# Patient Record
Sex: Female | Born: 1940
Health system: Southern US, Community
[De-identification: ages and names within clinical notes are randomized; demographics above are authoritative.]

## PROBLEM LIST (undated history)

## (undated) DIAGNOSIS — M7122 Synovial cyst of popliteal space [Baker], left knee: Secondary | ICD-10-CM

## (undated) DIAGNOSIS — I739 Peripheral vascular disease, unspecified: Secondary | ICD-10-CM

## (undated) DIAGNOSIS — Z872 Personal history of diseases of the skin and subcutaneous tissue: Secondary | ICD-10-CM

## (undated) DIAGNOSIS — I4891 Unspecified atrial fibrillation: Secondary | ICD-10-CM

## (undated) DIAGNOSIS — K219 Gastro-esophageal reflux disease without esophagitis: Secondary | ICD-10-CM

## (undated) DIAGNOSIS — R112 Nausea with vomiting, unspecified: Secondary | ICD-10-CM

## (undated) DIAGNOSIS — N189 Chronic kidney disease, unspecified: Secondary | ICD-10-CM

## (undated) DIAGNOSIS — Z87898 Personal history of other specified conditions: Secondary | ICD-10-CM

## (undated) DIAGNOSIS — K5900 Constipation, unspecified: Secondary | ICD-10-CM

## (undated) DIAGNOSIS — Z9889 Other specified postprocedural states: Secondary | ICD-10-CM

## (undated) DIAGNOSIS — M1990 Unspecified osteoarthritis, unspecified site: Secondary | ICD-10-CM

## (undated) DIAGNOSIS — I10 Essential (primary) hypertension: Secondary | ICD-10-CM

## (undated) DIAGNOSIS — F419 Anxiety disorder, unspecified: Secondary | ICD-10-CM

## (undated) DIAGNOSIS — R011 Cardiac murmur, unspecified: Secondary | ICD-10-CM

## (undated) DIAGNOSIS — Z8719 Personal history of other diseases of the digestive system: Secondary | ICD-10-CM

## (undated) DIAGNOSIS — K922 Gastrointestinal hemorrhage, unspecified: Secondary | ICD-10-CM

## (undated) DIAGNOSIS — D509 Iron deficiency anemia, unspecified: Secondary | ICD-10-CM

## (undated) DIAGNOSIS — I499 Cardiac arrhythmia, unspecified: Secondary | ICD-10-CM

## (undated) DIAGNOSIS — R911 Solitary pulmonary nodule: Secondary | ICD-10-CM

## (undated) HISTORY — PX: JOINT REPLACEMENT: SHX530

## (undated) HISTORY — PX: KNEE ARTHROSCOPY: SUR90

## (undated) HISTORY — PX: TUBAL LIGATION: SHX77

## (undated) HISTORY — PX: EYE SURGERY: SHX253

## (undated) HISTORY — PX: OTHER SURGICAL HISTORY: SHX169

## (undated) HISTORY — PX: INCONTINENCE SURGERY: SHX676

---
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
## 1975-08-29 HISTORY — PX: VAGINAL HYSTERECTOMY: SHX2639

## 1998-05-19 ENCOUNTER — Other Ambulatory Visit: Admission: RE | Admit: 1998-05-19 | Discharge: 1998-05-19 | Payer: Self-pay | Admitting: Obstetrics & Gynecology

## 1999-06-10 ENCOUNTER — Encounter: Admission: RE | Admit: 1999-06-10 | Discharge: 1999-06-10 | Payer: Self-pay | Admitting: Obstetrics and Gynecology

## 1999-12-06 ENCOUNTER — Ambulatory Visit (HOSPITAL_COMMUNITY): Admission: RE | Admit: 1999-12-06 | Discharge: 1999-12-06 | Payer: Self-pay | Admitting: Endocrinology

## 2000-06-13 ENCOUNTER — Encounter: Payer: Self-pay | Admitting: Obstetrics and Gynecology

## 2000-06-13 ENCOUNTER — Encounter: Admission: RE | Admit: 2000-06-13 | Discharge: 2000-06-13 | Payer: Self-pay | Admitting: Obstetrics and Gynecology

## 2000-07-02 ENCOUNTER — Other Ambulatory Visit: Admission: RE | Admit: 2000-07-02 | Discharge: 2000-07-02 | Payer: Self-pay | Admitting: Obstetrics and Gynecology

## 2001-06-17 ENCOUNTER — Encounter: Admission: RE | Admit: 2001-06-17 | Discharge: 2001-06-17 | Payer: Self-pay | Admitting: Obstetrics and Gynecology

## 2001-06-17 ENCOUNTER — Encounter: Payer: Self-pay | Admitting: Obstetrics and Gynecology

## 2001-07-05 ENCOUNTER — Other Ambulatory Visit: Admission: RE | Admit: 2001-07-05 | Discharge: 2001-07-05 | Payer: Self-pay | Admitting: Obstetrics and Gynecology

## 2001-12-24 ENCOUNTER — Encounter: Admission: RE | Admit: 2001-12-24 | Discharge: 2002-03-24 | Payer: Self-pay | Admitting: Family Medicine

## 2002-01-31 ENCOUNTER — Encounter: Payer: Self-pay | Admitting: Obstetrics and Gynecology

## 2002-01-31 ENCOUNTER — Encounter: Admission: RE | Admit: 2002-01-31 | Discharge: 2002-01-31 | Payer: Self-pay | Admitting: Obstetrics and Gynecology

## 2003-01-14 ENCOUNTER — Other Ambulatory Visit: Admission: RE | Admit: 2003-01-14 | Discharge: 2003-01-14 | Payer: Self-pay | Admitting: Obstetrics and Gynecology

## 2003-03-24 ENCOUNTER — Encounter: Payer: Self-pay | Admitting: Obstetrics and Gynecology

## 2003-03-24 ENCOUNTER — Encounter: Admission: RE | Admit: 2003-03-24 | Discharge: 2003-03-24 | Payer: Self-pay | Admitting: Obstetrics and Gynecology

## 2003-06-29 ENCOUNTER — Encounter (INDEPENDENT_AMBULATORY_CARE_PROVIDER_SITE_OTHER): Payer: Self-pay | Admitting: Specialist

## 2003-06-29 ENCOUNTER — Inpatient Hospital Stay (HOSPITAL_COMMUNITY): Admission: RE | Admit: 2003-06-29 | Discharge: 2003-07-01 | Payer: Self-pay | Admitting: Obstetrics and Gynecology

## 2003-07-02 ENCOUNTER — Inpatient Hospital Stay (HOSPITAL_COMMUNITY): Admission: AD | Admit: 2003-07-02 | Discharge: 2003-07-04 | Payer: Self-pay | Admitting: Obstetrics and Gynecology

## 2004-04-18 ENCOUNTER — Encounter: Admission: RE | Admit: 2004-04-18 | Discharge: 2004-04-18 | Payer: Self-pay | Admitting: Obstetrics and Gynecology

## 2004-05-24 ENCOUNTER — Encounter: Admission: RE | Admit: 2004-05-24 | Discharge: 2004-05-24 | Payer: Self-pay | Admitting: Family Medicine

## 2004-07-13 ENCOUNTER — Ambulatory Visit: Payer: Self-pay | Admitting: Internal Medicine

## 2004-07-14 ENCOUNTER — Ambulatory Visit: Payer: Self-pay | Admitting: Internal Medicine

## 2004-07-15 ENCOUNTER — Ambulatory Visit: Payer: Self-pay | Admitting: Internal Medicine

## 2004-08-15 ENCOUNTER — Ambulatory Visit: Payer: Self-pay | Admitting: Internal Medicine

## 2004-09-02 ENCOUNTER — Inpatient Hospital Stay (HOSPITAL_COMMUNITY): Admission: RE | Admit: 2004-09-02 | Discharge: 2004-09-06 | Payer: Self-pay | Admitting: Specialist

## 2004-09-02 ENCOUNTER — Ambulatory Visit: Payer: Self-pay | Admitting: Physical Medicine & Rehabilitation

## 2005-05-16 ENCOUNTER — Encounter: Admission: RE | Admit: 2005-05-16 | Discharge: 2005-05-16 | Payer: Self-pay | Admitting: Obstetrics and Gynecology

## 2005-08-10 ENCOUNTER — Ambulatory Visit (HOSPITAL_COMMUNITY): Admission: RE | Admit: 2005-08-10 | Discharge: 2005-08-10 | Payer: Self-pay | Admitting: Family Medicine

## 2005-10-02 ENCOUNTER — Ambulatory Visit: Payer: Self-pay | Admitting: Pulmonary Disease

## 2005-10-05 ENCOUNTER — Ambulatory Visit: Payer: Self-pay | Admitting: *Deleted

## 2005-10-11 ENCOUNTER — Ambulatory Visit: Admission: RE | Admit: 2005-10-11 | Discharge: 2005-10-11 | Payer: Self-pay | Admitting: Pulmonary Disease

## 2005-10-11 ENCOUNTER — Ambulatory Visit: Payer: Self-pay | Admitting: Acute Care

## 2005-11-27 ENCOUNTER — Ambulatory Visit: Payer: Self-pay | Admitting: Pulmonary Disease

## 2006-01-12 ENCOUNTER — Ambulatory Visit: Payer: Self-pay | Admitting: Pulmonary Disease

## 2006-06-08 ENCOUNTER — Ambulatory Visit: Payer: Self-pay | Admitting: Pulmonary Disease

## 2006-06-28 ENCOUNTER — Encounter: Admission: RE | Admit: 2006-06-28 | Discharge: 2006-06-28 | Payer: Self-pay | Admitting: Family Medicine

## 2006-12-19 ENCOUNTER — Encounter: Admission: RE | Admit: 2006-12-19 | Discharge: 2007-03-19 | Payer: Self-pay | Admitting: Family Medicine

## 2007-02-20 ENCOUNTER — Ambulatory Visit (HOSPITAL_COMMUNITY): Admission: RE | Admit: 2007-02-20 | Discharge: 2007-02-21 | Payer: Self-pay | Admitting: Orthopedic Surgery

## 2007-10-11 ENCOUNTER — Encounter: Admission: RE | Admit: 2007-10-11 | Discharge: 2007-10-11 | Payer: Self-pay | Admitting: Family Medicine

## 2007-10-24 ENCOUNTER — Encounter: Admission: RE | Admit: 2007-10-24 | Discharge: 2007-10-24 | Payer: Self-pay | Admitting: Family Medicine

## 2008-07-16 ENCOUNTER — Inpatient Hospital Stay (HOSPITAL_COMMUNITY): Admission: RE | Admit: 2008-07-16 | Discharge: 2008-07-17 | Payer: Self-pay | Admitting: Orthopedic Surgery

## 2008-08-10 ENCOUNTER — Inpatient Hospital Stay (HOSPITAL_COMMUNITY): Admission: EM | Admit: 2008-08-10 | Discharge: 2008-08-12 | Payer: Self-pay | Admitting: Emergency Medicine

## 2008-08-28 HISTORY — PX: HAND SURGERY: SHX662

## 2008-12-09 ENCOUNTER — Encounter: Admission: RE | Admit: 2008-12-09 | Discharge: 2008-12-09 | Payer: Self-pay | Admitting: Family Medicine

## 2010-03-11 ENCOUNTER — Encounter: Admission: RE | Admit: 2010-03-11 | Discharge: 2010-03-11 | Payer: Self-pay | Admitting: Internal Medicine

## 2010-07-26 ENCOUNTER — Ambulatory Visit (HOSPITAL_COMMUNITY)
Admission: RE | Admit: 2010-07-26 | Discharge: 2010-07-26 | Payer: Self-pay | Source: Home / Self Care | Admitting: Orthopedic Surgery

## 2010-08-04 ENCOUNTER — Ambulatory Visit (HOSPITAL_COMMUNITY)
Admission: RE | Admit: 2010-08-04 | Discharge: 2010-08-05 | Payer: Self-pay | Source: Home / Self Care | Attending: Orthopedic Surgery | Admitting: Orthopedic Surgery

## 2010-11-07 LAB — GLUCOSE, CAPILLARY
Glucose-Capillary: 111 mg/dL — ABNORMAL HIGH (ref 70–99)
Glucose-Capillary: 137 mg/dL — ABNORMAL HIGH (ref 70–99)
Glucose-Capillary: 178 mg/dL — ABNORMAL HIGH (ref 70–99)
Glucose-Capillary: 98 mg/dL (ref 70–99)

## 2010-11-08 LAB — BASIC METABOLIC PANEL
BUN: 17 mg/dL (ref 6–23)
Chloride: 101 mEq/L (ref 96–112)
Creatinine, Ser: 1.12 mg/dL (ref 0.4–1.2)
Glucose, Bld: 175 mg/dL — ABNORMAL HIGH (ref 70–99)
Potassium: 4.5 mEq/L (ref 3.5–5.1)

## 2010-11-08 LAB — CBC
HCT: 37.6 % (ref 36.0–46.0)
MCHC: 32.2 g/dL (ref 30.0–36.0)
MCV: 87 fL (ref 78.0–100.0)
Platelets: 256 10*3/uL (ref 150–400)
RDW: 13.5 % (ref 11.5–15.5)

## 2010-12-07 ENCOUNTER — Encounter (HOSPITAL_COMMUNITY)
Admission: RE | Admit: 2010-12-07 | Discharge: 2010-12-07 | Disposition: A | Discharge status: Home / Self Care | Payer: Medicare Other | Source: Ambulatory Visit | Attending: Orthopedic Surgery | Admitting: Orthopedic Surgery

## 2010-12-07 LAB — CBC
HCT: 38.4 % (ref 36.0–46.0)
MCH: 27.9 pg (ref 26.0–34.0)
MCHC: 32 g/dL (ref 30.0–36.0)
MCV: 87.1 fL (ref 78.0–100.0)
Platelets: 227 10*3/uL (ref 150–400)
RDW: 13.5 % (ref 11.5–15.5)

## 2010-12-07 LAB — BASIC METABOLIC PANEL
BUN: 21 mg/dL (ref 6–23)
Calcium: 9.7 mg/dL (ref 8.4–10.5)
Creatinine, Ser: 1.05 mg/dL (ref 0.4–1.2)
GFR calc non Af Amer: 52 mL/min — ABNORMAL LOW (ref >60)
Glucose, Bld: 206 mg/dL — ABNORMAL HIGH (ref 70–99)

## 2010-12-15 ENCOUNTER — Ambulatory Visit (HOSPITAL_COMMUNITY): Admission: RE | Admit: 2010-12-15 | Payer: Medicare Other | Source: Ambulatory Visit | Admitting: Orthopedic Surgery

## 2010-12-19 ENCOUNTER — Encounter (HOSPITAL_BASED_OUTPATIENT_CLINIC_OR_DEPARTMENT_OTHER)
Admission: RE | Admit: 2010-12-19 | Discharge: 2010-12-19 | Disposition: A | Discharge status: Home / Self Care | Payer: Medicare Other | Source: Ambulatory Visit | Attending: Orthopedic Surgery | Admitting: Orthopedic Surgery

## 2010-12-19 LAB — BASIC METABOLIC PANEL
BUN: 21 mg/dL (ref 6–23)
CO2: 28 mEq/L (ref 19–32)
Calcium: 9.1 mg/dL (ref 8.4–10.5)
Creatinine, Ser: 1.2 mg/dL (ref 0.4–1.2)
Glucose, Bld: 140 mg/dL — ABNORMAL HIGH (ref 70–99)
Sodium: 135 mEq/L (ref 135–145)

## 2010-12-21 ENCOUNTER — Ambulatory Visit (HOSPITAL_BASED_OUTPATIENT_CLINIC_OR_DEPARTMENT_OTHER)
Admission: RE | Admit: 2010-12-21 | Discharge: 2010-12-21 | Disposition: A | Discharge status: Home / Self Care | Payer: Medicare Other | Source: Ambulatory Visit | Attending: Orthopedic Surgery | Admitting: Orthopedic Surgery

## 2010-12-21 DIAGNOSIS — Z01812 Encounter for preprocedural laboratory examination: Secondary | ICD-10-CM | POA: Insufficient documentation

## 2010-12-21 DIAGNOSIS — G609 Hereditary and idiopathic neuropathy, unspecified: Secondary | ICD-10-CM | POA: Insufficient documentation

## 2010-12-21 DIAGNOSIS — M19049 Primary osteoarthritis, unspecified hand: Secondary | ICD-10-CM | POA: Insufficient documentation

## 2010-12-21 DIAGNOSIS — E119 Type 2 diabetes mellitus without complications: Secondary | ICD-10-CM | POA: Insufficient documentation

## 2010-12-21 DIAGNOSIS — I10 Essential (primary) hypertension: Secondary | ICD-10-CM | POA: Insufficient documentation

## 2010-12-21 LAB — GLUCOSE, CAPILLARY: Glucose-Capillary: 132 mg/dL — ABNORMAL HIGH (ref 70–99)

## 2011-01-03 NOTE — Op Note (Signed)
[name], ELLINGER NO.:  [id number]  MEDICAL RECORD NO.:  [id number]          PATIENT TYPE:  LOCATION:                                 FACILITY:  PHYSICIAN:  Nancy Done, MD  DATE OF BIRTH:  04/11/1941  DATE OF PROCEDURE:  12/21/2010 DATE OF DISCHARGE:                              OPERATIVE REPORT   PREOPERATIVE DIAGNOSIS:  Left ring finger end-stage osteoarthritis of the proximal interphalangeal joint.  POSTOPERATIVE DIAGNOSIS:  Left ring finger end-stage osteoarthritis of the proximal interphalangeal joint.  ATTENDING SURGEON:  Sharma Covert IV, MD who was scrubbed and present for the entire procedure.  ASSISTANT SURGEON:  None.  SURGICAL PROCEDURES: 1. Left ring finger proximal interphalangeal joint arthroplasty. 2. Radiographs, two views left index finger.  SURGICAL IMPLANT: NeuFlex size 1 PIP joint implant.  SURGICAL INDICATION:  Mr. [name] is a 70 year old right-hand dominant female with advanced osteoarthritis of her left ring finger.  The patient failed nonsurgical treatment options and elected to undergo the above procedure.  The patient has [name] well after her index finger arthroplasty.  Risks, benefits, and alternatives were discussed in detail with the patient and signed informed consent was obtained.  Risks include but not limited to bleeding; infection; damage to nearby nerves, arteries, or tendons; loss of motion of the wrist and digits; persistent pain and limitations of the finger; and need for further surgical intervention.  DESCRIPTION OF PROCEDURE:  The patient was properly identified in the preoperative holding area and marked with permanent marker made on left ring to indicate correct operative site.  The patient was then brought back to the operating room, placed supine on the anesthesia room table where general anesthesia was administered.  The patient tolerated this well.  Well-padded tourniquet was then placed  no the left brachium and sealed with 1000 drape.  Left upper extremity was then prepped and draped in normal sterile fashion.  Time-out was called, correct side was identified, and the procedure was then begun.  Attention was then turned to the left index finger where volar approach was then made to the finger.  A volar Brunner incision was made centered at the PIP joint. Radial and ulnar neurovascular bundles were identified.  Full-thickness skin flaps were identified and raised out the flexor sheath.  The flexor sheath was incised transversely just distal to the A2 pulley proximal to the fourth annular pulley, and reflected laterally.  A small drain was then used to retract the flexor tendons.  The palmar plane was then released from the proximal phalanx, and the collateral ligaments were then recessed from the proximal phalangeal attachment.  The joint was then opened with hyperextension exposing the joint surfaces.  The patient did have a large osteophytes off the middle phalanx and the dorsal region of the middle phalanx.  The patient had no articular cartilage of the proximal phalanx.  The patient also had surface defects along the volar margin of the middle phalanx.  An oscillating saw was then used to resect the proximal phalangeal condyle perpendicular to long axis of the bone removing a bundle off the implant.  Base  of the middle phalanx was not resected.  The intramedullary canal was then entered with an awl, and each were then enlarged with broaches to the appropriate size.  This was a size 1.  I could not pass anything more than the size 1.  Small bur and oscillating and a drill was then used to open up the canal.  This was very small and very tight canal.  The trial implant was then placed in active range of motion and stability were then assessed.  After placement of the trial implant, it was felt to be in good position.  A permanent plan was then carefully handled with  saline and no-touch technique, and radiographs were obtained to show the alignment.  The volar plate was then reapproximated with portions of the radial collateral ligament.  The tourniquet was then released.  This was repaired using a 4-0 Ethibond suture.  The wound was then thoroughly irrigated.  Hemostasis was obtained.  The skin was then closed using a 4-0 nylon suture.  A bulky dressing with a dorsal blocking splint were then applied.  Intraoperative radiographs, two views of the finger did show the surgical implant in place, and is in relatively good position in both planes.  POSTOPERATIVE PLAN:  The patient will be discharged home, seen back for first postop visit where gutter splint will be then fashioned.  Holding the digit in full extension, the lateral side of the splint will made higher on the radial side to prevent stress of the joint.  Active and passive range of motion excising will be performed several times a day. We will consider transitioning to a Dynamic flexion splint as her mobility improves.  Every week mark exercises are increased to provide mobility and gentle strengthening around 8 weeks, and radiographs at the first visit.     Nancy Done, MD     FWO/MEDQ  D:  12/21/2010  T:  12/22/2010  Job:  [id number]  Electronically Signed by Bradly Bienenstock IV MD on 01/03/2011 09:07:07 AM

## 2011-01-10 NOTE — H&P (Signed)
[name], [name] NO.:  [id number]   MEDICAL RECORD NO.:  [id number]          PATIENT TYPE:  OBV   LOCATION:  1823                         FACILITY:  [facility]   PHYSICIAN:  Ramiro Harvest, MD    DATE OF BIRTH:  [birth year]   DATE OF ADMISSION:  08/10/2008  DATE OF DISCHARGE:                              HISTORY & PHYSICAL   PRIMARY CARE PHYSICIAN:  Dr. Abigail Miyamoto of [facility].   HISTORY OF PRESENT ILLNESS:  [name] is a 70 year old white  female history of type 2 diabetes on insulin, hypertension,  hyperlipidemia, GERD, asthma, osteoarthritis with a neuropathy who  presents to the ED with altered mental status and nausea and agitation  per husband.  The patient's symptoms started around 10 p.m. the night  prior to admission with increasing agitation, pacing and confusion with  some hallucinations.  The patient endorses some headache, some  alternating diarrhea and constipation from time to time.  The patient  denied any fever,no chills, no chest pain, no shortness of breath, no  emesis, no paroxysmal nocturnal dyspnea, no orthopnea, no neck  stiffness, no dysuria, no cough, no facial droop, no slurred speech, no  melena, no hematemesis, no hematochezia, no focal neurological symptoms  per husband.  CBG prior to coming to the ED was 115.  In the ED, CBC  obtained showed a white count of 8.7, hemoglobin of 9.8, platelets of  257, hematocrit of 29.8, ANC of 5.4.  CMET with a potassium of 3.4,  otherwise was within normal limits.  UA was negative for urinary tract  infection.  Head CT had no definite acute findings.  EKG showed a normal  sinus rhythm, nonspecific T-wave abnormalities in V2 through V3.  UDS  was positive for benzos, although the patient does not have any benzos  on her list of medications.  We were called to admit the patient for  further evaluation and recommendation.  Per husband, the patient's  symptoms had improved symptomatically and  the patient was back to  baseline at the time of the interview.   ALLERGIES:  1. CODEINE causes a cough and nausea.  2. PREVACID causes itching and mouth swelling.  3. VASOTEC causes coughing and wheezing.   PAST MEDICAL HISTORY:  1. History of hypertension.  2. History of type 2 diabetes.  3. GERD.  4. Hyperlipidemia.  5. Asthma.  6. Osteoarthritis.  7. Neuropathy.  8. Neurogenic claudication secondary to lumbar spine stenosis L4-L5      status post X-STOP placement L4-L5 per Dr. Shon Baton February 20, 2007.  9. History of obstructive lung disease with a possible restriction.  10.End-stage osteoarthritis of the right knee status post TKA September 02, 2004, per Dr. Thomasena Edis.  11.Pelvic prolapse.  12.Stress urinary incontinence.  13.History of right adnexal mass.  14.Status post [facility] sling placement May 29, 2003, per Dr. Vernie Ammons.  15.Status post appendectomy 1952.  16.Status post hysterectomy 1977.  17.Fracture status post permanent implantation of spinal cord      stimulator battery on the left-hand side secondary to chronic back  and bilateral leg pain per Dr. Shon Baton July 16, 2008.   HOME MEDICATIONS:  1. Metoprolol 50 mg p.o. b.i.d.  2. Avapro 300 mg p.o. every morning.  3. Norvasc 10 mg p.o. every morning.  4. Triamterene/hydrochlorothiazide 75/50 p.o. every morning.  5. Doxazosin 4 mg p.o. nightly.  6. Humalog sliding scale.  7. Lantus 37 units nightly.  8. Elavil 50 mg nightly.  9. Omeprazole 20 mg p.o. b.i.d.  10.Pulmicort 200 mg 1 puff b.i.d.  11.Neurontin 200 mg three times a day.  12.Simvastatin 40 mg p.o. daily.  13.ProAir HFA as needed.  14.Symbicort 160/4.52  two inhalations b.i.d.   SOCIAL HISTORY:  The patient is married.  She is a retired  IT trainer.  No tobacco use.  No alcohol use.  No IV drug use.   FAMILY HISTORY:  Noncontributory.   REVIEW OF SYSTEMS:  As per HPI, otherwise negative.   PHYSICAL EXAM:  Temperature 98.5.  Blood  pressure 137/49.  Pulse of 93.  Respiratory rate 20.  Satting 96% on room air.  GENERAL:  The patient is sleeping, responded to verbal stimuli.  HEENT:  Normocephalic, atraumatic.  Pupils equal, round and reactive to  light and accommodation.  Extraocular movements intact.  Oropharynx is  clear.  No lesions, no exudates.  NECK:  No lymphadenopathy.  RESPIRATORY:  Lungs are clear to auscultation bilaterally.  No wheezes,  no crackles, no rhonchi.  CARDIOVASCULAR:  Regular rate rhythm.  No murmurs, rubs or gallops.  ABDOMEN:  Soft, nondistended.  Positive bowel sounds.  Some tenderness  to palpation in the suprapubic region.  EXTREMITIES:  No clubbing,  cyanosis or edema.  NEUROLOGICAL:  The patient is alert and oriented x3.  Cranial nerves II-  XII are grossly intact.  No focal deficits.  NEURO:  Negative Babinski.  Sensation is intact.  Visual fields are  intact.  Cerebellum is intact.  Two plus patellar reflexes bilaterally.  Unable to elicit Achilles or brachial radialis reflexes diffusely and  symmetrically.  Gait not tested, 5 out of 5 bilateral upper extremity  strength, 5 out of 5 bilateral lower extremity strength.   LABS:  CBC white count of 8.7, hemoglobin of 9.8, platelets of 257,  hematocrit of 29.8, ANC of 5.4.  CMET, sodium of 141, potassium of 3.4,  chloride of 110, bicarb of 25, BUN of 21, creatinine 1.18, glucose of  68, bilirubin less than 0.1, alk phosphatase 61, AST 19, ALT 17, total  protein 5.9, albumin 3.5, calcium of 8.9.  UDS was positive for benzos.  Urinalysis:  UA was yellow, cloudy, specific gravity 1.021, pH of 5,  glucose negative, bilirubin negative, ketones negative, blood negative,  protein negative, urobilinogen 0.2, nitrite negative, leukocytes  negative.  CT of the head showed no definite acute intracranial  findings.  EKG showed normal sinus rhythm with nonspecific T-wave  abnormalities in V2-V3.   ASSESSMENT AND PLAN:  [name] is a  70 year old female history of  type 2 diabetes, hypertension, hyperlipidemia, asthma, gastroesophageal  reflux disease presenting to the ED with altered mental  status/confusion.  1. Altered mental status/confusion, questionable etiology.      Differential is broad, includes drug-induced as the patient was      positive for benzos on the UDS, however, is not on her medication      list versus nonconvulsive seizures versus a CVA which is unlikely      with negative head CT, negative neurological exam versus an  infectious etiology which is a little unlikely with a normal white      count.  The patient is afebrile with a normal UA versus a cardiac      etiology versus endocrine versus pulmonary versus dementia.  We      will check a chest x-ray to rule out pneumonia.  Check a TSH.      Check an EEG, cycle cardiac enzymes every 8 hours times 3.  Check      an ABG to rule out hypercarbia, check an RBC folate, check an RPR,      check a B12 level, check a magnesium level.  Hydrate with IV fluids      and follow.  If worsening symptoms may consider MRI of the head and      neck and possibly a neurology versus a psych consult.  2. Hypokalemia.  Check a magnesium level and replete.  3. Type 2 diabetes.  Check a hemoglobin A1c.  Place on Lantus 30 units      daily, a sliding scale insulin.  4. Hypertension.  Continue home regimen.  5. Hyperlipidemia.  Zocor.  6. Anemia.  No overt gastrointestinal bleed.  We will check an anemia      panel and guaiac stools.  7. Asthma, stable.  Continue ProAir HFA and similar cord.  8. Neuropathy, Neurontin.  9. Gastroesophageal reflux disease, omeprazole.  10.Osteoarthritis.  11.Prophylaxis.  Protonix for gastrointestinal prophylaxis.      Sequential compression devices for deep vein thrombosis      prophylaxis.  It has been a pleasure taking care of Ms. Reuben Likes.      Ramiro Harvest, MD  Electronically Signed     DT/MEDQ  D:   08/10/2008  T:  08/10/2008  Job:  [id number]   cc:   Chales Salmon. Abigail Miyamoto, M.D.

## 2011-01-10 NOTE — Procedures (Signed)
EEG NUMBER:  [id number].   HISTORY:  The patient is a 70 year old with altered mental status.  Study is being done to look for presence of the etiology for it (293.0).   PROCEDURE:  The tracing was carried out on a 32-channel digital Cadwell  recorder reformatted into 16-channel montages with 1 devoted to EKG.  The patient was awake during the recording.  The International 10/20  system lead placement was used.   DESCRIPTION OF FINDINGS:  Dominant frequency is 10 Hz 20-microvolt  activity that is well regulated.  Background activity shows mixed  frequency theta and 3 Hz 30-microvolt delta range activity during  periods of drowsiness.  The patient also has positive occipital sharp  transients.   Photic stimulation induced a sustained driving response from 5-28 Hz.  There was no focal slowing.  There was no interictal epileptiform  activity in the form of spikes or sharp waves.  EKG showed regular sinus  rhythm with ventricular response of 75 beats per minute.   IMPRESSION:  Normal record with the patient awake and drowsy.      Deanna Artis. Sharene Skeans, M.D.  Electronically Signed     UXL:KGMW  D:  08/10/2008 16:48:59  T:  08/11/2008 06:12:02  Job #:  [id number]   cc:   Corinna L. Lendell Caprice, MD

## 2011-01-10 NOTE — Op Note (Signed)
NAMEJOANIE, [name]              ACCOUNT NO.:  [id number]   MEDICAL RECORD NO.:  [id number]          PATIENT TYPE:  OIB   LOCATION:  1533                         [facility]AC[facility]  [facility]   PHYSICIAN:  Alvy Beal, MD    DATE OF BIRTH:  1941-02-10   DATE OF PROCEDURE:  02/20/2007  DATE OF DISCHARGE:                               OPERATIVE REPORT   PREOPERATIVE DIAGNOSIS:  Neurogenic claudication secondary to lumbar  spine stenosis, L4-L5.   POSTOPERATIVE DIAGNOSIS:  Neurogenic claudication secondary to lumbar  spine stenosis, L4-L5.   OPERATIVE PROCEDURE:  X-Stop placement L4-L5.   COMPLICATIONS:  None.   CONDITION:  Stable.   HISTORY:  [name] is a very pleasant 70 year old woman who presents with  significant bilateral buttock and thigh pain.  Radiographic and clinical  analysis confirm the diagnosis of symptomatic spinal stenosis with  secondary neurogenic claudication.  After discussing treatment options,  we elected to proceed with surgery.  All attempts at pain control with  non-operative measures were tried and failed.  After discussing all  appropriate risks, benefits, and alternatives to surgery, she consented  to the aforementioned procedure.   OPERATIVE NARRATIVE:  The patient was brought to the operating room.  After successful induction of general anesthesia and endotracheal  intubation, TEDs, and SCDs were applied, and she was turned prone onto a  Wilson frame.  The arms were placed over her head.  All bony prominences  were well padded.  The back was then prepped and draped in a standard  fashion.   A needle was placed in the skin in the back to localize the incision.  We confirmed the L4-L5 level on fluoroscopy.  A 3 inch incision was made  in the midline and sharp dissection was carried out down to and through  the deep subcutaneous tissue.  We identified the deep fascia.  I  palpated the spinous process borders and incised along the lateral  borders.  I  sharply dissected with the Cobb along the lateral aspect of  the spinous processes, lamina, and out to the medial aspect of the facet  capsules.  This was done bilaterally.  I then placed a marker at the L4-  L5 interspinous process ligament.  I then confirmed that this was the L4-  L5 interspinous process ligament on x-ray.  I then used the trocar and  the X-Stop device to properly position it at the anterior aspect of the  interspinous process ligament.  I confirmed proper position in the AP  and lateral planes using fluoroscopy.  I then placed the dilating device  in and dilated up until the interspinous process ligament became taut.  This measured just beyond a size 10.  As such, I had a size 10 X-Stop  device opened and sterilely placed on a seal.  I then placed this  through the defect in the interspinous process ligament that I had  created and locked it into position.  I then clamped it and used the  final torquing screw driver to make sure it would stay in place.  Final  AP and lateral  x-rays demonstrated satisfactory position at the L4-L5  interspinous process space in both the AP and lateral planes.   I then copiously irrigated the wound with normal saline and then closed  the deep fascia with interrupted #1 Vicryl sutures, the superficial  tissues with 2-0 Vicryl sutures, and 3-0 nylon for the skin.  Steri-  Strips and a dry dressing were applied.  The patient was extubated and  transferred to the PACU without incident.  At the end of the case, all  needle and sponge counts were correct.  The patient will be admitted to  the hospital.  She will have PT/OT evaluation and more than likely will  be discharged in the morning.  She will follow up in the office in two  weeks at which time we will remove the sutures.      Alvy Beal, MD  Electronically Signed     DDB/MEDQ  D:  02/20/2007  T:  02/20/2007  Job:  [id number]

## 2011-01-10 NOTE — Op Note (Signed)
NAMEANALYA, [name]              ACCOUNT NO.:  [id number]   MEDICAL RECORD NO.:  [id number]          PATIENT TYPE:  AMB   LOCATION:  SDS                          FACILITY:  [facility]   PHYSICIAN:  Alvy Beal, MD    DATE OF BIRTH:  [birth year]   DATE OF PROCEDURE:  07/16/2008  DATE OF DISCHARGE:                               OPERATIVE REPORT   PREOPERATIVE DIAGNOSIS:  Chronic back and bilateral leg pain.   POSTOPERATIVE DIAGNOSIS:  Chronic back and bilateral leg pain.   OPERATIVE PROCEDURE:  Permanent implantation of spinal cord stimulator.  Battery was placed on the left-hand side.   COMPLICATIONS:  No intraoperative complications.   CONDITION:  Stable.   INSTRUMENTATION USED:  ANS dorsal column stimulator.   HISTORY:  This is a very pleasant 70 year old woman who has been having  significant back and buttock and leg pain for sometime now.  Attempts at  conservative management have failed to alleviate her symptoms and so she  elected to have a trial spinal cord stimulator placement.  The patient  improved significantly in terms of her function with the stimulator and  so she elected to proceed with the permanent implantation.  All  appropriate risks, benefits, alternatives were discussed and consent was  obtained.   OPERATIVE NOTE:  The patient was brought to the operating theater,  placed supine on the operating table.  After successful induction of  general anesthesia and endotracheal intubation, TEDs and SCDs were  applied.  She was turned prone onto a Wilson frame.  All bony  prominences were well-padded.  The back was prepped and draped in a  standard fashion.   An 18-gauge needles were used to identify the lumbar spine levels.  I  identified the L4-L2, L1 and T11 vertebral bodies, all counting up with  lateral fluoroscopy.  Once I identified the C10 lamina, I then made an  incision starting at the superior aspect of T10 spinous process and  proceeding down to the  T11 spinous process.  Sharp dissection was  carried out down to and through the deep subcutaneous tissue to expose  the underlying fascia.  The fascia was sharply incised, then exposed the  spinous process and lamina of T10 and a portion of that of T11.  I then  placed a Penfield four underneath the lamina of T10 and then went back  with lateral fluoroscopy to confirm once again that I was at the  appropriate level.  Once I counted up to second time from L5 confirming  I was at T10, I then used double-action rongeur to resect spinous  process at T10.  I used a microcurette to develop a plane underneath the  ligamentum flavum and then I did a partial laminotomy of T10 with a 3  and 2-mm Kerrison.  I then resected the ligamentum flavum and exposed  the underlying thecal sac.  At this point, with the thecal sac exposed,  I took the dural elevator, soaked it in mineral oil and I was able to  freely pass it up superiorly.  At this point, I took the  stimulator  lead, lightly soaked it in mineral oil to allow it to slide easier.  Because most of her pain was on the right side, I passed it up with a  preference towards the right T9 pedicle.  I advanced it all the way up,  so that it would span the T9 and T8 vertebral bodies as the trial did.  At this point, I took an x-ray confirming that it was at the appropriate  level and at the right side.  I then took a 2-0 Ethibond suture and  through a bone hole of the T11 spinous process, passed the stitch and  then put the locking sleeves over the leads and then secured them down  to the T11 spinous process with the #2 Ethibond suture.  Once it was  secured in place, I then connected it to the battery and tentatively  tested the battery to ensure the leads were properly positioned.  Once I  had confirmed this, I then made a second incision transversely over the  left gluteal region and dissected down 2.5 cm and created a pocket for  the battery.  I then  took the passer, passed it from the thoracic  incision submuscular down to the battery incision site.  I then placed  the second locking sleeves over the leads and then passed the leads  through the submuscular passer, so they would exit out at the battery  site.  I then took a 2.0  dia lead, so that it would take the stress and  tension off and then secure the disk down to the spinous process at T11  with a 2-0 FiberWire.  Once this was secured, I irrigated the wound  copiously with normal saline, closed the deep fascia with interrupted 2-  0 Vicryl sutures, superficial 2-0 Vicryl sutures and 3-0 Monocryl for  the skin.  I did take another set of x-rays confirming that during the  final locking and looping and locking of the leads that the paddle  itself did not migrate.  At this point, I secured it to the battery and  tested it again and both leads were working adequately.  I then packed  the battery into the space that I created and sutured it down with #1  Vicryl sutures.  I then irrigated that wound, closed it with a layered 2-  0 Vicryl stitch and a 3-0 Monocryl for the skin.  Steri-Strips, dry  dressing were applied.  The patient was extubated, transferred to PACU  without incident.  At the end of the case, all needle and sponge counts  were correct.      Alvy Beal, MD  Electronically Signed     DDB/MEDQ  D:  07/16/2008  T:  07/17/2008  Job:  [id number]

## 2011-01-13 NOTE — Op Note (Signed)
NAMEELYSE, PREVO NO.:  [id number]   MEDICAL RECORD NO.:  [id number]          PATIENT TYPE:  INP   LOCATION:  X001                         [facility]ACILITY[facility]  [facility]   PHYSICIAN:  Erasmo Leventhal, M.D.DATE OF BIRTH:  1941/07/24   DATE OF PROCEDURE:  DATE OF DISCHARGE:                                 OPERATIVE REPORT   Audio too short to transcribe (less than 5 seconds)      RAC/MEDQ  D:  09/02/2004  T:  09/02/2004  Job:  [id number]

## 2011-01-13 NOTE — Op Note (Signed)
NAMEGEARLDEAN, LOMANTO NO.:  [id number]   MEDICAL RECORD NO.:  [id number]          PATIENT TYPE:  INP   LOCATION:  X001                         FACILITY:  [facility]   PHYSICIAN:  Erasmo Leventhal, M.D.DATE OF BIRTH:  October 20, 1940   DATE OF PROCEDURE:  09/02/2004  DATE OF DISCHARGE:                                 OPERATIVE REPORT   PREOPERATIVE DIAGNOSIS:  Right knee osteoarthritis.   POSTOPERATIVE DIAGNOSIS:  Right knee osteoarthritis.   PROCEDURE:  Right total knee arthroplasty.   SURGEON:  Erasmo Leventhal, M.D.   ASSISTANT:  Jodene Nam, P.A.-C.   ANESTHESIA:  General with femoral nerve block.   ESTIMATED BLOOD LOSS:  Less than 100 cc.   DRAINS:  Two Hemovac.   COMPLICATIONS:  None.   TOURNIQUET TIME:  An hour and 30 minutes at 350 mmHg.   OPERATIVE IMPLANTS:  Armed forces logistics/support/administrative officer implant. Posterior  stabilizer. Size 5 femur, size 5 tibia, 12-mm posterior stabilized flexed  tibial insert with a 26-mm patella.   OPERATIVE DETAILS:  The patient counseled in the holding area, chart was  reviewed, and signed appropriately. IV started, antibiotics were given.  __________ with a general anesthetic was administered. Foley catheter was  placed utilizing the sterile technique by the OR circulating nurse. All  extremities well padded. Right knee was examined. She had about a 3 degree  flexion contracture. She could flex to 130 degrees. She was elevated,  prepped with Duraprep, and draped in a sterile fashion. Exsanguinated with  an Esmarch tourniquet inflated to 350 mmHg. A standard straight midline  incision made through the skin and subcutaneous tissue.  Medial soft tissue  flaps developed at the appropriate level. Small vessels were  electrocoagulated. Medial parapatellar arthrotomy was performed. Patella was  everted, and knee was placed. There was end-stage arthritic change, bone  against bone. Cruciate ligaments were resected.  A starter hole was made in  the distal femur. Canal irrigated. The effluent was clear. An intramedullary  rod was gently placed which shows a 5-degree valgus cut with a 10-mm cut off  the distal femur. Distal femur was found to be a size 5. Rotational marks  were made, and the cut fit a size #5. Medial and lateral meniscus were  removed under direct visualization. Geniculate vessels were coagulated.  Posterior neurovascular structures were sought out and protected throughout  the entire case. Proximal tibia was found to be a size 5. Starting hole was  made. Step reamer was utilized. Canal was irrigated. Effluent was clear. An  intramedullary rod was gently placed. Chose a 0-degree slope with a 10-mm  cut based upon the medial side. Proximal tibia was cut to appropriate level.  Posterior medial, posterior femoral bone was removed under direct  visualization.  Femoral trochlea was prepared in standard fashion, at this  time with a size 5 femur, size 5 tibia with a 10-mm posterior stabilized  flexed insert with excellent range of motion and soft tissue balance in  flexion and extension. Rotational marks were made. The Delta keel was  performed in standard fashion. Patella was found  to be a size 26. It was  found to be sufficient in thickness after measuring it. It was reamed to  appropriate depth, and locking holes were made. Knee was copiously irrigated  with pulsatile lavage at this point in time. Utilizing a Moder and cement  technique, all components were cemented into place. Size 5 tibia, size 5  femur, 26 patella. After the cement had cured, we did tibial insert trials  of 10 and 12 mm thick, and at this time with a 12-mm thick posterior  stabilized flexed tibial insert, we had excellent alignment, flexion and  extension gaps, excellent range of motion. Patellofemoral tracking was  anatomic. Did not require a lateral release, and she was well balanced. This  trial was then removed.  Excess cement was removed. A final 12-mm posterior  stabilized flexed tibial insert was implanted. Bone wax was then placed on  exposed bony surfaces. Everything was checked. There were no problems or  complications. Again knee joint was irrigated as was soft tissues during  each stage of closure. Two medium Hemovac drains were placed, and sequential  closure of layers was done, arthrotomy Vicryl, subcu Vicryl, skin closed  subcuticular Monocryl suture. Xeroform placed around the drain. Sterile  compressive dressing was applied. Tourniquet was deflated. Another gram of  Ancef given intravenously as tourniquet deflated. At this time, she had  excellent circulation of the foot and ankle at the end of the case.   Following this, Dr. Shireen Quan performed a femoral nerve block. The patient  was then awakened. She left the operating room to PACU in stable condition.  Sponge and needle are correct. No complications or problems.   To decrease surgical time, during surgical procedure, Mr. Viviann Spare Chabon's  assistance was needed.      RAC/MEDQ  D:  09/02/2004  T:  09/02/2004  Job:  [id number]

## 2011-01-13 NOTE — Discharge Summary (Signed)
NAMEMYKELL, GENAO NO.:  [id number]   MEDICAL RECORD NO.:  [id number]          PATIENT TYPE:  INP   LOCATION:  0452                         FACILITY:  [facility]   PHYSICIAN:  Erasmo Leventhal, M.D.DATE OF BIRTH:  09-02-1940   DATE OF ADMISSION:  09/02/2004  DATE OF DISCHARGE:  09/06/2004                                 DISCHARGE SUMMARY   ADMISSION DIAGNOSIS:  End-stage osteoarthritis, right knee.   POSTOPERATIVE DIAGNOSIS:  End-stage osteoarthritis, right knee.   OPERATION:  Total knee arthroplasty, right knee.   BRIEF HISTORY:  This is a 70 year old lady with a long history of right knee  pain that has failed conservative management of injections and arthroscopy.  After discussion of treatment options, the patient is scheduled for total  knee arthroplasty of the right knee.  Surgical risks, benefits and aftercare  was discussed with the patient.  Questions were provided and answered.  Surgery is scheduled.  She is to receive medical clearance and will be  followed in the hospital by medical physicians for management of her  diabetes and hypertension.   LABORATORY VALUES:  Admission CBC within normal limits.  Hemoglobin and  hematocrit reached a low of 9.8 and 29.4 on the 9th.  Admission PT/PTT  within normal limits, except that the PTT was mildly elevated at 38.  Was  back to normal by discharge.  By discharge, her INR was 2.0 with a PT of  19.5.  Admission chemistries within normal limits with the exception of  glucose high at 104.  She did have problems with elevated glucose throughout  her admission, which was managed by the medical physicians.   HOSPITAL COURSE:  The patient tolerated the operative procedure well.  On  the first postoperative day, she tolerated the operative procedure well.  Her drain was DC'd without difficulty.  Vital signs were stable.  She was  afebrile.  Hemoglobin and hematocrit were acceptable.  She was started in  CPM.   On the second postoperative day, vital signs were stable.  She was afebrile.  She had mild nausea.  Hemoglobin and hematocrit were acceptable.  Abdomen  was soft.  Neurovascular status intact.  Calves were negative.  Dressing was  changed.  The wound was benign.  Foley was DC'd.  PCA was DC'd.  Percocet  was initiated.  She was continued on her CPM.  Medical team managed her  diabetes and hypertension.   On the third postoperative day, she was feeling okay.  Mild pain was noted.  Vital signs were stable.  She was afebrile.  Temperature was to a maximum of  99.7.  On the previous day, hemoglobin and hematocrit were acceptable.  Lungs were clear.  Bowel sounds were active.  Calves were negative.  The  wound was benign.  Due to the fact that she was not therapeutic on her  PT/INR, Lovenox 40 subcu q.d. was added until her INR was greater than 2.0.   On the fifth postoperative day, vital signs were stable.  She had some mild  nausea without vomiting.  Knee looked great.  Discussed with  the patient  discharge home the following day.   On September 06, 2004, vital signs were stable.  She was afebrile.  Calf was  mildly tender, so a Doppler was maintained and was within normal limits.  Did not show evidence of DVT.  Subsequently, with clearance from the medical  doctor, she was subsequently discharged home with followup in the office.  On discharge, her INR was 2.0.  Her Lovenox was DC'd.  She was to follow up  in the office.   CONDITION ON DISCHARGE:  Improved.   DISCHARGE MEDICATIONS:  1.  Percocet 5/325 1-2 q.4-6h. p.r.n. pain.  2.  Robaxin 500 1 p.o. q.8h. p.r.n. spasm.  3.  Coumadin per pharmacy protocol.  4.  Trinsicon 1 b.i.d.  5.  Resume regular home medications.   DISCHARGE INSTRUCTIONS:  1.  Do CPM daily.  2.  Do her home physical therapy.  3.  Call if problems or questions arise.  4.  She will call the office today for an appointment with [place] in seven days.      SJC/MEDQ   D:  10/05/2004  T:  10/05/2004  Job:  [id number]

## 2011-01-13 NOTE — Discharge Summary (Signed)
[name], [name]                        ACCOUNT NO.:  [id number]   MEDICAL RECORD NO.:  [id number]                   PATIENT TYPE:  INP   LOCATION:  9326                                 FACILITY:  [facility]   PHYSICIAN:  Huel Cote, M.D.              DATE OF BIRTH:  July 08, 1941   DATE OF ADMISSION:  07/02/2003  DATE OF DISCHARGE:  07/04/2003                                 DISCHARGE SUMMARY   DISCHARGE DIAGNOSES:  1. Nausea and vomiting postoperatively.  2. Status post laparoscopic left salpingo-oophorectomy resection of right     hydrosalpinx and anterior posterior repair and sacrospinous ligament     suspension on June 29, 2003.  3. Diabetes.  4. Chronic hypertension.   DISCHARGE MEDICATIONS:  1. Toradol 10 mg p.o. every 4 to 6 hours p.r.n.  2. Zofran 8 mg p.o. b.i.d. p.r.n.  3. Insulin 75/25 mix, she gets 30 units in the morning and 22 units at     night.  4. Toprol b.i.d.  5. Norvasc as per preoperative dosing.   FOLLOW UP:  The patient is to followup with Dr. Abigail Miyamoto for her blood sugar  control in the next week and in our office in a week and a half for  postoperative check.   HOSPITAL COURSE:  The patient is a 70 year old female who had undergone  surgery on June 29, 2003 with multiple procedures including a  laparoscopic left salpingo-oophorectomy, resection of a right hydrosalpinx  and anterior posterior repair, __________  placement by Dr. Vernie Ammons, and a  sacrospinous ligament suspension. She was discharged to home as she was  doing well on July 01, 2003 and when reaching home, began to have  increasing nausea and emesis with significant back pain at her right lower  buttock. The patient was unable to tolerated her pain medication secondary  to nausea and vomiting.   PAST MEDICAL HISTORY:  Significant for diabetes and chronic hypertension.   GYNECOLOGIC HISTORY:  Significant for pelvic prolapse and stress urinary  incontinence. She had had a  history of a hysterectomy many years ago and an  anterior posterior repair subsequent to that.   PHYSICAL EXAMINATION:  VITAL SIGNS:  Temperature 99.3, pulse 108,  respiratory rate 16, and blood pressure 153/66.  GENERAL:  The patient looked quite ill with significant pain secondary to  having no pain medications and was quite nauseated on arrival.  HEART:  Regular rhythm with a slightly tachycardiac rate.  BREAST:  Examination normal.  BACK:  The patient did have significant pain consistent with her  sacrospinous suspension in her right buttock at the lower level.  ABDOMEN:  Soft and nondistended. It was appropriately tender at the  incisions which were clear.  PELVIC:  Examination was normal with scant vaginal discharge noted.   LABORATORY DATA:  She had labs on arrival and hemoglobin was completely  stable at 10.7. White count was lower than at discharge  at 10.2. Her blood  sugar was 176. Otherwise, her chemistries were all within normal limits.   She had a flat and upright abdominal film which showed no obstruction or  significant ileus.   HOSPITAL COURSE:  Therefore, she was admitted for hydration and IV pain  medications given she could not tolerate those by mouth. Possible diagnosis  of gastroparesis from her diabetes or just simple nausea and vomiting  postoperatively, as she did not appear to have a significant ileus. She was  passing some flatus on arrival. The patient was admitted and placed on IV  hydration and nausea medication and pain medication and began to feel much  better within several hours of admission. She was retained in house to  ensure that she could tolerate a regular diet and by hospital day 3, she  tolerated a regular diet for breakfast and had minimal nausea. She did have  occasional nausea the very early morning hours which responded to IV Zofran.  However, had no emesis since admission. She had good bowel sounds and had a  loose stool prior to  discharge. Her blood sugars were not optimally  controlled. However, given her off and on intake, it was difficult to give  standing insulin orders. The patient upon discharge, had a postprandial  breakfast value of 224. However, had received her full insulin coverage  prior to that with her 75/25 mix and was going to continue to do her blood  sugars every 3 to 4 hours at home and call if they remained over 200. She  was quite anxious for discharge and felt much better. Her pain was  controlled and therefore, she was considered stable for discharge and was to  followup in the office in approximately a week and a half to call Dr.  Abigail Miyamoto, her medical doctor, for further instructions on her diabetes  management post discharge.                                               Huel Cote, M.D.    KR/MEDQ  D:  07/04/2003  T:  07/04/2003  Job:  [id number]   cc:   Veverly Fells. Vernie Ammons, M.D.  [address]  [place]  [place] [address]  Fax: [phone]

## 2011-01-13 NOTE — H&P (Signed)
[name], [name] NO.:  [id number]   MEDICAL RECORD NO.:  [id number]           PATIENT TYPE:   LOCATION:                                 FACILITY:   PHYSICIAN:  Erasmo Leventhal, M.D.DATE OF BIRTH:  August 15, 1941   DATE OF ADMISSION:  09/02/2004  DATE OF DISCHARGE:                                HISTORY & PHYSICAL   CHIEF COMPLAINT:  Right knee end-stage osteoarthritis.   HISTORY OF PRESENT ILLNESS:  This is a 70 year old lady with a long history  of right knee pain that has failed conservative management of injections and  arthroscopy.  We have discussed the surgery which would be total knee  arthroplasty, the risks and benefits including infection, phlebitis with PE,  stiffness, loosening of the prosthesis were discussed in detail with the  patient.  She understands the risks and benefits of the surgery and opts to  go ahead.  She has had medical clearance by her medical doctor, Dr. Abigail [name],  at this time and will be followed in the hospital by the medical doctors to  manage her diabetes and hypertension.   PAST MEDICAL HISTORY:  Drug allergies to VASOTEC and PREVACID.   CURRENT MEDICATIONS:  1.  Metoprolol 100 mg b.i.d.  2.  Avapro 300 mg q.a.m.  3.  Norvasc 10 mg q.a.m.  4.  Triamterene hydrochlorothiazide 75/50, 1 q.a.m.  5.  Doxazosin 2 mg q.h.s.  6.  Premarin 0.3 mg q.a.m.  7.  Humalog sliding-scale.  8.  Lantus 30 units q.h.s.  9.  Elavil 50 mg q.h.s.  10. Nexium 40 mg b.i.d.  11. Pulmicort 200 mg 1 puff b.i.d.  12. Neurontin 200 mg q.i.d.  13. Vytorin 10/20 mg daily.  14. Zelnorm 6 mg b.i.d.   PREVIOUS SURGERY:  1.  Appendectomy.  2.  Hysterectomy.  3.  Knee arthroscopy.   SERIOUS MEDICAL ILLNESSES:  1.  Hypertension.  2.  Diabetes.  3.  Reflux.  4.  Hypercholesterolemia.  5.  Asthma.   FAMILY HISTORY:  Positive for diabetes, cancer, and osteoarthritis.   SOCIAL HISTORY:  The patient is married.  She is retired.  She does  not  smoke or drink.   REVIEW OF SYSTEMS:  CNS:  Negative for headache, blurred vision, or  dizziness.  PULMONARY:  Positive for asthma.  Negative for shortness of  breath, PND, and orthopnea.  CARDIOVASCULAR:  Positive for hypertension.  Negative for chest pain or palpitation.  GI:  Positive for GERD with a  questionable history of ulcers but no documented ulcers.  GU:  Positive for  urinary incontinence.  MUSCULOSKELETAL:  Positive in HPI.   PHYSICAL EXAMINATION:  VITAL SIGNS:  BP 116/60, respirations 18, pulse 72  and regular.  GENERAL APPEARANCE:  This is a well-developed, well-nourished lady in no  acute distress.  HEENT:  Head normocephalic.  Nose patent, ears patent.  Pupils equal, round,  and reactive to light.  Throat without injection.  NECK:  Supple without adenopathy.  Carotids 2+ without bruit.  CHEST:  Clear to auscultation.  No rales or rhonchi.  Respirations 18.  HEART:  Regular rate and rhythm at 72 beats per minute without murmur.  ABDOMEN:  Soft with active bowel sounds.  No mass or organomegaly.  NEUROLOGIC:  Patient alert and oriented to time, place, and person.  Cranial  nerves 2-12 grossly intact.  EXTREMITIES:  The right knee with a valgus deformity, 0-135 degree range of  motion with crepitation.  Dorsalis pedis pulse and tibialis pulses are 2+.  Sensation and circulation are normal.  X-rays show end-stage osteoarthritis,  right knee.   IMPRESSION:  End-stage osteoarthritis, right knee.   PLAN:  Total knee arthroplasty, right knee.      ________________________________________  Jaquelyn Bitter. Chabon, P.A.  ___________________________________________  Erasmo Leventhal, M.D.    SJC/MEDQ  D:  08/17/2004  T:  08/17/2004  Job:  [id number]

## 2011-01-13 NOTE — Op Note (Signed)
[name], [name]                        ACCOUNT NO.:  [id number]   MEDICAL RECORD NO.:  [id number]                   PATIENT TYPE:  INP   LOCATION:  0010                                 FACILITY:  [facility]   PHYSICIAN:  Huel Cote, M.D.              DATE OF BIRTH:  08-14-1941   DATE OF PROCEDURE:  06/29/2003  DATE OF DISCHARGE:                                 OPERATIVE REPORT   PREOPERATIVE DIAGNOSES:  1. Complex right adnexa mass.  2. Vaginal vault prolapse with cystocele, rectocele and enterocele as well.  3. Stress urinary incontinence.   POSTOPERATIVE DIAGNOSES:  1. Complex right adnexa mass.  2. Vaginal vault prolapse with cystocele, rectocele and enterocele as well.  3. Stress urinary incontinence.  4. Right hydrosalpinx.  5. Normal left ovary and tube.   SURGEONS:  Huel Cote, M.D., Zenaida Niece, M.D.,  Veverly Fells. Vernie Ammons, M.D. with Susanne Borders, M.D.   ANESTHESIA:  General.   ESTIMATED BLOOD LOSS:  200 mL.   URINE OUTPUT:  Approximately 150 mL of slight blood tinged urine.   FINDINGS:  1. Left ovary and tube were intact and normal in appearance.  2. The right hydrosalpinx was noted and no right ovary was identified.  3. There is a very scarred anterior vaginal wall with vaginal vault prolapse     as well as an enterocele noted.   DESCRIPTION OF PROCEDURE:  The patient was taken to the operating room where  general anesthesia was obtained without difficulty.  She was then prepped  and draped in the normal sterile fashion in the dorsal lithotomy position.  A Foley catheter was placed serially and attention was turned to the  patient's abdomen where infraumbilical incision was made with the scalpel  and the Veress needle introduced peritoneally.  Peritoneal placement was  confirmed with injection of normal saline and gas flow was applied to the  Veress needle with pneumoperitoneum obtained with approximately 3 L of CO2  gas.  The Veress needle was  then removed and the 10/11 trocar easily placed  through the umbilicus.  The camera was introduced into the trocar, and the  pelvis and abdomen inspected with a right hydrosalpinx clearly visualized  and mostly mobile.  The right lower quadrant were then injected with 20.5%  Marcaine and 5 mm trocars were placed in each lower quadrant under direct  visualization with no bleeding noted.  The right hydrosalpinx was then  grasped with an atraumatic grasper and the tripolar cutting cautery unit  placed within the right lower quadrant.  The remaining infundibulopelvic  stalk was then taken down with the cutting cautery unit and the hydrosalpinx  removed from the remaining infundibulopelvic ligament and the sidewall and  cuff without difficulty.  The left ovary and tube were then noted to be  present although the patient had thought that these had been previously  removed with her hysterectomy.  They were,  therefore, also grasped with an  atraumatic grasper, the infundibulopelvic ligament cauterized with a  tripolar unit and transected.  These were easily removed in their entirety  and all content.  The left ovary and tube as well as the right hydrosalpinx  were then placed into an Endobag which was introduced through the 10/11 port  with a 5 mm camera used to visualize this.  The Endobag was then removed  through the umbilicus and the specimen sent to pathology.  The fascial layer  was then closed with a pursestring suture of 0 Vicryl at the 10/11 port  site, given that it had to be slightly dilated to remove the Endobag.  With  this closed, the other 5 mm ports were removed without difficulty under  direct visualization and no active bleeding noted.  The pneumoperitoneum was  reduced through the 5 mm port and the skin of all ports was closed with 3-0  Vicryl subcuticular stitch.   With this portion of the procedure completed, attention was then turned to  the vagina.   With the patient in  the dorsal lithotomy position with legs elevated, a  weighted speculum was then placed in the vagina.  The vaginal cuff was  identified and grasped with two Allis clamps.  It was noted that the vaginal  cuff itself was not very well supported.  However, there was an extremely  short distance between the vaginal cuff and the urethral meatus.  For this  reason, small portion of the mucosa was removed from between the Allis  clamps and the vaginal cuff.  This was then underscored and with some  difficulty, dissected free from the underlying pubovesical fascia. There was  extensive scarring noted in this plane and it was not easily dissected.  However, with some success, the very small cystocele was reduced in the  midline.   At this point, Dr. Ihor Gully was called and placed the transobturator  suburethral sling.  Please see his dictated operative report for details of  this procedure.   When his procedure was completed, attention was returned to the vaginal  mucosa which was enclosed with 0 Vicryl in a running fashion after a small  amount of excess mucosa was trimmed away.  One small buttonhole in the  vaginal mucosa was also closed with 0 Vicryl and a figure-of-eight sutures  which had occurred during the transobturator sling placement.  With the  anterior portion of surgery completed, attention was then turned posteriorly  where at the introitus, the vaginal mucosa was grasped with two Allis  clamps.  A small amount of vaginal mucosa was trimmed away with Mayo  scissors and Metzenbaum scissors were used to underscore the vaginal mucosa  up the midline of the posterior vaginal wall to approximately even with  vaginal cuff.  The Metzenbaum scissors were used to sharply dissect away the  rectovaginal fascia from the vaginal mucosa flap and retract the rectocele  to the midline.  There was a minimal rectocele noted, however, and moderate size enterocele was encountered and the enterocele  bag itself was dissected  out from the vaginal mucosa.  The enterocele was then closed in a  pursestring fashion with 2-0 Vicryl and the excess enterocele was trimmed  away with Mayo scissors.  With this closed, the remaining rectocele was  nicely dissected away and attention was then turned to the patient's right  perirectal area which was dissected down to the sacrospinous ligament.  At  this point the  sacrospinous ligament was identified and grasped with an  Allis clamp by Dr. Lavina Hamman.  With the Brisky retractors in place,  this was visualized well and two sutures of #1 Prolene were passed through  the sacrospinous ligament.  With a pre-Mayo needle, these were secured to  the vaginal cuff and a pulley stitch placed as well as the actual  sacrospinous stitch itself.  These were held in hemostats and the remaining  rectocele just very small and distal was closed with interrupted sutures of  0 Vicryl, reapproximating the rectovaginal fascia over this area.  Rectal  exam was performed and there were no stitches palpated in the rectum.  Therefore, 2-0 Vicryl was utilized in a running locked suture to begin the  close the rectovaginal mucosa.  Before this closure was complete,  sacrospinous ligament suspension sutures were tied down, first the pulley  suture and then actual sacrospinous suspension itself was secured.  There  was a good retraction of vaginal vault noted at this point and the excess  sutures were trimmed away.  The remaining rectal mucosa was then  continuously closed with running Vicryl suture of 2-0 Vicryl.   At the conclusion of the procedure, there was no active bleeding noted.  The  vagina was packed with an Estrace-coated gauze or some pressure on the  sacrospinous area and had been sutured.  The urine was noted to be slightly  blood-tinged but was of little concern given that Dr. Vernie Ammons had performed  a post sling cystoscopy with the bladder noted to be not  traumatized.   At this point the patient was awakened.  All instruments and sponges were  counted and found to be correct x2.  The patient was taken to the recovery  room in stable condition.                                               Huel Cote, M.D.    KR/MEDQ  D:  06/29/2003  T:  06/29/2003  Job:  [id number]

## 2011-01-13 NOTE — H&P (Signed)
NAMESHARICKA, [name]                        ACCOUNT NO.:  [id number]   MEDICAL RECORD NO.:  [id number]                   PATIENT TYPE:  INP   LOCATION:  NA                                   FACILITY:  [facility]   PHYSICIAN:  Huel Cote, M.D.              DATE OF BIRTH:  1940/08/30   DATE OF ADMISSION:  06/29/2003  DATE OF DISCHARGE:                                HISTORY & PHYSICAL   PREOPERATIVE HISTORY AND PHYSICAL.   DATE OF SURGERY:  June 29, 2003 at [facility] at 12:30 p.m.   HISTORY OF PRESENT ILLNESS:  The patient is a 70 year old G2 P2 who is  admitted for an extensive surgery including anterior and posterior repair,  sacrospinous ligament suspension and laparoscopic resection of an adnexal  mass, possible minilaparotomy.  She will also, at the same time, have a  [place]parks sling placed by Dr. Ihor Gully.  The patient has noticed increasing  symptoms with pelvic prolapse including cystocele and cuff prolapse.  She  has a grade 3 cystocele with Valsalva and has significant stress urinary  incontinence leaking at least a tablespoon daily and wears a pad daily.  She  did have urodynamics performed prior to surgery and Dr. Vernie Ammons confirmed  that she is a candidate for a sling procedure given her stress urinary  incontinence.  This was extensively evaluated given a history of diabetes  and a small bladder capacity.   PAST MEDICAL HISTORY:  Her past medical history is significant for chronic  hypertension and diabetes.  Both are very well controlled on medication.  Her last hemoglobin A1c was 6.8.  She sees Dr. Abigail Miyamoto for her diabetes  management.   PAST SURGICAL HISTORY:  A total vaginal hysterectomy and anterior/posterior  repair with a left oophorectomy and appendectomy.   PAST OBSTETRICAL HISTORY:  Normal spontaneous vaginal delivery x2.   PAST GYNECOLOGICAL HISTORY:  No abnormal Pap smears.   FAMILY HISTORY:  There is no breast cancer, colon cancer,  or heart disease.   CURRENT MEDICATIONS:  1. Avapro.  2. Norvasc.  3. Maxzide.  4. Toprol.  5. Insulin 75/25 and amitriptyline.   ALLERGIES:  PREVACID, VASOTEC, and CODEINE.   During her workup for her urinary symptoms Dr. Ihor Gully noticed a  possible cystic lesion in her pelvis.  For that recent she underwent an  ultrasound evaluation and was noted to have an 11 x 2 x 2.9 cm structure  consistent with a right hydrosalpinx.  This is asymptomatic currently;  however, slightly complex in nature; and, though is likely a hydrosalpinx,  cannot rule out any adnexal pathology and for this reason the patient will  also have a laparoscopic removal of this pelvic mass with a possible  minilaparotomy should it prove difficult to remove laparoscopically.   PHYSICAL EXAMINATION:  VITAL SIGNS:  Blood pressure is 130/80, weight is 156  pounds.  Hemoglobin is 12.7.  BREASTS:  Exam has no masses, discharge, or adenopathy noted.  CARDIOVASCULAR:  Cardiac exam is regular rate and rhythm.  LUNGS:  Lungs are clear.  ABDOMEN:  Abdomen is soft and nontender.  PELVIC:  Exam shows normal genitalia noted except for the grade 3 cystocele  noted with Valsalva and a mild cuff prolapse.  She also has a mild rectocele  but more significant is the cuff prolapse.   The risks and benefits of the procedure were discussed with the patient, in  detail: Including bleeding and infection and possible damage to bowel and  bladder. The patient understands these risks.  She also understands the  possibility that her vagina could be deviated slightly to the right or left  given probable need for a sacrospinous ligament fixation.  The patient  understands all these risks and also the risks of laparoscopic procedure  including bleeding, infection, and possible need to proceed with a  minilaparotomy.  Given the risks and benefits, the patient desires to  proceed with surgery as stated.  She is to see anesthesia  preoperatively and  will be on the Glucommander protocol for her diabetes.                                               Huel Cote, M.D.    KR/MEDQ  D:  06/26/2003  T:  06/26/2003  Job:  [id number]

## 2011-01-13 NOTE — Op Note (Signed)
[name], [name]              ACCOUNT NO.:  [id number]   MEDICAL RECORD NO.:  [id number]          PATIENT TYPE:  OUT   LOCATION:  CARD                         FACILITY:  [facility]   PHYSICIAN:  Oley Balm. Sung Amabile, M.D. Romualdo Bolk OF BIRTH:  March 02, 1941   DATE OF PROCEDURE:  DATE OF DISCHARGE:  10/11/2005                                 OPERATIVE REPORT   REQUESTING PHYSICIAN:  Danice Goltz, M.D. [facility].   INDICATION FOR TESTING:  Exertional dyspnea.   PROCEDURE:  Cardiopulmonary stress testing was performed on a graded  treadmill. Testing was stopped due to fatigue. Effort was submaximal. At  peak exercise oxygen uptake was 12 mL/kg/minute or 49% of predicted maximum  indicating moderate-to-severe exercise impairment.   At peak exercise, heart rate was 96 beats/minute or 61% of predicted maximum  indicating that cardiovascular reserve remained. Oxygen pulse was normal  suggesting normal stroke volume. Blood pressure response was abnormal--see  below. EKG tracings revealed no definite ischemia and no arrhythmias.   At peak exercise minute ventilation was 45.7 L/minute or 91% of maximum  voluntary ventilation indicating that ventilatory limitation was reached.  Gas exchange parameters revealed no abnormalities. Baseline spirometry  revealed mild-to-moderate obstruction and a possible component of  restriction (formal lung volume testing was not performed). Postexercise  spirometry revealed no exercise-induced bronchospasm.   SUMMARY:  Moderate-to-severe exercise impairment due to a ventilatory  limitation on the basis of obstructive lung disease with possible  restriction. Also noted was decreased blood pressure during exercise which  is markedly abnormal, and could indicate ischemic heart disease. However, it  is noted that she is on 4 antihypertensive medications (a beta blocker,  calcium blocker, ARB, diuretic). Also noted was a blunted heart rate  response slope which suggests  excessive beta blockade.     Suggest maximize bronchodilator therapy, consider ischemia evaluation if  not already done, consider decreasing antihypertensive therapy, specifically  beta blocker therapy. After these interventions are made, retesting could be  performed to quantify improvement. Otherwise she could be followed  symptomatically.           ______________________________  Oley Balm Sung Amabile, M.D. [name]     DBS/MEDQ  D:  10/28/2005  T:  10/29/2005  Job:  [id number]   cc:   Danice Goltz, M.D. [facility]  [address] [place], [place] [address]

## 2011-01-13 NOTE — Discharge Summary (Signed)
[name], [name]                        ACCOUNT NO.:  [id number]   MEDICAL RECORD NO.:  [id number]                   PATIENT TYPE:  INP   LOCATION:  0445                                 FACILITY:  [facility]   PHYSICIAN:  Huel Cote, M.D.              DATE OF BIRTH:  12-11-1940   DATE OF ADMISSION:  06/29/2003  DATE OF DISCHARGE:  07/01/2003                                 DISCHARGE SUMMARY   DISCHARGE DIAGNOSES:  1. Pelvic prolapse.  2. Stress urinary incontinence.  3. Right adnexal mass.  4. Status post laparoscopic removal of right hydrosalpinx and left salpingo-     oophorectomy.  5. Status post anterior/posterior repair with sacrospinous ligament     suspension.  6. Status post SPARC sling placement by Loraine Leriche C. Vernie Ammons, M.D.   DISCHARGE MEDICATIONS:  1. Percocet one to two tablets p.o. q.4h. p.r.n.  2. Motrin 600 mg p.o. q.4h. as needed.  3. She is also resuming her preoperative medicines as stated:  Norvasc,     Maxzide, Toprol, and her insulin.   FOLLOWUP:  The patient is to follow up with Chales Salmon. Abigail Miyamoto, M.D. office  in the next week regarding her blood sugar control and in our office in two  weeks for an incision check and as directed by Loraine Leriche C. Vernie Ammons, M.D.   HOSPITAL COURSE:  The patient was a 70 year old G2, P2.  Was admitted for  surgery for pelvic prolapse including an anterior/posterior repair and  sacrospinous ligament suspension.  She also at the same time had SPARC sling  placed for stress urinary incontinence and a laparoscopic resection of an  adnexal mass which was relieved to be a right hydrosalpinx.  At the same  time she had a left salpingo-oophorectomy.  Though the tube and ovary  appeared normal, she wished these removed for decrease of any future ovarian  cancer risk.  She underwent surgery and did well and was admitted for  postoperative care.  Postoperatively she remained afebrile with stable vital  signs.  Her postoperative hemoglobin  was stable at 10.5.  Her postoperative  CBGs were checked and remained below 200 with a Glucommander.  She was  converted back to her regimen of 75/25 insulin on the p.m. of postoperative  day #1 and in the a.m. of postoperative day #2 and her blood sugars remained  107-181 after discontinuation of her Glucommander.  She did have an episode  of nausea and emesis on the evening of postoperative day #1.  On  postoperative day #2 she has tolerated a regular breakfast and was going to  remain until after lunch to assure no further nausea and vomiting.  It is  unclear if this is related to Percocet possibly, therefore she is going to  stick with Motrin for pain control unless absolutely necessary to take the  Percocet.  She had her Foley catheter removed on postoperative day #2 and  was voiding without difficulty and had no other significant complaints.   CONDITION ON DISCHARGE:  Improved.  Therefore, patient was felt stable for  discharge home and was discharged for follow-up as previously stated.                                               Huel Cote, M.D.    KR/MEDQ  D:  07/01/2003  T:  07/01/2003  Job:  [id number]

## 2011-01-13 NOTE — Op Note (Signed)
[name], [name]                        ACCOUNT NO.:  [id number]   MEDICAL RECORD NO.:  [id number]                   PATIENT TYPE:  INP   LOCATION:  0010                                 FACILITY:  [facility]   PHYSICIAN:  Mark C. Vernie Ammons, M.D.               DATE OF BIRTH:  [birth year]   DATE OF PROCEDURE:  05/29/2003  DATE OF DISCHARGE:                                 OPERATIVE REPORT   PREOPERATIVE DIAGNOSIS:  Stress urinary incontinence.   POSTOPERATIVE DIAGNOSIS:  Stress urinary incontinence.   PROCEDURE:  1. Transobturator suburethral sling placement.  2. Cystoscopy.   SURGEON:  Mark C. Vernie Ammons, M.D.   ASSISTANT:  Susanne Borders, MD   ANESTHESIA:  General endotracheal anesthesia.   ESTIMATED BLOOD LOSS:  Minimal.   DRAINS:  A 16 French Foley catheter to straight drain.   DISPOSITION:  To Dr. Senaida Ores to complete the rest of her case.   INDICATIONS FOR PROCEDURE:  [name] is a 70 year old female with a  history of  anterior repair for cystocele. She has had recurrence of  her  cystocele as well as recent  diagnosis of a rectocele. She also had  significant stress urinary incontinence with daily leakage when she coughs,  laughs and sneezes. She is set to undergo anterior and posterior repair as  well as sacrospinous ligament suspension and laparoscopic resection of  adnexal mass by Dr. Senaida Ores today. She will also have a transobturator  suburethral sling placed by Dr. Vernie Ammons. She agreed to the sling placement  after understanding the  risks, benefits and alternatives.   DESCRIPTION OF PROCEDURE:  The patient was found in the operating room,  having undergone her anterior repair by Dr. Senaida Ores. Her vaginal mucosa  had already been incised in the midline  and dissected laterally.   The vaginal mucosal plane was difficult to establish secondary to extensive  scarring in the area of her previous surgery. We developed the plane between  the posterior  urethra  and the vaginal mucosa more laterally using the  Metzenbaum scissors. The dissection was done such that 2 of the surgeon's  fingers could be placed inside the urethra.   Two small stab incisions were made 5 cm lateral  to the clitoris  bilaterally. The transobturator trocar was placed through the stab incision  and carefully guided through the vaginal mucosa bilaterally. On the right  side the trocar was passed through the vaginal mucosa and had to be taken  out and replaced on this side. Prior to passage of the trocar the patient's  bladder was drained completely.   The ends of the mesh sling were attached to the trocar on both sides and  pulled throughout the stab incision such that the sling was aligned in the  midline  suburethrally. A right-angle was placed beneath the sling to  prevent excessive tension. A cystoscopy was then performed with a 70 degree  lens which demonstrated no mucosal trauma to the bladder indicative of  trauma from the trocar.   The sling material was then cut off at the skin  level  and Dermabond was  applied to this area. Reinspection of the sling revealed that it was flat  and aligned in the midline  and a right-angle could still be passed beneath  it, indicating adequate tension. A Foley catheter was replaced in the  bladder.   The case was turned back over to Dr. Senaida Ores to continue her part in the  case. Please note that Dr. Vernie Ammons was present and participated in all  aspects of the case as he was the primary surgeon.     Susanne Borders, MD                           Veverly Fells. Vernie Ammons, M.D.    DR/MEDQ  D:  06/29/2003  T:  06/29/2003  Job:  [id number]

## 2011-03-18 ENCOUNTER — Observation Stay (HOSPITAL_COMMUNITY)
Admission: EM | Admit: 2011-03-18 | Discharge: 2011-03-18 | Disposition: A | Discharge status: Home / Self Care | Payer: Medicare Other | Attending: Emergency Medicine | Admitting: Emergency Medicine

## 2011-03-18 DIAGNOSIS — Z794 Long term (current) use of insulin: Secondary | ICD-10-CM | POA: Insufficient documentation

## 2011-03-18 DIAGNOSIS — M1990 Unspecified osteoarthritis, unspecified site: Secondary | ICD-10-CM | POA: Insufficient documentation

## 2011-03-18 DIAGNOSIS — I10 Essential (primary) hypertension: Secondary | ICD-10-CM | POA: Insufficient documentation

## 2011-03-18 DIAGNOSIS — E7800 Pure hypercholesterolemia, unspecified: Secondary | ICD-10-CM | POA: Insufficient documentation

## 2011-03-18 DIAGNOSIS — R112 Nausea with vomiting, unspecified: Principal | ICD-10-CM | POA: Insufficient documentation

## 2011-03-18 DIAGNOSIS — G589 Mononeuropathy, unspecified: Secondary | ICD-10-CM | POA: Insufficient documentation

## 2011-03-18 DIAGNOSIS — R197 Diarrhea, unspecified: Secondary | ICD-10-CM | POA: Insufficient documentation

## 2011-03-18 DIAGNOSIS — Z96659 Presence of unspecified artificial knee joint: Secondary | ICD-10-CM | POA: Insufficient documentation

## 2011-03-18 DIAGNOSIS — R509 Fever, unspecified: Secondary | ICD-10-CM | POA: Insufficient documentation

## 2011-03-18 DIAGNOSIS — Z79899 Other long term (current) drug therapy: Secondary | ICD-10-CM | POA: Insufficient documentation

## 2011-03-18 DIAGNOSIS — J45909 Unspecified asthma, uncomplicated: Secondary | ICD-10-CM | POA: Insufficient documentation

## 2011-03-18 LAB — COMPREHENSIVE METABOLIC PANEL
Alkaline Phosphatase: 80 U/L (ref 39–117)
BUN: 19 mg/dL (ref 6–23)
Chloride: 100 mEq/L (ref 96–112)
GFR calc Af Amer: 51 mL/min — ABNORMAL LOW (ref >60)
Glucose, Bld: 175 mg/dL — ABNORMAL HIGH (ref 70–99)
Potassium: 3.5 mEq/L (ref 3.5–5.1)
Total Bilirubin: 0.3 mg/dL (ref 0.3–1.2)

## 2011-03-18 LAB — LIPASE, BLOOD: Lipase: 13 U/L (ref 11–59)

## 2011-03-18 LAB — URINALYSIS, ROUTINE W REFLEX MICROSCOPIC
Bilirubin Urine: NEGATIVE
Glucose, UA: NEGATIVE mg/dL
Ketones, ur: 15 mg/dL — AB
Nitrite: NEGATIVE
Protein, ur: NEGATIVE mg/dL
Specific Gravity, Urine: 1.023 (ref 1.005–1.030)
Urobilinogen, UA: 0.2 mg/dL (ref 0.0–1.0)
pH: 5 (ref 5.0–8.0)

## 2011-03-18 LAB — COMPREHENSIVE METABOLIC PANEL WITH GFR
ALT: 15 U/L (ref 0–35)
AST: 13 U/L (ref 0–37)
Albumin: 3.4 g/dL — ABNORMAL LOW (ref 3.5–5.2)
CO2: 24 meq/L (ref 19–32)
Calcium: 8.9 mg/dL (ref 8.4–10.5)
Creatinine, Ser: 1.26 mg/dL — ABNORMAL HIGH (ref 0.50–1.10)
GFR calc non Af Amer: 42 mL/min — ABNORMAL LOW (ref >60)
Sodium: 136 meq/L (ref 135–145)
Total Protein: 6.4 g/dL (ref 6.0–8.3)

## 2011-03-18 LAB — CBC
HCT: 33.6 % — ABNORMAL LOW (ref 36.0–46.0)
Hemoglobin: 11.2 g/dL — ABNORMAL LOW (ref 12.0–15.0)
MCH: 28.2 pg (ref 26.0–34.0)
MCHC: 33.3 g/dL (ref 30.0–36.0)
MCV: 84.6 fL (ref 78.0–100.0)
Platelets: 181 K/uL (ref 150–400)
RBC: 3.97 MIL/uL (ref 3.87–5.11)
RDW: 13.6 % (ref 11.5–15.5)
WBC: 11.3 10*3/uL — ABNORMAL HIGH (ref 4.0–10.5)

## 2011-03-18 LAB — DIFFERENTIAL
Basophils Absolute: 0 K/uL (ref 0.0–0.1)
Basophils Relative: 0 % (ref 0–1)
Eosinophils Absolute: 0 K/uL (ref 0.0–0.7)
Eosinophils Relative: 0 % (ref 0–5)
Lymphocytes Relative: 9 % — ABNORMAL LOW (ref 12–46)
Lymphs Abs: 1 10*3/uL (ref 0.7–4.0)
Monocytes Absolute: 0.7 K/uL (ref 0.1–1.0)
Monocytes Relative: 6 % (ref 3–12)
Neutro Abs: 9.5 K/uL — ABNORMAL HIGH (ref 1.7–7.7)
Neutrophils Relative %: 85 % — ABNORMAL HIGH (ref 43–77)

## 2011-03-18 LAB — GLUCOSE, CAPILLARY: Glucose-Capillary: 169 mg/dL — ABNORMAL HIGH (ref 70–99)

## 2011-03-18 LAB — URINE MICROSCOPIC-ADD ON

## 2011-03-20 ENCOUNTER — Inpatient Hospital Stay (HOSPITAL_COMMUNITY)
Admission: EM | Admit: 2011-03-20 | Discharge: 2011-03-23 | DRG: 373 | Disposition: A | Discharge status: Home Health | Payer: Medicare Other | Attending: Internal Medicine | Admitting: Internal Medicine

## 2011-03-20 ENCOUNTER — Emergency Department (HOSPITAL_COMMUNITY): Payer: Medicare Other

## 2011-03-20 DIAGNOSIS — Z794 Long term (current) use of insulin: Secondary | ICD-10-CM

## 2011-03-20 DIAGNOSIS — I10 Essential (primary) hypertension: Secondary | ICD-10-CM | POA: Diagnosis present

## 2011-03-20 DIAGNOSIS — Z79899 Other long term (current) drug therapy: Secondary | ICD-10-CM

## 2011-03-20 DIAGNOSIS — Z96659 Presence of unspecified artificial knee joint: Secondary | ICD-10-CM

## 2011-03-20 DIAGNOSIS — IMO0001 Reserved for inherently not codable concepts without codable children: Secondary | ICD-10-CM | POA: Diagnosis present

## 2011-03-20 DIAGNOSIS — E785 Hyperlipidemia, unspecified: Secondary | ICD-10-CM | POA: Diagnosis present

## 2011-03-20 DIAGNOSIS — E669 Obesity, unspecified: Secondary | ICD-10-CM | POA: Diagnosis present

## 2011-03-20 DIAGNOSIS — G609 Hereditary and idiopathic neuropathy, unspecified: Secondary | ICD-10-CM | POA: Diagnosis present

## 2011-03-20 DIAGNOSIS — A020 Salmonella enteritis: Principal | ICD-10-CM | POA: Diagnosis present

## 2011-03-20 DIAGNOSIS — J4489 Other specified chronic obstructive pulmonary disease: Secondary | ICD-10-CM | POA: Diagnosis present

## 2011-03-20 DIAGNOSIS — J449 Chronic obstructive pulmonary disease, unspecified: Secondary | ICD-10-CM | POA: Diagnosis present

## 2011-03-20 DIAGNOSIS — K219 Gastro-esophageal reflux disease without esophagitis: Secondary | ICD-10-CM | POA: Diagnosis present

## 2011-03-20 DIAGNOSIS — E7800 Pure hypercholesterolemia, unspecified: Secondary | ICD-10-CM | POA: Diagnosis present

## 2011-03-20 DIAGNOSIS — M1990 Unspecified osteoarthritis, unspecified site: Secondary | ICD-10-CM | POA: Diagnosis present

## 2011-03-20 DIAGNOSIS — E860 Dehydration: Secondary | ICD-10-CM | POA: Diagnosis present

## 2011-03-20 HISTORY — DX: Essential (primary) hypertension: I10

## 2011-03-20 LAB — COMPREHENSIVE METABOLIC PANEL
ALT: 17 U/L (ref 0–35)
AST: 17 U/L (ref 0–37)
Albumin: 3.1 g/dL — ABNORMAL LOW (ref 3.5–5.2)
CO2: 25 mEq/L (ref 19–32)
Calcium: 9.1 mg/dL (ref 8.4–10.5)
Creatinine, Ser: 0.73 mg/dL (ref 0.50–1.10)
GFR calc non Af Amer: >60 mL/min (ref >60)
Sodium: 133 mEq/L — ABNORMAL LOW (ref 135–145)
Total Protein: 6.4 g/dL (ref 6.0–8.3)

## 2011-03-20 LAB — CBC
MCH: 28.1 pg (ref 26.0–34.0)
MCHC: 34.1 g/dL (ref 30.0–36.0)
MCV: 82.2 fL (ref 78.0–100.0)
Platelets: 186 10*3/uL (ref 150–400)
RBC: 3.99 MIL/uL (ref 3.87–5.11)
RDW: 13.1 % (ref 11.5–15.5)

## 2011-03-20 LAB — URINALYSIS, ROUTINE W REFLEX MICROSCOPIC
Glucose, UA: NEGATIVE mg/dL
Hgb urine dipstick: NEGATIVE
Leukocytes, UA: NEGATIVE
Protein, ur: 30 mg/dL — AB
Specific Gravity, Urine: 1.021 (ref 1.005–1.030)
pH: 6 (ref 5.0–8.0)

## 2011-03-20 LAB — URINE MICROSCOPIC-ADD ON

## 2011-03-20 LAB — DIFFERENTIAL
Basophils Relative: 0 % (ref 0–1)
Eosinophils Absolute: 0 10*3/uL (ref 0.0–0.7)
Eosinophils Relative: 0 % (ref 0–5)
Lymphs Abs: 1.3 10*3/uL (ref 0.7–4.0)
Monocytes Relative: 16 % — ABNORMAL HIGH (ref 3–12)
Neutrophils Relative %: 66 % (ref 43–77)

## 2011-03-21 ENCOUNTER — Inpatient Hospital Stay (HOSPITAL_COMMUNITY): Payer: Medicare Other

## 2011-03-21 ENCOUNTER — Encounter (HOSPITAL_COMMUNITY): Payer: Self-pay | Admitting: Radiology

## 2011-03-21 LAB — GLUCOSE, CAPILLARY: Glucose-Capillary: 257 mg/dL — ABNORMAL HIGH (ref 70–99)

## 2011-03-21 LAB — CBC
HCT: 30.1 % — ABNORMAL LOW (ref 36.0–46.0)
MCH: 28.2 pg (ref 26.0–34.0)
MCV: 82.5 fL (ref 78.0–100.0)
RBC: 3.65 MIL/uL — ABNORMAL LOW (ref 3.87–5.11)
WBC: 7.3 10*3/uL (ref 4.0–10.5)

## 2011-03-21 LAB — DIFFERENTIAL
Eosinophils Relative: 0 % (ref 0–5)
Lymphocytes Relative: 17 % (ref 12–46)
Lymphs Abs: 1.2 10*3/uL (ref 0.7–4.0)
Monocytes Relative: 13 % — ABNORMAL HIGH (ref 3–12)

## 2011-03-21 LAB — HEMOGLOBIN A1C
Hgb A1c MFr Bld: 6.9 % — ABNORMAL HIGH (ref <5.7)
Mean Plasma Glucose: 151 mg/dL — ABNORMAL HIGH (ref <117)

## 2011-03-21 MED ORDER — IOHEXOL 300 MG/ML  SOLN
100.0000 mL | Freq: Once | INTRAMUSCULAR | Status: AC | PRN
  Administered 2011-03-21: 100 mL via INTRAVENOUS

## 2011-03-22 LAB — GLUCOSE, CAPILLARY
Glucose-Capillary: 202 mg/dL — ABNORMAL HIGH (ref 70–99)
Glucose-Capillary: 174 mg/dL — ABNORMAL HIGH (ref 70–99)
Glucose-Capillary: 260 mg/dL — ABNORMAL HIGH (ref 70–99)

## 2011-03-22 LAB — CBC
Platelets: 216 10*3/uL (ref 150–400)
RDW: 13.2 % (ref 11.5–15.5)
WBC: 8 10*3/uL (ref 4.0–10.5)

## 2011-03-22 LAB — COMPREHENSIVE METABOLIC PANEL
ALT: 19 U/L (ref 0–35)
AST: 16 U/L (ref 0–37)
Albumin: 2.7 g/dL — ABNORMAL LOW (ref 3.5–5.2)
Alkaline Phosphatase: 75 U/L (ref 39–117)
Chloride: 99 mEq/L (ref 96–112)
Creatinine, Ser: 0.71 mg/dL (ref 0.50–1.10)
Potassium: 3.9 mEq/L (ref 3.5–5.1)
Sodium: 134 mEq/L — ABNORMAL LOW (ref 135–145)
Total Bilirubin: 0.1 mg/dL — ABNORMAL LOW (ref 0.3–1.2)

## 2011-03-23 LAB — GLUCOSE, CAPILLARY: Glucose-Capillary: 233 mg/dL — ABNORMAL HIGH (ref 70–99)

## 2011-03-27 NOTE — H&P (Signed)
[name], [name] NO.:  [id number]  MEDICAL RECORD NO.:  [id number]  LOCATION:  [facility]                         FACILITY:  [facility]  PHYSICIAN:  Houston Siren, MD           DATE OF BIRTH:  February 22, 1941  DATE OF ADMISSION:  03/20/2011 DATE OF DISCHARGE:                             HISTORY & PHYSICAL   PRIMARY CARE PHYSICIAN:  Dr. Primitivo Gauze.  ADVANCE DIRECTIVE:  Full code.  REASON FOR ADMISSION:  Diarrhea, nausea, and vomiting.  HISTORY OF PRESENT ILLNESS:  This is a 70 year old female with history of obesity, adult-onset diabetes, hypertension, hypercholesterolemia, GERD, asthma, osteoarthritis, neurogenic claudication, emphysema, status post hysterectomy, and appendectomy, presents to the emergency room several times already this week complaining of watery diarrhea.  She also has nausea, vomiting, and abdominal cramps but no constant abdominal pain.  She has not been able to take much liquids.  She has no fever, chills, black or bloody stool.  She denies any distant travel or any ill contact, and she was not given any antibiotic recently.  She has no known history of inflammatory bowel disease nor did she have any family history of inflammatory bowel disease.  She had a colonoscopy in the distant past and reportedly had a polyp removed.  Because of her persistent nausea and vomiting, hospitalist was asked to admit the patient for symptomatic treatment and to rehydrate her.  PAST MEDICAL HISTORY: 1. Hypertension. 2. Type 2 diabetes,. 3. GERD. 4. Hyperlipidemia. 5. Asthma. 6. Osteoarthritis. 7. Neuropathy. 8. Neurogenic claudication. 9. Osteoarthritis. 10.History of obstructive lung disease with possible restriction. 11.Pelvic prolapse. 12.Urinary incontinence. 13.History of right adnexal mass, status post [facility] sling placement     October 2004. 14.Status post appendectomy 1952. 15.Status post hysterectomy 1977. 16.Fracture status post permanent  implantation of spinal cord     stimulator battery secondary to chronic back and bilateral leg pain     in 2009.  MEDICATIONS: 1. Lantus 66 units. 2. Lopressor 75 mg b.i.d. 3. Amlodipine. 4. Losartan. 5. Doxazosin 4 mg at bedtime. 6. Simvastatin 40 mg per day. 7. Amitriptyline 100 mg at bedtime. 8. Gabapentin 300 mg t.i.d. 9. Symbicort inhaler.  SOCIAL HISTORY:  She is married.  She is a retired Runner, broadcasting/film/video.  Denies alcohol, tobacco, or drug use.  She lives with her spouse.  FAMILY HISTORY:  Noncontributory.  Specifically, no inflammatory bowel disease.  PHYSICAL EXAMINATION:  GENERAL:  She is in no apparent distress.  She is alert and oriented and conversing.  She appears fatigued. VITAL SIGNS:  Blood pressure 168/60, pulse of 110, respiratory rate of 20, temperature 99.5. HEENT:  Sclerae are nonicteric.  Throat clear. NECK:  Supple.  No lymphadenopathy. SKIN:  Good skin turgor. CARDIAC:  S1, S2 regular.  I did not hear any murmur, rub, or gallop. LUNGS:  Clear.  No wheezes, rales, or any evidence of consolidation. ABDOMEN:  Slightly obese, nondistended, slightly tender throughout.  No right upper quadrant pain.  No ascites or any mass palpable.  No rebound.  Bowel sounds present. EXTREMITIES:  No edema, status post right knee surgery.  Calf without any tenderness.  Good distal pulses bilaterally. NEUROLOGIC/PSYCHIATRIC:  Unremarkable as  well.  OBJECTIVE FINDINGS:  Abdominal series show nonobstructive bowel pattern and no free air.  Possible fold thickening suggesting colitis.  Stable postop changes.  Urinalysis is negative.  Lipase 23, potassium 3.3. Serum sodium 133, creatinine 0.73.  Liver function tests are all normal white count 7700, hemoglobin of 11.2, platelet count 186,000.  IMPRESSION:  This is a 70 year old female with several emergency room visits for watery diarrhea, nausea, vomiting and abdominal cramps. Differentials includes viral versus infectious  gastroenteritis.  I do not think that this is ischemic bowel nor inflammatory bowel disease.  She has not been on any antibiotics, but we will check for C. diff PCR.  We will admit her, start her on clear liquid, and advance as tolerated.  We will give an antiemetic and analgesics along with getting the rest of the stool studies.  Because she is n.p.o., we will give dextrose with decrease in Lantus from 66 units to 30 units.  We will discontinue her diuretics. She will be given intravenous fluid, and as soon as her medications are reconciled, we will continue them as well.  She is stable, will be admitted to the general medical floor under [facility]. She is a full code.  We will be sure to replace her potassium.      Houston Siren, MD     PL/MEDQ  D:  03/21/2011  T:  03/21/2011  Job:  [id number]  Electronically Signed by Houston Siren  on 03/27/2011 03:41:19 AM

## 2011-03-27 NOTE — Discharge Summary (Signed)
  NAMEANAEL, ROSCH NO.:  [id number]  MEDICAL RECORD NO.:  [id number]  LOCATION:  5008                         FACILITY:  [facility]  PHYSICIAN:  Lonia Blood, M.D.       DATE OF BIRTH:  Jan 17, 1941  DATE OF ADMISSION:  03/20/2011 DATE OF DISCHARGE:  03/23/2011                              DISCHARGE SUMMARY   PRIMARY CARE PHYSICIAN:  Dr. Juluis Rainier.  DISCHARGE DIAGNOSES: 1. Salmonella colitis. 2. Dehydration. 3. Hypertension. 4. Diabetes mellitus type 2. 5. Gastroesophageal reflux disease. 6. Hyperlipidemia. 7. Asthma. 8. Osteoarthritis. 9. Peripheral neuropathy. 10.Status post implantation of a spinal core stimulator for chronic     back pain and bilateral leg pain by Dr. Shon Baton in 2009. 11.Status post splenectomy 12.Status post hysterectomy.  DISCHARGE MEDICATIONS: 1. Aleve 2 tablets daily as needed for pain. 2. Amlodipine 10 mg daily. 3. Ciprofloxacin 500 mg twice a day. 4. Gabapentin 300 mg 3 times a day. 5. Lispro insulin 10-22 units 3 times a day with meals. 6. Lantus 66 units at bedtime. 7. Losartan 100 mg daily. 8. Metoprolol 75 mg twice a day. 9. Omeprazole 20 mg twice a day. 10.Albuterol 2 puffs every 4 hours as needed for breathing. 11.Simvastatin 40 mg daily. 12.Symbicort 160/4.5 two puffs inhale twice a day as needed.  CONDITION ON DISCHARGE:  Ms. Villalona was discharged in good condition. Temperature 98.0, heart rate 77, respirations 18, blood pressure 136/55, saturation 98% on room air.  The patient will follow up with her primary care physician Dr. Juluis Rainier.  At the time of discharge, the patient was told to hold the doxazosin and triamterene HCTZ until she recovers from her acute Salmonella illness.  PROCEDURES DURING THIS ADMISSION: 1. The patient underwent CT scan of abdomen and pelvis that showed     mild colitis. 2. Stool cultures showing Salmonella  HISTORY AND PHYSICAL:  Refer to the dictated H and P done by Dr.  Conley Rolls.  HOSPITAL COURSE:  Ms. Cubillos is a 70 year old woman with multiple medical problems was admitted with severe diarrhea.  She was also very tired and dehydrated by the time of admission.  After admission, the patient was on intravenous fluids and she was placed empirically on antibiotics, ciprofloxacin and Flagyl.  The stool cultures grew Salmonella.  Salmonella was sensitive to ciprofloxacin.  Discontinue Flagyl, continue ciprofloxacin alone with the marked improvement in the frequency of the bowel movements of the patient.  She was able to rest better and by today March 23, 2011, she felt ready for discharge with home health physical therapy under the care of her husband.  The patient will complete 10 days of antibiotics and then she will follow up with her primary care physician Dr. Juluis Rainier for resumption of the rest of the antihypertensives.     Lonia Blood, M.D.     SL/MEDQ  D:  03/23/2011  T:  03/24/2011  Job:  [id number]  cc:   Juluis Rainier, M.D. Tonita Cong, M.D.  Electronically Signed by Lonia Blood M.D. on 03/27/2011 05:36:01 PM

## 2011-03-31 LAB — STOOL CULTURE

## 2011-05-30 LAB — BASIC METABOLIC PANEL
BUN: 26 — ABNORMAL HIGH
CO2: 27
Chloride: 103
Creatinine, Ser: 1.37 — ABNORMAL HIGH
Potassium: 4.7

## 2011-05-30 LAB — GLUCOSE, CAPILLARY
Glucose-Capillary: 139 — ABNORMAL HIGH
Glucose-Capillary: 258 — ABNORMAL HIGH

## 2011-05-30 LAB — CBC
HCT: 35.9 — ABNORMAL LOW
MCHC: 33.2
MCV: 86.5
Platelets: 282
WBC: 9

## 2011-06-02 LAB — COMPREHENSIVE METABOLIC PANEL
ALT: 17 U/L (ref 0–35)
AST: 19 U/L (ref 0–37)
Albumin: 3.5 g/dL (ref 3.5–5.2)
Alkaline Phosphatase: 61 U/L (ref 39–117)
BUN: 21 mg/dL (ref 6–23)
CO2: 25 mEq/L (ref 19–32)
Calcium: 8.9 mg/dL (ref 8.4–10.5)
Chloride: 110 mEq/L (ref 96–112)
Creatinine, Ser: 1.18 mg/dL (ref 0.4–1.2)
Glucose, Bld: 68 mg/dL — ABNORMAL LOW (ref 70–99)
Total Protein: 5.9 g/dL — ABNORMAL LOW (ref 6.0–8.3)

## 2011-06-02 LAB — GLUCOSE, CAPILLARY
Glucose-Capillary: 109 mg/dL — ABNORMAL HIGH (ref 70–99)
Glucose-Capillary: 120 mg/dL — ABNORMAL HIGH (ref 70–99)
Glucose-Capillary: 145 mg/dL — ABNORMAL HIGH (ref 70–99)
Glucose-Capillary: 147 mg/dL — ABNORMAL HIGH (ref 70–99)
Glucose-Capillary: 152 mg/dL — ABNORMAL HIGH (ref 70–99)
Glucose-Capillary: 157 mg/dL — ABNORMAL HIGH (ref 70–99)
Glucose-Capillary: 160 mg/dL — ABNORMAL HIGH (ref 70–99)
Glucose-Capillary: 176 mg/dL — ABNORMAL HIGH (ref 70–99)
Glucose-Capillary: 221 mg/dL — ABNORMAL HIGH (ref 70–99)
Glucose-Capillary: 96 mg/dL (ref 70–99)

## 2011-06-02 LAB — CARDIAC PANEL(CRET KIN+CKTOT+MB+TROPI)
CK, MB: 5.9 ng/mL — ABNORMAL HIGH (ref 0.3–4.0)
Relative Index: 3.9 — ABNORMAL HIGH (ref 0.0–2.5)
Total CK: 153 U/L (ref 7–177)
Troponin I: <0.01 ng/mL (ref 0.00–0.06)

## 2011-06-02 LAB — DIFFERENTIAL
Basophils Absolute: 0 10*3/uL (ref 0.0–0.1)
Basophils Relative: 1 % (ref 0–1)
Basophils Relative: 1 % (ref 0–1)
Eosinophils Absolute: 0.2 10*3/uL (ref 0.0–0.7)
Eosinophils Absolute: 0.2 10*3/uL (ref 0.0–0.7)
Eosinophils Relative: 3 % (ref 0–5)
Lymphocytes Relative: 36 % (ref 12–46)
Lymphs Abs: 2.4 10*3/uL (ref 0.7–4.0)
Lymphs Abs: 2.6 10*3/uL (ref 0.7–4.0)
Monocytes Absolute: 0.6 10*3/uL (ref 0.1–1.0)
Monocytes Absolute: 0.7 10*3/uL (ref 0.1–1.0)
Monocytes Relative: 8 % (ref 3–12)
Monocytes Relative: 8 % (ref 3–12)
Neutro Abs: 3.7 10*3/uL (ref 1.7–7.7)
Neutrophils Relative %: 52 % (ref 43–77)

## 2011-06-02 LAB — CBC
HCT: 29.8 % — ABNORMAL LOW (ref 36.0–46.0)
MCHC: 32.9 g/dL (ref 30.0–36.0)
MCHC: 33.2 g/dL (ref 30.0–36.0)
MCV: 85.3 fL (ref 78.0–100.0)
RBC: 3.28 MIL/uL — ABNORMAL LOW (ref 3.87–5.11)
RBC: 3.5 MIL/uL — ABNORMAL LOW (ref 3.87–5.11)
WBC: 7.2 10*3/uL (ref 4.0–10.5)
WBC: 8.7 10*3/uL (ref 4.0–10.5)

## 2011-06-02 LAB — URINALYSIS, ROUTINE W REFLEX MICROSCOPIC
Glucose, UA: NEGATIVE mg/dL
Ketones, ur: NEGATIVE mg/dL
Nitrite: NEGATIVE
Protein, ur: NEGATIVE mg/dL
pH: 5 (ref 5.0–8.0)

## 2011-06-02 LAB — VITAMIN B12: Vitamin B-12: 379 pg/mL (ref 211–911)

## 2011-06-02 LAB — BASIC METABOLIC PANEL
CO2: 26 mEq/L (ref 19–32)
Calcium: 8.9 mg/dL (ref 8.4–10.5)
Creatinine, Ser: 0.93 mg/dL (ref 0.4–1.2)
GFR calc Af Amer: >60 mL/min (ref >60)

## 2011-06-02 LAB — OCCULT BLOOD X 1 CARD TO LAB, STOOL: Fecal Occult Bld: POSITIVE

## 2011-06-02 LAB — IRON AND TIBC
Iron: 32 ug/dL — ABNORMAL LOW (ref 42–135)
TIBC: 367 ug/dL (ref 250–470)
UIBC: 335 ug/dL

## 2011-06-02 LAB — POCT I-STAT 3, ART BLOOD GAS (G3+)
O2 Saturation: 97 %
TCO2: 25 mmol/L (ref 0–100)
pCO2 arterial: 36.6 mmHg (ref 35.0–45.0)
pO2, Arterial: 92 mmHg (ref 80.0–100.0)

## 2011-06-02 LAB — CK TOTAL AND CKMB (NOT AT ARMC): Relative Index: 4.2 — ABNORMAL HIGH (ref 0.0–2.5)

## 2011-06-02 LAB — PROTIME-INR: INR: 1.1 (ref 0.00–1.49)

## 2011-06-02 LAB — RETICULOCYTES
RBC.: 3.7 MIL/uL — ABNORMAL LOW (ref 3.87–5.11)
Retic Ct Pct: 1.9 % (ref 0.4–3.1)

## 2011-06-02 LAB — TROPONIN I: Troponin I: <0.01 ng/mL (ref 0.00–0.06)

## 2011-06-02 LAB — FERRITIN: Ferritin: 33 ng/mL (ref 10–291)

## 2011-06-02 LAB — FOLATE RBC: RBC Folate: 877 ng/mL — ABNORMAL HIGH (ref 180–600)

## 2011-06-02 LAB — RAPID URINE DRUG SCREEN, HOSP PERFORMED: Barbiturates: NOT DETECTED

## 2011-06-14 LAB — CBC
Platelets: 263
RBC: 4.09
WBC: 8.5

## 2011-06-14 LAB — BASIC METABOLIC PANEL
BUN: 17
Calcium: 9.8
Creatinine, Ser: 1.1
GFR calc Af Amer: >60

## 2011-06-14 LAB — PROTIME-INR
INR: 1.1
Prothrombin Time: 14.4

## 2011-06-14 LAB — URINALYSIS, ROUTINE W REFLEX MICROSCOPIC
Nitrite: NEGATIVE
Protein, ur: NEGATIVE
Urobilinogen, UA: 0.2

## 2011-09-14 ENCOUNTER — Other Ambulatory Visit: Payer: Self-pay | Admitting: Neurological Surgery

## 2011-09-14 DIAGNOSIS — M542 Cervicalgia: Secondary | ICD-10-CM

## 2011-09-14 DIAGNOSIS — M549 Dorsalgia, unspecified: Secondary | ICD-10-CM

## 2011-09-19 ENCOUNTER — Ambulatory Visit
Admission: RE | Admit: 2011-09-19 | Discharge: 2011-09-19 | Disposition: A | Discharge status: Home / Self Care | Payer: Medicare Other | Source: Ambulatory Visit | Attending: Neurological Surgery | Admitting: Neurological Surgery

## 2011-09-19 DIAGNOSIS — M542 Cervicalgia: Secondary | ICD-10-CM

## 2011-09-19 DIAGNOSIS — M549 Dorsalgia, unspecified: Secondary | ICD-10-CM

## 2011-09-19 MED ORDER — HYDROCODONE-ACETAMINOPHEN 5-325 MG PO TABS
1.0000 | ORAL_TABLET | Freq: Once | ORAL | Status: AC
  Administered 2011-09-19: 1 via ORAL

## 2011-09-19 MED ORDER — DIAZEPAM 5 MG PO TABS
5.0000 mg | ORAL_TABLET | Freq: Once | ORAL | Status: AC
  Administered 2011-09-19: 5 mg via ORAL

## 2011-09-19 MED ORDER — IOHEXOL 300 MG/ML  SOLN
9.0000 mL | Freq: Once | INTRAMUSCULAR | Status: AC | PRN
  Administered 2011-09-19: 9 mL via INTRATHECAL

## 2011-09-19 NOTE — Progress Notes (Signed)
Explained discharge instruction, consent signed and valium given.

## 2012-02-21 ENCOUNTER — Other Ambulatory Visit: Payer: Self-pay | Admitting: Internal Medicine

## 2012-02-21 DIAGNOSIS — Z1231 Encounter for screening mammogram for malignant neoplasm of breast: Secondary | ICD-10-CM

## 2012-03-06 ENCOUNTER — Ambulatory Visit
Admission: RE | Admit: 2012-03-06 | Discharge: 2012-03-06 | Disposition: A | Discharge status: Home / Self Care | Payer: Medicare Other | Source: Ambulatory Visit | Attending: Internal Medicine | Admitting: Internal Medicine

## 2012-03-06 DIAGNOSIS — Z1231 Encounter for screening mammogram for malignant neoplasm of breast: Secondary | ICD-10-CM

## 2012-06-04 ENCOUNTER — Ambulatory Visit: Payer: Medicare Other | Attending: Internal Medicine | Admitting: Physical Therapy

## 2012-06-04 DIAGNOSIS — IMO0001 Reserved for inherently not codable concepts without codable children: Secondary | ICD-10-CM | POA: Insufficient documentation

## 2012-06-04 DIAGNOSIS — R5381 Other malaise: Secondary | ICD-10-CM | POA: Insufficient documentation

## 2012-06-04 DIAGNOSIS — Z9181 History of falling: Secondary | ICD-10-CM | POA: Insufficient documentation

## 2012-06-14 ENCOUNTER — Ambulatory Visit: Payer: Medicare Other | Admitting: Physical Therapy

## 2012-06-18 ENCOUNTER — Ambulatory Visit: Payer: Medicare Other | Admitting: Physical Therapy

## 2012-06-21 ENCOUNTER — Encounter: Payer: Medicare Other | Admitting: Physical Therapy

## 2012-06-24 ENCOUNTER — Ambulatory Visit: Payer: Medicare Other | Admitting: Physical Therapy

## 2012-06-26 ENCOUNTER — Ambulatory Visit: Payer: Medicare Other | Admitting: Physical Therapy

## 2012-07-02 ENCOUNTER — Ambulatory Visit: Payer: Medicare Other | Attending: Internal Medicine | Admitting: Physical Therapy

## 2012-07-02 DIAGNOSIS — Z9181 History of falling: Secondary | ICD-10-CM | POA: Insufficient documentation

## 2012-07-02 DIAGNOSIS — R5381 Other malaise: Secondary | ICD-10-CM | POA: Insufficient documentation

## 2012-07-02 DIAGNOSIS — IMO0001 Reserved for inherently not codable concepts without codable children: Secondary | ICD-10-CM | POA: Insufficient documentation

## 2012-07-04 ENCOUNTER — Ambulatory Visit: Payer: Medicare Other | Admitting: Physical Therapy

## 2012-07-08 ENCOUNTER — Ambulatory Visit: Payer: Medicare Other | Admitting: Physical Therapy

## 2012-07-10 ENCOUNTER — Encounter: Payer: Medicare Other | Admitting: Physical Therapy

## 2012-07-23 ENCOUNTER — Ambulatory Visit: Payer: Medicare Other | Admitting: Physical Therapy

## 2012-07-31 ENCOUNTER — Encounter: Payer: Medicare Other | Admitting: Physical Therapy

## 2012-08-02 ENCOUNTER — Encounter: Payer: Medicare Other | Admitting: Physical Therapy

## 2012-08-05 ENCOUNTER — Encounter: Payer: Medicare Other | Admitting: Physical Therapy

## 2012-08-07 ENCOUNTER — Encounter: Payer: Medicare Other | Admitting: Physical Therapy

## 2013-03-05 ENCOUNTER — Encounter: Payer: Self-pay | Admitting: Internal Medicine

## 2013-03-14 ENCOUNTER — Encounter: Payer: Self-pay | Admitting: Internal Medicine

## 2013-05-01 ENCOUNTER — Encounter: Payer: Self-pay | Admitting: Internal Medicine

## 2013-05-01 ENCOUNTER — Ambulatory Visit (AMBULATORY_SURGERY_CENTER): Payer: Self-pay

## 2013-05-01 VITALS — Ht 64.0 in | Wt 164.0 lb

## 2013-05-01 DIAGNOSIS — Z1211 Encounter for screening for malignant neoplasm of colon: Secondary | ICD-10-CM

## 2013-05-01 MED ORDER — MOVIPREP 100 G PO SOLR: 1.0000 | Freq: Once | ORAL | Status: DC

## 2013-05-15 ENCOUNTER — Ambulatory Visit (AMBULATORY_SURGERY_CENTER): Payer: Medicare Other | Admitting: Internal Medicine

## 2013-05-15 ENCOUNTER — Encounter: Payer: Self-pay | Admitting: Internal Medicine

## 2013-05-15 VITALS — BP 139/72 | HR 69 | Temp 97.2°F | Resp 16 | Ht 64.0 in | Wt 164.0 lb

## 2013-05-15 DIAGNOSIS — Z1211 Encounter for screening for malignant neoplasm of colon: Secondary | ICD-10-CM

## 2013-05-15 DIAGNOSIS — D126 Benign neoplasm of colon, unspecified: Secondary | ICD-10-CM

## 2013-05-15 HISTORY — PX: COLONOSCOPY W/ POLYPECTOMY: SHX1380

## 2013-05-15 LAB — GLUCOSE, CAPILLARY
Glucose-Capillary: 170 mg/dL — ABNORMAL HIGH (ref 70–99)
Glucose-Capillary: 150 mg/dL — ABNORMAL HIGH (ref 70–99)

## 2013-05-15 MED ORDER — SODIUM CHLORIDE 0.9 % IV SOLN: 500.0000 mL | INTRAVENOUS | Status: DC

## 2013-05-15 NOTE — Patient Instructions (Addendum)
Discharge instructions given with verbal understanding. Handout on polyps given. Resume previous medications. YOU HAD AN ENDOSCOPIC PROCEDURE TODAY AT THE [facility]: Refer to the procedure report that was given to you for any specific questions about what was found during the examination.  If the procedure report does not answer your questions, please call your gastroenterologist to clarify.  If you requested that your care partner not be given the details of your procedure findings, then the procedure report has been included in a sealed envelope for you to review at your convenience later.  YOU SHOULD EXPECT: Some feelings of bloating in the abdomen. Passage of more gas than usual.  Walking can help get rid of the air that was put into your GI tract during the procedure and reduce the bloating. If you had a lower endoscopy (such as a colonoscopy or flexible sigmoidoscopy) you may notice spotting of blood in your stool or on the toilet paper. If you underwent a bowel prep for your procedure, then you may not have a normal bowel movement for a few days.  DIET: Your first meal following the procedure should be a light meal and then it is ok to progress to your normal diet.  A half-sandwich or bowl of soup is an example of a good first meal.  Heavy or fried foods are harder to digest and may make you feel nauseous or bloated.  Likewise meals heavy in dairy and vegetables can cause extra gas to form and this can also increase the bloating.  Drink plenty of fluids but you should avoid alcoholic beverages for 24 hours.  ACTIVITY: Your care partner should take you home directly after the procedure.  You should plan to take it easy, moving slowly for the rest of the day.  You can resume normal activity the day after the procedure however you should NOT DRIVE or use heavy machinery for 24 hours (because of the sedation medicines used during the test).    SYMPTOMS TO REPORT IMMEDIATELY: A  gastroenterologist can be reached at any hour.  During normal business hours, 8:30 AM to 5:00 PM Monday through Friday, call [phone].  After hours and on weekends, please call the GI answering service at [phone] who will take a message and have the physician on call contact you.   Following lower endoscopy (colonoscopy or flexible sigmoidoscopy):  Excessive amounts of blood in the stool  Significant tenderness or worsening of abdominal pains  Swelling of the abdomen that is new, acute  Fever of 100F or higher  FOLLOW UP: If any biopsies were taken you will be contacted by phone or by letter within the next 1-3 weeks.  Call your gastroenterologist if you have not heard about the biopsies in 3 weeks.  Our staff will call the home number listed on your records the next business day following your procedure to check on you and address any questions or concerns that you may have at that time regarding the information given to you following your procedure. This is a courtesy call and so if there is no answer at the home number and we have not heard from you through the emergency physician on call, we will assume that you have returned to your regular daily activities without incident.  SIGNATURES/CONFIDENTIALITY: You and/or your care partner have signed paperwork which will be entered into your electronic medical record.  These signatures attest to the fact that that the information above on your After Visit Summary has   been reviewed and is understood.  Full responsibility of the confidentiality of this discharge information lies with you and/or your care-partner. 

## 2013-05-15 NOTE — Progress Notes (Signed)
Called to room to assist during endoscopic procedure.  Patient ID and intended procedure confirmed with present staff. Received instructions for my participation in the procedure from the performing physician. ewm 

## 2013-05-15 NOTE — Progress Notes (Signed)
Patient did not experience any of the following events: a burn prior to discharge; a fall within the facility; wrong site/side/patient/procedure/implant event; or a hospital transfer or hospital admission upon discharge from the facility. (G8907) Patient did not have preoperative order for IV antibiotic SSI prophylaxis. (G8918)  

## 2013-05-15 NOTE — Op Note (Signed)
[facility] [address] [place], [address]   COLONOSCOPY PROCEDURE REPORT  PATIENT: Nancy, [name]  MR#: [id number] BIRTHDATE: 1941/03/02 , 72  yrs. old GENDER: Female ENDOSCOPIST: Roxy Cedar, MD REFERRED AO:[id number] Recall, PROCEDURE DATE:  05/15/2013 PROCEDURE:   Colonoscopy with snare polypectomy x 1 First Screening Colonoscopy - Avg.  risk and is 50 yrs.  old or older - No.  Prior Negative Screening - Now for repeat screening. 10 or more years since last screening  History of Adenoma - Now for follow-up colonoscopy & has been > or = to 3 yrs.  N/A  Polyps Removed Today? Yes. ASA CLASS:   Class II INDICATIONS:average risk screening.   Negative index exam 03-2003 MEDICATIONS: MAC sedation, administered by CRNA and propofol (Diprivan) 150mg  IV  DESCRIPTION OF PROCEDURE:   After the risks benefits and alternatives of the procedure were thoroughly explained, informed consent was obtained.  A digital rectal exam revealed no abnormalities of the rectum.   The [id number]  endoscope was introduced through the anus and advanced to the cecum, which was identified by both the appendix and ileocecal valve. No adverse events experienced.   The quality of the prep was adequate, using MoviPrep  The instrument was then slowly withdrawn as the colon was fully examined.   COLON FINDINGS: A diminutive polyp was found in the ascending colon. A polypectomy was performed with a cold snare.  The resection was complete and the polyp tissue was completely retrieved.   The colon mucosa was otherwise normal.  Retroflexed views revealed no abnormalities. The time to cecum=4 minutes 12 seconds.  Withdrawal time=8 minutes 56 seconds.  The scope was withdrawn and the procedure completed. COMPLICATIONS: There were no complications.  ENDOSCOPIC IMPRESSION: 1.   Diminutive polyp was found in the ascending colon; polypectomy was performed with a cold snare 2.   The  colon mucosa was otherwise normal  RECOMMENDATIONS: 1. Repeat colonoscopy in 5 years if polyp adenomatous; otherwise 10 years   eSigned:  Roxy Cedar, MD 05/15/2013 10:49 AM   cc: The Patient and Juluis Rainier, MD

## 2013-05-15 NOTE — Progress Notes (Signed)
Stable to RR 

## 2013-05-16 ENCOUNTER — Telehealth: Payer: Self-pay

## 2013-05-16 NOTE — Telephone Encounter (Signed)
  Follow up Call-  Call back number 05/15/2013  Post procedure Call Back phone  # [phone]  Permission to leave phone message Yes     Patient questions:  Do you have a fever, pain , or abdominal swelling? no Pain Score  0 *  Have you tolerated food without any problems? yes  Have you been able to return to your normal activities? yes  Do you have any questions about your discharge instructions: Diet   no Medications  no Follow up visit  no  Do you have questions or concerns about your Care? no  Actions: * If pain score is 4 or above: No action needed, pain <4.

## 2013-05-20 ENCOUNTER — Encounter: Payer: Self-pay | Admitting: Internal Medicine

## 2013-09-04 ENCOUNTER — Ambulatory Visit (INDEPENDENT_AMBULATORY_CARE_PROVIDER_SITE_OTHER): Payer: 59 | Admitting: Family Medicine

## 2013-09-04 ENCOUNTER — Ambulatory Visit: Payer: 59

## 2013-09-04 VITALS — BP 102/60 | HR 73 | Temp 98.7°F | Resp 16 | Ht 65.0 in | Wt 164.0 lb

## 2013-09-04 DIAGNOSIS — M5030 Other cervical disc degeneration, unspecified cervical region: Secondary | ICD-10-CM

## 2013-09-04 DIAGNOSIS — M62838 Other muscle spasm: Secondary | ICD-10-CM

## 2013-09-04 DIAGNOSIS — R7309 Other abnormal glucose: Secondary | ICD-10-CM

## 2013-09-04 DIAGNOSIS — E161 Other hypoglycemia: Secondary | ICD-10-CM

## 2013-09-04 DIAGNOSIS — M6210 Other rupture of muscle (nontraumatic), unspecified site: Secondary | ICD-10-CM

## 2013-09-04 DIAGNOSIS — M542 Cervicalgia: Secondary | ICD-10-CM

## 2013-09-04 LAB — GLUCOSE, POCT (MANUAL RESULT ENTRY): POC Glucose: 71 mg/dl (ref 70–99)

## 2013-09-04 MED ORDER — METHOCARBAMOL 500 MG PO TABS: 500.0000 mg | ORAL_TABLET | Freq: Four times a day (QID) | ORAL | Status: DC | PRN

## 2013-09-04 MED ORDER — TRAMADOL HCL 50 MG PO TABS: 50.0000 mg | ORAL_TABLET | Freq: Three times a day (TID) | ORAL | Status: DC | PRN

## 2013-09-04 NOTE — Progress Notes (Signed)
Subjective:    Patient ID: [name], female    DOB: 07/07/1941, 73 y.o.   MRN: [id number]   Chief Complaint  Patient presents with  . Neck Pain    x 1 week   HPI  This chart was scribed for [name] by [name], [name]. This patient was seen in room 4 and the patient's care was started at 12:38 PM  HPI Comments: [name] is a 73 y.o. female who presents to [facility] complaining of severe constant neck pain that started 7 days ago.  Pt states she was getting into bed last week when she felt a catch on her left side of her neck and the pain radiated into her left ear and left jaw.  Since then, the neck pain has become bilateral.  She denies any numbness and tingling in her arms currently. No arm weakness or grasp/grip strength.  She reports that in the morning her right hand tingles like it is asleep, but it has been occuring for several months.  Pt states she has taken Tylenol, aspirin, and ibuprofen every 3-4 hours without relief.  She also states that after the incident she had slight blood coming from her ear, but it is possible it was from a bump in her ear canal.   She visits [place] for her chronic low back pain, but states she hasn't visited them for neck pain.  She is currently taking Elavil 50 mg at night, Gabapentin 300 mg tid, and Diclofenac 75 mg (though has stopped the diclofenac this wk in favor of ibuprofen/asa).    Pt had a spinal cord stimulator implanted by Dr. Rolena Infante in 2009.  Pt had a CT scan of her c-spine 2 years ago that showed a disc osteophyte at C4-5 and mild-mod stenosis.   Past Surgical History  Procedure Laterality Date  . Appendectomy  1952  . Vaginal hysterectomy  1977  . Knee arthroscopy  2003 2005    right knee  . Mass fallopian tube    . Incontinence surgery      Family History  Problem Relation Age of Onset  . Colon cancer Neg Hx     History   Social History  . Marital Status: Married    Spouse Name: N/A    Number of Children: N/A  . Years of Education: N/A   Occupational History  . Not on file.   Social History Main Topics  . Smoking status: Never Smoker   . Smokeless tobacco: Never Used  . Alcohol Use: No  . Drug Use: No  . Sexual Activity: Not on file   Other Topics Concern  . Not on file   Social History Narrative  . No narrative on file    Allergies  Allergen Reactions  . Vasotec Shortness Of Breath and Other (See Comments)    wheezing  . Codeine Nausea And Vomiting  . Lansoprazole Itching, Rash and Other (See Comments)    redness  . Morphine And Related Itching  . Sulfate Itching    There are no active problems to display for this patient.   Review of Systems  Constitutional: Negative for fever and chills.  HENT: Negative for congestion and rhinorrhea.   Respiratory: Negative for cough and shortness of breath.   Cardiovascular: Negative for chest pain.  Gastrointestinal: Negative for nausea, vomiting, abdominal pain and diarrhea.  Musculoskeletal: Positive for arthralgias, back pain, myalgias, neck pain and neck stiffness. Negative for gait  problem and joint swelling.  Skin: Negative for color change and rash.  Neurological: Negative for tremors, syncope, weakness and numbness.  Hematological: Negative for adenopathy.  Psychiatric/Behavioral: Positive for sleep disturbance.    Triage Vitals: BP 102/60  Pulse 73  Temp(Src) 98.7 F (37.1 C) (Oral)  Resp 16  Ht 5\' 5"  (1.651 m)  Wt 164 lb (74.39 kg)  BMI 27.29 kg/m2  SpO2 97% Objective:   Physical Exam  Nursing note and vitals reviewed. Constitutional: She is oriented to person, place, and time. She appears well-developed and well-nourished. No distress.  HENT:  Head: Normocephalic and atraumatic.  Right Ear: Tympanic membrane, external ear and ear canal normal.  Left Ear: Tympanic membrane, external ear and ear canal normal.  Nose: Nose normal.  Mouth/Throat: Uvula is midline. No  oropharyngeal exudate, posterior oropharyngeal edema or posterior oropharyngeal erythema.  Eyes: EOM are normal.  Neck: Trachea normal. Neck supple. Muscular tenderness present. No spinous process tenderness present. No rigidity. Erythema and decreased range of motion present. No tracheal deviation present. No thyromegaly present.  Cardiovascular: Normal rate.   Pulmonary/Chest: Effort normal. No respiratory distress.  Musculoskeletal:       Cervical back: She exhibits decreased range of motion, tenderness, pain and spasm.  Tenderness over the left sternocleidomastoid muscle and left paraspinal muscles. No pain over cervical spine or right paraspinal.  Mildly reduced flexion of neck Moderately reduced extension of neck. Moderately reduced lateral flexion of neck. Moderately reduced lateral rotation of neck.  Neurological: She is alert and oriented to person, place, and time.  Reflex Scores:      Tricep reflexes are 2+ on the right side and 2+ on the left side.      Bicep reflexes are 2+ on the right side and 2+ on the left side.      Brachioradialis reflexes are 2+ on the right side and 2+ on the left side. Skin: Skin is warm and dry.  Psychiatric: She has a normal mood and affect. Her behavior is normal.   Primary X-ray reading by Dr. Brigitte Pulse C-spine: Diffuse severe degenerative and osteoarthritis.    EXAM: CERVICAL SPINE 4+ VIEWS  COMPARISON: 09/19/2011  FINDINGS: The alignment of the cervical spine appears within normal limits. The vertebral body heights are well preserved. Multi level disc space narrowing and ventral endplate spurring noted compatible with spondylosis. Bilateral foraminal narrowing is identified. The no fractures or subluxations identified.  IMPRESSION: 1. Cervical degenerative disc disease with associated bilateral foraminal narrowing. Assessment & Plan:    Informed pt that her neck x-ray shows arthritis.  Made a cd with her x-rays to take to [place].  Explained to pt that between heat and muscle relaxants she needs to continue to move her neck.   Neck pain - Plan: DG Cervical Spine Complete  Cervicalgia  Degeneration of cervical intervertebral disc  Muscle spasms of neck - rec trial of methocarbamol, heat, and gentle stretching. Can try prn tramadol if needs additional pain control. Restart diclofenac and stop otc ibuprofen.  Rec sched f/u visit w/ [place] in case pain more prolonged and persistent or does not respond readily to these conservative measures - pt given disc of xray done today.  Abnormal blood sugar - Plan: POCT glucose (manual entry)  Hypoglycemia due to insulin, initial encounter - Pt began to feel weaky, shakey, lightheaded during OV - given crackers and juice - cbg then 71 so likely was lower - she felt well enough to leave w/ her  husband who was driving at this point.  Meds ordered this encounter  Medications  . methocarbamol (ROBAXIN) 500 MG tablet    Sig: Take 1-2 tablets (500-1,000 mg total) by mouth 4 (four) times daily as needed for muscle spasms.    Dispense:  60 tablet    Refill:  0  . traMADol (ULTRAM) 50 MG tablet    Sig: Take 1 tablet (50 mg total) by mouth every 8 (eight) hours as needed for moderate pain.    Dispense:  20 tablet    Refill:  0    I personally performed the services described in this documentation, which was scribed in my presence. The recorded information has been reviewed and considered, and addended by me as needed.  Delman Cheadle, MD MPH

## 2013-09-04 NOTE — Patient Instructions (Signed)
Cervical Strain and Sprain (Whiplash)  with Rehab  Cervical strain and sprains are injuries that commonly occur with "whiplash" injuries. Whiplash occurs when the neck is forcefully whipped backward or forward, such as during a motor vehicle accident. The muscles, ligaments, tendons, discs and nerves of the neck are susceptible to injury when this occurs.  SYMPTOMS   · Pain or stiffness in the front and/or back of neck  · Symptoms may present immediately or up to 24 hours after injury.  · Dizziness, headache, nausea and vomiting.  · Muscle spasm with soreness and stiffness in the neck.  · Tenderness and swelling at the injury site.  CAUSES   Whiplash injuries often occur during contact sports or motor vehicle accidents.   RISK INCREASES WITH:  · Osteoarthritis of the spine.  · Situations that make head or neck accidents or trauma more likely.  · High-risk sports (football, rugby, wrestling, hockey, auto racing, gymnastics, diving, contact karate or boxing).  · Poor strength and flexibility of the neck.  · Previous neck injury.  · Poor tackling technique.  · Improperly fitted or padded equipment.  PREVENTION  · Learn and use proper technique (avoid tackling with the head, spearing and head-butting; use proper falling techniques to avoid landing on the head).  · Warm up and stretch properly before activity.  · Maintain physical fitness:  · Strength, flexibility and endurance.  · Cardiovascular fitness.  · Wear properly fitted and padded protective equipment, such as padded soft collars, for participation in contact sports.  PROGNOSIS   Recovery for cervical strain and sprain injuries is dependent on the extent of the injury. These injuries are usually curable in 1 week to 3 months with appropriate treatment.   RELATED COMPLICATIONS   · Temporary numbness and weakness may occur if the nerve roots are damaged, and this may persist until the nerve has completely healed.  · Chronic pain due to frequent recurrence of  symptoms.  · Prolonged healing, especially if activity is resumed too soon (before complete recovery).  TREATMENT   Treatment initially involves the use of ice and medication to help reduce pain and inflammation. It is also important to perform strengthening and stretching exercises and modify activities that worsen symptoms so the injury does not get worse. These exercises may be performed at home or with a therapist. For patients who experience severe symptoms, a soft padded collar may be recommended to be worn around the neck.   Improving your posture may help reduce symptoms. Posture improvement includes pulling your chin and abdomen in while sitting or standing. If you are sitting, sit in a firm chair with your buttocks against the back of the chair. While sleeping, try replacing your pillow with a small towel rolled to 2 inches in diameter, or use a cervical pillow or soft cervical collar. Poor sleeping positions delay healing.   For patients with nerve root damage, which causes numbness or weakness, the use of a cervical traction apparatus may be recommended. Surgery is rarely necessary for these injuries. However, cervical strain and sprains that are present at birth (congenital) may require surgery.  MEDICATION   · If pain medication is necessary, nonsteroidal anti-inflammatory medications, such as aspirin and ibuprofen, or other minor pain relievers, such as acetaminophen, are often recommended.  · Do not take pain medication for 7 days before surgery.  · Prescription pain relievers may be given if deemed necessary by your caregiver. Use only as directed and only as much as you   need.  HEAT AND COLD:   · Cold treatment (icing) relieves pain and reduces inflammation. Cold treatment should be applied for 10 to 15 minutes every 2 to 3 hours for inflammation and pain and immediately after any activity that aggravates your symptoms. Use ice packs or an ice massage.  · Heat treatment may be used prior to  performing the stretching and strengthening activities prescribed by your caregiver, physical therapist, or athletic trainer. Use a heat pack or a warm soak.  SEEK MEDICAL CARE IF:   · Symptoms get worse or do not improve in 2 weeks despite treatment.  · New, unexplained symptoms develop (drugs used in treatment may produce side effects).  EXERCISES  RANGE OF MOTION (ROM) AND STRETCHING EXERCISES - Cervical Strain and Sprain  These exercises may help you when beginning to rehabilitate your injury. In order to successfully resolve your symptoms, you must improve your posture. These exercises are designed to help reduce the forward-head and rounded-shoulder posture which contributes to this condition. Your symptoms may resolve with or without further involvement from your physician, physical therapist or athletic trainer. While completing these exercises, remember:   · Restoring tissue flexibility helps normal motion to return to the joints. This allows healthier, less painful movement and activity.  · An effective stretch should be held for at least 20 seconds, although you may need to begin with shorter hold times for comfort.  · A stretch should never be painful. You should only feel a gentle lengthening or release in the stretched tissue.  STRETCH- Axial Extensors  · Lie on your back on the floor. You may bend your knees for comfort. Place a rolled up hand towel or dish towel, about 2 inches in diameter, under the part of your head that makes contact with the floor.  · Gently tuck your chin, as if trying to make a "double chin," until you feel a gentle stretch at the base of your head.  · Hold __________ seconds.  Repeat __________ times. Complete this exercise __________ times per day.   STRETECH - Axial Extension   · Stand or sit on a firm surface. Assume a good posture: chest up, shoulders drawn back, abdominal muscles slightly tense, knees unlocked (if standing) and feet hip width apart.  · Slowly retract your  chin so your head slides back and your chin slightly lowers.Continue to look straight ahead.  · You should feel a gentle stretch in the back of your head. Be certain not to feel an aggressive stretch since this can cause headaches later.  · Hold for __________ seconds.  Repeat __________ times. Complete this exercise __________ times per day.  STRETCH  Cervical Side Bend   · Stand or sit on a firm surface. Assume a good posture: chest up, shoulders drawn back, abdominal muscles slightly tense, knees unlocked (if standing) and feet hip width apart.  · Without letting your nose or shoulders move, slowly tip your right / left ear to your shoulder until your feel a gentle stretch in the muscles on the opposite side of your neck.  · Hold __________ seconds.  Repeat __________ times. Complete this exercise __________ times per day.  STRETCH  Cervical Rotators   · Stand or sit on a firm surface. Assume a good posture: chest up, shoulders drawn back, abdominal muscles slightly tense, knees unlocked (if standing) and feet hip width apart.  · Keeping your eyes level with the ground, slowly turn your head until you feel a gentle   stretch along the back and opposite side of your neck.  · Hold __________ seconds.  Repeat __________ times. Complete this exercise __________ times per day.  RANGE OF MOTION - Neck Circles   · Stand or sit on a firm surface. Assume a good posture: chest up, shoulders drawn back, abdominal muscles slightly tense, knees unlocked (if standing) and feet hip width apart.  · Gently roll your head down and around from the back of one shoulder to the back of the other. The motion should never be forced or painful.  · Repeat the motion 10-20 times, or until you feel the neck muscles relax and loosen.  Repeat __________ times. Complete the exercise __________ times per day.  STRENGTHENING EXERCISES - Cervical Strain and Sprain  These exercises may help you when beginning to rehabilitate your injury. They may  resolve your symptoms with or without further involvement from your physician, physical therapist or athletic trainer. While completing these exercises, remember:   · Muscles can gain both the endurance and the strength needed for everyday activities through controlled exercises.  · Complete these exercises as instructed by your physician, physical therapist or athletic trainer. Progress the resistance and repetitions only as guided.  · You may experience muscle soreness or fatigue, but the pain or discomfort you are trying to eliminate should never worsen during these exercises. If this pain does worsen, stop and make certain you are following the directions exactly. If the pain is still present after adjustments, discontinue the exercise until you can discuss the trouble with your clinician.  STRENGTH Cervical Flexors, Isometric  · Face a wall, standing about 6 inches away. Place a small pillow, a ball about 6-8 inches in diameter, or a folded towel between your forehead and the wall.  · Slightly tuck your chin and gently push your forehead into the soft object. Push only with mild to moderate intensity, building up tension gradually. Keep your jaw and forehead relaxed.  · Hold 10 to 20 seconds. Keep your breathing relaxed.  · Release the tension slowly. Relax your neck muscles completely before you start the next repetition.  Repeat __________ times. Complete this exercise __________ times per day.  STRENGTH- Cervical Lateral Flexors, Isometric   · Stand about 6 inches away from a wall. Place a small pillow, a ball about 6-8 inches in diameter, or a folded towel between the side of your head and the wall.  · Slightly tuck your chin and gently tilt your head into the soft object. Push only with mild to moderate intensity, building up tension gradually. Keep your jaw and forehead relaxed.  · Hold 10 to 20 seconds. Keep your breathing relaxed.  · Release the tension slowly. Relax your neck muscles completely before  you start the next repetition.  Repeat __________ times. Complete this exercise __________ times per day.  STRENGTH  Cervical Extensors, Isometric   · Stand about 6 inches away from a wall. Place a small pillow, a ball about 6-8 inches in diameter, or a folded towel between the back of your head and the wall.  · Slightly tuck your chin and gently tilt your head back into the soft object. Push only with mild to moderate intensity, building up tension gradually. Keep your jaw and forehead relaxed.  · Hold 10 to 20 seconds. Keep your breathing relaxed.  · Release the tension slowly. Relax your neck muscles completely before you start the next repetition.  Repeat __________ times. Complete this exercise __________ times per   day.  POSTURE AND BODY MECHANICS CONSIDERATIONS - Cervical Strain and Sprain  Keeping correct posture when sitting, standing or completing your activities will reduce the stress put on different body tissues, allowing injured tissues a chance to heal and limiting painful experiences. The following are general guidelines for improved posture. Your physician or physical therapist will provide you with any instructions specific to your needs. While reading these guidelines, remember:  · The exercises prescribed by your provider will help you have the flexibility and strength to maintain correct postures.  · The correct posture provides the optimal environment for your joints to work. All of your joints have less wear and tear when properly supported by a spine with good posture. This means you will experience a healthier, less painful body.  · Correct posture must be practiced with all of your activities, especially prolonged sitting and standing. Correct posture is as important when doing repetitive low-stress activities (typing) as it is when doing a single heavy-load activity (lifting).  PROLONGED STANDING WHILE SLIGHTLY LEANING FORWARD  When completing a task that requires you to lean forward while  standing in one place for a long time, place either foot up on a stationary 2-4 inch high object to help maintain the best posture. When both feet are on the ground, the low back tends to lose its slight inward curve. If this curve flattens (or becomes too large), then the back and your other joints will experience too much stress, fatigue more quickly and can cause pain.   RESTING POSITIONS  Consider which positions are most painful for you when choosing a resting position. If you have pain with flexion-based activities (sitting, bending, stooping, squatting), choose a position that allows you to rest in a less flexed posture. You would want to avoid curling into a fetal position on your side. If your pain worsens with extension-based activities (prolonged standing, working overhead), avoid resting in an extended position such as sleeping on your stomach. Most people will find more comfort when they rest with their spine in a more neutral position, neither too rounded nor too arched. Lying on a non-sagging bed on your side with a pillow between your knees, or on your back with a pillow under your knees will often provide some relief. Keep in mind, being in any one position for a prolonged period of time, no matter how correct your posture, can still lead to stiffness.  WALKING  Walk with an upright posture. Your ears, shoulders and hips should all line-up.  OFFICE WORK  When working at a desk, create an environment that supports good, upright posture. Without extra support, muscles fatigue and lead to excessive strain on joints and other tissues.  CHAIR:  · A chair should be able to slide under your desk when your back makes contact with the back of the chair. This allows you to work closely.  · The chair's height should allow your eyes to be level with the upper part of your monitor and your hands to be slightly lower than your elbows.  · Body position:  · Your feet should make contact with the floor. If this is  not possible, use a foot rest.  · Keep your ears over your shoulders. This will reduce stress on your neck and low back.  Document Released: 08/14/2005 Document Revised: 12/09/2012 Document Reviewed: 11/26/2008  [employer]® Patient Information ©2014 [employer].

## 2013-09-07 ENCOUNTER — Emergency Department (HOSPITAL_COMMUNITY): Payer: Medicare Other

## 2013-09-07 ENCOUNTER — Observation Stay (HOSPITAL_COMMUNITY)
Admission: EM | Admit: 2013-09-07 | Discharge: 2013-09-10 | Disposition: A | Discharge status: Home / Self Care | Payer: Medicare Other | Attending: Internal Medicine | Admitting: Internal Medicine

## 2013-09-07 ENCOUNTER — Encounter (HOSPITAL_COMMUNITY): Payer: Self-pay | Admitting: Emergency Medicine

## 2013-09-07 DIAGNOSIS — Z794 Long term (current) use of insulin: Secondary | ICD-10-CM | POA: Insufficient documentation

## 2013-09-07 DIAGNOSIS — F329 Major depressive disorder, single episode, unspecified: Secondary | ICD-10-CM

## 2013-09-07 DIAGNOSIS — J189 Pneumonia, unspecified organism: Secondary | ICD-10-CM

## 2013-09-07 DIAGNOSIS — E1142 Type 2 diabetes mellitus with diabetic polyneuropathy: Secondary | ICD-10-CM

## 2013-09-07 DIAGNOSIS — R6889 Other general symptoms and signs: Secondary | ICD-10-CM | POA: Diagnosis present

## 2013-09-07 DIAGNOSIS — F32A Depression, unspecified: Secondary | ICD-10-CM | POA: Diagnosis present

## 2013-09-07 DIAGNOSIS — R0902 Hypoxemia: Secondary | ICD-10-CM

## 2013-09-07 DIAGNOSIS — J45909 Unspecified asthma, uncomplicated: Secondary | ICD-10-CM

## 2013-09-07 DIAGNOSIS — I10 Essential (primary) hypertension: Secondary | ICD-10-CM

## 2013-09-07 DIAGNOSIS — D638 Anemia in other chronic diseases classified elsewhere: Secondary | ICD-10-CM | POA: Insufficient documentation

## 2013-09-07 DIAGNOSIS — J111 Influenza due to unidentified influenza virus with other respiratory manifestations: Principal | ICD-10-CM | POA: Insufficient documentation

## 2013-09-07 DIAGNOSIS — F3289 Other specified depressive episodes: Secondary | ICD-10-CM | POA: Insufficient documentation

## 2013-09-07 DIAGNOSIS — E1149 Type 2 diabetes mellitus with other diabetic neurological complication: Secondary | ICD-10-CM | POA: Insufficient documentation

## 2013-09-07 DIAGNOSIS — Z79899 Other long term (current) drug therapy: Secondary | ICD-10-CM | POA: Insufficient documentation

## 2013-09-07 DIAGNOSIS — Z66 Do not resuscitate: Secondary | ICD-10-CM | POA: Insufficient documentation

## 2013-09-07 DIAGNOSIS — R42 Dizziness and giddiness: Secondary | ICD-10-CM | POA: Insufficient documentation

## 2013-09-07 DIAGNOSIS — E785 Hyperlipidemia, unspecified: Secondary | ICD-10-CM | POA: Insufficient documentation

## 2013-09-07 LAB — GLUCOSE, CAPILLARY
GLUCOSE-CAPILLARY: 102 mg/dL — AB (ref 70–99)
Glucose-Capillary: 168 mg/dL — ABNORMAL HIGH (ref 70–99)

## 2013-09-07 LAB — COMPREHENSIVE METABOLIC PANEL
ALT: 27 U/L (ref 0–35)
AST: 33 U/L (ref 0–37)
Albumin: 4 g/dL (ref 3.5–5.2)
Alkaline Phosphatase: 103 U/L (ref 39–117)
BUN: 16 mg/dL (ref 6–23)
CALCIUM: 9.5 mg/dL (ref 8.4–10.5)
CO2: 27 meq/L (ref 19–32)
CREATININE: 0.97 mg/dL (ref 0.50–1.10)
Chloride: 94 mEq/L — ABNORMAL LOW (ref 96–112)
GFR calc Af Amer: 66 mL/min — ABNORMAL LOW (ref >90)
GFR calc non Af Amer: 57 mL/min — ABNORMAL LOW (ref >90)
Glucose, Bld: 126 mg/dL — ABNORMAL HIGH (ref 70–99)
Potassium: 4.3 mEq/L (ref 3.7–5.3)
Sodium: 134 mEq/L — ABNORMAL LOW (ref 137–147)
Total Bilirubin: 0.2 mg/dL — ABNORMAL LOW (ref 0.3–1.2)
Total Protein: 7.6 g/dL (ref 6.0–8.3)

## 2013-09-07 LAB — CBC WITH DIFFERENTIAL/PLATELET
BASOS PCT: 0 % (ref 0–1)
Basophils Absolute: 0 10*3/uL (ref 0.0–0.1)
EOS ABS: 0 10*3/uL (ref 0.0–0.7)
EOS PCT: 0 % (ref 0–5)
HEMATOCRIT: 36 % (ref 36.0–46.0)
Hemoglobin: 11.6 g/dL — ABNORMAL LOW (ref 12.0–15.0)
Lymphocytes Relative: 5 % — ABNORMAL LOW (ref 12–46)
Lymphs Abs: 0.4 10*3/uL — ABNORMAL LOW (ref 0.7–4.0)
MCH: 27.2 pg (ref 26.0–34.0)
MCHC: 32.2 g/dL (ref 30.0–36.0)
MCV: 84.5 fL (ref 78.0–100.0)
MONO ABS: 1 10*3/uL (ref 0.1–1.0)
Monocytes Relative: 12 % (ref 3–12)
NEUTROS ABS: 6.9 10*3/uL (ref 1.7–7.7)
Neutrophils Relative %: 82 % — ABNORMAL HIGH (ref 43–77)
Platelets: 223 10*3/uL (ref 150–400)
RBC: 4.26 MIL/uL (ref 3.87–5.11)
RDW: 14.7 % (ref 11.5–15.5)
WBC: 8.4 10*3/uL (ref 4.0–10.5)

## 2013-09-07 LAB — CG4 I-STAT (LACTIC ACID): LACTIC ACID, VENOUS: 1.6 mmol/L (ref 0.5–2.2)

## 2013-09-07 MED ORDER — SODIUM CHLORIDE 0.9 % IV BOLUS (SEPSIS)
1000.0000 mL | Freq: Once | INTRAVENOUS | Status: AC
  Administered 2013-09-07: 1000 mL via INTRAVENOUS

## 2013-09-07 MED ORDER — SODIUM CHLORIDE 0.9 % IV SOLN: Freq: Once | INTRAVENOUS | Status: DC

## 2013-09-07 MED ORDER — OSELTAMIVIR PHOSPHATE 75 MG PO CAPS
75.0000 mg | ORAL_CAPSULE | Freq: Once | ORAL | Status: AC
  Administered 2013-09-07: 75 mg via ORAL
  Filled 2013-09-07: qty 1

## 2013-09-07 MED ORDER — ACETAMINOPHEN 325 MG PO TABS
650.0000 mg | ORAL_TABLET | Freq: Once | ORAL | Status: AC
  Administered 2013-09-07: 650 mg via ORAL
  Filled 2013-09-07: qty 2

## 2013-09-07 MED ORDER — DEXTROSE 5 % IV SOLN
500.0000 mg | Freq: Once | INTRAVENOUS | Status: AC
  Administered 2013-09-07: 500 mg via INTRAVENOUS
  Filled 2013-09-07: qty 500

## 2013-09-07 MED ORDER — IPRATROPIUM-ALBUTEROL 0.5-2.5 (3) MG/3ML IN SOLN
3.0000 mL | Freq: Once | RESPIRATORY_TRACT | Status: AC
Start: 2013-09-07 — End: 2013-09-07
  Administered 2013-09-07: 3 mL via RESPIRATORY_TRACT
  Filled 2013-09-07: qty 3

## 2013-09-07 MED ORDER — ONDANSETRON 4 MG PO TBDP
4.0000 mg | ORAL_TABLET | Freq: Once | ORAL | Status: AC
  Administered 2013-09-07: 4 mg via ORAL
  Filled 2013-09-07: qty 1

## 2013-09-07 MED ORDER — DEXTROSE 5 % IV SOLN
1.0000 g | Freq: Once | INTRAVENOUS | Status: AC
  Administered 2013-09-07: 1 g via INTRAVENOUS
  Filled 2013-09-07: qty 10

## 2013-09-07 MED ORDER — ONDANSETRON HCL 4 MG/2ML IJ SOLN
4.0000 mg | Freq: Once | INTRAMUSCULAR | Status: AC
  Administered 2013-09-07: 4 mg via INTRAVENOUS
  Filled 2013-09-07: qty 2

## 2013-09-07 MED ORDER — PREDNISONE 50 MG PO TABS
50.0000 mg | ORAL_TABLET | Freq: Once | ORAL | Status: AC
  Administered 2013-09-07: 50 mg via ORAL
  Filled 2013-09-07: qty 1

## 2013-09-07 NOTE — ED Notes (Addendum)
Pt states that she has been nauseated and dizzy since Friday.  Pt is diabetic.  Last sugar was 94.  Gen body aches/diarrhea also.

## 2013-09-07 NOTE — ED Provider Notes (Signed)
CSN: [id number]     Arrival date & time 09/07/13  1745 History   First MD Initiated Contact with Patient 09/07/13 1754     Chief Complaint  Patient presents with  . Nausea  . Dizziness  . Diarrhea   (Consider location/radiation/quality/duration/timing/severity/associated sxs/prior Treatment) HPI  This is a 73 year old female with history of diabetes, hypertension, and asthma who presents with nausea and diarrhea for the last 2 days. Patient states onset of symptoms was Friday. She reports nausea without vomiting. She reports multiple episodes of diarrhea. She reports general malaise and decreased by mouth intake. She reports subjective chills at home. She was noted to be febrile in triage. She denies any chest pain, shortness of breath, cough. She denies any abdominal pain. She did get a flu shot this year.  Past Medical History  Diagnosis Date  . Diabetes mellitus   . Hypertension   . Asthma   . S/P appy    Past Surgical History  Procedure Laterality Date  . Appendectomy  1952  . Vaginal hysterectomy  1977  . Knee arthroscopy  2003 2005    right knee  . Mass fallopian tube    . Incontinence surgery     Family History  Problem Relation Age of Onset  . Colon cancer Neg Hx    History  Substance Use Topics  . Smoking status: Never Smoker   . Smokeless tobacco: Never Used  . Alcohol Use: No   OB History   Grav Para Term Preterm Abortions TAB SAB Ect Mult Living                 Review of Systems  Constitutional: Negative for fever.  Respiratory: Negative for cough, chest tightness and shortness of breath.   Cardiovascular: Negative for chest pain.  Gastrointestinal: Positive for nausea and diarrhea. Negative for vomiting and abdominal pain.  Genitourinary: Negative for dysuria.  Musculoskeletal: Positive for myalgias. Negative for back pain.  Skin: Negative for wound.  Neurological: Negative for headaches.  Psychiatric/Behavioral: Negative for confusion.  All other  systems reviewed and are negative.    Allergies  Vasotec; Codeine; Lansoprazole; Morphine and related; and Sulfate  Home Medications   No current outpatient prescriptions on file. BP 143/53  Pulse 103  Temp(Src) 100.9 F (38.3 C) (Oral)  Resp 25  SpO2 90% Physical Exam  Nursing note and vitals reviewed. Constitutional: She is oriented to person, place, and time.  Nontoxic-appearing, in no acute distress  HENT:  Head: Normocephalic and atraumatic.  Mucous membranes dry  Eyes: Pupils are equal, round, and reactive to light.  Neck: Neck supple.  Cardiovascular: Normal rate, regular rhythm and normal heart sounds.   Pulmonary/Chest: Effort normal and breath sounds normal. No respiratory distress. She has no wheezes.  Abdominal: Soft. Bowel sounds are normal. There is no tenderness. There is no rebound and no guarding.  Neurological: She is alert and oriented to person, place, and time.  Skin: Skin is warm and dry.  Psychiatric: She has a normal mood and affect.    ED Course  Procedures (including critical care time) Labs Review Labs Reviewed  GLUCOSE, CAPILLARY - Abnormal; Notable for the following:    Glucose-Capillary 102 (*)    All other components within normal limits  CBC WITH DIFFERENTIAL - Abnormal; Notable for the following:    Hemoglobin 11.6 (*)    Neutrophils Relative % 82 (*)    Lymphocytes Relative 5 (*)    Lymphs Abs 0.4 (*)  All other components within normal limits  COMPREHENSIVE METABOLIC PANEL - Abnormal; Notable for the following:    Sodium 134 (*)    Chloride 94 (*)    Glucose, Bld 126 (*)    Total Bilirubin 0.2 (*)    GFR calc non Af Amer 57 (*)    GFR calc Af Amer 66 (*)    All other components within normal limits  GLUCOSE, CAPILLARY - Abnormal; Notable for the following:    Glucose-Capillary 168 (*)    All other components within normal limits  CULTURE, BLOOD (ROUTINE X 2)  CULTURE, BLOOD (ROUTINE X 2)  INFLUENZA PANEL BY PCR (TYPE A &  B, H1N1)  CG4 I-STAT (LACTIC ACID)   Imaging Review Dg Chest 2 View  09/07/2013   CLINICAL DATA:  Shortness of breath  EXAM: CHEST  2 VIEW  COMPARISON:  07/26/2010  FINDINGS: The heart size and mediastinal contours are within normal limits. Both lungs are clear. No effusion or pneumothorax. The visualized skeletal structures are unremarkable. Grossly similar positioning of dorsal column stimulator at the mid thoracic level.  IMPRESSION: No active cardiopulmonary disease.   Electronically Signed   By: Jorje Guild M.D.   On: 09/07/2013 21:08    EKG Interpretation    Date/Time:    Ventricular Rate:    PR Interval:    QRS Duration:   QT Interval:    QTC Calculation:   R Axis:     Text Interpretation:              MDM   1. Flu-like symptoms   2. Hypoxia   3. Community acquired pneumonia    Patient presents with vomiting and diarrhea as well as dizziness. She is noted to be febrile here. She has no abdominal pain on exam. Lab work is grossly unremarkable. She is not orthostatic. Patient was noted to be hypoxic on room air to 90%. She does have a history of asthma. X-rays negative. However, patient is dry and given fever with hypoxia will treat for community-acquired pneumonia.  Patient was given prednisone and DuoNeb as well. She was ambulated and dropped her pulse ox to 70's.  Influenza was sent patient given pamphlet. She will be admitted for further management.    Merryl Hacker, MD 09/07/13 [phone]

## 2013-09-07 NOTE — ED Notes (Signed)
Patient transported to X-ray 

## 2013-09-07 NOTE — ED Notes (Signed)
Pt ambulated w/ 2 assists, O2 dropped to 73, gait unsteady, O2 rebound to 88, Pt back in bed on 2L O2 at 96

## 2013-09-07 NOTE — ED Notes (Signed)
Pt states she is having dizziness and nausea. Pt denies pain at present. Pt alert, no acute distress. Skin warm and dry. Pt has family at bedside.

## 2013-09-07 NOTE — ED Notes (Signed)
RT called for neb tx.

## 2013-09-07 NOTE — ED Notes (Signed)
Attempted to call report to 5 E. RN unable to take report.

## 2013-09-08 ENCOUNTER — Encounter (HOSPITAL_COMMUNITY): Payer: Self-pay | Admitting: *Deleted

## 2013-09-08 DIAGNOSIS — F3289 Other specified depressive episodes: Secondary | ICD-10-CM

## 2013-09-08 DIAGNOSIS — F32A Depression, unspecified: Secondary | ICD-10-CM | POA: Diagnosis present

## 2013-09-08 DIAGNOSIS — R0902 Hypoxemia: Secondary | ICD-10-CM | POA: Diagnosis present

## 2013-09-08 DIAGNOSIS — I10 Essential (primary) hypertension: Secondary | ICD-10-CM | POA: Diagnosis present

## 2013-09-08 DIAGNOSIS — J45909 Unspecified asthma, uncomplicated: Secondary | ICD-10-CM | POA: Diagnosis present

## 2013-09-08 DIAGNOSIS — E1142 Type 2 diabetes mellitus with diabetic polyneuropathy: Secondary | ICD-10-CM | POA: Insufficient documentation

## 2013-09-08 DIAGNOSIS — F329 Major depressive disorder, single episode, unspecified: Secondary | ICD-10-CM | POA: Diagnosis present

## 2013-09-08 LAB — CBC
HEMATOCRIT: 31.4 % — AB (ref 36.0–46.0)
HEMOGLOBIN: 10.1 g/dL — AB (ref 12.0–15.0)
MCH: 27.1 pg (ref 26.0–34.0)
MCHC: 32.2 g/dL (ref 30.0–36.0)
MCV: 84.2 fL (ref 78.0–100.0)
Platelets: 190 10*3/uL (ref 150–400)
RBC: 3.73 MIL/uL — ABNORMAL LOW (ref 3.87–5.11)
RDW: 14.9 % (ref 11.5–15.5)
WBC: 6.5 10*3/uL (ref 4.0–10.5)

## 2013-09-08 LAB — BASIC METABOLIC PANEL
BUN: 17 mg/dL (ref 6–23)
CHLORIDE: 98 meq/L (ref 96–112)
CO2: 25 meq/L (ref 19–32)
Calcium: 8.3 mg/dL — ABNORMAL LOW (ref 8.4–10.5)
Creatinine, Ser: 0.95 mg/dL (ref 0.50–1.10)
GFR calc non Af Amer: 58 mL/min — ABNORMAL LOW (ref >90)
GFR, EST AFRICAN AMERICAN: 68 mL/min — AB (ref >90)
Glucose, Bld: 257 mg/dL — ABNORMAL HIGH (ref 70–99)
POTASSIUM: 4.6 meq/L (ref 3.7–5.3)
Sodium: 135 mEq/L — ABNORMAL LOW (ref 137–147)

## 2013-09-08 LAB — GLUCOSE, CAPILLARY
GLUCOSE-CAPILLARY: 257 mg/dL — AB (ref 70–99)
Glucose-Capillary: 299 mg/dL — ABNORMAL HIGH (ref 70–99)
Glucose-Capillary: 261 mg/dL — ABNORMAL HIGH (ref 70–99)
Glucose-Capillary: 291 mg/dL — ABNORMAL HIGH (ref 70–99)
Glucose-Capillary: 297 mg/dL — ABNORMAL HIGH (ref 70–99)

## 2013-09-08 LAB — INFLUENZA PANEL BY PCR (TYPE A & B)
H1N1FLUPCR: NOT DETECTED
INFLBPCR: NEGATIVE
Influenza A By PCR: POSITIVE — AB

## 2013-09-08 MED ORDER — METOPROLOL TARTRATE 50 MG PO TABS
75.0000 mg | ORAL_TABLET | Freq: Two times a day (BID) | ORAL | Status: DC
  Administered 2013-09-08 – 2013-09-10 (×6): 75 mg via ORAL
  Filled 2013-09-08 (×7): qty 1

## 2013-09-08 MED ORDER — OSELTAMIVIR PHOSPHATE 75 MG PO CAPS
75.0000 mg | ORAL_CAPSULE | ORAL | Status: DC
  Administered 2013-09-08 – 2013-09-09 (×2): 75 mg via ORAL
  Filled 2013-09-08 (×4): qty 1

## 2013-09-08 MED ORDER — PANTOPRAZOLE SODIUM 40 MG PO TBEC
40.0000 mg | DELAYED_RELEASE_TABLET | Freq: Two times a day (BID) | ORAL | Status: DC
  Administered 2013-09-08 – 2013-09-10 (×5): 40 mg via ORAL
  Filled 2013-09-08 (×8): qty 1

## 2013-09-08 MED ORDER — LOSARTAN POTASSIUM 50 MG PO TABS
100.0000 mg | ORAL_TABLET | Freq: Every day | ORAL | Status: DC
  Administered 2013-09-08 – 2013-09-10 (×3): 100 mg via ORAL
  Filled 2013-09-08 (×3): qty 2

## 2013-09-08 MED ORDER — AMLODIPINE BESYLATE 10 MG PO TABS
10.0000 mg | ORAL_TABLET | Freq: Every day | ORAL | Status: DC
  Administered 2013-09-08 – 2013-09-10 (×3): 10 mg via ORAL
  Filled 2013-09-08 (×3): qty 1

## 2013-09-08 MED ORDER — ENOXAPARIN SODIUM 40 MG/0.4ML ~~LOC~~ SOLN
40.0000 mg | SUBCUTANEOUS | Status: DC
  Administered 2013-09-08 – 2013-09-10 (×3): 40 mg via SUBCUTANEOUS
  Filled 2013-09-08 (×3): qty 0.4

## 2013-09-08 MED ORDER — INSULIN ASPART 100 UNIT/ML ~~LOC~~ SOLN
0.0000 [IU] | Freq: Three times a day (TID) | SUBCUTANEOUS | Status: DC
  Administered 2013-09-08 (×3): 5 [IU] via SUBCUTANEOUS
  Administered 2013-09-09: 9 [IU] via SUBCUTANEOUS
  Administered 2013-09-09: 3 [IU] via SUBCUTANEOUS
  Administered 2013-09-09: 1 [IU] via SUBCUTANEOUS
  Administered 2013-09-10: 3 [IU] via SUBCUTANEOUS
  Administered 2013-09-10: 1 [IU] via SUBCUTANEOUS

## 2013-09-08 MED ORDER — SODIUM CHLORIDE 0.9 % IV SOLN
INTRAVENOUS | Status: DC
  Administered 2013-09-08: 1000 mL via INTRAVENOUS

## 2013-09-08 MED ORDER — INSULIN ASPART 100 UNIT/ML ~~LOC~~ SOLN
0.0000 [IU] | Freq: Every day | SUBCUTANEOUS | Status: DC
  Administered 2013-09-08 – 2013-09-09 (×3): 3 [IU] via SUBCUTANEOUS

## 2013-09-08 MED ORDER — AMITRIPTYLINE HCL 50 MG PO TABS
50.0000 mg | ORAL_TABLET | Freq: Every day | ORAL | Status: DC
  Administered 2013-09-08 – 2013-09-09 (×3): 50 mg via ORAL
  Filled 2013-09-08 (×4): qty 1

## 2013-09-08 MED ORDER — TRAMADOL HCL 50 MG PO TABS: 50.0000 mg | ORAL_TABLET | Freq: Three times a day (TID) | ORAL | Status: DC | PRN

## 2013-09-08 MED ORDER — PREDNISONE 50 MG PO TABS
50.0000 mg | ORAL_TABLET | Freq: Every day | ORAL | Status: DC
  Administered 2013-09-08 – 2013-09-09 (×2): 50 mg via ORAL
  Filled 2013-09-08 (×3): qty 1

## 2013-09-08 MED ORDER — GUAIFENESIN 100 MG/5ML PO SOLN
200.0000 mg | ORAL | Status: DC | PRN
  Administered 2013-09-08 – 2013-09-09 (×2): 200 mg via ORAL
  Filled 2013-09-08 (×3): qty 10

## 2013-09-08 MED ORDER — DICLOFENAC SODIUM 75 MG PO TBEC
75.0000 mg | DELAYED_RELEASE_TABLET | Freq: Two times a day (BID) | ORAL | Status: DC
  Administered 2013-09-08 – 2013-09-10 (×6): 75 mg via ORAL
  Filled 2013-09-08 (×7): qty 1

## 2013-09-08 MED ORDER — INSULIN GLARGINE 100 UNIT/ML ~~LOC~~ SOLN
40.0000 [IU] | Freq: Every day | SUBCUTANEOUS | Status: DC
  Administered 2013-09-08 – 2013-09-09 (×3): 40 [IU] via SUBCUTANEOUS
  Filled 2013-09-08 (×4): qty 0.4

## 2013-09-08 MED ORDER — ACETAMINOPHEN 325 MG PO TABS
650.0000 mg | ORAL_TABLET | Freq: Four times a day (QID) | ORAL | Status: DC | PRN
  Administered 2013-09-08 – 2013-09-09 (×2): 650 mg via ORAL
  Filled 2013-09-08 (×2): qty 2

## 2013-09-08 MED ORDER — ONDANSETRON HCL 4 MG PO TABS
4.0000 mg | ORAL_TABLET | Freq: Four times a day (QID) | ORAL | Status: DC | PRN
  Administered 2013-09-09: 4 mg via ORAL
  Filled 2013-09-08 (×2): qty 1

## 2013-09-08 MED ORDER — OSELTAMIVIR PHOSPHATE 75 MG PO CAPS: 75.0000 mg | ORAL_CAPSULE | Freq: Two times a day (BID) | ORAL | Status: DC

## 2013-09-08 MED ORDER — GABAPENTIN 300 MG PO CAPS
300.0000 mg | ORAL_CAPSULE | Freq: Three times a day (TID) | ORAL | Status: DC
  Administered 2013-09-08 – 2013-09-10 (×7): 300 mg via ORAL
  Filled 2013-09-08 (×9): qty 1

## 2013-09-08 MED ORDER — ATORVASTATIN CALCIUM 40 MG PO TABS
40.0000 mg | ORAL_TABLET | Freq: Every day | ORAL | Status: DC
  Administered 2013-09-08 – 2013-09-10 (×3): 40 mg via ORAL
  Filled 2013-09-08 (×3): qty 1

## 2013-09-08 MED ORDER — ACETAMINOPHEN 650 MG RE SUPP: 650.0000 mg | Freq: Four times a day (QID) | RECTAL | Status: DC | PRN

## 2013-09-08 MED ORDER — TRIAMTERENE-HCTZ 37.5-25 MG PO TABS
2.0000 | ORAL_TABLET | Freq: Every day | ORAL | Status: DC
  Administered 2013-09-08 – 2013-09-10 (×3): 2 via ORAL
  Filled 2013-09-08 (×3): qty 2

## 2013-09-08 MED ORDER — ONDANSETRON HCL 4 MG/2ML IJ SOLN
4.0000 mg | Freq: Four times a day (QID) | INTRAMUSCULAR | Status: DC | PRN
Start: 2013-09-08 — End: 2013-09-10

## 2013-09-08 NOTE — Plan of Care (Signed)
Problem: Phase I Progression Outcomes Goal: OOB as tolerated unless otherwise ordered Outcome: Progressing Ambulating to BR with standby assist

## 2013-09-08 NOTE — Progress Notes (Signed)
PHARMACIST - PHYSICIAN COMMUNICATION DR: Doyle Askew CONCERNING:  Oseltamivir 30 mg capsule shortage  DESCRIPTION:  [facility] is experiencing a significant shortage of Oseltamivir 30mg  capsules.    This patient has an order for Oseltamivir (Tamiflu) and has an Estimated Creatinine Clearance: 52.7 ml/min (by C-G formula based on Cr of 0.95).. The package insert for Oseltamivir recommends 30mg  BID x 5 days for patients with CrCl 30-60 ml/min.  To preserve an adequate supply of 30mg  capsules for our patients with more significant renal impairment the Infectious Disease team has recommended to substitute Oseltamivir 75mg  qday x 5 days for patients with CrCl 30-60 ml/min at this time.   RECOMMENDATION: Oseltamivir 75mg  PO DAILY x 5 days has been substituted for your patient. If you have any questions about this temporary substitution please feel free to call the Pharmacy at [phone] for assistance.  Peggyann Juba, PharmD, BCPS 09/08/2013 8:30 AM

## 2013-09-08 NOTE — H&P (Signed)
[facility] History and Physical  MYCHAEL SOOTS VPX:[id number] DOB: May 07, 1941 DOA: 09/07/2013  Referring physician: Thayer Jew, ER physician PCP: [name], MD   Chief Complaint: Weakness  HPI: [name] is a 73 y.o. female  With past medical history hypertension, diabetes mellitus and asthma who for the past few days has been having complaints of generalized body aches, dizziness and mild diarrhea. When she came into the emergency room, her lab work was unremarkable however she was noted to be 92% on room air and with ambulation and further drops in her oxygenation and the low 80s. Chest x-ray was unremarkable but she does noted to have a temperature of 102.6. Was highly suspected, especially with complaints of diarrhea, but she had the flu. Patient was given a dose of Tamiflu and flu PCR was sent. She was also given nebulizers, supplemental oxygen and steroids and her breathing has mildly improved, but with exertion, her oxygen saturations dropped again. She is normally not on home oxygen. Hospitalists were called for further evaluation and admission.   Review of Systems:  Patient seen after arrival to floor. Breathing is steady, but then she starts to cough and has severe coughing spell. Nonproductive. She otherwise is doing okay. She denies any headaches, vision changes, dysphagia, chest pain or palpitations. Does complain of wheezing and some mild shortness of breath. Denies abdominal pain, hematuria, dysuria, constipation. She's been having some episodes of mild diarrhea for the past 2 days. She does so generally weak, but no focal extremity numbness or weakness or pain. She feels a little lightheaded when sitting up. Review systems otherwise negative.  Past Medical History  Diagnosis Date  . Diabetes mellitus   . Hypertension   . Asthma   . S/P appy    hyperlipidemia Depression  Past Surgical History  Procedure Laterality Date  . Appendectomy  1952   . Vaginal hysterectomy  1977  . Knee arthroscopy  2003 2005    right knee  . Mass fallopian tube    . Incontinence surgery     Social History:  reports that she has never smoked. She has never used smokeless tobacco. She reports that she does not drink alcohol or use illicit drugs. patient was at home with her husband. Normally she is able to participate in most activities of daily living without assistance  Allergies  Allergen Reactions  . Vasotec Shortness Of Breath and Other (See Comments)    wheezing  . Codeine Nausea And Vomiting  . Lansoprazole Itching, Rash and Other (See Comments)    redness  . Morphine And Related Itching  . Sulfate Itching    Family History  Problem Relation Age of Onset  . Colon cancer Neg Hx      Prior to Admission medications   Medication Sig Start Date End Date Taking? Authorizing Provider  Albuterol Sulfate (PROAIR HFA IN) Inhale into the lungs as needed.   Yes Historical Provider, MD  amitriptyline (ELAVIL) 50 MG tablet Take 50 mg by mouth at bedtime.   Yes Historical Provider, MD  amLODipine (NORVASC) 10 MG tablet Take 10 mg by mouth daily.   Yes Historical Provider, MD  atorvastatin (LIPITOR) 40 MG tablet Take 40 mg by mouth daily.   Yes Historical Provider, MD  budesonide-formoterol (SYMBICORT) 160-4.5 MCG/ACT inhaler Inhale 2 puffs into the lungs as needed.   Yes Historical Provider, MD  diclofenac (VOLTAREN) 75 MG EC tablet Take 75 mg by mouth 2 (two) times daily.   Yes Historical  Provider, MD  gabapentin (NEURONTIN) 300 MG capsule Take 300 mg by mouth 3 (three) times daily.   Yes Historical Provider, MD  Insulin Glargine (LANTUS SOLOSTAR [place]) Inject 56 Units into the skin at bedtime.   Yes Historical Provider, MD  insulin lispro (HUMALOG) 100 UNIT/ML SOCT Inject 14 Units into the skin daily after breakfast.   Yes Historical Provider, MD  insulin lispro (HUMALOG) 100 UNIT/ML SOCT Inject 20 Units into the skin daily before lunch.   Yes  Historical Provider, MD  insulin lispro (HUMALOG) 100 UNIT/ML SOCT Inject 20 Units into the skin daily before supper.   Yes Historical Provider, MD  losartan (COZAAR) 100 MG tablet Take 100 mg by mouth daily.   Yes Historical Provider, MD  methocarbamol (ROBAXIN) 500 MG tablet Take 1-2 tablets (500-1,000 mg total) by mouth 4 (four) times daily as needed for muscle spasms. 09/04/13  Yes Shawnee Knapp, MD  METOPROLOL TARTRATE PO Take 75 mg by mouth 2 (two) times daily.   Yes Historical Provider, MD  Multiple Vitamins-Minerals (PRESERVISION AREDS 2 PO) Take by mouth 2 (two) times daily.   Yes Historical Provider, MD  omeprazole (PRILOSEC) 20 MG capsule Take 20 mg by mouth 2 (two) times daily.   Yes Historical Provider, MD  traMADol (ULTRAM) 50 MG tablet Take 1 tablet (50 mg total) by mouth every 8 (eight) hours as needed for moderate pain. 09/04/13  Yes Shawnee Knapp, MD  triamterene-hydrochlorothiazide (DYAZIDE) 37.5-25 MG per capsule Take 2 capsules by mouth every morning.   Yes Historical Provider, MD   Physical Exam: Filed Vitals:   09/07/13 2357  BP: 135/106  Pulse: 100  Temp: 99.2 F (37.3 C)  Resp: 20    BP 135/106  Pulse 100  Temp(Src) 99.2 F (37.3 C) (Oral)  Resp 20  Ht 5\' 4"  (1.626 m)  Wt 73.9 kg (162 lb 14.7 oz)  BMI 27.95 kg/m2  SpO2 100%  General:  Alert nourished x3, no acute distress, fatigued HEENT: Normocephalic, atraumatic, mucous membranes are slightly dry Cardiovascular: Regular rate and rhythm, S1-S2 Lungs: Actually good airway flow in most of her wheezing is more upper airway. Abdomen: Soft, obese, nontender, positive bowel sounds Extremities: No clubbing or cyanosis or edema Neuro: No focal deficits Psych: Patient is appropriate, no evidence of psychoses           Labs on Admission:  Basic Metabolic Panel:  Recent Labs Lab 09/07/13 1815  NA 134*  K 4.3  CL 94*  CO2 27  GLUCOSE 126*  BUN 16  CREATININE 0.97  CALCIUM 9.5   Liver Function  Tests:  Recent Labs Lab 09/07/13 1815  AST 33  ALT 27  ALKPHOS 103  BILITOT 0.2*  PROT 7.6  ALBUMIN 4.0   No results found for this basename: LIPASE, AMYLASE,  in the last 168 hours No results found for this basename: AMMONIA,  in the last 168 hours CBC:  Recent Labs Lab 09/07/13 1815  WBC 8.4  NEUTROABS 6.9  HGB 11.6*  HCT 36.0  MCV 84.5  PLT 223   Cardiac Enzymes: No results found for this basename: CKTOTAL, CKMB, CKMBINDEX, TROPONINI,  in the last 168 hours  BNP (last 3 results) No results found for this basename: PROBNP,  in the last 8760 hours CBG:  Recent Labs Lab 09/07/13 1809 09/07/13 2120  GLUCAP 102* 168*    Radiological Exams on Admission: Dg Chest 2 View  09/07/2013   CLINICAL DATA:  Shortness of breath  EXAM: CHEST  2 VIEW  COMPARISON:  07/26/2010  FINDINGS: The heart size and mediastinal contours are within normal limits. Both lungs are clear. No effusion or pneumothorax. The visualized skeletal structures are unremarkable. Grossly similar positioning of dorsal column stimulator at the mid thoracic level.  IMPRESSION: No active cardiopulmonary disease.   Electronically Signed   By: Jorje Guild M.D.   On: 09/07/2013 21:08     Assessment/Plan Active Problems:   Flu-like symptoms: Placed on droplet isolation. Flu PCR pending. Tamiflu for now.    Asthma, chronic with mild exacerbation, likely caused by possible flu: Wheezing mostly upper airway, so we'll use by mouth prednisone plus nebulizers. Supplemental oxygen    Hypoxia: As above  Hyperlipidemia: Continue statin.  Diabetes mellitus 2 with no complications: Continue sliding scale plus reduced dose Lantus  Hypertension: Continue home meds  Depression: Continue Elavil  Code Status: DO NOT RESUSCITATE.  Family Communication: Husband bedside.  Disposition Plan: A feeling better, may be able to go home in the next one to 2 days  Time spent: 35 minutes  [facility] Pager [phone]

## 2013-09-08 NOTE — Progress Notes (Signed)
Patient ID: [name], female   DOB: [birth year], 73 y.o.   MRN: [id number]  [facility] PROGRESS NOTE  JEANIFER HALLIDAY BPZ:[id number] DOB: April 16, 1941 DOA: 09/07/2013 PCP: [name], MD  Brief narrative: 73 y.o. female with past medical history hypertension, diabetes mellitus and asthma who presented to [facility] ED with several days duration of generalized body aches, subjective fevers, chills, cough productive of yellow sputum, non bloody but watery diarrhea. [facility] asked to admit for further evaluation.   Active Problems:   Flu-like symptoms - Influenza A positive - pt is clinically improving - will continue Tamiflu and supportive care with nebulizers, oxygen as needed   Asthma, chronic - maintaining oxygen saturation at target range - will continue Prednisone, BD's    HTN (hypertension) - reasonable inpatient control  - continue home regimen with Norvasc, Metoprolol, Losartan, Triamterene - HCTZ   Depression - appears to be stable and at baseline    DM type 2 with diabetic peripheral neuropathy - reasonable inpatient control - continue Lantus as per home medical regimen  - continue SSI while inpatient   Consultants:  None  Procedures/Studies: Dg Chest 2 View   09/07/2013   No active cardiopulmonary disease.   Antibiotics:  Given one dose of Zithromax and Rocephin in ED on arrival   Code Status: DNR Family Communication: Pt at bedside Disposition Plan: Home when medically stable  HPI/Subjective: No events overnight.   Objective: Filed Vitals:   09/08/13 0621 09/08/13 1109 09/08/13 1404 09/08/13 2220  BP: 126/71 138/68 120/68 123/67  Pulse: 70 80 73 77  Temp: 97.9 F (36.6 C) 98.7 F (37.1 C) 98.7 F (37.1 C) 98.7 F (37.1 C)  TempSrc: Oral Oral Oral Oral  Resp: 18 20 18 18   Height:      Weight:      SpO2: 94% 95% 93% 95%    Intake/Output Summary (Last 24 hours) at 09/08/13 2243 Last data filed at 09/08/13 1800  Gross per 24 hour   Intake 926.67 ml  Output      0 ml  Net 926.67 ml    Exam:   General:  Pt is alert, follows commands appropriately, not in acute distress  Cardiovascular: Regular rate and rhythm, S1/S2, no murmurs, no rubs, no gallops  Respiratory: Clear to auscultation bilaterally, no wheezing, no crackles, no rhonchi  Abdomen: Soft, non tender, non distended, bowel sounds present, no guarding  Extremities: No edema, pulses DP and PT palpable bilaterally  Neuro: Grossly nonfocal  Data Reviewed: Basic Metabolic Panel:  Recent Labs Lab 09/07/13 1815 09/08/13 0603  NA 134* 135*  K 4.3 4.6  CL 94* 98  CO2 27 25  GLUCOSE 126* 257*  BUN 16 17  CREATININE 0.97 0.95  CALCIUM 9.5 8.3*   Liver Function Tests:  Recent Labs Lab 09/07/13 1815  AST 33  ALT 27  ALKPHOS 103  BILITOT 0.2*  PROT 7.6  ALBUMIN 4.0   CBC:  Recent Labs Lab 09/07/13 1815 09/08/13 0603  WBC 8.4 6.5  NEUTROABS 6.9  --   HGB 11.6* 10.1*  HCT 36.0 31.4*  MCV 84.5 84.2  PLT 223 190   CBG:  Recent Labs Lab 09/08/13 0105 09/08/13 0752 09/08/13 1143 09/08/13 1634 09/08/13 2216  GLUCAP 299* 261* 257* 291* 297*    Scheduled Meds: . sodium chloride   Intravenous Once  . amitriptyline  50 mg Oral QHS  . amLODipine  10 mg Oral Daily  . atorvastatin  40 mg Oral Daily  .  diclofenac  75 mg Oral BID  . enoxaparin (LOVENOX) injection  40 mg Subcutaneous Q24H  . gabapentin  300 mg Oral TID  . insulin aspart  0-5 Units Subcutaneous QHS  . insulin aspart  0-9 Units Subcutaneous TID WC  . insulin glargine  40 Units Subcutaneous QHS  . losartan  100 mg Oral Daily  . metoprolol  75 mg Oral BID  . oseltamivir  75 mg Oral Q24H  . pantoprazole  40 mg Oral BID  . predniSONE  50 mg Oral Q breakfast  . triamterene-hydrochlorothiazide  2 tablet Oral Daily   Continuous Infusions: . sodium chloride 1,000 mL (09/08/13 0904)     Faye Ramsay, MD  [facility] Pager [phone]  If 7PM-7AM, please contact  night-coverage [email or web address] Password [email or web address] 09/08/2013, 10:43 PM   LOS: 1 day

## 2013-09-08 NOTE — Progress Notes (Signed)
UR completed 

## 2013-09-08 NOTE — Progress Notes (Signed)
Spoke with Dr. Doyle Askew about [name] not eating her lunch and she ordered her to still receive the insulin per sliding scale. CBG-257. Also informed her of pt starting to have diarrhea. Stool sample sent to lab.

## 2013-09-09 DIAGNOSIS — J189 Pneumonia, unspecified organism: Secondary | ICD-10-CM

## 2013-09-09 LAB — BASIC METABOLIC PANEL
BUN: 27 mg/dL — AB (ref 6–23)
CALCIUM: 8.6 mg/dL (ref 8.4–10.5)
CO2: 25 meq/L (ref 19–32)
CREATININE: 1.1 mg/dL (ref 0.50–1.10)
Chloride: 101 mEq/L (ref 96–112)
GFR calc Af Amer: 57 mL/min — ABNORMAL LOW (ref >90)
GFR, EST NON AFRICAN AMERICAN: 49 mL/min — AB (ref >90)
GLUCOSE: 207 mg/dL — AB (ref 70–99)
Potassium: 4.6 mEq/L (ref 3.7–5.3)
Sodium: 137 mEq/L (ref 137–147)

## 2013-09-09 LAB — CBC
HCT: 29.3 % — ABNORMAL LOW (ref 36.0–46.0)
HEMOGLOBIN: 9.4 g/dL — AB (ref 12.0–15.0)
MCH: 27 pg (ref 26.0–34.0)
MCHC: 32.1 g/dL (ref 30.0–36.0)
MCV: 84.2 fL (ref 78.0–100.0)
Platelets: 219 10*3/uL (ref 150–400)
RBC: 3.48 MIL/uL — AB (ref 3.87–5.11)
RDW: 14.6 % (ref 11.5–15.5)
WBC: 7.1 10*3/uL (ref 4.0–10.5)

## 2013-09-09 LAB — GLUCOSE, CAPILLARY
GLUCOSE-CAPILLARY: 296 mg/dL — AB (ref 70–99)
Glucose-Capillary: 134 mg/dL — ABNORMAL HIGH (ref 70–99)
Glucose-Capillary: 211 mg/dL — ABNORMAL HIGH (ref 70–99)
Glucose-Capillary: 346 mg/dL — ABNORMAL HIGH (ref 70–99)

## 2013-09-09 MED ORDER — PREDNISONE 20 MG PO TABS
40.0000 mg | ORAL_TABLET | Freq: Every day | ORAL | Status: DC
  Administered 2013-09-10: 40 mg via ORAL
  Filled 2013-09-09 (×2): qty 2

## 2013-09-09 NOTE — Progress Notes (Signed)
Patient ID: [name], female   DOB: 01-09-1941, 73 y.o.   MRN: [id number]  [facility] PROGRESS NOTE  [name] BDZ:[id number] DOB: 30-Jun-1941 DOA: 09/07/2013 PCP: [name], MD  Brief narrative:  73 y.o. female with past medical history hypertension, diabetes mellitus and asthma who presented to [facility] ED with several days duration of generalized body aches, subjective fevers, chills, cough productive of yellow sputum, non bloody but watery diarrhea. [facility] asked to admit for further evaluation.   Active Problems:  Flu-like symptoms  - Influenza A positive  - pt is clinically improving but still coughing and with poor oral intake - will continue Tamiflu and supportive care with nebulizers, oxygen as needed  - possible d/c in AM Asthma, chronic  - maintaining oxygen saturation at target range  - will continue Prednisone, BD's  HTN (hypertension)  - reasonable inpatient control  - continue home regimen with Norvasc, Metoprolol, Losartan, Triamterene - HCTZ  Depression  - appears to be stable and at baseline  DM type 2 with diabetic peripheral neuropathy  - reasonable inpatient control  - continue Lantus as per home medical regimen  - continue SSI while inpatient  Anemia of chronic disease - drop in Hg over 24 hours - likely dilutional, no signs of active bleed - CBC in AM  Consultants:  None Procedures/Studies:  Dg Chest 2 View 09/07/2013 No active cardiopulmonary disease.  Antibiotics:  Given one dose of Zithromax and Rocephin in ED on arrival   Code Status: DNR  Family Communication: Pt and husband at bedside  Disposition Plan: Home when medically stable  HPI/Subjective: No events overnight.   Objective: Filed Vitals:   09/08/13 1109 09/08/13 1404 09/08/13 2220 09/09/13 1024  BP: 138/68 120/68 123/67 134/78  Pulse: 80 73 77 72  Temp: 98.7 F (37.1 C) 98.7 F (37.1 C) 98.7 F (37.1 C)   TempSrc: Oral Oral Oral   Resp: 20 18 18    Height:       Weight:      SpO2: 95% 93% 95%     Intake/Output Summary (Last 24 hours) at 09/09/13 1348 Last data filed at 09/08/13 1800  Gross per 24 hour  Intake    590 ml  Output      0 ml  Net    590 ml    Exam:   General:  Pt is alert, follows commands appropriately, not in acute distress  Cardiovascular: Regular rate and rhythm, S1/S2, no murmurs, no rubs, no gallops  Respiratory: Clear to auscultation bilaterally, no wheezing, no crackles, no rhonchi  Abdomen: Soft, non tender, non distended, bowel sounds present, no guarding  Extremities: No edema, pulses DP and PT palpable bilaterally  Neuro: Grossly nonfocal  Data Reviewed: Basic Metabolic Panel:  Recent Labs Lab 09/07/13 1815 09/08/13 0603 09/09/13 0606  NA 134* 135* 137  K 4.3 4.6 4.6  CL 94* 98 101  CO2 27 25 25   GLUCOSE 126* 257* 207*  BUN 16 17 27*  CREATININE 0.97 0.95 1.10  CALCIUM 9.5 8.3* 8.6   Liver Function Tests:  Recent Labs Lab 09/07/13 1815  AST 33  ALT 27  ALKPHOS 103  BILITOT 0.2*  PROT 7.6  ALBUMIN 4.0   CBC:  Recent Labs Lab 09/07/13 1815 09/08/13 0603 09/09/13 0606  WBC 8.4 6.5 7.1  NEUTROABS 6.9  --   --   HGB 11.6* 10.1* 9.4*  HCT 36.0 31.4* 29.3*  MCV 84.5 84.2 84.2  PLT 223 190 219  CBG:  Recent Labs Lab 09/08/13 1143 09/08/13 1634 09/08/13 2216 09/09/13 0749 09/09/13 1131  GLUCAP 257* 291* 297* 134* 211*    Recent Results (from the past 240 hour(s))  CULTURE, BLOOD (ROUTINE X 2)     Status: None   Collection Time    09/07/13 10:30 PM      Result Value Range Status   Specimen Description BLOOD RIGHT WRIST   Final   Special Requests BOTTLES DRAWN AEROBIC AND ANAEROBIC 5CC   Final   Culture  Setup Time     Final   Value: 09/08/2013 09:30     Performed at [facility]   Culture     Final   Value:        BLOOD CULTURE RECEIVED NO GROWTH TO DATE CULTURE WILL BE HELD FOR 5 DAYS BEFORE ISSUING A FINAL NEGATIVE REPORT     Performed at [facility]   Report Status PENDING   Incomplete  CULTURE, BLOOD (ROUTINE X 2)     Status: None   Collection Time    09/07/13 10:45 PM      Result Value Range Status   Specimen Description BLOOD LEFT ANTECUBITAL   Final   Special Requests BOTTLES DRAWN AEROBIC AND ANAEROBIC 4CC   Final   Culture  Setup Time     Final   Value: 09/08/2013 09:29     Performed at [facility]   Culture     Final   Value:        BLOOD CULTURE RECEIVED NO GROWTH TO DATE CULTURE WILL BE HELD FOR 5 DAYS BEFORE ISSUING A FINAL NEGATIVE REPORT     Performed at [facility]   Report Status PENDING   Incomplete     Scheduled Meds: . amitriptyline  50 mg Oral QHS  . amLODipine  10 mg Oral Daily  . atorvastatin  40 mg Oral Daily  . diclofenac  75 mg Oral BID  . enoxaparin (LOVENOX) injection  40 mg Subcutaneous Q24H  . gabapentin  300 mg Oral TID  . insulin aspart  0-5 Units Subcutaneous QHS  . insulin aspart  0-9 Units Subcutaneous TID WC  . insulin glargine  40 Units Subcutaneous QHS  . losartan  100 mg Oral Daily  . metoprolol  75 mg Oral BID  . oseltamivir  75 mg Oral Q24H  . pantoprazole  40 mg Oral BID  . predniSONE  50 mg Oral Q breakfast  . triamterene-hydrochlorothiazide  2 tablet Oral Daily   Continuous Infusions:   Faye Ramsay, MD  [facility] Pager [phone]  If 7PM-7AM, please contact night-coverage [email or web address] Password [email or web address] 09/09/2013, 1:48 PM   LOS: 2 days

## 2013-09-09 NOTE — Progress Notes (Signed)
Inpatient Diabetes Program Recommendations  AACE/ADA: New Consensus Statement on Inpatient Glycemic Control (2013)  Target Ranges:  Prepandial:   less than 140 mg/dL      Peak postprandial:   less than 180 mg/dL (1-2 hours)      Critically ill patients:  140 - 180 mg/dL   Reason for Visit: Hyperglycemia   Results for [name], [name] (MRN [id number]) as of 09/09/2013 14:05  Ref. Range 09/08/2013 01:05 09/08/2013 07:52 09/08/2013 11:43 09/08/2013 16:34 09/08/2013 22:16 09/09/2013 07:49 09/09/2013 11:31  Glucose-Capillary Latest Range: 70-99 mg/dL 299 (H) 261 (H) 257 (H) 291 (H) 297 (H) 134 (H) 211 (H)    Needs meal coverage insulin.  Recommendations: Please consider addition of Novolog 10 units tidwc for meal coverage insulin if pt eats >50% meal.  Will continue to follow. Thank you. Lorenda Peck, RD, LDN, CDE Inpatient Diabetes Coordinator [phone]

## 2013-09-09 NOTE — Progress Notes (Signed)
PT Cancellation Note  Patient Details Name: ELLERY TASH MRN: [id number] DOB: 05/23/1941   Cancelled Treatment:    Reason Eval/Treat Not Completed: Other-Attempted PT evaluation-pt declined to participate-c/o nausea. Will check back on tomorrow.    Weston Anna, MPT Pager: [phone]

## 2013-09-10 ENCOUNTER — Observation Stay (HOSPITAL_COMMUNITY): Payer: Medicare Other

## 2013-09-10 LAB — GLUCOSE, CAPILLARY
Glucose-Capillary: 137 mg/dL — ABNORMAL HIGH (ref 70–99)
Glucose-Capillary: 233 mg/dL — ABNORMAL HIGH (ref 70–99)

## 2013-09-10 LAB — CBC
HEMATOCRIT: 31.6 % — AB (ref 36.0–46.0)
HEMOGLOBIN: 10.3 g/dL — AB (ref 12.0–15.0)
MCH: 27 pg (ref 26.0–34.0)
MCHC: 32.6 g/dL (ref 30.0–36.0)
MCV: 82.7 fL (ref 78.0–100.0)
Platelets: 246 10*3/uL (ref 150–400)
RBC: 3.82 MIL/uL — AB (ref 3.87–5.11)
RDW: 14.2 % (ref 11.5–15.5)
WBC: 9.6 10*3/uL (ref 4.0–10.5)

## 2013-09-10 LAB — BASIC METABOLIC PANEL
BUN: 28 mg/dL — AB (ref 6–23)
CALCIUM: 8.8 mg/dL (ref 8.4–10.5)
CO2: 24 meq/L (ref 19–32)
Chloride: 100 mEq/L (ref 96–112)
Creatinine, Ser: 1.14 mg/dL — ABNORMAL HIGH (ref 0.50–1.10)
GFR calc Af Amer: 54 mL/min — ABNORMAL LOW (ref >90)
GFR, EST NON AFRICAN AMERICAN: 47 mL/min — AB (ref >90)
Glucose, Bld: 160 mg/dL — ABNORMAL HIGH (ref 70–99)
Potassium: 4.3 mEq/L (ref 3.7–5.3)
Sodium: 138 mEq/L (ref 137–147)

## 2013-09-10 MED ORDER — AZITHROMYCIN 500 MG PO TABS
500.0000 mg | ORAL_TABLET | Freq: Every day | ORAL | Status: DC
  Administered 2013-09-10: 500 mg via ORAL
  Filled 2013-09-10: qty 1

## 2013-09-10 MED ORDER — GUAIFENESIN-DM 100-10 MG/5ML PO SYRP: 5.0000 mL | ORAL_SOLUTION | ORAL | Status: DC | PRN

## 2013-09-10 MED ORDER — OSELTAMIVIR PHOSPHATE 75 MG PO CAPS: 75.0000 mg | ORAL_CAPSULE | ORAL | Status: DC

## 2013-09-10 MED ORDER — AZITHROMYCIN 250 MG PO TABS: 500.0000 mg | ORAL_TABLET | Freq: Every day | ORAL | Status: DC

## 2013-09-10 MED ORDER — ALBUTEROL SULFATE (2.5 MG/3ML) 0.083% IN NEBU: 2.5000 mg | INHALATION_SOLUTION | Freq: Four times a day (QID) | RESPIRATORY_TRACT | Status: DC | PRN

## 2013-09-10 MED ORDER — PREDNISONE 20 MG PO TABS
30.0000 mg | ORAL_TABLET | Freq: Every day | ORAL | Status: DC
  Filled 2013-09-10: qty 1

## 2013-09-10 NOTE — Progress Notes (Signed)
Patient pending discharge awaiting X-ray, notified Dr about pending discharge and X-ray, awaiting finial read per physician, will continue to monitor and discharge patient according to policy, patient in stable condition at this time

## 2013-09-10 NOTE — Evaluation (Signed)
Physical Therapy Evaluation Patient Details Name: [name] MRN: [id number] DOB: 1941/04/24 Today's Date: 09/10/2013 Time: 9629-5284 PT Time Calculation (min): 16 min  PT Assessment / Plan / Recommendation History of Present Illness  73 y.o. female with past medical history hypertension, diabetes mellitus and asthma who presented to [facility] ED with several days duration of generalized body aches, subjective fevers, chills, cough productive of yellow sputum, non bloody but watery diarrhea.   Clinical Impression  Patient evaluated by Physical Therapy with no further acute PT needs identified. All education has been completed and the patient has no further questions.  No follow-up Physial Therapy or equipment needs identified.  Pt reports spouse will be home upon d/c. PT is signing off. Thank you for this referral.     PT Assessment  Patent does not need any further PT services    Follow Up Recommendations  No PT follow up    Does the patient have the potential to tolerate intense rehabilitation      Barriers to Discharge        Equipment Recommendations  None recommended by PT    Recommendations for Other Services     Frequency      Precautions / Restrictions Precautions Precautions: None   Pertinent Vitals/Pain SpO2 at rest 96% room air SpO2 room air during ambulation 93%      Mobility  Bed Mobility Overal bed mobility: Modified Independent Transfers Overall transfer level: Modified independent Ambulation/Gait Ambulation/Gait assistance: Supervision Ambulation Distance (Feet): 120 Feet Assistive device: None Gait Pattern/deviations: Step-through pattern Gait velocity: decr General Gait Details: pt a little unsteady initially however no overt LOB and improved with distance, SpO2 93% room air    Exercises     PT Diagnosis:    PT Problem List:   PT Treatment Interventions:       PT Goals(Current goals can be found in the care plan section) Acute Rehab PT  Goals PT Goal Formulation: No goals set, d/c therapy  Visit Information  Last PT Received On: 09/10/13 Assistance Needed: +1 History of Present Illness: 73 y.o. female with past medical history hypertension, diabetes mellitus and asthma who presented to [facility] ED with several days duration of generalized body aches, subjective fevers, chills, cough productive of yellow sputum, non bloody but watery diarrhea.        Prior [place] expects to be discharged to:: Private residence Living Arrangements: Spouse/significant other Type of Home: House Home Access: Stairs to enter Technical brewer of Steps: "a couple" Home Layout: One level Home Equipment: None Prior Function Level of Independence: Independent Communication Communication: No difficulties    Cognition  Cognition Arousal/Alertness: Awake/alert Behavior During Therapy: WFL for tasks assessed/performed Overall Cognitive Status: Within Functional Limits for tasks assessed    Extremity/Trunk Assessment Upper Extremity Assessment Upper Extremity Assessment: Overall WFL for tasks assessed Lower Extremity Assessment Lower Extremity Assessment: Overall WFL for tasks assessed   Balance    End of Session PT - End of Session Activity Tolerance: Patient tolerated treatment well Patient left: in bed;with call bell/phone within reach  GP Functional Assessment Tool Used: clinical judgement Functional Limitation: Mobility: Walking and moving around Mobility: Walking and Moving Around Current Status ([id number]): At least 1 percent but less than 20 percent impaired, limited or restricted Mobility: Walking and Moving Around Goal Status [phone]): At least 1 percent but less than 20 percent impaired, limited or restricted Mobility: Walking and Moving Around Discharge Status [phone]): At least 1 percent but less  than 20 percent impaired, limited or restricted   Martine Bleecker,KATHrine E 09/10/2013, 10:05 AM Carmelia Bake,  PT, DPT 09/10/2013 Pager: [phone]

## 2013-09-10 NOTE — Discharge Instructions (Signed)

## 2013-09-10 NOTE — Progress Notes (Signed)
Patient discharge home with husband, alert and oriented, discharge instructions given, patient and husband verbalize understanding of discharge instructions given, My Chart access declined at this time, patient in stable condition at this time

## 2013-09-10 NOTE — Discharge Summary (Signed)
Physician Discharge Summary  [name] ZMO:[id number] DOB: 1941/08/27 DOA: 09/07/2013  PCP: [name], MD  Admit date: 09/07/2013 Discharge date: 09/10/2013  Recommendations for Outpatient Follow-up:  1. Pt will need to follow up with PCP in 2-3 weeks post discharge 2. Please obtain BMP to evaluate electrolytes and kidney function 3. Please also check CBC to evaluate Hg and Hct levels 4. Please note that pt was discharged on Tamiflu and Zithromax   Discharge Diagnoses: Influenza A positive, PNA  Active Problems:   Flu-like symptoms   Asthma, chronic   Hypoxia   HTN (hypertension)   Depression   DM type 2 with diabetic peripheral neuropathy    Discharge Condition: Stable  Diet recommendation: Heart healthy diet discussed in details   Brief narrative:  73 y.o. female with past medical history hypertension, diabetes mellitus and asthma who presented to [facility] ED with several days duration of generalized body aches, subjective fevers, chills, cough productive of yellow sputum, non bloody but watery diarrhea. [facility] asked to admit for further evaluation.  Active Problems:  Flu-like symptoms  - Influenza A positive  - pt is clinically improving, tolerating solids and liquids  - will continue Tamiflu and supportive care with nebulizers, will provide prescription for albuterol nebulizer upon discharge  - place on Zithromax as well upon discharge  Asthma, chronic  - maintaining oxygen saturation at target range  - pt does not want to take Prednisone, she is not wheezing on exam this AM - continue nebulizer as needed upon discharge  HTN (hypertension)  - reasonable inpatient control  - continue home regimen with Norvasc, Metoprolol, Losartan, Triamterene - HCTZ  Depression  - appears to be stable and at baseline  DM type 2 with diabetic peripheral neuropathy  - reasonable inpatient control  - continue Lantus as per home medical regimen  - continue SSI while inpatient   Anemia of chronic disease  - no signs of active bleed  - Hg/Hct stable   Consultants:  None Procedures/Studies:  Dg Chest 2 View 09/07/2013 No active cardiopulmonary disease.  Antibiotics:  Given one dose of Zithromax and Rocephin in ED on arrival   Code Status: DNR  Family Communication: Pt and husband at bedside  Disposition Plan: Home today   HPI/Subjective:  No events overnight.    Discharge Exam: Filed Vitals:   09/10/13 0547  BP: 122/71  Pulse: 67  Temp: 98.1 F (36.7 C)  Resp: 20   Filed Vitals:   09/09/13 1024 09/09/13 1453 09/09/13 2056 09/10/13 0547  BP: 134/78 137/73 151/80 122/71  Pulse: 72 68 98 67  Temp:  98.7 F (37.1 C) 98.5 F (36.9 C) 98.1 F (36.7 C)  TempSrc:  Oral Oral Oral  Resp:  19 16 20   Height:      Weight:      SpO2:  95% 99% 97%    General: Pt is alert, follows commands appropriately, not in acute distress Cardiovascular: Regular rate and rhythm, S1/S2 +, no murmurs, no rubs, no gallops Respiratory: Clear to auscultation bilaterally, no wheezing, no crackles, no rhonchi Abdominal: Soft, non tender, non distended, bowel sounds +, no guarding Extremities: no edema, no cyanosis, pulses palpable bilaterally DP and PT Neuro: Grossly nonfocal  Discharge Instructions  Discharge Orders   Future Orders Complete By Expires   Diet - low sodium heart healthy  As directed    Increase activity slowly  As directed        Medication List  amitriptyline 50 MG tablet  Commonly known as:  ELAVIL  Take 50 mg by mouth at bedtime.     amLODipine 10 MG tablet  Commonly known as:  NORVASC  Take 10 mg by mouth daily.     atorvastatin 40 MG tablet  Commonly known as:  LIPITOR  Take 40 mg by mouth daily.     azithromycin 250 MG tablet  Commonly known as:  ZITHROMAX  Take 2 tablets (500 mg total) by mouth daily.     budesonide-formoterol 160-4.5 MCG/ACT inhaler  Commonly known as:  SYMBICORT  Inhale 2 puffs into the lungs as  needed.     diclofenac 75 MG EC tablet  Commonly known as:  VOLTAREN  Take 75 mg by mouth 2 (two) times daily.     gabapentin 300 MG capsule  Commonly known as:  NEURONTIN  Take 300 mg by mouth 3 (three) times daily.     guaiFENesin-dextromethorphan 100-10 MG/5ML syrup  Commonly known as:  ROBITUSSIN DM  Take 5 mLs by mouth every 4 (four) hours as needed for cough.     HUMALOG 100 UNIT/ML cartridge  Generic drug:  insulin lispro  Inject 14 Units into the skin daily after breakfast.     HUMALOG 100 UNIT/ML cartridge  Generic drug:  insulin lispro  Inject 20 Units into the skin daily before lunch.     HUMALOG 100 UNIT/ML cartridge  Generic drug:  insulin lispro  Inject 20 Units into the skin daily before supper.     LANTUS SOLOSTAR [place]  Inject 56 Units into the skin at bedtime.     losartan 100 MG tablet  Commonly known as:  COZAAR  Take 100 mg by mouth daily.     methocarbamol 500 MG tablet  Commonly known as:  ROBAXIN  Take 1-2 tablets (500-1,000 mg total) by mouth 4 (four) times daily as needed for muscle spasms.     METOPROLOL TARTRATE PO  Take 75 mg by mouth 2 (two) times daily.     omeprazole 20 MG capsule  Commonly known as:  PRILOSEC  Take 20 mg by mouth 2 (two) times daily.     oseltamivir 75 MG capsule  Commonly known as:  TAMIFLU  Take 1 capsule (75 mg total) by mouth daily.     PRESERVISION AREDS 2 PO  Take by mouth 2 (two) times daily.     PROAIR HFA IN  Inhale into the lungs as needed.     albuterol (2.5 MG/3ML) 0.083% nebulizer solution  Commonly known as:  PROVENTIL  Take 3 mLs (2.5 mg total) by nebulization every 6 (six) hours as needed for wheezing or shortness of breath.     traMADol 50 MG tablet  Commonly known as:  ULTRAM  Take 1 tablet (50 mg total) by mouth every 8 (eight) hours as needed for moderate pain.     triamterene-hydrochlorothiazide 37.5-25 MG per capsule  Commonly known as:  DYAZIDE  Take 2 capsules by mouth every morning.            Follow-up Information   Schedule an appointment as soon as possible for a visit with [name], MD.   Specialty:  Family Medicine   Contact information:   [place] [address] [phone]       Follow up with Faye Ramsay, MD. (call my cell phone [phone])    Specialty:  Internal Medicine   Contact information:   [address] [place] [address]  [phone]        The results of significant diagnostics from this hospitalization (including imaging, microbiology, ancillary and laboratory) are listed below for reference.     Microbiology: Recent Results (from the past 240 hour(s))  CULTURE, BLOOD (ROUTINE X 2)     Status: None   Collection Time    09/07/13 10:30 PM      Result Value Range Status   Specimen Description BLOOD RIGHT WRIST   Final   Special Requests BOTTLES DRAWN AEROBIC AND ANAEROBIC 5CC   Final   Culture  Setup Time     Final   Value: 09/08/2013 09:30     Performed at [facility]   Culture     Final   Value:        BLOOD CULTURE RECEIVED NO GROWTH TO DATE CULTURE WILL BE HELD FOR 5 DAYS BEFORE ISSUING A FINAL NEGATIVE REPORT     Performed at [facility]   Report Status PENDING   Incomplete  CULTURE, BLOOD (ROUTINE X 2)     Status: None   Collection Time    09/07/13 10:45 PM      Result Value Range Status   Specimen Description BLOOD LEFT ANTECUBITAL   Final   Special Requests BOTTLES DRAWN AEROBIC AND ANAEROBIC 4CC   Final   Culture  Setup Time     Final   Value: 09/08/2013 09:29     Performed at [facility]   Culture     Final   Value:        BLOOD CULTURE RECEIVED NO GROWTH TO DATE CULTURE WILL BE HELD FOR 5 DAYS BEFORE ISSUING A FINAL NEGATIVE REPORT     Performed at [facility]   Report Status PENDING   Incomplete     Labs: Basic Metabolic Panel:  Recent Labs Lab 09/07/13 1815 09/08/13 0603 09/09/13 0606 09/10/13 0610  NA 134* 135* 137  138  K 4.3 4.6 4.6 4.3  CL 94* 98 101 100  CO2 27 25 25 24   GLUCOSE 126* 257* 207* 160*  BUN 16 17 27* 28*  CREATININE 0.97 0.95 1.10 1.14*  CALCIUM 9.5 8.3* 8.6 8.8   Liver Function Tests:  Recent Labs Lab 09/07/13 1815  AST 33  ALT 27  ALKPHOS 103  BILITOT 0.2*  PROT 7.6  ALBUMIN 4.0   CBC:  Recent Labs Lab 09/07/13 1815 09/08/13 0603 09/09/13 0606 09/10/13 0610  WBC 8.4 6.5 7.1 9.6  NEUTROABS 6.9  --   --   --   HGB 11.6* 10.1* 9.4* 10.3*  HCT 36.0 31.4* 29.3* 31.6*  MCV 84.5 84.2 84.2 82.7  PLT 223 190 219 246   CBG:  Recent Labs Lab 09/09/13 0749 09/09/13 1131 09/09/13 1821 09/09/13 2138 09/10/13 0734  GLUCAP 134* 211* 346* 296* 137*   SIGNED: Time coordinating discharge: Over 30 minutes  Faye Ramsay, MD  [facility] 09/10/2013, 11:13 AM Pager [phone]  If 7PM-7AM, please contact night-coverage [email or web address] Password [email or web address]

## 2013-09-14 LAB — CULTURE, BLOOD (ROUTINE X 2)
CULTURE: NO GROWTH
CULTURE: NO GROWTH

## 2014-01-05 ENCOUNTER — Other Ambulatory Visit: Payer: Self-pay

## 2014-01-05 DIAGNOSIS — Z1231 Encounter for screening mammogram for malignant neoplasm of breast: Secondary | ICD-10-CM

## 2014-01-07 ENCOUNTER — Encounter (INDEPENDENT_AMBULATORY_CARE_PROVIDER_SITE_OTHER): Payer: Self-pay

## 2014-01-07 ENCOUNTER — Ambulatory Visit
Admission: RE | Admit: 2014-01-07 | Discharge: 2014-01-07 | Disposition: A | Discharge status: Home / Self Care | Payer: Medicare Other | Source: Ambulatory Visit

## 2014-01-07 DIAGNOSIS — Z1231 Encounter for screening mammogram for malignant neoplasm of breast: Secondary | ICD-10-CM

## 2014-12-01 ENCOUNTER — Emergency Department (HOSPITAL_COMMUNITY)
Admission: EM | Admit: 2014-12-01 | Discharge: 2014-12-01 | Disposition: A | Discharge status: Home / Self Care | Payer: Medicare Other | Attending: Emergency Medicine | Admitting: Emergency Medicine

## 2014-12-01 ENCOUNTER — Encounter (HOSPITAL_COMMUNITY): Payer: Self-pay

## 2014-12-01 ENCOUNTER — Emergency Department (HOSPITAL_COMMUNITY): Payer: Medicare Other

## 2014-12-01 DIAGNOSIS — R079 Chest pain, unspecified: Secondary | ICD-10-CM

## 2014-12-01 DIAGNOSIS — I10 Essential (primary) hypertension: Secondary | ICD-10-CM | POA: Insufficient documentation

## 2014-12-01 DIAGNOSIS — Z794 Long term (current) use of insulin: Secondary | ICD-10-CM | POA: Insufficient documentation

## 2014-12-01 DIAGNOSIS — R0789 Other chest pain: Secondary | ICD-10-CM | POA: Insufficient documentation

## 2014-12-01 DIAGNOSIS — Z792 Long term (current) use of antibiotics: Secondary | ICD-10-CM | POA: Insufficient documentation

## 2014-12-01 DIAGNOSIS — E119 Type 2 diabetes mellitus without complications: Secondary | ICD-10-CM | POA: Diagnosis not present

## 2014-12-01 DIAGNOSIS — J45909 Unspecified asthma, uncomplicated: Secondary | ICD-10-CM | POA: Insufficient documentation

## 2014-12-01 DIAGNOSIS — Z79899 Other long term (current) drug therapy: Secondary | ICD-10-CM | POA: Diagnosis not present

## 2014-12-01 DIAGNOSIS — M2550 Pain in unspecified joint: Secondary | ICD-10-CM | POA: Diagnosis not present

## 2014-12-01 DIAGNOSIS — M25512 Pain in left shoulder: Secondary | ICD-10-CM | POA: Insufficient documentation

## 2014-12-01 LAB — BASIC METABOLIC PANEL
Anion gap: 10 (ref 5–15)
BUN: 24 mg/dL — AB (ref 6–23)
CHLORIDE: 96 mmol/L (ref 96–112)
CO2: 28 mmol/L (ref 19–32)
Calcium: 9.2 mg/dL (ref 8.4–10.5)
Creatinine, Ser: 0.93 mg/dL (ref 0.50–1.10)
GFR calc Af Amer: 69 mL/min — ABNORMAL LOW (ref >90)
GFR calc non Af Amer: 60 mL/min — ABNORMAL LOW (ref >90)
Glucose, Bld: 195 mg/dL — ABNORMAL HIGH (ref 70–99)
POTASSIUM: 4.2 mmol/L (ref 3.5–5.1)
Sodium: 134 mmol/L — ABNORMAL LOW (ref 135–145)

## 2014-12-01 LAB — I-STAT TROPONIN, ED
Troponin i, poc: 0 ng/mL (ref 0.00–0.08)
Troponin i, poc: 0.01 ng/mL (ref 0.00–0.08)

## 2014-12-01 LAB — CBC
HCT: 37.8 % (ref 36.0–46.0)
Hemoglobin: 11.7 g/dL — ABNORMAL LOW (ref 12.0–15.0)
MCH: 26.2 pg (ref 26.0–34.0)
MCHC: 31 g/dL (ref 30.0–36.0)
MCV: 84.8 fL (ref 78.0–100.0)
PLATELETS: 271 10*3/uL (ref 150–400)
RBC: 4.46 MIL/uL (ref 3.87–5.11)
RDW: 15.8 % — ABNORMAL HIGH (ref 11.5–15.5)
WBC: 12.5 10*3/uL — ABNORMAL HIGH (ref 4.0–10.5)

## 2014-12-01 LAB — CBG MONITORING, ED: GLUCOSE-CAPILLARY: 151 mg/dL — AB (ref 70–99)

## 2014-12-01 MED ORDER — FENTANYL CITRATE 0.05 MG/ML IJ SOLN
50.0000 ug | INTRAMUSCULAR | Status: DC | PRN
  Administered 2014-12-01: 50 ug via INTRAVENOUS
  Filled 2014-12-01: qty 2

## 2014-12-01 MED ORDER — ONDANSETRON HCL 4 MG/2ML IJ SOLN
4.0000 mg | Freq: Once | INTRAMUSCULAR | Status: AC
  Administered 2014-12-01: 4 mg via INTRAVENOUS
  Filled 2014-12-01: qty 2

## 2014-12-01 NOTE — ED Provider Notes (Signed)
CSN: [id number]     Arrival date & time 12/01/14  1019 History   First MD Initiated Contact with Patient 12/01/14 1045     Chief Complaint  Patient presents with  . Chest Pain  . Shoulder Pain     (Consider location/radiation/quality/duration/timing/severity/associated sxs/prior Treatment) HPI Comments: 74 year old female with history of asthma, high blood pressure, depression, diabetes, cholesterol, obesity presents with left shoulder and chest pressure. Patient has history of rheumatoid arthritis and received injections by rheumatology this morning however patient was having chest pain and additional left shoulder pain CSN to the ER for further evaluation. Patient normally has intermittent joint pains in her shoulder pain is worse with movement and position. No fevers chills or swelling. Patient also developed chest pressure since last night along the chest shoulder pain. No focal radiation of the chest discomfort, patient has had intermittent chest pressure in the past. No diaphoresis or exertional symptoms no blood clot history recent surgery or unilateral leg swelling. No recent stress test.  Patient is a 74 y.o. female presenting with chest pain and shoulder pain. The history is provided by the patient.  Chest Pain Associated symptoms: no abdominal pain, no back pain, no fever, no headache, no shortness of breath and not vomiting   Shoulder Pain Associated symptoms: no back pain, no fever and no neck pain     Past Medical History  Diagnosis Date  . Diabetes mellitus   . Hypertension   . Asthma   . S/P appy    Past Surgical History  Procedure Laterality Date  . Appendectomy  1952  . Vaginal hysterectomy  1977  . Knee arthroscopy  2003 2005    right knee  . Mass fallopian tube    . Incontinence surgery     Family History  Problem Relation Age of Onset  . Colon cancer Neg Hx    History  Substance Use Topics  . Smoking status: Never Smoker   . Smokeless tobacco: Never Used   . Alcohol Use: No   OB History    No data available     Review of Systems  Constitutional: Negative for fever and chills.  HENT: Negative for congestion.   Eyes: Negative for visual disturbance.  Respiratory: Negative for shortness of breath.   Cardiovascular: Positive for chest pain.  Gastrointestinal: Negative for vomiting and abdominal pain.  Genitourinary: Negative for dysuria and flank pain.  Musculoskeletal: Positive for arthralgias. Negative for back pain, joint swelling, neck pain and neck stiffness.  Skin: Negative for rash.  Neurological: Negative for light-headedness and headaches.      Allergies  Vasotec; Codeine; Lansoprazole; Morphine and related; and Sulfate  Home Medications   Prior to Admission medications   Medication Sig Start Date End Date Taking? Authorizing Provider  Albuterol Sulfate (PROAIR HFA IN) Inhale 2 puffs into the lungs as needed (wheezing, shortness of breath).    Yes Historical Provider, MD  amitriptyline (ELAVIL) 50 MG tablet Take 50 mg by mouth at bedtime.   Yes Historical Provider, MD  amLODipine (NORVASC) 10 MG tablet Take 10 mg by mouth daily.   Yes Historical Provider, MD  atorvastatin (LIPITOR) 40 MG tablet Take 40 mg by mouth daily.   Yes Historical Provider, MD  gabapentin (NEURONTIN) 300 MG capsule Take 300 mg by mouth 4 (four) times daily.    Yes Historical Provider, MD  hydroxychloroquine (PLAQUENIL) 200 MG tablet Take 1 tablet by mouth daily. 09/01/14  Yes Historical Provider, MD  Insulin Glargine (LANTUS SOLOSTAR  [place]) Inject 56 Units into the skin at bedtime.   Yes Historical Provider, MD  insulin lispro (HUMALOG) 100 UNIT/ML SOCT Inject 22 Units into the skin 3 (three) times daily.    Yes Historical Provider, MD  losartan (COZAAR) 100 MG tablet Take 100 mg by mouth daily.   Yes Historical Provider, MD  metoprolol (LOPRESSOR) 50 MG tablet Take 75 mg by mouth 2 (two) times daily.   Yes Historical Provider, MD  Multiple  Vitamins-Minerals (PRESERVISION AREDS 2 PO) Take by mouth 2 (two) times daily.   Yes Historical Provider, MD  omeprazole (PRILOSEC) 20 MG capsule Take 20 mg by mouth 2 (two) times daily.   Yes Historical Provider, MD  traMADol (ULTRAM) 50 MG tablet Take 1 tablet (50 mg total) by mouth every 8 (eight) hours as needed for moderate pain. 09/04/13  Yes Shawnee Knapp, MD  triamterene-hydrochlorothiazide (DYAZIDE) 37.5-25 MG per capsule Take 2 capsules by mouth every morning.   Yes Historical Provider, MD  albuterol (PROVENTIL) (2.5 MG/3ML) 0.083% nebulizer solution Take 3 mLs (2.5 mg total) by nebulization every 6 (six) hours as needed for wheezing or shortness of breath. 09/10/13   Theodis Blaze, MD  azithromycin (ZITHROMAX) 250 MG tablet Take 2 tablets (500 mg total) by mouth daily. 09/10/13   Theodis Blaze, MD  guaiFENesin-dextromethorphan (ROBITUSSIN DM) 100-10 MG/5ML syrup Take 5 mLs by mouth every 4 (four) hours as needed for cough. 09/10/13   Theodis Blaze, MD  methocarbamol (ROBAXIN) 500 MG tablet Take 1-2 tablets (500-1,000 mg total) by mouth 4 (four) times daily as needed for muscle spasms. 09/04/13   Shawnee Knapp, MD  oseltamivir (TAMIFLU) 75 MG capsule Take 1 capsule (75 mg total) by mouth daily. 09/10/13   Theodis Blaze, MD   BP 135/65 mmHg  Pulse 77  Temp(Src) 97.9 F (36.6 C) (Oral)  Resp 21  SpO2 99% Physical Exam  Constitutional: She is oriented to person, place, and time. She appears well-developed and well-nourished.  HENT:  Head: Normocephalic and atraumatic.  Eyes: Conjunctivae are normal. Right eye exhibits no discharge. Left eye exhibits no discharge.  Neck: Normal range of motion. Neck supple. No tracheal deviation present.  Cardiovascular: Normal rate and regular rhythm.   Pulmonary/Chest: Effort normal and breath sounds normal.  Abdominal: Soft. She exhibits no distension. There is no tenderness. There is no guarding.  Musculoskeletal: She exhibits tenderness. She exhibits no edema.   Patient has reproducible tenderness and decreased range of motion of left shoulder no warmth or swelling, worse with flexion.  Neurological: She is alert and oriented to person, place, and time.  Skin: Skin is warm. No rash noted.  Psychiatric: She has a normal mood and affect.  Nursing note and vitals reviewed.   ED Course  Procedures (including critical care time) Labs Review Labs Reviewed  CBC - Abnormal; Notable for the following:    WBC 12.5 (*)    Hemoglobin 11.7 (*)    RDW 15.8 (*)    All other components within normal limits  BASIC METABOLIC PANEL - Abnormal; Notable for the following:    Sodium 134 (*)    Glucose, Bld 195 (*)    BUN 24 (*)    GFR calc non Af Amer 60 (*)    GFR calc Af Amer 69 (*)    All other components within normal limits  CBG MONITORING, ED - Abnormal; Notable for the following:    Glucose-Capillary 151 (*)    All  other components within normal limits  I-STAT TROPOININ, ED  I-STAT TROPOININ, ED    Imaging Review Dg Chest 2 View  12/01/2014   CLINICAL DATA:  Chest pain, left shoulder pain starting last night  EXAM: CHEST  2 VIEW  COMPARISON:  03/23/2014  FINDINGS: Cardiomediastinal silhouette is stable. No acute infiltrate or pleural effusion. No pulmonary edema. Bony thorax is stable. Again noted spinal stimulator wires mid thoracic spine.  IMPRESSION: No active cardiopulmonary disease.   Electronically Signed   By: Lahoma Crocker M.D.   On: 12/01/2014 11:55     EKG Interpretation   Date/Time:  Tuesday December 01 2014 10:33:20 EDT Ventricular Rate:  79 PR Interval:  182 QRS Duration: 78 QT Interval:  360 QTC Calculation: 412 R Axis:   45 Text Interpretation:  Normal sinus rhythm Normal ECG Confirmed by Svara Twyman   MD, Marcel Gary ([id number]) on 12/01/2014 10:59:58 AM      MDM   Final diagnoses:  Chest pain, unspecified chest pain type  Left shoulder pain   Patient presents with 2 concerns possibly similar. Left shoulder pain definitely musculoskeletal  reproducible, history of arthritis, recent joint injection. Difficult to tell if chest pain and pressure is related to radiation from the shoulder versus separate entity. Plan for screening troponin delta pain meds and reassessment. Discussed either observation the hospital versus close outpatient follow-up for stress test.  On recheck patient improved, no active chest pain, persistent left shoulder pain. Discussed observation the hospital versus close outpatient follow-up with cardiology and primary Dr. Patient prefers outpatient follow-up does not want to be observed in the hospital. His second troponin negative patient comfortable going home and reasons to return discussed.  Results and differential diagnosis were discussed with the patient/parent/guardian. Close follow up outpatient was discussed, comfortable with the plan.   Medications  fentaNYL (SUBLIMAZE) injection 50 mcg (50 mcg Intravenous Given 12/01/14 1150)  ondansetron (ZOFRAN) injection 4 mg (4 mg Intravenous Given 12/01/14 1217)    Filed Vitals:   12/01/14 1038 12/01/14 1159 12/01/14 1403  BP: 133/48 131/50 135/65  Pulse: 79 75 77  Temp: 97.9 F (36.6 C)    TempSrc: Oral    Resp: 16 16 21   SpO2: 98% 99% 99%    Final diagnoses:  Chest pain, unspecified chest pain type  Left shoulder pain      Elnora Morrison, MD 12/01/14 1433

## 2014-12-01 NOTE — ED Notes (Signed)
Chest pain with shoulder pain since last night.  Told to come here by MD.  Appt next with Dr. Wynonia Lawman cardiology next week.

## 2014-12-01 NOTE — Discharge Instructions (Signed)
If you were given medicines take as directed.  If you are on coumadin or contraceptives realize their levels and effectiveness is altered by many different medicines.  If you have any reaction (rash, tongues swelling, other) to the medicines stop taking and see a physician.   Please follow up as directed and return to the ER or see a physician for new or worsening symptoms.  Thank you. Filed Vitals:   12/01/14 1038 12/01/14 1159 12/01/14 1403  BP: 133/48 131/50 135/65  Pulse: 79 75 77  Temp: 97.9 F (36.6 C)    TempSrc: Oral    Resp: 16 16 21   SpO2: 98% 99% 99%

## 2015-01-07 ENCOUNTER — Other Ambulatory Visit: Payer: Self-pay | Admitting: Cardiology

## 2015-01-07 DIAGNOSIS — R0600 Dyspnea, unspecified: Secondary | ICD-10-CM

## 2015-01-08 ENCOUNTER — Ambulatory Visit (INDEPENDENT_AMBULATORY_CARE_PROVIDER_SITE_OTHER): Payer: Medicare Other | Admitting: Internal Medicine

## 2015-01-08 ENCOUNTER — Encounter (INDEPENDENT_AMBULATORY_CARE_PROVIDER_SITE_OTHER): Payer: Self-pay

## 2015-01-08 DIAGNOSIS — R0600 Dyspnea, unspecified: Secondary | ICD-10-CM | POA: Diagnosis not present

## 2015-01-08 LAB — PULMONARY FUNCTION TEST
DL/VA % PRED: 97 %
DL/VA: 4.67 ml/min/mmHg/L
DLCO UNC % PRED: 72 %
DLCO unc: 17.66 ml/min/mmHg
FEF 25-75 PRE: 0.82 L/s
FEF 25-75 Post: 1.42 L/sec
FEF2575-%CHANGE-POST: 72 %
FEF2575-%PRED-POST: 82 %
FEF2575-%Pred-Pre: 47 %
FEV1-%CHANGE-POST: 14 %
FEV1-%PRED-POST: 67 %
FEV1-%PRED-PRE: 59 %
FEV1-PRE: 1.27 L
FEV1-Post: 1.45 L
FEV1FVC-%CHANGE-POST: 1 %
FEV1FVC-%Pred-Pre: 95 %
FEV6-%Change-Post: 12 %
FEV6-%PRED-POST: 73 %
FEV6-%Pred-Pre: 64 %
FEV6-Post: 2 L
FEV6-Pre: 1.77 L
FEV6FVC-%CHANGE-POST: 0 %
FEV6FVC-%Pred-Post: 104 %
FEV6FVC-%Pred-Pre: 105 %
FVC-%Change-Post: 13 %
FVC-%PRED-POST: 70 %
FVC-%Pred-Pre: 62 %
FVC-Post: 2 L
FVC-Pre: 1.77 L
POST FEV1/FVC RATIO: 72 %
Post FEV6/FVC ratio: 100 %
Pre FEV1/FVC ratio: 72 %
Pre FEV6/FVC Ratio: 100 %
RV % pred: 94 %
RV: 2.13 L
TLC % PRED: 81 %
TLC: 4.13 L

## 2015-01-08 NOTE — Progress Notes (Signed)
PFT done today. 

## 2015-02-02 ENCOUNTER — Ambulatory Visit (INDEPENDENT_AMBULATORY_CARE_PROVIDER_SITE_OTHER): Payer: Medicare Other | Admitting: Emergency Medicine

## 2015-02-02 ENCOUNTER — Encounter: Payer: Self-pay | Admitting: Emergency Medicine

## 2015-02-02 VITALS — BP 110/72 | HR 81 | Ht 65.0 in | Wt 168.0 lb

## 2015-02-02 DIAGNOSIS — J4540 Moderate persistent asthma, uncomplicated: Secondary | ICD-10-CM

## 2015-02-02 MED ORDER — ALBUTEROL SULFATE HFA 108 (90 BASE) MCG/ACT IN AERS: 2.0000 | INHALATION_SPRAY | RESPIRATORY_TRACT | Status: DC | PRN

## 2015-02-02 MED ORDER — BUDESONIDE-FORMOTEROL FUMARATE 160-4.5 MCG/ACT IN AERO: 2.0000 | INHALATION_SPRAY | Freq: Two times a day (BID) | RESPIRATORY_TRACT | Status: DC

## 2015-02-02 MED ORDER — ALBUTEROL SULFATE HFA 108 (90 BASE) MCG/ACT IN AERS: 2.0000 | INHALATION_SPRAY | Freq: Four times a day (QID) | RESPIRATORY_TRACT | Status: DC | PRN

## 2015-02-02 NOTE — Patient Instructions (Signed)
We will restart Symbicort 160/4.5 g, 2 puffs twice a day. Please remember to rinse and gargle after using this medication Use pro-air 2 puffs up to every 4 hours if needed for shortness of breath Walking oximetry today Follow with Dr Lamonte Sakai in 2 months to assess your improvement on the medication, or sooner if you have any problems.

## 2015-02-02 NOTE — Progress Notes (Signed)
Subjective:    Patient ID: [name], female    DOB: [birth year], 74 y.o.   MRN: [id number]  HPI 75 yo never smoker, hx of HTN, DM, childhood asthma. She has severe arthritis, but is sero-negative for RA. She has been on Plaquanil from Jan - March. She was diagnosed with asthma sometime around 2007 when a cardiopulmonary exercise test showed obstructive lung disease - I have personally reviewed.  She has been on BD's before and thinks they helped her but she was able to improve and stop them. She hasn't been on BD's for a long time.   She reports feeling weak, fatigued, DOE even with 800 feet to the mailbox. Her husband has heard her wheeze. She coughs at night, wakes her from sleep. She has had a reassuring cardiac eval. With Dr Wynonia Lawman.  She underwent PFT 01/08/15 that I have reviewed > shows obstruction with a BD response.    Review of Systems  Constitutional: Negative for fever and unexpected weight change.  HENT: Negative for congestion, dental problem, ear pain, nosebleeds, postnasal drip, rhinorrhea, sinus pressure, sneezing, sore throat and trouble swallowing.   Eyes: Negative for redness and itching.  Respiratory: Positive for cough and shortness of breath. Negative for chest tightness and wheezing.   Cardiovascular: Negative for palpitations and leg swelling.  Gastrointestinal: Negative for nausea and vomiting.  Genitourinary: Negative for dysuria.  Musculoskeletal: Negative for joint swelling.  Skin: Negative for rash.  Neurological: Negative for headaches.  Hematological: Does not bruise/bleed easily.  Psychiatric/Behavioral: Negative for dysphoric mood. The patient is not nervous/anxious.    Past Medical History  Diagnosis Date  . Diabetes mellitus   . Hypertension   . Asthma   . S/P appy      Family History  Problem Relation Age of Onset  . Colon cancer Neg Hx      History   Social History  . Marital Status: Married    Spouse Name: N/A  . Number of  Children: N/A  . Years of Education: N/A   Occupational History  . Not on file.   Social History Main Topics  . Smoking status: Never Smoker   . Smokeless tobacco: Never Used  . Alcohol Use: No  . Drug Use: No  . Sexual Activity: Not on file   Other Topics Concern  . Not on file   Social History Narrative     Allergies  Allergen Reactions  . Vasotec Shortness Of Breath and Other (See Comments)    wheezing  . Codeine Nausea And Vomiting  . Lansoprazole Itching, Rash and Other (See Comments)    redness  . Morphine And Related Itching  . Sulfate Itching     Outpatient Prescriptions Prior to Visit  Medication Sig Dispense Refill  . albuterol (PROVENTIL) (2.5 MG/3ML) 0.083% nebulizer solution Take 3 mLs (2.5 mg total) by nebulization every 6 (six) hours as needed for wheezing or shortness of breath. 75 mL 12  . Albuterol Sulfate (PROAIR HFA IN) Inhale 2 puffs into the lungs as needed (wheezing, shortness of breath).     Marland Kitchen amitriptyline (ELAVIL) 50 MG tablet Take 50 mg by mouth at bedtime.    Marland Kitchen amLODipine (NORVASC) 10 MG tablet Take 10 mg by mouth daily.    Marland Kitchen gabapentin (NEURONTIN) 300 MG capsule Take 300 mg by mouth 4 (four) times daily.     Marland Kitchen guaiFENesin-dextromethorphan (ROBITUSSIN DM) 100-10 MG/5ML syrup Take 5 mLs by mouth every 4 (four) hours as needed for  cough. 118 mL 1  . hydroxychloroquine (PLAQUENIL) 200 MG tablet Take 1 tablet by mouth daily.  0  . Insulin Glargine (LANTUS SOLOSTAR [place]) Inject 56 Units into the skin at bedtime.    . insulin lispro (HUMALOG) 100 UNIT/ML SOCT Inject 22 Units into the skin 3 (three) times daily.     Marland Kitchen losartan (COZAAR) 100 MG tablet Take 100 mg by mouth daily.    . methocarbamol (ROBAXIN) 500 MG tablet Take 1-2 tablets (500-1,000 mg total) by mouth 4 (four) times daily as needed for muscle spasms. 60 tablet 0  . metoprolol (LOPRESSOR) 50 MG tablet Take 75 mg by mouth 2 (two) times daily.    . Multiple Vitamins-Minerals (PRESERVISION  AREDS 2 PO) Take by mouth 2 (two) times daily.    Marland Kitchen omeprazole (PRILOSEC) 20 MG capsule Take 20 mg by mouth 2 (two) times daily.    . traMADol (ULTRAM) 50 MG tablet Take 1 tablet (50 mg total) by mouth every 8 (eight) hours as needed for moderate pain. 20 tablet 0  . triamterene-hydrochlorothiazide (DYAZIDE) 37.5-25 MG per capsule Take 2 capsules by mouth every morning.    Marland Kitchen oseltamivir (TAMIFLU) 75 MG capsule Take 1 capsule (75 mg total) by mouth daily. 8 capsule 0  . atorvastatin (LIPITOR) 40 MG tablet Take 40 mg by mouth daily.    Marland Kitchen azithromycin (ZITHROMAX) 250 MG tablet Take 2 tablets (500 mg total) by mouth daily. 7 tablet 0   No facility-administered medications prior to visit.         Objective:   Physical Exam Filed Vitals:   02/02/15 1454  BP: 110/72  Pulse: 81  Height: 5\' 5"  (1.651 m)  Weight: 168 lb (76.204 kg)  SpO2: 96%   Gen: Pleasant, well-nourished, in no distress,  normal affect  ENT: No lesions,  mouth clear,  oropharynx clear, no postnasal drip  Neck: No JVD, no TMG, no carotid bruits  Lungs: No use of accessory muscles, no wheeze, clear without rales or rhonchi  Cardiovascular: RRR, heart sounds normal, no murmur or gallops, trace ankle peripheral edema  Musculoskeletal: No deformities, no cyanosis or clubbing  Neuro: alert, non focal  Skin: Warm, no lesions or rashes     Assessment & Plan:  Asthma, chronic Her pulmonary function testing from May as well as her cardiopulmonary exercise test from 2007 both confirm obstructive lung disease. She has a bronchodilator response consistent with asthma. I believe she needs to be back on controller medications. She is tolerated and done well with Symbicort in the past . We will restart Symbicort 2 puffs twice a day and have her use albuterol when necessary. Follow-up in 2 months to assess for improvement

## 2015-02-02 NOTE — Assessment & Plan Note (Signed)
Her pulmonary function testing from May as well as her cardiopulmonary exercise test from 2007 both confirm obstructive lung disease. She has a bronchodilator response consistent with asthma. I believe she needs to be back on controller medications. She is tolerated and done well with Symbicort in the past . We will restart Symbicort 2 puffs twice a day and have her use albuterol when necessary. Follow-up in 2 months to assess for improvement

## 2015-02-25 ENCOUNTER — Encounter: Payer: Self-pay | Admitting: Internal Medicine

## 2015-04-06 ENCOUNTER — Ambulatory Visit (INDEPENDENT_AMBULATORY_CARE_PROVIDER_SITE_OTHER): Payer: Medicare Other | Admitting: Emergency Medicine

## 2015-04-06 ENCOUNTER — Encounter: Payer: Self-pay | Admitting: Emergency Medicine

## 2015-04-06 VITALS — BP 122/60 | HR 70 | Ht 65.0 in | Wt 167.8 lb

## 2015-04-06 DIAGNOSIS — J3081 Allergic rhinitis due to animal (cat) (dog) hair and dander: Secondary | ICD-10-CM

## 2015-04-06 DIAGNOSIS — J4540 Moderate persistent asthma, uncomplicated: Secondary | ICD-10-CM | POA: Diagnosis not present

## 2015-04-06 DIAGNOSIS — J309 Allergic rhinitis, unspecified: Secondary | ICD-10-CM | POA: Insufficient documentation

## 2015-04-06 NOTE — Assessment & Plan Note (Signed)
Appears to have clinically improved with better exercise tolerance since starting the Symbicort. Will plan to continue this

## 2015-04-06 NOTE — Progress Notes (Signed)
   Subjective:    Patient ID: [name], female    DOB: 13-Jun-1941, 74 y.o.   MRN: [id number]  HPI 74 yo never smoker, hx of HTN, DM, childhood asthma. She has severe arthritis, but is sero-negative for RA. She has been on Plaquanil from Jan - March. She was diagnosed with asthma sometime around 2007 when a cardiopulmonary exercise test showed obstructive lung disease - I have personally reviewed.  She has been on BD's before and thinks they helped her but she was able to improve and stop them. She hasn't been on BD's for a long time.   She reports feeling weak, fatigued, DOE even with 800 feet to the mailbox. Her husband has heard her wheeze. She coughs at night, wakes her from sleep. She has had a reassuring cardiac eval. With Dr Wynonia Lawman.  She underwent PFT 01/08/15 that I have reviewed > shows obstruction with a BD response.   ROV 04/06/15 -- follow-up visit for dyspnea and wheezing. She has pulmonary function testing that identified obstructive lung disease and asthmatic pattern as above. Last visit we restarted Symbicort and she has been better - she was more active, able to walk. She has had recent exposure to cats / dogs 4-5 days ago that have caused hoarse voice, cough, congestion.     Review of Systems  Constitutional: Negative for fever and unexpected weight change.  HENT: Negative for congestion, dental problem, ear pain, nosebleeds, postnasal drip, rhinorrhea, sinus pressure, sneezing, sore throat and trouble swallowing.   Eyes: Negative for redness and itching.  Respiratory: Positive for cough and shortness of breath. Negative for chest tightness and wheezing.   Cardiovascular: Negative for palpitations and leg swelling.  Gastrointestinal: Negative for nausea and vomiting.  Genitourinary: Negative for dysuria.  Musculoskeletal: Negative for joint swelling.  Skin: Negative for rash.  Neurological: Negative for headaches.  Hematological: Does not bruise/bleed easily.    Psychiatric/Behavioral: Negative for dysphoric mood. The patient is not nervous/anxious.        Objective:   Physical Exam Filed Vitals:   04/06/15 1016  BP: 122/60  Pulse: 70  Height: 5\' 5"  (1.651 m)  Weight: 167 lb 12.8 oz (76.114 kg)  SpO2: 97%   Gen: Pleasant, well-nourished, in no distress,  normal affect  ENT: No lesions,  mouth clear,  oropharynx clear, no postnasal drip, hoarse voice  Neck: No JVD, no TMG, no carotid bruits  Lungs: No use of accessory muscles, no wheeze, clear without rales or rhonchi  Cardiovascular: RRR, heart sounds normal, no murmur or gallops, trace ankle peripheral edema  Musculoskeletal: No deformities, no cyanosis or clubbing  Neuro: alert, non focal  Skin: Warm, no lesions or rashes     Assessment & Plan:  Asthma, chronic Appears to have clinically improved with better exercise tolerance since starting the Symbicort. Will plan to continue this  Allergic rhinitis Acutely active due to a recent exposure to dog and cats. She is no longer being exposed but continues to have hoarse voice, congestion, cough. I reviewed with her that the Symbicort could be a throat irritation and that she needs to be observant to see if she can note a  Connection. Importantly I believe she needs to be treated briefly with loratadine 10 mg daily to get over the recent exposure. After one week she can switch to when necessary

## 2015-04-06 NOTE — Patient Instructions (Signed)
Please continue your Symbicort twice a day . Rinse and gargle after using this medication Try starting Claritin (loratadine) 10 mg daily for 1 week. Then you may switch to using just as needed Follow with Dr Lamonte Sakai in 6 months or sooner if you have any problems

## 2015-04-06 NOTE — Assessment & Plan Note (Signed)
Acutely active due to a recent exposure to dog and cats. She is no longer being exposed but continues to have hoarse voice, congestion, cough. I reviewed with her that the Symbicort could be a throat irritation and that she needs to be observant to see if she can note a  Connection. Importantly I believe she needs to be treated briefly with loratadine 10 mg daily to get over the recent exposure. After one week she can switch to when necessary

## 2015-04-26 ENCOUNTER — Other Ambulatory Visit: Payer: Self-pay | Admitting: Orthopedic Surgery

## 2015-04-26 DIAGNOSIS — M5032 Other cervical disc degeneration, mid-cervical region, unspecified level: Secondary | ICD-10-CM

## 2015-04-29 ENCOUNTER — Ambulatory Visit
Admission: RE | Admit: 2015-04-29 | Discharge: 2015-04-29 | Disposition: A | Discharge status: Home / Self Care | Payer: Medicare Other | Source: Ambulatory Visit | Attending: Orthopedic Surgery | Admitting: Orthopedic Surgery

## 2015-04-29 DIAGNOSIS — M5032 Other cervical disc degeneration, mid-cervical region, unspecified level: Secondary | ICD-10-CM

## 2015-05-11 ENCOUNTER — Telehealth: Payer: Self-pay | Admitting: Internal Medicine

## 2015-05-11 NOTE — Telephone Encounter (Signed)
Pt states she had a lab test from [facility] come back that states she had blood in her stool. Asked pt if she had contacted her PCP and she has not. Discussed with her that she should contact her PCP and if he feels she needs to be seen by GI he would refer her back to [place]. Pt verbalized understanding.

## 2015-05-13 ENCOUNTER — Inpatient Hospital Stay (HOSPITAL_COMMUNITY)
Admission: EM | Admit: 2015-05-13 | Discharge: 2015-05-13 | DRG: 378 | Disposition: A | Discharge status: Home / Self Care | Payer: Medicare Other | Attending: Internal Medicine | Admitting: Internal Medicine

## 2015-05-13 ENCOUNTER — Encounter (HOSPITAL_COMMUNITY): Payer: Self-pay | Admitting: *Deleted

## 2015-05-13 DIAGNOSIS — D62 Acute posthemorrhagic anemia: Secondary | ICD-10-CM | POA: Diagnosis present

## 2015-05-13 DIAGNOSIS — G629 Polyneuropathy, unspecified: Secondary | ICD-10-CM

## 2015-05-13 DIAGNOSIS — Z882 Allergy status to sulfonamides status: Secondary | ICD-10-CM

## 2015-05-13 DIAGNOSIS — Z8249 Family history of ischemic heart disease and other diseases of the circulatory system: Secondary | ICD-10-CM | POA: Diagnosis not present

## 2015-05-13 DIAGNOSIS — E1142 Type 2 diabetes mellitus with diabetic polyneuropathy: Secondary | ICD-10-CM | POA: Diagnosis present

## 2015-05-13 DIAGNOSIS — Z90710 Acquired absence of both cervix and uterus: Secondary | ICD-10-CM | POA: Diagnosis not present

## 2015-05-13 DIAGNOSIS — K922 Gastrointestinal hemorrhage, unspecified: Secondary | ICD-10-CM | POA: Diagnosis present

## 2015-05-13 DIAGNOSIS — Z885 Allergy status to narcotic agent status: Secondary | ICD-10-CM | POA: Diagnosis not present

## 2015-05-13 DIAGNOSIS — J45909 Unspecified asthma, uncomplicated: Secondary | ICD-10-CM | POA: Diagnosis present

## 2015-05-13 DIAGNOSIS — Z801 Family history of malignant neoplasm of trachea, bronchus and lung: Secondary | ICD-10-CM

## 2015-05-13 DIAGNOSIS — I10 Essential (primary) hypertension: Secondary | ICD-10-CM | POA: Diagnosis present

## 2015-05-13 DIAGNOSIS — Z79899 Other long term (current) drug therapy: Secondary | ICD-10-CM

## 2015-05-13 DIAGNOSIS — Z888 Allergy status to other drugs, medicaments and biological substances status: Secondary | ICD-10-CM

## 2015-05-13 DIAGNOSIS — M1990 Unspecified osteoarthritis, unspecified site: Secondary | ICD-10-CM | POA: Diagnosis present

## 2015-05-13 DIAGNOSIS — R531 Weakness: Secondary | ICD-10-CM | POA: Diagnosis not present

## 2015-05-13 DIAGNOSIS — Z794 Long term (current) use of insulin: Secondary | ICD-10-CM | POA: Diagnosis not present

## 2015-05-13 DIAGNOSIS — D649 Anemia, unspecified: Secondary | ICD-10-CM

## 2015-05-13 HISTORY — DX: Gastrointestinal hemorrhage, unspecified: K92.2

## 2015-05-13 LAB — COMPREHENSIVE METABOLIC PANEL
ALT: 15 U/L (ref 14–54)
AST: 20 U/L (ref 15–41)
Albumin: 4.2 g/dL (ref 3.5–5.0)
Alkaline Phosphatase: 91 U/L (ref 38–126)
Anion gap: 9 (ref 5–15)
BUN: 22 mg/dL — ABNORMAL HIGH (ref 6–20)
CHLORIDE: 102 mmol/L (ref 101–111)
CO2: 27 mmol/L (ref 22–32)
CREATININE: 0.89 mg/dL (ref 0.44–1.00)
Calcium: 9.1 mg/dL (ref 8.9–10.3)
Glucose, Bld: 171 mg/dL — ABNORMAL HIGH (ref 65–99)
POTASSIUM: 4.5 mmol/L (ref 3.5–5.1)
SODIUM: 138 mmol/L (ref 135–145)
Total Bilirubin: 0.2 mg/dL — ABNORMAL LOW (ref 0.3–1.2)
Total Protein: 7.3 g/dL (ref 6.5–8.1)

## 2015-05-13 LAB — CBC
HEMATOCRIT: 30.8 % — AB (ref 36.0–46.0)
HEMOGLOBIN: 9.2 g/dL — AB (ref 12.0–15.0)
MCH: 23.8 pg — AB (ref 26.0–34.0)
MCHC: 29.9 g/dL — ABNORMAL LOW (ref 30.0–36.0)
MCV: 79.6 fL (ref 78.0–100.0)
PLATELETS: 344 10*3/uL (ref 150–400)
RBC: 3.87 MIL/uL (ref 3.87–5.11)
RDW: 16.6 % — ABNORMAL HIGH (ref 11.5–15.5)
WBC: 9.4 10*3/uL (ref 4.0–10.5)

## 2015-05-13 LAB — TYPE AND SCREEN
ABO/RH(D): A POS
Antibody Screen: NEGATIVE

## 2015-05-13 LAB — POC OCCULT BLOOD, ED: FECAL OCCULT BLD: NEGATIVE

## 2015-05-13 LAB — CBG MONITORING, ED: GLUCOSE-CAPILLARY: 164 mg/dL — AB (ref 65–99)

## 2015-05-13 LAB — ABO/RH: ABO/RH(D): A POS

## 2015-05-13 MED ORDER — SODIUM CHLORIDE 0.9 % IV BOLUS (SEPSIS)
1000.0000 mL | Freq: Once | INTRAVENOUS | Status: AC
  Administered 2015-05-13: 1000 mL via INTRAVENOUS

## 2015-05-13 NOTE — H&P (Signed)
[facility] History and Physical  [name] IWL:[id number] DOB: 11-19-1940 DOA: 05/13/2015  Referring physician: Dr. Lacinda Axon PCP: [name], MD   Chief Complaint: generalized weaknes  HPI: [name] is a 74 y.o. female past medical history of essential hypertension, ulcer arthritis and diabetes that comes in for generalized weakness that started 1 week prior to admission progressively getting worse. She denies any NSAIDs use she uses Tylenol for her osteoarthritis and she also relates she's been having melanotic stools for the past week intermittently. He relates also some left upper quadrant abdominal pain and nothing makes it better or worse. She relates she was to see her PCP today labs were drawn and there was a drop in hemoglobin from 11-9 she was referred to the ED. Guaiac was done that was positive for blood.  In the ED: Basic metabolic panel was done that was unremarkable, a CBC was done that shows a hemoglobin of 9 with an MCV of 79 and RDW of 16.6 square consulted for further evaluation.    Review of Systems:  Constitutional:  No weight loss, night sweats, Fevers, chills, fatigue.  HEENT:  No headaches, Difficulty swallowing,Tooth/dental problems,Sore throat,  No sneezing, itching, ear ache, nasal congestion, post nasal drip,  Cardio-vascular:  No chest pain, Orthopnea, PND, swelling in lower extremities, anasarca, dizziness, palpitations  GI:  No heartburn, indigestion,  change in bowel habits, loss of appetite  Resp:  No shortness of breath with exertion or at rest. No excess mucus, no productive cough, No non-productive cough, No coughing up of blood.No change in color of mucus.No wheezing.No chest wall deformity  Skin:  no rash or lesions.  GU:  no dysuria, change in color of urine, no urgency or frequency. No flank pain.  Musculoskeletal:  No joint pain or swelling. No decreased range of motion. No back pain.  Psych:  No change in mood  or affect. No depression or anxiety. No memory loss.   Past Medical History  Diagnosis Date  . Diabetes mellitus   . Hypertension   . Asthma   . S/P appy    Past Surgical History  Procedure Laterality Date  . Appendectomy  1952  . Vaginal hysterectomy  1977  . Knee arthroscopy  2003 2005    right knee  . Mass fallopian tube    . Incontinence surgery     Social History:  reports that she has never smoked. She has never used smokeless tobacco. She reports that she does not drink alcohol or use illicit drugs.  Allergies  Allergen Reactions  . Vasotec Shortness Of Breath and Other (See Comments)    wheezing  . Codeine Nausea And Vomiting  . Lansoprazole Itching, Rash and Other (See Comments)    redness  . Morphine And Related Itching  . Sulfate Itching    Family History  Problem Relation Age of Onset  . Colon cancer Neg Hx   . Lung cancer Mother   . Heart attack Father    Prior to Admission medications   Medication Sig Start Date End Date Taking? Authorizing Provider  albuterol (PROAIR HFA) 108 (90 BASE) MCG/ACT inhaler Inhale 2 puffs into the lungs every 4 (four) hours as needed for wheezing or shortness of breath. 02/02/15  Yes Collene Gobble, MD  amitriptyline (ELAVIL) 50 MG tablet Take 50 mg by mouth at bedtime.   Yes Historical Provider, MD  amLODipine (NORVASC) 10 MG tablet Take 10 mg by mouth daily.   Yes Historical Provider,  MD  budesonide-formoterol (SYMBICORT) 160-4.5 MCG/ACT inhaler Inhale 2 puffs into the lungs 2 (two) times daily. Patient taking differently: Inhale 2 puffs into the lungs 2 (two) times daily as needed (Wheezing, cough).  02/02/15  Yes Collene Gobble, MD  esomeprazole (NEXIUM) 40 MG capsule Take 40 mg by mouth daily. 04/23/15  Yes Historical Provider, MD  gabapentin (NEURONTIN) 300 MG capsule Take 300 mg by mouth 4 (four) times daily.    Yes Historical Provider, MD  Insulin Glargine (LANTUS SOLOSTAR [place]) Inject 57 Units into the skin at bedtime.    Yes  Historical Provider, MD  insulin lispro (HUMALOG) 100 UNIT/ML SOCT Inject 22-28 Units into the skin 3 (three) times daily with meals. Sliding scale   Yes Historical Provider, MD  losartan (COZAAR) 100 MG tablet Take 100 mg by mouth daily.   Yes Historical Provider, MD  methocarbamol (ROBAXIN) 500 MG tablet Take 1-2 tablets (500-1,000 mg total) by mouth 4 (four) times daily as needed for muscle spasms. 09/04/13  Yes Shawnee Knapp, MD  metoprolol (LOPRESSOR) 50 MG tablet Take 75 mg by mouth 2 (two) times daily.   Yes Historical Provider, MD  Multiple Vitamins-Minerals (PRESERVISION AREDS 2 PO) Take 1 tablet by mouth 2 (two) times daily.    Yes Historical Provider, MD  tiZANidine (ZANAFLEX) 4 MG tablet Take 4 mg by mouth every 8 (eight) hours as needed for muscle spasms.  04/20/15  Yes Historical Provider, MD  triamterene-hydrochlorothiazide (DYAZIDE) 37.5-25 MG per capsule Take 2 capsules by mouth daily after lunch.    Yes Historical Provider, MD  albuterol (PROVENTIL) (2.5 MG/3ML) 0.083% nebulizer solution Take 3 mLs (2.5 mg total) by nebulization every 6 (six) hours as needed for wheezing or shortness of breath. Patient not taking: Reported on 05/13/2015 09/10/13   Theodis Blaze, MD  guaiFENesin-dextromethorphan [facility] DM) 100-10 MG/5ML syrup Take 5 mLs by mouth every 4 (four) hours as needed for cough. Patient not taking: Reported on 05/13/2015 09/10/13   Theodis Blaze, MD  traMADol (ULTRAM) 50 MG tablet Take 1 tablet (50 mg total) by mouth every 8 (eight) hours as needed for moderate pain. Patient not taking: Reported on 05/13/2015 09/04/13   Shawnee Knapp, MD   Physical Exam: Filed Vitals:   05/13/15 1349 05/13/15 1619  BP: 159/59 176/69  Pulse: 83 86  Temp: 98.6 F (37 C)   TempSrc: Oral   Resp: 18 15  SpO2: 99% 98%    Wt Readings from Last 3 Encounters:  04/06/15 76.114 kg (167 lb 12.8 oz)  02/02/15 76.204 kg (168 lb)  09/07/13 73.9 kg (162 lb 14.7 oz)    General:  Appears calm and  comfortable Eyes: PERRL, normal lids, irises & conjunctiva ENT: grossly normal hearing, lips & tongue Neck: no LAD, masses or thyromegaly Cardiovascular: RRR, no m/r/g. No LE edema. Telemetry: SR, no arrhythmias  Respiratory: CTA bilaterally, no w/r/r. Normal respiratory effort. Abdomen: soft, left upper quadrant tenderness no rebound or guarding Skin: no rash or induration seen on limited exam Musculoskeletal: grossly normal tone BUE/BLE Psychiatric: grossly normal mood and affect, speech fluent and appropriate Neurologic: grossly non-focal.          Labs on Admission:  Basic Metabolic Panel:  Recent Labs Lab 05/13/15 1524  NA 138  K 4.5  CL 102  CO2 27  GLUCOSE 171*  BUN 22*  CREATININE 0.89  CALCIUM 9.1   Liver Function Tests:  Recent Labs Lab 05/13/15 1524  AST 20  ALT 15  ALKPHOS 91  BILITOT 0.2*  PROT 7.3  ALBUMIN 4.2   No results for input(s): LIPASE, AMYLASE in the last 168 hours. No results for input(s): AMMONIA in the last 168 hours. CBC:  Recent Labs Lab 05/13/15 1524  WBC 9.4  HGB 9.2*  HCT 30.8*  MCV 79.6  PLT 344   Cardiac Enzymes: No results for input(s): CKTOTAL, CKMB, CKMBINDEX, TROPONINI in the last 168 hours.  BNP (last 3 results) No results for input(s): BNP in the last 8760 hours.  ProBNP (last 3 results) No results for input(s): PROBNP in the last 8760 hours.  CBG:  Recent Labs Lab 05/13/15 1517  GLUCAP 164*    Radiological Exams on Admission: No results found.  EKG: Independently reviewed.   Assessment/Plan Acute GI bleeding/Acute blood loss anemia/Normocytic anemia: - I will go ahead and type and screen her and transfuse her one unit as she complains of generalized weakness and fatigue just by moving from the bed to the chair, I will give her a liter of normal saline place 2 large-bore IVs, will start on IV Protonix twice a day. Check a CBC posttransfusion. We'll give her full liquid diet nothing by mouth after  midnight the ED physician has already consulted GI for possible endoscopy in the morning. We'll check an anemia panel  Essential HTN (hypertension): - We'll continue metoprolol hold all other antihypertensive medication.  DM type 2 with diabetic peripheral neuropathy: - Her full liquid diet nothing by mouth after midnight we'll decrease her Lantus to 15 units twice a day and sliding scale insulin. - Check a hemoglobin A1c.    Code Status: full DVT Prophylaxis:SCD's Family Communication: husband Disposition Plan: inpatient  Time spent: 60 min  FELIZ Marguarite Arbour [facility] Pager [phone]

## 2015-05-13 NOTE — ED Provider Notes (Signed)
CSN: [id number]     Arrival date & time 05/13/15  1338 History   First MD Initiated Contact with Patient 05/13/15 1615     Chief Complaint  Patient presents with  . Abnormal Lab  . GI Bleeding     (Consider location/radiation/quality/duration/timing/severity/associated sxs/prior Treatment) HPI..... Patient's chief complaint is weakness. She was seen by her primary care doctor today and her hemoglobin was lower. She has complained of black stools. Hemoglobin on 02/12/15 was 11.4.   Today in the office,  hemoglobin was 8.9.  She is presently on no blood thinners or non-steroidal agents. She is ambulatory. Severity is moderate.  Past Medical History  Diagnosis Date  . Diabetes mellitus   . Hypertension   . Asthma   . S/P appy    Past Surgical History  Procedure Laterality Date  . Appendectomy  1952  . Vaginal hysterectomy  1977  . Knee arthroscopy  2003 2005    right knee  . Mass fallopian tube    . Incontinence surgery     Family History  Problem Relation Age of Onset  . Colon cancer Neg Hx   . Lung cancer Mother   . Heart attack Father    Social History  Substance Use Topics  . Smoking status: Never Smoker   . Smokeless tobacco: Never Used  . Alcohol Use: No   OB History    No data available     Review of Systems  All other systems reviewed and are negative.     Allergies  Vasotec; Codeine; Lansoprazole; Morphine and related; and Sulfate  Home Medications   Prior to Admission medications   Medication Sig Start Date End Date Taking? Authorizing Provider  albuterol (PROAIR HFA) 108 (90 BASE) MCG/ACT inhaler Inhale 2 puffs into the lungs every 4 (four) hours as needed for wheezing or shortness of breath. 02/02/15  Yes Collene Gobble, MD  amitriptyline (ELAVIL) 50 MG tablet Take 50 mg by mouth at bedtime.   Yes Historical Provider, MD  amLODipine (NORVASC) 10 MG tablet Take 10 mg by mouth daily.   Yes Historical Provider, MD  budesonide-formoterol (SYMBICORT)  160-4.5 MCG/ACT inhaler Inhale 2 puffs into the lungs 2 (two) times daily. Patient taking differently: Inhale 2 puffs into the lungs 2 (two) times daily as needed (Wheezing, cough).  02/02/15  Yes Collene Gobble, MD  esomeprazole (NEXIUM) 40 MG capsule Take 40 mg by mouth daily. 04/23/15  Yes Historical Provider, MD  gabapentin (NEURONTIN) 300 MG capsule Take 300 mg by mouth 4 (four) times daily.    Yes Historical Provider, MD  Insulin Glargine (LANTUS SOLOSTAR [place]) Inject 57 Units into the skin at bedtime.    Yes Historical Provider, MD  insulin lispro (HUMALOG) 100 UNIT/ML SOCT Inject 22-28 Units into the skin 3 (three) times daily with meals. Sliding scale   Yes Historical Provider, MD  losartan (COZAAR) 100 MG tablet Take 100 mg by mouth daily.   Yes Historical Provider, MD  methocarbamol (ROBAXIN) 500 MG tablet Take 1-2 tablets (500-1,000 mg total) by mouth 4 (four) times daily as needed for muscle spasms. 09/04/13  Yes Shawnee Knapp, MD  metoprolol (LOPRESSOR) 50 MG tablet Take 75 mg by mouth 2 (two) times daily.   Yes Historical Provider, MD  Multiple Vitamins-Minerals (PRESERVISION AREDS 2 PO) Take 1 tablet by mouth 2 (two) times daily.    Yes Historical Provider, MD  tiZANidine (ZANAFLEX) 4 MG tablet Take 4 mg by mouth every 8 (eight) hours  as needed for muscle spasms.  04/20/15  Yes Historical Provider, MD  triamterene-hydrochlorothiazide (DYAZIDE) 37.5-25 MG per capsule Take 2 capsules by mouth daily after lunch.    Yes Historical Provider, MD  albuterol (PROVENTIL) (2.5 MG/3ML) 0.083% nebulizer solution Take 3 mLs (2.5 mg total) by nebulization every 6 (six) hours as needed for wheezing or shortness of breath. Patient not taking: Reported on 05/13/2015 09/10/13   Theodis Blaze, MD  guaiFENesin-dextromethorphan [facility] DM) 100-10 MG/5ML syrup Take 5 mLs by mouth every 4 (four) hours as needed for cough. Patient not taking: Reported on 05/13/2015 09/10/13   Theodis Blaze, MD  traMADol (ULTRAM) 50 MG  tablet Take 1 tablet (50 mg total) by mouth every 8 (eight) hours as needed for moderate pain. Patient not taking: Reported on 05/13/2015 09/04/13   Shawnee Knapp, MD   BP 127/100 mmHg  Pulse 91  Temp(Src) 99.4 F (37.4 C) (Oral)  Resp 16  SpO2 96% Physical Exam  Constitutional: She is oriented to person, place, and time. She appears well-developed and well-nourished.  HENT:  Head: Normocephalic and atraumatic.  Eyes: Conjunctivae and EOM are normal. Pupils are equal, round, and reactive to light.  Neck: Normal range of motion. Neck supple.  Cardiovascular: Normal rate and regular rhythm.   Pulmonary/Chest: Effort normal and breath sounds normal.  Abdominal: Soft. Bowel sounds are normal.  Genitourinary:  Rectal exam:  No masses. No black stool.  Musculoskeletal: Normal range of motion.  Neurological: She is alert and oriented to person, place, and time.  Skin: Skin is warm and dry.  Psychiatric: She has a normal mood and affect. Her behavior is normal.  Nursing note and vitals reviewed.   ED Course  Procedures (including critical care time) Labs Review Labs Reviewed  COMPREHENSIVE METABOLIC PANEL - Abnormal; Notable for the following:    Glucose, Bld 171 (*)    BUN 22 (*)    Total Bilirubin 0.2 (*)    All other components within normal limits  CBC - Abnormal; Notable for the following:    Hemoglobin 9.2 (*)    HCT 30.8 (*)    MCH 23.8 (*)    MCHC 29.9 (*)    RDW 16.6 (*)    All other components within normal limits  CBG MONITORING, ED - Abnormal; Notable for the following:    Glucose-Capillary 164 (*)    All other components within normal limits  POC OCCULT BLOOD, ED  TYPE AND SCREEN  ABO/RH    Imaging Review No results found. I have personally reviewed and evaluated these images and lab results as part of my medical decision-making.   EKG Interpretation None      MDM   Final diagnoses:  Upper GI bleed    Patient is hemodynamically stable. Rectal negative  for blood. Hemoglobin 9.2. Discussed with Dr. Lizbeth Bark.  He will perform upper endoscopy tomorrow. Discussed with patient and her husband.    Nat Christen, MD 05/13/15 [phone]

## 2015-05-13 NOTE — Discharge Instructions (Signed)
You will see Dr. Lizbeth Bark tomorrow. Arrive at the office at 11:15 downstairs in the endoscopy suite. Address given in your discharge instruction. Nothing to eat after midnight. Take your normal medications in the morning with a small amount of water.

## 2015-05-13 NOTE — ED Notes (Signed)
Pt has paperwork that shows an appointment for a colonoscopy on 9/18.

## 2015-05-13 NOTE — ED Notes (Signed)
19:05 to floor.

## 2015-05-13 NOTE — ED Notes (Signed)
Pt alert,oriented, and wheeled to the car by this RN. She was advised to follow up with Dr. Watt Climes tomorrow at [phone]. Pt leaving with husband.

## 2015-05-13 NOTE — ED Notes (Signed)
Pt was sent here from PCP office with c/o positive hemoccult and lowe hemoglobin. Pt sts noticed black stools few weeks ago, but that is now resolving, she reports feeling weak, c/o pain in the LUQ.

## 2015-05-14 ENCOUNTER — Other Ambulatory Visit: Payer: Self-pay | Admitting: Gastroenterology

## 2015-05-14 DIAGNOSIS — D649 Anemia, unspecified: Secondary | ICD-10-CM

## 2015-05-14 DIAGNOSIS — R109 Unspecified abdominal pain: Secondary | ICD-10-CM

## 2015-05-19 ENCOUNTER — Ambulatory Visit
Admission: RE | Admit: 2015-05-19 | Discharge: 2015-05-19 | Disposition: A | Discharge status: Home / Self Care | Payer: Medicare Other | Source: Ambulatory Visit | Attending: Gastroenterology | Admitting: Gastroenterology

## 2015-05-19 DIAGNOSIS — R109 Unspecified abdominal pain: Secondary | ICD-10-CM

## 2015-05-19 DIAGNOSIS — D649 Anemia, unspecified: Secondary | ICD-10-CM

## 2015-05-19 MED ORDER — IOPAMIDOL (ISOVUE-300) INJECTION 61%
100.0000 mL | Freq: Once | INTRAVENOUS | Status: AC | PRN
  Administered 2015-05-19: 100 mL via INTRAVENOUS

## 2015-06-03 ENCOUNTER — Other Ambulatory Visit: Payer: Self-pay | Admitting: Family Medicine

## 2015-06-03 DIAGNOSIS — R918 Other nonspecific abnormal finding of lung field: Secondary | ICD-10-CM

## 2015-06-09 ENCOUNTER — Ambulatory Visit
Admission: RE | Admit: 2015-06-09 | Discharge: 2015-06-09 | Disposition: A | Discharge status: Home / Self Care | Payer: Medicare Other | Source: Ambulatory Visit | Attending: Family Medicine | Admitting: Family Medicine

## 2015-06-09 DIAGNOSIS — R918 Other nonspecific abnormal finding of lung field: Secondary | ICD-10-CM

## 2015-08-10 ENCOUNTER — Ambulatory Visit: Payer: Self-pay | Admitting: Physician Assistant

## 2015-09-06 ENCOUNTER — Encounter (HOSPITAL_COMMUNITY): Payer: Self-pay

## 2015-09-06 ENCOUNTER — Encounter (HOSPITAL_COMMUNITY)
Admission: RE | Admit: 2015-09-06 | Discharge: 2015-09-06 | Disposition: A | Discharge status: Home / Self Care | Payer: Medicare Other | Source: Ambulatory Visit | Attending: Orthopedic Surgery | Admitting: Orthopedic Surgery

## 2015-09-06 DIAGNOSIS — Y831 Surgical operation with implant of artificial internal device as the cause of abnormal reaction of the patient, or of later complication, without mention of misadventure at the time of the procedure: Secondary | ICD-10-CM | POA: Diagnosis not present

## 2015-09-06 DIAGNOSIS — T85113A Breakdown (mechanical) of implanted electronic neurostimulator, generator, initial encounter: Secondary | ICD-10-CM | POA: Diagnosis not present

## 2015-09-06 DIAGNOSIS — Z7951 Long term (current) use of inhaled steroids: Secondary | ICD-10-CM | POA: Diagnosis not present

## 2015-09-06 DIAGNOSIS — Z79899 Other long term (current) drug therapy: Secondary | ICD-10-CM | POA: Diagnosis not present

## 2015-09-06 DIAGNOSIS — Z792 Long term (current) use of antibiotics: Secondary | ICD-10-CM | POA: Diagnosis not present

## 2015-09-06 DIAGNOSIS — E119 Type 2 diabetes mellitus without complications: Secondary | ICD-10-CM | POA: Diagnosis not present

## 2015-09-06 DIAGNOSIS — M1990 Unspecified osteoarthritis, unspecified site: Secondary | ICD-10-CM | POA: Diagnosis not present

## 2015-09-06 DIAGNOSIS — I10 Essential (primary) hypertension: Secondary | ICD-10-CM | POA: Diagnosis not present

## 2015-09-06 DIAGNOSIS — Z794 Long term (current) use of insulin: Secondary | ICD-10-CM | POA: Diagnosis not present

## 2015-09-06 DIAGNOSIS — K219 Gastro-esophageal reflux disease without esophagitis: Secondary | ICD-10-CM | POA: Diagnosis not present

## 2015-09-06 DIAGNOSIS — J45909 Unspecified asthma, uncomplicated: Secondary | ICD-10-CM | POA: Diagnosis not present

## 2015-09-06 HISTORY — DX: Nausea with vomiting, unspecified: R11.2

## 2015-09-06 HISTORY — DX: Gastro-esophageal reflux disease without esophagitis: K21.9

## 2015-09-06 HISTORY — DX: Other specified postprocedural states: Z98.890

## 2015-09-06 HISTORY — DX: Cardiac murmur, unspecified: R01.1

## 2015-09-06 HISTORY — DX: Unspecified osteoarthritis, unspecified site: M19.90

## 2015-09-06 LAB — CBC
HEMATOCRIT: 36.3 % (ref 36.0–46.0)
HEMOGLOBIN: 10.9 g/dL — AB (ref 12.0–15.0)
MCH: 26.3 pg (ref 26.0–34.0)
MCHC: 30 g/dL (ref 30.0–36.0)
MCV: 87.7 fL (ref 78.0–100.0)
Platelets: 255 10*3/uL (ref 150–400)
RBC: 4.14 MIL/uL (ref 3.87–5.11)
RDW: 15.6 % — AB (ref 11.5–15.5)
WBC: 8.8 10*3/uL (ref 4.0–10.5)

## 2015-09-06 LAB — BASIC METABOLIC PANEL
ANION GAP: 8 (ref 5–15)
BUN: 18 mg/dL (ref 6–20)
CHLORIDE: 103 mmol/L (ref 101–111)
CO2: 29 mmol/L (ref 22–32)
Calcium: 9.3 mg/dL (ref 8.9–10.3)
Creatinine, Ser: 1.08 mg/dL — ABNORMAL HIGH (ref 0.44–1.00)
GFR calc Af Amer: 57 mL/min — ABNORMAL LOW (ref >60)
GFR calc non Af Amer: 49 mL/min — ABNORMAL LOW (ref >60)
GLUCOSE: 135 mg/dL — AB (ref 65–99)
POTASSIUM: 4.3 mmol/L (ref 3.5–5.1)
Sodium: 140 mmol/L (ref 135–145)

## 2015-09-06 LAB — SURGICAL PCR SCREEN
MRSA, PCR: NEGATIVE
STAPHYLOCOCCUS AUREUS: NEGATIVE

## 2015-09-06 LAB — GLUCOSE, CAPILLARY: Glucose-Capillary: 142 mg/dL — ABNORMAL HIGH (ref 65–99)

## 2015-09-06 NOTE — Progress Notes (Signed)
Anesthesia PAT Evaluation: Patient is a 75 year old female scheduled for spinal cord stimulator insertion on 09/09/15 by Dr. Rolena Infante.  History includes non-smoker, post-operative N/V (severe), DM2, HTN, asthma, GERD, murmur, arthritis, appendectomy, hysterectomy, right total joint (not specified). Had EGD by Dr. Watt Climes ~ 04/2015 for anemia work-up. Reported no significant findings. She was started on an iron supplement which she is still taking. She also reports that a colonoscopy was not repeated due to one being done just two years ago. Reports stool hemoccult was negative at Dr. Perley Jain office.  She reports significant issues with post-operative N/V in the past. She has N/V with codeine and itching with Morphine. Has done well with tramadol and muscle relaxants for pain management. We discussed that patients are given anti-nausea medication peri-operatively (ie, Zofran), and her anesthesia team could assess for re-dosing and/or additional agents as indicated. She was encouraged to talk with her anesthesiologist as well on the day of surgery.  PCP is Dr. Leighton Ruff. Endocrinologist is Dr. Buddy Duty. Pulmonologist is Dr. Lamonte Sakai. She saw cardiologist Dr. Wynonia Lawman earlier this year and had an echo and nuclear stress test. She last saw him for 07/30/15 for pre-operative evaluation. He did not recommend any additional cardiac testing prior to surgery. PRN cardiology follow-up recommended.    PAT Vitals: BP 133/47, HR 76, T 36.7C, RR 20. O2 sat 99%. CBG 142.  Heart RRR. I did not appreciate a murmur. Lungs clear. Small mouth opening. Denied loss teeth or denture/partials.  Meds include albuterol, amitriptyline, amlodipine, Symbicort, Nexium, Neurontin, Lantus, Humalog, losartan, Robaxin, Lopressor, Zanaflex, tramadol, triamterene-HCTZ.  07/30/15 EKG (Dr. Wynonia Lawman): SR, LVH by voltage.  12/24/14 Exercise Nuclear stress test (Dr. Wynonia Lawman): Impression: 1. Normal stress Myoview study with no evidence of ischemia or  infarction. 2. Normal quantitative gated SPECT EF of 72% with normal wall motion.  3. Reduced exercise tolerance for age.  Recommendations: This study shows no evidence of myocardial ischemia. Her exercise capacity severely reduced however that limits the prognostic value of the test.  12/21/14 Echo (Dr. Wynonia Lawman): Conclusion: 1. Mild concentric LVH with normal LV systolic function. Estimated EF 55%. 2. Mild left atrial enlargement. 3. Mild mitral and tricuspid regurgitation. 01/08/15 PFTs: FVC 1.77 (62% pre, 70% post BD), FEV1 1.27 (59% pre, 67% post BD), DLCOunc 17.66 (72%).  06/09/15 Chest CT w/o contrast: IMPRESSION: 3 mm right lower lobe nodule as noted on CT abdomen is unchanged. No other lung nodule. No mass or adenopathy. Lungs are clear. If the patient is at high risk for bronchogenic carcinoma, follow-up chest CT at 1 year is recommended. If the patient is at low risk, no follow-up is needed. This recommendation follows the consensus statement: Guidelines for Management of Small Pulmonary Nodules Detected on CT Scans: A Statement from the [employer] as published in Radiology 2005; 237:395-400.  12/01/14 CXR: IMPRESSION: No active cardiopulmonary disease.  Preoperative labs noted. H/H 10.9/36.3. Cr 1.08. Glucose 135. Reports fasting glucose between 70-80. A1c on 09/03/15 by Dr. Buddy Duty (reported as 5.8, but formal report pending).  If no acute changes then I anticipate that she can proceed as planned. She will get a fasting CBG on arrival.   George Hugh [facility] Short Stay Center/Anesthesiology Phone [phone] 09/06/2015 1:35 PM

## 2015-09-06 NOTE — Progress Notes (Addendum)
PCP: Dr. Leighton Ruff @ [facility]  Endocrinologist: Dr. Louanna Raw: will request HbgA1C results, pt. Reports fasting blood sugars 70-80  Cardiologist: dr. Jari Pigg; will request clearance, notes/ekg/stress/echo  Pt. Had sleep study .5 yrs. Ago and it was negative.  Pulmonlogist: Dr. Lamonte Sakai

## 2015-09-06 NOTE — Pre-Procedure Instructions (Signed)
[name]  09/06/2015      [id number] [place] [address] Phone: [phone] Fax: [phone]    Your procedure is scheduled on Thursday, Jan 12  Report to [facility] Admitting at 5:30 A.M.  Call this number if you have problems the morning of surgery:  [phone]              Any questions prior to surgery call [phone] Monday-Friday 8am-4pm   Remember:  Do not eat food or drink liquids after midnight on  Jan. 11   Take these medicines the morning of surgery with A SIP OF WATER albuterol inhaler if needed-bring to hospital, amlodipine (norvasc), symbicort if needed-bring to hospital, nexium, gabapentin (neurontin), robaxin if needed, metoprolol (lopressor), zanaflex if needed, tramadol if needed,  How to Manage Your Diabetes Before Surgery   Why is it important to control my blood sugar before and after surgery?   Improving blood sugar levels before and after surgery helps healing and can limit problems.  A way of improving blood sugar control is eating a healthy diet by:  - Eating less sugar and carbohydrates  - Increasing activity/exercise  - Talk with your doctor about reaching your blood sugar goals  High blood sugars (greater than 180 mg/dL) can raise your risk of infections and slow down your recovery so you will need to focus on controlling your diabetes during the weeks before surgery.  Make sure that the doctor who takes care of your diabetes knows about your planned surgery including the date and location.  How do I manage my blood sugars before surgery?   Check your blood sugar at least 4 times a day, 2 days before surgery to make sure that they are not too high or low.   Check your blood sugar the morning of your surgery when you wake up and every 2               hours until you get to the Short-Stay unit.  If your blood sugar is less than 70  mg/dL, you will need to treat for low blood sugar by:  Treat a low blood sugar (less than 70 mg/dL) with 1/2 cup of clear juice (cranberry or apple), 4 glucose tablets, OR glucose gel.  Recheck blood sugar in 15 minutes after treatment (to make sure it is greater than 70 mg/dL).  If blood sugar is not greater than 70 mg/dL on re-check, call [phone] for further instructions.   Report your blood sugar to the Short-Stay nurse when you get to Short-Stay.  References:  [employer], 2007 "How to Manage your Diabetes Before and After Surgery".  What do I do about my diabetes medications?     THE NIGHT BEFORE SURGERY, take 42 units of lantis  Insulin.    If your CBG is greater than 220 mg/dL, you may take 1/2 of your sliding scale (correction) dose of insulin.      Do not wear jewelry, make-up or nail polish.  Do not wear lotions, powders, or perfumes.  You may  notwear deodorant.  Do not shave 48 hours prior to surgery.  Men may shave face and neck.  Do not bring valuables to the hospital.  [facility] is not responsible for any belongings or valuables.  Contacts, dentures or bridgework may not be worn into surgery.  Leave your suitcase in the car.  After surgery it may be brought to your room.  For patients admitted to the hospital, discharge time will be determined by your treatment team.  Patients discharged the day of surgery will not be allowed to drive home.   Name and phone number of your driver:   Special instructions: review preparing for surgery  Please read over the following fact sheets that you were given. Pain Booklet, Coughing and Deep Breathing and Surgical Site Infection Prevention

## 2015-09-09 ENCOUNTER — Encounter (HOSPITAL_COMMUNITY)
Admission: RE | Disposition: A | Discharge status: Home / Self Care | Payer: Self-pay | Source: Ambulatory Visit | Attending: Orthopedic Surgery

## 2015-09-09 ENCOUNTER — Encounter (HOSPITAL_COMMUNITY): Payer: Self-pay | Admitting: *Deleted

## 2015-09-09 ENCOUNTER — Ambulatory Visit (HOSPITAL_COMMUNITY): Payer: Medicare Other | Admitting: Vascular Surgery

## 2015-09-09 ENCOUNTER — Ambulatory Visit (HOSPITAL_COMMUNITY)
Admission: RE | Admit: 2015-09-09 | Discharge: 2015-09-09 | Disposition: A | Discharge status: Home / Self Care | Payer: Medicare Other | Source: Ambulatory Visit | Attending: Orthopedic Surgery | Admitting: Orthopedic Surgery

## 2015-09-09 ENCOUNTER — Ambulatory Visit (HOSPITAL_COMMUNITY): Payer: Medicare Other | Admitting: Certified Registered Nurse Anesthetist

## 2015-09-09 DIAGNOSIS — I10 Essential (primary) hypertension: Secondary | ICD-10-CM | POA: Diagnosis not present

## 2015-09-09 DIAGNOSIS — K219 Gastro-esophageal reflux disease without esophagitis: Secondary | ICD-10-CM | POA: Insufficient documentation

## 2015-09-09 DIAGNOSIS — Y831 Surgical operation with implant of artificial internal device as the cause of abnormal reaction of the patient, or of later complication, without mention of misadventure at the time of the procedure: Secondary | ICD-10-CM | POA: Insufficient documentation

## 2015-09-09 DIAGNOSIS — E119 Type 2 diabetes mellitus without complications: Secondary | ICD-10-CM | POA: Diagnosis not present

## 2015-09-09 DIAGNOSIS — J45909 Unspecified asthma, uncomplicated: Secondary | ICD-10-CM | POA: Diagnosis not present

## 2015-09-09 DIAGNOSIS — T85113A Breakdown (mechanical) of implanted electronic neurostimulator, generator, initial encounter: Secondary | ICD-10-CM | POA: Diagnosis not present

## 2015-09-09 DIAGNOSIS — Z7951 Long term (current) use of inhaled steroids: Secondary | ICD-10-CM | POA: Insufficient documentation

## 2015-09-09 DIAGNOSIS — Z794 Long term (current) use of insulin: Secondary | ICD-10-CM | POA: Insufficient documentation

## 2015-09-09 DIAGNOSIS — Z79899 Other long term (current) drug therapy: Secondary | ICD-10-CM | POA: Insufficient documentation

## 2015-09-09 DIAGNOSIS — M1990 Unspecified osteoarthritis, unspecified site: Secondary | ICD-10-CM | POA: Insufficient documentation

## 2015-09-09 DIAGNOSIS — Z792 Long term (current) use of antibiotics: Secondary | ICD-10-CM | POA: Insufficient documentation

## 2015-09-09 HISTORY — PX: SPINAL CORD STIMULATOR BATTERY EXCHANGE: SHX6202

## 2015-09-09 LAB — GLUCOSE, CAPILLARY
GLUCOSE-CAPILLARY: 74 mg/dL (ref 65–99)
GLUCOSE-CAPILLARY: 129 mg/dL — AB (ref 65–99)
Glucose-Capillary: 179 mg/dL — ABNORMAL HIGH (ref 65–99)

## 2015-09-09 SURGERY — SPINAL CORD STIMULATOR BATTERY EXCHANGE
Anesthesia: General | Site: Back

## 2015-09-09 MED ORDER — ONDANSETRON HCL 4 MG/2ML IJ SOLN
INTRAMUSCULAR | Status: AC
  Filled 2015-09-09: qty 2

## 2015-09-09 MED ORDER — EPHEDRINE SULFATE 50 MG/ML IJ SOLN
INTRAMUSCULAR | Status: DC | PRN
  Administered 2015-09-09 (×3): 10 mg via INTRAVENOUS
  Administered 2015-09-09: 20 mg via INTRAVENOUS
  Administered 2015-09-09: 10 mg via INTRAVENOUS

## 2015-09-09 MED ORDER — PROPOFOL 10 MG/ML IV BOLUS
INTRAVENOUS | Status: AC
  Filled 2015-09-09: qty 20

## 2015-09-09 MED ORDER — SCOPOLAMINE 1 MG/3DAYS TD PT72
MEDICATED_PATCH | TRANSDERMAL | Status: AC
  Administered 2015-09-09: 1.5 mg
  Filled 2015-09-09: qty 1

## 2015-09-09 MED ORDER — PHENYLEPHRINE HCL 10 MG/ML IJ SOLN
10.0000 mg | INTRAVENOUS | Status: DC | PRN
  Administered 2015-09-09: 75 ug/min via INTRAVENOUS

## 2015-09-09 MED ORDER — PROPOFOL 500 MG/50ML IV EMUL
INTRAVENOUS | Status: DC | PRN
  Administered 2015-09-09: 25 ug/kg/min via INTRAVENOUS

## 2015-09-09 MED ORDER — CEFAZOLIN SODIUM-DEXTROSE 2-3 GM-% IV SOLR
INTRAVENOUS | Status: AC
  Filled 2015-09-09: qty 50

## 2015-09-09 MED ORDER — ROCURONIUM BROMIDE 50 MG/5ML IV SOLN
INTRAVENOUS | Status: AC
  Filled 2015-09-09: qty 1

## 2015-09-09 MED ORDER — ARTIFICIAL TEARS OP OINT
TOPICAL_OINTMENT | OPHTHALMIC | Status: AC
  Filled 2015-09-09: qty 3.5

## 2015-09-09 MED ORDER — SCOPOLAMINE 1 MG/3DAYS TD PT72: 1.0000 | MEDICATED_PATCH | TRANSDERMAL | Status: DC

## 2015-09-09 MED ORDER — DEXTROSE 50 % IV SOLN
INTRAVENOUS | Status: AC
  Filled 2015-09-09: qty 50

## 2015-09-09 MED ORDER — MIDAZOLAM HCL 5 MG/5ML IJ SOLN
INTRAMUSCULAR | Status: DC | PRN
  Administered 2015-09-09: 1 mg via INTRAVENOUS

## 2015-09-09 MED ORDER — SUCCINYLCHOLINE CHLORIDE 20 MG/ML IJ SOLN
INTRAMUSCULAR | Status: AC
  Filled 2015-09-09: qty 1

## 2015-09-09 MED ORDER — FENTANYL CITRATE (PF) 250 MCG/5ML IJ SOLN
INTRAMUSCULAR | Status: AC
  Filled 2015-09-09: qty 5

## 2015-09-09 MED ORDER — HYDROMORPHONE HCL 1 MG/ML IJ SOLN: 0.2500 mg | INTRAMUSCULAR | Status: DC | PRN

## 2015-09-09 MED ORDER — BUPIVACAINE-EPINEPHRINE (PF) 0.25% -1:200000 IJ SOLN
INTRAMUSCULAR | Status: AC
  Filled 2015-09-09: qty 30

## 2015-09-09 MED ORDER — GLYCOPYRROLATE 0.2 MG/ML IJ SOLN
INTRAMUSCULAR | Status: AC
  Filled 2015-09-09: qty 1

## 2015-09-09 MED ORDER — BUPIVACAINE-EPINEPHRINE 0.25% -1:200000 IJ SOLN
INTRAMUSCULAR | Status: DC | PRN
  Administered 2015-09-09: 20 mL

## 2015-09-09 MED ORDER — METHOCARBAMOL 500 MG PO TABS: 500.0000 mg | ORAL_TABLET | Freq: Four times a day (QID) | ORAL | Status: DC | PRN

## 2015-09-09 MED ORDER — ONDANSETRON HCL 4 MG PO TABS: 4.0000 mg | ORAL_TABLET | Freq: Three times a day (TID) | ORAL | Status: DC | PRN

## 2015-09-09 MED ORDER — PROPOFOL 10 MG/ML IV BOLUS
INTRAVENOUS | Status: DC | PRN
  Administered 2015-09-09: 175 mg via INTRAVENOUS

## 2015-09-09 MED ORDER — SUCCINYLCHOLINE CHLORIDE 20 MG/ML IJ SOLN
INTRAMUSCULAR | Status: DC | PRN
  Administered 2015-09-09: 125 mg via INTRAVENOUS

## 2015-09-09 MED ORDER — LACTATED RINGERS IV SOLN
INTRAVENOUS | Status: DC
  Administered 2015-09-09 (×2): via INTRAVENOUS

## 2015-09-09 MED ORDER — PHENYLEPHRINE HCL 10 MG/ML IJ SOLN
INTRAMUSCULAR | Status: DC | PRN
  Administered 2015-09-09: 120 ug via INTRAVENOUS
  Administered 2015-09-09: 80 ug via INTRAVENOUS
  Administered 2015-09-09: 120 ug via INTRAVENOUS
  Administered 2015-09-09: 80 ug via INTRAVENOUS

## 2015-09-09 MED ORDER — PROMETHAZINE HCL 25 MG/ML IJ SOLN: 6.2500 mg | INTRAMUSCULAR | Status: DC | PRN

## 2015-09-09 MED ORDER — LIDOCAINE HCL (CARDIAC) 20 MG/ML IV SOLN
INTRAVENOUS | Status: DC | PRN
  Administered 2015-09-09: 70 mg via INTRAVENOUS

## 2015-09-09 MED ORDER — ONDANSETRON HCL 4 MG/2ML IJ SOLN
INTRAMUSCULAR | Status: DC | PRN
  Administered 2015-09-09: 4 mg via INTRAVENOUS

## 2015-09-09 MED ORDER — METHOCARBAMOL 1000 MG/10ML IJ SOLN: 500.0000 mg | Freq: Four times a day (QID) | INTRAVENOUS | Status: DC | PRN

## 2015-09-09 MED ORDER — METHOCARBAMOL 500 MG PO TABS: 500.0000 mg | ORAL_TABLET | Freq: Three times a day (TID) | ORAL | Status: DC | PRN

## 2015-09-09 MED ORDER — MIDAZOLAM HCL 2 MG/2ML IJ SOLN
INTRAMUSCULAR | Status: AC
  Filled 2015-09-09: qty 2

## 2015-09-09 MED ORDER — DEXTROSE 50 % IV SOLN
1.0000 | Freq: Once | INTRAVENOUS | Status: AC
  Administered 2015-09-09: 50 mL via INTRAVENOUS
  Filled 2015-09-09: qty 50

## 2015-09-09 MED ORDER — FENTANYL CITRATE (PF) 100 MCG/2ML IJ SOLN
INTRAMUSCULAR | Status: DC | PRN
  Administered 2015-09-09: 100 ug via INTRAVENOUS

## 2015-09-09 MED ORDER — SCOPOLAMINE 1 MG/3DAYS TD PT72
1.0000 | MEDICATED_PATCH | TRANSDERMAL | Status: DC
  Administered 2015-09-09: 1.5 mg via TRANSDERMAL

## 2015-09-09 MED ORDER — LIDOCAINE HCL (CARDIAC) 20 MG/ML IV SOLN
INTRAVENOUS | Status: AC
Start: 2015-09-09 — End: 2015-09-09
  Filled 2015-09-09: qty 5

## 2015-09-09 MED ORDER — 0.9 % SODIUM CHLORIDE (POUR BTL) OPTIME
TOPICAL | Status: DC | PRN
  Administered 2015-09-09: 1000 mL

## 2015-09-09 MED ORDER — OXYCODONE-ACETAMINOPHEN 10-325 MG PO TABS: 1.0000 | ORAL_TABLET | ORAL | Status: DC | PRN

## 2015-09-09 MED ORDER — CEFAZOLIN SODIUM-DEXTROSE 2-3 GM-% IV SOLR
2.0000 g | INTRAVENOUS | Status: AC
  Administered 2015-09-09: 2 g via INTRAVENOUS

## 2015-09-09 SURGICAL SUPPLY — 61 items
CANISTER SUCTION 2500CC (MISCELLANEOUS) ×3 IMPLANT
CLOSURE STERI-STRIP 1/2X4 (GAUZE/BANDAGES/DRESSINGS) ×1
CLOSURE WOUND 1/2 X4 (GAUZE/BANDAGES/DRESSINGS) ×1
CLSR STERI-STRIP ANTIMIC 1/2X4 (GAUZE/BANDAGES/DRESSINGS) ×2 IMPLANT
CORDS BIPOLAR (ELECTRODE) ×3 IMPLANT
DRAPE C-ARM 42X72 X-RAY (DRAPES) IMPLANT
DRAPE C-ARMOR (DRAPES) IMPLANT
DRAPE INCISE IOBAN 66X45 STRL (DRAPES) ×3 IMPLANT
DRAPE PED LAPAROTOMY (DRAPES) ×3 IMPLANT
DRAPE SURG 17X23 STRL (DRAPES) ×3 IMPLANT
DRAPE U-SHAPE 47X51 STRL (DRAPES) ×6 IMPLANT
DRSG MEPILEX BORDER 4X4 (GAUZE/BANDAGES/DRESSINGS) IMPLANT
DRSG MEPILEX BORDER 4X8 (GAUZE/BANDAGES/DRESSINGS) ×3 IMPLANT
DURAPREP 26ML APPLICATOR (WOUND CARE) ×3 IMPLANT
ELECT BLADE 4.0 EZ CLEAN MEGAD (MISCELLANEOUS) ×3
ELECT PENCIL ROCKER SW 15FT (MISCELLANEOUS) ×6 IMPLANT
ELECT REM PT RETURN 9FT ADLT (ELECTROSURGICAL) ×3
ELECTRODE BLDE 4.0 EZ CLN MEGD (MISCELLANEOUS) ×1 IMPLANT
ELECTRODE REM PT RTRN 9FT ADLT (ELECTROSURGICAL) ×1 IMPLANT
GENERATOR IPG SCS EIFU 7.5 (Neuro Prosthesis/Implant) ×3 IMPLANT
GLOVE BIO SURGEON STRL SZ 6.5 (GLOVE) ×2 IMPLANT
GLOVE BIO SURGEONS STRL SZ 6.5 (GLOVE) ×1
GLOVE BIOGEL PI IND STRL 6.5 (GLOVE) ×1 IMPLANT
GLOVE BIOGEL PI IND STRL 8.5 (GLOVE) ×1 IMPLANT
GLOVE BIOGEL PI INDICATOR 6.5 (GLOVE) ×2
GLOVE BIOGEL PI INDICATOR 8.5 (GLOVE) ×2
GLOVE SS BIOGEL STRL SZ 8.5 (GLOVE) ×2 IMPLANT
GLOVE SUPERSENSE BIOGEL SZ 8.5 (GLOVE) ×4
GOWN STRL REUS W/ TWL LRG LVL3 (GOWN DISPOSABLE) ×2 IMPLANT
GOWN STRL REUS W/TWL 2XL LVL3 (GOWN DISPOSABLE) ×3 IMPLANT
GOWN STRL REUS W/TWL LRG LVL3 (GOWN DISPOSABLE) ×4
KIT BASIN OR (CUSTOM PROCEDURE TRAY) ×3 IMPLANT
KIT ROOM TURNOVER OR (KITS) ×3 IMPLANT
NDL SUT 6 .5 CRC .975X.05 MAYO (NEEDLE) ×1 IMPLANT
NEEDLE 22X1 1/2 (OR ONLY) (NEEDLE) IMPLANT
NEEDLE MAYO TAPER (NEEDLE) ×2
NEEDLE SPNL 18GX3.5 QUINCKE PK (NEEDLE) IMPLANT
NS IRRIG 1000ML POUR BTL (IV SOLUTION) ×3 IMPLANT
PACK GENERAL/GYN (CUSTOM PROCEDURE TRAY) ×3 IMPLANT
PACK UNIVERSAL I (CUSTOM PROCEDURE TRAY) ×3 IMPLANT
PAD ARMBOARD 7.5X6 YLW CONV (MISCELLANEOUS) ×6 IMPLANT
PATTIES SURGICAL .5 X.5 (GAUZE/BANDAGES/DRESSINGS) IMPLANT
SPONGE LAP 4X18 X RAY DECT (DISPOSABLE) ×3 IMPLANT
STAPLER VISISTAT 35W (STAPLE) IMPLANT
STRIP CLOSURE SKIN 1/2X4 (GAUZE/BANDAGES/DRESSINGS) ×2 IMPLANT
SURGIFLO W/THROMBIN 8M KIT (HEMOSTASIS) IMPLANT
SUT BONE WAX W31G (SUTURE) ×3 IMPLANT
SUT FIBERWIRE #2 38 T-5 BLUE (SUTURE)
SUT MON AB 3-0 SH 27 (SUTURE) ×2
SUT MON AB 3-0 SH27 (SUTURE) ×1 IMPLANT
SUT VIC AB 1 CT1 18XCR BRD 8 (SUTURE) ×1 IMPLANT
SUT VIC AB 1 CT1 27 (SUTURE)
SUT VIC AB 1 CT1 27XBRD ANBCTR (SUTURE) IMPLANT
SUT VIC AB 1 CT1 8-18 (SUTURE) ×2
SUT VIC AB 2-0 CT1 18 (SUTURE) ×3 IMPLANT
SUTURE FIBERWR #2 38 T-5 BLUE (SUTURE) IMPLANT
SYR BULB IRRIGATION 50ML (SYRINGE) ×3 IMPLANT
SYR CONTROL 10ML LL (SYRINGE) ×3 IMPLANT
TOWEL OR 17X24 6PK STRL BLUE (TOWEL DISPOSABLE) ×6 IMPLANT
TOWEL OR 17X26 10 PK STRL BLUE (TOWEL DISPOSABLE) ×3 IMPLANT
WATER STERILE IRR 1000ML POUR (IV SOLUTION) IMPLANT

## 2015-09-09 NOTE — Op Note (Signed)
NAMEKIESHIA, GALLIS              ACCOUNT NO.:  [id number]  MEDICAL RECORD NO.:  [id number]  LOCATION:  [facility]                         FACILITY:  [facility]  PHYSICIAN:  Renelle Stegenga D. Rolena Infante, M.D. DATE OF BIRTH:  [birth year]  DATE OF PROCEDURE:  09/09/2015 DATE OF DISCHARGE:                              OPERATIVE REPORT   PREOPERATIVE DIAGNOSIS:  Failed spinal cord stimulator battery.  POSTOPERATIVE DIAGNOSIS:  Failed spinal cord stimulator battery.  OPERATIVE PROCEDURE:  Explantation of spinal cord stimulator battery and implant a new Proclaim 7 burst battery.  COMPLICATIONS:  None.  CONDITION:  Stable.  HISTORY:  This is a very pleasant 75 year old woman who has had a spinal cord stimulator battery placed for several years now.  It is no longer functioning and her pain level is increasing.  After discussing treatment options, we elected to proceed with revision of the stimulator battery.  All appropriate risks, benefits, and alternatives were discussed with the patient.  Consent was obtained.  OPERATIVE NOTE:  The patient was brought to the operating room, placed supine on the operating table.  After successful induction of general anesthesia and endotracheal intubation, she was turned prone onto the Wilson frame.  All bony prominences were well padded and the back was prepped and draped in a standard fashion.  Time-out was taken confirming patient, procedure, and all other pertinent important data.  Once this was completed, the back was prepped and draped in a standard fashion. We anesthetized the incision site and battery area with a total of 20 mL of 0.25% Marcaine with epi.  The previous incision was re-incised and I bluntly dissected down to bed level of the battery.  I removed the battery and disconnected the leads.  The new battery was opened.  The leads were connected and tested.  Once they were noted to be satisfactory, I torqued the locking nut according to  manufacture's standards.  I then returned the battery to the cavity and sutured it to the deep fascia with interrupted #1 Vicryl sutures.  The wound was then irrigated copiously with normal saline and I closed in a layered fashion with interrupted #1 Vicryl sutures, interrupted 2-0 Vicryl sutures, and a 3-0 Monocryl.  Steri-Strips and a dry dressing were applied.  The patient was extubated, transferred to PACU without incident.  At the end of the case, all needle and sponge counts were correct.  There were no adverse intraoperative events.  PLAN:  The patient will be discharged from PACU to home.  She will follow up with me in 2 weeks for wound re-evaluation.  She was also given a prescription for Percocet for pain control 5/325 1 q.6 p.r.n., Robaxin 1 p.o. t.i.d. for muscle spasms, and Zofran 1 q.8 for nausea.     Monserath Neff D. Rolena Infante, M.D.     DDB/MEDQ  D:  09/09/2015  T:  09/09/2015  Job:  [id number]  cc:   Duane Lope D. Rolena Infante, M.D.'s Office

## 2015-09-09 NOTE — Progress Notes (Signed)
Pt. Stated that she felt weak and dizzy on arrival. Blood glucose was 110 @ home and then dropped to 100.CBG checked on arrival  34.  Dr. Oletta Lamas contacted and orders received.

## 2015-09-09 NOTE — Anesthesia Preprocedure Evaluation (Addendum)
Anesthesia Evaluation  Patient identified by MRN, date of birth, ID band Patient awake    Reviewed: Allergy & Precautions, NPO status , Patient's Chart, lab work & pertinent test results  History of Anesthesia Complications (+) PONV and history of anesthetic complications  Airway Mallampati: II  TM Distance: >3 FB Neck ROM: Full    Dental  (+) Teeth Intact, Dental Advisory Given   Pulmonary asthma ,    Pulmonary exam normal        Cardiovascular hypertension, Pt. on medications Normal cardiovascular exam Rhythm:Regular Rate:Normal  12/24/14 Exercise Nuclear stress test (Dr. Wynonia Lawman): Impression: 1. Normal stress Myoview study with no evidence of ischemia or infarction. 2. Normal quantitative gated SPECT EF of 72% with normal wall motion.  3. Reduced exercise tolerance for age.  Recommendations: This study shows no evidence of myocardial ischemia. Her exercise capacity severely reduced however that limits the prognostic value of the test.  12/21/14 Echo (Dr. Wynonia Lawman): Conclusion: 1. Mild concentric LVH with normal LV systolic function. Estimated EF 55%. 2. Mild left atrial enlargement. 3. Mild mitral and tricuspid regurgitation. 01/08/15 PFTs: FVC 1.77 (62% pre, 70% post BD), FEV1 1.27 (59% pre, 67% post BD), DLCOunc 17.66 (72%).    Neuro/Psych PSYCHIATRIC DISORDERS Depression    GI/Hepatic Neg liver ROS, GERD  ,  Endo/Other  diabetes  Renal/GU negative Renal ROS     Musculoskeletal  (+) Arthritis ,   Abdominal   Peds  Hematology   Anesthesia Other Findings   Reproductive/Obstetrics                          Anesthesia Physical Anesthesia Plan  ASA: III  Anesthesia Plan: General   Post-op Pain Management:    Induction: Intravenous  Airway Management Planned: Oral ETT  Additional Equipment:   Intra-op Plan:   Post-operative Plan: Extubation in OR  Informed Consent: I have  reviewed the patients History and Physical, chart, labs and discussed the procedure including the risks, benefits and alternatives for the proposed anesthesia with the patient or authorized representative who has indicated his/her understanding and acceptance.   Dental advisory given  Plan Discussed with: CRNA, Anesthesiologist and Surgeon  Anesthesia Plan Comments:        Anesthesia Quick Evaluation

## 2015-09-09 NOTE — Transfer of Care (Signed)
Immediate Anesthesia Transfer of Care Note  Patient: [name]  Procedure(s) Performed: Procedure(s): SPINAL CORD STIMULATOR BATTERY EXCHANGE (N/A)  Patient Location: PACU  Anesthesia Type:General  Level of Consciousness: awake, oriented and patient cooperative  Airway & Oxygen Therapy: Patient Spontanous Breathing and Patient connected to face mask oxygen  Post-op Assessment: Report given to RN, Post -op Vital signs reviewed and stable and Patient moving all extremities X 4  Post vital signs: Reviewed and stable  Last Vitals:  Filed Vitals:   09/09/15 0603 09/09/15 0834  BP: 122/48   Pulse: 66   Temp: 36.6 C 36.5 C  Resp: 18     Complications: No apparent anesthesia complications

## 2015-09-09 NOTE — Anesthesia Postprocedure Evaluation (Signed)
Anesthesia Post Note  Patient: [name]  Procedure(s) Performed: Procedure(s) (LRB): SPINAL CORD STIMULATOR BATTERY EXCHANGE (N/A)  Patient location during evaluation: PACU Anesthesia Type: General Level of consciousness: sedated Pain management: pain level controlled Vital Signs Assessment: post-procedure vital signs reviewed and stable Respiratory status: spontaneous breathing and respiratory function stable Cardiovascular status: stable Anesthetic complications: no    Last Vitals:  Filed Vitals:   09/09/15 0900 09/09/15 0915  BP: 129/62 136/52  Pulse: 84 84  Temp:    Resp: 19 15    Last Pain:  Filed Vitals:   09/09/15 0920  PainSc: 0-No pain                 Rakisha Pincock DANIEL

## 2015-09-09 NOTE — Brief Op Note (Signed)
09/09/2015  8:30 AM  PATIENT:  [name]  75 y.o. female  PRE-OPERATIVE DIAGNOSIS:  SPINAL CORD STIMULATOR BATTERY FAILURE  POST-OPERATIVE DIAGNOSIS:  SPINAL CORD STIMULATOR BATTERY FAILURE  PROCEDURE:  Procedure(s): SPINAL CORD STIMULATOR BATTERY EXCHANGE (N/A)  SURGEON:  Surgeon(s) and Role:    * Melina Schools, MD - Primary  PHYSICIAN ASSISTANT:   ASSISTANTS: none   ANESTHESIA:   general  EBL:     BLOOD ADMINISTERED:none  DRAINS: none   LOCAL MEDICATIONS USED:  MARCAINE     SPECIMEN:  No Specimen  DISPOSITION OF SPECIMEN:  PATHOLOGY  COUNTS:  YES  TOURNIQUET:  * No tourniquets in log *  DICTATION: .Other Dictation: Dictation Number [id number]  PLAN OF CARE: Discharge to home after PACU  PATIENT DISPOSITION:  PACU - hemodynamically stable.

## 2015-09-09 NOTE — H&P (Signed)
History of Present Illness The patient is a 75 year old female who presents today for follow up of their neck. The patient is being followed for their LBP. This was previously controlled with her Spinal Cord Stimulator. The battery has been interigated and is non functional. Her pain has increased significantly since this failure. The pt is here to discuss replacement battery for spinal cord stimulator.   Allergies CODEINE 02/14/2002 PREVACID 02/14/2002 VASOTEC 02/14/2002  Family History Cancer  mother and grandmother fathers side Osteoarthritis  mother and grandmother fathers side Diabetes Mellitus  grandfather fathers side Hypertension  father  Social History Tobacco use  never smoker Pain Contract  no Current work status  retired Careers information officer  2 Living situation  live with spouse Exercise  Exercises weekly; does running / walking Number of flights of stairs before winded  1 Alcohol use  never consumed alcohol Marital status  married  Medication History Iron (90 (18 Fe)MG Tablet, Oral) Active. ("it's a special kind of IRON" 150 mg qd) AmLODIPine Besylate (10MG  Tablet, Oral) Active. HumaLOG KwikPen (100UNIT/ML Solution, Subcutaneous) Active. Livalo (2MG  Tablet, Oral) Active. Amoxicillin (500MG  Capsule, Oral) Active. BD Pen Needle Nano U/F (32G X 4 MM Misc,) Active. BD Pen Needle Short U/F (31G X 8 MM Misc,) Active. Esomeprazole Magnesium (40MG  Capsule DR, Oral) Active. Triamterene-HCTZ (37.5-25MG  Tablet, Oral) Active. Triamterene-HCTZ (37.5-25MG  Capsule, Oral) Active. ProAir HFA (108 (90 Base)MCG/ACT Aerosol Soln, Inhalation) Active. BD Insulin Syringe Ultrafine (31G X 5/16"1 ML Misc,) Active. Symbicort (160-4.5MCG/ACT Aerosol, Inhalation) Active. Gabapentin (300MG  Capsule, Oral) Active. Amitriptyline HCl (50MG  Tablet, Oral) Active. Lantus (100UNIT/ML Solution, Subcutaneous) Active. Losartan Potassium (100MG  Tablet, Oral) Active. Metoprolol  Tartrate (50MG  Tablet, Oral) Active. Medications Reconciled   Physical Exam General General Appearance-Not in acute distress. Orientation-Oriented X3. Build & Nutrition-Well nourished and Well developed.  Integumentary General Characteristics Surgical Scars - surgical scarring consistent with previous lumbar surgery. Lumbar Spine-Skin examination of the lumbar spine is without deformity, skin lesions, lacerations or abrasions.  Chest and Lung Exam Auscultation Breath sounds - Normal and Clear.  Cardiovascular Auscultation Rhythm - Regular rate and rhythm.  Abdomen Palpation/Percussion Palpation and Percussion of the abdomen reveal - Soft, Non Tender and No Rebound tenderness.  Peripheral Vascular Lower Extremity Palpation - Posterior tibial pulse - Bilateral - 2+. Dorsalis pedis pulse - Bilateral - 2+.  Neurologic Sensation Lower Extremity - Bilateral - sensation is intact in the lower extremity. Reflexes Patellar Reflex - Bilateral - 2+. Achilles Reflex - Bilateral - 2+. Clonus - Bilateral - clonus not present. Hoffman's Sign - Bilateral - Hoffman's sign not present. Testing Seated Straight Leg Raise - Bilateral - Seated straight leg raise negative.  Musculoskeletal Spine/Ribs/Pelvis  Lumbosacral Spine: Inspection and Palpation - Tenderness - left lumbar paraspinals tender to palpation and right lumbar paraspinals tender to palpation. Strength and Tone: Strength - Hip Flexion - Bilateral - 5/5. Knee Extension - Bilateral - 5/5. Knee Flexion - Bilateral - 5/5. Ankle Dorsiflexion - Bilateral - 5/5. Ankle Plantarflexion - Bilateral - 5/5. Heel walk - Bilateral - able to heel walk with mild difficulty. Toe Walk - Bilateral - able to walk on toes with mild difficulty. Heel-Toe Walk - Bilateral - able to heel-toe walk with mild difficulty. ROM - Flexion - moderately decreased range of motion and painful. Extension - moderately decreased range of motion and painful.  Left Lateral Bending - moderately decreased range of motion and painful. Right Lateral Bending - moderately decreased range of motion and painful. Right Rotation - moderately decreased  range of motion and painful. Left Rotation - moderately decreased range of motion and painful. Pain - . Lumbosacral Spine - Waddell's Signs - no Waddell's signs present. Lower Extremity Range of Motion - No true hip, knee or ankle pain with range of motion. Gait and Station - [employer] - cane.  Assessments Transcription She has an MRI dated 08/16/2015 which shows a broad based disc herniation more prominent to the right at L3-4. No significant canal stenosis is noted. There is also some mild degenerative disc disease at that level 3-4 as well.   Patients LBP had improved in the past with the use of a SCS.  She presents today for removal and re-insertion of a new battery.  All risks and benefits  Where explained.  Infection, bleeding, nerve damage, damage to leads, need for further surgery, death, stroke ,paralysis.

## 2015-09-10 ENCOUNTER — Encounter (HOSPITAL_COMMUNITY): Payer: Self-pay | Admitting: Orthopedic Surgery

## 2015-10-25 ENCOUNTER — Other Ambulatory Visit: Payer: Self-pay

## 2015-10-25 DIAGNOSIS — Z1231 Encounter for screening mammogram for malignant neoplasm of breast: Secondary | ICD-10-CM

## 2015-11-10 ENCOUNTER — Ambulatory Visit
Admission: RE | Admit: 2015-11-10 | Discharge: 2015-11-10 | Disposition: A | Discharge status: Home / Self Care | Payer: Medicare Other | Source: Ambulatory Visit

## 2015-11-10 DIAGNOSIS — Z1231 Encounter for screening mammogram for malignant neoplasm of breast: Secondary | ICD-10-CM

## 2015-11-26 ENCOUNTER — Encounter: Payer: Self-pay | Admitting: Oncology

## 2015-11-26 ENCOUNTER — Telehealth: Payer: Self-pay | Admitting: Oncology

## 2015-11-26 NOTE — Telephone Encounter (Signed)
Verified address and insurance, called and faxed referring office, mailed new pt packet, scheduled intake

## 2015-12-13 ENCOUNTER — Other Ambulatory Visit: Payer: Self-pay | Admitting: Orthopedic Surgery

## 2015-12-13 DIAGNOSIS — M542 Cervicalgia: Secondary | ICD-10-CM

## 2015-12-15 ENCOUNTER — Ambulatory Visit (HOSPITAL_BASED_OUTPATIENT_CLINIC_OR_DEPARTMENT_OTHER): Payer: Medicare Other | Admitting: Oncology

## 2015-12-15 ENCOUNTER — Telehealth: Payer: Self-pay | Admitting: *Deleted

## 2015-12-15 ENCOUNTER — Telehealth: Payer: Self-pay | Admitting: Oncology

## 2015-12-15 ENCOUNTER — Ambulatory Visit (HOSPITAL_BASED_OUTPATIENT_CLINIC_OR_DEPARTMENT_OTHER): Payer: Medicare Other

## 2015-12-15 VITALS — BP 137/53 | HR 75 | Temp 98.1°F | Resp 18 | Ht 64.5 in | Wt 165.5 lb

## 2015-12-15 DIAGNOSIS — R109 Unspecified abdominal pain: Secondary | ICD-10-CM | POA: Diagnosis not present

## 2015-12-15 DIAGNOSIS — D509 Iron deficiency anemia, unspecified: Secondary | ICD-10-CM | POA: Diagnosis not present

## 2015-12-15 DIAGNOSIS — I10 Essential (primary) hypertension: Secondary | ICD-10-CM

## 2015-12-15 DIAGNOSIS — E119 Type 2 diabetes mellitus without complications: Secondary | ICD-10-CM

## 2015-12-15 DIAGNOSIS — N189 Chronic kidney disease, unspecified: Secondary | ICD-10-CM | POA: Diagnosis not present

## 2015-12-15 DIAGNOSIS — Z801 Family history of malignant neoplasm of trachea, bronchus and lung: Secondary | ICD-10-CM

## 2015-12-15 LAB — CBC WITH DIFFERENTIAL/PLATELET
BASO%: 0.7 % (ref 0.0–2.0)
BASOS ABS: 0.1 10*3/uL (ref 0.0–0.1)
EOS%: 1.9 % (ref 0.0–7.0)
Eosinophils Absolute: 0.2 10*3/uL (ref 0.0–0.5)
HCT: 30.8 % — ABNORMAL LOW (ref 34.8–46.6)
HEMOGLOBIN: 9.5 g/dL — AB (ref 11.6–15.9)
LYMPH%: 19.8 % (ref 14.0–49.7)
MCH: 24.1 pg — AB (ref 25.1–34.0)
MCHC: 30.7 g/dL — AB (ref 31.5–36.0)
MCV: 78.6 fL — ABNORMAL LOW (ref 79.5–101.0)
MONO#: 0.9 10*3/uL (ref 0.1–0.9)
MONO%: 9.6 % (ref 0.0–14.0)
NEUT#: 6.6 10*3/uL — ABNORMAL HIGH (ref 1.5–6.5)
NEUT%: 68 % (ref 38.4–76.8)
Platelets: 308 10*3/uL (ref 145–400)
RBC: 3.92 10*6/uL (ref 3.70–5.45)
RDW: 16.8 % — ABNORMAL HIGH (ref 11.2–14.5)
WBC: 9.7 10*3/uL (ref 3.9–10.3)
lymph#: 1.9 10*3/uL (ref 0.9–3.3)

## 2015-12-15 LAB — IRON AND TIBC
%SAT: 6 % — ABNORMAL LOW (ref 21–57)
IRON: 30 ug/dL — AB (ref 41–142)
TIBC: 465 ug/dL — AB (ref 236–444)
UIBC: 435 ug/dL — ABNORMAL HIGH (ref 120–384)

## 2015-12-15 LAB — FERRITIN: FERRITIN: 8 ng/mL — AB (ref 9–269)

## 2015-12-15 NOTE — Telephone Encounter (Signed)
per pof to sch pt appt-MW sch fera-sent back to lab per pof

## 2015-12-15 NOTE — Telephone Encounter (Signed)
Per staff message and POF I have scheduled appts. Advised scheduler of appts. JMW  

## 2015-12-15 NOTE — Progress Notes (Signed)
Please see consult note.  

## 2015-12-15 NOTE — Consult Note (Signed)
Reason for Referral: Anemia.   HPI: 75 year old woman currently of [name] area where she lived the majority of her life. She is a pleasant woman with history of diabetes, hypertension as well as chronic renal insufficiency. She was noted to have iron deficiency anemia dating back at least 2015. At that time her iron level was 45 with ferritin of 10.7. She has been intermittently on oral iron supplements although she has tolerated it poorly. She reports constipation and dyspepsia associated with it. Most recently her hemoglobin was 10.1 in March 2017. Her MCV was low at 80.6 with an elevated RDW of 15.7. She had normal white cell count and platelet count. Her white cell count differential was normal. Her creatinine clearance was around 47 mL/m. Her ferritin continued to be low at 7.2 with iron level of 27 which is also low. She has been prescribed as mentioned polysaccharide iron complex of the 150 mg capsule but she admits not taking it. She reports symptoms of fatigue and weakness but no chest pain or dyspnea on exertion. She has reported abdominal pain but no change in her bowel habits. She denied any hematochezia or melena and she is up-to-date on her colonoscopy. She denied any dyspepsia or hemoptysis. Denied any genitourinary bleeding. She denied any other constitutional symptoms such as weight loss or change in her appetite. She continues to attend to her activities of daily living without any decline.  She does not report any headaches, blurry vision, syncope or seizures. She does not report any fevers, chills or sweats. She does not report any chest pain, palpitation, orthopnea or leg edema. She does not report any cough, wheezing or hemoptysis. She does not report any nausea, vomiting she does report constipation and occasional diarrhea. She does not report any gross satiety or abdominal distention. She does report abdominal pain at times. She does not report any skeletal complaints of arthralgias  or myalgias. She does not report any frequency urgency or hematuria. She does not report any lymphadenopathy or petechiae. Remaining review of systems unremarkable.   Past Medical History  Diagnosis Date  . Diabetes mellitus   . Hypertension   . Asthma   . S/P appy   . PONV (postoperative nausea and vomiting)   . Heart murmur   . GERD (gastroesophageal reflux disease)   . Arthritis   :  Past Surgical History  Procedure Laterality Date  . Appendectomy  1952  . Vaginal hysterectomy  1977  . Knee arthroscopy  2003 2005    right knee  . Mass fallopian tube    . Incontinence surgery    . Joint replacement Right   . Hand surgery Left 2010    operated on index and ring finger  . Spinal cord stimulator battery exchange N/A 09/09/2015    Procedure: SPINAL CORD STIMULATOR BATTERY EXCHANGE;  Surgeon: Melina Schools, MD;  Location: [facility];  Service: Orthopedics;  Laterality: N/A;  :   Current outpatient prescriptions:  .  albuterol (PROAIR HFA) 108 (90 BASE) MCG/ACT inhaler, Inhale 2 puffs into the lungs every 4 (four) hours as needed for wheezing or shortness of breath., Disp: 1 Inhaler, Rfl: 3 .  albuterol (PROVENTIL) (2.5 MG/3ML) 0.083% nebulizer solution, Take 3 mLs (2.5 mg total) by nebulization every 6 (six) hours as needed for wheezing or shortness of breath., Disp: 75 mL, Rfl: 12 .  amitriptyline (ELAVIL) 50 MG tablet, Take 50 mg by mouth at bedtime., Disp: , Rfl:  .  amLODipine (NORVASC) 10 MG tablet,  Take 10 mg by mouth daily., Disp: , Rfl:  .  budesonide-formoterol (SYMBICORT) 160-4.5 MCG/ACT inhaler, Inhale 2 puffs into the lungs 2 (two) times daily. (Patient taking differently: Inhale 2 puffs into the lungs 2 (two) times daily as needed (Wheezing, cough). ), Disp: 1 Inhaler, Rfl: 6 .  esomeprazole (NEXIUM) 40 MG capsule, Take 40 mg by mouth daily., Disp: , Rfl: 0 .  gabapentin (NEURONTIN) 300 MG capsule, Take 300 mg by mouth 4 (four) times daily. , Disp: , Rfl:  .   guaiFENesin-dextromethorphan (ROBITUSSIN DM) 100-10 MG/5ML syrup, Take 5 mLs by mouth every 4 (four) hours as needed for cough., Disp: 118 mL, Rfl: 1 .  Insulin Glargine (LANTUS SOLOSTAR [place]), Inject 52 Units into the skin at bedtime. , Disp: , Rfl:  .  insulin lispro (HUMALOG) 100 UNIT/ML SOCT, Inject 22-28 Units into the skin 3 (three) times daily with meals. Sliding scale, Disp: , Rfl:  .  losartan (COZAAR) 100 MG tablet, Take 100 mg by mouth daily., Disp: , Rfl:  .  methocarbamol (ROBAXIN) 500 MG tablet, Take 1 tablet (500 mg total) by mouth 3 (three) times daily as needed for muscle spasms., Disp: 60 tablet, Rfl: 0 .  metoprolol (LOPRESSOR) 50 MG tablet, Take 75 mg by mouth 2 (two) times daily., Disp: , Rfl:  .  Multiple Vitamins-Minerals (PRESERVISION AREDS 2 PO), Take 1 tablet by mouth 2 (two) times daily. , Disp: , Rfl:  .  triamterene-hydrochlorothiazide (DYAZIDE) 37.5-25 MG per capsule, Take 2 capsules by mouth daily after lunch. , Disp: , Rfl: :  Allergies  Allergen Reactions  . Vasotec Shortness Of Breath and Other (See Comments)    wheezing  . Codeine Nausea And Vomiting  . Lansoprazole Itching, Rash and Other (See Comments)    redness  . Morphine And Related Itching  . Sulfate Itching  :  Family History  Problem Relation Age of Onset  . Colon cancer Neg Hx   . Lung cancer Mother   . Heart attack Father   :  Social History   Social History  . Marital Status: Married    Spouse Name: N/A  . Number of Children: N/A  . Years of Education: N/A   Occupational History  . Not on file.   Social History Main Topics  . Smoking status: Never Smoker   . Smokeless tobacco: Never Used  . Alcohol Use: No  . Drug Use: No  . Sexual Activity: No   Other Topics Concern  . Not on file   Social History Narrative  :  Pertinent items are noted in HPI.  Exam: Blood pressure 137/53, pulse 75, temperature 98.1 F (36.7 C), temperature source Oral, resp. rate 18, height 5' 4.5"  (1.638 m), weight 165 lb 8 oz (75.07 kg), SpO2 96 %. General appearance: alert and cooperative appeared without distress. Throat: lips, mucosa, and tongue normal; teeth and gums normal no oral ulcers or lesions. Neck: no adenopathy or thyromegaly. Back: negative Resp: clear to auscultation bilaterally no wheezes or dullness to percussion. Chest wall: no tenderness or masses palpated. Cardio: regular rate and rhythm, S1, S2 normal, no murmur, click, rub or gallop GI: soft, non-tender; bowel sounds normal; no masses. no rebound or guarding. No splenomegaly. Extremities: extremities normal, atraumatic, no cyanosis or edema Pulses: 2+ and symmetric Skin: Skin color, texture, turgor normal. No rashes or lesions Lymph nodes: Cervical, supraclavicular, and axillary nodes normal.   Recent Labs  12/15/15 1116  WBC 9.7  HGB 9.5*  HCT 30.8*  PLT 308     Assessment and Plan:   75 year old woman with the following issues:  1. Iron deficiency anemia presented with a hemoglobin have ranged between 9 and 10 since 2015. Her most recent CBC showed a hemoglobin of 9.5 and low MCV of 78 and elevated RDW of 16.8. Her white cell count and platelet count are normal with a normal white cell count and differential.  The differential diagnosis of these findings discussed with the patient. Most likely we are dealing with iron deficiency anemia given the documented low iron stores as well as her intolerance to oral iron. She could have also contributing anemia of renal insufficiency as evident by her creatinine clearance of less than 50 mL/m. There is no clear-cut blood losses noted and she is up-to-date on her colonoscopy screening. It is very possible she could have a digestive issue and inability to absorb oral iron. She is clearly intolerant to oral iron and will require intravenous therapy.  Risks and benefits of IV iron in the form of Feraheme was discussed today including complications such as  arthralgias, myalgias and infusion related complications. Anaphylaxis have really been noted with this particular preparation.  She is agreeable to proceed and she will need likely total 1000 mg given in 2 sessions. Once those are complete, I will repeat iron studies in 3 months.  2. Abdominal pain: Unclear etiology and further workup might be needed including abdominal ultrasound and possible CT scan if the symptoms persist. Malignancy causing GI blood losses would be a possibility for her iron deficiency and need to be evaluated further for symptoms persist. She does not have any signs or symptoms of malignancy otherwise and she is up to date for age-appropriate cancer screening.  3. Follow-up: Will be in 3 months with a repeat iron studies.

## 2015-12-20 ENCOUNTER — Ambulatory Visit (HOSPITAL_BASED_OUTPATIENT_CLINIC_OR_DEPARTMENT_OTHER): Payer: Medicare Other

## 2015-12-20 VITALS — BP 142/49 | HR 74 | Temp 97.9°F | Resp 16

## 2015-12-20 DIAGNOSIS — D509 Iron deficiency anemia, unspecified: Secondary | ICD-10-CM | POA: Diagnosis not present

## 2015-12-20 MED ORDER — SODIUM CHLORIDE 0.9 % IV SOLN
Freq: Once | INTRAVENOUS | Status: AC
  Administered 2015-12-20: 09:00:00 via INTRAVENOUS

## 2015-12-20 MED ORDER — SODIUM CHLORIDE 0.9 % IV SOLN
510.0000 mg | Freq: Once | INTRAVENOUS | Status: AC
  Administered 2015-12-20: 510 mg via INTRAVENOUS
  Filled 2015-12-20: qty 17

## 2015-12-20 NOTE — Progress Notes (Signed)
Pt has had diarrhea since wed & reports x 4 this am.  She also has some epigastric pain but denies any other symptoms.  Discussed with Dr Alen Blew & OK to treat despite symptoms. Pt took 4 imodium this am already.  Discussed clear liquids x 24 hrs & then if symptoms improved may try BRAT diet & slowly progress to regular diet.

## 2015-12-20 NOTE — Patient Instructions (Signed)

## 2015-12-22 ENCOUNTER — Emergency Department (HOSPITAL_COMMUNITY): Payer: Medicare Other

## 2015-12-22 ENCOUNTER — Emergency Department (HOSPITAL_COMMUNITY)
Admission: EM | Admit: 2015-12-22 | Discharge: 2015-12-22 | Disposition: A | Discharge status: Home / Self Care | Payer: Medicare Other | Attending: Emergency Medicine | Admitting: Emergency Medicine

## 2015-12-22 ENCOUNTER — Encounter (HOSPITAL_COMMUNITY): Payer: Self-pay

## 2015-12-22 ENCOUNTER — Other Ambulatory Visit: Payer: Medicare Other

## 2015-12-22 DIAGNOSIS — Z9049 Acquired absence of other specified parts of digestive tract: Secondary | ICD-10-CM | POA: Insufficient documentation

## 2015-12-22 DIAGNOSIS — I10 Essential (primary) hypertension: Secondary | ICD-10-CM | POA: Diagnosis not present

## 2015-12-22 DIAGNOSIS — Z794 Long term (current) use of insulin: Secondary | ICD-10-CM | POA: Insufficient documentation

## 2015-12-22 DIAGNOSIS — Z79899 Other long term (current) drug therapy: Secondary | ICD-10-CM | POA: Insufficient documentation

## 2015-12-22 DIAGNOSIS — K529 Noninfective gastroenteritis and colitis, unspecified: Secondary | ICD-10-CM

## 2015-12-22 DIAGNOSIS — R109 Unspecified abdominal pain: Secondary | ICD-10-CM | POA: Diagnosis present

## 2015-12-22 DIAGNOSIS — K219 Gastro-esophageal reflux disease without esophagitis: Secondary | ICD-10-CM | POA: Diagnosis not present

## 2015-12-22 DIAGNOSIS — J45909 Unspecified asthma, uncomplicated: Secondary | ICD-10-CM | POA: Insufficient documentation

## 2015-12-22 DIAGNOSIS — M1990 Unspecified osteoarthritis, unspecified site: Secondary | ICD-10-CM | POA: Diagnosis not present

## 2015-12-22 DIAGNOSIS — Z90710 Acquired absence of both cervix and uterus: Secondary | ICD-10-CM | POA: Diagnosis not present

## 2015-12-22 DIAGNOSIS — E119 Type 2 diabetes mellitus without complications: Secondary | ICD-10-CM | POA: Diagnosis not present

## 2015-12-22 DIAGNOSIS — R011 Cardiac murmur, unspecified: Secondary | ICD-10-CM | POA: Insufficient documentation

## 2015-12-22 LAB — URINALYSIS, ROUTINE W REFLEX MICROSCOPIC
Bilirubin Urine: NEGATIVE
GLUCOSE, UA: NEGATIVE mg/dL
HGB URINE DIPSTICK: NEGATIVE
Ketones, ur: 15 mg/dL — AB
Nitrite: NEGATIVE
PH: 6 (ref 5.0–8.0)
PROTEIN: NEGATIVE mg/dL
SPECIFIC GRAVITY, URINE: 1.018 (ref 1.005–1.030)

## 2015-12-22 LAB — CBC
HEMATOCRIT: 34.2 % — AB (ref 36.0–46.0)
Hemoglobin: 11 g/dL — ABNORMAL LOW (ref 12.0–15.0)
MCH: 25 pg — AB (ref 26.0–34.0)
MCHC: 32.2 g/dL (ref 30.0–36.0)
MCV: 77.7 fL — AB (ref 78.0–100.0)
PLATELETS: 351 10*3/uL (ref 150–400)
RBC: 4.4 MIL/uL (ref 3.87–5.11)
RDW: 15.2 % (ref 11.5–15.5)
WBC: 12 10*3/uL — AB (ref 4.0–10.5)

## 2015-12-22 LAB — COMPREHENSIVE METABOLIC PANEL
ALT: 14 U/L (ref 14–54)
ANION GAP: 10 (ref 5–15)
AST: 16 U/L (ref 15–41)
Albumin: 4.5 g/dL (ref 3.5–5.0)
Alkaline Phosphatase: 100 U/L (ref 38–126)
BUN: 12 mg/dL (ref 6–20)
CHLORIDE: 99 mmol/L — AB (ref 101–111)
CO2: 27 mmol/L (ref 22–32)
CREATININE: 0.92 mg/dL (ref 0.44–1.00)
Calcium: 9.8 mg/dL (ref 8.9–10.3)
GFR, EST NON AFRICAN AMERICAN: 60 mL/min — AB (ref >60)
Glucose, Bld: 144 mg/dL — ABNORMAL HIGH (ref 65–99)
POTASSIUM: 4.1 mmol/L (ref 3.5–5.1)
SODIUM: 136 mmol/L (ref 135–145)
Total Bilirubin: 0.4 mg/dL (ref 0.3–1.2)
Total Protein: 7.6 g/dL (ref 6.5–8.1)

## 2015-12-22 LAB — URINE MICROSCOPIC-ADD ON
Bacteria, UA: NONE SEEN
RBC / HPF: NONE SEEN RBC/hpf (ref 0–5)

## 2015-12-22 LAB — LIPASE, BLOOD: LIPASE: 14 U/L (ref 11–51)

## 2015-12-22 MED ORDER — SODIUM CHLORIDE 0.9 % IV BOLUS (SEPSIS)
1000.0000 mL | Freq: Once | INTRAVENOUS | Status: AC
  Administered 2015-12-22: 1000 mL via INTRAVENOUS

## 2015-12-22 MED ORDER — METOCLOPRAMIDE HCL 5 MG/ML IJ SOLN
10.0000 mg | Freq: Once | INTRAMUSCULAR | Status: AC
  Administered 2015-12-22: 10 mg via INTRAVENOUS
  Filled 2015-12-22: qty 2

## 2015-12-22 MED ORDER — ONDANSETRON HCL 4 MG/2ML IJ SOLN
4.0000 mg | Freq: Once | INTRAMUSCULAR | Status: AC
  Administered 2015-12-22: 4 mg via INTRAVENOUS
  Filled 2015-12-22: qty 2

## 2015-12-22 MED ORDER — IOPAMIDOL (ISOVUE-300) INJECTION 61%
100.0000 mL | Freq: Once | INTRAVENOUS | Status: AC | PRN
  Administered 2015-12-22: 100 mL via INTRAVENOUS

## 2015-12-22 MED ORDER — DIPHENOXYLATE-ATROPINE 2.5-0.025 MG PO TABS: 1.0000 | ORAL_TABLET | Freq: Four times a day (QID) | ORAL | Status: DC | PRN

## 2015-12-22 MED ORDER — FENTANYL CITRATE (PF) 100 MCG/2ML IJ SOLN
50.0000 ug | Freq: Once | INTRAMUSCULAR | Status: AC
  Administered 2015-12-22: 50 ug via INTRAVENOUS
  Filled 2015-12-22: qty 2

## 2015-12-22 MED ORDER — ONDANSETRON HCL 4 MG PO TABS: 4.0000 mg | ORAL_TABLET | Freq: Once | ORAL | Status: DC

## 2015-12-22 MED ORDER — DIATRIZOATE MEGLUMINE & SODIUM 66-10 % PO SOLN: 30.0000 mL | Freq: Once | ORAL | Status: DC

## 2015-12-22 MED ORDER — ONDANSETRON HCL 4 MG PO TABS: 4.0000 mg | ORAL_TABLET | Freq: Four times a day (QID) | ORAL | Status: DC

## 2015-12-22 NOTE — ED Notes (Signed)
CT inadvertently clicked off. Pt has not yet gone for scan.

## 2015-12-22 NOTE — ED Notes (Signed)
Patient changed into burgundy scrubs and wanded by security. 

## 2015-12-22 NOTE — ED Notes (Signed)
Patient reports N/V/D and upper abdominal pain x 1 week.

## 2015-12-22 NOTE — ED Provider Notes (Signed)
CSN: [id number]     Arrival date & time 12/22/15  0803 History   First MD Initiated Contact with Patient 12/22/15 [phone]     Chief Complaint  Patient presents with  . Emesis  . Abdominal Pain  . Diarrhea     (Consider location/radiation/quality/duration/timing/severity/associated sxs/prior Treatment) HPI.... Nausea, vomiting, diarrhea for 1 week. No vomiting blood, bloody stool, black stool, mucus in stool. She lives at home and has been ambulatory. No fever, sweats, chills, chest pain, dyspnea, dysuria. Nothing makes symptoms better or worse.  Past Medical History  Diagnosis Date  . Diabetes mellitus   . Hypertension   . Asthma   . S/P appy   . PONV (postoperative nausea and vomiting)   . Heart murmur   . GERD (gastroesophageal reflux disease)   . Arthritis    Past Surgical History  Procedure Laterality Date  . Appendectomy  1952  . Vaginal hysterectomy  1977  . Knee arthroscopy  2003 2005    right knee  . Mass fallopian tube    . Incontinence surgery    . Joint replacement Right   . Hand surgery Left 2010    operated on index and ring finger  . Spinal cord stimulator battery exchange N/A 09/09/2015    Procedure: SPINAL CORD STIMULATOR BATTERY EXCHANGE;  Surgeon: Melina Schools, MD;  Location: [facility];  Service: Orthopedics;  Laterality: N/A;   Family History  Problem Relation Age of Onset  . Colon cancer Neg Hx   . Lung cancer Mother   . Heart attack Father    Social History  Substance Use Topics  . Smoking status: Never Smoker   . Smokeless tobacco: Never Used  . Alcohol Use: No   OB History    No data available     Review of Systems  All other systems reviewed and are negative.     Allergies  Vasotec; Codeine; Lansoprazole; Morphine and related; Sulfate; Atorvastatin; Adhesive; and Scopolamine  Home Medications   Prior to Admission medications   Medication Sig Start Date End Date Taking? Authorizing Provider  albuterol (PROAIR HFA) 108 (90 BASE)  MCG/ACT inhaler Inhale 2 puffs into the lungs every 4 (four) hours as needed for wheezing or shortness of breath. 02/02/15  Yes Collene Gobble, MD  albuterol (PROVENTIL) (2.5 MG/3ML) 0.083% nebulizer solution Take 3 mLs (2.5 mg total) by nebulization every 6 (six) hours as needed for wheezing or shortness of breath. 09/10/13  Yes Theodis Blaze, MD  amLODipine (NORVASC) 10 MG tablet Take 10 mg by mouth daily.   Yes Historical Provider, MD  budesonide-formoterol (SYMBICORT) 160-4.5 MCG/ACT inhaler Inhale 2 puffs into the lungs 2 (two) times daily. Patient taking differently: Inhale 2 puffs into the lungs 2 (two) times daily as needed (Wheezing, cough).  02/02/15  Yes Collene Gobble, MD  esomeprazole (NEXIUM) 40 MG capsule Take 40 mg by mouth daily. 04/23/15  Yes Historical Provider, MD  gabapentin (NEURONTIN) 300 MG capsule Take 600 mg by mouth See admin instructions. 1-3 times daily   Yes Historical Provider, MD  Insulin Glargine (LANTUS SOLOSTAR [place]) Inject 50 Units into the skin at bedtime.    Yes Historical Provider, MD  insulin lispro (HUMALOG) 100 UNIT/ML SOCT Inject 22-28 Units into the skin 3 (three) times daily with meals. Sliding scale   Yes Historical Provider, MD  losartan (COZAAR) 100 MG tablet Take 100 mg by mouth daily.   Yes Historical Provider, MD  metoprolol (LOPRESSOR) 50 MG tablet Take 75  mg by mouth 2 (two) times daily.   Yes Historical Provider, MD  Multiple Vitamins-Minerals (PRESERVISION AREDS 2 PO) Take 1 tablet by mouth 2 (two) times daily.    Yes Historical Provider, MD  Pitavastatin Calcium (LIVALO) 1 MG TABS Take 1 mg by mouth at bedtime.   Yes Historical Provider, MD  triamterene-hydrochlorothiazide (DYAZIDE) 37.5-25 MG per capsule Take 2 capsules by mouth daily after lunch.    Yes Historical Provider, MD  diphenoxylate-atropine (LOMOTIL) 2.5-0.025 MG tablet Take 1 tablet by mouth 4 (four) times daily as needed for diarrhea or loose stools. 12/22/15   Nat Christen, MD   guaiFENesin-dextromethorphan (ROBITUSSIN DM) 100-10 MG/5ML syrup Take 5 mLs by mouth every 4 (four) hours as needed for cough. Patient not taking: Reported on 12/22/2015 09/10/13   Theodis Blaze, MD  methocarbamol (ROBAXIN) 500 MG tablet Take 1 tablet (500 mg total) by mouth 3 (three) times daily as needed for muscle spasms. Patient not taking: Reported on 12/22/2015 09/09/15   Melina Schools, MD  ondansetron (ZOFRAN) 4 MG tablet Take 1 tablet (4 mg total) by mouth every 6 (six) hours. 12/22/15   Nat Christen, MD   BP 154/57 mmHg  Pulse 97  Temp(Src) 98.2 F (36.8 C) (Oral)  Resp 19  Ht 5' 4.5" (1.638 m)  SpO2 96% Physical Exam  Constitutional: She is oriented to person, place, and time. She appears well-developed and well-nourished.  HENT:  Head: Normocephalic and atraumatic.  Eyes: Conjunctivae and EOM are normal. Pupils are equal, round, and reactive to light.  Neck: Normal range of motion. Neck supple.  Cardiovascular: Normal rate and regular rhythm.   Pulmonary/Chest: Effort normal and breath sounds normal.  Abdominal: Soft. Bowel sounds are normal.  Minimal epigastric tenderness  Musculoskeletal: Normal range of motion.  Neurological: She is alert and oriented to person, place, and time.  Skin: Skin is warm and dry.  Psychiatric: She has a normal mood and affect. Her behavior is normal.  Nursing note and vitals reviewed.   ED Course  Procedures (including critical care time) Labs Review Labs Reviewed  COMPREHENSIVE METABOLIC PANEL - Abnormal; Notable for the following:    Chloride 99 (*)    Glucose, Bld 144 (*)    GFR calc non Af Amer 60 (*)    All other components within normal limits  CBC - Abnormal; Notable for the following:    WBC 12.0 (*)    Hemoglobin 11.0 (*)    HCT 34.2 (*)    MCV 77.7 (*)    MCH 25.0 (*)    All other components within normal limits  URINALYSIS, ROUTINE W REFLEX MICROSCOPIC (NOT AT [facility]) - Abnormal; Notable for the following:    Ketones, ur 15  (*)    Leukocytes, UA TRACE (*)    All other components within normal limits  URINE MICROSCOPIC-ADD ON - Abnormal; Notable for the following:    Squamous Epithelial / LPF 6-30 (*)    All other components within normal limits  LIPASE, BLOOD    Imaging Review Ct Abdomen Pelvis W Contrast  12/22/2015  CLINICAL DATA:  75 year old female with a history of nausea vomiting and diarrhea. Given history of prior appendectomy. EXAM: CT ABDOMEN AND PELVIS WITH CONTRAST TECHNIQUE: Multidetector CT imaging of the abdomen and pelvis was performed using the standard protocol following bolus administration of intravenous contrast. CONTRAST:  171mL ISOVUE-300 IOPAMIDOL (ISOVUE-300) INJECTION 61% COMPARISON:  05/19/2015 FINDINGS: Lower chest: Unremarkable appearance of the soft tissues of the chest wall.  Heart size within normal limits.  No pericardial fluid/thickening. No lower mediastinal adenopathy. Unremarkable appearance of the distal esophagus. No hiatal hernia. No confluent airspace disease, pleural fluid, or pneumothorax within visualized lung. Unchanged 3 mm nodule of the right lower lobe. Spinal stimulator partially visualized, obscuring the spinal canal. Abdomen/pelvis: Unremarkable appearance of liver and spleen. Unremarkable appearance of bilateral adrenal glands. No peripancreatic or pericholecystic fluid or inflammatory changes. No radio-opaque gallstones. No intrahepatic or extrahepatic biliary ductal dilatation. No intra-peritoneal free air or significant free-fluid. No abnormally dilated small bowel or colon. No transition point. No inflammatory changes of the mesenteries. Mild diverticular disease without associated inflammatory changes. Right Kidney/Ureter: No hydronephrosis. No nephrolithiasis. No perinephric stranding. Unremarkable course of the right ureter. Left Kidney/Ureter: No hydronephrosis. No nephrolithiasis. No perinephric stranding. Unremarkable course of the left ureter. Unremarkable  appearance of the urinary bladder. Nonspecific pelvic floor laxity, with evidence of rectocele and cystocele. No significant vascular calcification. No aneurysm or periaortic fluid identified. Musculoskeletal: No displaced fracture identified. No significant degenerative changes of the spine. Small fact containing inguinal hernia. Generator within the soft tissues overlying the left buttock musculature. IMPRESSION: No acute finding identified. Re- demonstration of 3 mm nodule of the right lower lobe. As was noted on prior abdominal CT, in the absence of a known malignancy and the possibility of metastatic disease, a follow-up CT at 1 year from the initial finding (therefore September 2017) is indicated if the patient has risk factors for cancer. If no risk factors are present, no further imaging warranted. Signed, Dulcy Fanny. Earleen Newport, DO [facility] Radiology Electronically Signed   By: Corrie Mckusick D.O.   On: 12/22/2015 13:44   I have personally reviewed and evaluated these images and lab results as part of my medical decision-making.   EKG Interpretation None      MDM   Final diagnoses:  Gastroenteritis    Patient given 2 L of IV fluids and antiemetics. CT scan shows no acute findings. There is a 3 mm nodule in the right lower lobe.  Discharge medications Zofran 4 mg of Lomotil. No acute abdomen at discharge.    Nat Christen, MD 12/23/15 0930

## 2015-12-22 NOTE — Discharge Instructions (Signed)
Clear liquids for next 12 hours. Medication for nausea and diarrhea. Rest. Return if worse. You have a very small nodule in your right lower lung. This was noted in the fall of 2016. You will need a repeat scan this fall to assess the right lung

## 2015-12-22 NOTE — ED Notes (Signed)
Pt drank contrast medium and immediately vomited up approximately 349ml of clear/yellow emesis. MD notified.

## 2015-12-24 ENCOUNTER — Encounter (HOSPITAL_BASED_OUTPATIENT_CLINIC_OR_DEPARTMENT_OTHER): Payer: Self-pay | Admitting: Emergency Medicine

## 2015-12-24 ENCOUNTER — Emergency Department (HOSPITAL_BASED_OUTPATIENT_CLINIC_OR_DEPARTMENT_OTHER)
Admission: EM | Admit: 2015-12-24 | Discharge: 2015-12-24 | Disposition: A | Discharge status: Home / Self Care | Payer: Medicare Other | Attending: Emergency Medicine | Admitting: Emergency Medicine

## 2015-12-24 DIAGNOSIS — R1030 Lower abdominal pain, unspecified: Secondary | ICD-10-CM | POA: Diagnosis not present

## 2015-12-24 DIAGNOSIS — I10 Essential (primary) hypertension: Secondary | ICD-10-CM | POA: Diagnosis not present

## 2015-12-24 DIAGNOSIS — R110 Nausea: Secondary | ICD-10-CM | POA: Insufficient documentation

## 2015-12-24 DIAGNOSIS — J45909 Unspecified asthma, uncomplicated: Secondary | ICD-10-CM | POA: Diagnosis not present

## 2015-12-24 DIAGNOSIS — E119 Type 2 diabetes mellitus without complications: Secondary | ICD-10-CM | POA: Insufficient documentation

## 2015-12-24 DIAGNOSIS — R109 Unspecified abdominal pain: Secondary | ICD-10-CM | POA: Diagnosis present

## 2015-12-24 LAB — COMPREHENSIVE METABOLIC PANEL
ALBUMIN: 4.2 g/dL (ref 3.5–5.0)
ALK PHOS: 93 U/L (ref 38–126)
ALT: 22 U/L (ref 14–54)
AST: 33 U/L (ref 15–41)
Anion gap: 7 (ref 5–15)
BUN: 15 mg/dL (ref 6–20)
CALCIUM: 9.4 mg/dL (ref 8.9–10.3)
CO2: 27 mmol/L (ref 22–32)
CREATININE: 0.9 mg/dL (ref 0.44–1.00)
Chloride: 100 mmol/L — ABNORMAL LOW (ref 101–111)
GFR calc Af Amer: >60 mL/min (ref >60)
GFR calc non Af Amer: >60 mL/min (ref >60)
GLUCOSE: 121 mg/dL — AB (ref 65–99)
Potassium: 3.8 mmol/L (ref 3.5–5.1)
SODIUM: 134 mmol/L — AB (ref 135–145)
Total Bilirubin: 0.4 mg/dL (ref 0.3–1.2)
Total Protein: 7.3 g/dL (ref 6.5–8.1)

## 2015-12-24 LAB — URINALYSIS, ROUTINE W REFLEX MICROSCOPIC
Glucose, UA: NEGATIVE mg/dL
HGB URINE DIPSTICK: NEGATIVE
Ketones, ur: 15 mg/dL — AB
Nitrite: NEGATIVE
Protein, ur: 30 mg/dL — AB
SPECIFIC GRAVITY, URINE: 1.028 (ref 1.005–1.030)
pH: 6 (ref 5.0–8.0)

## 2015-12-24 LAB — CBC
HCT: 35.7 % — ABNORMAL LOW (ref 36.0–46.0)
HEMOGLOBIN: 11.1 g/dL — AB (ref 12.0–15.0)
MCH: 24.8 pg — AB (ref 26.0–34.0)
MCHC: 31.1 g/dL (ref 30.0–36.0)
MCV: 79.7 fL (ref 78.0–100.0)
PLATELETS: 335 10*3/uL (ref 150–400)
RBC: 4.48 MIL/uL (ref 3.87–5.11)
RDW: 16.7 % — AB (ref 11.5–15.5)
WBC: 13 10*3/uL — ABNORMAL HIGH (ref 4.0–10.5)

## 2015-12-24 LAB — LIPASE, BLOOD: Lipase: 13 U/L (ref 11–51)

## 2015-12-24 LAB — URINE MICROSCOPIC-ADD ON: RBC / HPF: NONE SEEN RBC/hpf (ref 0–5)

## 2015-12-24 LAB — TROPONIN I

## 2015-12-24 MED ORDER — DOCUSATE SODIUM 100 MG PO CAPS: 100.0000 mg | ORAL_CAPSULE | Freq: Two times a day (BID) | ORAL | Status: DC

## 2015-12-24 MED ORDER — PROCHLORPERAZINE EDISYLATE 5 MG/ML IJ SOLN
5.0000 mg | Freq: Once | INTRAMUSCULAR | Status: DC
  Filled 2015-12-24: qty 2

## 2015-12-24 MED ORDER — HYDROCODONE-ACETAMINOPHEN 5-325 MG PO TABS
1.0000 | ORAL_TABLET | Freq: Four times a day (QID) | ORAL | Status: DC | PRN
Start: 2015-12-24 — End: 2016-11-14

## 2015-12-24 MED ORDER — PROMETHAZINE HCL 25 MG PO TABS: 25.0000 mg | ORAL_TABLET | Freq: Four times a day (QID) | ORAL | Status: DC | PRN

## 2015-12-24 MED ORDER — PROCHLORPERAZINE EDISYLATE 5 MG/ML IJ SOLN
10.0000 mg | Freq: Once | INTRAMUSCULAR | Status: AC
  Administered 2015-12-24: 10 mg via INTRAVENOUS

## 2015-12-24 MED ORDER — PROCHLORPERAZINE EDISYLATE 5 MG/ML IJ SOLN
5.0000 mg | Freq: Once | INTRAMUSCULAR | Status: AC
  Administered 2015-12-24: 5 mg via INTRAVENOUS
  Filled 2015-12-24: qty 2

## 2015-12-24 MED ORDER — MECLIZINE HCL 25 MG PO TABS: 25.0000 mg | ORAL_TABLET | Freq: Three times a day (TID) | ORAL | Status: DC | PRN

## 2015-12-24 MED FILL — DOK 100 MG SOFTGEL: 100 | 50 days supply | Qty: 100 | Fill #0

## 2015-12-24 MED FILL — MECLIZINE 25 MG TABLET: 25 | 10 days supply | Qty: 30 | Fill #0

## 2015-12-24 MED FILL — PROMETHAZINE 25 MG TABLET: 25 | 5 days supply | Qty: 20 | Fill #0

## 2015-12-24 MED FILL — HYDROCODON-APAP 5-325: 5-325 | 2 days supply | Qty: 20 | Fill #0

## 2015-12-24 NOTE — ED Notes (Signed)
Bedside commode placed at bedside. PA in with patient at this time.

## 2015-12-24 NOTE — ED Notes (Signed)
Pt received first iron infusion on Monday and abdominal pain, as well as nausea has increased since then, pt reports that she received infusion due to low hemoglobin, pt is weak, no appetite and reporting severe pain to left flank

## 2015-12-24 NOTE — ED Provider Notes (Signed)
CSN: [id number]     Arrival date & time 12/24/15  1000 History   First MD Initiated Contact with Patient 12/24/15 1024     Chief Complaint  Patient presents with  . Abdominal Pain     (Consider location/radiation/quality/duration/timing/severity/associated sxs/prior Treatment) HPI  This is a 75 year old female who presents emergency Department with chief complaint of abdominal pain and nausea. Patient states she has one had 1 week of diarrhea, nausea and vomiting. She was seen for the same 2 days ago at [facility] emergency department and had a negative workup including a negative CT. Her CT did show diverticular disease without evidence of inflammatory changes. The patient states that her diarrhea resolved 2 days ago. She has not vomited since 2 days ago. She just continues to have severe nausea and pain in the lower quadrants of her abdomen. Patient states that her blood sugars have been running normally. She has had no episodes of hypoglycemia. She did not feel like eating last night, so she ate some candy after she took her Lantus without significant change in her blood sugar levels. She denies fevers, chills, urinary symptoms. She denies any melena, hematochezia or mucus in her stools. She has not had anything like this before. She has chest pain, shortness of breath.  Past Medical History  Diagnosis Date  . Diabetes mellitus   . Hypertension   . Asthma   . S/P appy   . PONV (postoperative nausea and vomiting)   . Heart murmur   . GERD (gastroesophageal reflux disease)   . Arthritis    Past Surgical History  Procedure Laterality Date  . Appendectomy  1952  . Vaginal hysterectomy  1977  . Knee arthroscopy  2003 2005    right knee  . Mass fallopian tube    . Incontinence surgery    . Joint replacement Right   . Hand surgery Left 2010    operated on index and ring finger  . Spinal cord stimulator battery exchange N/A 09/09/2015    Procedure: SPINAL CORD STIMULATOR BATTERY  EXCHANGE;  Surgeon: Melina Schools, MD;  Location: [place];  Service: Orthopedics;  Laterality: N/A;   Family History  Problem Relation Age of Onset  . Colon cancer Neg Hx   . Lung cancer Mother   . Heart attack Father    Social History  Substance Use Topics  . Smoking status: Never Smoker   . Smokeless tobacco: Never Used  . Alcohol Use: No   OB History    No data available     Review of Systems    Allergies  Vasotec; Codeine; Lansoprazole; Morphine and related; Sulfate; Atorvastatin; Adhesive; and Scopolamine  Home Medications   Prior to Admission medications   Medication Sig Start Date End Date Taking? Authorizing Provider  albuterol (PROAIR HFA) 108 (90 BASE) MCG/ACT inhaler Inhale 2 puffs into the lungs every 4 (four) hours as needed for wheezing or shortness of breath. 02/02/15   Collene Gobble, MD  albuterol (PROVENTIL) (2.5 MG/3ML) 0.083% nebulizer solution Take 3 mLs (2.5 mg total) by nebulization every 6 (six) hours as needed for wheezing or shortness of breath. 09/10/13   Theodis Blaze, MD  amLODipine (NORVASC) 10 MG tablet Take 10 mg by mouth daily.    Historical Provider, MD  budesonide-formoterol (SYMBICORT) 160-4.5 MCG/ACT inhaler Inhale 2 puffs into the lungs 2 (two) times daily. Patient taking differently: Inhale 2 puffs into the lungs 2 (two) times daily as needed (Wheezing, cough).  02/02/15  Collene Gobble, MD  diphenoxylate-atropine (LOMOTIL) 2.5-0.025 MG tablet Take 1 tablet by mouth 4 (four) times daily as needed for diarrhea or loose stools. 12/22/15   Nat Christen, MD  esomeprazole (NEXIUM) 40 MG capsule Take 40 mg by mouth daily. 04/23/15   Historical Provider, MD  gabapentin (NEURONTIN) 300 MG capsule Take 600 mg by mouth See admin instructions. 1-3 times daily    Historical Provider, MD  guaiFENesin-dextromethorphan (ROBITUSSIN DM) 100-10 MG/5ML syrup Take 5 mLs by mouth every 4 (four) hours as needed for cough. Patient not taking: Reported on 12/22/2015 09/10/13    Theodis Blaze, MD  Insulin Glargine (LANTUS SOLOSTAR [place]) Inject 50 Units into the skin at bedtime.     Historical Provider, MD  insulin lispro (HUMALOG) 100 UNIT/ML SOCT Inject 22-28 Units into the skin 3 (three) times daily with meals. Sliding scale    Historical Provider, MD  losartan (COZAAR) 100 MG tablet Take 100 mg by mouth daily.    Historical Provider, MD  methocarbamol (ROBAXIN) 500 MG tablet Take 1 tablet (500 mg total) by mouth 3 (three) times daily as needed for muscle spasms. Patient not taking: Reported on 12/22/2015 09/09/15   Melina Schools, MD  metoprolol (LOPRESSOR) 50 MG tablet Take 75 mg by mouth 2 (two) times daily.    Historical Provider, MD  Multiple Vitamins-Minerals (PRESERVISION AREDS 2 PO) Take 1 tablet by mouth 2 (two) times daily.     Historical Provider, MD  ondansetron (ZOFRAN) 4 MG tablet Take 1 tablet (4 mg total) by mouth every 6 (six) hours. 12/22/15   Nat Christen, MD  Pitavastatin Calcium (LIVALO) 1 MG TABS Take 1 mg by mouth at bedtime.    Historical Provider, MD  triamterene-hydrochlorothiazide (DYAZIDE) 37.5-25 MG per capsule Take 2 capsules by mouth daily after lunch.     Historical Provider, MD   BP 136/47 mmHg  Pulse 85  Temp(Src) 98.2 F (36.8 C) (Oral)  Resp 17  Wt 74.844 kg  SpO2 99% Physical Exam  Constitutional: She is oriented to person, place, and time. She appears well-developed and well-nourished. No distress.  HENT:  Head: Normocephalic and atraumatic.  Eyes: Conjunctivae are normal. No scleral icterus.  Neck: Normal range of motion.  Cardiovascular: Normal rate, regular rhythm and normal heart sounds.  Exam reveals no gallop and no friction rub.   No murmur heard. Pulmonary/Chest: Effort normal and breath sounds normal. No respiratory distress.  Abdominal: Soft. Bowel sounds are normal. She exhibits no distension and no mass. There is tenderness (diffuse tenderness). There is no guarding.  Genitourinary:  Vaginal exam reveals normal  external female genitalia. Palpable rectocele and cystocele. No tenderness or discharge on examination.  Digital Rectal Exam reveals sphincter with good tone. No external hemorrhoids. No masses or fissures. Stool color is brown with no overt blood.   Neurological: She is alert and oriented to person, place, and time.  Skin: Skin is warm and dry. She is not diaphoretic.  Nursing note and vitals reviewed.   ED Course  Procedures (including critical care time) Labs Review Labs Reviewed  COMPREHENSIVE METABOLIC PANEL - Abnormal; Notable for the following:    Sodium 134 (*)    Chloride 100 (*)    Glucose, Bld 121 (*)    All other components within normal limits  CBC - Abnormal; Notable for the following:    WBC 13.0 (*)    Hemoglobin 11.1 (*)    HCT 35.7 (*)    MCH 24.8 (*)  RDW 16.7 (*)    All other components within normal limits  URINALYSIS, ROUTINE W REFLEX MICROSCOPIC (NOT AT [facility]) - Abnormal; Notable for the following:    Color, Urine AMBER (*)    Bilirubin Urine SMALL (*)    Ketones, ur 15 (*)    Protein, ur 30 (*)    Leukocytes, UA TRACE (*)    All other components within normal limits  URINE MICROSCOPIC-ADD ON - Abnormal; Notable for the following:    Squamous Epithelial / LPF 0-5 (*)    Bacteria, UA RARE (*)    All other components within normal limits  LIPASE, BLOOD  TROPONIN I    Imaging Review Ct Abdomen Pelvis W Contrast  12/22/2015  CLINICAL DATA:  75 year old female with a history of nausea vomiting and diarrhea. Given history of prior appendectomy. EXAM: CT ABDOMEN AND PELVIS WITH CONTRAST TECHNIQUE: Multidetector CT imaging of the abdomen and pelvis was performed using the standard protocol following bolus administration of intravenous contrast. CONTRAST:  162mL ISOVUE-300 IOPAMIDOL (ISOVUE-300) INJECTION 61% COMPARISON:  05/19/2015 FINDINGS: Lower chest: Unremarkable appearance of the soft tissues of the chest wall. Heart size within normal limits.  No  pericardial fluid/thickening. No lower mediastinal adenopathy. Unremarkable appearance of the distal esophagus. No hiatal hernia. No confluent airspace disease, pleural fluid, or pneumothorax within visualized lung. Unchanged 3 mm nodule of the right lower lobe. Spinal stimulator partially visualized, obscuring the spinal canal. Abdomen/pelvis: Unremarkable appearance of liver and spleen. Unremarkable appearance of bilateral adrenal glands. No peripancreatic or pericholecystic fluid or inflammatory changes. No radio-opaque gallstones. No intrahepatic or extrahepatic biliary ductal dilatation. No intra-peritoneal free air or significant free-fluid. No abnormally dilated small bowel or colon. No transition point. No inflammatory changes of the mesenteries. Mild diverticular disease without associated inflammatory changes. Right Kidney/Ureter: No hydronephrosis. No nephrolithiasis. No perinephric stranding. Unremarkable course of the right ureter. Left Kidney/Ureter: No hydronephrosis. No nephrolithiasis. No perinephric stranding. Unremarkable course of the left ureter. Unremarkable appearance of the urinary bladder. Nonspecific pelvic floor laxity, with evidence of rectocele and cystocele. No significant vascular calcification. No aneurysm or periaortic fluid identified. Musculoskeletal: No displaced fracture identified. No significant degenerative changes of the spine. Small fact containing inguinal hernia. Generator within the soft tissues overlying the left buttock musculature. IMPRESSION: No acute finding identified. Re- demonstration of 3 mm nodule of the right lower lobe. As was noted on prior abdominal CT, in the absence of a known malignancy and the possibility of metastatic disease, a follow-up CT at 1 year from the initial finding (therefore September 2017) is indicated if the patient has risk factors for cancer. If no risk factors are present, no further imaging warranted. Signed, Dulcy Fanny. Earleen Newport, DO  [facility] Electronically Signed   By: Corrie Mckusick D.O.   On: 12/22/2015 13:44   I have personally reviewed and evaluated these images and lab results as part of my medical decision-making.   EKG Interpretation   Date/Time:  Friday December 24 2015 11:04:37 EDT Ventricular Rate:  77 PR Interval:  154 QRS Duration: 87 QT Interval:  387 QTC Calculation: 438 R Axis:   59 Text Interpretation:  Sinus rhythm agree. no acute ischemic changes. no  change Confirmed by Johnney Killian, MD, Jeannie Done [phone]) on 12/24/2015 4:06:36 PM      MDM   Final diagnoses:  Nausea  Lower abdominal pain    Patient's nausea and the pain improved here in the emergency department. She has no vomiting, no  diarrhea. All while here. She had a CT scan was negative and we will not repeat that today. Labs show a leukocytosis without significant lab abnormalities. The patient is seen in shared visit with Dr. Johnney Killian. Patient likely has viral infection. I doubt diverticulitis. If she continues to have pain, we'll have her follow up with her PCP. She may need an OB/GYN referral. I doubt any emergent cause of her abdominal pain today. EKG without ischemic changes, no significant changes from prior. Negative troponin. Patient appears safe for discharge at this time.    Margarita Mail, PA-C 12/24/15 [place], MD 01/06/16 [phone]

## 2015-12-24 NOTE — ED Notes (Signed)
Pt requesting something for nausea other than zofran and meclizine as they have tried both at home without relief.

## 2015-12-24 NOTE — ED Notes (Signed)
Pt reports developing abdominal pain and vomtiing 1 week ago wed, was given iron infusion on Monday and thought she would feel better, pt did not feel better, husband took pt to [place] on 4/26 and she was diagnosed with gastroenteritis, today presents with worsening abdominal pain and nausea, and left sided flank pain

## 2015-12-27 ENCOUNTER — Ambulatory Visit: Payer: Medicare Other

## 2015-12-29 ENCOUNTER — Other Ambulatory Visit: Payer: Self-pay | Admitting: Family Medicine

## 2015-12-29 ENCOUNTER — Other Ambulatory Visit: Payer: Medicare Other

## 2015-12-29 DIAGNOSIS — R911 Solitary pulmonary nodule: Secondary | ICD-10-CM

## 2016-01-18 ENCOUNTER — Other Ambulatory Visit (HOSPITAL_COMMUNITY): Payer: Self-pay | Admitting: Gastroenterology

## 2016-01-18 DIAGNOSIS — R1013 Epigastric pain: Secondary | ICD-10-CM

## 2016-01-18 DIAGNOSIS — R112 Nausea with vomiting, unspecified: Secondary | ICD-10-CM

## 2016-01-27 ENCOUNTER — Encounter (HOSPITAL_COMMUNITY)
Admission: RE | Admit: 2016-01-27 | Discharge: 2016-01-27 | Disposition: A | Discharge status: Home / Self Care | Payer: Medicare Other | Source: Ambulatory Visit | Attending: Gastroenterology | Admitting: Gastroenterology

## 2016-01-27 DIAGNOSIS — R1013 Epigastric pain: Secondary | ICD-10-CM | POA: Diagnosis present

## 2016-01-27 DIAGNOSIS — R112 Nausea with vomiting, unspecified: Secondary | ICD-10-CM | POA: Diagnosis not present

## 2016-01-27 MED ORDER — TECHNETIUM TC 99M SULFUR COLLOID FILTERED
2.2000 | Freq: Once | INTRAVENOUS | Status: AC | PRN
  Administered 2016-01-27: 2.2 via INTRADERMAL

## 2016-02-05 ENCOUNTER — Encounter (HOSPITAL_COMMUNITY): Payer: Self-pay | Admitting: Emergency Medicine

## 2016-02-05 ENCOUNTER — Emergency Department (HOSPITAL_COMMUNITY)
Admission: EM | Admit: 2016-02-05 | Discharge: 2016-02-05 | Disposition: A | Discharge status: Home / Self Care | Payer: Medicare Other | Attending: Emergency Medicine | Admitting: Emergency Medicine

## 2016-02-05 DIAGNOSIS — E119 Type 2 diabetes mellitus without complications: Secondary | ICD-10-CM | POA: Diagnosis not present

## 2016-02-05 DIAGNOSIS — Z794 Long term (current) use of insulin: Secondary | ICD-10-CM | POA: Diagnosis not present

## 2016-02-05 DIAGNOSIS — K219 Gastro-esophageal reflux disease without esophagitis: Secondary | ICD-10-CM | POA: Insufficient documentation

## 2016-02-05 DIAGNOSIS — I10 Essential (primary) hypertension: Secondary | ICD-10-CM | POA: Diagnosis not present

## 2016-02-05 DIAGNOSIS — R112 Nausea with vomiting, unspecified: Secondary | ICD-10-CM

## 2016-02-05 DIAGNOSIS — J45909 Unspecified asthma, uncomplicated: Secondary | ICD-10-CM | POA: Insufficient documentation

## 2016-02-05 DIAGNOSIS — M1990 Unspecified osteoarthritis, unspecified site: Secondary | ICD-10-CM | POA: Insufficient documentation

## 2016-02-05 DIAGNOSIS — R1013 Epigastric pain: Secondary | ICD-10-CM | POA: Diagnosis present

## 2016-02-05 DIAGNOSIS — Z79899 Other long term (current) drug therapy: Secondary | ICD-10-CM | POA: Insufficient documentation

## 2016-02-05 LAB — CBC WITH DIFFERENTIAL/PLATELET
BASOS ABS: 0 10*3/uL (ref 0.0–0.1)
Basophils Relative: 0 %
EOS PCT: 1 %
Eosinophils Absolute: 0.1 10*3/uL (ref 0.0–0.7)
HEMATOCRIT: 37.1 % (ref 36.0–46.0)
HEMOGLOBIN: 11.9 g/dL — AB (ref 12.0–15.0)
LYMPHS ABS: 1.6 10*3/uL (ref 0.7–4.0)
LYMPHS PCT: 16 %
MCH: 26.6 pg (ref 26.0–34.0)
MCHC: 32.1 g/dL (ref 30.0–36.0)
MCV: 83 fL (ref 78.0–100.0)
Monocytes Absolute: 0.6 10*3/uL (ref 0.1–1.0)
Monocytes Relative: 6 %
NEUTROS ABS: 7.7 10*3/uL (ref 1.7–7.7)
Neutrophils Relative %: 77 %
Platelets: 266 10*3/uL (ref 150–400)
RBC: 4.47 MIL/uL (ref 3.87–5.11)
RDW: 17.8 % — ABNORMAL HIGH (ref 11.5–15.5)
WBC: 10 10*3/uL (ref 4.0–10.5)

## 2016-02-05 LAB — COMPREHENSIVE METABOLIC PANEL
ALBUMIN: 4 g/dL (ref 3.5–5.0)
ALT: 18 U/L (ref 14–54)
ANION GAP: 8 (ref 5–15)
AST: 29 U/L (ref 15–41)
Alkaline Phosphatase: 66 U/L (ref 38–126)
BILIRUBIN TOTAL: 0.9 mg/dL (ref 0.3–1.2)
BUN: 24 mg/dL — AB (ref 6–20)
CHLORIDE: 97 mmol/L — AB (ref 101–111)
CO2: 26 mmol/L (ref 22–32)
Calcium: 8.8 mg/dL — ABNORMAL LOW (ref 8.9–10.3)
Creatinine, Ser: 0.85 mg/dL (ref 0.44–1.00)
GFR calc Af Amer: >60 mL/min (ref >60)
Glucose, Bld: 182 mg/dL — ABNORMAL HIGH (ref 65–99)
POTASSIUM: 5 mmol/L (ref 3.5–5.1)
Sodium: 131 mmol/L — ABNORMAL LOW (ref 135–145)
TOTAL PROTEIN: 7 g/dL (ref 6.5–8.1)

## 2016-02-05 LAB — URINALYSIS, ROUTINE W REFLEX MICROSCOPIC
BILIRUBIN URINE: NEGATIVE
Glucose, UA: NEGATIVE mg/dL
Hgb urine dipstick: NEGATIVE
Ketones, ur: NEGATIVE mg/dL
LEUKOCYTES UA: NEGATIVE
NITRITE: NEGATIVE
PH: 7.5 (ref 5.0–8.0)
Protein, ur: NEGATIVE mg/dL
SPECIFIC GRAVITY, URINE: 1.017 (ref 1.005–1.030)

## 2016-02-05 LAB — TROPONIN I: Troponin I: <0.03 ng/mL (ref <0.031)

## 2016-02-05 LAB — LIPASE, BLOOD: Lipase: 17 U/L (ref 11–51)

## 2016-02-05 MED ORDER — DIPHENHYDRAMINE HCL 50 MG/ML IJ SOLN
25.0000 mg | Freq: Once | INTRAMUSCULAR | Status: AC
  Administered 2016-02-05: 25 mg via INTRAVENOUS
  Filled 2016-02-05: qty 1

## 2016-02-05 MED ORDER — METOCLOPRAMIDE HCL 10 MG PO TABS: 10.0000 mg | ORAL_TABLET | Freq: Four times a day (QID) | ORAL | Status: DC

## 2016-02-05 MED ORDER — LORAZEPAM 2 MG/ML IJ SOLN
0.5000 mg | Freq: Once | INTRAMUSCULAR | Status: AC
  Administered 2016-02-05: 0.5 mg via INTRAVENOUS
  Filled 2016-02-05: qty 1

## 2016-02-05 MED ORDER — SODIUM CHLORIDE 0.9 % IV BOLUS (SEPSIS)
1000.0000 mL | Freq: Once | INTRAVENOUS | Status: AC
  Administered 2016-02-05: 1000 mL via INTRAVENOUS

## 2016-02-05 MED ORDER — METOCLOPRAMIDE HCL 5 MG/ML IJ SOLN
10.0000 mg | Freq: Once | INTRAMUSCULAR | Status: AC
  Administered 2016-02-05: 10 mg via INTRAVENOUS
  Filled 2016-02-05: qty 2

## 2016-02-05 MED ORDER — FAMOTIDINE 20 MG PO TABS: 20.0000 mg | ORAL_TABLET | Freq: Two times a day (BID) | ORAL | Status: DC

## 2016-02-05 MED ORDER — FAMOTIDINE IN NACL 20-0.9 MG/50ML-% IV SOLN
20.0000 mg | Freq: Once | INTRAVENOUS | Status: AC
  Administered 2016-02-05: 20 mg via INTRAVENOUS
  Filled 2016-02-05: qty 50

## 2016-02-05 MED ORDER — PROCHLORPERAZINE EDISYLATE 5 MG/ML IJ SOLN
10.0000 mg | Freq: Once | INTRAMUSCULAR | Status: AC
  Administered 2016-02-05: 10 mg via INTRAVENOUS
  Filled 2016-02-05: qty 2

## 2016-02-05 MED ORDER — LORAZEPAM 1 MG PO TABS: 1.0000 mg | ORAL_TABLET | Freq: Three times a day (TID) | ORAL | Status: DC | PRN

## 2016-02-05 NOTE — ED Notes (Signed)
Pt transported via from home by EMS for epigastric pain radiating up to chest, describes as burning, onset in April. Per EMS pt states pain began after having iron infusion in April. IV est by EMS #20 L hand. A & O.

## 2016-02-05 NOTE — ED Provider Notes (Signed)
CSN: [id number]     Arrival date & time 02/05/16  1927 History   First MD Initiated Contact with Patient 02/05/16 1943     Chief Complaint  Patient presents with  . Abdominal Pain   PT IS HERE WITH EPIGASTRIC ABDOMINAL PAIN AND NAUSEA.  PT HAS HAD THIS PROBLEM FOR MONTHS.  SHE HAS SEEN HER PCP AND GI.  THE PT SAID SHE HAD A CT ABD/PELVIS 2 WEEKS AGO HERE, BUT HER LAST VISIT HERE WAS IN April.  SHE DID HAVE AN UNREMARKABLE CT THEN.  THE PT SAID THAT SHE HAS BEEN ON NEXIUM AND ZOFRAN WITHOUT HELP.  PT HAD A GASTRIC EMPTYING TEST ON Thursday THE 8TH THAT PT SAID WAS NL.  (Consider location/radiation/quality/duration/timing/severity/associated sxs/prior Treatment) The history is provided by the patient and the spouse.    Past Medical History  Diagnosis Date  . Diabetes mellitus   . Hypertension   . Asthma   . S/P appy   . PONV (postoperative nausea and vomiting)   . Heart murmur   . GERD (gastroesophageal reflux disease)   . Arthritis    Past Surgical History  Procedure Laterality Date  . Appendectomy  1952  . Vaginal hysterectomy  1977  . Knee arthroscopy  2003 2005    right knee  . Mass fallopian tube    . Incontinence surgery    . Joint replacement Right   . Hand surgery Left 2010    operated on index and ring finger  . Spinal cord stimulator battery exchange N/A 09/09/2015    Procedure: SPINAL CORD STIMULATOR BATTERY EXCHANGE;  Surgeon: Melina Schools, MD;  Location: [place];  Service: Orthopedics;  Laterality: N/A;   Family History  Problem Relation Age of Onset  . Colon cancer Neg Hx   . Lung cancer Mother   . Heart attack Father    Social History  Substance Use Topics  . Smoking status: Never Smoker   . Smokeless tobacco: Never Used  . Alcohol Use: No   OB History    No data available     Review of Systems  Gastrointestinal: Positive for nausea, vomiting and abdominal pain.  All other systems reviewed and are negative.     Allergies  Vasotec; Codeine;  Lansoprazole; Morphine and related; Sulfate; Atorvastatin; Adhesive; and Scopolamine  Home Medications   Prior to Admission medications   Medication Sig Start Date End Date Taking? Authorizing Provider  albuterol (PROAIR HFA) 108 (90 BASE) MCG/ACT inhaler Inhale 2 puffs into the lungs every 4 (four) hours as needed for wheezing or shortness of breath. 02/02/15  Yes Collene Gobble, MD  albuterol (PROVENTIL) (2.5 MG/3ML) 0.083% nebulizer solution Take 3 mLs (2.5 mg total) by nebulization every 6 (six) hours as needed for wheezing or shortness of breath. 09/10/13  Yes Theodis Blaze, MD  ALPRAZolam Duanne Moron) 0.25 MG tablet Take 0.25 mg by mouth 2 (two) times daily as needed for anxiety.  01/31/16  Yes Historical Provider, MD  amLODipine (NORVASC) 10 MG tablet Take 10 mg by mouth daily.   Yes Historical Provider, MD  budesonide-formoterol (SYMBICORT) 160-4.5 MCG/ACT inhaler Inhale 2 puffs into the lungs 2 (two) times daily. Patient taking differently: Inhale 2 puffs into the lungs 2 (two) times daily as needed (Wheezing, cough).  02/02/15  Yes Collene Gobble, MD  esomeprazole (NEXIUM) 40 MG capsule Take 40 mg by mouth daily. 04/23/15  Yes Historical Provider, MD  gabapentin (NEURONTIN) 300 MG capsule Take 600 mg by mouth 3 (  three) times daily.    Yes Historical Provider, MD  Insulin Glargine (LANTUS SOLOSTAR [place]) Inject 50 Units into the skin at bedtime.    Yes Historical Provider, MD  insulin lispro (HUMALOG) 100 UNIT/ML SOCT Inject 22-28 Units into the skin 3 (three) times daily with meals. Sliding scale   Yes Historical Provider, MD  losartan (COZAAR) 100 MG tablet Take 100 mg by mouth daily.   Yes Historical Provider, MD  metoprolol (LOPRESSOR) 50 MG tablet Take 75 mg by mouth 2 (two) times daily.   Yes Historical Provider, MD  Multiple Vitamins-Minerals (PRESERVISION AREDS 2 PO) Take 1 tablet by mouth 2 (two) times daily.    Yes Historical Provider, MD  ondansetron (ZOFRAN) 4 MG tablet Take 1 tablet (4 mg  total) by mouth every 6 (six) hours. 12/22/15  Yes Nat Christen, MD  Pitavastatin Calcium (LIVALO) 1 MG TABS Take 1 mg by mouth at bedtime.   Yes Historical Provider, MD  promethazine (PHENERGAN) 25 MG tablet Take 1 tablet (25 mg total) by mouth every 6 (six) hours as needed for nausea or vomiting. 12/24/15  Yes Charlesetta Shanks, MD  triamterene-hydrochlorothiazide (DYAZIDE) 37.5-25 MG per capsule Take 2 capsules by mouth daily after lunch.    Yes Historical Provider, MD  diphenoxylate-atropine (LOMOTIL) 2.5-0.025 MG tablet Take 1 tablet by mouth 4 (four) times daily as needed for diarrhea or loose stools. Patient not taking: Reported on 02/05/2016 12/22/15   Nat Christen, MD  docusate sodium (COLACE) 100 MG capsule Take 1 capsule (100 mg total) by mouth every 12 (twelve) hours. Patient not taking: Reported on 02/05/2016 12/24/15   Margarita Mail, PA-C  famotidine (PEPCID) 20 MG tablet Take 1 tablet (20 mg total) by mouth 2 (two) times daily. 02/05/16   Isla Pence, MD  guaiFENesin-dextromethorphan [facility] DM) 100-10 MG/5ML syrup Take 5 mLs by mouth every 4 (four) hours as needed for cough. Patient not taking: Reported on 12/22/2015 09/10/13   Theodis Blaze, MD  HYDROcodone-acetaminophen [facility]) 5-325 MG tablet Take 1-2 tablets by mouth every 6 (six) hours as needed for moderate pain. Patient not taking: Reported on 02/05/2016 12/24/15   Margarita Mail, PA-C  LORazepam (ATIVAN) 1 MG tablet Take 1 tablet (1 mg total) by mouth 3 (three) times daily as needed for anxiety. 02/05/16   Isla Pence, MD  methocarbamol (ROBAXIN) 500 MG tablet Take 1 tablet (500 mg total) by mouth 3 (three) times daily as needed for muscle spasms. Patient not taking: Reported on 12/22/2015 09/09/15   Melina Schools, MD  metoCLOPramide (REGLAN) 10 MG tablet Take 1 tablet (10 mg total) by mouth every 6 (six) hours. 02/05/16   Isla Pence, MD   BP 173/80 mmHg  Pulse 78  Temp(Src) 98.5 F (36.9 C) (Oral)  Resp 20  Ht 5\' 4"  (1.626 m)   Wt 157 lb (71.215 kg)  BMI 26.94 kg/m2  SpO2 97% Physical Exam  Constitutional: She is oriented to person, place, and time. She appears well-developed and well-nourished.  HENT:  Head: Normocephalic and atraumatic.  Right Ear: External ear normal.  Left Ear: External ear normal.  Nose: Nose normal.  Mouth/Throat: Oropharynx is clear and moist.  Eyes: Conjunctivae and EOM are normal. Pupils are equal, round, and reactive to light.  Neck: Normal range of motion. Neck supple.  Cardiovascular: Normal rate, regular rhythm, normal heart sounds and intact distal pulses.   Pulmonary/Chest: Effort normal and breath sounds normal.  Abdominal: Soft. Bowel sounds are normal. There is tenderness in the  epigastric area.  Musculoskeletal: Normal range of motion.  Neurological: She is alert and oriented to person, place, and time.  Skin: Skin is warm and dry.  Psychiatric: She has a normal mood and affect. Her behavior is normal. Judgment and thought content normal.  Nursing note and vitals reviewed.   ED Course  Procedures (including critical care time) Labs Review Labs Reviewed  COMPREHENSIVE METABOLIC PANEL - Abnormal; Notable for the following:    Sodium 131 (*)    Chloride 97 (*)    Glucose, Bld 182 (*)    BUN 24 (*)    Calcium 8.8 (*)    All other components within normal limits  CBC WITH DIFFERENTIAL/PLATELET - Abnormal; Notable for the following:    Hemoglobin 11.9 (*)    RDW 17.8 (*)    All other components within normal limits  TROPONIN I  LIPASE, BLOOD  URINALYSIS, ROUTINE W REFLEX MICROSCOPIC (NOT AT [facility])    Imaging Review No results found. I have personally reviewed and evaluated these images and lab results as part of my medical decision-making.   EKG Interpretation   Date/Time:  Saturday February 05 2016 19:40:04 EDT Ventricular Rate:  81 PR Interval:  155 QRS Duration: 90 QT Interval:  392 QTC Calculation: 455 R Axis:   52 Text Interpretation:  Sinus rhythm  Confirmed by Lenisha Lacap MD, Lakindra Wible ([id number])  on 02/05/2016 7:55:31 PM      MDM  PT IS FEELING BETTER.  SHE KNOWS TO RETURN IF WORSE AND TO F/U WITH PCP. Final diagnoses:  Non-intractable vomiting with nausea, vomiting of unspecified type  Gastroesophageal reflux disease, esophagitis presence not specified        Isla Pence, MD 02/05/16 2232

## 2016-02-05 NOTE — ED Notes (Signed)
Bed: [id number] Expected date:  Expected time:  Means of arrival:  Comments: EMS 74yo F epigastric / chest pain

## 2016-02-05 NOTE — ED Notes (Signed)
Pt states nausea may be slightly better at this time. Pt does feel very anxious at this time.

## 2016-02-11 ENCOUNTER — Telehealth: Payer: Self-pay

## 2016-02-11 ENCOUNTER — Ambulatory Visit (INDEPENDENT_AMBULATORY_CARE_PROVIDER_SITE_OTHER): Payer: Medicare Other | Admitting: Neurology

## 2016-02-11 ENCOUNTER — Encounter: Payer: Self-pay | Admitting: Neurology

## 2016-02-11 VITALS — BP 168/80 | HR 72 | Ht 64.5 in | Wt 151.0 lb

## 2016-02-11 DIAGNOSIS — R519 Headache, unspecified: Secondary | ICD-10-CM

## 2016-02-11 DIAGNOSIS — R270 Ataxia, unspecified: Secondary | ICD-10-CM | POA: Diagnosis not present

## 2016-02-11 DIAGNOSIS — R51 Headache: Secondary | ICD-10-CM

## 2016-02-11 DIAGNOSIS — R110 Nausea: Secondary | ICD-10-CM | POA: Diagnosis not present

## 2016-02-11 DIAGNOSIS — R42 Dizziness and giddiness: Secondary | ICD-10-CM | POA: Diagnosis not present

## 2016-02-11 DIAGNOSIS — W19XXXA Unspecified fall, initial encounter: Secondary | ICD-10-CM

## 2016-02-11 MED ORDER — ALPRAZOLAM 0.5 MG PO TABS: 0.5000 mg | ORAL_TABLET | Freq: Three times a day (TID) | ORAL | Status: DC | PRN

## 2016-02-11 MED ORDER — ONDANSETRON 4 MG PO TBDP: ORAL_TABLET | ORAL | Status: DC

## 2016-02-11 NOTE — Progress Notes (Signed)
[facility]    Provider:  Dr Jaynee Eagles Referring Provider: Minna Merritts MD Primary Care Physician:  [name], MD  CC:  Dizziness and nausea  HPI:  [name] is a 75 y.o. female here as a referral from Dr. Ernesto Rutherford  for dizziness and nausea. PMHx DM. HTN, asthma, gerd. Per notes, she has reported epigastric abdominal pain and nausea for month, last in the ED 02/05/2016. She has seen her pcp and GI specialist and Dr. Ernesto Rutherford ENT. Nexium and zofran without relief. She was in the ED 12/22/2015 for nausea, vomiting, diarrhea for 1 week. She is here today, and she sometimes has burning in the epigastrium but not today, at times her abdomen hurts, she says her gallbladder has not been checked. She doesn't know what is going on. Dr. Ernesto Rutherford checked her ears for infections and hearing and no issues. She has lost 12 pounds. Her dizziness is like she has a cap on top of her head and someone is tightening the screws on it. It all started after an iron infusion treatment, she had to use a wheelchair to go home and she has been sick since then in the ED.The headaches have been on and off the whole time. Headaches daily. If she moves her head up and down or to shake it she gets dizzy. The dizziness is room spinning. She is falling, having a difficult time, she has to hold on to a wall, she feels like her head is spinning. She isn't able to walk. Multiple falls. Worse sitting up, it helps to bend over. No history of migraines. No history of significant headaches or migraines. The light bothers her, not sound sensitivity.  She is under a lot of stress, husband recent heart attack and son drug addict.    Reviewed notes, labs and imaging from outside physicians, which showed:  CT of the head 07/2008(personally reviewed images and agree with the following): Findings: The examination is limited because of motion artifact. The ventricles are in the midline without mass effect or  shift. They are normal in size and configuration. No extra-axial fluid collections are seen. There is periventricular white matter disease which is likely microvascular ischemic change. No definite acute intracranial findings and no mass lesions. The brainstem and cerebellum are grossly normal. The bony calvarium is intact and the visualized paranasal sinuses and mastoid air cells are clear.  IMPRESSION: Examination is limited but no definite acute intracranial findings.  CMP with elevated glucose 182, CBC mild anemia 11.9  Gastorc emptying study and CT abdomen and pelvis have been normal.    Review of Systems: Patient complains of symptoms per HPI as well as the following symptoms: nausea, vomiting. No fevers. She has chills, no dark stools. Pertinent negatives per HPI. All others negative.   Social History   Social History  . Marital Status: Married    Spouse Name: Charlotte Crumb  . Number of Children: 2  . Years of Education: 16   Occupational History  .      retired Pharmacist, hospital, IT sales professional   Social History Main Topics  . Smoking status: Never Smoker   . Smokeless tobacco: Never Used  . Alcohol Use: No  . Drug Use: No  . Sexual Activity: No   Other Topics Concern  . Not on file   Social History Narrative   Lives with husband   Caffeine use- none    Family History  Problem Relation Age of Onset  . Colon cancer Neg Hx   .  Stroke Neg Hx   . Migraines Neg Hx   . Lung cancer Mother   . Heart attack Father     Past Medical History  Diagnosis Date  . Diabetes mellitus   . Hypertension   . Asthma   . S/P appy   . PONV (postoperative nausea and vomiting)   . Heart murmur   . GERD (gastroesophageal reflux disease)   . Arthritis     Past Surgical History  Procedure Laterality Date  . Appendectomy  1952  . Vaginal hysterectomy  1977  . Knee arthroscopy  2003 2005    right knee  . Mass fallopian tube    . Incontinence surgery    . Joint replacement Right     . Hand surgery Left 2010    operated on index and ring finger  . Spinal cord stimulator battery exchange N/A 09/09/2015    Procedure: SPINAL CORD STIMULATOR BATTERY EXCHANGE;  Surgeon: Melina Schools, MD;  Location: [facility];  Service: Orthopedics;  Laterality: N/A;    Current Outpatient Prescriptions  Medication Sig Dispense Refill  . albuterol (PROAIR HFA) 108 (90 BASE) MCG/ACT inhaler Inhale 2 puffs into the lungs every 4 (four) hours as needed for wheezing or shortness of breath. 1 Inhaler 3  . albuterol (PROVENTIL) (2.5 MG/3ML) 0.083% nebulizer solution Take 3 mLs (2.5 mg total) by nebulization every 6 (six) hours as needed for wheezing or shortness of breath. 75 mL 12  . amLODipine (NORVASC) 10 MG tablet Take 10 mg by mouth daily.    . budesonide-formoterol (SYMBICORT) 160-4.5 MCG/ACT inhaler Inhale 2 puffs into the lungs 2 (two) times daily. (Patient taking differently: Inhale 2 puffs into the lungs 2 (two) times daily as needed (Wheezing, cough). ) 1 Inhaler 6  . diphenoxylate-atropine (LOMOTIL) 2.5-0.025 MG tablet Take 1 tablet by mouth 4 (four) times daily as needed for diarrhea or loose stools. 15 tablet 0  . docusate sodium (COLACE) 100 MG capsule Take 1 capsule (100 mg total) by mouth every 12 (twelve) hours. 30 capsule 0  . esomeprazole (NEXIUM) 40 MG capsule Take 40 mg by mouth daily.  0  . famotidine (PEPCID) 20 MG tablet Take 1 tablet (20 mg total) by mouth 2 (two) times daily. 30 tablet 0  . gabapentin (NEURONTIN) 300 MG capsule Take 600 mg by mouth 3 (three) times daily.     Marland Kitchen HYDROcodone-acetaminophen (NORCO) 5-325 MG tablet Take 1-2 tablets by mouth every 6 (six) hours as needed for moderate pain. 20 tablet 0  . Insulin Glargine (LANTUS SOLOSTAR [place]) Inject 50 Units into the skin at bedtime.     . insulin lispro (HUMALOG) 100 UNIT/ML SOCT Inject 22-28 Units into the skin 3 (three) times daily with meals. Sliding scale    . LORazepam (ATIVAN) 1 MG tablet Take 1 tablet (1 mg total)  by mouth 3 (three) times daily as needed for anxiety. 15 tablet 0  . losartan (COZAAR) 100 MG tablet Take 100 mg by mouth daily.    . meclizine (ANTIVERT) 25 MG tablet 25 mg. As needed for nausea  0  . methocarbamol (ROBAXIN) 500 MG tablet Take 1 tablet (500 mg total) by mouth 3 (three) times daily as needed for muscle spasms. 60 tablet 0  . metoCLOPramide (REGLAN) 10 MG tablet Take 1 tablet (10 mg total) by mouth every 6 (six) hours. 30 tablet 0  . metoprolol (LOPRESSOR) 50 MG tablet Take 75 mg by mouth 2 (two) times daily.    . Multiple  Vitamins-Minerals (PRESERVISION AREDS 2 PO) Take 1 tablet by mouth 2 (two) times daily.     . ondansetron (ZOFRAN) 4 MG tablet Take 1 tablet (4 mg total) by mouth every 6 (six) hours. 12 tablet 0  . Pitavastatin Calcium (LIVALO) 1 MG TABS Take 1 mg by mouth at bedtime.    . promethazine (PHENERGAN) 25 MG tablet Take 1 tablet (25 mg total) by mouth every 6 (six) hours as needed for nausea or vomiting. 20 tablet 0  . triamterene-hydrochlorothiazide (DYAZIDE) 37.5-25 MG per capsule Take 2 capsules by mouth daily after lunch.     . ALPRAZolam (XANAX) 0.5 MG tablet Take 1 tablet (0.5 mg total) by mouth 3 (three) times daily as needed for anxiety. Start with 1/2 pill. Watch for sedation do not drive. 60 tablet 3  . guaiFENesin-dextromethorphan (ROBITUSSIN DM) 100-10 MG/5ML syrup Take 5 mLs by mouth every 4 (four) hours as needed for cough. (Patient not taking: Reported on 02/11/2016) 118 mL 1   No current facility-administered medications for this visit.    Allergies as of 02/11/2016 - Review Complete 02/11/2016  Allergen Reaction Noted  . Vasotec Shortness Of Breath and Other (See Comments) 03/21/2011  . Codeine Nausea And Vomiting 03/21/2011  . Lansoprazole Itching, Rash, and Other (See Comments) 03/21/2011  . Morphine and related Itching 03/21/2011  . Sulfate Itching 03/21/2011  . Atorvastatin Other (See Comments) 12/22/2015  . Adhesive [tape] Rash 12/22/2015    . Scopolamine Rash 12/22/2015    Vitals: BP 168/80 mmHg  Pulse 72  Ht 5' 4.5" (1.638 m)  Wt 151 lb (68.493 kg)  BMI 25.53 kg/m2 Last Weight:  Wt Readings from Last 1 Encounters:  02/11/16 151 lb (68.493 kg)   Last Height:   Ht Readings from Last 1 Encounters:  02/11/16 5' 4.5" (1.638 m)   Physical exam: Exam: Gen: Patient in distress, holding her stomach              CV: RRR, no MRG. No Carotid Bruits. No peripheral edema, warm, nontender Eyes: Conjunctivae clear without exudates or hemorrhage  Neuro: Detailed Neurologic Exam  Speech:    Speech is normal; fluent and spontaneous with normal comprehension.  Cognition:    The patient is oriented to person, place, and time;     recent and remote memory intact;     language fluent;     normal attention, concentration,     fund of knowledge Cranial Nerves:    The pupils are equal, round, and reactive to light. Attempted fundoscopic exam, could not visualize due to patient non cooperative. . Visual fields are full to finger confrontation. Extraocular movements are intact. Trigeminal sensation is intact and the muscles of mastication are normal. The face is symmetric. The palate elevates in the midline. Hearing intact. Voice is normal. Shoulder shrug is normal. The tongue has normal motion without fasciculations.   Coordination:    Normal finger to nose and heel to shin.   Gait:  off-balance, stumbles  Motor Observation:    No asymmetry, no atrophy, and no involuntary movements noted. Tone:    Normal muscle tone.    Posture:    Posture is normal. normal erect    Strength: Poor effort, but appears all are symmetric and intact.     Sensation: intact to LT     Reflex Exam:  DTR's: Absent As otherwise deep tendon reflexes in the upper and lower extremities are brisk for age and medical conditions bilaterally.   Toes:  The toes are downgoing bilaterally.   Clonus:    Clonus is absent.       Assessment/Plan:   a 75 y.o. female here as a referral from Dr. Ernesto Rutherford  for dizziness and nausea. PMHx DM. HTN, asthma, gerd.  She is in distress today, bent over holding her stomach, eating ice chips.2 months of headache, dizziness, nausea, diarrhea, abdominal pain. She has been extensively worked up by GI, pcp and ENT without etiology. Will order MRI of the brain. Will give fluids today and anti-nausea IV.Xanax bid, excellent vestibular suppressant and may help with stress ( Is under a lot of stress, husband recent heart attack and son drug addict).Offered her imaging today, highly recommended to ensure no intracranial pathology, she declined today so will schedule MRi another day (GI imaging had open appt today but she declined).  Sarina Ill, MD  [facility] [address] [place], [place] [address]  Phone [phone] Fax [phone]

## 2016-02-11 NOTE — Patient Instructions (Signed)
Remember to drink plenty of fluid, eat healthy meals and do not skip any meals. Try to eat protein with a every meal and eat a healthy snack such as fruit or nuts in between meals. Try to keep a regular sleep-wake schedule and try to exercise daily, particularly in the form of walking, 20-30 minutes a day, if you can.   As far as diagnostic testing: MRI brain  Our phone number is [phone]. We also have an after hours call service for urgent matters and there is a physician on-call for urgent questions. For any emergencies you know to call 911 or go to the nearest emergency room

## 2016-02-11 NOTE — Telephone Encounter (Signed)
Faxed to [facility] # [phone]. Hard copy given to pt per Dr. Jaynee Eagles.

## 2016-02-15 ENCOUNTER — Telehealth: Payer: Self-pay | Admitting: Neurology

## 2016-02-15 NOTE — Telephone Encounter (Signed)
Patient called, said she needs to know where she goes for MRI and what time. Her son did not convey that to her.

## 2016-02-15 NOTE — Telephone Encounter (Signed)
I spoke with the patient and informed her that her MRI had been sent to [place].

## 2016-02-22 ENCOUNTER — Ambulatory Visit
Admission: RE | Admit: 2016-02-22 | Discharge: 2016-02-22 | Disposition: A | Discharge status: Home / Self Care | Payer: Medicare Other | Source: Ambulatory Visit | Attending: Neurology | Admitting: Neurology

## 2016-02-22 ENCOUNTER — Other Ambulatory Visit: Payer: Self-pay | Admitting: Neurology

## 2016-02-22 DIAGNOSIS — R51 Headache: Secondary | ICD-10-CM

## 2016-02-22 DIAGNOSIS — R519 Headache, unspecified: Secondary | ICD-10-CM

## 2016-02-22 DIAGNOSIS — R1115 Cyclical vomiting syndrome unrelated to migraine: Secondary | ICD-10-CM

## 2016-02-22 DIAGNOSIS — R42 Dizziness and giddiness: Secondary | ICD-10-CM

## 2016-02-22 DIAGNOSIS — R270 Ataxia, unspecified: Secondary | ICD-10-CM

## 2016-02-22 DIAGNOSIS — R110 Nausea: Secondary | ICD-10-CM

## 2016-02-22 DIAGNOSIS — W19XXXA Unspecified fall, initial encounter: Secondary | ICD-10-CM

## 2016-02-22 MED ORDER — IOPAMIDOL (ISOVUE-300) INJECTION 61%
75.0000 mL | Freq: Once | INTRAVENOUS | Status: AC | PRN
  Administered 2016-02-22: 75 mL via INTRAVENOUS

## 2016-02-22 NOTE — Telephone Encounter (Signed)
[facility] [phone] called with a STAT CT report (was changed from MRI, patient couldn't have MRI). No evidence of acute intracranial abnormality or mass. Moderate chronic small vessel ischemic disease. Please call to advise if patient can leave or needs to wait.

## 2016-02-22 NOTE — Telephone Encounter (Signed)
Rn call and spoke with Santiago Glad at [facility]. Rn stated Dr. Jaynee Eagles was spoken to about patient. Per Dr. Jaynee Eagles pt can leave [facility] because of the results. Santiago Glad verbalized understanding and will tell patient.

## 2016-02-23 ENCOUNTER — Other Ambulatory Visit: Payer: Self-pay | Admitting: Neurology

## 2016-02-23 ENCOUNTER — Telehealth: Payer: Self-pay | Admitting: *Deleted

## 2016-02-23 DIAGNOSIS — R42 Dizziness and giddiness: Secondary | ICD-10-CM

## 2016-02-23 NOTE — Telephone Encounter (Signed)
She has chronic dizziness and vertigo. She is feeling better with the zofran and benzo. Will refer to vestibular therapy.

## 2016-02-23 NOTE — Telephone Encounter (Signed)
-----   Message from Melvenia Beam, MD sent at 02/22/2016  4:33 PM EDT ----- emmma - No evidence of acute intracranial abnormality or mass.No intracranial explanation for her symptoms.

## 2016-02-23 NOTE — Telephone Encounter (Signed)
Dr [name]- please advise. No f/u scheduled. I do not see mention of f/u in OV note.   Called pt. Relayed results per Dr [name] note. She verbalized understanding. Patient wants to know what to do from here. She states "something has to be done. I am still having my sx" Pt states she is taking xanax and zofran as prescirbed. She states the xanax only helps for about 5-6 hours and sx return. The zofran only helps for about 3-4 hr and the nausea returns. Advised I will speak to Dr [name] and call her back to advise. She verbalized understanding.

## 2016-02-25 NOTE — Telephone Encounter (Signed)
Patient is calling back stating she is in so much pain in the side of her back and is extremely dizzy. She can only get up if her husband is helping her. Dr. Jaynee Eagles referred her for therapy but an appointment has not been set up yet. Pllease call to discuss because the patient does not know what to do.

## 2016-02-25 NOTE — Telephone Encounter (Signed)
Rn call patient about her vertigo and dizzy spells. RN stated Dr. Jaynee Eagles referred her to outpatient therapy for vertigo. Pt stated she fell on Sunday after she saw Dr. Jaynee Eagles. Rn stated per Dr. Jaynee Eagles she did not assess her for back pain at the last visit. Rn stated per Dr. Jaynee Eagles the therapy will help her vertigo and that's her recommendation. RN stated to pt to seek urgent care or her PCP for her fall and the back pain. Pt verbalized understanding.

## 2016-02-28 ENCOUNTER — Other Ambulatory Visit: Payer: Self-pay | Admitting: *Deleted

## 2016-02-28 DIAGNOSIS — R42 Dizziness and giddiness: Secondary | ICD-10-CM

## 2016-03-03 ENCOUNTER — Encounter: Payer: Self-pay | Admitting: Physical Therapy

## 2016-03-03 ENCOUNTER — Ambulatory Visit: Payer: Medicare Other | Attending: Diagnostic Neuroimaging | Admitting: Physical Therapy

## 2016-03-03 DIAGNOSIS — R2681 Unsteadiness on feet: Secondary | ICD-10-CM | POA: Insufficient documentation

## 2016-03-03 DIAGNOSIS — R2689 Other abnormalities of gait and mobility: Secondary | ICD-10-CM | POA: Diagnosis present

## 2016-03-03 DIAGNOSIS — H8112 Benign paroxysmal vertigo, left ear: Secondary | ICD-10-CM | POA: Insufficient documentation

## 2016-03-03 DIAGNOSIS — R42 Dizziness and giddiness: Secondary | ICD-10-CM | POA: Insufficient documentation

## 2016-03-03 NOTE — Patient Instructions (Signed)
[place], try sleeping on your left side at night time.  Tip Card 1.The goal of habituation training is to assist in decreasing symptoms of vertigo, dizziness, or nausea provoked by specific head and body motions. If you have difficulty tolerating this exercise, you may consider taking nausea medication first. 2.These exercises may initially increase symptoms; however, be persistent and work through symptoms. With repetition and time, the exercises will assist in reducing or eliminating symptoms. 3.Exercises should be stopped and discussed with the therapist if you experience any of the following: - Sudden change or fluctuation in hearing - New onset of ringing in the ears, or increase in current intensity - Any fluid discharge from the ear - Severe pain in neck or back - Extreme nausea  Copyright  VHI. All rights reserved.  Rolling   With pillow under head, start on back. Roll quickly to your right side. Hold until dizziness stops, plus 20 seconds and then roll quickly to the left side. Hold until dizziness stops, plus 20 seconds. Repeat sequence 5 times per session. Do 2 sessions per day.

## 2016-03-03 NOTE — Therapy (Addendum)
[facility] [address] [place], [place], [address] Phone: [phone]   Fax:  [phone]  Physical Therapy Evaluation  Patient Details  Name: [name] MRN: [id number] Date of Birth: 01/17/1941 Referring Provider: Sarina Ill, MD  Encounter Date: 03/03/2016      PT End of Session - 03/03/16 0903    Visit Number 1   Number of Visits 9   Date for PT Re-Evaluation 05/02/16   Authorization Type UHC MCR   Authorization Time Period G Codes required   PT Start Time 0802   PT Stop Time 0846   PT Time Calculation (min) 44 min   Activity Tolerance Other (comment)  Pt limited by nausea   Behavior During Therapy [facility] for tasks assessed/performed      Past Medical History  Diagnosis Date  . Diabetes mellitus   . Hypertension   . Asthma   . S/P appy   . PONV (postoperative nausea and vomiting)   . Heart murmur   . GERD (gastroesophageal reflux disease)   . Arthritis     Past Surgical History  Procedure Laterality Date  . Appendectomy  1952  . Vaginal hysterectomy  1977  . Knee arthroscopy  2003 2005    right knee  . Mass fallopian tube    . Incontinence surgery    . Joint replacement Right   . Hand surgery Left 2010    operated on index and ring finger  . Spinal cord stimulator battery exchange N/A 09/09/2015    Procedure: SPINAL CORD STIMULATOR BATTERY EXCHANGE;  Surgeon: Melina Schools, MD;  Location: [facility];  Service: Orthopedics;  Laterality: N/A;    There were no vitals filed for this visit.       Subjective Assessment - 03/03/16 0806    Subjective "I've been dizzy since April when I had an iron infusion. I had to be taken out in a wheelchair. When it first started, I was only dizzy for the first hour or two; now it's progressed so that I'm dizzy for most of the day."   Pertinent History PMH significant for: DM, HTN, asthma, GERD, B cataract extraction, depression.   Diagnostic tests CT of head  (02/23/26): No evidence of acute intracranial abnormality or mass. Moderate chronic small vessel ischemic disease.   Patient Stated Goals "I want to get rid of this dizziness and nausea."   Currently in Pain? No/denies            [facility] PT Assessment - 03/03/16 0001    Assessment   Medical Diagnosis Dizziness; worsening headache; intractable cyclic vomiting with nausea   Referring Provider Sarina Ill, MD   Onset Date/Surgical Date 12/11/15   Prior Therapy "I think I've been here for vertigo before"   Precautions   Precautions Fall   Restrictions   Weight Bearing Restrictions No   Balance Screen   Has the patient fallen in the past 6 months Yes   How many times? 4  all in the past month   Has the patient had a decrease in activity level because of a fear of falling?  Yes   Is the patient reluctant to leave their home because of a fear of falling?  Yes   [place] Private residence   Living Arrangements Spouse/significant other   Type of [place] to enter   Entrance Stairs-Number of Steps 2   Entrance Stairs-Rails None   Home  Layout One level   [place] - single point   Prior Function   Level of Independence Independent   Vocation Retired   Leisure Likes to read and Engineer, production cards.            Vestibular Assessment - 03/03/16 0001    Vestibular Assessment   General Observation "feels like someone has put a cap on my head and tightened the screws on it."   Symptom Behavior   Type of Dizziness Imbalance  visual blurring when looking down in morning   Frequency of Dizziness daily; every morning   Duration of Dizziness visual changes last 20 minutes in morning; imbalance lasts all day   Aggravating Factors Mornings;Turning head quickly   Relieving Factors Lying supine   Occulomotor Exam   Occulomotor Alignment Normal   Smooth Pursuits Intact   Saccades Intact  hypometric but grossly normal    Comment Symptomati Head Thrust Test; however, difficult to visualize corrective saccade due to pt closing eyes tightly.   Positional Testing   Sidelying Test Sidelying Right;Sidelying Left   Horizontal Canal Testing Horizontal Canal Right;Horizontal Canal Left;Horizontal Canal Right Intensity;Horizontal Canal Left Intensity   Sidelying Right   Sidelying Right Duration NA   Sidelying Right Symptoms Upbeat, right rotatory nystagmus;No nystagmus   Sidelying Left   Sidelying Left Duration NA   Sidelying Left Symptoms No nystagmus   Horizontal Canal Right   Horizontal Canal Right Duration approx 15 seconds   Horizontal Canal Right Symptoms Ageotrophic;Nystagmus   Horizontal Canal Left   Horizontal Canal Left Duration approx 5 seconds   Horizontal Canal Left Symptoms Ageotrophic;Nystagmus   Horizontal Canal Left Intensity   Horizontal Canal Right Intensity Mild   Horizontal Canal Right Intensity   Horizontal Canal Left Intensity Moderate   Left Intensity Comment Severe nausea               [facility] Adult PT Treatment/Exercise - 03/03/16 0001    Transfers   Transfers Sit to Stand;Stand to Sit   Sit to Stand 4: Min guard   Stand to Sit 4: Min guard   Comments Requires min guard due to dizziness/disequilibrium   Ambulation/Gait   Ambulation/Gait Yes   Ambulation/Gait Assistance 4: Min assist   Ambulation/Gait Assistance Details Requires single HHA due to decreased gait stability (worse after bed mobility).   Ambulation Distance (Feet) 80 Feet  x2   Assistive device None   Gait Pattern Step-through pattern;Decreased arm swing - right;Decreased arm swing - left;Decreased stride length;Wide base of support  UE's in high guard position; turns en bloc   Ambulation Surface Level;Indoor         Vestibular Treatment/Exercise - 03/03/16 0001    Vestibular Treatment/Exercise   Vestibular Treatment Provided Canalith Repositioning;Habituation   Canalith Repositioning Comment    Habituation Exercises Horizontal Roll   OTHER   Comment Performed L Gufoni maneuver x2 trials. Reassessment of horizontal canals reveals no change (ageotropic nystagmus); therefore, educated pt on fast rolling for HC habituation.   Horizontal Roll   Number of Reps  3   Symptom Description  fast rolling; cueing for techinque               PT Education - 03/03/16 0856    Education provided Yes   Education Details PT eval findings, goals, and POC. Explained nature of BPPV and what to expect after this session. HEP for fast rolling initiated. Education for prolonged positioning. Recommended pt use RW at this time due  to significant gait instability.   Person(s) Educated Patient   Methods Demonstration;Explanation;Handout;Verbal cues   Comprehension Verbalized understanding;Returned demonstration          PT Short Term Goals - 03/03/16 0905    PT SHORT TERM GOAL #1   Title STG's = LTG's           PT Long Term Goals - 03/03/16 0906    PT LONG TERM GOAL #1   Title Pt will independently perform vestibular HEP to maximize functional gains made in PT.  (Target date: 03/31/16)   PT LONG TERM GOAL #2   Title Positional vertigo testing will be negative to indicate resolved BPPV.    PT LONG TERM GOAL #3   Title Pt will decrease DHI score from 100 to 82 to indicate significant decrease in pt-perceived disability due to dizziness.    PT LONG TERM GOAL #4   Title Assess strength and balance, if indicated, after vertigo clears.                Plan - 03/03/16 0908    Clinical Impression Statement Pt is a 75 y/o F referred to outpatient PT to address functional impairments associated with dizziness and nausea.  PMH significant for: DM, HTN, asthma, GERD, B cataract extraction, depression. PT evaluation reveals the following: gait instability; impaired VOR; horizontal canal testing with ageotropic nystagmus (severity of symptoms, duration of nystagmus on R > L). Therefore, unable to  rule out L horizontal canal cupulolithiasis. Performed L Gufoni maneuver x2 trials, with no change in presentation upon reassessment of horizontal canals. Therefore, educated pt on fast rolling for HC habituation. Pt will benefit from skilled outpatient PT 2x/week for 4 weeks to address said impairments.    Rehab Potential Good   PT Frequency 2x / week   PT Duration 4 weeks   PT Treatment/Interventions ADLs/Self Care Home Management;Canalith Repostioning;Vestibular;Gait training;Neuromuscular re-education;Stair training;Functional mobility training;Therapeutic activities;Patient/family education;Therapeutic exercise;Balance training   PT Next Visit Plan Reassess for BPPV (horizontal canals) and treat prn.    Consulted and Agree with Plan of Care Patient      Patient will benefit from skilled therapeutic intervention in order to improve the following deficits and impairments:  Abnormal gait, Dizziness, Decreased balance  Visit Diagnosis: BPPV (benign paroxysmal positional vertigo), left - Plan: PT plan of care cert/re-cert  Dizziness and giddiness - Plan: PT plan of care cert/re-cert  Unsteadiness on feet - Plan: PT plan of care cert/re-cert  Other abnormalities of gait and mobility - Plan: PT plan of care cert/re-cert      G-Codes - [id number]    Functional Assessment Tool Used DHI = 100   Functional Limitation Self care   Self Care Current Status [id number]) At least 80 percent but less than 100 percent impaired, limited or restricted   Self Care Goal Status [id number]) At least 60 percent but less than 80 percent impaired, limited or restricted       Problem List Patient Active Problem List   Diagnosis Date Noted  . Iron deficiency anemia 12/15/2015  . Acute GI bleeding 05/13/2015  . Acute blood loss anemia 05/13/2015  . Normocytic anemia 05/13/2015  . Allergic rhinitis 04/06/2015  . Asthma, chronic 09/08/2013  . Hypoxia 09/08/2013  . HTN (hypertension) 09/08/2013  .  Depression 09/08/2013  . DM type 2 with diabetic peripheral neuropathy ([place]) 09/08/2013  . Flu-like symptoms 09/07/2013    Billie Ruddy, PT, DPT [facility] [phone]  [place], [place], [address] Phone: [phone]   Fax:  [phone] 03/03/2016, 9:15 AM  Name: [name] MRN: [id number] Date of Birth: 1941/08/24

## 2016-03-06 ENCOUNTER — Ambulatory Visit: Payer: Medicare Other | Admitting: Physical Therapy

## 2016-03-06 DIAGNOSIS — H8112 Benign paroxysmal vertigo, left ear: Secondary | ICD-10-CM

## 2016-03-06 DIAGNOSIS — R2681 Unsteadiness on feet: Secondary | ICD-10-CM

## 2016-03-06 DIAGNOSIS — R42 Dizziness and giddiness: Secondary | ICD-10-CM

## 2016-03-06 DIAGNOSIS — R2689 Other abnormalities of gait and mobility: Secondary | ICD-10-CM

## 2016-03-06 NOTE — Therapy (Signed)
[facility] [address] [place], [place], [address] Phone: [phone]   Fax:  [phone]  Physical Therapy Treatment  Patient Details  Name: [name] MRN: [id number] Date of Birth: 19-Aug-1941 Referring Provider: Sarina Ill, MD  Encounter Date: 03/06/2016      PT End of Session - 03/06/16 0811    Visit Number 2   Number of Visits 9   Date for PT Re-Evaluation 05/02/16   Authorization Type UHC MCR   Authorization Time Period G Codes required   PT Start Time 0759   PT Stop Time 0843   PT Time Calculation (min) 44 min   Activity Tolerance Patient tolerated treatment well   Behavior During Therapy [facility] for tasks assessed/performed      Past Medical History  Diagnosis Date  . Diabetes mellitus   . Hypertension   . Asthma   . S/P appy   . PONV (postoperative nausea and vomiting)   . Heart murmur   . GERD (gastroesophageal reflux disease)   . Arthritis     Past Surgical History  Procedure Laterality Date  . Appendectomy  1952  . Vaginal hysterectomy  1977  . Knee arthroscopy  2003 2005    right knee  . Mass fallopian tube    . Incontinence surgery    . Joint replacement Right   . Hand surgery Left 2010    operated on index and ring finger  . Spinal cord stimulator battery exchange N/A 09/09/2015    Procedure: SPINAL CORD STIMULATOR BATTERY EXCHANGE;  Surgeon: Melina Schools, MD;  Location: [facility];  Service: Orthopedics;  Laterality: N/A;    There were no vitals filed for this visit.      Subjective Assessment - 03/06/16 0759    Subjective "I had a pretty good day yesterday, but this morning I'm really dizzy. Mornings are usually worse. I tried the exercises, but felt so sick, I couldn't finish. I took medicine for nausea before coming today. My husband made my buy this cane."   Pertinent History PMH significant for: DM, HTN, asthma, GERD, B cataract extraction, depression.   Diagnostic tests  CT of head (02/23/26): No evidence of acute intracranial abnormality or mass. Moderate chronic small vessel ischemic disease.   Patient Stated Goals "I want to get rid of this dizziness and nausea."   Currently in Pain? No/denies                Vestibular Assessment - 03/06/16 0001    Positional Testing   Horizontal Canal Testing Horizontal Canal Right;Horizontal Canal Left;Horizontal Canal Left Intensity;Horizontal Canal Right Intensity   Sidelying Right   Sidelying Right Duration NA   Sidelying Right Symptoms No nystagmus   Sidelying Left   Sidelying Left Duration NA   Sidelying Left Symptoms No nystagmus   Horizontal Canal Right   Horizontal Canal Right Duration Approx. 10 seconds   Horizontal Canal Right Symptoms Ageotrophic;Nystagmus   Horizontal Canal Left   Horizontal Canal Left Duration NA   Horizontal Canal Left Symptoms Normal   Horizontal Canal Right Intensity   Horizontal Canal Right Intensity Moderate   Horizontal Canal Left Intensity   Left Intensity Comment NA; no symptoms to L side   Orthostatics   BP supine (x 5 minutes) 152/70 mmHg   HR supine (x 5 minutes) 76   BP standing (after 1 minute) 155/73 mmHg   HR standing (after 1 minute) 81   BP standing (after 3 minutes) 166/73  mmHg   HR standing (after 3 minutes) 82   Orthostatics Comment Pt has not taken BP medication today, as she typically takes medication at 10 am.                 [facility] Adult PT Treatment/Exercise - 03/06/16 0001    Ambulation/Gait   Ambulation/Gait Yes   Ambulation/Gait Assistance 4: Min assist;5: Supervision   Ambulation/Gait Assistance Details Min A, HHA for gait when ambulating into clinic; (S) for ambulation when leaving session due to improved gait stability post-session.   Ambulation Distance (Feet) 75 Feet  x2   Assistive device Straight cane   Gait Pattern Step-through pattern;Decreased arm swing - right;Decreased arm swing - left;Decreased stride length;Wide base  of support  turns en bloc   Ambulation Surface Level;Indoor         Vestibular Treatment/Exercise - 03/06/16 0001    Vestibular Treatment/Exercise   Vestibular Treatment Provided Canalith Repositioning   Canalith Repositioning Comment;Canal Roll Left   Canal Roll Left   Number of Reps  1   Overall Response  Improved Symptoms   Response Details  Reassessment reveals no nystagmus, no symptoms.   OTHER   Comment Performed Apogeotropic Left LC Cupulolith Repositioning Maneuver as described by Liborio Nixon al (2012) with each position held x2 minutes and vibrator at L mastoid process for initial 30 seconds of positions 1 and 4. Upon reassessment of horizontal canals, unable to visualize any nystagmus (geotropic or ageotropic), but pt reports feeling "swimmy headed" with assessment in B directions.   Horizontal Roll   Number of Reps  2   Symptom Description  Reviewed rolling (removed fast component) x2 reps.                 PT Short Term Goals - 03/03/16 0905    PT SHORT TERM GOAL #1   Title STG's = LTG's           PT Long Term Goals - 03/03/16 0906    PT LONG TERM GOAL #1   Title Pt will independently perform vestibular HEP to maximize functional gains made in PT.  (Target date: 03/31/16)   PT LONG TERM GOAL #2   Title Positional vertigo testing will be negative to indicate resolved BPPV.    PT LONG TERM GOAL #3   Title Pt will decrease DHI score from 100 to 82 to indicate significant decrease in pt-perceived disability due to dizziness.    PT LONG TERM GOAL #4   Title Assess strength and balance, if indicated, after vertigo clears.                Plan - 03/06/16 [id number]    Clinical Impression Statement Reassessment of horizontal canals reveals ageotropic nystagmus accompanied by nausea on R side only (duration: 10 seconds). Therefore, performed L Cupulolith Repositioning Maneuver described by Liborio Nixon al [phone]) x1 maneuver. Reassessment of horizontal canals revealed no  nystagmus (geotropic or ageotropic) and pt-reported "swimmy headedness" with testing in B directions.Performed L Canal Roll maneuver x1. Reassessment of horizontal canals reveals no nystagmus, asymptomatic. Ruled out postural hypotensio, due to pt concern that low BP could be contributing to symptoms.    Rehab Potential Good   PT Frequency 2x / week   PT Duration 4 weeks   PT Treatment/Interventions ADLs/Self Care Home Management;Canalith Repostioning;Vestibular;Gait training;Neuromuscular re-education;Stair training;Functional mobility training;Therapeutic activities;Patient/family education;Therapeutic exercise;Balance training   PT Next Visit Plan Reassess for BPPV (horizontal canals) and treat prn. Initiate vestibular HEP (gaze stabilization,  corner balance).   Consulted and Agree with Plan of Care Patient      Patient will benefit from skilled therapeutic intervention in order to improve the following deficits and impairments:  Abnormal gait, Dizziness, Decreased balance  Visit Diagnosis: BPPV (benign paroxysmal positional vertigo), left  Dizziness and giddiness  Unsteadiness on feet  Other abnormalities of gait and mobility     Problem List Patient Active Problem List   Diagnosis Date Noted  . Iron deficiency anemia 12/15/2015  . Acute GI bleeding 05/13/2015  . Acute blood loss anemia 05/13/2015  . Normocytic anemia 05/13/2015  . Allergic rhinitis 04/06/2015  . Asthma, chronic 09/08/2013  . Hypoxia 09/08/2013  . HTN (hypertension) 09/08/2013  . Depression 09/08/2013  . DM type 2 with diabetic peripheral neuropathy ([place]) 09/08/2013  . Flu-like symptoms 09/07/2013    Billie Ruddy, PT, DPT [facility] [address] [place], [place], [address] Phone: [phone]   Fax:  [phone] 03/06/2016, 8:53 AM  Name: RONDELL ZERVAS MRN: [id number] Date of Birth: [birth year]

## 2016-03-06 NOTE — Patient Instructions (Addendum)
Tip Card 1.The goal of habituation training is to assist in decreasing symptoms of vertigo, dizziness, or nausea provoked by specific head and body motions. If you have difficulty tolerating this exercise, you may consider taking nausea medication first. 2.These exercises may initially increase symptoms; however, be persistent and work through symptoms. With repetition and time, the exercises will assist in reducing or eliminating symptoms. 3.Exercises should be stopped and discussed with the therapist if you experience any of the following: - Sudden change or fluctuation in hearing - New onset of ringing in the ears, or increase in current intensity - Any fluid discharge from the ear - Severe pain in neck or back - Extreme nausea  Copyright  VHI. All rights reserved.  Rolling   With pillow under head, start on back. Roll to your right side. Hold until dizziness stops, plus 20 seconds and then roll to the left side. Hold until dizziness stops, plus 20 seconds. Repeat sequence 5 times per session. Do 2 sessions per day.

## 2016-03-10 ENCOUNTER — Ambulatory Visit: Payer: Medicare Other | Admitting: Physical Therapy

## 2016-03-10 DIAGNOSIS — H8112 Benign paroxysmal vertigo, left ear: Secondary | ICD-10-CM | POA: Diagnosis not present

## 2016-03-10 DIAGNOSIS — R2689 Other abnormalities of gait and mobility: Secondary | ICD-10-CM

## 2016-03-10 DIAGNOSIS — R42 Dizziness and giddiness: Secondary | ICD-10-CM

## 2016-03-10 DIAGNOSIS — R2681 Unsteadiness on feet: Secondary | ICD-10-CM

## 2016-03-10 NOTE — Patient Instructions (Signed)
Tip Card 1.The goal of habituation training is to assist in decreasing symptoms of vertigo, dizziness, or nausea provoked by specific head and body motions. 2.These exercises may initially increase symptoms; however, be persistent and work through symptoms. With repetition and time, the exercises will assist in reducing or eliminating symptoms. 3.Exercises should be stopped and discussed with the therapist if you experience any of the following: - Sudden change or fluctuation in hearing - New onset of ringing in the ears, or increase in current intensity - Any fluid discharge from the ear - Severe pain in neck or back - Extreme nausea  Copyright  VHI. All rights reserved.  Rolling   With pillow under head, start on back. Roll to your right side.  Hold until dizziness stops, plus 20 seconds and then roll to the left side.  Hold until dizziness stops, plus 20 seconds.  Repeat sequence 5 times per session. Do 2 sessions per day. *You may stop doing this exercise if you experience no symptoms for 3 consecutive days.  Gaze Stabilization: Sitting    Sit down for this exercise. Wear your glasses (you may need to push them up on the bridge of your nose so you can see clearly through the top of the lens). Keeping eyes on target ("A") on wall __3_ feet away, tilt head down slightly and move head side to side for __30__ seconds.  Perform this exercise twice per day.  Gaze Stabilization: Tip Card  1.Target ("A") must remain in focus, not blurry, and appear stationary while head is in motion. 2.Perform exercises with small head movements (45 to either side of midline). 3.Increase speed of head motion so long as target is in focus.  Balance: Eyes Closed - Bilateral (Varied Surfaces)    Stand with your back to a corner with a stable chair in front of you. Stand, feet shoulder width, close eyes. Don't stand on a pillow yet. Maintain balance __30__ seconds.  Repeat _4___ times per set. Do __2__ sets  per day.

## 2016-03-10 NOTE — Therapy (Signed)
[facility] [address] [place], [place], [address] Phone: [phone]   Fax:  [phone]  Physical Therapy Treatment  Patient Details  Name: [name] MRN: [id number] Date of Birth: 09/22/1940 Referring Provider: Sarina Ill, MD  Encounter Date: 03/10/2016      PT End of Session - 03/10/16 1010    Visit Number 3   Number of Visits 9   Date for PT Re-Evaluation 05/02/16   Authorization Type UHC MCR   Authorization Time Period G Codes required   PT Start Time 0930   PT Stop Time 1011   PT Time Calculation (min) 41 min   Activity Tolerance Patient tolerated treatment well   Behavior During Therapy [facility] for tasks assessed/performed      Past Medical History  Diagnosis Date  . Diabetes mellitus   . Hypertension   . Asthma   . S/P appy   . PONV (postoperative nausea and vomiting)   . Heart murmur   . GERD (gastroesophageal reflux disease)   . Arthritis     Past Surgical History  Procedure Laterality Date  . Appendectomy  1952  . Vaginal hysterectomy  1977  . Knee arthroscopy  2003 2005    right knee  . Mass fallopian tube    . Incontinence surgery    . Joint replacement Right   . Hand surgery Left 2010    operated on index and ring finger  . Spinal cord stimulator battery exchange N/A 09/09/2015    Procedure: SPINAL CORD STIMULATOR BATTERY EXCHANGE;  Surgeon: Melina Schools, MD;  Location: [facility];  Service: Orthopedics;  Laterality: N/A;    There were no vitals filed for this visit.      Subjective Assessment - 03/10/16 0930    Subjective "I've felt worse since Monday." No room-spinning dizziness, but tightness in head and nausea. Pre-session, nause rated 7/10, headache 6/10. Post-session, pt rates nausea 3/10 and headache 3/10. Pt feeling especially stressed today because husband was back in hospital all week due to trouble with gallbladder.    Pertinent History PMH significant for: DM, HTN,  asthma, GERD, B cataract extraction, depression.   Diagnostic tests CT of head (02/23/26): No evidence of acute intracranial abnormality or mass. Moderate chronic small vessel ischemic disease.   Patient Stated Goals "I want to get rid of this dizziness and nausea."   Currently in Pain? Yes   Pain Score 6    Pain Location Head   Pain Orientation Other (Comment)  "around my entire head"   Pain Descriptors / Indicators Tightness;Headache   Pain Type Chronic pain   Pain Onset More than a month ago   Pain Frequency Intermittent   Aggravating Factors  mornings, nodding head, and shaking head right to left   Pain Relieving Factors sitting still, avoiding head movement   Effect of Pain on Daily Activities limits activity tolerance   Multiple Pain Sites No                Vestibular Assessment - 03/10/16 0001    Symptom Behavior   Type of Dizziness Imbalance  as well as and head tightness   Frequency of Dizziness daily; every morning   Aggravating Factors Mornings;Turning body quickly;Turning head quickly;Comment  visually "busy" environments   Relieving Factors Lying supine;Avoiding busy/distracting areas;Head stationary   Vestibulo-Occular Reflex   VOR 1 Head Only (x 1 viewing) Increased blurring of visual target during VOR with horizontal head movementt; target clear with decreased  veloocity of head movement.   Positional Testing   Dix-Hallpike Dix-Hallpike Right;Dix-Hallpike Left   Horizontal Canal Testing Horizontal Canal Right;Horizontal Canal Left   Dix-Hallpike Right   Dix-Hallpike Right Duration NA   Dix-Hallpike Right Symptoms No nystagmus   Dix-Hallpike Left   Dix-Hallpike Left Duration NA   Dix-Hallpike Left Symptoms No nystagmus   Horizontal Canal Right   Horizontal Canal Right Duration No nystagmus and no true vertigo. Initially with increased nausea and headache; see habituation exercises for further detail.   Horizontal Canal Right Symptoms Normal   Horizontal  Canal Left   Horizontal Canal Left Duration NA   Horizontal Canal Left Symptoms Normal                 [facility] Adult PT Treatment/Exercise - 03/10/16 0001    Ambulation/Gait   Ambulation/Gait Yes   Ambulation/Gait Assistance 4: Min guard;4: Min assist;5: Supervision   Ambulation/Gait Assistance Details Pt required min guard-min A pre-session due to staggering/veering to R side, holding onto walls. Post-session, pt required (S) and exhibited no significant gait deviations (with exception of minimal head movement, en bloc turning).   Ambulation Distance (Feet) 100 Feet  +75'   Assistive device Straight cane   Gait Pattern Step-through pattern;Decreased arm swing - right;Decreased arm swing - left;Decreased stride length  turns en bloc   Ambulation Surface Level;Indoor         Vestibular Treatment/Exercise - 03/10/16 0001    Vestibular Treatment/Exercise   Vestibular Treatment Provided Habituation;Gaze   Habituation Exercises Horizontal Roll   Gaze Exercises X1 Viewing Horizontal   Horizontal Roll   Number of Reps  5   Symptom Description  Initially with increased symptoms of headache and nauea when rolling to R side; minimal, transient symptoms (only while rolling to R side) on last rep.   X1 Viewing Horizontal   Foot Position seated   Reps 2   Comments 2 x30-sec trials with cueing for technique, velocity/amplitude of head movement with effective return demo.            Balance Exercises - 03/10/16 1034    Balance Exercises: Standing   Standing Eyes Closed Wide (BOA);Solid surface;3 reps;30 secs;Other (comment)  See below for details   Other Standing Exercises Standing with EC, wide BOS on solid surface: pt initially exhibited anterior LOB with no balance recovery reactions (required mod-max A to recover); however, on 3rd trial, pt able to maintain balance with EC x30 seconds with no overt LOB and minimal postural sway.           PT Education - 03/10/16 1009     Education provided Yes   Education Details Explained motion sensitvity, habituation. Initiated HEP habituation, gaze stabilization, and vesibular reliance.    Person(s) Educated Patient   Methods Explanation;Demonstration;Handout   Comprehension Verbalized understanding;Returned demonstration          PT Short Term Goals - 03/03/16 0905    PT SHORT TERM GOAL #1   Title STG's = LTG's           PT Long Term Goals - 03/10/16 1318    PT LONG TERM GOAL #1   Title Pt will independently perform vestibular HEP to maximize functional gains made in PT.  (Target date: 03/31/16)   PT LONG TERM GOAL #2   Title Positional vertigo testing will be negative to indicate resolved BPPV.    Baseline Met 7/14.   Status Achieved   PT LONG TERM GOAL #3   Title  Pt will decrease DHI score from 100 to 82 to indicate significant decrease in pt-perceived disability due to dizziness.    PT LONG TERM GOAL #4   Title Assess strength and balance, if indicated, after vertigo clears.                Plan - 03/10/16 1036    Clinical Impression Statement Positional testing negative today, suggesting BPPV now cleared. Pt continues to report symptoms of nausea and headache with horizontal rolling to R side. Symptoms improved within 5 reps of rolling for habituatoin. Initiated HEP for corner balance and gaze stabilization. When standing with EC, wide BOS on solid surface, pt initially demonstrated significant anterior LOB but progressed within-session to being able to stand for > 30 seconds with EC. Symptoms of nausea and headache improved iwthin session.    Rehab Potential Good   PT Frequency 2x / week   PT Duration 4 weeks   PT Treatment/Interventions ADLs/Self Care Home Management;Canalith Repostioning;Vestibular;Gait training;Neuromuscular re-education;Stair training;Functional mobility training;Therapeutic activities;Patient/family education;Therapeutic exercise;Balance training   PT Next Visit Plan Progress  vestibular HEP prn.   Consulted and Agree with Plan of Care Patient      Patient will benefit from skilled therapeutic intervention in order to improve the following deficits and impairments:  Abnormal gait, Dizziness, Decreased balance  Visit Diagnosis: Dizziness and giddiness  Other abnormalities of gait and mobility  Unsteadiness on feet     Problem List Patient Active Problem List   Diagnosis Date Noted  . Iron deficiency anemia 12/15/2015  . Acute GI bleeding 05/13/2015  . Acute blood loss anemia 05/13/2015  . Normocytic anemia 05/13/2015  . Allergic rhinitis 04/06/2015  . Asthma, chronic 09/08/2013  . Hypoxia 09/08/2013  . HTN (hypertension) 09/08/2013  . Depression 09/08/2013  . DM type 2 with diabetic peripheral neuropathy ([place]) 09/08/2013  . Flu-like symptoms 09/07/2013   Billie Ruddy, PT, DPT [facility] [address] [place], [place], [address] Phone: [phone]   Fax:  [phone] 03/10/2016, 1:19 PM  Name: [name] MRN: [id number] Date of Birth: [birth year]

## 2016-03-13 ENCOUNTER — Ambulatory Visit: Payer: Medicare Other | Admitting: Physical Therapy

## 2016-03-13 VITALS — BP 175/84 | HR 77 | Temp 98.3°F

## 2016-03-13 DIAGNOSIS — H8112 Benign paroxysmal vertigo, left ear: Secondary | ICD-10-CM | POA: Diagnosis not present

## 2016-03-13 DIAGNOSIS — R42 Dizziness and giddiness: Secondary | ICD-10-CM

## 2016-03-13 NOTE — Therapy (Signed)
[facility] [address] [place], [place], [address] Phone: [phone]   Fax:  [phone]  Physical Therapy Treatment  Patient Details  Name: [name] MRN: [id number] Date of Birth: 1940-12-02 Referring Provider: Naomie Dean, MD  Encounter Date: 03/13/2016      PT End of Session - 03/13/16 1122    Visit Number 4   Number of Visits 9   Date for PT Re-Evaluation 05/02/16   Authorization Type UHC MCR   Authorization Time Period G Codes required   PT Start Time 1102   PT Stop Time 1115   PT Time Calculation (min) 13 min   Activity Tolerance Treatment limited secondary to medical complications (Comment)      Past Medical History  Diagnosis Date  . Diabetes mellitus   . Hypertension   . Asthma   . S/P appy   . PONV (postoperative nausea and vomiting)   . Heart murmur   . GERD (gastroesophageal reflux disease)   . Arthritis     Past Surgical History  Procedure Laterality Date  . Appendectomy  1952  . Vaginal hysterectomy  1977  . Knee arthroscopy  2003 2005    right knee  . Mass fallopian tube    . Incontinence surgery    . Joint replacement Right   . Hand surgery Left 2010    operated on index and ring finger  . Spinal cord stimulator battery exchange N/A 09/09/2015    Procedure: SPINAL CORD STIMULATOR BATTERY EXCHANGE;  Surgeon: Venita Lick, MD;  Location: [facility];  Service: Orthopedics;  Laterality: N/A;    Filed Vitals:   03/13/16 1110  BP: 175/84  Pulse: 77  Temp: 98.3 F (36.8 C)        Subjective Assessment - 03/13/16 1107    Subjective "I feel so nauseated, and I have the worst burning. It starts here (epigastric) and spreads across. The dizziness might actually be better." Pt did take BP medication this morning. Husband reports pain began after eating; however, reflux medication ineffective.   Patient is accompained by: Family member  husband   Pertinent History PMH  significant for: DM, HTN, asthma, GERD, B cataract extraction, depression.   Diagnostic tests CT of head (02/23/26): No evidence of acute intracranial abnormality or mass. Moderate chronic small vessel ischemic disease.   Patient Stated Goals "I want to get rid of this dizziness and nausea."   Currently in Pain? Yes   Pain Score 7    Pain Location Epigastric   Pain Orientation Anterior;Mid   Pain Descriptors / Indicators Burning   Pain Type Acute pain   Pain Onset Today   Aggravating Factors  Pt unsure   Pain Relieving Factors Pt unsure                                 PT Education - 03/13/16 1122    Education provided Yes   Education Details Recommending pt seek immediate medical attention.    Person(s) Educated Patient;Spouse   Methods Explanation   Comprehension Verbalized understanding          PT Short Term Goals - 03/03/16 0905    PT SHORT TERM GOAL #1   Title STG's = LTG's           PT Long Term Goals - 03/10/16 1318    PT LONG TERM GOAL #1   Title Pt will independently  perform vestibular HEP to maximize functional gains made in PT.  (Target date: 03/31/16)   PT LONG TERM GOAL #2   Title Positional vertigo testing will be negative to indicate resolved BPPV.    Baseline Met 7/14.   Status Achieved   PT LONG TERM GOAL #3   Title Pt will decrease DHI score from 100 to 82 to indicate significant decrease in pt-perceived disability due to dizziness.    PT LONG TERM GOAL #4   Title Assess strength and balance, if indicated, after vertigo clears.                Plan - 03/13/16 1123    Clinical Impression Statement Pt arrived to session exhibited obvious signs of discomfort, such as bending over, guarding abdomen. Reports onset of nausea and epigastric burning this morning after breakfast. Medication for reflux did not help symptoms. BP 175/84. Body temp WNL. Pt did take BP medication this morning. Pt has not seen cardiologist in approx. 1  year. Based on symptoms, PT recommended pt seek immediate medical attention to rule out more serious etiology (such as cardiac). . Pt/husband verbalized understanding and were in full agreement.    Rehab Potential Good   PT Frequency 2x / week   PT Duration 4 weeks   PT Treatment/Interventions ADLs/Self Care Home Management;Canalith Repostioning;Vestibular;Gait training;Neuromuscular re-education;Stair training;Functional mobility training;Therapeutic activities;Patient/family education;Therapeutic exercise;Balance training   PT Next Visit Plan Progress vestibular HEP prn.   Consulted and Agree with Plan of Care Patient      Patient will benefit from skilled therapeutic intervention in order to improve the following deficits and impairments:  Abnormal gait, Dizziness, Decreased balance  Visit Diagnosis: Dizziness and giddiness     Problem List Patient Active Problem List   Diagnosis Date Noted  . Iron deficiency anemia 12/15/2015  . Acute GI bleeding 05/13/2015  . Acute blood loss anemia 05/13/2015  . Normocytic anemia 05/13/2015  . Allergic rhinitis 04/06/2015  . Asthma, chronic 09/08/2013  . Hypoxia 09/08/2013  . HTN (hypertension) 09/08/2013  . Depression 09/08/2013  . DM type 2 with diabetic peripheral neuropathy ([place]) 09/08/2013  . Flu-like symptoms 09/07/2013    .Billie Ruddy, PT, DPT [facility] [address] [place], [place], [address] Phone: [phone]   Fax:  [phone] 03/13/2016, 11:27 AM  Name: [name] MRN: [id number] Date of Birth: 1940/11/10

## 2016-03-15 ENCOUNTER — Ambulatory Visit: Payer: Medicare Other | Admitting: Physical Therapy

## 2016-03-16 ENCOUNTER — Other Ambulatory Visit: Payer: Medicare Other

## 2016-03-16 ENCOUNTER — Ambulatory Visit: Payer: Medicare Other | Admitting: Oncology

## 2016-03-20 ENCOUNTER — Ambulatory Visit: Payer: Medicare Other | Admitting: Physical Therapy

## 2016-03-22 ENCOUNTER — Ambulatory Visit: Payer: Medicare Other | Admitting: Physical Therapy

## 2016-03-22 DIAGNOSIS — R42 Dizziness and giddiness: Secondary | ICD-10-CM

## 2016-03-22 DIAGNOSIS — R2681 Unsteadiness on feet: Secondary | ICD-10-CM

## 2016-03-22 DIAGNOSIS — H8112 Benign paroxysmal vertigo, left ear: Secondary | ICD-10-CM | POA: Diagnosis not present

## 2016-03-22 DIAGNOSIS — R2689 Other abnormalities of gait and mobility: Secondary | ICD-10-CM

## 2016-03-22 NOTE — Patient Instructions (Addendum)
Gaze Stabilization: Standing Feet Apart    Stand up and place one hand on a stable surface. Place you target ("A") on your wall at eye-level. Wear your glasses (you may need to push them up on the bridge of your nose so you can see clearly through the top of the lens). Keeping eyes on target ("A") on wall __3_ feet away, tilt head down slightly and move head side to side for __30__ seconds.  Perform this exercise at least twice per day.  Gaze Stabilization: Tip Card  1.Target ("A") must remain in focus, not blurry, and appear stationary while head is in motion. 2.Perform exercises with small head movements (45 to either side of midline). 3.Increase speed of head motion so long as target is in focus.  Balance: Eyes Closed - Bilateral (Varied Surfaces)    Stand with your back to a corner with a stable chair in front of you. Stand on 1 pillow, feet shoulder width, close eyes.  Maintain balance __20__ seconds.  Perform 4 times. Do 2 sets per day.  Feet Apart, Head Motion - Eyes Closed    Stand with your back to a corner with a stable chair in front of you. With eyes closed and feet shoulder width apart, move head slowly: up and down 5 times; and right to left 5 times. Perform this exercise __2-3__ times per day.  Copyright  VHI. All rights reserved.

## 2016-03-22 NOTE — Therapy (Signed)
[facility] [address] [place], [place], [address] Phone: [phone]   Fax:  [phone]  Physical Therapy Treatment  Patient Details  Name: [name] MRN: [id number] Date of Birth: Apr 19, 1941 Referring Provider: Sarina Ill, MD  Encounter Date: 03/22/2016      PT End of Session - 03/22/16 1024    Visit Number 5   Number of Visits 9   Date for PT Re-Evaluation 05/02/16   Authorization Type UHC MCR   Authorization Time Period G Codes required   PT Start Time 0931   PT Stop Time 1014   PT Time Calculation (min) 43 min   Activity Tolerance Patient tolerated treatment well   Behavior During Therapy [facility] for tasks assessed/performed      Past Medical History:  Diagnosis Date  . Arthritis   . Asthma   . Diabetes mellitus   . GERD (gastroesophageal reflux disease)   . Heart murmur   . Hypertension   . PONV (postoperative nausea and vomiting)   . S/P appy     Past Surgical History:  Procedure Laterality Date  . APPENDECTOMY  1952  . HAND SURGERY Left 2010   operated on index and ring finger  . INCONTINENCE SURGERY    . JOINT REPLACEMENT Right   . KNEE ARTHROSCOPY  2003 2005   right knee  . mass fallopian tube    . SPINAL CORD STIMULATOR BATTERY EXCHANGE N/A 09/09/2015   Procedure: SPINAL CORD STIMULATOR BATTERY EXCHANGE;  Surgeon: Melina Schools, MD;  Location: [facility];  Service: Orthopedics;  Laterality: N/A;  . VAGINAL HYSTERECTOMY  1977    There were no vitals filed for this visit.      Subjective Assessment - 03/22/16 0936    Subjective "I went to the doctor after I left here last week. I had an endoscopy...it didn't really show anything. So they changed one of my medications, and I feel a lot better. The dizziness is a lot better. I did have low blood sugar this morning, so that's got me feeling bad now. I have not been doing the exercises because I've been so sick."   Pertinent History PMH  significant for: DM, HTN, asthma, GERD, B cataract extraction, depression.   Diagnostic tests CT of head (02/23/26): No evidence of acute intracranial abnormality or mass. Moderate chronic small vessel ischemic disease.   Patient Stated Goals "I want to get rid of this dizziness and nausea."   Currently in Pain? No/denies                         [facility] Adult PT Treatment/Exercise - 03/22/16 0001      Transfers   Transfers Sit to Stand;Stand to Sit   Sit to Stand 5: Supervision   Stand to Sit 5: Supervision     Ambulation/Gait   Ambulation/Gait Yes   Ambulation/Gait Assistance 5: Supervision;4: Min guard   Ambulation/Gait Assistance Details 2 x230' with cueing for 1/4 turns leading with eyes, head, then body due to marked gait instability with 90 degree turns. Cueing also for reciprocal arm swing.  no AD   Ambulation Distance (Feet) 460 Feet   Assistive device None;Other (Comment)  ambulated into/out of clinic using Roy   Gait Pattern Step-through pattern;Decreased arm swing - right;Decreased arm swing - left;Decreased stride length;Decreased trunk rotation;Narrow base of support  turns en bloc; scissoring with head turns to L>R   Ambulation Surface Level;Indoor  Vestibular Treatment/Exercise - 03/22/16 0001      X1 Viewing Horizontal   Foot Position standing; single UE support, feet shoulder width apart   Reps 2   Comments 2 x 30-sec trials with cueing to decrease amplitude of head movement, increase velocity as long as target in focus.            Balance Exercises - 03/22/16 1023      Balance Exercises: Standing   Wall Bumps Hip   Wall Bumps-Hips Eyes opened;Eyes closed;Anterior/posterior;10 reps;Other (comment)  x10 reps EO, x10 reps EC with min guard   Gait with Head Turns 2 reps;Forward;Other (comment)  2 x50' while searching for cards on wall with min guard-minA           PT Education - 03/22/16 1002    Education provided Yes    Education Details Reviewed and progressed current vestibular HEP; see Pt Instructions.    Person(s) Educated Patient   Methods Explanation;Demonstration;Handout;Verbal cues   Comprehension Verbalized understanding;Returned demonstration          PT Short Term Goals - 03/03/16 0905      PT SHORT TERM GOAL #1   Title STG's = LTG's           PT Long Term Goals - 03/10/16 1318      PT LONG TERM GOAL #1   Title Pt will independently perform vestibular HEP to maximize functional gains made in PT.  (Target date: 03/31/16)     PT LONG TERM GOAL #2   Title Positional vertigo testing will be negative to indicate resolved BPPV.    Baseline Met 7/14.   Status Achieved     PT LONG TERM GOAL #3   Title Pt will decrease DHI score from 100 to 82 to indicate significant decrease in pt-perceived disability due to dizziness.      PT LONG TERM GOAL #4   Title Assess strength and balance, if indicated, after vertigo clears.                Plan - 03/22/16 1028    Clinical Impression Statement Pt much more comfortable today; reports no pain and no nausea currently. Underwent endoscopy immediately after last session; medication was changed and pt has felt better since. Demonstates marked improvement gait stability today. Progressed all vestibular home exercises today; pt tolerated well.    Rehab Potential Good   PT Frequency 2x / week   PT Duration 4 weeks   PT Treatment/Interventions ADLs/Self Care Home Management;Canalith Repostioning;Vestibular;Gait training;Neuromuscular re-education;Stair training;Functional mobility training;Therapeutic activities;Patient/family education;Therapeutic exercise;Balance training   PT Next Visit Plan Assess current vestibular HEP and progress prn. Gait training without AD. Gait with functional head turns.    Consulted and Agree with Plan of Care Patient      Patient will benefit from skilled therapeutic intervention in order to improve the following  deficits and impairments:  Abnormal gait, Dizziness, Decreased balance  Visit Diagnosis: Dizziness and giddiness  Other abnormalities of gait and mobility  Unsteadiness on feet     Problem List Patient Active Problem List   Diagnosis Date Noted  . Iron deficiency anemia 12/15/2015  . Acute GI bleeding 05/13/2015  . Acute blood loss anemia 05/13/2015  . Normocytic anemia 05/13/2015  . Allergic rhinitis 04/06/2015  . Asthma, chronic 09/08/2013  . Hypoxia 09/08/2013  . HTN (hypertension) 09/08/2013  . Depression 09/08/2013  . DM type 2 with diabetic peripheral neuropathy ([place]) 09/08/2013  . Flu-like symptoms 09/07/2013  Billie Ruddy, PT, DPT [facility] [address] [place], [place], [address] Phone: [phone]   Fax:  [phone] 03/22/16, 10:32 AM  Name: [name] MRN: [id number] Date of Birth: [birth year]

## 2016-03-27 ENCOUNTER — Ambulatory Visit: Payer: Medicare Other | Admitting: Physical Therapy

## 2016-03-27 DIAGNOSIS — H8112 Benign paroxysmal vertigo, left ear: Secondary | ICD-10-CM | POA: Diagnosis not present

## 2016-03-27 DIAGNOSIS — R2689 Other abnormalities of gait and mobility: Secondary | ICD-10-CM

## 2016-03-27 DIAGNOSIS — R2681 Unsteadiness on feet: Secondary | ICD-10-CM
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
## 2016-03-27 NOTE — Patient Instructions (Signed)
Gaze Stabilization: Standing Feet Apart    Stand up and place one hand on a stable surface. Place you target ("A") on your wall at eye-level. Wear your glasses (you may need to push them up on the bridge of your nose so you can see clearly through the top of the lens). Keeping eyes on target ("A") on wall __3_ feet away, tilt head down slightly and move head side to side for __30__ seconds. Perform this exercise at least twice per day.  Gaze Stabilization: Tip Card  1.Target ("A") must remain in focus, not blurry, and appear stationary while head is in motion. 2.Perform exercises with small head movements (45 to either side of midline). 3.Increase speed of head motion so long as target is in focus.  Feet Apart (Compliant Surface) Head Motion - Eyes Closed    Stand with your back to a corner with a stable chair in front of you. Stand on 1 pillow with feet shoulder width apart. Close eyes and move head very slowly: up and down 5 times; right to left 5 times. Repeat _2-3__ times per day.  Walking Head Turn    Standing close to a wall, walk the length of your hallway while turning head right to left (2 steps with head to right, 2 steps to left). Touch wall if necessary to keep balance. Perform this exercise for at least 3 minutes per day.  Copyright  VHI. All rights reserved.

## 2016-03-29 ENCOUNTER — Ambulatory Visit: Payer: Medicare Other | Attending: Diagnostic Neuroimaging | Admitting: Physical Therapy

## 2016-03-29 DIAGNOSIS — R2689 Other abnormalities of gait and mobility: Secondary | ICD-10-CM

## 2016-03-29 DIAGNOSIS — R42 Dizziness and giddiness: Secondary | ICD-10-CM | POA: Diagnosis present

## 2016-03-29 DIAGNOSIS — R2681 Unsteadiness on feet: Secondary | ICD-10-CM | POA: Diagnosis present

## 2016-03-29 NOTE — Therapy (Signed)
[facility] [address] [place], [place], [address] Phone: [phone]   Fax:  [phone]  Physical Therapy Treatment  Patient Details  Name: ANUPAMA PIEHL MRN: [id number] Date of Birth: 07/31/1941 Referring Provider: Sarina Ill, MD  Encounter Date: 03/29/2016      PT End of Session - 03/29/16 1219    Visit Number 7   Number of Visits 9   Date for PT Re-Evaluation 05/02/16   Authorization Type UHC MCR   Authorization Time Period G Codes required   PT Start Time 0932   PT Stop Time 1014   PT Time Calculation (min) 42 min   Activity Tolerance Patient tolerated treatment well   Behavior During Therapy [facility] for tasks assessed/performed      Past Medical History:  Diagnosis Date  . Arthritis   . Asthma   . Diabetes mellitus   . GERD (gastroesophageal reflux disease)   . Heart murmur   . Hypertension   . PONV (postoperative nausea and vomiting)   . S/P appy     Past Surgical History:  Procedure Laterality Date  . APPENDECTOMY  1952  . HAND SURGERY Left 2010   operated on index and ring finger  . INCONTINENCE SURGERY    . JOINT REPLACEMENT Right   . KNEE ARTHROSCOPY  2003 2005   right knee  . mass fallopian tube    . SPINAL CORD STIMULATOR BATTERY EXCHANGE N/A 09/09/2015   Procedure: SPINAL CORD STIMULATOR BATTERY EXCHANGE;  Surgeon: Melina Schools, MD;  Location: [facility];  Service: Orthopedics;  Laterality: N/A;  . VAGINAL HYSTERECTOMY  1977    There were no vitals filed for this visit.      Subjective Assessment - 03/29/16 0935    Subjective "I came in without the cane today.Marland KitchenMarland KitchenI left it in the waiting room with my husband. Mornings are still hard for me. I feel nauseated first thing in the morning. I'm not taking the Zofran anymore, but I usually feel nauseated until about 9:30 am." Reports dizziness when she bends over to unload dishwasher.   Pertinent History PMH significant for: DM, HTN, asthma,  GERD, B cataract extraction, depression.   Diagnostic tests CT of head (02/23/26): No evidence of acute intracranial abnormality or mass. Moderate chronic small vessel ischemic disease.   Patient Stated Goals "I want to get rid of this dizziness and nausea."   Currently in Pain? No/denies                Vestibular Assessment - 03/29/16 0001      Sidelying Right   Sidelying Right Duration No true vertigo in R sidelying position, but pt reports "funny feeling in head" upon returning to seated EOM.   Sidelying Right Symptoms No nystagmus     Sidelying Left   Sidelying Left Duration No true vertigo in L sidelying position, but pt reports "funny feeling in head" upon returning to seated EOM.   Sidelying Left Symptoms No nystagmus     Horizontal Canal Right   Horizontal Canal Right Duration No nystagmus but pt reports, "My head just feels funny"   Horizontal Canal Right Symptoms Normal     Horizontal Canal Left   Horizontal Canal Left Duration NA   Horizontal Canal Left Symptoms Normal     Positional Sensitivities   Supine to Right Side Mild dizziness   Supine to Sitting Lightheadedness     Orthostatics   BP supine (x 5 minutes) 132/64   HR  supine (x 5 minutes) 64   BP standing (after 1 minute) 129/61   HR standing (after 1 minute) 68   BP standing (after 3 minutes) 133/63   HR standing (after 3 minutes) 71   Orthostatics Comment Pt has taken BP medication today. Pt started taking diuretic this week.                  [facility] Adult PT Treatment/Exercise - 03/29/16 0001      Ambulation/Gait   Ambulation/Gait Yes   Ambulation/Gait Assistance 5: Supervision;4: Min guard;4: Min assist   Ambulation/Gait Assistance Details Increased gait instability today, as pt staggering to R/L, exhibiting BLE scissoring with turning. Requires closed (S) to min A for stability/balance.   Ambulation Distance (Feet) 200 Feet   Assistive device None   Gait Pattern Step-through  pattern;Decreased arm swing - right;Decreased arm swing - left;Decreased stride length;Decreased trunk rotation;Narrow base of support   Ambulation Surface Level;Indoor     Therapeutic Activites    Therapeutic Activities Other Therapeutic Activities   Other Therapeutic Activities In ADL kitchen, pt demonstrated technique for removing dishes from dishwasher (as this causes significant dizziness, per pt). Pt demonstrates repeated head movement from upright to head at approx. heart-level when retrieving dishes from dishwasher. PT cued pt to flex hips/knees to retrieve dishes, to maintain head at same level (upright, above hearrt), and to perform visual spotting in direction of movement (finding spot to look at in dishwasher, then on countertop).          Vestibular Treatment/Exercise - 03/29/16 0001      Vestibular Treatment/Exercise   Vestibular Treatment Provided Habituation   Habituation Exercises Horizontal Roll;Brandt Daroff     Nestor Lewandowsky   Number of Reps  3   Symptom Description  Initially with symptoms of lightheadedness with transition from R/L sidelying  > sit. Symptoms improved after 2 additional reps.     Horizontal Roll   Number of Reps  3   Symptom Description  Initially with symptoms of "funny feeling in head" with rolling to R side; however, symptoms dissipated after additional 2 reps of rolling.            Balance Exercises - 03/29/16 1214      Balance Exercises: Standing   Standing Eyes Closed Wide (BOA);Head turns;Foam/compliant surface;5 reps  Note postural instability with vertical > horiz. head turns             PT Short Term Goals - 03/03/16 0905      PT SHORT TERM GOAL #1   Title STG's = LTG's           PT Long Term Goals - 03/10/16 1318      PT LONG TERM GOAL #1   Title Pt will independently perform vestibular HEP to maximize functional gains made in PT.  (Target date: 03/31/16)     PT LONG TERM GOAL #2   Title Positional vertigo  testing will be negative to indicate resolved BPPV.    Baseline Met 7/14.   Status Achieved     PT LONG TERM GOAL #3   Title Pt will decrease DHI score from 100 to 82 to indicate significant decrease in pt-perceived disability due to dizziness.      PT LONG TERM GOAL #4   Title Assess strength and balance, if indicated, after vertigo clears.                Plan - 03/29/16 1220    Clinical  Impression Statement Pt arrived to this session exhibiting decreased gait stability and with report of increased lightheadedness today. Upon further questioning, pt noted that blood glucose was slightly elevated (133 mg/dL) prior to PT this morning. Orthostatic vital signs unremarkable. Pt continues to report symptoms of motion sensitivity with R/L sidelying > sit and with horizontal rolling to R side. Therefore, asked that pt hold current vestibular HEP and perform Nestor Lewandowsky and horizontal rolling for habituation until next session.    Rehab Potential Good   PT Frequency 2x / week   PT Duration 4 weeks   PT Treatment/Interventions ADLs/Self Care Home Management;Canalith Repostioning;Vestibular;Gait training;Neuromuscular re-education;Stair training;Functional mobility training;Therapeutic activities;Patient/family education;Therapeutic exercise;Balance training   PT Next Visit Plan Ask about habituation exercises. Reassess for motion sensitivity with horizontal rolling and [employer]. Continue gait training without AD. Gait with functional head turns.  Standing balance with vestibular reliance.   Consulted and Agree with Plan of Care Patient      Patient will benefit from skilled therapeutic intervention in order to improve the following deficits and impairments:  Abnormal gait, Dizziness, Decreased balance  Visit Diagnosis: Other abnormalities of gait and mobility  Unsteadiness on feet  Dizziness and giddiness     Problem List Patient Active Problem List   Diagnosis Date Noted   . Iron deficiency anemia 12/15/2015  . Acute GI bleeding 05/13/2015  . Acute blood loss anemia 05/13/2015  . Normocytic anemia 05/13/2015  . Allergic rhinitis 04/06/2015  . Asthma, chronic 09/08/2013  . Hypoxia 09/08/2013  . HTN (hypertension) 09/08/2013  . Depression 09/08/2013  . DM type 2 with diabetic peripheral neuropathy ([place]) 09/08/2013  . Flu-like symptoms 09/07/2013   Billie Ruddy, PT, DPT [facility] [address] [place], [place], [address] Phone: [phone]   Fax:  [phone] 03/29/16, 12:23 PM  Name: MISHIKA FLIPPEN MRN: [id number] Date of Birth: [birth year]

## 2016-03-29 NOTE — Patient Instructions (Addendum)
Nicosha, pause your other home exercises for right now. Perform only the 2 exercises below every day until the next time you see me.  Tip Card 1.The goal of habituation training is to assist in decreasing symptoms of vertigo, dizziness, or nausea provoked by specific head and body motions. 2.These exercises may initially increase symptoms; however, be persistent and work through symptoms. With repetition and time, the exercises will assist in reducing or eliminating symptoms. 3.Exercises should be stopped and discussed with the therapist if you experience any of the following: - Sudden change or fluctuation in hearing - New onset of ringing in the ears, or increase in current intensity - Any fluid discharge from the ear - Severe pain in neck or back - Extreme nausea  Copyright  VHI. All rights reserved.  Rolling   With pillow under head, start on back. Roll to your right side.  Hold until dizziness stops, plus 20 seconds and then roll to the left side.  Hold until dizziness stops, plus 20 seconds.  Repeat sequence 5 times per session. Do 2 sessions per day.  Copyright  VHI. All rights reserved.  Sit to Side-Lying   Sit on edge of bed. Lie down onto the right side and hold until dizziness stops, plus 20 seconds.  Return to sitting and wait until dizziness stops, plus 20 seconds.  Repeat to the left side. Repeat sequence 5 times per session. Do 2 sessions per day.  Copyright  VHI. All rights reserved.

## 2016-04-06 ENCOUNTER — Ambulatory Visit: Payer: Medicare Other | Admitting: Physical Therapy

## 2016-04-06 DIAGNOSIS — R2689 Other abnormalities of gait and mobility: Secondary | ICD-10-CM

## 2016-04-06 DIAGNOSIS — R42 Dizziness and giddiness: Secondary | ICD-10-CM

## 2016-04-06 DIAGNOSIS — R2681 Unsteadiness on feet: Secondary | ICD-10-CM

## 2016-04-06 NOTE — Patient Instructions (Signed)
Martisa, pause your other home exercises for right now. Perform only the 2 exercises below every day until the next time you see me.  Tip Card 1.The goal of habituation training is to assist in decreasing symptoms of vertigo, dizziness, or nausea provoked by specific head and body motions. 2.These exercises may initially increase symptoms; however, be persistent and work through symptoms. With repetition and time, the exercises will assist in reducing or eliminating symptoms. 3.Exercises should be stopped and discussed with the therapist if you experience any of the following: - Sudden change or fluctuation in hearing - New onset of ringing in the ears, or increase in current intensity - Any fluid discharge from the ear - Severe pain in neck or back - Extreme nausea  Copyright  VHI. All rights reserved.  Rolling   With pillow under head, start on back. Roll to your right side.  Hold until dizziness stops, plus 20 seconds and then roll to the left side.  Hold until dizziness stops, plus 20 seconds.  Repeat sequence 5 times per session. Do 2 sessions per day.  Copyright  VHI. All rights reserved.  Sit to Side-Lying   Sit on edge of bed. Lie down onto the right side and hold until dizziness stops, plus 20 seconds.  Return to sitting and wait until dizziness stops, plus 20 seconds.  Repeat to the left side. Repeat sequence 5 times per session. Do 2 sessions per day.

## 2016-04-06 NOTE — Therapy (Signed)
[facility] [address] [place], [place], [address] Phone: [phone]   Fax:  [phone]  Physical Therapy Treatment  Patient Details  Name: [name] MRN: [id number] Date of Birth: Apr 12, 1941 Referring Provider: Sarina Ill, MD  Encounter Date: 04/06/2016      PT End of Session - 04/06/16 1633    Visit Number 8   Number of Visits 9   Date for PT Re-Evaluation 05/02/16   Authorization Type UHC MCR   Authorization Time Period G Codes required   PT Start Time 1532   PT Stop Time 1610   PT Time Calculation (min) 38 min   Activity Tolerance Patient tolerated treatment well   Behavior During Therapy [facility] for tasks assessed/performed      Past Medical History:  Diagnosis Date  . Arthritis   . Asthma   . Diabetes mellitus   . GERD (gastroesophageal reflux disease)   . Heart murmur   . Hypertension   . PONV (postoperative nausea and vomiting)   . S/P appy     Past Surgical History:  Procedure Laterality Date  . APPENDECTOMY  1952  . HAND SURGERY Left 2010   operated on index and ring finger  . INCONTINENCE SURGERY    . JOINT REPLACEMENT Right   . KNEE ARTHROSCOPY  2003 2005   right knee  . mass fallopian tube    . SPINAL CORD STIMULATOR BATTERY EXCHANGE N/A 09/09/2015   Procedure: SPINAL CORD STIMULATOR BATTERY EXCHANGE;  Surgeon: Melina Schools, MD;  Location: [name];  Service: Orthopedics;  Laterality: N/A;  . VAGINAL HYSTERECTOMY  1977    There were no vitals filed for this visit.      Subjective Assessment - 04/06/16 1535    Subjective "I did have a fall this week.Marland KitchenMarland KitchenI was reaching down to pick something up." Pt was not using cane and was not holding onto anything when bending over to pick something up from floor. Pt uninjured; no head trauma. Pt reports feeling unsteady today. Blood glucose was 150 mg/dL prior to leaving for this PT session.    Pertinent History PMH significant for: DM, HTN,  asthma, GERD, B cataract extraction, depression.   Diagnostic tests CT of head (02/23/26): No evidence of acute intracranial abnormality or mass. Moderate chronic small vessel ischemic disease.   Patient Stated Goals "I want to get rid of this dizziness and nausea."   Currently in Pain? No/denies                Vestibular Assessment - 04/06/16 0001      Positional Testing   Horizontal Canal Testing Horizontal Canal Right;Horizontal Canal Left     Dix-Hallpike Right   Dix-Hallpike Right Duration 2/10 symptoms ("like my head is rolling", per pt), but no nystagmus  using Frenzel lenses   Dix-Hallpike Right Symptoms No nystagmus     Dix-Hallpike Left   Dix-Hallpike Left Duration NA   Dix-Hallpike Left Symptoms No nystagmus     Positional Sensitivities   Positional Sensitivities Comments R sidelying > sit: 4/5 symptoms. L sidelying > sit: 3/5.                  Vestibular Treatment/Exercise - 04/06/16 0001      Nestor Lewandowsky   Number of Reps  5   Symptom Description  Pt requires cueing to effectively perform habituation exercises (even when using paper handout). R sidelying > sit: 4/5 symptoms initially, 2/5 symptoms on reps 3-5. L  sideyling > sit: 3/5 symptoms initially, 3/5 symptoms on final trial.     Horizontal Roll   Number of Reps  5   Symptom Description  Pt requires cueing to effectively perform habituation exercises (even when using paper handout). R rolling: 2/10 initially; L rolling: asymptomatic.                PT Education - 04/06/16 1615    Education provided Yes   Education Details Recommending pt use SPC for all mobility and advised pt to hold onto stable surface with forward bending. Extensive education provided about BPPV, how prolonged BPPV without treatment can cause motion sensitivity with head/body movement, and how habituation exercises are used to address this motion sensitivity. Strongly recommended pt perform 2 habituation exercises  twice daily for 10 days prior to returning to vestibular PT.    Person(s) Educated Patient   Methods Explanation;Demonstration;Verbal cues;Handout   Comprehension Verbalized understanding;Returned demonstration          PT Short Term Goals - 03/03/16 0905      PT SHORT TERM GOAL #1   Title STG's = LTG's           PT Long Term Goals - 03/10/16 1318      PT LONG TERM GOAL #1   Title Pt will independently perform vestibular HEP to maximize functional gains made in PT.  (Target date: 03/31/16)     PT LONG TERM GOAL #2   Title Positional vertigo testing will be negative to indicate resolved BPPV.    Baseline Met 7/14.   Status Achieved     PT LONG TERM GOAL #3   Title Pt will decrease DHI score from 100 to 82 to indicate significant decrease in pt-perceived disability due to dizziness.      PT LONG TERM GOAL #4   Title Assess strength and balance, if indicated, after vertigo clears.                Plan - 04/06/16 1633    Clinical Impression Statement Pt arrived to session with report of ongoing dizziness, perceived imbalance, and recent fall. Pt reports inconsistent performance of 2 habituation exercises since last session. Upon reviewing exercises, pt required paper handout and cueing for technique. Symptoms improved within 5 reps with both Nestor Lewandowsky and horizontal rolling. Recommending pt perform habituation exercises 2x/day for 10 days, then return to vestibular PT for reassessment.    Rehab Potential Good   PT Frequency 2x / week   PT Duration 4 weeks   PT Treatment/Interventions ADLs/Self Care Home Management;Canalith Repostioning;Vestibular;Gait training;Neuromuscular re-education;Stair training;Functional mobility training;Therapeutic activities;Patient/family education;Therapeutic exercise;Balance training   PT Next Visit Plan Reassess for motion sensitivity. Perform SOT, if appropriate, and recert vs. DC.   Consulted and Agree with Plan of Care Patient       Patient will benefit from skilled therapeutic intervention in order to improve the following deficits and impairments:  Abnormal gait, Dizziness, Decreased balance  Visit Diagnosis: Other abnormalities of gait and mobility  Unsteadiness on feet  Dizziness and giddiness     Problem List Patient Active Problem List   Diagnosis Date Noted  . Iron deficiency anemia 12/15/2015  . Acute GI bleeding 05/13/2015  . Acute blood loss anemia 05/13/2015  . Normocytic anemia 05/13/2015  . Allergic rhinitis 04/06/2015  . Asthma, chronic 09/08/2013  . Hypoxia 09/08/2013  . HTN (hypertension) 09/08/2013  . Depression 09/08/2013  . DM type 2 with diabetic peripheral neuropathy ([place]) 09/08/2013  . Flu-like  symptoms 09/07/2013   Billie Ruddy, PT, DPT [facility] [address] [place], [place], [address] Phone: [phone]   Fax:  [phone] 04/06/16, 4:44 PM  Name: CATALIA MASSETT MRN: [id number] Date of Birth: 15-Oct-1940

## 2016-04-07 ENCOUNTER — Ambulatory Visit: Payer: Medicare Other | Admitting: Physical Therapy

## 2016-04-18 ENCOUNTER — Ambulatory Visit: Payer: Medicare Other | Admitting: Physical Therapy

## 2016-04-18 DIAGNOSIS — R2689 Other abnormalities of gait and mobility: Secondary | ICD-10-CM | POA: Diagnosis not present

## 2016-04-18 DIAGNOSIS — R42 Dizziness and giddiness: Secondary | ICD-10-CM

## 2016-04-18 DIAGNOSIS — R2681 Unsteadiness on feet: Secondary | ICD-10-CM

## 2016-04-18 NOTE — Patient Instructions (Signed)
Quantina, you may stop doing the sit to sidelying home exercise, as this no longer causes symptoms. Continue performing the exercises below every day.  Tip Card 1.The goal of habituation training is to assist in decreasing symptoms of vertigo, dizziness, or nausea provoked by specific head and body motions. 2.These exercises may initially increase symptoms; however, be persistent and work through symptoms. With repetition and time, the exercises will assist in reducing or eliminating symptoms. 3.Exercises should be stopped and discussed with the therapist if you experience any of the following: - Sudden change or fluctuation in hearing - New onset of ringing in the ears, or increase in current intensity - Any fluid discharge from the ear - Severe pain in neck or back - Extreme nausea  Copyright  VHI. All rights reserved. Rolling   With pillow under head, start on back. Roll to your right side. Hold until dizziness stops, plus 20 seconds and then roll to the left side. Hold until dizziness stops, plus 20 seconds. Repeat sequence 5 times per session. Do 2 sessions per day.  Perform the following exercises with your back to a corner with stable chair in front of you:  Feet Apart, Varied Arm Positions - Eyes Closed    Stand with feet shoulder width apart and arms by your side. Close eyes and visualize upright position. Hold _30__ seconds. Repeat __4__ times per session. Do __2_ sessions per day.   Feet Apart (Compliant Surface) Head Motion - Eyes Open    With eyes open, standing on 1 pillow, feet shoulder width apart, move head slowly: up and down 5 times; right to left 5 times.  Do __2__ sessions per day.  Copyright  VHI. All rights reserved.

## 2016-04-18 NOTE — Therapy (Signed)
[facility] [address] [place], [place], [address] Phone: [phone]   Fax:  [phone]  Physical Therapy Treatment  Patient Details  Name: [name] MRN: [id number] Date of Birth: 09-26-1940 Referring Provider: Sarina Ill, MD  Encounter Date: 04/18/2016      PT End of Session - 04/18/16 1350    Visit Number 9   Number of Visits 13  requesting 4 additional sessions   Date for PT Re-Evaluation 06/17/16   Authorization Type UHC MCR   Authorization Time Period G Codes required   PT Start Time 1100   PT Stop Time 1148   PT Time Calculation (min) 48 min   Equipment Utilized During Treatment Other (comment)  Balance Master, harness   Activity Tolerance Patient tolerated treatment well   Behavior During Therapy [facility] for tasks assessed/performed      Past Medical History:  Diagnosis Date  . Arthritis   . Asthma   . Diabetes mellitus   . GERD (gastroesophageal reflux disease)   . Heart murmur   . Hypertension   . PONV (postoperative nausea and vomiting)   . S/P appy     Past Surgical History:  Procedure Laterality Date  . APPENDECTOMY  1952  . HAND SURGERY Left 2010   operated on index and ring finger  . INCONTINENCE SURGERY    . JOINT REPLACEMENT Right   . KNEE ARTHROSCOPY  2003 2005   right knee  . mass fallopian tube    . SPINAL CORD STIMULATOR BATTERY EXCHANGE N/A 09/09/2015   Procedure: SPINAL CORD STIMULATOR BATTERY EXCHANGE;  Surgeon: Melina Schools, MD;  Location: [facility];  Service: Orthopedics;  Laterality: N/A;  . VAGINAL HYSTERECTOMY  1977    There were no vitals filed for this visit.      Subjective Assessment - 04/18/16 1100    Subjective Pt arrived to session without use of SPC. Pt states, "I feel like I'm a lot better, and I've been doing the exercises twice a day, every day. I just have this dizziness and nausea first thing in the morning. It gets better throughout the day." Pt  reports that she no longer experiences dizziness with exercises.   Pertinent History PMH significant for: DM, HTN, asthma, GERD, B cataract extraction, depression.   Diagnostic tests CT of head (02/23/26): No evidence of acute intracranial abnormality or mass. Moderate chronic small vessel ischemic disease.   Patient Stated Goals "I want to get rid of this dizziness and nausea."   Currently in Pain? No/denies            [facility] PT Assessment - 04/18/16 0001      Assessment   Medical Diagnosis Dizziness; worsening headache; intractable cyclic vomiting with nausea   Referring Provider Sarina Ill, MD   Onset Date/Surgical Date 12/11/15     Prior Function   Level of Independence Independent   Vocation Retired   Leisure Likes to read and Engineer, production cards.            Vestibular Assessment - 04/18/16 0001      Positional Testing   Sidelying Test Sidelying Right;Sidelying Left   Horizontal Canal Testing Horizontal Canal Right;Horizontal Canal Left     Sidelying Right   Sidelying Right Duration NA   Sidelying Right Symptoms No nystagmus     Sidelying Left   Sidelying Left Duration NA   Sidelying Left Symptoms No nystagmus     Horizontal Canal Right   Horizontal Canal  Right Duration NA   Horizontal Canal Right Symptoms Normal     Horizontal Canal Left   Horizontal Canal Left Duration 2/5 symptoms of dizziness and nausea; dissipates in minutes   Horizontal Canal Left Symptoms Normal     Horizontal Canal Right Intensity   Horizontal Canal Right Intensity --  NA   Right Intensity Comment No symptoms     Horizontal Canal Left Intensity   Horizontal Canal Left Intensity Mild     Equities trader Comment Composite score = 29% compared to age/height normative value 63%. Sensory Analysis: Visual = 20% (age/height norm = 74%); Vestibular = 0% (norm = 50%. Findings suggest pt ability to use visual and vestibular input  for balance are significantly below average for age/height. 8 "falls" sustained, 2 during Condition 4, and 3 falls during both Conditions 5 and 6. Ankle strategy dominant during Conditions 4, 5, and 6. COG alignment: to R of midline with posterior preference.                  [facility] Adult PT Treatment/Exercise - 04/18/16 0001      Ambulation/Gait   Ambulation/Gait Yes   Ambulation/Gait Assistance 5: Supervision   Ambulation/Gait Assistance Details Linear gait 2 x150' without AD, no overt LOB, intermittent veering to R/L.   Ambulation Distance (Feet) 300 Feet   Assistive device None   Gait Pattern --  turns en bloc   Ambulation Surface Level;Indoor         Vestibular Treatment/Exercise - 04/18/16 0001      Vestibular Treatment/Exercise   Vestibular Treatment Provided Habituation     Horizontal Roll   Number of Reps  2   Symptom Description  Reviewed technique x2 trials; pt with effective between-session carryover.            Balance Exercises - 04/18/16 1348      Balance Exercises: Standing   Standing Eyes Opened Wide (BOA);Head turns;Foam/compliant surface;5 reps  1 pillow; horiz, vertical head turns x5 reps each   Standing Eyes Closed Wide (BOA);Solid surface;2 reps;30 secs  feet slightly closer than shoulder-width apart           PT Education - 04/18/16 1349    Education provided Yes   Education Details SOT findings, implications. PT goals, progress, and POC.  HEP modified to address impairments outlined by SOT.   Person(s) Educated Patient   Methods Explanation;Demonstration;Verbal cues;Handout   Comprehension Returned demonstration;Verbalized understanding          PT Short Term Goals - 04/18/16 1351      PT SHORT TERM GOAL #1   Title Pt will improve SOT composite score from 29% to >/= 33% to progress toward significant improvement in use of multi-sensory input for balance.  (Target: 05/16/16)     PT SHORT TERM GOAL #2   Title SOT Visual score  will improve from 20% to >/= 45% to indicate significant improvement in use of visual input for balance. (05/16/16)     PT SHORT TERM GOAL #3   Title SOT Vestibular score will improve from 0% to >/= 25% to indicate significant improvement in use of vestibular input for balance. (05/16/16)           PT Long Term Goals - 04/18/16 1353      PT LONG TERM GOAL #1   Title Pt will independently perform vestibular HEP to maximize functional gains made in PT.  (Target date: 03/31/16)  Baseline Met 8/22.   Status Achieved     PT LONG TERM GOAL #2   Title Positional vertigo testing will be negative to indicate resolved BPPV.    Baseline Met 7/14.   Status Achieved     PT LONG TERM GOAL #3   Title Pt will decrease DHI score from 100 to 82 to indicate significant decrease in pt-perceived disability due to dizziness. (Modified target date: 06/13/16)   Baseline Continue goal through renewed POC.   Status On-going     PT LONG TERM GOAL #4   Title Assess strength and balance, if indicated, after vertigo clears.    Baseline Met 8/22.   Status Achieved     PT LONG TERM GOAL #5   Title Pt will improve SOT composite score from 29% to >/= 37%% to indicate significant improvement in use of multi-sensory input for balance.  (Target: 06/13/16)   Status New     Additional Long Term Goals   Additional Long Term Goals Yes     PT LONG TERM GOAL #6   Title SOT Visual score will improve from 20% to >/= 74% to indicate normalized use of visual input for balance. (06/13/16)   Status New     PT LONG TERM GOAL #7   Title SOT Vestibular score will improve from 0% to >/= 50% to indicate normalized use of vestibular input for balance. (06/13/16)   Status New               Plan - 04/18/16 1356    Clinical Impression Statement BPPV has cleared and pt reports significant improvement in perceived dizziness since beginning this episode of PT. Pt does continue to report imbalance with functional head turns  during mobility as well as nausea and dizziness first thing in the morning. SOT indicates severely impaired use of vestibular input for balance, and significant impairment in use of visual input for balance. Pt will benefit from skilled outpatient PT once every other week for 8 weeks (4 total sessions) to address said impairments.    Rehab Potential Good   PT Frequency Other (comment)  once every other week   PT Duration 8 weeks   PT Treatment/Interventions ADLs/Self Care Home Management;Canalith Repostioning;Vestibular;Gait training;Neuromuscular re-education;Stair training;Functional mobility training;Therapeutic activities;Patient/family education;Therapeutic exercise;Balance training   PT Next Visit Plan GCODES and PN. Progress vestibular HEP prn.   Consulted and Agree with Plan of Care Patient      Patient will benefit from skilled therapeutic intervention in order to improve the following deficits and impairments:  Abnormal gait, Dizziness, Decreased balance  Visit Diagnosis: Other abnormalities of gait and mobility - Plan: PT plan of care cert/re-cert  Unsteadiness on feet - Plan: PT plan of care cert/re-cert  Dizziness and giddiness - Plan: PT plan of care cert/re-cert     Problem List Patient Active Problem List   Diagnosis Date Noted  . Iron deficiency anemia 12/15/2015  . Acute GI bleeding 05/13/2015  . Acute blood loss anemia 05/13/2015  . Normocytic anemia 05/13/2015  . Allergic rhinitis 04/06/2015  . Asthma, chronic 09/08/2013  . Hypoxia 09/08/2013  . HTN (hypertension) 09/08/2013  . Depression 09/08/2013  . DM type 2 with diabetic peripheral neuropathy ([place]) 09/08/2013  . Flu-like symptoms 09/07/2013   Billie Ruddy, PT, DPT [facility] [address] [place], [place], [address] Phone: [phone]   Fax:  [phone] 04/18/16, 2:01 PM  Name: [name] MRN: [id number] Date of Birth: 07/11/1941

## 2016-05-05 ENCOUNTER — Ambulatory Visit: Payer: Medicare Other | Attending: Diagnostic Neuroimaging | Admitting: Physical Therapy

## 2016-05-05 DIAGNOSIS — R2689 Other abnormalities of gait and mobility: Secondary | ICD-10-CM | POA: Diagnosis not present

## 2016-05-05 DIAGNOSIS — R42 Dizziness and giddiness: Secondary | ICD-10-CM | POA: Diagnosis present

## 2016-05-05 DIAGNOSIS — R2681 Unsteadiness on feet: Secondary | ICD-10-CM | POA: Diagnosis present

## 2016-05-05 NOTE — Therapy (Addendum)
[facility] [address] [place], [place], [address] Phone: [phone]   Fax:  [phone]  Physical Therapy Treatment  and Discharge Summary   Patient Details  Name: [name] MRN: [id number] Date of Birth: [birth year] Referring Provider: Sarina Ill, MD  Encounter Date: 05/05/2016      PT End of Session - 05/05/16 1130    Visit Number 10   Number of Visits 13   Date for PT Re-Evaluation 06/17/16   Authorization Type UHC MCR   Authorization Time Period G Codes required   PT Start Time 1018   PT Stop Time 1058   PT Time Calculation (min) 40 min   Activity Tolerance Patient tolerated treatment well   Behavior During Therapy [facility] for tasks assessed/performed      Past Medical History:  Diagnosis Date  . Arthritis   . Asthma   . Diabetes mellitus   . GERD (gastroesophageal reflux disease)   . Heart murmur   . Hypertension   . PONV (postoperative nausea and vomiting)   . S/P appy     Past Surgical History:  Procedure Laterality Date  . APPENDECTOMY  1952  . HAND SURGERY Left 2010   operated on index and ring finger  . INCONTINENCE SURGERY    . JOINT REPLACEMENT Right   . KNEE ARTHROSCOPY  2003 2005   right knee  . mass fallopian tube    . SPINAL CORD STIMULATOR BATTERY EXCHANGE N/A 09/09/2015   Procedure: SPINAL CORD STIMULATOR BATTERY EXCHANGE;  Surgeon: Melina Schools, MD;  Location: [facility];  Service: Orthopedics;  Laterality: N/A;  . VAGINAL HYSTERECTOMY  1977    There were no vitals filed for this visit.      Subjective Assessment - 05/05/16 1022    Subjective Pt reports no true dizziness, no nausea for some time now. States, "I've been doing those exercises religiously." Also reports having recently had a reaction to Cymbalta, which is the medication pt perceives to be the most helpful. Pt is taking Benadryl at this time, per MD recommendation.    Pertinent History PMH significant for: DM,  HTN, asthma, GERD, B cataract extraction, depression.   Diagnostic tests CT of head (02/23/26): No evidence of acute intracranial abnormality or mass. Moderate chronic small vessel ischemic disease.   Patient Stated Goals "I want to get rid of this dizziness and nausea."   Currently in Pain? No/denies                         [facility] Adult PT Treatment/Exercise - 05/05/16 0001      Ambulation/Gait   Ambulation/Gait Yes   Ambulation/Gait Assistance 6: Modified independent (Device/Increase time);5: Supervision;4: Min guard   Ambulation/Gait Assistance Details Mod I (no AD, increased time) for gait x300' over level, indoor surfaces without head turns; (S) gait over unlevel, paved surfaces x500'. During gait with both vertical and horizontal head turns, pt required close (S) to min guard (on level indoor and unlevel, outdoor surfaces).   Ambulation Distance (Feet) 800 Feet  total   Assistive device None   Ambulation Surface Level;Unlevel;Indoor;Outdoor;Paved   Ramp 5: Supervision   Ramp Details (indicate cue type and reason) no AD   Curb 5: Supervision   Curb Details (indicate cue type and reason) no AD             Balance Exercises - 05/05/16 1127      Balance Exercises: Standing  Standing Eyes Opened Wide (BOA);Head turns;Foam/compliant surface;5 reps  1 pillow; horiz, vertical turns x5 each   Standing Eyes Closed Wide (BOA);Head turns;Foam/compliant surface;5 reps;Solid surface;Other (comment)  static, solid x30 sec w/o difficulty; 1 pillow w/ head turns   Wall Bumps Hip   Wall Bumps-Hips Eyes opened;Eyes closed;Anterior/posterior;Foam/compliant surface;5 reps;Other (comment);10 reps  EO then EC, solid x5 reps each; EC on 1 pillow x10 reps   Gait with Head Turns Forward;4 reps  horiz, vert head turns 2 x50' each w/ min guard           PT Education - 05/05/16 1124    Education provided Yes   Education Details HEP progressed; see Pt Instructions.     Person(s) Educated Patient   Methods Explanation;Handout;Verbal cues   Comprehension Returned demonstration;Verbalized understanding          PT Short Term Goals - 04/18/16 1351      PT SHORT TERM GOAL #1   Title Pt will improve SOT composite score from 29% to >/= 33% to progress toward significant improvement in use of multi-sensory input for balance.  (Target: 05/16/16)     PT SHORT TERM GOAL #2   Title SOT Visual score will improve from 20% to >/= 45% to indicate significant improvement in use of visual input for balance. (05/16/16)     PT SHORT TERM GOAL #3   Title SOT Vestibular score will improve from 0% to >/= 25% to indicate significant improvement in use of vestibular input for balance. (05/16/16)           PT Long Term Goals - 04/18/16 1353      PT LONG TERM GOAL #1   Title Pt will independently perform vestibular HEP to maximize functional gains made in PT.  (Target date: 03/31/16)   Baseline Met 8/22.   Status Achieved     PT LONG TERM GOAL #2   Title Positional vertigo testing will be negative to indicate resolved BPPV.    Baseline Met 7/14.   Status Achieved     PT LONG TERM GOAL #3   Title Pt will decrease DHI score from 100 to 82 to indicate significant decrease in pt-perceived disability due to dizziness. (Modified target date: 06/13/16)   Baseline Continue goal through renewed POC.   Status On-going     PT LONG TERM GOAL #4   Title Assess strength and balance, if indicated, after vertigo clears.    Baseline Met 8/22.   Status Achieved     PT LONG TERM GOAL #5   Title Pt will improve SOT composite score from 29% to >/= 37%% to indicate significant improvement in use of multi-sensory input for balance.  (Target: 06/13/16)   Status New     Additional Long Term Goals   Additional Long Term Goals Yes     PT LONG TERM GOAL #6   Title SOT Visual score will improve from 20% to >/= 74% to indicate normalized use of visual input for balance. (06/13/16)    Status New     PT LONG TERM GOAL #7   Title SOT Vestibular score will improve from 0% to >/= 50% to indicate normalized use of vestibular input for balance. (06/13/16)   Status New               Plan - 05/05/16 1132    Clinical Impression Statement Session focused on assessing symptoms, postural stability with balance/vestibular HEP and progressing, as appropriate. Bloomer has improved from 100 to 24,  suggesting significant improvement in pt-perceived disability due to dizziness. Pt continues to report occasional symptoms with horizontal rolling; therefore, recommended pt continue rolling for habituation. Progressed corner balance HEP, as postural stability with vestibular reliance has improved markedly.    Rehab Potential Good   PT Frequency Other (comment)  once every other week   PT Duration 8 weeks   PT Treatment/Interventions ADLs/Self Care Home Management;Canalith Repostioning;Vestibular;Gait training;Neuromuscular re-education;Stair training;Functional mobility training;Therapeutic activities;Patient/family education;Therapeutic exercise;Balance training   PT Next Visit Plan Assess STG's (SOT). Initiate new GCODE category (Mobility) based on SOT findings.   Consulted and Agree with Plan of Care Patient      Patient will benefit from skilled therapeutic intervention in order to improve the following deficits and impairments:  Abnormal gait, Dizziness, Decreased balance  Visit Diagnosis: Other abnormalities of gait and mobility  Unsteadiness on feet  Dizziness and giddiness       G-Codes - 2016-05-07 1059    Functional Assessment Tool Used DHI = 24   Functional Limitation Self care   Self Care Current Status (T6546) --   Self Care Goal Status (T0354) At least 60 percent but less than 80 percent impaired, limited or restricted   Self Care Discharge Status [phone]) At least 20 percent but less than 40 percent impaired, limited or restricted      Problem List Patient  Active Problem List   Diagnosis Date Noted  . Iron deficiency anemia 12/15/2015  . Acute GI bleeding 05/13/2015  . Acute blood loss anemia 05/13/2015  . Normocytic anemia 05/13/2015  . Allergic rhinitis 04/06/2015  . Asthma, chronic 09/08/2013  . Hypoxia 09/08/2013  . HTN (hypertension) 09/08/2013  . Depression 09/08/2013  . DM type 2 with diabetic peripheral neuropathy ([place]) 09/08/2013  . Flu-like symptoms 09/07/2013   Physical Therapy Progress Note  Dates of Reporting Period: 03/03/16 to 2016/05/07  Objective Reports of Subjective Statement:  See Subjective section above for details.  Objective Measurements: DHI = 24  (improved from 100% on PT evaluation)  Goal Update: See above.  Plan: Continue per POC.  Reason Skilled Services are Required: To maximize stability/independence with functional mobility and decrease fall risk.    Billie Ruddy, PT, DPT [facility] [address] [place], [place], [address] Phone: [phone]   Fax:  [phone] 07-May-2016, 11:36 AM  Name: [name] MRN: [id number] Date of Birth: 05/10/1941    PHYSICAL THERAPY DISCHARGE SUMMARY  Visits from Start of Care: 10  Current functional level related to goals / functional outcomes: Unknown, as patient did not return to PT after initial 10 sessions.    Remaining deficits: Unknown, as patient did not return to PT after initial 10 sessions.    Education / Equipment: See above.  Plan:                                                    Patient goals were partially met. Patient is being discharged due to not returning since the last visit.  ?????        Billie Ruddy, PT, DPT [facility] [address] [place], [place], [address] Phone: [phone]   Fax:  [phone] 07/19/16, 10:31 AM

## 2016-05-05 NOTE — Patient Instructions (Addendum)
Tip Card 1.The goal of habituation training is to assist in decreasing symptoms of vertigo, dizziness, or nausea provoked by specific head and body motions. 2.These exercises may initially increase symptoms; however, be persistent and work through symptoms. With repetition and time, the exercises will assist in reducing or eliminating symptoms. 3.Exercises should be stopped and discussed with the therapist if you experience any of the following: - Sudden change or fluctuation in hearing - New onset of ringing in the ears, or increase in current intensity - Any fluid discharge from the ear - Severe pain in neck or back - Extreme nausea  Copyright  VHI. All rights reserved. Rolling   With pillow under head, start on back. Roll to your right side. Hold until dizziness stops, plus 20 seconds and then roll to the left side. Hold until dizziness stops, plus 20 seconds. Repeat sequence 5 times per session. Do 2 sessions per day.   Perform the following exercises with your back to a corner with stable chair in front of you:  Feet Apart (Compliant Surface) Head Motion - Eyes Open/Closed    Perform the following while standing on 1 pillow with feet shoulder width apart:  - With eyes open: move head slowly: up and down 5 times; right to left 5 times.   - With eyes closed: move head slowly: up and down 5 times; right to left 5 times.  Do __2__ sessions per day.

## 2016-05-17 ENCOUNTER — Encounter: Payer: Medicare Other | Admitting: Physical Therapy

## 2016-05-22 ENCOUNTER — Encounter: Payer: Medicare Other | Admitting: Physical Therapy

## 2016-05-29 ENCOUNTER — Encounter: Payer: Medicare Other | Admitting: Physical Therapy

## 2016-06-05 ENCOUNTER — Encounter: Payer: Medicare Other | Admitting: Physical Therapy

## 2016-06-12 ENCOUNTER — Ambulatory Visit: Payer: Medicare Other | Admitting: Physical Therapy

## 2016-06-13 ENCOUNTER — Ambulatory Visit
Admission: RE | Admit: 2016-06-13 | Discharge: 2016-06-13 | Disposition: A | Discharge status: Home / Self Care | Payer: Medicare Other | Source: Ambulatory Visit | Attending: Family Medicine | Admitting: Family Medicine

## 2016-06-13 DIAGNOSIS — R911 Solitary pulmonary nodule: Secondary | ICD-10-CM

## 2016-06-14 ENCOUNTER — Other Ambulatory Visit: Payer: Self-pay | Admitting: Gastroenterology

## 2016-06-14 ENCOUNTER — Encounter (HOSPITAL_COMMUNITY): Payer: Self-pay | Admitting: *Deleted

## 2016-06-15 ENCOUNTER — Ambulatory Visit (HOSPITAL_COMMUNITY): Payer: Medicare Other | Admitting: Anesthesiology

## 2016-06-15 ENCOUNTER — Encounter (HOSPITAL_COMMUNITY): Payer: Self-pay

## 2016-06-15 ENCOUNTER — Ambulatory Visit (HOSPITAL_COMMUNITY)
Admission: RE | Admit: 2016-06-15 | Discharge: 2016-06-15 | Disposition: A | Discharge status: Home / Self Care | Payer: Medicare Other | Source: Ambulatory Visit | Attending: Gastroenterology | Admitting: Gastroenterology

## 2016-06-15 ENCOUNTER — Encounter (HOSPITAL_COMMUNITY)
Admission: RE | Disposition: A | Discharge status: Home / Self Care | Payer: Self-pay | Source: Ambulatory Visit | Attending: Gastroenterology

## 2016-06-15 DIAGNOSIS — D500 Iron deficiency anemia secondary to blood loss (chronic): Secondary | ICD-10-CM | POA: Insufficient documentation

## 2016-06-15 DIAGNOSIS — K449 Diaphragmatic hernia without obstruction or gangrene: Secondary | ICD-10-CM | POA: Diagnosis not present

## 2016-06-15 DIAGNOSIS — K31819 Angiodysplasia of stomach and duodenum without bleeding: Secondary | ICD-10-CM | POA: Insufficient documentation

## 2016-06-15 DIAGNOSIS — R1012 Left upper quadrant pain: Secondary | ICD-10-CM | POA: Diagnosis present

## 2016-06-15 HISTORY — PX: ESOPHAGOGASTRODUODENOSCOPY (EGD) WITH PROPOFOL: SHX5813

## 2016-06-15 LAB — GLUCOSE, CAPILLARY
GLUCOSE-CAPILLARY: 96 mg/dL (ref 65–99)
GLUCOSE-CAPILLARY: 86 mg/dL (ref 65–99)

## 2016-06-15 SURGERY — ESOPHAGOGASTRODUODENOSCOPY (EGD) WITH PROPOFOL
Anesthesia: Monitor Anesthesia Care

## 2016-06-15 MED ORDER — LIDOCAINE 2% (20 MG/ML) 5 ML SYRINGE
INTRAMUSCULAR | Status: AC
  Filled 2016-06-15: qty 5

## 2016-06-15 MED ORDER — PROPOFOL 500 MG/50ML IV EMUL
INTRAVENOUS | Status: DC | PRN
  Administered 2016-06-15: 125 ug/kg/min via INTRAVENOUS

## 2016-06-15 MED ORDER — PROPOFOL 10 MG/ML IV BOLUS
INTRAVENOUS | Status: AC
  Filled 2016-06-15: qty 40

## 2016-06-15 MED ORDER — LACTATED RINGERS IV SOLN
INTRAVENOUS | Status: DC
  Administered 2016-06-15: 1000 mL via INTRAVENOUS

## 2016-06-15 MED ORDER — SODIUM CHLORIDE 0.9 % IV SOLN: INTRAVENOUS | Status: DC

## 2016-06-15 MED ORDER — PROPOFOL 500 MG/50ML IV EMUL
INTRAVENOUS | Status: DC | PRN
  Administered 2016-06-15: 30 mg via INTRAVENOUS

## 2016-06-15 SURGICAL SUPPLY — 14 items

## 2016-06-15 NOTE — Discharge Instructions (Signed)
Gastrointestinal Endoscopy, Care After Refer to this sheet in the next few weeks. These instructions provide you with information on caring for yourself after your procedure. Your caregiver may also give you more specific instructions. Your treatment has been planned according to current medical practices, but problems sometimes occur. Call your caregiver if you have any problems or questions after your procedure. HOME CARE INSTRUCTIONS  If you were given medicine to help you relax (sedative), do not drive, operate machinery, or sign important documents for 24 hours.  Avoid alcohol and hot or warm beverages for the first 24 hours after the procedure.  Only take over-the-counter or prescription medicines for pain, discomfort, or fever as directed by your caregiver. You may resume taking your normal medicines unless your caregiver tells you otherwise. Ask your caregiver when you may resume taking medicines that may cause bleeding, such as aspirin, clopidogrel, or warfarin.  You may return to your normal diet and activities on the day after your procedure, or as directed by your caregiver. Walking may help to reduce any bloated feeling in your abdomen.  Drink enough fluids to keep your urine clear or pale yellow.  You may gargle with salt water if you have a sore throat. SEEK IMMEDIATE MEDICAL CARE IF:  You have severe nausea or vomiting.  You have severe abdominal pain, abdominal cramps that last longer than 6 hours, or abdominal swelling (distention).  You have severe shoulder or back pain.  You have trouble swallowing.  You have shortness of breath, your breathing is shallow, or you are breathing faster than normal.  You have a fever or a rapid heartbeat.  You vomit blood or material that looks like coffee grounds.  You have bloody, black, or tarry stools. MAKE SURE YOU:  Understand these instructions.  Will watch your condition.  Will get help right away if you are not doing  well or get worse.   This information is not intended to replace advice given to you by your health care provider. Make sure you discuss any questions you have with your health care provider.   Document Released: 03/28/2004 Document Revised: 09/04/2014 Document Reviewed: 11/14/2011 Elsevier Interactive Patient Education 2016 [employer]. Call if question or problem otherwise follow-up in 3 weeks to recheck symptoms and CBC and decide any other workup and plans

## 2016-06-15 NOTE — Progress Notes (Signed)
[name] 11:51 AM  Subjective: Patient is doing well without new complaints and feels even better than she did yesterday in the office  Objective: Vital signs stable afebrile no acute distress exam please see preassessment evaluation  Assessment: Iron deficiency probably secondary to gave  Plan: Okay to proceed with endoscopy possible APC with anesthesia assistance  [facility]  Pager [phone] After 5PM or if no answer call [phone]

## 2016-06-15 NOTE — Transfer of Care (Signed)
Immediate Anesthesia Transfer of Care Note  Patient: [name]  Procedure(s) Performed: Procedure(s): ESOPHAGOGASTRODUODENOSCOPY (EGD) WITH PROPOFOL (N/A)  Patient Location: PACU  Anesthesia Type:MAC  Level of Consciousness: awake, alert  and oriented  Airway & Oxygen Therapy: Patient Spontanous Breathing and Patient connected to nasal cannula oxygen  Post-op Assessment: Report given to RN and Post -op Vital signs reviewed and stable  Post vital signs: Reviewed and stable  Last Vitals:  Vitals:   06/15/16 1108  BP: (!) 130/37  Pulse: 60  Resp: 13  Temp: 36.6 C    Last Pain:  Vitals:   06/15/16 1108  TempSrc: Oral         Complications: No apparent anesthesia complications

## 2016-06-15 NOTE — Anesthesia Preprocedure Evaluation (Signed)
Anesthesia Evaluation  Patient identified by MRN, date of birth, ID band Patient awake    Reviewed: Allergy & Precautions, NPO status , Patient's Chart, lab work & pertinent test results  Airway Mallampati: II  TM Distance: >3 FB Neck ROM: Full    Dental no notable dental hx.    Pulmonary neg pulmonary ROS,    Pulmonary exam normal breath sounds clear to auscultation       Cardiovascular hypertension, Normal cardiovascular exam Rhythm:Regular Rate:Normal     Neuro/Psych negative neurological ROS  negative psych ROS   GI/Hepatic Neg liver ROS, GERD  ,  Endo/Other  diabetes, Insulin Dependent  Renal/GU negative Renal ROS  negative genitourinary   Musculoskeletal negative musculoskeletal ROS (+)   Abdominal   Peds negative pediatric ROS (+)  Hematology negative hematology ROS (+)   Anesthesia Other Findings   Reproductive/Obstetrics negative OB ROS                             Anesthesia Physical Anesthesia Plan  ASA: III  Anesthesia Plan: MAC   Post-op Pain Management:    Induction: Intravenous  Airway Management Planned: Nasal Cannula  Additional Equipment:   Intra-op Plan:   Post-operative Plan:   Informed Consent: I have reviewed the patients History and Physical, chart, labs and discussed the procedure including the risks, benefits and alternatives for the proposed anesthesia with the patient or authorized representative who has indicated his/her understanding and acceptance.   Dental advisory given  Plan Discussed with: CRNA and Surgeon  Anesthesia Plan Comments:         Anesthesia Quick Evaluation

## 2016-06-15 NOTE — Op Note (Signed)
[facility] Patient Name: [name] Procedure Date: 06/15/2016 MRN: [id number] Attending MD: Clarene Essex , MD Date of Birth: 1941/03/06 CSN: [id number] Age: 75 Admit Type: Outpatient Procedure:                Upper GI endoscopy Indications:              Abdominal pain in the left upper quadrant, Iron                            deficiency anemia secondary to chronic blood loss,                            Nausea Providers:                Clarene Essex, MD, Elmer Ramp. Tilden Dome, RN, Alfonso Patten,                            Technician, Herbie Drape, CRNA Referring MD:              Medicines:                Propofol total dose [id number] mg IV Complications:            No immediate complications. Estimated Blood Loss:     Estimated blood loss: none. Procedure:                Pre-Anesthesia Assessment:                           - Prior to the procedure, a History and Physical                            was performed, and patient medications and                            allergies were reviewed. The patient's tolerance of                            previous anesthesia was also reviewed. The risks                            and benefits of the procedure and the sedation                            options and risks were discussed with the patient.                            All questions were answered, and informed consent                            was obtained. Prior Anticoagulants: The patient has                            taken no previous anticoagulant or antiplatelet  agents. ASA Grade Assessment: II - A patient with                            mild systemic disease. After reviewing the risks                            and benefits, the patient was deemed in                            satisfactory condition to undergo the procedure.                           After obtaining informed consent, the endoscope was                            passed under direct  vision. Throughout the                            procedure, the patient's blood pressure, pulse, and                            oxygen saturations were monitored continuously. The                            Endoscope was introduced through the mouth, and                            advanced to the third part of duodenum. The upper                            GI endoscopy was accomplished without difficulty.                            The patient tolerated the procedure well. Scope In: Scope Out: Findings:      The larynx was normal.      A Tiny hiatal hernia was present.      Multiple no bleeding angiodysplastic lesions were found in the gastric       antrum. Fulguration to ablate the lesion by argon plasma at 1       liter/minute and 20 watts was successful.      The duodenal bulb, first portion of the duodenum, second portion of the       duodenum and third portion of the duodenum were normal.      The exam was otherwise without abnormality. Impression:               - Normal larynx.                           - Tiny hiatal hernia.                           - Multiple non-bleeding angiodysplastic lesions in                            the  stomach. Treated with argon plasma coagulation                            (APC).                           - Normal duodenal bulb, first portion of the                            duodenum, second portion of the duodenum and third                            portion of the duodenum.                           - The examination was otherwise normal.                           - No specimens collected. Moderate Sedation:      N/A- Per Anesthesia Care Recommendation:           - Patient has a contact number available for                            emergencies. The signs and symptoms of potential                            delayed complications were discussed with the                            patient. Return to normal activities tomorrow.                             Written discharge instructions were provided to the                            patient.                           - Soft diet today.                           - Continue present medications.                           - Return to GI clinic in 3 weeks.                           - Telephone GI clinic if symptomatic PRN. Procedure Code(s):        --- Professional ---                           [phone], Esophagogastroduodenoscopy, flexible,                            transoral; with ablation of tumor(s), polyp(s), or  other lesion(s) (includes pre- and post-dilation                            and guide wire passage, when performed) Diagnosis Code(s):        --- Professional ---                           K44.9, Diaphragmatic hernia without obstruction or                            gangrene                           K31.819, Angiodysplasia of stomach and duodenum                            without bleeding                           R10.12, Left upper quadrant pain                           D50.0, Iron deficiency anemia secondary to blood                            loss (chronic)                           R11.0, Nausea CPT copyright 2016 American Medical Association. All rights reserved. The codes documented in this report are preliminary and upon coder review may  be revised to meet current compliance requirements. Clarene Essex, MD 06/15/2016 12:39:24 PM This report has been signed electronically. Number of Addenda: 0

## 2016-06-15 NOTE — Anesthesia Postprocedure Evaluation (Signed)
Anesthesia Post Note  Patient: [name]  Procedure(s) Performed: Procedure(s) (LRB): ESOPHAGOGASTRODUODENOSCOPY (EGD) WITH PROPOFOL (N/A)  Patient location during evaluation: PACU Anesthesia Type: MAC Level of consciousness: awake and alert Pain management: pain level controlled Vital Signs Assessment: post-procedure vital signs reviewed and stable Respiratory status: spontaneous breathing, nonlabored ventilation, respiratory function stable and patient connected to nasal cannula oxygen Cardiovascular status: stable and blood pressure returned to baseline Anesthetic complications: no    Last Vitals:  Vitals:   06/15/16 1227 06/15/16 1229  BP: (!) 113/42 (!) 113/42  Pulse:  (!) 59  Resp: (!) 23 14  Temp: 36.4 C 36.7 C    Last Pain:  Vitals:   06/15/16 1229  TempSrc: Oral                 Alexzavier Girardin S

## 2016-06-19 ENCOUNTER — Encounter (HOSPITAL_COMMUNITY): Payer: Self-pay | Admitting: Gastroenterology

## 2016-07-12 ENCOUNTER — Telehealth: Payer: Self-pay | Admitting: Oncology

## 2016-07-12 NOTE — Telephone Encounter (Signed)
Ms. Boevers is an est pt of Shadad's. A referral was sent over for the pt to be evaluated for anemia. Appt scheduled w/ Shadad on 12/22 at 10am. Pt agreed to the appt date and time.

## 2016-08-18 ENCOUNTER — Ambulatory Visit (HOSPITAL_BASED_OUTPATIENT_CLINIC_OR_DEPARTMENT_OTHER): Payer: Medicare Other

## 2016-08-18 ENCOUNTER — Telehealth: Payer: Self-pay | Admitting: Oncology

## 2016-08-18 ENCOUNTER — Ambulatory Visit (HOSPITAL_BASED_OUTPATIENT_CLINIC_OR_DEPARTMENT_OTHER): Payer: Medicare Other | Admitting: Oncology

## 2016-08-18 VITALS — BP 148/64 | HR 74 | Temp 98.0°F | Resp 18 | Wt 156.7 lb

## 2016-08-18 DIAGNOSIS — D500 Iron deficiency anemia secondary to blood loss (chronic): Secondary | ICD-10-CM

## 2016-08-18 DIAGNOSIS — K31819 Angiodysplasia of stomach and duodenum without bleeding: Secondary | ICD-10-CM | POA: Diagnosis not present

## 2016-08-18 DIAGNOSIS — D509 Iron deficiency anemia, unspecified: Secondary | ICD-10-CM | POA: Diagnosis not present

## 2016-08-18 LAB — IRON AND TIBC
%SAT: 11 % — ABNORMAL LOW (ref 21–57)
IRON: 42 ug/dL (ref 41–142)
TIBC: 400 ug/dL (ref 236–444)
UIBC: 358 ug/dL (ref 120–384)

## 2016-08-18 LAB — CBC WITH DIFFERENTIAL/PLATELET
BASO%: 0.7 % (ref 0.0–2.0)
Basophils Absolute: 0.1 10*3/uL (ref 0.0–0.1)
EOS%: 2.4 % (ref 0.0–7.0)
Eosinophils Absolute: 0.2 10*3/uL (ref 0.0–0.5)
HCT: 36.8 % (ref 34.8–46.6)
HGB: 11.5 g/dL — ABNORMAL LOW (ref 11.6–15.9)
LYMPH%: 21.6 % (ref 14.0–49.7)
MCH: 24.6 pg — ABNORMAL LOW (ref 25.1–34.0)
MCHC: 31.2 g/dL — ABNORMAL LOW (ref 31.5–36.0)
MCV: 78.9 fL — AB (ref 79.5–101.0)
MONO#: 0.7 10*3/uL (ref 0.1–0.9)
MONO%: 8.7 % (ref 0.0–14.0)
NEUT%: 66.6 % (ref 38.4–76.8)
NEUTROS ABS: 5.4 10*3/uL (ref 1.5–6.5)
Platelets: 244 10*3/uL (ref 145–400)
RBC: 4.66 10*6/uL (ref 3.70–5.45)
RDW: 22.4 % — ABNORMAL HIGH (ref 11.2–14.5)
WBC: 8 10*3/uL (ref 3.9–10.3)
lymph#: 1.7 10*3/uL (ref 0.9–3.3)

## 2016-08-18 LAB — FERRITIN: FERRITIN: 10 ng/mL (ref 9–269)

## 2016-08-18 NOTE — Progress Notes (Signed)
Hematology and Oncology Follow Up Visit  [name] [id number] 30-Aug-1940 75 y.o. 08/18/2016 9:51 AM [name], [name], [name], MD   Principle Diagnosis: 75 year old woman with iron deficiency anemia diagnosed in April 2017. Her iron deficiency is related to angiodysplasia.  Prior Therapy:  She is status post IV iron infusion in the form of Feraheme obtained on 12/20/2015.  She is also status post ablation of gastric angiodysplasia done by Dr. Watt Climes on 06/15/2016.  Current therapy: Oral iron therapy  Interim History: [name] presents today for a follow-up visit. Since the last visit, she underwent intravenous iron in April 2017 and tolerated it well. Although she continued to feel poorly and developed worsening anemia with a hemoglobin down to 8 with positive Hemoccult testing. She underwent a repeat endoscopy which showed multiple nonbleeding angiodysplastic lesions in the stomach. She was treated with argon plasma coagulation by Dr. Watt Climes on 06/25/2016. A repeat CBC in November 2017 showed a hemoglobin of 11.5 with an MCV of 71. Clinically, she reports her symptoms are much improved at this time. She is no longer reporting fatigue, tiredness and does not have any dyspnea and exertion. She denied any hematochezia or melena. Appetite is excellent and continues to attend activities of daily living.  She does not report any headaches, blurry vision, syncope or seizures. She is not report any fevers, chills or sweats. She does not report any cough, wheezing or hemoptysis. She does not report any chest pain, palpitation orthopnea. She does not report any nausea, vomiting or abdominal pain. She does not report any frequency urgency or hesitancy. Does not report any skeletal complaints. Remaining review of systems unremarkable.  Medications: I have reviewed the patient's current medications.  Current Outpatient Prescriptions  Medication Sig Dispense Refill  . albuterol  (PROAIR HFA) 108 (90 BASE) MCG/ACT inhaler Inhale 2 puffs into the lungs every 4 (four) hours as needed for wheezing or shortness of breath. 1 Inhaler 3  . albuterol (PROVENTIL) (2.5 MG/3ML) 0.083% nebulizer solution Take 3 mLs (2.5 mg total) by nebulization every 6 (six) hours as needed for wheezing or shortness of breath. 75 mL 12  . ALPRAZolam (XANAX) 0.5 MG tablet Take 1 tablet (0.5 mg total) by mouth 3 (three) times daily as needed for anxiety. Start with 1/2 pill. Watch for sedation do not drive. 60 tablet 3  . amLODipine (NORVASC) 10 MG tablet Take 10 mg by mouth daily.    . budesonide-formoterol (SYMBICORT) 160-4.5 MCG/ACT inhaler Inhale 2 puffs into the lungs 2 (two) times daily. (Patient taking differently: Inhale 2 puffs into the lungs 2 (two) times daily as needed (Wheezing, cough). ) 1 Inhaler 6  . diphenoxylate-atropine (LOMOTIL) 2.5-0.025 MG tablet Take 1 tablet by mouth 4 (four) times daily as needed for diarrhea or loose stools. 15 tablet 0  . docusate sodium (COLACE) 100 MG capsule Take 1 capsule (100 mg total) by mouth every 12 (twelve) hours. 30 capsule 0  . esomeprazole (NEXIUM) 40 MG capsule Take 40 mg by mouth daily.  0  . famotidine (PEPCID) 20 MG tablet Take 1 tablet (20 mg total) by mouth 2 (two) times daily. 30 tablet 0  . FeAsp-B12-FA-C-DSS-SuccAc-Zn (FERIVA 21/7 PO) Take by mouth. 2 tabs per day    . gabapentin (NEURONTIN) 300 MG capsule Take 600 mg by mouth 3 (three) times daily.     Marland Kitchen guaiFENesin-dextromethorphan (ROBITUSSIN DM) 100-10 MG/5ML syrup Take 5 mLs by mouth every 4 (four) hours as needed for cough. (Patient not taking: Reported  on 02/11/2016) 118 mL 1  . HYDROcodone-acetaminophen (NORCO) 5-325 MG tablet Take 1-2 tablets by mouth every 6 (six) hours as needed for moderate pain. (Patient not taking: Reported on 03/03/2016) 20 tablet 0  . Insulin Glargine (LANTUS SOLOSTAR [place]) Inject 58 Units into the skin at bedtime.     . insulin lispro (HUMALOG) 100 UNIT/ML SOCT  Inject 22-28 Units into the skin 3 (three) times daily with meals. Sliding scale    . LORazepam (ATIVAN) 1 MG tablet Take 1 tablet (1 mg total) by mouth 3 (three) times daily as needed for anxiety. 15 tablet 0  . losartan (COZAAR) 100 MG tablet Take 100 mg by mouth daily.    . meclizine (ANTIVERT) 25 MG tablet 25 mg. Reported on 03/03/2016  0  . methocarbamol (ROBAXIN) 500 MG tablet Take 1 tablet (500 mg total) by mouth 3 (three) times daily as needed for muscle spasms. 60 tablet 0  . metoCLOPramide (REGLAN) 10 MG tablet Take 1 tablet (10 mg total) by mouth every 6 (six) hours. 30 tablet 0  . metoprolol (LOPRESSOR) 50 MG tablet Take 75 mg by mouth 2 (two) times daily.    . Multiple Vitamins-Minerals (PRESERVISION AREDS 2 PO) Take 1 tablet by mouth 2 (two) times daily.     . ondansetron (ZOFRAN) 4 MG tablet Take 1 tablet (4 mg total) by mouth every 6 (six) hours. 12 tablet 0  . ondansetron (ZOFRAN-ODT) 4 MG disintegrating tablet 1-2 tablets every 4-8 hours 60 tablet 3  . Pitavastatin Calcium (LIVALO) 1 MG TABS Take 1 mg by mouth at bedtime.    . promethazine (PHENERGAN) 25 MG tablet Take 1 tablet (25 mg total) by mouth every 6 (six) hours as needed for nausea or vomiting. (Patient not taking: Reported on 06/15/2016) 20 tablet 0  . triamterene-hydrochlorothiazide (DYAZIDE) 37.5-25 MG per capsule Take 2 capsules by mouth daily after lunch.      No current facility-administered medications for this visit.      Allergies:  Allergies  Allergen Reactions  . Vasotec Shortness Of Breath and Other (See Comments)    wheezing  . Codeine Nausea And Vomiting  . Lansoprazole Itching, Rash and Other (See Comments)    redness  . Morphine And Related Itching  . Sulfate Itching  . Atorvastatin Other (See Comments)    Leg cramps   . Cymbalta [Duloxetine Hcl]     itching  . Adhesive [Tape] Rash  . Scopolamine Rash    Past Medical History, Surgical history, Social history, and Family History were  reviewed and updated.  Physical Exam: Blood pressure (!) 148/64, pulse 74, temperature 98 F (36.7 C), temperature source Oral, resp. rate 18, weight 156 lb 11.2 oz (71.1 kg), SpO2 98 %. ECOG: 1 General appearance: alert and cooperative appeared without distress. Head: Normocephalic, without obvious abnormality without oral ulcers or lesions. Neck: no adenopathy Lymph nodes: Cervical, supraclavicular, and axillary nodes normal. Heart:regular rate and rhythm, S1, S2 normal, no murmur, click, rub or gallop Lung:chest clear, no wheezing, rales, normal symmetric air entry.  Abdomin: soft, non-tender, without masses or organomegaly no shifting dullness or ascites. EXT:no erythema, induration, or nodules   Lab Results: Lab Results  Component Value Date   WBC 10.0 02/05/2016   HGB 11.9 (L) 02/05/2016   HCT 37.1 02/05/2016   MCV 83.0 02/05/2016   PLT 266 02/05/2016     Chemistry      Component Value Date/Time   NA 131 (L) 02/05/2016 2014   K  5.0 02/05/2016 2014   CL 97 (L) 02/05/2016 2014   CO2 26 02/05/2016 2014   BUN 24 (H) 02/05/2016 2014   CREATININE 0.85 02/05/2016 2014      Component Value Date/Time   CALCIUM 8.8 (L) 02/05/2016 2014   ALKPHOS 66 02/05/2016 2014   AST 29 02/05/2016 2014   ALT 18 02/05/2016 2014   BILITOT 0.9 02/05/2016 2014       Impression and Plan:  75 year old woman with the following issues:  1. Iron deficiency anemia presented CBC showed a hemoglobin of 9.5 and low MCV of 78 and elevated RDW of 16.8. Her white cell count and platelet count are normal with a normal white cell count and differential. This was diagnosed in April 2017.  She is status post intravenous iron done in April 2017 but developed recurrent iron deficiency attributed to angiodysplasia of the stomach. She is status post APC done in October 2017.   The plan at this point is to repeat her iron studies today and will determine whether she will need to continue on oral iron  moving forward. Her last CBC in November 2017 showed near normalization of her hemoglobin. She is feeling much better at this time and I do anticipate needing intravenous iron. She will likely require to complete oral iron core stability in the year depending on the results of her iron studies.  I recommended periodic monitoring of her iron counts and replace as needed.  2. Angiodysplasia of the stomach: She develops worsening iron deficiency in the future, she might require retreatment. She will continue to follow with Dr. Watt Climes regarding this issue.  3. Follow-up: Will be in 3 months to repeat her iron studies.  [facility], MD 12/22/20179:51 AM

## 2016-08-18 NOTE — Telephone Encounter (Signed)
Appointments scheduled per 12/22 LOS. Patient given AVS report and calendars with future scheduled appointments. °

## 2016-08-24 ENCOUNTER — Telehealth: Payer: Self-pay | Admitting: *Deleted

## 2016-08-24 NOTE — Telephone Encounter (Signed)
Iron is back to normal

## 2016-08-24 NOTE — Telephone Encounter (Signed)
Patient called and would like lab results from 08/18/16. Return number is [phone].

## 2016-08-24 NOTE — Telephone Encounter (Signed)
As noted below by Dr. Shadad, I informed patient of her iron levels. Patient verbalized understanding. 

## 2016-10-09 DIAGNOSIS — H02409 Unspecified ptosis of unspecified eyelid: Secondary | ICD-10-CM | POA: Insufficient documentation

## 2016-10-20 ENCOUNTER — Encounter: Payer: Self-pay | Admitting: *Deleted

## 2016-11-14 ENCOUNTER — Telehealth: Payer: Self-pay | Admitting: Oncology

## 2016-11-14 ENCOUNTER — Ambulatory Visit (HOSPITAL_BASED_OUTPATIENT_CLINIC_OR_DEPARTMENT_OTHER): Payer: Medicare Other | Admitting: Oncology

## 2016-11-14 ENCOUNTER — Other Ambulatory Visit (HOSPITAL_BASED_OUTPATIENT_CLINIC_OR_DEPARTMENT_OTHER): Payer: Medicare Other

## 2016-11-14 VITALS — BP 155/72 | HR 83 | Temp 98.5°F | Resp 18 | Wt 159.9 lb

## 2016-11-14 DIAGNOSIS — D500 Iron deficiency anemia secondary to blood loss (chronic): Secondary | ICD-10-CM

## 2016-11-14 DIAGNOSIS — K31819 Angiodysplasia of stomach and duodenum without bleeding: Secondary | ICD-10-CM

## 2016-11-14 DIAGNOSIS — D509 Iron deficiency anemia, unspecified: Secondary | ICD-10-CM

## 2016-11-14 LAB — CBC WITH DIFFERENTIAL/PLATELET
BASO%: 0.5 % (ref 0.0–2.0)
Basophils Absolute: 0 10*3/uL (ref 0.0–0.1)
EOS ABS: 0.3 10*3/uL (ref 0.0–0.5)
EOS%: 3.1 % (ref 0.0–7.0)
HCT: 39.2 % (ref 34.8–46.6)
HGB: 12.6 g/dL (ref 11.6–15.9)
LYMPH%: 24.5 % (ref 14.0–49.7)
MCH: 28 pg (ref 25.1–34.0)
MCHC: 32.1 g/dL (ref 31.5–36.0)
MCV: 87.1 fL (ref 79.5–101.0)
MONO#: 0.8 10*3/uL (ref 0.1–0.9)
MONO%: 9.3 % (ref 0.0–14.0)
NEUT#: 5.6 10*3/uL (ref 1.5–6.5)
NEUT%: 62.6 % (ref 38.4–76.8)
PLATELETS: 208 10*3/uL (ref 145–400)
RBC: 4.5 10*6/uL (ref 3.70–5.45)
RDW: 15.4 % — ABNORMAL HIGH (ref 11.2–14.5)
WBC: 8.9 10*3/uL (ref 3.9–10.3)
lymph#: 2.2 10*3/uL (ref 0.9–3.3)

## 2016-11-14 LAB — IRON AND TIBC
%SAT: 21 % (ref 21–57)
Iron: 80 ug/dL (ref 41–142)
TIBC: 390 ug/dL (ref 236–444)
UIBC: 310 ug/dL (ref 120–384)

## 2016-11-14 LAB — FERRITIN: Ferritin: 20 ng/ml (ref 9–269)

## 2016-11-14 NOTE — Telephone Encounter (Signed)
Appointments scheduled per 3.20.18. LOS. Patient given AVS report an calendars with future scheduled appointments.

## 2016-11-14 NOTE — Progress Notes (Signed)
Hematology and Oncology Follow Up Visit  [name] [id number] 1940/12/25 76 y.o. 11/14/2016 10:24 AM [name], [name], Nancy Mola, MD   Principle Diagnosis: 76 year old woman with iron deficiency anemia diagnosed in April 2017. Her iron deficiency is related to angiodysplasia.  Prior Therapy:  She is status post IV iron infusion in the form of Feraheme obtained on 12/20/2015.  She is also status post ablation of gastric angiodysplasia done by Dr. Watt Climes on 06/15/2016.  Current therapy: Oral iron therapy  Interim History: [name] presents today for a follow-up visit. Since the last visit, she she reports feeling well without any changes in her health. She continues to be on oral iron therapy which she tolerates reasonably well. She does report occasional constipation that has been manageable. She reports her fatigue and energy is much improved. He did have an eye operation recently and her vision has improved dramatically.  She is no longer reporting any hematochezia, melena and does not report any hemoptysis or epistaxis. She does not report any abdominal pain or dyspepsia. He still attends activities of daily living without any decline.  She does not report any headaches, blurry vision, syncope or seizures. She is not report any fevers, chills or sweats. She does not report any cough, wheezing or hemoptysis. She does not report any chest pain, palpitation orthopnea. She does not report any nausea, vomiting or abdominal pain. She does not report any frequency urgency or hesitancy. Does not report any skeletal complaints. Remaining review of systems unremarkable.  Medications: I have reviewed the patient's current medications.  Current Outpatient Prescriptions  Medication Sig Dispense Refill  . albuterol (PROAIR HFA) 108 (90 BASE) MCG/ACT inhaler Inhale 2 puffs into the lungs every 4 (four) hours as needed for wheezing or shortness of breath. 1 Inhaler 3  . albuterol  (PROVENTIL) (2.5 MG/3ML) 0.083% nebulizer solution Take 3 mLs (2.5 mg total) by nebulization every 6 (six) hours as needed for wheezing or shortness of breath. 75 mL 12  . amLODipine (NORVASC) 10 MG tablet Take 10 mg by mouth daily.    . budesonide-formoterol (SYMBICORT) 160-4.5 MCG/ACT inhaler Inhale 2 puffs into the lungs 2 (two) times daily. (Patient taking differently: Inhale 2 puffs into the lungs 2 (two) times daily as needed (Wheezing, cough). ) 1 Inhaler 6  . diphenoxylate-atropine (LOMOTIL) 2.5-0.025 MG tablet Take 1 tablet by mouth 4 (four) times daily as needed for diarrhea or loose stools. 15 tablet 0  . docusate sodium (COLACE) 100 MG capsule Take 1 capsule (100 mg total) by mouth every 12 (twelve) hours. 30 capsule 0  . esomeprazole (NEXIUM) 40 MG capsule Take 40 mg by mouth daily.  0  . FeAsp-B12-FA-C-DSS-SuccAc-Zn (FERIVA 21/7 PO) Take by mouth. 2 tabs per day    . gabapentin (NEURONTIN) 300 MG capsule Take 600 mg by mouth 3 (three) times daily.     . Insulin Glargine (LANTUS SOLOSTAR [place]) Inject 58 Units into the skin at bedtime.     . insulin lispro (HUMALOG) 100 UNIT/ML SOCT Inject 22-28 Units into the skin 3 (three) times daily with meals. Sliding scale    . losartan (COZAAR) 100 MG tablet Take 100 mg by mouth daily.    . metoprolol (LOPRESSOR) 50 MG tablet Take 75 mg by mouth 2 (two) times daily.    . Multiple Vitamins-Minerals (PRESERVISION AREDS 2 PO) Take 1 tablet by mouth 2 (two) times daily.     . Pitavastatin Calcium (LIVALO) 1 MG TABS Take 1 mg by mouth  at bedtime.    . triamterene-hydrochlorothiazide (DYAZIDE) 37.5-25 MG per capsule Take 2 capsules by mouth daily after lunch.      No current facility-administered medications for this visit.      Allergies:  Allergies  Allergen Reactions  . Vasotec Shortness Of Breath and Other (See Comments)    wheezing  . Atorvastatin Other (See Comments)    Leg cramps   . Codeine Nausea And Vomiting  . Cymbalta [Duloxetine  Hcl] Itching  . Lansoprazole Itching, Rash and Other (See Comments)    redness  . Morphine And Related Itching  . Sulfate Itching  . Adhesive [Tape] Rash  . Scopolamine Rash    Past Medical History, Surgical history, Social history, and Family History were reviewed and updated.  Physical Exam: Blood pressure (!) 155/72, pulse 83, temperature 98.5 F (36.9 C), temperature source Oral, resp. rate 18, weight 159 lb 14.4 oz (72.5 kg), SpO2 96 %. ECOG: 1 General appearance: Well-appearing woman without active distress. Head: Normocephalic, without obvious abnormality no oral ulcers or thrush. Neck: no adenopathy Lymph nodes: Cervical, supraclavicular, and axillary nodes normal. Heart:regular rate and rhythm, S1, S2 normal, no murmur, click, rub or gallop Lung:chest clear, no wheezing, rales, normal symmetric air entry.  Abdomin: soft, non-tender, without masses or organomegaly no rebound or guarding. EXT:no erythema, induration, or nodules   Lab Results: Lab Results  Component Value Date   WBC 8.9 11/14/2016   HGB 12.6 11/14/2016   HCT 39.2 11/14/2016   MCV 87.1 11/14/2016   PLT 208 11/14/2016     Chemistry      Component Value Date/Time   NA 131 (L) 02/05/2016 2014   K 5.0 02/05/2016 2014   CL 97 (L) 02/05/2016 2014   CO2 26 02/05/2016 2014   BUN 24 (H) 02/05/2016 2014   CREATININE 0.85 02/05/2016 2014      Component Value Date/Time   CALCIUM 8.8 (L) 02/05/2016 2014   ALKPHOS 66 02/05/2016 2014   AST 29 02/05/2016 2014   ALT 18 02/05/2016 2014   BILITOT 0.9 02/05/2016 2014       Impression and Plan:  76 year old woman with the following issues:  1. Iron deficiency anemia presented CBC showed a hemoglobin of 9.5 and low MCV of 78 and elevated RDW of 16.8. Her white cell count and platelet count are normal with a normal white cell count and differential. This was diagnosed in April 2017.  She is status post intravenous iron done in April 2017 but developed  recurrent iron deficiency attributed to angiodysplasia of the stomach. She is status post APC done in October 2017.   She is currently on oral iron therapy with normalization of her hemoglobin. Her iron studies are currently pending but certainly appears to be responding to oral iron therapy.  I recommended continuing daily iron and we'll repeat iron studies in 6 months. If her iron stores continue to be adequate, we can recommend his continuation of iron therapy at that time.  2. Angiodysplasia of the stomach: No active bleeding noted as of late.  3. Follow-up: Will be in 6 months to repeat her iron studies.  Zola Button, MD 3/20/201810:24 AM

## 2016-11-24 ENCOUNTER — Other Ambulatory Visit: Payer: Self-pay | Admitting: Family Medicine

## 2016-11-24 DIAGNOSIS — N63 Unspecified lump in unspecified breast: Secondary | ICD-10-CM

## 2016-11-29 ENCOUNTER — Other Ambulatory Visit: Payer: Medicare Other

## 2016-12-01 ENCOUNTER — Ambulatory Visit
Admission: RE | Admit: 2016-12-01 | Discharge: 2016-12-01 | Disposition: A | Discharge status: Home / Self Care | Payer: Medicare Other | Source: Ambulatory Visit | Attending: Family Medicine | Admitting: Family Medicine

## 2016-12-01 DIAGNOSIS — N63 Unspecified lump in unspecified breast: Secondary | ICD-10-CM

## 2016-12-25 DIAGNOSIS — E7800 Pure hypercholesterolemia, unspecified: Secondary | ICD-10-CM | POA: Insufficient documentation

## 2016-12-25 DIAGNOSIS — E119 Type 2 diabetes mellitus without complications: Secondary | ICD-10-CM | POA: Insufficient documentation

## 2017-01-03 ENCOUNTER — Telehealth: Payer: Self-pay

## 2017-01-03 NOTE — Telephone Encounter (Signed)
Received fax from [facility]. Dr. Watt Climes saw pt for stomach upset/nausea, hx of GI bleed, iron def anemia and plans for pt to f/u w/ neuro to make shure this is not  vestibular problem. She has appt scheduled tomorrow for dizziness.   Sent to med records for scanning, copy to Dr. Jaynee Eagles for review.

## 2017-01-04 ENCOUNTER — Ambulatory Visit (INDEPENDENT_AMBULATORY_CARE_PROVIDER_SITE_OTHER): Payer: Medicare Other | Admitting: Neurology

## 2017-01-04 ENCOUNTER — Encounter: Payer: Self-pay | Admitting: Neurology

## 2017-01-04 VITALS — BP 162/70 | HR 70 | Ht 64.5 in | Wt 158.8 lb

## 2017-01-04 DIAGNOSIS — R42 Dizziness and giddiness: Secondary | ICD-10-CM | POA: Diagnosis not present

## 2017-01-04 MED ORDER — ONDANSETRON 4 MG PO TBDP: 4.0000 mg | ORAL_TABLET | Freq: Three times a day (TID) | ORAL | 3 refills | Status: DC | PRN

## 2017-01-04 MED ORDER — FLUOXETINE HCL 10 MG PO CAPS: 10.0000 mg | ORAL_CAPSULE | Freq: Every day | ORAL | 6 refills | Status: DC

## 2017-01-04 NOTE — Patient Instructions (Addendum)
Remember to drink plenty of fluid, eat healthy meals and do not skip any meals. Try to eat protein with a every meal and eat a healthy snack such as fruit or nuts in between meals. Try to keep a regular sleep-wake schedule and try to exercise daily, particularly in the form of walking, 20-30 minutes a day, if you can.   Our phone number is [phone]. We also have an after hours call service for urgent matters and there is a physician on-call for urgent questions. For any emergencies you know to call 911 or go to the nearest emergency room  Ondansetron oral dissolving tablet What is this medicine? ONDANSETRON (on DAN se tron) is used to treat nausea and vomiting caused by chemotherapy. It is also used to prevent or treat nausea and vomiting after surgery. This medicine may be used for other purposes; ask your health care provider or pharmacist if you have questions. COMMON BRAND NAME(S): Zofran ODT What should I tell my health care provider before I take this medicine? They need to know if you have any of these conditions: -heart disease -history of irregular heartbeat -liver disease -low levels of magnesium or potassium in the blood -an unusual or allergic reaction to ondansetron, granisetron, other medicines, foods, dyes, or preservatives -pregnant or trying to get pregnant -breast-feeding How should I use this medicine? These tablets are made to dissolve in the mouth. Do not try to push the tablet through the foil backing. With dry hands, peel away the foil backing and gently remove the tablet. Place the tablet in the mouth and allow it to dissolve, then swallow. While you may take these tablets with water, it is not necessary to do so. Talk to your pediatrician regarding the use of this medicine in children. Special care may be needed. Overdosage: If you think you have taken too much of this medicine contact a poison control center or emergency room at once. NOTE: This medicine is only  for you. Do not share this medicine with others. What if I miss a dose? If you miss a dose, take it as soon as you can. If it is almost time for your next dose, take only that dose. Do not take double or extra doses. What may interact with this medicine? Do not take this medicine with any of the following medications: -apomorphine -certain medicines for fungal infections like fluconazole, itraconazole, ketoconazole, posaconazole, voriconazole -cisapride -dofetilide -dronedarone -pimozide -thioridazine -ziprasidone This medicine may also interact with the following medications: -carbamazepine -certain medicines for depression, anxiety, or psychotic disturbances -fentanyl -linezolid -MAOIs like Carbex, Eldepryl, Marplan, Nardil, and Parnate -methylene blue (injected into a vein) -other medicines that prolong the QT interval (cause an abnormal heart rhythm) -phenytoin -rifampicin -tramadol This list may not describe all possible interactions. Give your health care provider a list of all the medicines, herbs, non-prescription drugs, or dietary supplements you use. Also tell them if you smoke, drink alcohol, or use illegal drugs. Some items may interact with your medicine. What should I watch for while using this medicine? Check with your doctor or health care professional as soon as you can if you have any sign of an allergic reaction. What side effects may I notice from receiving this medicine? Side effects that you should report to your doctor or health care professional as soon as possible: -allergic reactions like skin rash, itching or hives, swelling of the face, lips, or tongue -breathing problems -confusion -dizziness -fast or irregular heartbeat -feeling faint or lightheaded,  falls -fever and chills -loss of balance or coordination -seizures -sweating -swelling of the hands and feet -tightness in the chest -tremors -unusually weak or tired Side effects that usually do  not require medical attention (report to your doctor or health care professional if they continue or are bothersome): -constipation or diarrhea -headache This list may not describe all possible side effects. Call your doctor for medical advice about side effects. You may report side effects to FDA at 1-800-FDA-1088. Where should I keep my medicine? Keep out of the reach of children. Store between 2 and 30 degrees C (36 and 86 degrees F). Throw away any unused medicine after the expiration date. NOTE: This sheet is a summary. It may not cover all possible information. If you have questions about this medicine, talk to your doctor, pharmacist, or health care provider.  2018 Elsevier/Gold Standard (2013-05-21 16:21:52)  Fluoxetine capsules or tablets (Depression/Mood Disorders) What is this medicine? FLUOXETINE (floo OX e teen) belongs to a class of drugs known as selective serotonin reuptake inhibitors (SSRIs). It helps to treat mood problems such as depression, obsessive compulsive disorder, and panic attacks. It can also treat certain eating disorders. Also used in migraine management.  This medicine may be used for other purposes; ask your health care provider or pharmacist if you have questions. COMMON BRAND NAME(S): Prozac What should I tell my health care provider before I take this medicine? They need to know if you have any of these conditions: -bipolar disorder or a family history of bipolar disorder -bleeding disorders -glaucoma -heart disease -liver disease -low levels of sodium in the blood -seizures -suicidal thoughts, plans, or attempt; a previous suicide attempt by you or a family member -take MAOIs like Carbex, Eldepryl, Marplan, Nardil, and Parnate -take medicines that treat or prevent blood clots -thyroid disease -an unusual or allergic reaction to fluoxetine, other medicines, foods, dyes, or preservatives -pregnant or trying to get pregnant -breast-feeding How should  I use this medicine? Take this medicine by mouth with a glass of water. Follow the directions on the prescription label. You can take this medicine with or without food. Take your medicine at regular intervals. Do not take it more often than directed. Do not stop taking this medicine suddenly except upon the advice of your doctor. Stopping this medicine too quickly may cause serious side effects or your condition may worsen. A special MedGuide will be given to you by the pharmacist with each prescription and refill. Be sure to read this information carefully each time. Talk to your pediatrician regarding the use of this medicine in children. While this drug may be prescribed for children as young as 7 years for selected conditions, precautions do apply. Overdosage: If you think you have taken too much of this medicine contact a poison control center or emergency room at once. NOTE: This medicine is only for you. Do not share this medicine with others. What if I miss a dose? If you miss a dose, skip the missed dose and go back to your regular dosing schedule. Do not take double or extra doses. What may interact with this medicine? Do not take this medicine with any of the following medications: -other medicines containing fluoxetine, like Sarafem or Symbyax -cisapride -linezolid -MAOIs like Carbex, Eldepryl, Marplan, Nardil, and Parnate -methylene blue (injected into a vein) -pimozide -thioridazine This medicine may also interact with the following medications: -alcohol -amphetamines -aspirin and aspirin-like medicines -carbamazepine -certain medicines for depression, anxiety, or psychotic disturbances -certain medicines for  migraine headaches like almotriptan, eletriptan, frovatriptan, naratriptan, rizatriptan, sumatriptan, zolmitriptan -digoxin -diuretics -fentanyl -flecainide -furazolidone -isoniazid -lithium -medicines for sleep -medicines that treat or prevent blood clots like  warfarin, enoxaparin, and dalteparin -NSAIDs, medicines for pain and inflammation, like ibuprofen or naproxen -phenytoin -procarbazine -propafenone -rasagiline -ritonavir -supplements like St. [name]'s wort, kava kava, valerian -tramadol -tryptophan -vinblastine This list may not describe all possible interactions. Give your health care provider a list of all the medicines, herbs, non-prescription drugs, or dietary supplements you use. Also tell them if you smoke, drink alcohol, or use illegal drugs. Some items may interact with your medicine. What should I watch for while using this medicine? Tell your doctor if your symptoms do not get better or if they get worse. Visit your doctor or health care professional for regular checks on your progress. Because it may take several weeks to see the full effects of this medicine, it is important to continue your treatment as prescribed by your doctor. Patients and their families should watch out for new or worsening thoughts of suicide or depression. Also watch out for sudden changes in feelings such as feeling anxious, agitated, panicky, irritable, hostile, aggressive, impulsive, severely restless, overly excited and hyperactive, or not being able to sleep. If this happens, especially at the beginning of treatment or after a change in dose, call your health care professional. Dennis Bast may get drowsy or dizzy. Do not drive, use machinery, or do anything that needs mental alertness until you know how this medicine affects you. Do not stand or sit up quickly, especially if you are an older patient. This reduces the risk of dizzy or fainting spells. Alcohol may interfere with the effect of this medicine. Avoid alcoholic drinks. Your mouth may get dry. Chewing sugarless gum or sucking hard candy, and drinking plenty of water may help. Contact your doctor if the problem does not go away or is severe. This medicine may affect blood sugar levels. If you have diabetes,  check with your doctor or health care professional before you change your diet or the dose of your diabetic medicine. What side effects may I notice from receiving this medicine? Side effects that you should report to your doctor or health care professional as soon as possible: -allergic reactions like skin rash, itching or hives, swelling of the face, lips, or tongue -anxious -black, tarry stools -breathing problems -changes in vision -confusion -elevated mood, decreased need for sleep, racing thoughts, impulsive behavior -eye pain -fast, irregular heartbeat -feeling faint or lightheaded, falls -feeling agitated, angry, or irritable -hallucination, loss of contact with reality -loss of balance or coordination -loss of memory -painful or prolonged erections -restlessness, pacing, inability to keep still -seizures -stiff muscles -suicidal thoughts or other mood changes -trouble sleeping -unusual bleeding or bruising -unusually weak or tired -vomiting Side effects that usually do not require medical attention (report to your doctor or health care professional if they continue or are bothersome): -change in appetite or weight -change in sex drive or performance -diarrhea -dry mouth -headache -increased sweating -nausea -tremors This list may not describe all possible side effects. Call your doctor for medical advice about side effects. You may report side effects to FDA at 1-800-FDA-1088. Where should I keep my medicine? Keep out of the reach of children. Store at room temperature between 15 and 30 degrees C (59 and 86 degrees F). Throw away any unused medicine after the expiration date. NOTE: This sheet is a summary. It may not cover all possible information. If  you have questions about this medicine, talk to your doctor, pharmacist, or health care provider.  2018 Elsevier/Gold Standard (2016-01-15 15:55:27)

## 2017-01-04 NOTE — Progress Notes (Signed)
[facility]   Provider:  Dr Jaynee Eagles Referring Provider: Minna Merritts MD Primary Care Physician:  [name], MD  CC:  Dizziness and nausea  Interval history 01/04/2017: Patient returns today with persistent dizziness. Previous neurologic workup was negative. Suspect anxiety/stress. Also a medication effect could be the cause. The dizziness has significantly improved. But still dizzy and lightheaded all the time even when just sitting.  Recommend following up with Dr. Drema Dallas to investigate if medication is the cause and also discuss restarting anxiety medication. No falls. Also dizzy on standing. Recommend standing slowly. When she gets out of the bed in the morning it is worse. Can be room spinning or just a general feeling of dizziness. The dizziness is making her nauseated. Discussed she needs to follow up with Dr. Ernesto Rutherford. She coul dnot tolerate vestibular therapy when I orderd it and I recommend going back. She has "nerve problems" and she gets agitated. Also due to stress and anxiety. Her son is a drug addict, husband had a heart attack and totalled a car, Lots of anxiety. She is dizzy all the time even just sitting she feels dizzy. She has not been to therapy to discuss.    HPI:  [name] is a 76 y.o. female here as a referral from Dr. Ernesto Rutherford  for dizziness and nausea. PMHx DM. HTN, asthma, gerd. Per notes, she has reported epigastric abdominal pain and nausea for month, last in the ED 02/05/2016. She has seen her pcp and GI specialist and Dr. Ernesto Rutherford ENT. Nexium and zofran without relief. She was in the ED 12/22/2015 for nausea, vomiting, diarrhea for 1 week. She is here today, and she sometimes has burning in the epigastrium but not today, at times her abdomen hurts, she says her gallbladder has not been checked. She doesn't know what is going on. Dr. Ernesto Rutherford checked her ears for infections and hearing and no issues. She has lost 12 pounds. Her  dizziness is like she has a cap on top of her head and someone is tightening the screws on it. It all started after an iron infusion treatment, she had to use a wheelchair to go home and she has been sick since then in the ED.The headaches have been on and off the whole time. Headaches daily. If she moves her head up and down or to shake it she gets dizzy. The dizziness is room spinning. She is falling, having a difficult time, she has to hold on to a wall, she feels like her head is spinning. She isn't able to walk. Multiple falls. Worse sitting up, it helps to bend over. No history of migraines. No history of significant headaches or migraines. The light bothers her, not sound sensitivity.  She is under a lot of stress, husband recent heart attack and son drug addict.    Reviewed notes, labs and imaging from outside physicians, which showed:  CT of the head 07/2008(personally reviewed images and agree with the following): Findings: The examination is limited because of motion artifact. The ventricles are in the midline without mass effect or shift. They are normal in size and configuration. No extra-axial fluid collections are seen. There is periventricular white matter disease which is likely microvascular ischemic change. No definite acute intracranial findings and no mass lesions. The brainstem and cerebellum are grossly normal. The bony calvarium is intact and the visualized paranasal sinuses and mastoid air cells are clear.  IMPRESSION: Examination is limited but no definite acute intracranial findings.  CMP  with elevated glucose 182, CBC mild anemia 11.9  Gastorc emptying study and CT abdomen and pelvis have been normal.    Review of Systems: Patient complains of symptoms per HPI as well as the following symptoms: nausea, vomiting. No fevers. She has chills, no dark stools. Pertinent negatives per HPI. All others negative.   Social History   Social History  . Marital  status: Married    Spouse name: Charlotte Crumb  . Number of children: 2  . Years of education: 16   Occupational History  .      retired Pharmacist, hospital, IT sales professional   Social History Main Topics  . Smoking status: Never Smoker  . Smokeless tobacco: Never Used  . Alcohol use No  . Drug use: No  . Sexual activity: No   Other Topics Concern  . Not on file   Social History Narrative   Lives with husband   Caffeine use- none    Family History  Problem Relation Age of Onset  . Lung cancer Mother   . Heart attack Father   . Colon cancer Neg Hx   . Stroke Neg Hx   . Migraines Neg Hx     Past Medical History:  Diagnosis Date  . Anemia   . Arthritis   . Asthma   . Diabetes mellitus    type 2  . GERD (gastroesophageal reflux disease)   . Heart murmur   . Hypertension   . PONV (postoperative nausea and vomiting)     Past Surgical History:  Procedure Laterality Date  . APPENDECTOMY  1952  . ESOPHAGOGASTRODUODENOSCOPY (EGD) WITH PROPOFOL N/A 06/15/2016   Procedure: ESOPHAGOGASTRODUODENOSCOPY (EGD) WITH PROPOFOL;  Surgeon: Clarene Essex, MD;  Location: [facility];  Service: Endoscopy;  Laterality: N/A;  . HAND SURGERY Left 2010   operated on index and ring finger  . INCONTINENCE SURGERY    . JOINT REPLACEMENT Right    knee  . KNEE ARTHROSCOPY  2003 2005   right knee  . mass fallopian tube    . SPINAL CORD STIMULATOR BATTERY EXCHANGE N/A 09/09/2015   Procedure: SPINAL CORD STIMULATOR BATTERY EXCHANGE;  Surgeon: Melina Schools, MD;  Location: [facility];  Service: Orthopedics;  Laterality: N/A;  . VAGINAL HYSTERECTOMY  1977   1 ovary removed    Current Outpatient Prescriptions  Medication Sig Dispense Refill  . albuterol (PROAIR HFA) 108 (90 BASE) MCG/ACT inhaler Inhale 2 puffs into the lungs every 4 (four) hours as needed for wheezing or shortness of breath. 1 Inhaler 3  . albuterol (PROVENTIL) (2.5 MG/3ML) 0.083% nebulizer solution Take 3 mLs (2.5 mg total) by nebulization every 6  (six) hours as needed for wheezing or shortness of breath. 75 mL 12  . amLODipine (NORVASC) 10 MG tablet Take 10 mg by mouth daily.    . budesonide-formoterol (SYMBICORT) 160-4.5 MCG/ACT inhaler Inhale 2 puffs into the lungs 2 (two) times daily. (Patient taking differently: Inhale 2 puffs into the lungs 2 (two) times daily as needed (Wheezing, cough). ) 1 Inhaler 6  . esomeprazole (NEXIUM) 40 MG capsule Take 40 mg by mouth daily.  0  . FeAsp-B12-FA-C-DSS-SuccAc-Zn (FERIVA 21/7 PO) Take by mouth. 2 tabs per day    . gabapentin (NEURONTIN) 300 MG capsule Take 600 mg by mouth 3 (three) times daily.     . Insulin Glargine (LANTUS SOLOSTAR [place]) Inject 58 Units into the skin at bedtime.     . insulin lispro (HUMALOG) 100 UNIT/ML SOCT Inject 22-28 Units into the skin  3 (three) times daily with meals. Sliding scale    . losartan (COZAAR) 100 MG tablet Take 100 mg by mouth daily.    . metoprolol (LOPRESSOR) 50 MG tablet Take 75 mg by mouth 2 (two) times daily.    . Multiple Vitamins-Minerals (PRESERVISION AREDS 2 PO) Take 1 tablet by mouth 2 (two) times daily.     . Pitavastatin Calcium (LIVALO) 1 MG TABS Take 1 mg by mouth at bedtime.    . triamterene-hydrochlorothiazide (DYAZIDE) 37.5-25 MG per capsule Take 2 capsules by mouth daily after lunch.     Marland Kitchen FLUoxetine (PROZAC) 10 MG capsule Take 1 capsule (10 mg total) by mouth daily. 30 capsule 6  . ondansetron (ZOFRAN ODT) 4 MG disintegrating tablet Take 1 tablet (4 mg total) by mouth every 8 (eight) hours as needed for nausea or vomiting. 30 tablet 3   No current facility-administered medications for this visit.     Allergies as of 01/04/2017 - Review Complete 01/04/2017  Allergen Reaction Noted  . Vasotec Shortness Of Breath and Other (See Comments) 03/21/2011  . Atorvastatin Other (See Comments) 12/22/2015  . Codeine Nausea And Vomiting 03/21/2011  . Cymbalta [duloxetine hcl] Itching 06/14/2016  . Lansoprazole Itching, Rash, and Other (See Comments)  03/21/2011  . Morphine and related Itching 03/21/2011  . Sulfate Itching 03/21/2011  . Adhesive [tape] Rash 12/22/2015  . Scopolamine Rash 12/22/2015    Vitals: BP (!) 162/70 (Patient Position: Standing)   Pulse 70   Ht 5' 4.5" (1.638 m)   Wt 158 lb 12.8 oz (72 kg)   BMI 26.84 kg/m  Last Weight:  Wt Readings from Last 1 Encounters:  01/04/17 158 lb 12.8 oz (72 kg)   Last Height:   Ht Readings from Last 1 Encounters:  01/04/17 5' 4.5" (1.638 m)       Physical exam: Exam: Gen: Patient in distress, holding her stomach              CV: RRR, no MRG. No Carotid Bruits. No peripheral edema, warm, nontender Eyes: Conjunctivae clear without exudates or hemorrhage  Neuro: Detailed Neurologic Exam  Speech:    Speech is normal; fluent and spontaneous with normal comprehension.  Cognition:    The patient is oriented to person, place, and time;     recent and remote memory intact;     language fluent;     normal attention, concentration,     fund of knowledge Cranial Nerves:    The pupils are equal, round, and reactive to light. Attempted fundoscopic exam, could not visualize due to patient non cooperative. . Visual fields are full to finger confrontation. Extraocular movements are intact. Trigeminal sensation is intact and the muscles of mastication are normal. The face is symmetric. The palate elevates in the midline. Hearing intact. Voice is normal. Shoulder shrug is normal. The tongue has normal motion without fasciculations.   Coordination:    Normal finger to nose and heel to shin.   Gait:   normal gait, good stride, good balance  Motor Observation:    No asymmetry, no atrophy, and no involuntary movements noted. Tone:    Normal muscle tone.    Posture:    Posture is normal. normal erect    Strength: Poor effort, but appears all are symmetric and intact.     Sensation: intact to LT     Reflex Exam:  DTR's: Absent As otherwise deep tendon reflexes in the  upper and lower extremities are brisk for age and  medical conditions bilaterally.   Toes:    The toes are downgoing bilaterally.   Clonus:    Clonus is absent.       Assessment/Plan:  a 76 y.o. female here for continuous dizziness. Neurologic workup has been negative. I think this is more anxiety and stress or medication effect but can;t rule out vestibular migraine.  - I think his large component of stress and anxiety.  - She could not tolerate vestibular therapylast year, I recommend going back but she declines - She needs follow-up with Dr. Ernesto Rutherford. - Will give her Zofran as needed. - Recommended therapy to deal with her life stressors and anxiety - she can follow-up with primary care and ear nose and throat. - Also recommend following up with primary care to see if a medication effect to be causing her persistent dizziness - Discuss fall risk and safety precautions, recommend physical therapy - Will start Prozac which may be good for her anxiety and is also used in migraine management. Follow up with Dr. Drema Dallas.   Cc: Dr. Drema Dallas and Dr. Demetrius Revel, MD  Sarina Ill, MD  [facility] [address] [place], [place] [address]  Phone [phone] Fax [phone]  A total of 25 minutes was spent face-to-face with this patient. Over half this time was spent on counseling patient on the chronic dizziness diagnosis and different diagnostic and therapeutic options available.

## 2017-02-02 ENCOUNTER — Telehealth: Payer: Self-pay

## 2017-02-02 NOTE — Telephone Encounter (Signed)
Received OV notes from 01/17/17 visit w/ Dr. Ernesto Rutherford, "Diagnosis: Cerumen impaction left and marked improvement. Plan: See her again in 1 yr." Sent to med records for scanning, copy to Dr. Jaynee Eagles for review.

## 2017-04-20 ENCOUNTER — Ambulatory Visit
Admission: RE | Admit: 2017-04-20 | Discharge: 2017-04-20 | Disposition: A | Discharge status: Home / Self Care | Payer: Medicare Other | Source: Ambulatory Visit | Attending: Gastroenterology | Admitting: Gastroenterology

## 2017-04-20 ENCOUNTER — Other Ambulatory Visit: Payer: Self-pay | Admitting: Gastroenterology

## 2017-04-20 DIAGNOSIS — K5900 Constipation, unspecified: Secondary | ICD-10-CM

## 2017-05-01 ENCOUNTER — Other Ambulatory Visit: Payer: Self-pay | Admitting: Family Medicine

## 2017-05-01 DIAGNOSIS — N63 Unspecified lump in unspecified breast: Secondary | ICD-10-CM

## 2017-05-11 ENCOUNTER — Telehealth: Payer: Self-pay | Admitting: Oncology

## 2017-05-11 NOTE — Telephone Encounter (Signed)
Patient cancelled lab appointment said that she had labs done at Dr Ferrel Logan office

## 2017-05-15 ENCOUNTER — Other Ambulatory Visit: Payer: Medicare Other

## 2017-05-15 ENCOUNTER — Ambulatory Visit: Payer: Medicare Other | Admitting: Oncology

## 2017-06-05 ENCOUNTER — Ambulatory Visit
Admission: RE | Admit: 2017-06-05 | Discharge: 2017-06-05 | Disposition: A | Discharge status: Home / Self Care | Payer: Medicare Other | Source: Ambulatory Visit | Attending: Family Medicine | Admitting: Family Medicine

## 2017-06-05 DIAGNOSIS — N63 Unspecified lump in unspecified breast: Secondary | ICD-10-CM

## 2017-08-27 ENCOUNTER — Ambulatory Visit (HOSPITAL_COMMUNITY)
Admission: RE | Admit: 2017-08-27 | Discharge: 2017-08-27 | Disposition: A | Discharge status: Home / Self Care | Payer: Medicare Other | Source: Ambulatory Visit | Attending: Internal Medicine | Admitting: Internal Medicine

## 2017-08-27 ENCOUNTER — Encounter (HOSPITAL_COMMUNITY): Payer: Self-pay

## 2017-08-27 ENCOUNTER — Other Ambulatory Visit (HOSPITAL_COMMUNITY): Payer: Self-pay | Admitting: Orthopedic Surgery

## 2017-08-27 DIAGNOSIS — R52 Pain, unspecified: Secondary | ICD-10-CM | POA: Diagnosis not present

## 2017-08-27 DIAGNOSIS — M79662 Pain in left lower leg: Secondary | ICD-10-CM | POA: Diagnosis not present

## 2017-08-27 DIAGNOSIS — R936 Abnormal findings on diagnostic imaging of limbs: Secondary | ICD-10-CM | POA: Diagnosis not present

## 2017-08-27 DIAGNOSIS — R609 Edema, unspecified: Secondary | ICD-10-CM

## 2017-08-27 DIAGNOSIS — M7989 Other specified soft tissue disorders: Secondary | ICD-10-CM | POA: Insufficient documentation

## 2017-08-27 NOTE — Progress Notes (Unsigned)
Today's left lower extremity venous duplex is negative for DVT. There is a Baker's cyst. Preliminary results given to [place] at 10:55 AM.

## 2017-09-06 DIAGNOSIS — M1990 Unspecified osteoarthritis, unspecified site: Secondary | ICD-10-CM | POA: Insufficient documentation

## 2017-09-06 DIAGNOSIS — M25562 Pain in left knee: Secondary | ICD-10-CM | POA: Insufficient documentation

## 2017-09-06 DIAGNOSIS — I10 Essential (primary) hypertension: Secondary | ICD-10-CM | POA: Insufficient documentation

## 2017-10-26 ENCOUNTER — Encounter (HOSPITAL_COMMUNITY): Payer: Self-pay

## 2017-10-26 NOTE — H&P (Signed)
TOTAL KNEE ADMISSION H&P  Patient is being admitted for left total knee arthroplasty.  Subjective:  Chief Complaint:  Left knee primary OA / pain  HPI: [name], 77 y.o. female, has a history of pain and functional disability in the left knee due to arthritis and has failed non-surgical conservative treatments for greater than 12 weeks to include NSAID's and/or analgesics, corticosteriod injections, viscosupplementation injections and activity modification.  Onset of symptoms was gradual, starting 1+ years ago with gradually worsening course since that time. The patient noted prior procedures on the knee to include  arthroplasty on the right knee 13 yrs ago.  Patient currently rates pain in the left knee(s) at 10 out of 10 with activity. Patient has night pain, worsening of pain with activity and weight bearing, pain that interferes with activities of daily living, pain with passive range of motion, crepitus and joint swelling.  Patient has evidence of periarticular osteophytes and joint space narrowing by imaging studies.  There is no active infection.  Risks, benefits and expectations were discussed with the patient.  Risks including but not limited to the risk of anesthesia, blood clots, nerve damage, blood vessel damage, failure of the prosthesis, infection and up to and including death.  Patient understand the risks, benefits and expectations and wishes to proceed with surgery.   PCP: Leighton Ruff, MD  D/C Plans:       Home   Post-op Meds:       No Rx given  Tranexamic Acid:      To be given - IV   Decadron:      Is to be given  FYI:     Xarelto (unable to tolerate ASA and NSAIDs, issues with ulcers)  No celebrex  Norco  DME:   Rx given for - RW   PT:   OPPT Rx given   Patient Active Problem List   Diagnosis Date Noted  . Iron deficiency anemia 12/15/2015  . Acute GI bleeding 05/13/2015  . Acute blood loss anemia 05/13/2015  . Normocytic anemia 05/13/2015  .  Allergic rhinitis 04/06/2015  . Asthma, chronic 09/08/2013  . Hypoxia 09/08/2013  . HTN (hypertension) 09/08/2013  . Depression 09/08/2013  . DM type 2 with diabetic peripheral neuropathy ([place]) 09/08/2013  . Flu-like symptoms 09/07/2013   Past Medical History:  Diagnosis Date  . Anemia   . Arthritis   . Asthma   . Diabetes mellitus    type 2  . GERD (gastroesophageal reflux disease)   . Heart murmur   . Hypertension   . PONV (postoperative nausea and vomiting)     Past Surgical History:  Procedure Laterality Date  . APPENDECTOMY  1952  . ESOPHAGOGASTRODUODENOSCOPY (EGD) WITH PROPOFOL N/A 06/15/2016   Procedure: ESOPHAGOGASTRODUODENOSCOPY (EGD) WITH PROPOFOL;  Surgeon: Clarene Essex, MD;  Location: [facility];  Service: Endoscopy;  Laterality: N/A;  . HAND SURGERY Left 2010   operated on index and ring finger  . INCONTINENCE SURGERY    . JOINT REPLACEMENT Right    knee  . KNEE ARTHROSCOPY  2003 2005   right knee  . mass fallopian tube    . SPINAL CORD STIMULATOR BATTERY EXCHANGE N/A 09/09/2015   Procedure: SPINAL CORD STIMULATOR BATTERY EXCHANGE;  Surgeon: Melina Schools, MD;  Location: [place];  Service: Orthopedics;  Laterality: N/A;  . VAGINAL HYSTERECTOMY  1977   1 ovary removed    No current facility-administered medications for this encounter.    Current Outpatient Medications  Medication  Sig Dispense Refill Last Dose  . acetaminophen (TYLENOL) 650 MG CR tablet Take 1,300 mg by mouth every 8 (eight) hours as needed for pain.     Marland Kitchen albuterol (PROAIR HFA) 108 (90 BASE) MCG/ACT inhaler Inhale 2 puffs into the lungs every 4 (four) hours as needed for wheezing or shortness of breath. 1 Inhaler 3 Taking  . ALPRAZolam (XANAX) 0.25 MG tablet Take 0.25 mg by mouth daily as needed for anxiety.     Marland Kitchen amitriptyline (ELAVIL) 50 MG tablet Take 50 mg by mouth at bedtime.     Marland Kitchen amLODipine (NORVASC) 10 MG tablet Take 10 mg by mouth daily.   Taking  . esomeprazole (NEXIUM) 40 MG capsule  Take 40 mg by mouth daily.  0 Taking  . gabapentin (NEURONTIN) 300 MG capsule Take 600 mg by mouth 3 (three) times daily.    Taking  . Insulin Glargine (LANTUS SOLOSTAR [place]) Inject 55 Units into the skin at bedtime.    Taking  . insulin lispro (HUMALOG) 100 UNIT/ML SOCT Inject 22-28 Units into the skin 3 (three) times daily with meals. Sliding scale 18 units at breakfast, 25 units at lunch and 28 units at dinner   Taking  . losartan (COZAAR) 100 MG tablet Take 100 mg by mouth daily.   Taking  . metoprolol (LOPRESSOR) 50 MG tablet Take 75 mg by mouth 2 (two) times daily. Takes 1.5 tablet twice daily   Taking  . Multiple Vitamins-Minerals (PRESERVISION AREDS 2 PO) Take 1 tablet by mouth 2 (two) times daily.    Taking  . Pitavastatin Calcium (LIVALO) 1 MG TABS Take 1 mg by mouth at bedtime.   Taking  . triamterene-hydrochlorothiazide (DYAZIDE) 37.5-25 MG per capsule Take 2 capsules by mouth daily after lunch.    Taking  . venlafaxine XR (EFFEXOR-XR) 37.5 MG 24 hr capsule Take 37.5 mg by mouth daily with breakfast.     . albuterol (PROVENTIL) (2.5 MG/3ML) 0.083% nebulizer solution Take 3 mLs (2.5 mg total) by nebulization every 6 (six) hours as needed for wheezing or shortness of breath. (Patient not taking: Reported on 10/18/2017) 75 mL 12 Not Taking at Unknown time  . budesonide-formoterol (SYMBICORT) 160-4.5 MCG/ACT inhaler Inhale 2 puffs into the lungs 2 (two) times daily. (Patient not taking: Reported on 10/18/2017) 1 Inhaler 6 Not Taking at Unknown time  . FLUoxetine (PROZAC) 10 MG capsule Take 1 capsule (10 mg total) by mouth daily. (Patient not taking: Reported on 10/18/2017) 30 capsule 6 Not Taking at Unknown time  . ondansetron (ZOFRAN ODT) 4 MG disintegrating tablet Take 1 tablet (4 mg total) by mouth every 8 (eight) hours as needed for nausea or vomiting. (Patient not taking: Reported on 10/18/2017) 30 tablet 3 Not Taking at Unknown time   Allergies  Allergen Reactions  . Vasotec Shortness Of  Breath and Other (See Comments)    wheezing  . Atorvastatin Other (See Comments)    Leg cramps   . Codeine Nausea And Vomiting  . Cymbalta [Duloxetine Hcl] Itching  . Lansoprazole Itching, Swelling, Rash and Other (See Comments)    Redness, swelling of mouth   . Morphine And Related Itching  . Sulfate Itching  . Adhesive [Tape] Rash  . Scopolamine Rash    Social History   Tobacco Use  . Smoking status: Never Smoker  . Smokeless tobacco: Never Used  Substance Use Topics  . Alcohol use: No    Family History  Problem Relation Age of Onset  . Lung cancer  Mother   . Heart attack Father   . Colon cancer Neg Hx   . Stroke Neg Hx   . Migraines Neg Hx      Review of Systems  Constitutional: Negative.   HENT: Negative.   Eyes: Negative.   Respiratory: Negative.   Cardiovascular: Negative.   Gastrointestinal: Positive for heartburn.  Genitourinary: Positive for frequency.  Musculoskeletal: Positive for joint pain.  Skin: Negative.   Neurological: Negative.   Endo/Heme/Allergies: Positive for environmental allergies.  Psychiatric/Behavioral: Positive for depression. The patient is nervous/anxious.     Objective:  Physical Exam  Constitutional: She is oriented to person, place, and time. She appears well-developed.  HENT:  Head: Normocephalic.  Eyes: Pupils are equal, round, and reactive to light.  Neck: Neck supple. No JVD present. No tracheal deviation present. No thyromegaly present.  Cardiovascular: Normal rate, regular rhythm and intact distal pulses.  Respiratory: Effort normal and breath sounds normal. No respiratory distress. She has no wheezes.  GI: Soft. There is no tenderness. There is no guarding.  Musculoskeletal:       Left knee: She exhibits decreased range of motion, swelling and bony tenderness. She exhibits no ecchymosis, no deformity, no laceration and no erythema. Tenderness found.  Lymphadenopathy:    She has no cervical adenopathy.  Neurological:  She is alert and oriented to person, place, and time. A sensory deficit (bilateral DM neuropathy) is present.  Skin: Skin is warm and dry.  Psychiatric: She has a normal mood and affect.     Labs:  Estimated body mass index is 26.84 kg/m as calculated from the following:   Height as of 01/04/17: 5' 4.5" (1.638 m).   Weight as of 01/04/17: 72 kg (158 lb 12.8 oz).   Imaging Review Plain radiographs demonstrate severe degenerative joint disease of the left knee. The bone quality appears to be good for age and reported activity level.  Assessment/Plan:  End stage arthritis, left knee   The patient history, physical examination, clinical judgment of the provider and imaging studies are consistent with end stage degenerative joint disease of the left knee(s) and total knee arthroplasty is deemed medically necessary. The treatment options including medical management, injection therapy arthroscopy and arthroplasty were discussed at length. The risks and benefits of total knee arthroplasty were presented and reviewed. The risks due to aseptic loosening, infection, stiffness, patella tracking problems, thromboembolic complications and other imponderables were discussed. The patient acknowledged the explanation, agreed to proceed with the plan and consent was signed. Patient is being admitted for inpatient treatment for surgery, pain control, PT, OT, prophylactic antibiotics, VTE prophylaxis, progressive ambulation and ADL's and discharge planning. The patient is planning to be discharged home.     [place] Erandy Mceachern   PA-C  10/26/2017, 10:23 AM

## 2017-10-26 NOTE — Patient Instructions (Addendum)
Your procedure is scheduled on: Monday, November 05, 2017   Surgery Time:  7:15AM-8:45AM   Report to [facility] by 5:30 AM pick up the phone at the front desk dial [phone], have a seat in the lobby and a staff member will come and escort you to Short Stay Department   Call this number if you have problems the morning of surgery [phone]   Do not eat food or drink liquids :After Midnight.   Do NOT smoke after Midnight   Take these medicines the morning of surgery with A SIP OF WATER: Amlodipine, Nexium, Gabapentin, Venlafaxine, Metoprolol, Xanax if needed   Bring Asthma Inhaler day of surgery   Take 1/2 (half) bedtime Lantus dose the night before surgery   DO NOT TAKE ANY DIABETIC MEDICATIONS DAY OF YOUR SURGERY                               You may not have any metal on your body including hair pins, jewelry, and body piercings             Do not wear make-up, lotions, powders, perfumes/cologne, or deodorant             Do not wear nail polish.  Do not shave  48 hours prior to surgery.                Do not bring valuables to the hospital. [place].   Contacts, dentures or bridgework may not be worn into surgery.   Leave suitcase in the car. After surgery it may be brought to your room.   Special Instructions: Bring a copy of your healthcare power of attorney and living will documents         the day of surgery if you haven't scanned them in before.              Please read over the following fact sheets you were given:  [facility] - Preparing for Surgery Before surgery, you can play an important role.  Because skin is not sterile, your skin needs to be as free of germs as possible.  You can reduce the number of germs on your skin by washing with CHG (chlorahexidine gluconate) soap before surgery.  CHG is an antiseptic cleaner which kills germs and bonds with the skin to continue killing  germs even after washing. Please DO NOT use if you have an allergy to CHG or antibacterial soaps.  If your skin becomes reddened/irritated stop using the CHG and inform your nurse when you arrive at Short Stay. Do not shave (including legs and underarms) for at least 48 hours prior to the first CHG shower.  You may shave your face/neck.  Please follow these instructions carefully:  1.  Shower with CHG Soap the night before surgery and the  morning of surgery.  2.  If you choose to wash your hair, wash your hair first as usual with your normal  shampoo.  3.  After you shampoo, rinse your hair and body thoroughly to remove the shampoo.                             4.  Use CHG as you would any  other liquid soap.  You can apply chg directly to the skin and wash.  Gently with a scrungie or clean washcloth.  5.  Apply the CHG Soap to your body ONLY FROM THE NECK DOWN.   Do   not use on face/ open                           Wound or open sores. Avoid contact with eyes, ears mouth and   genitals (private parts).                       Wash face,  Genitals (private parts) with your normal soap.             6.  Wash thoroughly, paying special attention to the area where your    surgery  will be performed.  7.  Thoroughly rinse your body with warm water from the neck down.  8.  DO NOT shower/wash with your normal soap after using and rinsing off the CHG Soap.                9.  Pat yourself dry with a clean towel.            10.  Wear clean pajamas.            11.  Place clean sheets on your bed the night of your first shower and do not  sleep with pets. Day of Surgery : Do not apply any lotions/deodorants the morning of surgery.  Please wear clean clothes to the hospital/surgery center.  FAILURE TO FOLLOW THESE INSTRUCTIONS MAY RESULT IN THE CANCELLATION OF YOUR SURGERY  PATIENT SIGNATURE_________________________________  NURSE  SIGNATURE__________________________________  ________________________________________________________________________   Adam Phenix  An incentive spirometer is a tool that can help keep your lungs clear and active. This tool measures how well you are filling your lungs with each breath. Taking long deep breaths may help reverse or decrease the chance of developing breathing (pulmonary) problems (especially infection) following:  A long period of time when you are unable to move or be active. BEFORE THE PROCEDURE   If the spirometer includes an indicator to show your best effort, your nurse or respiratory therapist will set it to a desired goal.  If possible, sit up straight or lean slightly forward. Try not to slouch.  Hold the incentive spirometer in an upright position. INSTRUCTIONS FOR USE  1. Sit on the edge of your bed if possible, or sit up as far as you can in bed or on a chair. 2. Hold the incentive spirometer in an upright position. 3. Breathe out normally. 4. Place the mouthpiece in your mouth and seal your lips tightly around it. 5. Breathe in slowly and as deeply as possible, raising the piston or the ball toward the top of the column. 6. Hold your breath for 3-5 seconds or for as long as possible. Allow the piston or ball to fall to the bottom of the column. 7. Remove the mouthpiece from your mouth and breathe out normally. 8. Rest for a few seconds and repeat Steps 1 through 7 at least 10 times every 1-2 hours when you are awake. Take your time and take a few normal breaths between deep breaths. 9. The spirometer may include an indicator to show your best effort. Use the indicator as a goal to work toward during each repetition. 10. After each set of 10 deep breaths,  practice coughing to be sure your lungs are clear. If you have an incision (the cut made at the time of surgery), support your incision when coughing by placing a pillow or rolled up towels firmly  against it. Once you are able to get out of bed, walk around indoors and cough well. You may stop using the incentive spirometer when instructed by your caregiver.  RISKS AND COMPLICATIONS  Take your time so you do not get dizzy or light-headed.  If you are in pain, you may need to take or ask for pain medication before doing incentive spirometry. It is harder to take a deep breath if you are having pain. AFTER USE  Rest and breathe slowly and easily.  It can be helpful to keep track of a log of your progress. Your caregiver can provide you with a simple table to help with this. If you are using the spirometer at home, follow these instructions: [place] IF:   You are having difficultly using the spirometer.  You have trouble using the spirometer as often as instructed.  Your pain medication is not giving enough relief while using the spirometer.  You develop fever of 100.5 F (38.1 C) or higher. SEEK IMMEDIATE MEDICAL CARE IF:   You cough up bloody sputum that had not been present before.  You develop fever of 102 F (38.9 C) or greater.  You develop worsening pain at or near the incision site. MAKE SURE YOU:   Understand these instructions.  Will watch your condition.  Will get help right away if you are not doing well or get worse. Document Released: 12/25/2006 Document Revised: 11/06/2011 Document Reviewed: 02/25/2007 ExitCare Patient Information 2014 [employer], [place].   ________________________________________________________________________  WHAT IS A BLOOD TRANSFUSION? Blood Transfusion Information  A transfusion is the replacement of blood or some of its parts. Blood is made up of multiple cells which provide different functions.  Red blood cells carry oxygen and are used for blood loss replacement.  White blood cells fight against infection.  Platelets control bleeding.  Plasma helps clot blood.  Other blood products are available for  specialized needs, such as hemophilia or other clotting disorders. BEFORE THE TRANSFUSION  Who gives blood for transfusions?   Healthy volunteers who are fully evaluated to make sure their blood is safe. This is blood bank blood. Transfusion therapy is the safest it has ever been in the practice of medicine. Before blood is taken from a donor, a complete history is taken to make sure that [place] has no history of diseases nor engages in risky social behavior (examples are intravenous drug use or sexual activity with multiple partners). The donor's travel history is screened to minimize risk of transmitting infections, such as malaria. The donated blood is tested for signs of infectious diseases, such as HIV and hepatitis. The blood is then tested to be sure it is compatible with you in order to minimize the chance of a transfusion reaction. If you or a relative donates blood, this is often done in anticipation of surgery and is not appropriate for emergency situations. It takes many days to process the donated blood. RISKS AND COMPLICATIONS Although transfusion therapy is very safe and saves many lives, the main dangers of transfusion include:   Getting an infectious disease.  Developing a transfusion reaction. This is an allergic reaction to something in the blood you were given. Every precaution is taken to prevent this. The decision to have a blood transfusion has been  considered carefully by your caregiver before blood is given. Blood is not given unless the benefits outweigh the risks. AFTER THE TRANSFUSION  Right after receiving a blood transfusion, you will usually feel much better and more energetic. This is especially true if your red blood cells have gotten low (anemic). The transfusion raises the level of the red blood cells which carry oxygen, and this usually causes an energy increase.  The nurse administering the transfusion will monitor you carefully for complications. HOME CARE  INSTRUCTIONS  No special instructions are needed after a transfusion. You may find your energy is better. Speak with your caregiver about any limitations on activity for underlying diseases you may have. SEEK MEDICAL CARE IF:   Your condition is not improving after your transfusion.  You develop redness or irritation at the intravenous (IV) site. SEEK IMMEDIATE MEDICAL CARE IF:  Any of the following symptoms occur over the next 12 hours:  Shaking chills.  You have a temperature by mouth above 102 F (38.9 C), not controlled by medicine.  Chest, back, or muscle pain.  People around you feel you are not acting correctly or are confused.  Shortness of breath or difficulty breathing.  Dizziness and fainting.  You get a rash or develop hives.  You have a decrease in urine output.  Your urine turns a dark color or changes to pink, red, or brown. Any of the following symptoms occur over the next 10 days:  You have a temperature by mouth above 102 F (38.9 C), not controlled by medicine.  Shortness of breath.  Weakness after normal activity.  The white part of the eye turns yellow (jaundice).  You have a decrease in the amount of urine or are urinating less often.  Your urine turns a dark color or changes to pink, red, or brown. Document Released: 08/11/2000 Document Revised: 11/06/2011 Document Reviewed: 03/30/2008 [facility] Patient Information 2014 [place], [place].  _______________________________________________________________________

## 2017-10-29 DIAGNOSIS — I4891 Unspecified atrial fibrillation: Secondary | ICD-10-CM

## 2017-10-29 HISTORY — DX: Unspecified atrial fibrillation: I48.91

## 2017-10-30 ENCOUNTER — Encounter (HOSPITAL_COMMUNITY): Payer: Self-pay

## 2017-10-30 ENCOUNTER — Encounter (HOSPITAL_COMMUNITY)
Admission: RE | Admit: 2017-10-30 | Discharge: 2017-10-30 | Disposition: A | Discharge status: Home / Self Care | Payer: Medicare Other | Source: Ambulatory Visit | Attending: Orthopedic Surgery | Admitting: Orthopedic Surgery

## 2017-10-30 ENCOUNTER — Other Ambulatory Visit: Payer: Self-pay

## 2017-10-30 DIAGNOSIS — Z01812 Encounter for preprocedural laboratory examination: Secondary | ICD-10-CM | POA: Diagnosis not present

## 2017-10-30 DIAGNOSIS — Z01810 Encounter for preprocedural cardiovascular examination: Secondary | ICD-10-CM | POA: Insufficient documentation

## 2017-10-30 HISTORY — DX: Personal history of other diseases of the digestive system: Z87.19

## 2017-10-30 HISTORY — DX: Synovial cyst of popliteal space (Baker), left knee: M71.22

## 2017-10-30 HISTORY — DX: Unspecified atrial fibrillation: I48.91

## 2017-10-30 HISTORY — DX: Anxiety disorder, unspecified: F41.9

## 2017-10-30 HISTORY — DX: Solitary pulmonary nodule: R91.1

## 2017-10-30 HISTORY — DX: Personal history of diseases of the skin and subcutaneous tissue: Z87.2

## 2017-10-30 HISTORY — DX: Personal history of other specified conditions: Z87.898

## 2017-10-30 HISTORY — DX: Gastrointestinal hemorrhage, unspecified: K92.2

## 2017-10-30 HISTORY — DX: Constipation, unspecified: K59.00

## 2017-10-30 HISTORY — DX: Peripheral vascular disease, unspecified: I73.9

## 2017-10-30 HISTORY — DX: Iron deficiency anemia, unspecified: D50.9

## 2017-10-30 HISTORY — DX: Chronic kidney disease, unspecified: N18.9

## 2017-10-30 LAB — CBC
HCT: 40.5 % (ref 36.0–46.0)
HEMOGLOBIN: 13.3 g/dL (ref 12.0–15.0)
MCH: 29.5 pg (ref 26.0–34.0)
MCHC: 32.8 g/dL (ref 30.0–36.0)
MCV: 89.8 fL (ref 78.0–100.0)
Platelets: 244 10*3/uL (ref 150–400)
RBC: 4.51 MIL/uL (ref 3.87–5.11)
RDW: 13.4 % (ref 11.5–15.5)
WBC: 10.9 10*3/uL — AB (ref 4.0–10.5)

## 2017-10-30 LAB — BASIC METABOLIC PANEL WITH GFR
Anion gap: 9 (ref 5–15)
BUN: 24 mg/dL — ABNORMAL HIGH (ref 6–20)
CO2: 28 mmol/L (ref 22–32)
Calcium: 9.9 mg/dL (ref 8.9–10.3)
Chloride: 100 mmol/L — ABNORMAL LOW (ref 101–111)
Creatinine, Ser: 1.03 mg/dL — ABNORMAL HIGH (ref 0.44–1.00)
GFR calc Af Amer: 60 mL/min — ABNORMAL LOW
GFR calc non Af Amer: 51 mL/min — ABNORMAL LOW
Glucose, Bld: 260 mg/dL — ABNORMAL HIGH (ref 65–99)
Potassium: 4.8 mmol/L (ref 3.5–5.1)
Sodium: 137 mmol/L (ref 135–145)

## 2017-10-30 LAB — HEMOGLOBIN A1C
Hgb A1c MFr Bld: 6.5 % — ABNORMAL HIGH (ref 4.8–5.6)
Mean Plasma Glucose: 139.85 mg/dL

## 2017-10-30 LAB — TSH: TSH: 1.008 u[IU]/mL (ref 0.350–4.500)

## 2017-10-30 LAB — SURGICAL PCR SCREEN
MRSA, PCR: NEGATIVE
Staphylococcus aureus: NEGATIVE

## 2017-10-30 LAB — GLUCOSE, CAPILLARY: Glucose-Capillary: 259 mg/dL — ABNORMAL HIGH (ref 65–99)

## 2017-10-30 NOTE — Pre-Procedure Instructions (Addendum)
CBC, BMP results 10/30/2017 faxed to Dr. Alvan Dame via epic. TSH results 10/30/2017 faxed to Dr. Wynonia Lawman via epic.

## 2017-10-31 ENCOUNTER — Encounter (HOSPITAL_COMMUNITY): Payer: Self-pay

## 2017-10-31 NOTE — Pre-Procedure Instructions (Signed)
Last office visit note from Dr. Buddy Duty in chart Hgb A1C (7.1) 09/24/2017 in chart

## 2017-11-05 ENCOUNTER — Inpatient Hospital Stay (HOSPITAL_COMMUNITY): Payer: Medicare Other | Admitting: Anesthesiology

## 2017-11-05 ENCOUNTER — Encounter (HOSPITAL_COMMUNITY): Payer: Self-pay | Admitting: Emergency Medicine

## 2017-11-05 ENCOUNTER — Inpatient Hospital Stay (HOSPITAL_COMMUNITY)
Admission: RE | Admit: 2017-11-05 | Discharge: 2017-11-06 | DRG: 470 | Disposition: A | Discharge status: Home / Self Care | Payer: Medicare Other | Source: Ambulatory Visit | Attending: Orthopedic Surgery | Admitting: Orthopedic Surgery

## 2017-11-05 ENCOUNTER — Encounter (HOSPITAL_COMMUNITY)
Admission: RE | Disposition: A | Discharge status: Home / Self Care | Payer: Self-pay | Source: Ambulatory Visit | Attending: Orthopedic Surgery

## 2017-11-05 ENCOUNTER — Other Ambulatory Visit: Payer: Self-pay

## 2017-11-05 DIAGNOSIS — I4891 Unspecified atrial fibrillation: Secondary | ICD-10-CM | POA: Diagnosis present

## 2017-11-05 DIAGNOSIS — Z96659 Presence of unspecified artificial knee joint: Secondary | ICD-10-CM

## 2017-11-05 DIAGNOSIS — F419 Anxiety disorder, unspecified: Secondary | ICD-10-CM | POA: Diagnosis present

## 2017-11-05 DIAGNOSIS — J45909 Unspecified asthma, uncomplicated: Secondary | ICD-10-CM | POA: Diagnosis present

## 2017-11-05 DIAGNOSIS — Z79899 Other long term (current) drug therapy: Secondary | ICD-10-CM | POA: Diagnosis not present

## 2017-11-05 DIAGNOSIS — M25562 Pain in left knee: Secondary | ICD-10-CM | POA: Diagnosis present

## 2017-11-05 DIAGNOSIS — N183 Chronic kidney disease, stage 3 (moderate): Secondary | ICD-10-CM | POA: Diagnosis present

## 2017-11-05 DIAGNOSIS — F329 Major depressive disorder, single episode, unspecified: Secondary | ICD-10-CM | POA: Diagnosis present

## 2017-11-05 DIAGNOSIS — I129 Hypertensive chronic kidney disease with stage 1 through stage 4 chronic kidney disease, or unspecified chronic kidney disease: Secondary | ICD-10-CM | POA: Diagnosis present

## 2017-11-05 DIAGNOSIS — K219 Gastro-esophageal reflux disease without esophagitis: Secondary | ICD-10-CM | POA: Diagnosis present

## 2017-11-05 DIAGNOSIS — M65862 Other synovitis and tenosynovitis, left lower leg: Secondary | ICD-10-CM | POA: Diagnosis present

## 2017-11-05 DIAGNOSIS — M1712 Unilateral primary osteoarthritis, left knee: Secondary | ICD-10-CM | POA: Diagnosis present

## 2017-11-05 DIAGNOSIS — Z96652 Presence of left artificial knee joint: Secondary | ICD-10-CM

## 2017-11-05 HISTORY — PX: TOTAL KNEE ARTHROPLASTY: SHX125

## 2017-11-05 LAB — GLUCOSE, CAPILLARY
GLUCOSE-CAPILLARY: 113 mg/dL — AB (ref 65–99)
GLUCOSE-CAPILLARY: 206 mg/dL — AB (ref 65–99)
Glucose-Capillary: 178 mg/dL — ABNORMAL HIGH (ref 65–99)
Glucose-Capillary: 293 mg/dL — ABNORMAL HIGH (ref 65–99)
Glucose-Capillary: 308 mg/dL — ABNORMAL HIGH (ref 65–99)

## 2017-11-05 LAB — TYPE AND SCREEN
ABO/RH(D): A POS
Antibody Screen: NEGATIVE

## 2017-11-05 SURGERY — ARTHROPLASTY, KNEE, TOTAL
Anesthesia: General | Site: Knee | Laterality: Left

## 2017-11-05 MED ORDER — ASPIRIN 81 MG PO CHEW
81.0000 mg | CHEWABLE_TABLET | Freq: Two times a day (BID) | ORAL | Status: DC
  Administered 2017-11-05 – 2017-11-06 (×2): 81 mg via ORAL
  Filled 2017-11-05 (×2): qty 1

## 2017-11-05 MED ORDER — ALPRAZOLAM 0.25 MG PO TABS: 0.2500 mg | ORAL_TABLET | Freq: Every day | ORAL | Status: DC | PRN

## 2017-11-05 MED ORDER — ONDANSETRON HCL 4 MG/2ML IJ SOLN
INTRAMUSCULAR | Status: AC
  Filled 2017-11-05: qty 2

## 2017-11-05 MED ORDER — DEXTROSE 5 % IV SOLN
INTRAVENOUS | Status: DC | PRN
  Administered 2017-11-05: 50 ug/min via INTRAVENOUS

## 2017-11-05 MED ORDER — ALBUTEROL SULFATE (2.5 MG/3ML) 0.083% IN NEBU: 3.0000 mL | INHALATION_SOLUTION | RESPIRATORY_TRACT | Status: DC | PRN

## 2017-11-05 MED ORDER — KETOROLAC TROMETHAMINE 30 MG/ML IJ SOLN
INTRAMUSCULAR | Status: DC | PRN
  Administered 2017-11-05: 30 mg via INTRAVENOUS

## 2017-11-05 MED ORDER — TRANEXAMIC ACID 1000 MG/10ML IV SOLN
1000.0000 mg | INTRAVENOUS | Status: AC
  Administered 2017-11-05: 1000 mg via INTRAVENOUS
  Filled 2017-11-05: qty 1100

## 2017-11-05 MED ORDER — SODIUM CHLORIDE 0.9 % IJ SOLN
INTRAMUSCULAR | Status: AC
  Filled 2017-11-05: qty 50

## 2017-11-05 MED ORDER — ONDANSETRON HCL 4 MG PO TABS: 4.0000 mg | ORAL_TABLET | Freq: Four times a day (QID) | ORAL | Status: DC | PRN

## 2017-11-05 MED ORDER — MIDAZOLAM HCL 2 MG/2ML IJ SOLN
INTRAMUSCULAR | Status: AC
  Filled 2017-11-05: qty 2

## 2017-11-05 MED ORDER — TRAMADOL HCL 50 MG PO TABS
50.0000 mg | ORAL_TABLET | Freq: Four times a day (QID) | ORAL | Status: DC
  Administered 2017-11-05 – 2017-11-06 (×3): 50 mg via ORAL
  Filled 2017-11-05 (×3): qty 1

## 2017-11-05 MED ORDER — ESOMEPRAZOLE MAGNESIUM 20 MG PO CPDR
40.0000 mg | DELAYED_RELEASE_CAPSULE | Freq: Every day | ORAL | Status: DC
  Administered 2017-11-06: 40 mg via ORAL
  Filled 2017-11-05: qty 2

## 2017-11-05 MED ORDER — SUGAMMADEX SODIUM 200 MG/2ML IV SOLN
INTRAVENOUS | Status: AC
  Filled 2017-11-05: qty 2

## 2017-11-05 MED ORDER — GABAPENTIN 300 MG PO CAPS
600.0000 mg | ORAL_CAPSULE | Freq: Three times a day (TID) | ORAL | Status: DC
  Administered 2017-11-05 – 2017-11-06 (×3): 600 mg via ORAL
  Filled 2017-11-05 (×3): qty 2

## 2017-11-05 MED ORDER — PROPOFOL 500 MG/50ML IV EMUL
INTRAVENOUS | Status: DC | PRN
  Administered 2017-11-05: 140 ug/kg/min via INTRAVENOUS

## 2017-11-05 MED ORDER — FENTANYL CITRATE (PF) 250 MCG/5ML IJ SOLN
INTRAMUSCULAR | Status: AC
  Filled 2017-11-05: qty 5

## 2017-11-05 MED ORDER — DOCUSATE SODIUM 100 MG PO CAPS
100.0000 mg | ORAL_CAPSULE | Freq: Two times a day (BID) | ORAL | Status: DC
  Administered 2017-11-05 – 2017-11-06 (×2): 100 mg via ORAL
  Filled 2017-11-05 (×2): qty 1

## 2017-11-05 MED ORDER — CEFAZOLIN SODIUM-DEXTROSE 2-4 GM/100ML-% IV SOLN
2.0000 g | INTRAVENOUS | Status: AC
  Administered 2017-11-05: 2 g via INTRAVENOUS
  Filled 2017-11-05: qty 100

## 2017-11-05 MED ORDER — ALUM & MAG HYDROXIDE-SIMETH 200-200-20 MG/5ML PO SUSP: 15.0000 mL | ORAL | Status: DC | PRN

## 2017-11-05 MED ORDER — KETAMINE HCL 10 MG/ML IJ SOLN
INTRAMUSCULAR | Status: DC | PRN
  Administered 2017-11-05: 35 mg via INTRAVENOUS

## 2017-11-05 MED ORDER — FENTANYL CITRATE (PF) 100 MCG/2ML IJ SOLN
25.0000 ug | INTRAMUSCULAR | Status: DC | PRN
  Administered 2017-11-05: 25 ug via INTRAVENOUS
  Filled 2017-11-05: qty 2

## 2017-11-05 MED ORDER — ONDANSETRON HCL 4 MG/2ML IJ SOLN
INTRAMUSCULAR | Status: AC
  Administered 2017-11-05: 4 mg
  Filled 2017-11-05: qty 2

## 2017-11-05 MED ORDER — INSULIN GLARGINE 100 UNIT/ML ~~LOC~~ SOLN
55.0000 [IU] | Freq: Every day | SUBCUTANEOUS | Status: DC
  Administered 2017-11-05: 55 [IU] via SUBCUTANEOUS
  Filled 2017-11-05 (×2): qty 0.55

## 2017-11-05 MED ORDER — VENLAFAXINE HCL ER 37.5 MG PO CP24
37.5000 mg | ORAL_CAPSULE | Freq: Every day | ORAL | Status: DC
  Administered 2017-11-06: 08:00:00 37.5 mg via ORAL
  Filled 2017-11-05: qty 1

## 2017-11-05 MED ORDER — LIDOCAINE 2% (20 MG/ML) 5 ML SYRINGE
INTRAMUSCULAR | Status: AC
Start: 2017-11-05 — End: 2017-11-05
  Filled 2017-11-05: qty 5

## 2017-11-05 MED ORDER — TRANEXAMIC ACID 1000 MG/10ML IV SOLN
1000.0000 mg | Freq: Once | INTRAVENOUS | Status: AC
  Administered 2017-11-05: 1000 mg via INTRAVENOUS
  Filled 2017-11-05: qty 1100

## 2017-11-05 MED ORDER — CHLORHEXIDINE GLUCONATE 4 % EX LIQD: 60.0000 mL | Freq: Once | CUTANEOUS | Status: DC

## 2017-11-05 MED ORDER — OXYCODONE HCL 5 MG PO TABS: 5.0000 mg | ORAL_TABLET | Freq: Once | ORAL | Status: DC | PRN

## 2017-11-05 MED ORDER — DEXAMETHASONE SODIUM PHOSPHATE 10 MG/ML IJ SOLN: 10.0000 mg | Freq: Once | INTRAMUSCULAR | Status: DC

## 2017-11-05 MED ORDER — PROPOFOL 10 MG/ML IV BOLUS
INTRAVENOUS | Status: AC
  Filled 2017-11-05: qty 20

## 2017-11-05 MED ORDER — SODIUM CHLORIDE 0.9 % IJ SOLN
INTRAMUSCULAR | Status: DC | PRN
  Administered 2017-11-05: 29 mL via INTRAVENOUS

## 2017-11-05 MED ORDER — PHENOL 1.4 % MT LIQD: 1.0000 | OROMUCOSAL | Status: DC | PRN

## 2017-11-05 MED ORDER — METHOCARBAMOL 1000 MG/10ML IJ SOLN
500.0000 mg | Freq: Four times a day (QID) | INTRAVENOUS | Status: DC | PRN
  Administered 2017-11-05: 500 mg via INTRAVENOUS
  Filled 2017-11-05: qty 550

## 2017-11-05 MED ORDER — PHENYLEPHRINE HCL 10 MG/ML IJ SOLN
INTRAMUSCULAR | Status: AC
  Filled 2017-11-05: qty 1

## 2017-11-05 MED ORDER — BUPIVACAINE HCL (PF) 0.25 % IJ SOLN
INTRAMUSCULAR | Status: AC
  Filled 2017-11-05: qty 30

## 2017-11-05 MED ORDER — METOCLOPRAMIDE HCL 5 MG/ML IJ SOLN: 5.0000 mg | Freq: Three times a day (TID) | INTRAMUSCULAR | Status: DC | PRN

## 2017-11-05 MED ORDER — MIDAZOLAM HCL 2 MG/2ML IJ SOLN
INTRAMUSCULAR | Status: DC | PRN
  Administered 2017-11-05: 1 mg via INTRAVENOUS

## 2017-11-05 MED ORDER — ONDANSETRON HCL 4 MG/2ML IJ SOLN
INTRAMUSCULAR | Status: DC | PRN
  Administered 2017-11-05 (×2): 4 mg via INTRAVENOUS

## 2017-11-05 MED ORDER — PHENYLEPHRINE 40 MCG/ML (10ML) SYRINGE FOR IV PUSH (FOR BLOOD PRESSURE SUPPORT)
PREFILLED_SYRINGE | INTRAVENOUS | Status: DC | PRN
  Administered 2017-11-05 (×3): 80 ug via INTRAVENOUS

## 2017-11-05 MED ORDER — PHENYLEPHRINE 40 MCG/ML (10ML) SYRINGE FOR IV PUSH (FOR BLOOD PRESSURE SUPPORT)
PREFILLED_SYRINGE | INTRAVENOUS | Status: AC
  Filled 2017-11-05: qty 10

## 2017-11-05 MED ORDER — MENTHOL 3 MG MT LOZG: 1.0000 | LOZENGE | OROMUCOSAL | Status: DC | PRN

## 2017-11-05 MED ORDER — MAGNESIUM CITRATE PO SOLN: 1.0000 | Freq: Once | ORAL | Status: DC | PRN

## 2017-11-05 MED ORDER — SODIUM CHLORIDE 0.9 % IR SOLN
Status: DC | PRN
  Administered 2017-11-05: 1000 mL

## 2017-11-05 MED ORDER — DEXAMETHASONE SODIUM PHOSPHATE 10 MG/ML IJ SOLN
10.0000 mg | Freq: Once | INTRAMUSCULAR | Status: AC
  Administered 2017-11-06: 08:00:00 10 mg via INTRAVENOUS
  Filled 2017-11-05: qty 1

## 2017-11-05 MED ORDER — ROPIVACAINE HCL 7.5 MG/ML IJ SOLN
INTRAMUSCULAR | Status: DC | PRN
  Administered 2017-11-05: 20 mL via PERINEURAL

## 2017-11-05 MED ORDER — AMITRIPTYLINE HCL 50 MG PO TABS
50.0000 mg | ORAL_TABLET | Freq: Every day | ORAL | Status: DC
  Administered 2017-11-05: 50 mg via ORAL
  Filled 2017-11-05: qty 1

## 2017-11-05 MED ORDER — ROCURONIUM BROMIDE 10 MG/ML (PF) SYRINGE
PREFILLED_SYRINGE | INTRAVENOUS | Status: DC | PRN
  Administered 2017-11-05: 50 mg via INTRAVENOUS

## 2017-11-05 MED ORDER — BUPIVACAINE HCL (PF) 0.25 % IJ SOLN
INTRAMUSCULAR | Status: DC | PRN
  Administered 2017-11-05: 30 mL

## 2017-11-05 MED ORDER — METOCLOPRAMIDE HCL 5 MG PO TABS: 5.0000 mg | ORAL_TABLET | Freq: Three times a day (TID) | ORAL | Status: DC | PRN

## 2017-11-05 MED ORDER — LACTATED RINGERS IV SOLN
INTRAVENOUS | Status: DC | PRN
  Administered 2017-11-05 (×2): via INTRAVENOUS

## 2017-11-05 MED ORDER — SUGAMMADEX SODIUM 200 MG/2ML IV SOLN
INTRAVENOUS | Status: DC | PRN
  Administered 2017-11-05: 150 mg via INTRAVENOUS

## 2017-11-05 MED ORDER — METOPROLOL TARTRATE 25 MG PO TABS
25.0000 mg | ORAL_TABLET | Freq: Two times a day (BID) | ORAL | Status: DC
  Administered 2017-11-05 – 2017-11-06 (×2): 25 mg via ORAL
  Filled 2017-11-05 (×2): qty 1

## 2017-11-05 MED ORDER — LIDOCAINE 2% (20 MG/ML) 5 ML SYRINGE
INTRAMUSCULAR | Status: DC | PRN
  Administered 2017-11-05: 80 mg via INTRAVENOUS

## 2017-11-05 MED ORDER — KETAMINE HCL 10 MG/ML IJ SOLN
INTRAMUSCULAR | Status: AC
Start: 2017-11-05 — End: 2017-11-05
  Filled 2017-11-05: qty 1

## 2017-11-05 MED ORDER — DIPHENHYDRAMINE HCL 12.5 MG/5ML PO ELIX: 12.5000 mg | ORAL_SOLUTION | ORAL | Status: DC | PRN

## 2017-11-05 MED ORDER — ONDANSETRON HCL 4 MG/2ML IJ SOLN: 4.0000 mg | Freq: Four times a day (QID) | INTRAMUSCULAR | Status: DC | PRN

## 2017-11-05 MED ORDER — AMLODIPINE BESYLATE 10 MG PO TABS
10.0000 mg | ORAL_TABLET | Freq: Every day | ORAL | Status: DC
  Administered 2017-11-06: 10 mg via ORAL
  Filled 2017-11-05: qty 1

## 2017-11-05 MED ORDER — FERROUS SULFATE 325 (65 FE) MG PO TABS
325.0000 mg | ORAL_TABLET | Freq: Three times a day (TID) | ORAL | Status: DC
  Administered 2017-11-06 (×2): 325 mg via ORAL
  Filled 2017-11-05 (×2): qty 1

## 2017-11-05 MED ORDER — BISACODYL 10 MG RE SUPP: 10.0000 mg | Freq: Every day | RECTAL | Status: DC | PRN

## 2017-11-05 MED ORDER — ROCURONIUM BROMIDE 10 MG/ML (PF) SYRINGE
PREFILLED_SYRINGE | INTRAVENOUS | Status: AC
Start: 2017-11-05 — End: 2017-11-05
  Filled 2017-11-05: qty 5

## 2017-11-05 MED ORDER — KETOROLAC TROMETHAMINE 30 MG/ML IJ SOLN
INTRAMUSCULAR | Status: AC
Start: 2017-11-05 — End: 2017-11-05
  Filled 2017-11-05: qty 1

## 2017-11-05 MED ORDER — FENTANYL CITRATE (PF) 100 MCG/2ML IJ SOLN
INTRAMUSCULAR | Status: DC | PRN
  Administered 2017-11-05 (×5): 50 ug via INTRAVENOUS

## 2017-11-05 MED ORDER — SODIUM CHLORIDE 0.9 % IV SOLN
INTRAVENOUS | Status: DC
  Administered 2017-11-05 – 2017-11-06 (×2): via INTRAVENOUS

## 2017-11-05 MED ORDER — TRAMADOL HCL 50 MG PO TABS
50.0000 mg | ORAL_TABLET | Freq: Four times a day (QID) | ORAL | Status: DC
  Administered 2017-11-05: 15:00:00 50 mg via ORAL
  Filled 2017-11-05: qty 1

## 2017-11-05 MED ORDER — FENTANYL CITRATE (PF) 100 MCG/2ML IJ SOLN
INTRAMUSCULAR | Status: AC
  Filled 2017-11-05: qty 2

## 2017-11-05 MED ORDER — LOSARTAN POTASSIUM 50 MG PO TABS
100.0000 mg | ORAL_TABLET | Freq: Every day | ORAL | Status: DC
  Administered 2017-11-06: 100 mg via ORAL
  Filled 2017-11-05: qty 2

## 2017-11-05 MED ORDER — HYDROCODONE-ACETAMINOPHEN 5-325 MG PO TABS
1.0000 | ORAL_TABLET | ORAL | Status: DC | PRN
  Administered 2017-11-05: 1 via ORAL
  Administered 2017-11-05 – 2017-11-06 (×3): 2 via ORAL
  Filled 2017-11-05: qty 2
  Filled 2017-11-05: qty 1
  Filled 2017-11-05: qty 2
  Filled 2017-11-05 (×2): qty 1

## 2017-11-05 MED ORDER — METHOCARBAMOL 500 MG PO TABS
500.0000 mg | ORAL_TABLET | Freq: Four times a day (QID) | ORAL | Status: DC | PRN
Start: 2017-11-05 — End: 2017-11-06
  Administered 2017-11-05 – 2017-11-06 (×2): 500 mg via ORAL
  Filled 2017-11-05 (×3): qty 1

## 2017-11-05 MED ORDER — INSULIN ASPART 100 UNIT/ML ~~LOC~~ SOLN
0.0000 [IU] | Freq: Three times a day (TID) | SUBCUTANEOUS | Status: DC
  Administered 2017-11-05: 11 [IU] via SUBCUTANEOUS
  Administered 2017-11-05 – 2017-11-06 (×2): 7 [IU] via SUBCUTANEOUS
  Administered 2017-11-06: 14:00:00 15 [IU] via SUBCUTANEOUS

## 2017-11-05 MED ORDER — DEXAMETHASONE SODIUM PHOSPHATE 10 MG/ML IJ SOLN
INTRAMUSCULAR | Status: DC | PRN
  Administered 2017-11-05: 10 mg via INTRAVENOUS

## 2017-11-05 MED ORDER — OXYCODONE HCL 5 MG/5ML PO SOLN
5.0000 mg | Freq: Once | ORAL | Status: DC | PRN
  Filled 2017-11-05: qty 5

## 2017-11-05 MED ORDER — HYDROMORPHONE HCL 1 MG/ML IJ SOLN: 0.2500 mg | INTRAMUSCULAR | Status: DC | PRN

## 2017-11-05 MED ORDER — POLYETHYLENE GLYCOL 3350 17 G PO PACK
17.0000 g | PACK | Freq: Two times a day (BID) | ORAL | Status: DC
  Administered 2017-11-05 – 2017-11-06 (×2): 17 g via ORAL
  Filled 2017-11-05 (×2): qty 1

## 2017-11-05 MED ORDER — STERILE WATER FOR IRRIGATION IR SOLN
Status: DC | PRN
  Administered 2017-11-05: 2000 mL

## 2017-11-05 MED ORDER — DEXAMETHASONE SODIUM PHOSPHATE 10 MG/ML IJ SOLN
INTRAMUSCULAR | Status: AC
  Filled 2017-11-05: qty 1

## 2017-11-05 MED ORDER — PROPOFOL 10 MG/ML IV BOLUS
INTRAVENOUS | Status: DC | PRN
  Administered 2017-11-05: 110 mg via INTRAVENOUS

## 2017-11-05 MED ORDER — TRIAMTERENE-HCTZ 37.5-25 MG PO CAPS
2.0000 | ORAL_CAPSULE | Freq: Every day | ORAL | Status: DC
  Administered 2017-11-06: 2 via ORAL
  Filled 2017-11-05 (×3): qty 2

## 2017-11-05 MED ORDER — HYDROMORPHONE HCL 1 MG/ML IJ SOLN
INTRAMUSCULAR | Status: AC
  Administered 2017-11-05: 0.5 mg
  Filled 2017-11-05: qty 1

## 2017-11-05 MED ORDER — 0.9 % SODIUM CHLORIDE (POUR BTL) OPTIME
TOPICAL | Status: DC | PRN
  Administered 2017-11-05: 1000 mL

## 2017-11-05 MED ORDER — CEFAZOLIN SODIUM-DEXTROSE 2-4 GM/100ML-% IV SOLN
2.0000 g | Freq: Four times a day (QID) | INTRAVENOUS | Status: AC
  Administered 2017-11-05 (×2): 2 g via INTRAVENOUS
  Filled 2017-11-05 (×2): qty 100

## 2017-11-05 SURGICAL SUPPLY — 49 items
BAG DECANTER FOR FLEXI CONT (MISCELLANEOUS) IMPLANT
BAG ZIPLOCK 12X15 (MISCELLANEOUS) IMPLANT
BANDAGE ACE 6X5 VEL STRL LF (GAUZE/BANDAGES/DRESSINGS) ×3 IMPLANT
BLADE SAW SGTL 11.0X1.19X90.0M (BLADE) ×3 IMPLANT
BLADE SAW SGTL 13.0X1.19X90.0M (BLADE) ×3 IMPLANT
BONE CEMENT GENTAMICIN (Cement) ×6 IMPLANT
BOWL SMART MIX CTS (DISPOSABLE) ×3 IMPLANT
CAPT KNEE TOTAL 3 ATTUNE ×3 IMPLANT
CEMENT BONE GENTAMICIN 40 (Cement) ×2 IMPLANT
COVER SURGICAL LIGHT HANDLE (MISCELLANEOUS) ×3 IMPLANT
CUFF TOURN SGL QUICK 34 (TOURNIQUET CUFF) ×2
CUFF TRNQT CYL 34X4X40X1 (TOURNIQUET CUFF) ×1 IMPLANT
DECANTER SPIKE VIAL GLASS SM (MISCELLANEOUS) ×3 IMPLANT
DERMABOND ADVANCED (GAUZE/BANDAGES/DRESSINGS) ×2
DERMABOND ADVANCED .7 DNX12 (GAUZE/BANDAGES/DRESSINGS) ×1 IMPLANT
DRAPE TOP 10253 STERILE (DRAPES) IMPLANT
DRAPE U-SHAPE 47X51 STRL (DRAPES) ×3 IMPLANT
DRESSING AQUACEL AG SP 3.5X10 (GAUZE/BANDAGES/DRESSINGS) ×1 IMPLANT
DRSG AQUACEL AG SP 3.5X10 (GAUZE/BANDAGES/DRESSINGS) ×3
DURAPREP 26ML APPLICATOR (WOUND CARE) ×6 IMPLANT
ELECT REM PT RETURN 15FT ADLT (MISCELLANEOUS) ×3 IMPLANT
GLOVE BIOGEL PI IND STRL 7.0 (GLOVE) ×2 IMPLANT
GLOVE BIOGEL PI IND STRL 7.5 (GLOVE) ×2 IMPLANT
GLOVE BIOGEL PI IND STRL 8.5 (GLOVE) ×1 IMPLANT
GLOVE BIOGEL PI IND STRL 9 (GLOVE) ×1 IMPLANT
GLOVE BIOGEL PI INDICATOR 7.0 (GLOVE) ×4
GLOVE BIOGEL PI INDICATOR 7.5 (GLOVE) ×4
GLOVE BIOGEL PI INDICATOR 8.5 (GLOVE) ×2
GLOVE BIOGEL PI INDICATOR 9 (GLOVE) ×2
GLOVE ECLIPSE 7.5 STRL STRAW (GLOVE) ×3 IMPLANT
GLOVE ECLIPSE 8.0 STRL XLNG CF (GLOVE) ×3 IMPLANT
GLOVE ORTHO TXT STRL SZ7.5 (GLOVE) ×6 IMPLANT
GOWN STRL REUS W/TWL LRG LVL3 (GOWN DISPOSABLE) ×3 IMPLANT
GOWN STRL REUS W/TWL XL LVL3 (GOWN DISPOSABLE) ×3 IMPLANT
HANDPIECE INTERPULSE COAX TIP (DISPOSABLE) ×2
MANIFOLD NEPTUNE II (INSTRUMENTS) ×3 IMPLANT
PACK TOTAL KNEE CUSTOM (KITS) ×3 IMPLANT
POSITIONER SURGICAL ARM (MISCELLANEOUS) ×3 IMPLANT
SET HNDPC FAN SPRY TIP SCT (DISPOSABLE) ×1 IMPLANT
SET PAD KNEE POSITIONER (MISCELLANEOUS) ×3 IMPLANT
SUT MNCRL AB 4-0 PS2 18 (SUTURE) ×3 IMPLANT
SUT STRATAFIX PDS+ 0 24IN (SUTURE) ×6 IMPLANT
SUT VIC AB 1 CT1 36 (SUTURE) ×3 IMPLANT
SUT VIC AB 2-0 CT1 27 (SUTURE) ×6
SUT VIC AB 2-0 CT1 TAPERPNT 27 (SUTURE) ×3 IMPLANT
SYR 50ML LL SCALE MARK (SYRINGE) ×3 IMPLANT
TRAY FOLEY CATH SILVER 14FR (SET/KITS/TRAYS/PACK) ×3 IMPLANT
WRAP KNEE MAXI GEL POST OP (GAUZE/BANDAGES/DRESSINGS) ×3 IMPLANT
YANKAUER SUCT BULB TIP 10FT TU (MISCELLANEOUS) ×3 IMPLANT

## 2017-11-05 NOTE — Interval H&P Note (Signed)
History and Physical Interval Note:  11/05/2017 7:16 AM  [name]  has presented today for surgery, with the diagnosis of Left knee osteoarthritis  The various methods of treatment have been discussed with the patient and family. After consideration of risks, benefits and other options for treatment, the patient has consented to  Procedure(s) with comments: LEFT TOTAL KNEE ARTHROPLASTY (Left) - Adductor Block as a surgical intervention .  The patient's history has been reviewed, patient examined, no change in status, stable for surgery.  I have reviewed the patient's chart and labs.  Questions were answered to the patient's satisfaction.     [name]

## 2017-11-05 NOTE — Evaluation (Signed)
Physical Therapy Evaluation Patient Details Name: [name] MRN: [id number] DOB: June 08, 1941 Today's Date: 11/05/2017   History of Present Illness  Pt is a 77 year old female s/p L TKA with hx of DM, PVD, spinal cord stimulator, R TKA  Clinical Impression  Pt is s/p TKA resulting in the deficits listed below (see PT Problem List).  Pt will benefit from skilled PT to increase their independence and safety with mobility to allow discharge to the venue listed below.  Pt assisted with ambulating a few feet POD #0 and encouraged to sit in recliner.  Pt reports she plans to d/c home with spouse and start outpatient PT on Friday.     Follow Up Recommendations Outpatient PT;Follow surgeon's recommendation for DC plan and follow-up therapies    Equipment Recommendations  None recommended by PT    Recommendations for Other Services       Precautions / Restrictions Precautions Precautions: Knee Restrictions Other Position/Activity Restrictions: WBAT      Mobility  Bed Mobility Overal bed mobility: Needs Assistance Bed Mobility: Supine to Sit     Supine to sit: Min assist;HOB elevated     General bed mobility comments: verbal cues for technique, assist for L LE  Transfers Overall transfer level: Needs assistance Equipment used: Rolling walker (2 wheeled) Transfers: Sit to/from Stand Sit to Stand: Min assist         General transfer comment: verbal cues for UE and LE positioning  Ambulation/Gait Ambulation/Gait assistance: Min assist Ambulation Distance (Feet): 6 Feet Assistive device: Rolling walker (2 wheeled) Gait Pattern/deviations: Step-to pattern;Decreased stance time - left;Antalgic     General Gait Details: verbal cues for sequence, step length, posture, distance limited due to pain, recliner brought behind pt (RN gave more pain meds after sitting in recliner)  Stairs            Wheelchair Mobility    Modified Rankin (Stroke Patients Only)        Balance                                             Pertinent Vitals/Pain Pain Assessment: 0-10 Pain Score: 3  Pain Location: L knee Pain Descriptors / Indicators: Sore Pain Intervention(s): Limited activity within patient's tolerance;Repositioned;RN gave pain meds during session;Monitored during session    Home Living Family/patient expects to be discharged to:: Private residence Living Arrangements: Spouse/significant other   Type of Home: House Home Access: Stairs to enter Entrance Stairs-Rails: None Technical brewer of Steps: 2   Home Equipment: Environmental consultant - 2 wheels      Prior Function Level of Independence: Independent               Hand Dominance        Extremity/Trunk Assessment        Lower Extremity Assessment Lower Extremity Assessment: LLE deficits/detail LLE Deficits / Details: able to perform SLR however slight lag present, able to perform ankle pumps, observed at least 80* AROM knee flexion with sitting EOB       Communication   Communication: No difficulties  Cognition Arousal/Alertness: Awake/alert Behavior During Therapy: WFL for tasks assessed/performed Overall Cognitive Status: Within Functional Limits for tasks assessed  General Comments      Exercises     Assessment/Plan    PT Assessment Patient needs continued PT services  PT Problem List Decreased strength;Decreased mobility;Decreased range of motion;Decreased knowledge of use of DME;Pain;Decreased activity tolerance;Decreased knowledge of precautions       PT Treatment Interventions Stair training;Gait training;Therapeutic exercise;DME instruction;Therapeutic activities;Patient/family education;Functional mobility training    PT Goals (Current goals can be found in the Care Plan section)  Acute Rehab PT Goals PT Goal Formulation: With patient/family Time For Goal Achievement:  11/10/17 Potential to Achieve Goals: Good    Frequency 7X/week   Barriers to discharge        Co-evaluation               AM-PAC PT "6 Clicks" Daily Activity  Outcome Measure Difficulty turning over in bed (including adjusting bedclothes, sheets and blankets)?: Unable Difficulty moving from lying on back to sitting on the side of the bed? : Unable Difficulty sitting down on and standing up from a chair with arms (e.g., wheelchair, bedside commode, etc,.)?: Unable Help needed moving to and from a bed to chair (including a wheelchair)?: A Little Help needed walking in hospital room?: A Little Help needed climbing 3-5 steps with a railing? : A Lot 6 Click Score: 11    End of Session Equipment Utilized During Treatment: Gait belt Activity Tolerance: Patient limited by pain Patient left: in chair;with chair alarm set;with call bell/phone within reach;with nursing/sitter in room;with family/visitor present Nurse Communication: Mobility status PT Visit Diagnosis: Other abnormalities of gait and mobility (R26.89)    Time: 9528-4132 PT Time Calculation (min) (ACUTE ONLY): 15 min   Charges:   PT Evaluation $PT Eval Low Complexity: 1 Low     PT G CodesCarmelia Bake, PT, DPT 11/05/2017 Pager: [phone]  York Ram E 11/05/2017, 3:27 PM

## 2017-11-05 NOTE — Anesthesia Procedure Notes (Signed)
Date/Time: 11/05/2017 7:01 AM Performed by: Sharlette Dense, CRNA Oxygen Delivery Method: Nasal cannula

## 2017-11-05 NOTE — Discharge Instructions (Signed)

## 2017-11-05 NOTE — Anesthesia Procedure Notes (Signed)
Anesthesia Regional Block: Adductor canal block   Pre-Anesthetic Checklist: ,, timeout performed, Correct Patient, Correct Site, Correct Laterality, Correct Procedure, Correct Position, site marked, Risks and benefits discussed,  Surgical consent,  Pre-op evaluation,  At surgeon's request and post-op pain management  Laterality: Left  Prep: chloraprep       Needles:  Injection technique: Single-shot  Needle Type: Echogenic Needle     Needle Length: 9cm  Needle Gauge: 21     Additional Needles:   Narrative:  Start time: 11/05/2017 7:07 AM End time: 11/05/2017 7:14 AM Injection made incrementally with aspirations every 5 mL.  Performed by: Personally  Anesthesiologist: Albertha Ghee, MD  Additional Notes: Pt tolerated the procedure well.

## 2017-11-05 NOTE — Anesthesia Postprocedure Evaluation (Signed)
Anesthesia Post Note  Patient: [name]  Procedure(s) Performed: LEFT TOTAL KNEE ARTHROPLASTY (Left Knee)     Patient location during evaluation: PACU Anesthesia Type: General Level of consciousness: awake and alert Pain management: pain level controlled Vital Signs Assessment: post-procedure vital signs reviewed and stable Respiratory status: spontaneous breathing, nonlabored ventilation, respiratory function stable and patient connected to nasal cannula oxygen Cardiovascular status: blood pressure returned to baseline and stable Postop Assessment: no apparent nausea or vomiting Anesthetic complications: no    Last Vitals:  Vitals:   11/05/17 1045 11/05/17 1100  BP: 125/68 133/74  Pulse: 72 71  Resp: 13 13  Temp:  36.4 C  SpO2: 94% 97%    Last Pain:  Vitals:   11/05/17 0927  TempSrc:   PainSc: Asleep                 Jalexia Lalli S

## 2017-11-05 NOTE — Transfer of Care (Signed)
Immediate Anesthesia Transfer of Care Note  Patient: [name]  Procedure(s) Performed: LEFT TOTAL KNEE ARTHROPLASTY (Left Knee)  Patient Location: PACU  Anesthesia Type:GA combined with regional for post-op pain  Level of Consciousness: awake  Airway & Oxygen Therapy: Patient Spontanous Breathing and Patient connected to face mask oxygen  Post-op Assessment: Report given to RN and Post -op Vital signs reviewed and stable  Post vital signs: Reviewed and stable  Last Vitals:  Vitals:   11/05/17 0546 11/05/17 0927  BP: 129/88 134/63  Pulse: 73 74  Resp: 16 12  Temp: 37.3 C   SpO2: 100% (!) 88%    Last Pain:  Vitals:   11/05/17 0600  TempSrc:   PainSc: 0-No pain      Patients Stated Pain Goal: 4 (32/91/91 6606)  Complications: No apparent anesthesia complications

## 2017-11-05 NOTE — Anesthesia Preprocedure Evaluation (Signed)
Anesthesia Evaluation  Patient identified by MRN, date of birth, ID band Patient awake    Reviewed: Allergy & Precautions, H&P , NPO status , Patient's Chart, lab work & pertinent test results  History of Anesthesia Complications (+) PONV and history of anesthetic complications  Airway Mallampati: II   Neck ROM: full    Dental   Pulmonary asthma ,    breath sounds clear to auscultation       Cardiovascular hypertension, + Peripheral Vascular Disease   Rhythm:regular Rate:Normal     Neuro/Psych PSYCHIATRIC DISORDERS Anxiety Depression Spinal cord stimulator in place  Neuromuscular disease    GI/Hepatic hiatal hernia, GERD  ,  Endo/Other  diabetes, Type 2  Renal/GU      Musculoskeletal  (+) Arthritis ,   Abdominal   Peds  Hematology  (+) anemia ,   Anesthesia Other Findings   Reproductive/Obstetrics                             Anesthesia Physical Anesthesia Plan  ASA: III  Anesthesia Plan: General   Post-op Pain Management:    Induction: Intravenous  PONV Risk Score and Plan: 4 or greater and Ondansetron, Treatment may vary due to age or medical condition and Propofol infusion  Airway Management Planned: LMA  Additional Equipment:   Intra-op Plan:   Post-operative Plan:   Informed Consent: I have reviewed the patients History and Physical, chart, labs and discussed the procedure including the risks, benefits and alternatives for the proposed anesthesia with the patient or authorized representative who has indicated his/her understanding and acceptance.     Plan Discussed with: CRNA, Anesthesiologist and Surgeon  Anesthesia Plan Comments:         Anesthesia Quick Evaluation

## 2017-11-05 NOTE — Op Note (Signed)
NAME:  [name]                      MEDICAL RECORD NO.:  [id number]                             FACILITY:  [facility]      PHYSICIAN:  Pietro Cassis. Alvan Dame, M.D.  DATE OF BIRTH:  09/22/1940      DATE OF PROCEDURE:  11/05/2017                                     OPERATIVE REPORT         PREOPERATIVE DIAGNOSIS:  Left knee osteoarthritis.      POSTOPERATIVE DIAGNOSIS:  Left knee osteoarthritis.      FINDINGS:  The patient was noted to have complete loss of cartilage and   bone-on-bone arthritis with associated osteophytes in the lateral and patellofemoral compartments of   the knee with a significant synovitis and associated effusion.      PROCEDURE:  Left total knee replacement.      COMPONENTS USED:  DePuy Attune rotating platform posterior stabilized knee   system, a size 3N femur, 3 tibia, size 6 mm PS AOX insert, and 35 anatomic patellar   button.      SURGEON:  Pietro Cassis. Alvan Dame, M.D.      ASSISTANT:  Danae Orleans, PA-C.      ANESTHESIA:  General and Regional.      SPECIMENS:  None.      COMPLICATION:  None.      DRAINS: None.  EBL: <100cc      TOURNIQUET TIME:   Total Tourniquet Time Documented: Thigh (Left) - 23 minutes Total: Thigh (Left) - 23 minutes  .      The patient was stable to the recovery room.      INDICATION FOR PROCEDURE:  [name] is a 77 y.o. female patient of   mine.  The patient had been seen, evaluated, and treated conservatively in the   office with medication, activity modification, and injections.  The patient had   radiographic changes of bone-on-bone arthritis with endplate sclerosis and osteophytes noted.      The patient failed conservative measures including medication, injections, and activity modification, and at this point was ready for more definitive measures.   Based on the radiographic changes and failed conservative measures, the patient   decided to proceed with total knee replacement.  Risks of infection,   DVT,  component failure, need for revision surgery, postop course, and   expectations were all   discussed and reviewed.  Consent was obtained for benefit of pain   relief.      PROCEDURE IN DETAIL:  The patient was brought to the operative theater.   Once adequate anesthesia, preoperative antibiotics, 2 gm of Ancef, 1 gm of Tranexamic Acid, and 10 mg of Decadron administered, the patient was positioned supine with the left thigh tourniquet placed.  The  left lower extremity was prepped and draped in sterile fashion.  A time-   out was performed identifying the patient, planned procedure, and   extremity.      The left lower extremity was placed in the [facility] leg holder.  The leg was   exsanguinated, tourniquet elevated to 250 mmHg.  A midline incision was  made followed by median parapatellar arthrotomy.  Following initial   exposure, attention was first directed to the patella.  Precut   measurement was noted to be 21-22 mm.  I resected down to 13-14 mm and used a   35 anatomic patellar button to restore patellar height as well as cover the cut   surface.      The lug holes were drilled and a metal shim was placed to protect the   patella from retractors and saw blades.      At this point, attention was now directed to the femur.  The femoral   canal was opened with a drill, irrigated to try to prevent fat emboli.  An   intramedullary rod was passed at 3 degrees valgus, 9 mm of bone was   resected off the distal femur.  Following this resection, the tibia was   subluxated anteriorly.  Using the extramedullary guide, 3 mm of bone was resected off   the proximal medial tibia.  We confirmed the gap would be   stable medially and laterally with a size 5 spacer block as well as confirmed   the cut was perpendicular in the coronal plane, checking with an alignment rod.      Once this was done, I sized the femur to be a size 3 in the anterior-   posterior dimension, chose a narrow component  based on medial and   lateral dimension.  The size 3 rotation block was then pinned in   position anterior referenced using the C-clamp to set rotation.  The   anterior, posterior, and  chamfer cuts were made without difficulty nor   notching making certain that I was along the anterior cortex to help   with flexion gap stability.      The final box cut was made off the lateral aspect of distal femur.      At this point, the tibia was sized to be a size 3, the size 3 tray was   then pinned in position through the medial third of the tubercle,   drilled, and keel punched.  Trial reduction was now carried with a 3 femur,  3 tibia, a size 5 then 6 mm PS insert, and the 35 anatomic patella botton.  The knee was brought to   extension, full extension with good flexion stability with the patella   tracking through the trochlea without application of pressure.  Given   all these findings the femoral lug holes were drilled and then the trial components removed.  Final components were   opened and cement was mixed.  The knee was irrigated with normal saline   solution and pulse lavage.  The synovial lining was   then injected with 30 cc of 0.25% Marcaine with epinephrine and 1 cc of Toradol plus 30 cc of NS for a   total of 61 cc.      The knee was irrigated.  Final implants were then cemented onto clean and   dried cut surfaces of bone with the knee brought to extension with a size 6 mm PS trial insert.      Once the cement had fully cured, the excess cement was removed   throughout the knee.  I confirmed I was satisfied with the range of   motion and stability, and the final size 6 mm PS AOX insert was chosen.  It was   placed into the knee.      The tourniquet had  been let down at 23 minutes.  No significant   hemostasis required.  The   extensor mechanism was then reapproximated using #1 Vicryl and #1 Stratafix sutures with the knee   in flexion.  The   remaining wound was closed with 2-0  Vicryl and running 4-0 Monocryl.   The knee was cleaned, dried, dressed sterilely using Dermabond and   Aquacel dressing.  The patient was then   brought to recovery room in stable condition, tolerating the procedure   well.   Please note that Physician Assistant, Danae Orleans, PA-C, was present for the entirety of the case, and was utilized for pre-operative positioning, peri-operative retractor management, general facilitation of the procedure.  He was also utilized for primary wound closure at the end of the case.              Pietro Cassis Alvan Dame, M.D.    11/05/2017 8:51 AM

## 2017-11-05 NOTE — Anesthesia Procedure Notes (Signed)
Procedure Name: Intubation Date/Time: 11/05/2017 7:31 AM Performed by: Sharlette Dense, CRNA Patient Re-evaluated:Patient Re-evaluated prior to induction Oxygen Delivery Method: Circle system utilized Preoxygenation: Pre-oxygenation with 100% oxygen Induction Type: IV induction Ventilation: Mask ventilation without difficulty and Oral airway inserted - appropriate to patient size Laryngoscope Size: Miller and 2 Tube type: Oral Tube size: 7.5 mm Number of attempts: 1 Airway Equipment and Method: Stylet Placement Confirmation: ETT inserted through vocal cords under direct vision,  positive ETCO2 and breath sounds checked- equal and bilateral Secured at: 21 cm Tube secured with: Tape Dental Injury: Teeth and Oropharynx as per pre-operative assessment

## 2017-11-06 ENCOUNTER — Encounter (HOSPITAL_COMMUNITY): Payer: Self-pay | Admitting: Orthopedic Surgery

## 2017-11-06 LAB — GLUCOSE, CAPILLARY
GLUCOSE-CAPILLARY: 336 mg/dL — AB (ref 65–99)
Glucose-Capillary: 223 mg/dL — ABNORMAL HIGH (ref 65–99)
Glucose-Capillary: 244 mg/dL — ABNORMAL HIGH (ref 65–99)

## 2017-11-06 LAB — BASIC METABOLIC PANEL
Anion gap: 7 (ref 5–15)
BUN: 27 mg/dL — AB (ref 6–20)
CHLORIDE: 104 mmol/L (ref 101–111)
CO2: 27 mmol/L (ref 22–32)
Calcium: 8.4 mg/dL — ABNORMAL LOW (ref 8.9–10.3)
Creatinine, Ser: 1 mg/dL (ref 0.44–1.00)
GFR calc non Af Amer: 53 mL/min — ABNORMAL LOW (ref >60)
Glucose, Bld: 263 mg/dL — ABNORMAL HIGH (ref 65–99)
Potassium: 4.3 mmol/L (ref 3.5–5.1)
Sodium: 138 mmol/L (ref 135–145)

## 2017-11-06 LAB — CBC
HEMATOCRIT: 29.4 % — AB (ref 36.0–46.0)
HEMOGLOBIN: 9.6 g/dL — AB (ref 12.0–15.0)
MCH: 29.7 pg (ref 26.0–34.0)
MCHC: 32.7 g/dL (ref 30.0–36.0)
MCV: 91 fL (ref 78.0–100.0)
Platelets: 206 10*3/uL (ref 150–400)
RBC: 3.23 MIL/uL — ABNORMAL LOW (ref 3.87–5.11)
RDW: 13.3 % (ref 11.5–15.5)
WBC: 9.9 10*3/uL (ref 4.0–10.5)

## 2017-11-06 MED ORDER — POLYETHYLENE GLYCOL 3350 17 G PO PACK: 17.0000 g | PACK | Freq: Two times a day (BID) | ORAL | 0 refills | Status: DC

## 2017-11-06 MED ORDER — FERROUS SULFATE 325 (65 FE) MG PO TABS: 325.0000 mg | ORAL_TABLET | Freq: Three times a day (TID) | ORAL | 3 refills | Status: DC

## 2017-11-06 MED ORDER — RIVAROXABAN 10 MG PO TABS: 10.0000 mg | ORAL_TABLET | Freq: Every day | ORAL | 0 refills | Status: DC

## 2017-11-06 MED ORDER — DOCUSATE SODIUM 100 MG PO CAPS: 100.0000 mg | ORAL_CAPSULE | Freq: Two times a day (BID) | ORAL | 0 refills | Status: DC

## 2017-11-06 MED ORDER — METHOCARBAMOL 500 MG PO TABS: 500.0000 mg | ORAL_TABLET | Freq: Four times a day (QID) | ORAL | 0 refills | Status: DC | PRN

## 2017-11-06 MED ORDER — HYDROCODONE-ACETAMINOPHEN 7.5-325 MG PO TABS: 1.0000 | ORAL_TABLET | ORAL | 0 refills | Status: DC | PRN

## 2017-11-06 MED ORDER — APIXABAN 5 MG PO TABS: 5.0000 mg | ORAL_TABLET | Freq: Two times a day (BID) | ORAL | 0 refills | Status: DC

## 2017-11-06 NOTE — Progress Notes (Signed)
Physical Therapy Treatment Patient Details Name: [name] MRN: [id number] DOB: [birth year] Today's Date: 11/06/2017    History of Present Illness Pt is a 77 year old female s/p L TKA with hx of DM, PVD, spinal cord stimulator, R TKA    PT Comments    Pt ambulated again and practiced safe stair technique.  Pt reports understanding and feels ready for d/c home today.  Provided HEP and verbally reviewed with pt.   Follow Up Recommendations  Outpatient PT;Follow surgeon's recommendation for DC plan and follow-up therapies     Equipment Recommendations  None recommended by PT    Recommendations for Other Services       Precautions / Restrictions Precautions Precautions: Knee Restrictions Other Position/Activity Restrictions: WBAT    Mobility  Bed Mobility Overal bed mobility: Needs Assistance Bed Mobility: Supine to Sit     Supine to sit: Supervision     General bed mobility comments: pt sitting EOB on arrival  Transfers Overall transfer level: Needs assistance Equipment used: Rolling walker (2 wheeled) Transfers: Sit to/from Stand Sit to Stand: Min guard         General transfer comment: verbal cues for UE and LE positioning, increased time and effortful however min/guard for safety  Ambulation/Gait Ambulation/Gait assistance: Min guard Ambulation Distance (Feet): 100 Feet Assistive device: Rolling walker (2 wheeled) Gait Pattern/deviations: Step-to pattern;Decreased stance time - left;Antalgic     General Gait Details: verbal cues for sequence, step length   Stairs Stairs: Yes   Stair Management: Step to pattern;Forwards;Two rails Number of Stairs: 2 General stair comments: verbal cues for safe technique, pt reports understanding  Wheelchair Mobility    Modified Rankin (Stroke Patients Only)       Balance                                            Cognition Arousal/Alertness: Awake/alert Behavior During Therapy: WFL  for tasks assessed/performed Overall Cognitive Status: Within Functional Limits for tasks assessed                                        Exercises     General Comments        Pertinent Vitals/Pain Pain Assessment: 0-10 Pain Score: 5  Pain Location: L knee with ambulation Pain Descriptors / Indicators: Sore Pain Intervention(s): Limited activity within patient's tolerance;Repositioned;Monitored during session    Home Living                      Prior Function            PT Goals (current goals can now be found in the care plan section) Progress towards PT goals: Progressing toward goals    Frequency    7X/week      PT Plan Current plan remains appropriate    Co-evaluation              AM-PAC PT "6 Clicks" Daily Activity  Outcome Measure  Difficulty turning over in bed (including adjusting bedclothes, sheets and blankets)?: A Little Difficulty moving from lying on back to sitting on the side of the bed? : A Little Difficulty sitting down on and standing up from a chair with arms (e.g., wheelchair, bedside commode, etc,.)?: A Little Help needed  moving to and from a bed to chair (including a wheelchair)?: A Little Help needed walking in hospital room?: A Little Help needed climbing 3-5 steps with a railing? : A Little 6 Click Score: 18    End of Session   Activity Tolerance: Patient tolerated treatment well Patient left: with call bell/phone within reach;with family/visitor present;in bed Nurse Communication: Mobility status PT Visit Diagnosis: Other abnormalities of gait and mobility (R26.89)     Time: 4268-3419 PT Time Calculation (min) (ACUTE ONLY): 13 min  Charges:  $Gait Training: 8-22 mins                    G Codes:       Carmelia Bake, PT, DPT 11/06/2017 Pager: [phone]  York Ram E 11/06/2017, 1:37 PM

## 2017-11-06 NOTE — Progress Notes (Signed)
Patient discharged to home with family. Given all belongings, instructions, prescriptions. Patient and family verbalized understanding of all instructions. Escorted to pov via w/c.

## 2017-11-06 NOTE — Progress Notes (Signed)
Patient ID: [name], female   DOB: Jun 10, 1941, 77 y.o.   MRN: [id number] Subjective: 1 Day Post-Op Procedure(s) (LRB): LEFT TOTAL KNEE ARTHROPLASTY (Left)    Patient reports pain as mild to moderate.  No events other than limited sleep.  Blood sugar greater than 200 this am  Objective:   VITALS:   Vitals:   11/06/17 0138 11/06/17 0650  BP: (!) 122/56 (!) 130/51  Pulse: 88 77  Resp: 15 17  Temp: 98.2 F (36.8 C) 98 F (36.7 C)  SpO2: 100% 98%    Neurovascular intact Incision: dressing C/D/I  LABS Recent Labs    11/06/17 0556  HGB 9.6*  HCT 29.4*  WBC 9.9  PLT 206    Recent Labs    11/06/17 0556  NA 138  K 4.3  BUN 27*  CREATININE 1.00  GLUCOSE 263*    No results for input(s): LABPT, INR in the last 72 hours.   Assessment/Plan: 1 Day Post-Op Procedure(s) (LRB): LEFT TOTAL KNEE ARTHROPLASTY (Left)   Advance diet Up with therapy  Home today after therapy Reviewed goals as she has had her right knee replaced in past RTC in 2 weeks for dressing removal wound check and to assess status outpt PT already set up

## 2017-11-06 NOTE — Progress Notes (Signed)
Discharge planning, no HH needs identified. Plan for OP PT, has DME. [phone] 

## 2017-11-06 NOTE — Progress Notes (Signed)
Physical Therapy Treatment Patient Details Name: [name] MRN: [id number] DOB: 1941/06/26 Today's Date: 11/06/2017    History of Present Illness Pt is a 77 year old female s/p L TKA with hx of DM, PVD, spinal cord stimulator, R TKA    PT Comments    Pt ambulated in hallway and performed LE exercises.  Pt reports pain present however she is tolerating mobility well and anticipates d/c home after second session.  Will return to practice steps prior to d/c.   Follow Up Recommendations  Outpatient PT;Follow surgeon's recommendation for DC plan and follow-up therapies     Equipment Recommendations  None recommended by PT    Recommendations for Other Services       Precautions / Restrictions Precautions Precautions: Knee Restrictions Other Position/Activity Restrictions: WBAT    Mobility  Bed Mobility               General bed mobility comments: pt sitting EOB on arrival  Transfers Overall transfer level: Needs assistance Equipment used: Rolling walker (2 wheeled) Transfers: Sit to/from Stand Sit to Stand: Min guard         General transfer comment: verbal cues for UE and LE positioning, increased time and effortful however min/guard for safety  Ambulation/Gait Ambulation/Gait assistance: Min guard Ambulation Distance (Feet): 100 Feet Assistive device: Rolling walker (2 wheeled) Gait Pattern/deviations: Step-to pattern;Decreased stance time - left;Antalgic     General Gait Details: verbal cues for sequence, step length, posture, distance to tolerance   Stairs            Wheelchair Mobility    Modified Rankin (Stroke Patients Only)       Balance                                            Cognition Arousal/Alertness: Awake/alert Behavior During Therapy: WFL for tasks assessed/performed Overall Cognitive Status: Within Functional Limits for tasks assessed                                         Exercises Total Joint Exercises Ankle Circles/Pumps: AROM;Both;10 reps Quad Sets: AROM;10 reps;Left Short Arc Quad: AROM;10 reps;Left Heel Slides: AAROM;10 reps;Left Hip ABduction/ADduction: AROM;10 reps;Left Straight Leg Raises: AROM;10 reps;Left Goniometric ROM: approximately 80* AAROM L knee flexion    General Comments        Pertinent Vitals/Pain Pain Assessment: 0-10 Pain Score: 7  Pain Location: L knee with ambulation Pain Descriptors / Indicators: Sore Pain Intervention(s): Limited activity within patient's tolerance;Monitored during session;Repositioned;Ice applied    Home Living                      Prior Function            PT Goals (current goals can now be found in the care plan section) Progress towards PT goals: Progressing toward goals    Frequency    7X/week      PT Plan Current plan remains appropriate    Co-evaluation              AM-PAC PT "6 Clicks" Daily Activity  Outcome Measure  Difficulty turning over in bed (including adjusting bedclothes, sheets and blankets)?: A Little Difficulty moving from lying on back to sitting on the side of  the bed? : A Lot Difficulty sitting down on and standing up from a chair with arms (e.g., wheelchair, bedside commode, etc,.)?: A Lot Help needed moving to and from a bed to chair (including a wheelchair)?: A Little Help needed walking in hospital room?: A Little Help needed climbing 3-5 steps with a railing? : A Lot 6 Click Score: 15    End of Session   Activity Tolerance: Patient tolerated treatment well Patient left: in chair;with chair alarm set;with call bell/phone within reach;with family/visitor present   PT Visit Diagnosis: Other abnormalities of gait and mobility (R26.89)     Time: 5859-2924 PT Time Calculation (min) (ACUTE ONLY): 18 min  Charges:  $Therapeutic Exercise: 8-22 mins                    G Codes:       Carmelia Bake, PT, DPT 11/06/2017 Pager:  [phone]   York Ram E 11/06/2017, 12:30 PM

## 2017-11-13 NOTE — Discharge Summary (Signed)
Physician Discharge Summary  Patient ID: [name] MRN: [id number] DOB/AGE: Sep 20, 1940 77 y.o.  Admit date: 11/05/2017 Discharge date: 11/06/2017   Procedures:  Procedure(s) (LRB): LEFT TOTAL KNEE ARTHROPLASTY (Left)  Attending Physician:  Dr. Paralee Cancel   Admission Diagnoses:   Left knee primary OA / pain  Discharge Diagnoses:  Principal Problem:   S/P left TKA Active Problems:   S/P total knee replacement  Past Medical History:  Diagnosis Date  . Acute GI bleeding 05/13/2015  . Anxiety   . Arthritis   . Asthma   . Atrial fibrillation ([place]) 10/29/2017  . Bakers cyst, left   . Chronic kidney disease    stage 3  . Constipation   . Diabetes mellitus    type 2  . GERD (gastroesophageal reflux disease)   . Heart murmur   . History of cellulitis   . History of dizziness   . History of hiatal hernia    tiny 06/15/2016  . Hypertension   . Iron deficiency anemia   . Peripheral vascular disease ([place])   . PONV (postoperative nausea and vomiting)   . Pulmonary nodule     HPI:    [name], 77 y.o. female, has a history of pain and functional disability in the left knee due to arthritis and has failed non-surgical conservative treatments for greater than 12 weeks to include NSAID's and/or analgesics, corticosteriod injections, viscosupplementation injections and activity modification.  Onset of symptoms was gradual, starting 1+ years ago with gradually worsening course since that time. The patient noted prior procedures on the knee to include  arthroplasty on the right knee 13 yrs ago.  Patient currently rates pain in the left knee(s) at 10 out of 10 with activity. Patient has night pain, worsening of pain with activity and weight bearing, pain that interferes with activities of daily living, pain with passive range of motion, crepitus and joint swelling.  Patient has evidence of periarticular osteophytes and joint space narrowing by imaging studies.  There is no  active infection.  Risks, benefits and expectations were discussed with the patient.  Risks including but not limited to the risk of anesthesia, blood clots, nerve damage, blood vessel damage, failure of the prosthesis, infection and up to and including death.  Patient understand the risks, benefits and expectations and wishes to proceed with surgery.   PCP: Leighton Ruff, MD   Discharged Condition: good  Hospital Course:  Patient underwent the above stated procedure on 11/05/2017. Patient tolerated the procedure well and brought to the recovery room in good condition and subsequently to the floor.  POD #1 BP: 130/51 ; Pulse: 77 ; Temp: 98 F (36.7 C) ; Resp: 17 Patient reports pain as mild to moderate.  No events other than limited sleep. Blood sugar greater than 200 this am. Neurovascular intact and incision: dressing C/D/I.   LABS  Basename    HGB     9.6  HCT     29.4    Discharge Exam: General appearance: alert, cooperative and no distress Extremities: Homans sign is negative, no sign of DVT, no edema, redness or tenderness in the calves or thighs and no ulcers, gangrene or trophic changes  Disposition:  Home with follow up in 2 weeks   Follow-up Information    Paralee Cancel, MD. Schedule an appointment as soon as possible for a visit in 2 week(s).   Specialty:  Orthopedic Surgery Contact information: [address] [place] [address] [phone]  Discharge Instructions    Call MD / Call 911   Complete by:  As directed    If you experience chest pain or shortness of breath, CALL 911 and be transported to the hospital emergency room.  If you develope a fever above 101 F, pus (white drainage) or increased drainage or redness at the wound, or calf pain, call your surgeon's office.   Change dressing   Complete by:  As directed    Maintain surgical dressing until follow up in the clinic. If the edges start to pull up, may reinforce with  tape. If the dressing is no longer working, may remove and cover with gauze and tape, but must keep the area dry and clean.  Call with any questions or concerns.   Constipation Prevention   Complete by:  As directed    Drink plenty of fluids.  Prune juice may be helpful.  You may use a stool softener, such as Colace (over the counter) 100 mg twice a day.  Use MiraLax (over the counter) for constipation as needed.   Diet - low sodium heart healthy   Complete by:  As directed    Discharge instructions   Complete by:  As directed    Maintain surgical dressing until follow up in the clinic. If the edges start to pull up, may reinforce with tape. If the dressing is no longer working, may remove and cover with gauze and tape, but must keep the area dry and clean.  Follow up in 2 weeks at [facility]. Call with any questions or concerns.   Increase activity slowly as tolerated   Complete by:  As directed    Weight bearing as tolerated with assist device (walker, cane, etc) as directed, use it as long as suggested by your surgeon or therapist, typically at least 4-6 weeks.   TED hose   Complete by:  As directed    Use stockings (TED hose) for 2 weeks on both leg(s).  You may remove them at night for sleeping.      Allergies as of 11/06/2017      Reactions   Vasotec Shortness Of Breath, Other (See Comments)   wheezing   Atorvastatin Other (See Comments)   Leg cramps   Codeine Nausea And Vomiting   Cymbalta [duloxetine Hcl] Itching   Lansoprazole Itching, Swelling, Rash, Other (See Comments)   Redness, swelling of mouth    Morphine And Related Itching   Sulfate Itching   Adhesive [tape] Rash   Scopolamine Rash      Medication List    STOP taking these medications   acetaminophen 650 MG CR tablet Commonly known as:  TYLENOL   budesonide-formoterol 160-4.5 MCG/ACT inhaler Commonly known as:  SYMBICORT   FLUoxetine 10 MG capsule Commonly known as:  PROZAC   ondansetron 4  MG disintegrating tablet Commonly known as:  ZOFRAN ODT     TAKE these medications   albuterol (2.5 MG/3ML) 0.083% nebulizer solution Commonly known as:  PROVENTIL Take 3 mLs (2.5 mg total) by nebulization every 6 (six) hours as needed for wheezing or shortness of breath.   albuterol 108 (90 Base) MCG/ACT inhaler Commonly known as:  PROAIR HFA Inhale 2 puffs into the lungs every 4 (four) hours as needed for wheezing or shortness of breath.   ALPRAZolam 0.25 MG tablet Commonly known as:  XANAX Take 0.25 mg by mouth daily as needed for anxiety.   amitriptyline 50 MG tablet Commonly known as:  ELAVIL Take  50 mg by mouth at bedtime.   amLODipine 10 MG tablet Commonly known as:  NORVASC Take 10 mg by mouth daily.   apixaban 5 MG Tabs tablet Commonly known as:  ELIQUIS Take 1 tablet (5 mg total) by mouth 2 (two) times daily.   docusate sodium 100 MG capsule Commonly known as:  COLACE Take 1 capsule (100 mg total) by mouth 2 (two) times daily.   esomeprazole 40 MG capsule Commonly known as:  NEXIUM Take 40 mg by mouth daily.   ferrous sulfate 325 (65 FE) MG tablet Commonly known as:  FERROUSUL Take 1 tablet (325 mg total) by mouth 3 (three) times daily with meals.   gabapentin 300 MG capsule Commonly known as:  NEURONTIN Take 600 mg by mouth 3 (three) times daily.   HUMALOG 100 UNIT/ML cartridge Generic drug:  insulin lispro Inject 22-28 Units into the skin 3 (three) times daily with meals. Sliding scale 18 units at breakfast, 25 units at lunch and 28 units at dinner   HYDROcodone-acetaminophen 7.5-325 MG tablet Commonly known as:  NORCO Take 1-2 tablets by mouth every 4 (four) hours as needed for moderate pain.   LANTUS SOLOSTAR [place] Inject 55 Units into the skin at bedtime.   LIVALO 1 MG Tabs Generic drug:  Pitavastatin Calcium Take 1 mg by mouth at bedtime.   losartan 100 MG tablet Commonly known as:  COZAAR Take 100 mg by mouth daily.   methocarbamol 500 MG  tablet Commonly known as:  ROBAXIN Take 1 tablet (500 mg total) by mouth every 6 (six) hours as needed for muscle spasms.   metoprolol tartrate 50 MG tablet Commonly known as:  LOPRESSOR Take by mouth 2 (two) times daily. 100 mg q AM and 75 q PM   polyethylene glycol packet Commonly known as:  MIRALAX / GLYCOLAX Take 17 g by mouth 2 (two) times daily.   PRESERVISION AREDS 2 PO Take 1 tablet by mouth 2 (two) times daily.   triamterene-hydrochlorothiazide 37.5-25 MG capsule Commonly known as:  DYAZIDE Take 2 capsules by mouth daily after lunch.   venlafaxine XR 37.5 MG 24 hr capsule Commonly known as:  EFFEXOR-XR Take 37.5 mg by mouth daily with breakfast.            Discharge Care Instructions  (From admission, onward)        Start     Ordered   11/06/17 0000  Change dressing    Comments:  Maintain surgical dressing until follow up in the clinic. If the edges start to pull up, may reinforce with tape. If the dressing is no longer working, may remove and cover with gauze and tape, but must keep the area dry and clean.  Call with any questions or concerns.   11/06/17 0933       Signed: West Pugh. Sherre Wooton   PA-C  11/13/2017, 7:47 AM

## 2017-12-31 ENCOUNTER — Other Ambulatory Visit: Payer: Self-pay | Admitting: Family Medicine

## 2017-12-31 DIAGNOSIS — Z1231 Encounter for screening mammogram for malignant neoplasm of breast: Secondary | ICD-10-CM

## 2018-01-23 ENCOUNTER — Ambulatory Visit
Admission: RE | Admit: 2018-01-23 | Discharge: 2018-01-23 | Disposition: A | Discharge status: Home / Self Care | Payer: Medicare Other | Source: Ambulatory Visit | Attending: Family Medicine | Admitting: Family Medicine

## 2018-01-23 ENCOUNTER — Ambulatory Visit: Payer: Medicare Other

## 2018-01-23 DIAGNOSIS — Z1231 Encounter for screening mammogram for malignant neoplasm of breast: Secondary | ICD-10-CM

## 2018-04-25 DIAGNOSIS — R2689 Other abnormalities of gait and mobility: Secondary | ICD-10-CM | POA: Insufficient documentation

## 2018-04-26 ENCOUNTER — Encounter: Payer: Self-pay | Admitting: Podiatry

## 2018-04-26 ENCOUNTER — Ambulatory Visit (INDEPENDENT_AMBULATORY_CARE_PROVIDER_SITE_OTHER): Payer: Medicare Other | Admitting: Podiatry

## 2018-04-26 DIAGNOSIS — M79674 Pain in right toe(s): Secondary | ICD-10-CM | POA: Diagnosis not present

## 2018-04-26 DIAGNOSIS — M79675 Pain in left toe(s): Secondary | ICD-10-CM

## 2018-04-26 DIAGNOSIS — E1142 Type 2 diabetes mellitus with diabetic polyneuropathy: Secondary | ICD-10-CM

## 2018-04-26 DIAGNOSIS — R601 Generalized edema: Secondary | ICD-10-CM

## 2018-04-26 DIAGNOSIS — B351 Tinea unguium: Secondary | ICD-10-CM | POA: Diagnosis not present

## 2018-04-26 NOTE — Patient Instructions (Signed)

## 2018-04-30 ENCOUNTER — Encounter: Payer: Self-pay | Admitting: Podiatry

## 2018-04-30 NOTE — Progress Notes (Signed)
Subjective: [name] presents today with diabetes and cc of painful, discolored, thick toenails which interfere with daily activities and routine tasks. Duration greater than one month. Pain is aggravated when wearing enclosed shoe gear.   She denies any history of foot wounds or amputations.  She does have subjective neuropathy symptoms, hypersensitivity to touch, tingling.  Her blood sugar this morning was 85 mg/dL; last A1c was 6.5 five months ago.   Medical History   10/29/2017 Atrial fibrillation ([place])  05/13/2015 Acute GI bleeding  Date Unknown Anxiety  Date Unknown Arthritis  Date Unknown Asthma  Date Unknown Bakers cyst, left  Date Unknown Chronic kidney disease   Date Unknown Constipation  Date Unknown Diabetes mellitus   Date Unknown GERD (gastroesophageal reflux disease)  Date Unknown Heart murmur  Date Unknown History of cellulitis  Date Unknown History of dizziness  Date Unknown History of hiatal hernia   Date Unknown Hypertension  Date Unknown Iron deficiency anemia  Date Unknown Peripheral vascular disease ([place])  Date Unknown PONV (postoperative nausea and vomiting)  Date Unknown Pulmonary nodule   Problem List   New problems from outside sources are available for reconciliation  Cardiovascular and Mediastinum   HTN (hypertension)  Respiratory   Asthma, chronic   Hypoxia   Allergic rhinitis  Digestive   Acute GI bleeding  Endocrine   DM type 2 with diabetic peripheral neuropathy ([place])  Other   Depression   Flu-like symptoms   Acute blood loss anemia   Normocytic anemia   Iron deficiency anemia   S/P left TKA   S/P total knee replacement   Surgical History   11/05/2017 Total knee arthroplasty (Left)   06/15/2016 Esophagogastroduodenoscopy (egd) with propofol (N/A)   09/09/2015 Spinal cord stimulator battery exchange (N/A)   05/15/2013 Colonoscopy w/ polypectomy  2010 Hand surgery (Left)   1977 Vaginal hysterectomy   1952  Appendectomy  Date Unknown Eye surgery  Date Unknown Incontinence surgery  Date Unknown Joint replacement (Right)   2003 2005 Knee arthroscopy   Date Unknown mass fallopian tube [Other]   Medications   New medications from outside sources are available for reconciliation   albuterol (PROAIR HFA) 108 (90 BASE) MCG/ACT inhaler    albuterol (PROVENTIL) (2.5 MG/3ML) 0.083% nebulizer solution    ALPRAZolam (XANAX) 0.25 MG tablet    amitriptyline (ELAVIL) 50 MG tablet    amLODipine (NORVASC) 10 MG tablet    apixaban (ELIQUIS) 5 MG TABS tablet    docusate sodium (COLACE) 100 MG capsule    esomeprazole (NEXIUM) 40 MG capsule    ferrous sulfate (FERROUSUL) 325 (65 FE) MG tablet    gabapentin (NEURONTIN) 300 MG capsule    Insulin Glargine (LANTUS SOLOSTAR [place])    insulin lispro (HUMALOG) 100 UNIT/ML SOCT    losartan (COZAAR) 100 MG tablet    methocarbamol (ROBAXIN) 500 MG tablet    metoprolol (LOPRESSOR) 50 MG tablet    Multiple Vitamins-Minerals (PRESERVISION AREDS 2 PO)    Pitavastatin Calcium (LIVALO) 1 MG TABS    polyethylene glycol (MIRALAX / GLYCOLAX) packet    triamterene-hydrochlorothiazide (DYAZIDE) 37.5-25 MG per capsule    venlafaxine XR (EFFEXOR-XR) 37.5 MG 24 hr capsule    HYDROcodone-acetaminophen (NORCO) 7.5-325 MG tablet    Allergies     VasotecShortness Of Breath, Other (See Comments)  AtorvastatinOther (See Comments)  CodeineNausea And Vomiting  Cymbalta [Duloxetine Hcl]Itching  LansoprazoleItching, Swelling, Rash, Other (See Comments)  Morphine And RelatedItching  SulfateItching  Adhesive [Tape]Rash  ScopolamineRash  Tobacco History   Smoking Status  Never Smoker  Smokeless Tobacco Status  Never Used   Family History    Mother (Deceased) Lung cancer         Father (Deceased) Heart attack            Neg Hx Colon cancer    Stroke    Migraines    ROS: Per HPI unless specifically indicated in ROS section   Objective: Vascular Examination: Capillary  refill time immediate x 10 digits Dorsalis pedis and posterior tibial pulses present b/l No digital hair x 10 digits Skin temperature warm to warm b/l  Dermatological Examination: Skin thin and atrophic b/l Toenails 1-5 b/l discolored, thick, dystrophic with subungual debris and pain with palpation to nailbeds due to thickness of nails.  Musculoskeletal: Muscle strength 5/5 to all LE muscle groups  Neurological: Sensation intact with 10 gram monofilament. Vibratory sensation intact.  Assessment: Painful onychomycosis toenails 1-5 b/l  NIDDM with diabetic neuropathy  Plan: 1. Patient examined. 2. For edema, recommended thigh high compression hose 15-20 mmHg. 3. Discussed diabetic foot care principles. Literature dispensed. 4. Toenails 1-5 b/l were debrided in length and girth without iatrogenic bleeding. 5. Patient to continue soft, supportive shoe gear 6. Patient to report any pedal injuries to medical professional immediately. 7. Follow up 3 months. Patient/POA to call should there be a concern in the interim.

## 2018-05-01 DIAGNOSIS — M6281 Muscle weakness (generalized): Secondary | ICD-10-CM | POA: Insufficient documentation

## 2018-06-05 ENCOUNTER — Telehealth: Payer: Self-pay | Admitting: Cardiology

## 2018-06-05 NOTE — Telephone Encounter (Signed)
Tried to contact patient, no actual answer

## 2018-06-05 NOTE — Telephone Encounter (Signed)
New message   Patient would like her records to be transferred to [place] office. Please contact patient with any questions.

## 2018-06-06 NOTE — Telephone Encounter (Signed)
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 Please call

## 2018-06-06 NOTE — Telephone Encounter (Signed)
Spoke with [name] she has scheduled an Appointment with Dr. Marlou Porch. I advised that she should be able to contact Dr. Thurman Coyer and have them fax her records over.

## 2018-06-12 ENCOUNTER — Ambulatory Visit (INDEPENDENT_AMBULATORY_CARE_PROVIDER_SITE_OTHER): Payer: Medicare Other | Admitting: Cardiology

## 2018-06-12 ENCOUNTER — Encounter: Payer: Self-pay | Admitting: Cardiology

## 2018-06-12 ENCOUNTER — Encounter: Payer: Self-pay | Admitting: *Deleted

## 2018-06-12 VITALS — BP 142/70 | HR 71 | Ht 64.0 in | Wt 164.8 lb

## 2018-06-12 DIAGNOSIS — Z01810 Encounter for preprocedural cardiovascular examination: Secondary | ICD-10-CM

## 2018-06-12 DIAGNOSIS — I480 Paroxysmal atrial fibrillation: Secondary | ICD-10-CM | POA: Diagnosis not present

## 2018-06-12 DIAGNOSIS — E782 Mixed hyperlipidemia: Secondary | ICD-10-CM

## 2018-06-12 DIAGNOSIS — I10 Essential (primary) hypertension: Secondary | ICD-10-CM

## 2018-06-12 NOTE — Patient Instructions (Addendum)
Medication Instructions:  Your physician recommends that you continue on your current medications as directed. Please refer to the Current Medication list given to you today.  If you need a refill on your cardiac medications before your next appointment, please call your pharmacy.   Lab work: none If you have labs (blood work) drawn today and your tests are completely normal, you will receive your results only by: Marland Kitchen MyChart Message (if you have MyChart) OR . A paper copy in the mail If you have any lab test that is abnormal or we need to change your treatment, we will call you to review the results.  Testing/Procedures: none  Follow-Up: At [facility], you and your health needs are our priority.  As part of our continuing mission to provide you with exceptional heart care, we have created designated Provider Care Teams.  These Care Teams include your primary Cardiologist (physician) and Advanced Practice Providers (APPs -  Physician Assistants and Nurse Practitioners) who all work together to provide you with the care you need, when you need it. You will need a follow up appointment in 12 months.  Please call our office 2 months in advance to schedule this appointment.  You may see Candee Furbish, MD or one of the following Advanced Practice Providers on your designated Care Team:   Truitt Merle, NP Cecilie Kicks, NP . Kathyrn Drown, NP  Any Other Special Instructions Will Be Listed Below (If Applicable). It is ok to proceed with colonoscopy, with Dr. Watt Climes. Hold eliquis for 2 days before procedure.  Addendum: Letter sent via Epic fax to Dr. Watt Climes informing that patient is okay to proceed with colonoscopy and that she is to hold Eliquis for 2 days prior to procedure per Dr. Marlou Porch.

## 2018-06-12 NOTE — Progress Notes (Signed)
Cardiology Office Note:    Date:  06/12/2018   ID:  [name], DOB [birth year], MRN [id number]  PCP:  Leighton Ruff, MD  Cardiologist:  Candee Furbish, MD  Electrophysiologist:  None   Referring MD: Leighton Ruff, MD     History of Present Illness:    [name] is a 77 y.o. female here for preoperative risk evaluation at the request of Dr. Watt Climes and Dr. Drema Dallas.  Prior patient of Dr. Thurman Coyer.  She has a history of paroxysmal atrial fibrillation, hyperlipidemia, diabetes with neuropathy.  Has been taking Eliquis twice a day as well as losartan metoprolol amlodipine.  No complaints.  Blood pressures been under good control.  She brought an EKG from 10/23/2017 which was personally reviewed that showed artifact but appears to be sinus rhythm 75.  EGD - PUD bleeding 2 years ago, [place]. Colonoscopy. 5 year.   Her husband had MI, taken care of by Dr. Burt Knack.  Past Medical History:  Diagnosis Date  . Acute GI bleeding 05/13/2015  . Anxiety   . Arthritis   . Asthma   . Atrial fibrillation ([place]) 10/29/2017  . Bakers cyst, left   . Chronic kidney disease    stage 3  . Constipation   . Diabetes mellitus    type 2  . GERD (gastroesophageal reflux disease)   . Heart murmur   . History of cellulitis   . History of dizziness   . History of hiatal hernia    tiny 06/15/2016  . Hypertension   . Iron deficiency anemia   . Peripheral vascular disease ([place])   . PONV (postoperative nausea and vomiting)   . Pulmonary nodule     Past Surgical History:  Procedure Laterality Date  . APPENDECTOMY  1952  . COLONOSCOPY W/ POLYPECTOMY  05/15/2013  . ESOPHAGOGASTRODUODENOSCOPY (EGD) WITH PROPOFOL N/A 06/15/2016   Procedure: ESOPHAGOGASTRODUODENOSCOPY (EGD) WITH PROPOFOL;  Surgeon: Clarene Essex, MD;  Location: [facility];  Service: Endoscopy;  Laterality: N/A;  . EYE SURGERY    . HAND SURGERY Left 2010   operated on index and ring finger  . INCONTINENCE SURGERY    . JOINT  REPLACEMENT Right    knee  . KNEE ARTHROSCOPY  2003 2005   right knee  . mass fallopian tube    . SPINAL CORD STIMULATOR BATTERY EXCHANGE N/A 09/09/2015   Procedure: SPINAL CORD STIMULATOR BATTERY EXCHANGE;  Surgeon: Melina Schools, MD;  Location: [facility];  Service: Orthopedics;  Laterality: N/A;  . TOTAL KNEE ARTHROPLASTY Left 11/05/2017   Procedure: LEFT TOTAL KNEE ARTHROPLASTY;  Surgeon: Paralee Cancel, MD;  Location: [facility];  Service: Orthopedics;  Laterality: Left;  Adductor Block  . VAGINAL HYSTERECTOMY  1977   1 ovary removed    Current Medications: Current Meds  Medication Sig  . albuterol (PROAIR HFA) 108 (90 BASE) MCG/ACT inhaler Inhale 2 puffs into the lungs every 4 (four) hours as needed for wheezing or shortness of breath.  Marland Kitchen albuterol (PROVENTIL) (2.5 MG/3ML) 0.083% nebulizer solution Take 3 mLs (2.5 mg total) by nebulization every 6 (six) hours as needed for wheezing or shortness of breath.  . ALPRAZolam (XANAX) 0.25 MG tablet Take 0.25 mg by mouth daily as needed for anxiety.  Marland Kitchen amitriptyline (ELAVIL) 50 MG tablet Take 50 mg by mouth at bedtime.  Marland Kitchen amLODipine (NORVASC) 10 MG tablet Take 10 mg by mouth daily.  Marland Kitchen apixaban (ELIQUIS) 5 MG TABS tablet Take 1 tablet (5 mg total) by mouth 2 (two) times  daily.  . busPIRone (BUSPAR) 5 MG tablet Take 2 tablets by mouth daily.  . clobetasol cream (TEMOVATE) 1.61 % Apply 1 application topically daily.  Marland Kitchen docusate sodium (COLACE) 100 MG capsule Take 1 capsule (100 mg total) by mouth 2 (two) times daily.  Marland Kitchen esomeprazole (NEXIUM) 40 MG capsule Take 40 mg by mouth daily.  Marland Kitchen gabapentin (NEURONTIN) 300 MG capsule Take 600 mg by mouth 3 (three) times daily.   . Insulin Glargine (LANTUS SOLOSTAR [place]) Inject 55 Units into the skin at bedtime.   . insulin lispro (HUMALOG) 100 UNIT/ML SOCT Inject 22-28 Units into the skin 3 (three) times daily with meals. Sliding scale 18 units at breakfast, 25 units at lunch and 28 units at dinner  . losartan  (COZAAR) 100 MG tablet Take 100 mg by mouth daily.  . metoprolol (LOPRESSOR) 50 MG tablet Take by mouth 2 (two) times daily. 100 mg q AM and 75 q PM  . Multiple Vitamins-Minerals (PRESERVISION AREDS 2 PO) Take 1 tablet by mouth 2 (two) times daily.   . Pitavastatin Calcium (LIVALO) 1 MG TABS Take 1 mg by mouth at bedtime.  . polyethylene glycol (MIRALAX / GLYCOLAX) packet Take 17 g by mouth 2 (two) times daily.  Marland Kitchen triamterene-hydrochlorothiazide (DYAZIDE) 37.5-25 MG per capsule Take 2 capsules by mouth daily after lunch.   . venlafaxine XR (EFFEXOR-XR) 37.5 MG 24 hr capsule Take 37.5 mg by mouth daily with breakfast.     Allergies:   Vasotec; Atorvastatin; Codeine; Cymbalta [duloxetine hcl]; Lansoprazole; Morphine and related; Sulfate; Adhesive [tape]; and Scopolamine   Social History   Socioeconomic History  . Marital status: Married    Spouse name: Charlotte Crumb  . Number of children: 2  . Years of education: 43  . Highest education level: Not on file  Occupational History    Comment: retired Pharmacist, hospital, IT sales professional  Social Needs  . Financial resource strain: Not on file  . Food insecurity:    Worry: Not on file    Inability: Not on file  . Transportation needs:    Medical: Not on file    Non-medical: Not on file  Tobacco Use  . Smoking status: Never Smoker  . Smokeless tobacco: Never Used  Substance and Sexual Activity  . Alcohol use: No  . Drug use: No  . Sexual activity: Never    Birth control/protection: Surgical  Lifestyle  . Physical activity:    Days per week: Not on file    Minutes per session: Not on file  . Stress: Not on file  Relationships  . Social connections:    Talks on phone: Not on file    Gets together: Not on file    Attends religious service: Not on file    Active member of club or organization: Not on file    Attends meetings of clubs or organizations: Not on file    Relationship status: Not on file  Other Topics Concern  . Not on file  Social  History Narrative   Lives with husband   Caffeine use- none     Family History: The patient's family history includes Heart attack in her father; Lung cancer in her mother. There is no history of Colon cancer, Stroke, or Migraines.  ROS:   Please see the history of present illness.     All other systems reviewed and are negative.  EKGs/Labs/Other Studies Reviewed:    The following studies were reviewed today: Office notes, lab work, prior EKG reviewed  EKG:  EKG is not ordered today.  The ekg ordered today demonstrates previous one described above, sinus rhythm 09/2017, and 10/2017  Recent Labs: 10/30/2017: TSH 1.008 11/06/2017: BUN 27; Creatinine, Ser 1.00; Hemoglobin 9.6; Platelets 206; Potassium 4.3; Sodium 138  Recent Lipid Panel No results found for: CHOL, TRIG, HDL, CHOLHDL, VLDL, LDLCALC, LDLDIRECT  Physical Exam:    VS:  BP (!) 142/70   Pulse 71   Ht 5\' 4"  (1.626 m)   Wt 164 lb 12.8 oz (74.8 kg)   SpO2 98%   BMI 28.29 kg/m     Wt Readings from Last 3 Encounters:  06/12/18 164 lb 12.8 oz (74.8 kg)  11/05/17 162 lb (73.5 kg)  10/30/17 162 lb 8 oz (73.7 kg)     GEN:  Well nourished, well developed in no acute distress HEENT: Normal NECK: No JVD; No carotid bruits LYMPHATICS: No lymphadenopathy CARDIAC: RRR, no murmurs, rubs, gallops RESPIRATORY:  Clear to auscultation without rales, wheezing or rhonchi  ABDOMEN: Soft, non-tender, non-distended MUSCULOSKELETAL:  No edema; No deformity  SKIN: Warm and dry NEUROLOGIC:  Alert and oriented x 3 PSYCHIATRIC:  Normal affect   ASSESSMENT:    1. Preop cardiovascular exam   2. Paroxysmal atrial fibrillation (HCC)   3. Essential hypertension   4. Mixed hyperlipidemia    PLAN:    In order of problems listed above:  Preoperative cardiovascular risk evaluation -She may proceed with colonoscopy, Dr. Watt Climes, holding her Eliquis for 2 days prior to procedure, normal renal function.  She is of low overall cardiac  risk.  Paroxysmal atrial fibrillation - Continue with current plan.  No changes made.  Eliquis.  No bleeding.  Prior EKGs from February and March 2019 reviewed, sinus rhythm.  States that Dr. Wynonia Lawman diagnosed her with atrial fibrillation previously.  History of peptic ulcer bleeding 2017 -Dr. Watt Climes, laser treatment  Essential hypertension - On amlodipine, could be contributing somewhat to her minor ankle edema.  She has tried compression hose but did not like wearing them.  Caused her knees to swell.  Went to [employer] to get a pair.  Hyperlipidemia -Currently on Pitivastatin.  1 mg.  She had trouble before with other statin therapies.  Leg cramps.   We will see her back in 1 year.  Medication Adjustments/Labs and Tests Ordered: Current medicines are reviewed at length with the patient today.  Concerns regarding medicines are outlined above.  No orders of the defined types were placed in this encounter.  No orders of the defined types were placed in this encounter.   Patient Instructions  Medication Instructions:  Your physician recommends that you continue on your current medications as directed. Please refer to the Current Medication list given to you today.  If you need a refill on your cardiac medications before your next appointment, please call your pharmacy.   Lab work: none If you have labs (blood work) drawn today and your tests are completely normal, you will receive your results only by: Marland Kitchen MyChart Message (if you have MyChart) OR . A paper copy in the mail If you have any lab test that is abnormal or we need to change your treatment, we will call you to review the results.  Testing/Procedures: none  Follow-Up: At [facility], you and your health needs are our priority.  As part of our continuing mission to provide you with exceptional heart care, we have created designated Provider Care Teams.  These Care Teams include your primary Cardiologist (physician) and  Advanced Practice Providers (APPs -  Physician Assistants and Nurse Practitioners) who all work together to provide you with the care you need, when you need it. You will need a follow up appointment in 12 months.  Please call our office 2 months in advance to schedule this appointment.  You may see Candee Furbish, MD or one of the following Advanced Practice Providers on your designated Care Team:   Truitt Merle, NP Cecilie Kicks, NP . Kathyrn Drown, NP  Any Other Special Instructions Will Be Listed Below (If Applicable). It is ok to proceed with colonoscopy, with Dr. Watt Climes. Hold eliquis for 2 days before procedure.      Signed, Candee Furbish, MD  06/12/2018 12:38 PM    [place]

## 2018-07-19 ENCOUNTER — Ambulatory Visit (INDEPENDENT_AMBULATORY_CARE_PROVIDER_SITE_OTHER): Payer: Medicare Other | Admitting: Podiatry

## 2018-07-19 DIAGNOSIS — M79675 Pain in left toe(s): Secondary | ICD-10-CM

## 2018-07-19 DIAGNOSIS — E1142 Type 2 diabetes mellitus with diabetic polyneuropathy: Secondary | ICD-10-CM

## 2018-07-19 DIAGNOSIS — B351 Tinea unguium: Secondary | ICD-10-CM | POA: Diagnosis not present

## 2018-07-19 DIAGNOSIS — M79674 Pain in right toe(s): Secondary | ICD-10-CM

## 2018-07-19 NOTE — Patient Instructions (Signed)

## 2018-08-10 ENCOUNTER — Encounter: Payer: Self-pay | Admitting: Podiatry

## 2018-08-10 NOTE — Progress Notes (Signed)
Subjective: [name] presents today with history of diabetic neuropathy with cc of painful, mycotic toenails.  Pain is aggravated when wearing enclosed shoe gear and relieved with periodic professional debridement.  Patient has peripheral neuropathy managed with gabapentin. She is also on the blood thinner Eliquis.  Leighton Ruff, MD is her PCP.    Current Outpatient Medications:  .  albuterol (PROAIR HFA) 108 (90 BASE) MCG/ACT inhaler, Inhale 2 puffs into the lungs every 4 (four) hours as needed for wheezing or shortness of breath., Disp: 1 Inhaler, Rfl: 3 .  albuterol (PROVENTIL) (2.5 MG/3ML) 0.083% nebulizer solution, Take 3 mLs (2.5 mg total) by nebulization every 6 (six) hours as needed for wheezing or shortness of breath., Disp: 75 mL, Rfl: 12 .  ALPRAZolam (XANAX) 0.25 MG tablet, Take 0.25 mg by mouth daily as needed for anxiety., Disp: , Rfl:  .  amitriptyline (ELAVIL) 50 MG tablet, Take 50 mg by mouth at bedtime., Disp: , Rfl:  .  amLODipine (NORVASC) 10 MG tablet, Take 10 mg by mouth daily., Disp: , Rfl:  .  apixaban (ELIQUIS) 5 MG TABS tablet, Take 1 tablet (5 mg total) by mouth 2 (two) times daily., Disp: 60 tablet, Rfl: 0 .  busPIRone (BUSPAR) 5 MG tablet, Take 2 tablets by mouth daily., Disp: , Rfl:  .  clobetasol cream (TEMOVATE) 8.10 %, Apply 1 application topically daily., Disp: , Rfl: 3 .  docusate sodium (COLACE) 100 MG capsule, Take 1 capsule (100 mg total) by mouth 2 (two) times daily., Disp: 10 capsule, Rfl: 0 .  esomeprazole (NEXIUM) 40 MG capsule, Take 40 mg by mouth daily., Disp: , Rfl: 0 .  gabapentin (NEURONTIN) 300 MG capsule, Take 600 mg by mouth 3 (three) times daily. , Disp: , Rfl:  .  Insulin Glargine (LANTUS SOLOSTAR [place]), Inject 55 Units into the skin at bedtime. , Disp: , Rfl:  .  insulin lispro (HUMALOG) 100 UNIT/ML SOCT, Inject 22-28 Units into the skin 3 (three) times daily with meals. Sliding scale 18 units at breakfast, 25 units at lunch and 28  units at dinner, Disp: , Rfl:  .  losartan (COZAAR) 100 MG tablet, Take 100 mg by mouth daily., Disp: , Rfl:  .  metoprolol (LOPRESSOR) 50 MG tablet, Take by mouth 2 (two) times daily. 100 mg q AM and 75 q PM, Disp: , Rfl:  .  Multiple Vitamins-Minerals (PRESERVISION AREDS 2 PO), Take 1 tablet by mouth 2 (two) times daily. , Disp: , Rfl:  .  Pitavastatin Calcium (LIVALO) 1 MG TABS, Take 1 mg by mouth at bedtime., Disp: , Rfl:  .  polyethylene glycol (MIRALAX / GLYCOLAX) packet, Take 17 g by mouth 2 (two) times daily., Disp: 14 each, Rfl: 0 .  triamterene-hydrochlorothiazide (DYAZIDE) 37.5-25 MG per capsule, Take 2 capsules by mouth daily after lunch. , Disp: , Rfl:  .  venlafaxine XR (EFFEXOR-XR) 37.5 MG 24 hr capsule, Take 37.5 mg by mouth daily with breakfast., Disp: , Rfl:  .  DULoxetine (CYMBALTA) 20 MG capsule, Take by mouth daily., Disp: , Rfl:   Allergies  Allergen Reactions  . Vasotec Shortness Of Breath and Other (See Comments)    wheezing  . Atorvastatin Other (See Comments)    Leg cramps   . Codeine Nausea And Vomiting  . Cymbalta [Duloxetine Hcl] Itching  . Lansoprazole Itching, Swelling, Rash and Other (See Comments)    Redness, swelling of mouth   . Morphine And Related Itching  . Sulfate  Itching  . Adhesive [Tape] Rash  . Scopolamine Rash   ROS:  Neuro: +paresthesias, tingling, burning  Objective:  Vascular Examination: Capillary refill time immediate x 10 digits Dorsalis pedis and Posterior tibial pulses present b/l Digital hair x 10 digits was absent Skin temperature gradient WNL b/l  Dermatological Examination: Skin thin and atrophic b/l  Toenails 1-5 b/l discolored, thick, dystrophic with subungual debris and pain with palpation to nailbeds due to thickness of nails.  Musculoskeletal: Muscle strength 5/5 to all muscle groups b/l  Neurological: Sensation with 10 gram monofilament is intact b/l Vibratory sensation intact b/l  Assessment: 1. Painful  onychomycosis toenails 1-5 b/l 2. NIDDM with neuropathy  Plan: 1. Toenails 1-5 b/l were debrided in length and girth without iatrogenic bleeding. 2. Patient to continue soft, supportive shoe gear 3. Patient to report any pedal injuries to medical professional  4. Follow up 3 months. Patient/POA to call should there be a concern in the interim.

## 2018-10-02 ENCOUNTER — Other Ambulatory Visit: Payer: Self-pay | Admitting: Gastroenterology

## 2018-10-14 ENCOUNTER — Other Ambulatory Visit: Payer: Self-pay | Admitting: Gastroenterology

## 2018-10-16 ENCOUNTER — Ambulatory Visit (HOSPITAL_COMMUNITY)
Admission: RE | Admit: 2018-10-16 | Discharge: 2018-10-16 | Disposition: A | Discharge status: Home / Self Care | Payer: Medicare Other | Attending: Gastroenterology | Admitting: Gastroenterology

## 2018-10-16 ENCOUNTER — Ambulatory Visit (HOSPITAL_COMMUNITY): Payer: Medicare Other | Admitting: Certified Registered Nurse Anesthetist

## 2018-10-16 ENCOUNTER — Other Ambulatory Visit: Payer: Self-pay

## 2018-10-16 ENCOUNTER — Encounter (HOSPITAL_COMMUNITY)
Admission: RE | Disposition: A | Discharge status: Home / Self Care | Payer: Self-pay | Source: Home / Self Care | Attending: Gastroenterology

## 2018-10-16 ENCOUNTER — Encounter (HOSPITAL_COMMUNITY): Payer: Self-pay | Admitting: *Deleted

## 2018-10-16 DIAGNOSIS — K449 Diaphragmatic hernia without obstruction or gangrene: Secondary | ICD-10-CM | POA: Diagnosis not present

## 2018-10-16 DIAGNOSIS — Z888 Allergy status to other drugs, medicaments and biological substances status: Secondary | ICD-10-CM | POA: Insufficient documentation

## 2018-10-16 DIAGNOSIS — Z79899 Other long term (current) drug therapy: Secondary | ICD-10-CM | POA: Insufficient documentation

## 2018-10-16 DIAGNOSIS — K228 Other specified diseases of esophagus: Secondary | ICD-10-CM | POA: Diagnosis not present

## 2018-10-16 DIAGNOSIS — E1122 Type 2 diabetes mellitus with diabetic chronic kidney disease: Secondary | ICD-10-CM | POA: Insufficient documentation

## 2018-10-16 DIAGNOSIS — Z7901 Long term (current) use of anticoagulants: Secondary | ICD-10-CM | POA: Insufficient documentation

## 2018-10-16 DIAGNOSIS — J45909 Unspecified asthma, uncomplicated: Secondary | ICD-10-CM | POA: Insufficient documentation

## 2018-10-16 DIAGNOSIS — E1142 Type 2 diabetes mellitus with diabetic polyneuropathy: Secondary | ICD-10-CM | POA: Diagnosis not present

## 2018-10-16 DIAGNOSIS — K31819 Angiodysplasia of stomach and duodenum without bleeding: Secondary | ICD-10-CM | POA: Diagnosis not present

## 2018-10-16 DIAGNOSIS — I1310 Hypertensive heart and chronic kidney disease without heart failure, with stage 1 through stage 4 chronic kidney disease, or unspecified chronic kidney disease: Secondary | ICD-10-CM | POA: Diagnosis not present

## 2018-10-16 DIAGNOSIS — R110 Nausea: Secondary | ICD-10-CM | POA: Diagnosis present

## 2018-10-16 DIAGNOSIS — Z7951 Long term (current) use of inhaled steroids: Secondary | ICD-10-CM | POA: Diagnosis not present

## 2018-10-16 DIAGNOSIS — Z885 Allergy status to narcotic agent status: Secondary | ICD-10-CM | POA: Insufficient documentation

## 2018-10-16 DIAGNOSIS — Z96651 Presence of right artificial knee joint: Secondary | ICD-10-CM | POA: Insufficient documentation

## 2018-10-16 DIAGNOSIS — D500 Iron deficiency anemia secondary to blood loss (chronic): Secondary | ICD-10-CM | POA: Diagnosis present

## 2018-10-16 DIAGNOSIS — K219 Gastro-esophageal reflux disease without esophagitis: Secondary | ICD-10-CM | POA: Diagnosis not present

## 2018-10-16 DIAGNOSIS — I4891 Unspecified atrial fibrillation: Secondary | ICD-10-CM | POA: Insufficient documentation

## 2018-10-16 DIAGNOSIS — N183 Chronic kidney disease, stage 3 (moderate): Secondary | ICD-10-CM | POA: Diagnosis not present

## 2018-10-16 DIAGNOSIS — Z91048 Other nonmedicinal substance allergy status: Secondary | ICD-10-CM | POA: Diagnosis not present

## 2018-10-16 DIAGNOSIS — E7800 Pure hypercholesterolemia, unspecified: Secondary | ICD-10-CM | POA: Insufficient documentation

## 2018-10-16 DIAGNOSIS — Z794 Long term (current) use of insulin: Secondary | ICD-10-CM | POA: Diagnosis not present

## 2018-10-16 DIAGNOSIS — E113299 Type 2 diabetes mellitus with mild nonproliferative diabetic retinopathy without macular edema, unspecified eye: Secondary | ICD-10-CM | POA: Insufficient documentation

## 2018-10-16 HISTORY — PX: GI RADIOFREQUENCY ABLATION: SHX6807

## 2018-10-16 HISTORY — PX: ESOPHAGOGASTRODUODENOSCOPY (EGD) WITH PROPOFOL: SHX5813

## 2018-10-16 HISTORY — PX: BIOPSY: SHX5522

## 2018-10-16 LAB — HEMOGLOBIN AND HEMATOCRIT, BLOOD
HCT: 35.2 % — ABNORMAL LOW (ref 36.0–46.0)
Hemoglobin: 10.4 g/dL — ABNORMAL LOW (ref 12.0–15.0)

## 2018-10-16 LAB — GLUCOSE, CAPILLARY: GLUCOSE-CAPILLARY: 131 mg/dL — AB (ref 70–99)

## 2018-10-16 SURGERY — ESOPHAGOGASTRODUODENOSCOPY (EGD) WITH PROPOFOL
Anesthesia: Monitor Anesthesia Care

## 2018-10-16 MED ORDER — LACTATED RINGERS IV SOLN
INTRAVENOUS | Status: DC
  Administered 2018-10-16: 08:00:00 via INTRAVENOUS

## 2018-10-16 MED ORDER — PROPOFOL 500 MG/50ML IV EMUL
INTRAVENOUS | Status: DC | PRN
  Administered 2018-10-16: 125 ug/kg/min via INTRAVENOUS

## 2018-10-16 MED ORDER — SODIUM CHLORIDE 0.9 % IV SOLN: INTRAVENOUS | Status: DC

## 2018-10-16 MED ORDER — PROPOFOL 10 MG/ML IV BOLUS
INTRAVENOUS | Status: AC
  Filled 2018-10-16: qty 40

## 2018-10-16 MED ORDER — ONDANSETRON HCL 4 MG/2ML IJ SOLN
INTRAMUSCULAR | Status: DC | PRN
  Administered 2018-10-16: 4 mg via INTRAVENOUS

## 2018-10-16 MED ORDER — LIDOCAINE 2% (20 MG/ML) 5 ML SYRINGE
INTRAMUSCULAR | Status: DC | PRN
  Administered 2018-10-16: 60 mg via INTRAVENOUS

## 2018-10-16 SURGICAL SUPPLY — 14 items

## 2018-10-16 NOTE — Op Note (Signed)
[facility] Patient Name: [name] Procedure Date: 10/16/2018 MRN: [id number] Attending MD: Clarene Essex , MD Date of Birth: 28-Dec-1940 CSN: [id number] Age: 78 Admit Type: Outpatient Procedure:                Upper GI endoscopy Indications:              Iron deficiency anemia secondary to chronic blood                            loss, Nausea Providers:                Clarene Essex, MD, Jeanella Cara, RN, Tinnie Gens, Technician, Virgia Land, CRNA Referring MD:              Medicines:                Propofol total dose 250 mg IV, Ondansetron 4 mg IV,                            lidocaine 60 mg Complications:            No immediate complications. Except 1 self-limited                            short episode of bradycardia not requiring therapy                            or intervention Estimated Blood Loss:     Estimated blood loss: none. Procedure:                Pre-Anesthesia Assessment:                           - Prior to the procedure, a History and Physical                            was performed, and patient medications and                            allergies were reviewed. The patient's tolerance of                            previous anesthesia was also reviewed. The risks                            and benefits of the procedure and the sedation                            options and risks were discussed with the patient.                            All questions were answered, and informed consent  was obtained. Prior Anticoagulants: The patient has                            taken Eliquis (apixaban), last dose was 3 days                            prior to procedure. ASA Grade Assessment: III - A                            patient with severe systemic disease. After                            reviewing the risks and benefits, the patient was                            deemed in satisfactory  condition to undergo the                            procedure.                           After obtaining informed consent, the endoscope was                            passed under direct vision. Throughout the                            procedure, the patient's blood pressure, pulse, and                            oxygen saturations were monitored continuously. The                            GIF-H190 ([id number]) Olympus gastroscope was                            introduced through the mouth, and advanced to the                            third part of duodenum. The upper GI endoscopy was                            accomplished without difficulty. The patient                            tolerated the procedure well. Scope In: Scope Out: Findings:      The larynx was normal.      A small hiatal hernia was present.      There were esophageal mucosal changes suspicious for very short-segment       Barrett's esophagus present at the gastroesophageal junction. Biopsies       were taken with a cold forceps for histology.      Mild gastric antral vascular ectasia without bleeding was present in the       gastric antrum. Focal  radiofrequency ablation of gastric antral vascular       ectasia in the gastric antrum was performed. With the endoscope in       place, the position and extent of the abnormal mucosa and appropriate       anatomic landmarks were noted. The radiofrequency channel ablation       catheter was introduced through the endoscope working channel. The       endoscope with the ablation catheter was advanced to the areas of       abnormal mucosa. The endoscope with the channel ablation catheter was       positioned under direct visualization so that the catheter was placed in       contact with the surface of the abnormal mucosa. Energy was applied       twice at 12 J/cm2. The channel ablation catheter was then removed       through the endoscope working channel, and the ablation  catheter was       cleaned. The endoscope was left in place. The ablation catheter was       reinserted into the endoscope working channel. A second round of       ablation was then performed. Energy was applied twice at 12 J/cm2 to       retreat the areas of abnormal mucosa that had been treated with the       first series of ablation. The ablation catheter was removed through the       endoscope working channel. The areas where abnormal mucosa had been       ablated were examined. Areas of abnormal mucosa appeared completely       ablated. 47 bursts were used throughout the procedure and the endoscope       was then removed.      The duodenal bulb, first portion of the duodenum, second portion of the       duodenum and third portion of the duodenum were normal.      The exam was otherwise without abnormality. Impression:               - Normal larynx.                           - Small hiatal hernia.                           - Esophageal mucosal changes suspicious for very                            short-segment Barrett's esophagus. Biopsied.                           - Gastric antral vascular ectasia without bleeding.                            Treated with radiofrequency ablation.                           - Normal duodenal bulb, first portion of the                            duodenum, second portion of the duodenum and  third                            portion of the duodenum.                           - The examination was otherwise normal. Moderate Sedation:      Not Applicable - Patient had care per Anesthesia. Recommendation:           - Patient has a contact number available for                            emergencies. The signs and symptoms of potential                            delayed complications were discussed with the                            patient. Return to normal activities tomorrow.                            Written discharge instructions were provided to the                             patient.                           - Soft diet today.                           - Continue present medications.                           - Resume Eliquis (apixaban) at prior dose tomorrow.                           - Await pathology results.                           - Return to GI clinic in 4 weeks.                           - Telephone GI clinic for pathology results in 1                            week.                           - Telephone GI clinic if symptomatic PRN. Procedure Code(s):        --- Professional ---                           81191, 33, Esophagogastroduodenoscopy, flexible,                            transoral; with control of bleeding, any method  27253, Esophagogastroduodenoscopy, flexible,                            transoral; with biopsy, single or multiple Diagnosis Code(s):        --- Professional ---                           K22.8, Other specified diseases of esophagus                           K44.9, Diaphragmatic hernia without obstruction or                            gangrene                           K31.819, Angiodysplasia of stomach and duodenum                            without bleeding                           D50.0, Iron deficiency anemia secondary to blood                            loss (chronic)                           R11.0, Nausea CPT copyright 2018 American [employer] Association. All rights reserved. The codes documented in this report are preliminary and upon coder review may  be revised to meet current compliance requirements. Clarene Essex, MD 10/16/2018 9:17:03 AM This report has been signed electronically. Number of Addenda: 0

## 2018-10-16 NOTE — Anesthesia Preprocedure Evaluation (Signed)
Anesthesia Evaluation  Patient identified by MRN, date of birth, ID band Patient awake    Reviewed: Allergy & Precautions, NPO status , Patient's Chart, lab work & pertinent test results  History of Anesthesia Complications (+) PONV  Airway Mallampati: II  TM Distance: >3 FB Neck ROM: Full    Dental no notable dental hx.    Pulmonary neg pulmonary ROS,    Pulmonary exam normal breath sounds clear to auscultation       Cardiovascular hypertension, Normal cardiovascular exam Rhythm:Regular Rate:Normal     Neuro/Psych negative neurological ROS  negative psych ROS   GI/Hepatic Neg liver ROS, hiatal hernia, GERD  ,  Endo/Other  diabetes, Insulin Dependent  Renal/GU negative Renal ROS  negative genitourinary   Musculoskeletal negative musculoskeletal ROS (+)   Abdominal   Peds negative pediatric ROS (+)  Hematology negative hematology ROS (+)   Anesthesia Other Findings   Reproductive/Obstetrics negative OB ROS                             Anesthesia Physical Anesthesia Plan  ASA: III  Anesthesia Plan: MAC   Post-op Pain Management:    Induction: Intravenous  PONV Risk Score and Plan: 0  Airway Management Planned: Simple Face Mask  Additional Equipment:   Intra-op Plan:   Post-operative Plan:   Informed Consent: I have reviewed the patients History and Physical, chart, labs and discussed the procedure including the risks, benefits and alternatives for the proposed anesthesia with the patient or authorized representative who has indicated his/her understanding and acceptance.     Dental advisory given  Plan Discussed with: CRNA and Surgeon  Anesthesia Plan Comments:         Anesthesia Quick Evaluation

## 2018-10-16 NOTE — Transfer of Care (Signed)
Immediate Anesthesia Transfer of Care Note  Patient: [name]  Procedure(s) Performed: ESOPHAGOGASTRODUODENOSCOPY (EGD) WITH PROPOFOL (N/A ) BIOPSY RADIO FREQUENCY ABLATION  Patient Location: PACU  Anesthesia Type:MAC  Level of Consciousness: awake, alert  and oriented  Airway & Oxygen Therapy: Patient Spontanous Breathing and Patient connected to nasal cannula oxygen  Post-op Assessment: Report given to RN and Post -op Vital signs reviewed and stable  Post vital signs: Reviewed and stable  Last Vitals:  Vitals Value Taken Time  BP 97/47 10/16/2018  9:16 AM  Temp    Pulse 69 10/16/2018  9:19 AM  Resp 24 10/16/2018  9:19 AM  SpO2 100 % 10/16/2018  9:19 AM  Vitals shown include unvalidated device data.  Last Pain:  Vitals:   10/16/18 0916  TempSrc:   PainSc: 0-No pain         Complications: No apparent anesthesia complications

## 2018-10-16 NOTE — Discharge Instructions (Signed)
YOU HAD AN ENDOSCOPIC PROCEDURE TODAY: Refer to the procedure report and other information in the discharge instructions given to you for any specific questions about what was found during the examination. If this information does not answer your questions, please call [facility] office at [phone] to clarify.   YOU SHOULD EXPECT: Some feelings of bloating in the abdomen. Passage of more gas than usual. Walking can help get rid of the air that was put into your GI tract during the procedure and reduce the bloating. If you had a lower endoscopy (such as a colonoscopy or flexible sigmoidoscopy) you may notice spotting of blood in your stool or on the toilet paper. Some abdominal soreness may be present for a day or two, also.  DIET: Your first meal following the procedure should be a light meal and then it is ok to progress to your normal diet. A half-sandwich or bowl of soup is an example of a good first meal. Heavy or fried foods are harder to digest and may make you feel nauseous or bloated. Drink plenty of fluids but you should avoid alcoholic beverages for 24 hours. If you had a esophageal dilation, please see attached instructions for diet.   ACTIVITY: Your care partner should take you home directly after the procedure. You should plan to take it easy, moving slowly for the rest of the day. You can resume normal activity the day after the procedure however YOU SHOULD NOT DRIVE, use power tools, machinery or perform tasks that involve climbing or major physical exertion for 24 hours (because of the sedation medicines used during the test).   SYMPTOMS TO REPORT IMMEDIATELY: A gastroenterologist can be reached at any hour. Please call [phone]  for any of the following symptoms:   Following lower endoscopy (colonoscopy, flexible sigmoidoscopy) Excessive amounts of blood in the stool  Significant tenderness, worsening of abdominal pains  Swelling of the abdomen that is new, acute  Fever of 100  or higher   Following upper endoscopy (EGD, EUS, ERCP, esophageal dilation) Vomiting of blood or coffee ground material  New, significant abdominal pain  New, significant chest pain or pain under the shoulder blades  Painful or persistently difficult swallowing  New shortness of breath  Black, tarry-looking or red, bloody stools  FOLLOW UP:  If any biopsies were taken you will be contacted by phone or by letter within the next 1-3 weeks. Call [phone]  if you have not heard about the biopsies in 3 weeks.  Please also call with any specific questions about appointments or follow up tests. Liquids only until noon and if doing well may have soft solids the rest of the day and call if question or problem otherwise call for biopsy report in 1 week and follow-up in 1 month and resume your Eliquis tomorrow if doing well

## 2018-10-16 NOTE — Anesthesia Postprocedure Evaluation (Signed)
Anesthesia Post Note  Patient: RACHEAL MATHURIN  Procedure(s) Performed: ESOPHAGOGASTRODUODENOSCOPY (EGD) WITH PROPOFOL (N/A ) BIOPSY RADIO FREQUENCY ABLATION     Patient location during evaluation: PACU Anesthesia Type: MAC Level of consciousness: awake and alert Pain management: pain level controlled Vital Signs Assessment: post-procedure vital signs reviewed and stable Respiratory status: spontaneous breathing, nonlabored ventilation, respiratory function stable and patient connected to nasal cannula oxygen Cardiovascular status: stable and blood pressure returned to baseline Postop Assessment: no apparent nausea or vomiting Anesthetic complications: no    Last Vitals:  Vitals:   10/16/18 0920 10/16/18 0930  BP: (!) 130/42 (!) 147/49  Pulse: 69 73  Resp: (!) 24 20  Temp:    SpO2: 100% 95%    Last Pain:  Vitals:   10/16/18 0930  TempSrc:   PainSc: 0-No pain                 Adynn Caseres S

## 2018-10-16 NOTE — Progress Notes (Addendum)
[name] 8:34 AM  Subjective: Patient doing better than when I saw her recently in the office with decreased nausea no new complaints no signs of bleeding  Objective: Signs stable afebrile exam please see preassessment evaluation hemoglobin minimally decreased from recent office visit  Assessment: Gave and chronic nausea  Plan: Okay to proceed with anesthesia and endoscopy today with probable APC or RFA of gave  [name]  Pager [phone] After 5PM or if no answer call [phone]

## 2018-10-17 ENCOUNTER — Encounter (HOSPITAL_COMMUNITY): Payer: Self-pay | Admitting: Gastroenterology

## 2018-10-21 ENCOUNTER — Ambulatory Visit: Payer: Medicare Other | Admitting: Podiatry

## 2018-10-28 ENCOUNTER — Other Ambulatory Visit: Payer: Self-pay | Admitting: Cardiology

## 2018-10-28 NOTE — Telephone Encounter (Signed)
°*  STAT* If patient is at the pharmacy, call can be transferred to refill team.   1. Which medications need to be refilled? (please list name of each medication and dose if known) apixaban (ELIQUIS) 5 MG TABS tablet  2. Which pharmacy/location (including street and city if local pharmacy) is medication to be sent to? [place], [place]  3. Do they need a 30 day or 90 day supply? 90 days

## 2018-10-29 MED ORDER — APIXABAN 5 MG PO TABS: 5.0000 mg | ORAL_TABLET | Freq: Two times a day (BID) | ORAL | 3 refills | Status: DC

## 2018-10-29 NOTE — Telephone Encounter (Signed)
Eliquis 5mg  refill request received; pt is 78 yrs old, wt-74.8kg, Crea-1.00 on 11/06/2017 and 0.89 via [facility] on 07/01/2018, last seen by Dr. Marlou Porch on 10/162019; will send in refill to requested pharmacy.

## 2019-03-03 ENCOUNTER — Other Ambulatory Visit: Payer: Self-pay | Admitting: Family Medicine

## 2019-03-03 DIAGNOSIS — Z1231 Encounter for screening mammogram for malignant neoplasm of breast: Secondary | ICD-10-CM

## 2019-04-15 ENCOUNTER — Ambulatory Visit
Admission: RE | Admit: 2019-04-15 | Discharge: 2019-04-15 | Disposition: A | Discharge status: Home / Self Care | Payer: Medicare Other | Source: Ambulatory Visit | Attending: Family Medicine | Admitting: Family Medicine

## 2019-04-15 ENCOUNTER — Other Ambulatory Visit: Payer: Self-pay

## 2019-04-15 DIAGNOSIS — Z1231 Encounter for screening mammogram for malignant neoplasm of breast: Secondary | ICD-10-CM

## 2019-04-25 ENCOUNTER — Other Ambulatory Visit: Payer: Self-pay

## 2019-04-28 ENCOUNTER — Other Ambulatory Visit: Payer: Self-pay

## 2019-04-28 ENCOUNTER — Encounter: Payer: Self-pay | Admitting: Podiatry

## 2019-04-28 ENCOUNTER — Ambulatory Visit (INDEPENDENT_AMBULATORY_CARE_PROVIDER_SITE_OTHER): Payer: Medicare Other | Admitting: Podiatry

## 2019-04-28 VITALS — Temp 98.0°F | Resp 16

## 2019-04-28 DIAGNOSIS — M79675 Pain in left toe(s): Secondary | ICD-10-CM

## 2019-04-28 DIAGNOSIS — B351 Tinea unguium: Secondary | ICD-10-CM

## 2019-04-28 DIAGNOSIS — M79674 Pain in right toe(s): Secondary | ICD-10-CM

## 2019-04-28 DIAGNOSIS — E1142 Type 2 diabetes mellitus with diabetic polyneuropathy: Secondary | ICD-10-CM

## 2019-04-28 DIAGNOSIS — E119 Type 2 diabetes mellitus without complications: Secondary | ICD-10-CM

## 2019-04-28 NOTE — Progress Notes (Signed)
Subjective:  Patient ID: [name], female    DOB: 08-29-1940,  MRN: [id number]  Chief Complaint  Patient presents with  . debride    Diabetic nail trimming -pt denies foot issues  . Diabetes    FBS: 101 A1C: 5.7 PCP: [facility]    78 y.o. female presents  for diabetic foot care. Last AMBS was 101. Reports numbness and tingling in their feet. Denies cramping in legs and thighs.  Review of Systems: Negative except as noted in the HPI. Denies N/V/F/Ch.  Past Medical History:  Diagnosis Date  . Acute GI bleeding 05/13/2015  . Anxiety   . Arthritis   . Asthma   . Atrial fibrillation ([place]) 10/29/2017  . Bakers cyst, left   . Chronic kidney disease    stage 3  . Constipation   . Diabetes mellitus    type 2  . GERD (gastroesophageal reflux disease)   . Heart murmur   . History of cellulitis   . History of dizziness   . History of hiatal hernia    tiny 06/15/2016  . Hypertension   . Iron deficiency anemia   . Peripheral vascular disease ([place])   . PONV (postoperative nausea and vomiting)   . Pulmonary nodule     Current Outpatient Medications:  .  albuterol (PROAIR HFA) 108 (90 BASE) MCG/ACT inhaler, Inhale 2 puffs into the lungs every 4 (four) hours as needed for wheezing or shortness of breath., Disp: 1 Inhaler, Rfl: 3 .  ALPRAZolam (XANAX) 0.25 MG tablet, TAKE 1/2 1 TABLET BY MOUTH ONCE A DAY, Disp: , Rfl:  .  amLODipine (NORVASC) 10 MG tablet, Take 10 mg by mouth daily., Disp: , Rfl:  .  apixaban (ELIQUIS) 5 MG TABS tablet, Take 1 tablet (5 mg total) by mouth 2 (two) times daily., Disp: 180 tablet, Rfl: 3 .  docusate sodium (COLACE) 100 MG capsule, Take 1 capsule (100 mg total) by mouth 2 (two) times daily., Disp: 10 capsule, Rfl: 0 .  esomeprazole (NEXIUM) 40 MG capsule, Take 40 mg by mouth daily., Disp: , Rfl: 0 .  fluticasone (FLONASE) 50 MCG/ACT nasal spray, Place 2 sprays into both nostrils daily as needed for allergies or rhinitis., Disp: , Rfl:  .   gabapentin (NEURONTIN) 300 MG capsule, Take 600 mg by mouth 3 (three) times daily. , Disp: , Rfl:  .  Insulin Glargine (LANTUS SOLOSTAR) 100 UNIT/ML Solostar Pen, Inject 42 Units into the skin at bedtime. , Disp: , Rfl:  .  insulin lispro (HUMALOG) 100 UNIT/ML SOCT, Inject 18-20 Units into the skin See admin instructions. Inject 18 units SQ at breakfast, inject 20 units SQ at lunch and inject 20 units SQ at dinner, Disp: , Rfl:  .  losartan (COZAAR) 100 MG tablet, Take 100 mg by mouth daily., Disp: , Rfl:  .  metoprolol (LOPRESSOR) 50 MG tablet, Take 75-100 mg by mouth See admin instructions. Take 100 mg by mouth in the morning and take 75 mg by mouth in the evening, Disp: , Rfl:  .  Multiple Vitamins-Minerals (PRESERVISION AREDS 2 PO), Take 1 capsule by mouth 2 (two) times daily. , Disp: , Rfl:  .  Pitavastatin Calcium (LIVALO) 1 MG TABS, Take 1 mg by mouth at bedtime., Disp: , Rfl:  .  polyethylene glycol (MIRALAX / GLYCOLAX) packet, Take 17 g by mouth 2 (two) times daily. (Patient taking differently: Take 17 g by mouth daily as needed for moderate constipation. ), Disp: 14 each,  Rfl: 0 .  triamterene-hydrochlorothiazide (MAXZIDE-25) 37.5-25 MG tablet, Take 2 capsules by mouth daily. , Disp: , Rfl:  .  venlafaxine XR (EFFEXOR-XR) 37.5 MG 24 hr capsule, Take 37.5-75 mg by mouth See admin instructions. Take 75 mg by mouth in the morning and take 37.5 mg by mouth in the evening, Disp: , Rfl:   Social History   Tobacco Use  Smoking Status Never Smoker  Smokeless Tobacco Never Used    Allergies  Allergen Reactions  . Vasotec Shortness Of Breath and Other (See Comments)    wheezing  . Atorvastatin Other (See Comments)    Leg cramps   . Codeine Nausea And Vomiting  . Cymbalta [Duloxetine Hcl] Itching  . Lansoprazole Itching, Swelling, Rash and Other (See Comments)    Redness, swelling of mouth   . Morphine And Related Itching  . Sulfate Itching  . Adhesive [Tape] Rash  . Scopolamine Rash    Objective:   Vitals:   04/28/19 1409  Resp: 16  Temp: 98 F (36.7 C)   There is no height or weight on file to calculate BMI. Constitutional Well developed. Well nourished.  Vascular Dorsalis pedis pulses present 1+ bilaterally  Posterior tibial pulses present 1+ bilaterally  Pedal hair growth diminished. Capillary refill normal to all digits.  No cyanosis or clubbing noted.  Neurologic Normal speech. Oriented to person, place, and time. Epicritic sensation to light touch grossly present bilaterally. Protective sensation with 5.07 monofilament  absent bilaterally.  Dermatologic Nails elongated, thickened, dystrophic. No open wounds. No skin lesions.  Orthopedic: Normal joint ROM without pain or crepitus bilaterally. No visible deformities. No bony tenderness.   Assessment:   1. Pain due to onychomycosis of toenails of both feet   2. Diabetic peripheral neuropathy associated with type 2 diabetes mellitus ([place])   3. Encounter for diabetic foot exam [facility])    Plan:  Patient was evaluated and treated and all questions answered.  Diabetes with DPN, Onychomycosis -Educated on diabetic footcare. Diabetic risk level 1 -Nails x10 debrided sharply and manually with large nail nipper and rotary burr.   Procedure: Nail Debridement Rationale: Patient meets criteria for routine foot care due to DPN Type of Debridement: manual, sharp debridement. Instrumentation: Nail nipper, rotary burr. Number of Nails: 10  No follow-ups on file.

## 2019-07-28 ENCOUNTER — Ambulatory Visit (INDEPENDENT_AMBULATORY_CARE_PROVIDER_SITE_OTHER): Payer: Medicare Other | Admitting: Podiatry

## 2019-07-28 ENCOUNTER — Other Ambulatory Visit: Payer: Self-pay

## 2019-07-28 DIAGNOSIS — M79675 Pain in left toe(s): Secondary | ICD-10-CM | POA: Diagnosis not present

## 2019-07-28 DIAGNOSIS — M79674 Pain in right toe(s): Secondary | ICD-10-CM

## 2019-07-28 DIAGNOSIS — B351 Tinea unguium: Secondary | ICD-10-CM

## 2019-07-28 DIAGNOSIS — E1142 Type 2 diabetes mellitus with diabetic polyneuropathy: Secondary | ICD-10-CM

## 2019-07-28 NOTE — Progress Notes (Signed)
Subjective:  Patient ID: [name], female    DOB: 07-11-1941,  MRN: [id number]  No chief complaint on file.   78 y.o. female presents  for diabetic foot care. Last AMBS was 83. Last A1c 537. Saw PCP Dr. Darrelyn Hillock 2 months ago. Reports numbness and tingling in their feet. Denies cramping in legs and thighs.  Review of Systems: Negative except as noted in the HPI. Denies N/V/F/Ch.  Past Medical History:  Diagnosis Date  . Acute GI bleeding 05/13/2015  . Anxiety   . Arthritis   . Asthma   . Atrial fibrillation ([place]) 10/29/2017  . Bakers cyst, left   . Chronic kidney disease    stage 3  . Constipation   . Diabetes mellitus    type 2  . GERD (gastroesophageal reflux disease)   . Heart murmur   . History of cellulitis   . History of dizziness   . History of hiatal hernia    tiny 06/15/2016  . Hypertension   . Iron deficiency anemia   . Peripheral vascular disease ([place])   . PONV (postoperative nausea and vomiting)   . Pulmonary nodule     Current Outpatient Medications:  .  albuterol (PROAIR HFA) 108 (90 BASE) MCG/ACT inhaler, Inhale 2 puffs into the lungs every 4 (four) hours as needed for wheezing or shortness of breath., Disp: 1 Inhaler, Rfl: 3 .  ALPRAZolam (XANAX) 0.25 MG tablet, TAKE 1/2 1 TABLET BY MOUTH ONCE A DAY, Disp: , Rfl:  .  amLODipine (NORVASC) 10 MG tablet, Take 10 mg by mouth daily., Disp: , Rfl:  .  apixaban (ELIQUIS) 5 MG TABS tablet, Take 1 tablet (5 mg total) by mouth 2 (two) times daily., Disp: 180 tablet, Rfl: 3 .  docusate sodium (COLACE) 100 MG capsule, Take 1 capsule (100 mg total) by mouth 2 (two) times daily., Disp: 10 capsule, Rfl: 0 .  esomeprazole (NEXIUM) 40 MG capsule, Take 40 mg by mouth daily., Disp: , Rfl: 0 .  fluticasone (FLONASE) 50 MCG/ACT nasal spray, Place 2 sprays into both nostrils daily as needed for allergies or rhinitis., Disp: , Rfl:  .  gabapentin (NEURONTIN) 300 MG capsule, Take 600 mg by mouth 3 (three) times daily. ,  Disp: , Rfl:  .  Insulin Glargine (LANTUS SOLOSTAR) 100 UNIT/ML Solostar Pen, Inject 42 Units into the skin at bedtime. , Disp: , Rfl:  .  insulin lispro (HUMALOG) 100 UNIT/ML SOCT, Inject 18-20 Units into the skin See admin instructions. Inject 18 units SQ at breakfast, inject 20 units SQ at lunch and inject 20 units SQ at dinner, Disp: , Rfl:  .  losartan (COZAAR) 100 MG tablet, Take 100 mg by mouth daily., Disp: , Rfl:  .  metoprolol (LOPRESSOR) 50 MG tablet, Take 75-100 mg by mouth See admin instructions. Take 100 mg by mouth in the morning and take 75 mg by mouth in the evening, Disp: , Rfl:  .  Multiple Vitamins-Minerals (PRESERVISION AREDS 2 PO), Take 1 capsule by mouth 2 (two) times daily. , Disp: , Rfl:  .  Pitavastatin Calcium (LIVALO) 1 MG TABS, Take 1 mg by mouth at bedtime., Disp: , Rfl:  .  polyethylene glycol (MIRALAX / GLYCOLAX) packet, Take 17 g by mouth 2 (two) times daily. (Patient taking differently: Take 17 g by mouth daily as needed for moderate constipation. ), Disp: 14 each, Rfl: 0 .  triamterene-hydrochlorothiazide (MAXZIDE-25) 37.5-25 MG tablet, Take 2 capsules by mouth daily. , Disp: , Rfl:  .  venlafaxine XR (EFFEXOR-XR) 37.5 MG 24 hr capsule, Take 37.5-75 mg by mouth See admin instructions. Take 75 mg by mouth in the morning and take 37.5 mg by mouth in the evening, Disp: , Rfl:   Social History   Tobacco Use  Smoking Status Never Smoker  Smokeless Tobacco Never Used    Allergies  Allergen Reactions  . Vasotec Shortness Of Breath and Other (See Comments)    wheezing  . Atorvastatin Other (See Comments)    Leg cramps   . Codeine Nausea And Vomiting  . Cymbalta [Duloxetine Hcl] Itching  . Lansoprazole Itching, Swelling, Rash and Other (See Comments)    Redness, swelling of mouth   . Morphine And Related Itching  . Sulfate Itching  . Adhesive [Tape] Rash  . Scopolamine Rash   Objective:   There were no vitals filed for this visit. There is no height or  weight on file to calculate BMI. Constitutional Well developed. Well nourished.  Vascular Dorsalis pedis pulses present 1+ bilaterally  Posterior tibial pulses present 1+ bilaterally  Pedal hair growth diminished. Capillary refill normal to all digits.  No cyanosis or clubbing noted.  Neurologic Normal speech. Oriented to person, place, and time. Epicritic sensation to light touch grossly present bilaterally. Protective sensation with 5.07 monofilament  absent bilaterally.  Dermatologic Nails elongated, thickened, dystrophic. No open wounds. No skin lesions.  Orthopedic: Normal joint ROM without pain or crepitus bilaterally. No visible deformities. No bony tenderness.   Assessment:   1. Pain due to onychomycosis of toenails of both feet   2. Diabetic peripheral neuropathy associated with type 2 diabetes mellitus ([place])    Plan:  Patient was evaluated and treated and all questions answered.  Diabetes with DPN, Onychomycosis -Educated on diabetic footcare. Diabetic risk level 1 -Nails x10 debrided sharply and manually with large nail nipper and rotary burr.   Procedure: Nail Debridement Rationale: Patient meets criteria for routine foot care due to DPN Type of Debridement: manual, sharp debridement. Instrumentation: Nail nipper, rotary burr. Number of Nails: 10    Return in about 3 months (around 10/26/2019) for Diabetic Foot Care.

## 2019-09-12 ENCOUNTER — Encounter: Payer: Self-pay | Admitting: Cardiology

## 2019-09-12 ENCOUNTER — Ambulatory Visit (INDEPENDENT_AMBULATORY_CARE_PROVIDER_SITE_OTHER): Payer: Medicare PPO | Admitting: Cardiology

## 2019-09-12 ENCOUNTER — Other Ambulatory Visit: Payer: Self-pay

## 2019-09-12 VITALS — BP 140/64 | HR 65 | Ht 64.0 in | Wt 155.0 lb

## 2019-09-12 DIAGNOSIS — R110 Nausea: Secondary | ICD-10-CM

## 2019-09-12 DIAGNOSIS — I10 Essential (primary) hypertension: Secondary | ICD-10-CM

## 2019-09-12 DIAGNOSIS — E782 Mixed hyperlipidemia: Secondary | ICD-10-CM

## 2019-09-12 DIAGNOSIS — I480 Paroxysmal atrial fibrillation: Secondary | ICD-10-CM | POA: Diagnosis not present

## 2019-09-12 NOTE — Patient Instructions (Signed)
Medication Instructions:  Continue you current medications as listed but try changing your blood pressure medications to dinnertime. (to help with nausea)  *If you need a refill on your cardiac medications before your next appointment, please call your pharmacy*  Follow-Up: At [facility], you and your health needs are our priority.  As part of our continuing mission to provide you with exceptional heart care, we have created designated Provider Care Teams.  These Care Teams include your primary Cardiologist (physician) and Advanced Practice Providers (APPs -  Physician Assistants and Nurse Practitioners) who all work together to provide you with the care you need, when you need it.  Your next appointment:   12 month(s)  The format for your next appointment:   In Person  Provider:   Candee Furbish, MD  Thank you for choosing [facility]!!

## 2019-09-12 NOTE — Progress Notes (Signed)
Cardiology Office Note:    Date:  09/12/2019   ID:  [name], DOB Dec 08, 1940, MRN [id number]  PCP:  Leighton Ruff, MD  Cardiologist:  Candee Furbish, MD  Electrophysiologist:  None   Referring MD: Leighton Ruff, MD     History of Present Illness:    [name] is a 79 y.o. female here for follow-up of atrial fibrillation.  Prior patient of Dr. Thurman Coyer.  She has a history of paroxysmal atrial fibrillation, hyperlipidemia, diabetes with neuropathy.  Has been taking Eliquis twice a day as well as losartan metoprolol amlodipine.  No complaints.  Blood pressures been under good control.  She brought an EKG from 10/23/2017 which was personally reviewed that showed artifact but appears to be sinus rhythm 75.  EGD - PUD bleeding 2 years ago, [place]. Colonoscopy. 5 year.   Her husband had MI, taken care of by Dr. Burt Knack.  09/12/19 - nausea every morning.  This happens after she takes all of her blood pressure medications.  Her blood pressure still remains elevated at home however.  She was wondering if her pills could be causing some of this.  Dr. Watt Climes has worked up in the past.  See below for details.  Past Medical History:  Diagnosis Date  . Acute GI bleeding 05/13/2015  . Anxiety   . Arthritis   . Asthma   . Atrial fibrillation ([place]) 10/29/2017  . Bakers cyst, left   . Chronic kidney disease    stage 3  . Constipation   . Diabetes mellitus    type 2  . GERD (gastroesophageal reflux disease)   . Heart murmur   . History of cellulitis   . History of dizziness   . History of hiatal hernia    tiny 06/15/2016  . Hypertension   . Iron deficiency anemia   . Peripheral vascular disease ([place])   . PONV (postoperative nausea and vomiting)   . Pulmonary nodule     Past Surgical History:  Procedure Laterality Date  . APPENDECTOMY  1952  . BIOPSY  10/16/2018   Procedure: BIOPSY;  Surgeon: Clarene Essex, MD;  Location: [facility];  Service: Endoscopy;;  .  COLONOSCOPY W/ POLYPECTOMY  05/15/2013  . ESOPHAGOGASTRODUODENOSCOPY (EGD) WITH PROPOFOL N/A 06/15/2016   Procedure: ESOPHAGOGASTRODUODENOSCOPY (EGD) WITH PROPOFOL;  Surgeon: Clarene Essex, MD;  Location: [facility];  Service: Endoscopy;  Laterality: N/A;  . ESOPHAGOGASTRODUODENOSCOPY (EGD) WITH PROPOFOL N/A 10/16/2018   Procedure: ESOPHAGOGASTRODUODENOSCOPY (EGD) WITH PROPOFOL;  Surgeon: Clarene Essex, MD;  Location: [facility];  Service: Endoscopy;  Laterality: N/A;  . EYE SURGERY    . GI RADIOFREQUENCY ABLATION  10/16/2018   Procedure: GI RADIOFREQUENCY ABLATION;  Surgeon: Clarene Essex, MD;  Location: [facility];  Service: Endoscopy;;  . HAND SURGERY Left 2010   operated on index and ring finger  . INCONTINENCE SURGERY    . JOINT REPLACEMENT Right    knee  . KNEE ARTHROSCOPY  2003 2005   right knee  . mass fallopian tube    . SPINAL CORD STIMULATOR BATTERY EXCHANGE N/A 09/09/2015   Procedure: SPINAL CORD STIMULATOR BATTERY EXCHANGE;  Surgeon: Melina Schools, MD;  Location: [facility];  Service: Orthopedics;  Laterality: N/A;  . TOTAL KNEE ARTHROPLASTY Left 11/05/2017   Procedure: LEFT TOTAL KNEE ARTHROPLASTY;  Surgeon: Paralee Cancel, MD;  Location: [facility];  Service: Orthopedics;  Laterality: Left;  Adductor Block  . VAGINAL HYSTERECTOMY  1977   1 ovary removed    Current Medications: Current Meds  Medication Sig  . albuterol (PROAIR HFA) 108 (90 BASE) MCG/ACT inhaler Inhale 2 puffs into the lungs every 4 (four) hours as needed for wheezing or shortness of breath.  . ALPRAZolam (XANAX) 0.25 MG tablet TAKE 1/2 1 TABLET BY MOUTH ONCE A DAY  . amLODipine (NORVASC) 10 MG tablet Take 10 mg by mouth daily.  Marland Kitchen apixaban (ELIQUIS) 5 MG TABS tablet Take 1 tablet (5 mg total) by mouth 2 (two) times daily.  Marland Kitchen docusate sodium (COLACE) 100 MG capsule Take 1 capsule (100 mg total) by mouth 2 (two) times daily.  Marland Kitchen esomeprazole (NEXIUM) 40 MG capsule Take 40 mg by mouth daily.  . fluticasone (FLONASE) 50  MCG/ACT nasal spray Place 2 sprays into both nostrils daily as needed for allergies or rhinitis.  Marland Kitchen gabapentin (NEURONTIN) 300 MG capsule Take 600 mg by mouth 3 (three) times daily.   . Insulin Glargine (LANTUS SOLOSTAR) 100 UNIT/ML Solostar Pen Inject 42 Units into the skin at bedtime.   . insulin lispro (HUMALOG) 100 UNIT/ML SOCT Inject 18-20 Units into the skin See admin instructions. Inject 18 units SQ at breakfast, inject 20 units SQ at lunch and inject 20 units SQ at dinner  . losartan (COZAAR) 100 MG tablet Take 100 mg by mouth daily.  . metoprolol (LOPRESSOR) 50 MG tablet Take 75-100 mg by mouth See admin instructions. Take 100 mg by mouth in the morning and take 75 mg by mouth in the evening  . Multiple Vitamins-Minerals (PRESERVISION AREDS 2 PO) Take 1 capsule by mouth 2 (two) times daily.   . polyethylene glycol (MIRALAX / GLYCOLAX) packet Take 17 g by mouth 2 (two) times daily. (Patient taking differently: Take 17 g by mouth daily as needed for moderate constipation. )  . triamterene-hydrochlorothiazide (MAXZIDE-25) 37.5-25 MG tablet Take 2 capsules by mouth daily.   Marland Kitchen venlafaxine XR (EFFEXOR-XR) 37.5 MG 24 hr capsule Take 37.5-75 mg by mouth See admin instructions. Take 75 mg by mouth in the morning and take 37.5 mg by mouth in the evening     Allergies:   Vasotec, Atorvastatin, Codeine, Cymbalta [duloxetine hcl], Lansoprazole, Morphine and related, Sulfate, Adhesive [tape], and Scopolamine   Social History   Socioeconomic History  . Marital status: Married    Spouse name: Charlotte Crumb  . Number of children: 2  . Years of education: 78  . Highest education level: Not on file  Occupational History    Comment: retired Pharmacist, hospital, IT sales professional  Tobacco Use  . Smoking status: Never Smoker  . Smokeless tobacco: Never Used  Substance and Sexual Activity  . Alcohol use: No  . Drug use: No  . Sexual activity: Never    Birth control/protection: Surgical  Other Topics Concern  . Not on  file  Social History Narrative   Lives with husband   Caffeine use- none   Social Determinants of Health   Financial Resource Strain:   . Difficulty of Paying Living Expenses: Not on file  Food Insecurity:   . Worried About Charity fundraiser in the Last Year: Not on file  . Ran Out of Food in the Last Year: Not on file  Transportation Needs:   . Lack of Transportation (Medical): Not on file  . Lack of Transportation (Non-Medical): Not on file  Physical Activity:   . Days of Exercise per Week: Not on file  . Minutes of Exercise per Session: Not on file  Stress:   . Feeling of Stress : Not on file  Social Connections:   . Frequency of Communication with Friends and Family: Not on file  . Frequency of Social Gatherings with Friends and Family: Not on file  . Attends Religious Services: Not on file  . Active Member of Clubs or Organizations: Not on file  . Attends Archivist Meetings: Not on file  . Marital Status: Not on file     Family History: The patient's family history includes Heart attack in her father; Lung cancer in her mother. There is no history of Colon cancer, Stroke, or Migraines.  ROS:   Please see the history of present illness.     All other systems reviewed and are negative.  EKGs/Labs/Other Studies Reviewed:    The following studies were reviewed today: Office notes, lab work, prior EKG reviewed  EKG:  EKG is not ordered today.  The ekg ordered today demonstrates 09/12/2019-sinus rhythm 65 with no other abnormalities-previous one described above, sinus rhythm 09/2017, and 10/2017  Recent Labs: 10/16/2018: Hemoglobin 10.4  Recent Lipid Panel No results found for: CHOL, TRIG, HDL, CHOLHDL, VLDL, LDLCALC, LDLDIRECT  Physical Exam:    VS:  BP 140/64   Pulse 65   Ht 5\' 4"  (1.626 m)   Wt 155 lb (70.3 kg)   SpO2 98%   BMI 26.61 kg/m     Wt Readings from Last 3 Encounters:  09/12/19 155 lb (70.3 kg)  10/16/18 160 lb (72.6 kg)  06/12/18 164  lb 12.8 oz (74.8 kg)     GEN: Well nourished, well developed, in no acute distress  HEENT: normal  Neck: no JVD, carotid bruits, or masses Cardiac: RRR; no murmurs, rubs, or gallops,no edema  Respiratory:  clear to auscultation bilaterally, normal work of breathing GI: soft, nontender, nondistended, + BS MS: no deformity or atrophy  Skin: warm and dry, no rash Neuro:  Alert and Oriented x 3, Strength and sensation are intact Psych: euthymic mood, full affect   ASSESSMENT:    1. Paroxysmal atrial fibrillation (HCC)   2. Essential hypertension   3. Mixed hyperlipidemia   4. Nausea    PLAN:    In order of problems listed above:  Preoperative cardiovascular risk evaluation -She may proceed with colonoscopy, Dr. Watt Climes, holding her Eliquis for 2 days prior to procedure, normal renal function.  She is of low overall cardiac risk.  Paroxysmal atrial fibrillation - Continue with current plan.  No changes made.  Eliquis.  She knows to take this twice a day.  No bleeding.  Prior EKGs from February and March 2019 reviewed, sinus rhythm.  States that Dr. Wynonia Lawman diagnosed her with atrial fibrillation previously.  Excellent EKG today.  History of peptic ulcer bleeding 2017 -Dr. Watt Climes, laser treatment, most recent hemoglobin 12.2.  Outside labs 09/02/2019  Diabetes with essential hypertension -Multidrug regimen reviewed.  After taking her pills in the morning and eating a small breakfast, she feels nauseous does not feel well.  She has had extensive GI evaluation for this, Dr. Watt Climes.  I suggested to her that she try her blood pressure medications in the evening with dinner to see if this helps.  Hemoglobin A1c 5.7 from outside labs, Dr. Drema Dallas has been excellently managing.  Hyperlipidemia -Currently on Pitivastatin.  1 mg.  She had trouble before with other statin therapies.  Leg cramps. Excellent LDL 59 from outside labs reviewed October 2020.  We will see her back in 1 year.  Medication  Adjustments/Labs and Tests Ordered: Current medicines are reviewed at length  with the patient today.  Concerns regarding medicines are outlined above.  No orders of the defined types were placed in this encounter.  No orders of the defined types were placed in this encounter.   Patient Instructions  Medication Instructions:  Continue you current medications as listed but try changing your blood pressure medications to dinnertime. (to help with nausea)  *If you need a refill on your cardiac medications before your next appointment, please call your pharmacy*  Follow-Up: At [facility], you and your health needs are our priority.  As part of our continuing mission to provide you with exceptional heart care, we have created designated Provider Care Teams.  These Care Teams include your primary Cardiologist (physician) and Advanced Practice Providers (APPs -  Physician Assistants and Nurse Practitioners) who all work together to provide you with the care you need, when you need it.  Your next appointment:   12 month(s)  The format for your next appointment:   In Person  Provider:   Candee Furbish, MD  Thank you for choosing [facility]!!         Signed, Candee Furbish, MD  09/12/2019 11:46 AM    [place]

## 2019-09-15 NOTE — Addendum Note (Signed)
Addended by: De Burrs on: 09/15/2019 03:54 PM   Modules accepted: Orders

## 2019-09-29 ENCOUNTER — Other Ambulatory Visit: Payer: Self-pay

## 2019-09-29 ENCOUNTER — Ambulatory Visit (INDEPENDENT_AMBULATORY_CARE_PROVIDER_SITE_OTHER): Payer: Medicare PPO | Admitting: Podiatry

## 2019-09-29 DIAGNOSIS — M79675 Pain in left toe(s): Secondary | ICD-10-CM | POA: Diagnosis not present

## 2019-09-29 DIAGNOSIS — E1142 Type 2 diabetes mellitus with diabetic polyneuropathy: Secondary | ICD-10-CM | POA: Diagnosis not present

## 2019-09-29 DIAGNOSIS — M79674 Pain in right toe(s): Secondary | ICD-10-CM

## 2019-09-29 DIAGNOSIS — B351 Tinea unguium: Secondary | ICD-10-CM | POA: Diagnosis not present

## 2019-09-29 NOTE — Progress Notes (Signed)
  Subjective:  Patient ID: [name], female    DOB: 22-May-1941,  MRN: [id number]  Chief Complaint  Patient presents with  . debride    Diabetic nail trimming  . Diabetes    FBS: A1C: PCP:     78 y.o. female presents with the above complaint. History confirmed with patient.   Objective:  Physical Exam: warm, good capillary refill, onychomycosis of the toenails with pain to palpation of the nailbeds, no trophic changes or ulcerative lesions, normal DP and PT pulses, and absent protective sensation Left Foot: normal exam, no swelling, tenderness, instability; ligaments intact, full range of motion of all ankle/foot joints  Right Foot: normal exam, no swelling, tenderness, instability; ligaments intact, full range of motion of all ankle/foot joints   Assessment:   1. Pain due to onychomycosis of toenails of both feet   2. Diabetic peripheral neuropathy associated with type 2 diabetes mellitus ([place])      Plan:  Patient was evaluated and treated and all questions answered.  Onychomycosis, Diabetes and DPN -Patient is diabetic with a qualifying condition for at risk foot care.  Procedure: Nail Debridement Rationale: Patient meets criteria for routine foot care due to DPN Type of Debridement: manual, sharp debridement. Instrumentation: Nail nipper, rotary burr. Number of Nails: 10  Return in about 3 months (around 12/27/2019) for Diabetic Foot Care.

## 2019-11-15 ENCOUNTER — Inpatient Hospital Stay
Admission: AD | Admit: 2019-11-15 | Payer: Medicare PPO | Source: Other Acute Inpatient Hospital | Admitting: Internal Medicine

## 2019-11-15 DIAGNOSIS — D518 Other vitamin B12 deficiency anemias: Secondary | ICD-10-CM | POA: Diagnosis not present

## 2019-11-15 DIAGNOSIS — E785 Hyperlipidemia, unspecified: Secondary | ICD-10-CM | POA: Diagnosis not present

## 2019-11-15 DIAGNOSIS — F17210 Nicotine dependence, cigarettes, uncomplicated: Secondary | ICD-10-CM | POA: Diagnosis not present

## 2019-11-15 DIAGNOSIS — Z7902 Long term (current) use of antithrombotics/antiplatelets: Secondary | ICD-10-CM | POA: Diagnosis not present

## 2019-11-15 DIAGNOSIS — I700 Atherosclerosis of aorta: Secondary | ICD-10-CM | POA: Diagnosis not present

## 2019-11-15 DIAGNOSIS — K429 Umbilical hernia without obstruction or gangrene: Secondary | ICD-10-CM | POA: Diagnosis not present

## 2019-11-15 DIAGNOSIS — I4891 Unspecified atrial fibrillation: Secondary | ICD-10-CM | POA: Diagnosis not present

## 2019-11-15 DIAGNOSIS — E119 Type 2 diabetes mellitus without complications: Secondary | ICD-10-CM | POA: Diagnosis not present

## 2019-11-15 DIAGNOSIS — D649 Anemia, unspecified: Secondary | ICD-10-CM | POA: Diagnosis not present

## 2019-11-15 DIAGNOSIS — R0602 Shortness of breath: Secondary | ICD-10-CM | POA: Diagnosis not present

## 2019-11-15 DIAGNOSIS — K922 Gastrointestinal hemorrhage, unspecified: Secondary | ICD-10-CM | POA: Diagnosis not present

## 2019-11-15 DIAGNOSIS — I10 Essential (primary) hypertension: Secondary | ICD-10-CM | POA: Diagnosis not present

## 2019-11-17 DIAGNOSIS — Z8719 Personal history of other diseases of the digestive system: Secondary | ICD-10-CM | POA: Diagnosis not present

## 2019-11-17 DIAGNOSIS — R1010 Upper abdominal pain, unspecified: Secondary | ICD-10-CM | POA: Diagnosis not present

## 2019-11-17 DIAGNOSIS — D509 Iron deficiency anemia, unspecified: Secondary | ICD-10-CM | POA: Diagnosis not present

## 2019-11-17 DIAGNOSIS — Q2733 Arteriovenous malformation of digestive system vessel: Secondary | ICD-10-CM | POA: Diagnosis not present

## 2019-11-17 DIAGNOSIS — R110 Nausea: Secondary | ICD-10-CM | POA: Diagnosis not present

## 2019-11-17 DIAGNOSIS — R531 Weakness: Secondary | ICD-10-CM | POA: Diagnosis not present

## 2019-11-20 ENCOUNTER — Telehealth: Payer: Self-pay | Admitting: *Deleted

## 2019-11-20 ENCOUNTER — Other Ambulatory Visit: Payer: Self-pay | Admitting: Gastroenterology

## 2019-11-20 NOTE — Telephone Encounter (Signed)
   Primary Cardiologist: Candee Furbish, MD  Chart reviewed as part of pre-operative protocol coverage.  [name] was last seen on 09/12/2019 by Dr. Marlou Porch.  (See his full note)  Per Dr Marlou Porch: Preoperative cardiovascular risk evaluation -She may proceed with colonoscopy, Dr. Watt Climes, holding her Eliquis for 2 days prior to procedure, normal renal function.  She is of low overall cardiac risk.  Therefore, based on ACC/AHA guidelines, the patient would be at acceptable risk for the planned procedure without further cardiovascular testing.   I will route this recommendation to the requesting party via Epic fax function and remove from pre-op pool.  Please call with questions.  Daune Perch, NP 11/20/2019, 3:14 PM

## 2019-11-20 NOTE — Telephone Encounter (Signed)
   [facility] Pre-operative Risk Assessment    Request for surgical clearance:  1. What type of surgery is being performed? ENDOSCOPY  2. When is this surgery scheduled? 11/26/19   3. What type of clearance is required (medical clearance vs. Pharmacy clearance to hold med vs. Both)? BOTH  4. Are there any medications that need to be held prior to surgery and how long? ELIQUIS   5. Practice name and name of physician performing surgery? [facility]; DR. MAGOD   6. What is your office phone number [phone]    7.   What is your office fax number [phone]  8.   Anesthesia type (None, local, MAC, general) ? NOT LISTED; PROPOFOL?   Julaine Hua 11/20/2019, 3:07 PM  _________________________________________________________________   (provider comments below)

## 2019-11-22 ENCOUNTER — Other Ambulatory Visit (HOSPITAL_COMMUNITY)
Admission: RE | Admit: 2019-11-22 | Discharge: 2019-11-22 | Disposition: A | Discharge status: Home / Self Care | Payer: Medicare PPO | Source: Ambulatory Visit | Attending: Gastroenterology | Admitting: Gastroenterology

## 2019-11-22 DIAGNOSIS — Z01812 Encounter for preprocedural laboratory examination: Secondary | ICD-10-CM | POA: Insufficient documentation

## 2019-11-22 DIAGNOSIS — Z20822 Contact with and (suspected) exposure to covid-19: Secondary | ICD-10-CM | POA: Insufficient documentation

## 2019-11-22 LAB — SARS CORONAVIRUS 2 (TAT 6-24 HRS): SARS Coronavirus 2: NEGATIVE

## 2019-11-26 ENCOUNTER — Other Ambulatory Visit: Payer: Self-pay

## 2019-11-26 ENCOUNTER — Ambulatory Visit (HOSPITAL_COMMUNITY): Payer: Medicare PPO | Admitting: Certified Registered Nurse Anesthetist

## 2019-11-26 ENCOUNTER — Ambulatory Visit (HOSPITAL_COMMUNITY)
Admission: RE | Admit: 2019-11-26 | Discharge: 2019-11-26 | Disposition: A | Discharge status: Home / Self Care | Payer: Medicare PPO | Attending: Gastroenterology | Admitting: Gastroenterology

## 2019-11-26 ENCOUNTER — Encounter (HOSPITAL_COMMUNITY)
Admission: RE | Disposition: A | Discharge status: Home / Self Care | Payer: Self-pay | Source: Home / Self Care | Attending: Gastroenterology

## 2019-11-26 ENCOUNTER — Encounter (HOSPITAL_COMMUNITY): Payer: Self-pay | Admitting: Gastroenterology

## 2019-11-26 DIAGNOSIS — K219 Gastro-esophageal reflux disease without esophagitis: Secondary | ICD-10-CM | POA: Diagnosis not present

## 2019-11-26 DIAGNOSIS — R110 Nausea: Secondary | ICD-10-CM | POA: Insufficient documentation

## 2019-11-26 DIAGNOSIS — K449 Diaphragmatic hernia without obstruction or gangrene: Secondary | ICD-10-CM | POA: Diagnosis not present

## 2019-11-26 DIAGNOSIS — I10 Essential (primary) hypertension: Secondary | ICD-10-CM | POA: Insufficient documentation

## 2019-11-26 DIAGNOSIS — D500 Iron deficiency anemia secondary to blood loss (chronic): Secondary | ICD-10-CM | POA: Insufficient documentation

## 2019-11-26 DIAGNOSIS — K31819 Angiodysplasia of stomach and duodenum without bleeding: Secondary | ICD-10-CM | POA: Insufficient documentation

## 2019-11-26 DIAGNOSIS — Z7901 Long term (current) use of anticoagulants: Secondary | ICD-10-CM | POA: Insufficient documentation

## 2019-11-26 DIAGNOSIS — D649 Anemia, unspecified: Secondary | ICD-10-CM | POA: Diagnosis not present

## 2019-11-26 DIAGNOSIS — Q2733 Arteriovenous malformation of digestive system vessel: Secondary | ICD-10-CM | POA: Diagnosis not present

## 2019-11-26 DIAGNOSIS — Z79899 Other long term (current) drug therapy: Secondary | ICD-10-CM | POA: Insufficient documentation

## 2019-11-26 DIAGNOSIS — K228 Other specified diseases of esophagus: Secondary | ICD-10-CM | POA: Diagnosis not present

## 2019-11-26 DIAGNOSIS — E119 Type 2 diabetes mellitus without complications: Secondary | ICD-10-CM | POA: Insufficient documentation

## 2019-11-26 HISTORY — PX: ESOPHAGOGASTRODUODENOSCOPY (EGD) WITH PROPOFOL: SHX5813

## 2019-11-26 HISTORY — PX: GI RADIOFREQUENCY ABLATION: SHX6807

## 2019-11-26 HISTORY — PX: BIOPSY: SHX5522

## 2019-11-26 LAB — GLUCOSE, CAPILLARY
Glucose-Capillary: 155 mg/dL — ABNORMAL HIGH (ref 70–99)
Glucose-Capillary: 135 mg/dL — ABNORMAL HIGH (ref 70–99)

## 2019-11-26 SURGERY — ESOPHAGOGASTRODUODENOSCOPY (EGD) WITH PROPOFOL
Anesthesia: Monitor Anesthesia Care

## 2019-11-26 MED ORDER — SODIUM CHLORIDE 0.9 % IV SOLN: INTRAVENOUS | Status: DC

## 2019-11-26 MED ORDER — ONDANSETRON HCL 4 MG/2ML IJ SOLN
INTRAMUSCULAR | Status: DC | PRN
  Administered 2019-11-26: 4 mg via INTRAVENOUS

## 2019-11-26 MED ORDER — LIDOCAINE 2% (20 MG/ML) 5 ML SYRINGE
INTRAMUSCULAR | Status: DC | PRN
  Administered 2019-11-26: 70 mg via INTRAVENOUS

## 2019-11-26 MED ORDER — PROPOFOL 10 MG/ML IV BOLUS
INTRAVENOUS | Status: AC
  Filled 2019-11-26: qty 20

## 2019-11-26 MED ORDER — PROPOFOL 500 MG/50ML IV EMUL
INTRAVENOUS | Status: DC | PRN
  Administered 2019-11-26: 125 ug/kg/min via INTRAVENOUS

## 2019-11-26 MED ORDER — LACTATED RINGERS IV SOLN
INTRAVENOUS | Status: AC | PRN
  Administered 2019-11-26: 1000 mL via INTRAVENOUS

## 2019-11-26 MED ORDER — PROPOFOL 10 MG/ML IV BOLUS
INTRAVENOUS | Status: DC | PRN
  Administered 2019-11-26: 10 mg via INTRAVENOUS
  Administered 2019-11-26: 20 mg via INTRAVENOUS

## 2019-11-26 SURGICAL SUPPLY — 14 items

## 2019-11-26 NOTE — Op Note (Signed)
[facility] Patient Name: [name] Procedure Date: 11/26/2019 MRN: [id number] Attending MD: Clarene Essex , MD Date of Birth: [birth year] CSN: [id number] Age: 79 Admit Type: Outpatient Procedure:                Upper GI endoscopy Indications:              Iron deficiency anemia secondary to chronic blood                            loss, Nausea Providers:                Clarene Essex, MD, Benetta Spar RN, RN, Laverda Sorenson, Technician, Adair Laundry, CRNA Referring MD:              Medicines:                Propofol total dose 130 mg IV,70 mg lidocaine Complications:            No immediate complications. Estimated Blood Loss:     Estimated blood loss: none. Procedure:                Pre-Anesthesia Assessment:                           - Prior to the procedure, a History and Physical                            was performed, and patient medications and                            allergies were reviewed. The patient's tolerance of                            previous anesthesia was also reviewed. The risks                            and benefits of the procedure and the sedation                            options and risks were discussed with the patient.                            All questions were answered, and informed consent                            was obtained. Prior Anticoagulants: The patient has                            taken Eliquis (apixaban), last dose was 2 days                            prior to procedure. ASA Grade Assessment: III - A  patient with severe systemic disease. After                            reviewing the risks and benefits, the patient was                            deemed in satisfactory condition to undergo the                            procedure.                           After obtaining informed consent, the endoscope was                            passed under direct vision.  Throughout the                            procedure, the patient's blood pressure, pulse, and                            oxygen saturations were monitored continuously. The                            [id number] Olympus gastroscope was                            introduced through the mouth, and advanced to the                            third part of duodenum. The upper GI endoscopy was                            accomplished without difficulty. The patient                            tolerated the procedure well. Scope In: Scope Out: Findings:      The larynx was normal.      A small hiatal hernia was present.      There were ?esophageal mucosal changes suspicious for very short-segment       Barrett's esophagus present in the lower third of the esophagus.       Biopsies were taken with a cold forceps for histology.      Mild gastric antral vascular ectasia without bleeding was present in the       gastric antrum. Focal radiofrequency ablation of gastric antral vascular       ectasia in the gastric antrum was performed. Ablation sites were located       at 35 cm from the incisors. With the endoscope in place, the position       and extent of the abnormal mucosa and appropriate anatomic landmarks       were noted. The radiofrequency channel ablation catheter was introduced       through the endoscope working channel. The endoscope with the ablation       catheter was advanced to the areas of  abnormal mucosa. The endoscope       with the channel ablation catheter was positioned under direct       visualization so that the catheter was placed in contact with the       surface of the abnormal mucosa. Energy was applied twice at 12 J/cm2.       Ablation was repeated in a likewise fashion to all visible abnormal       mucosa. The ablation catheter was removed through the endoscope working       channel. The areas where abnormal mucosa had been ablated were examined.       Areas of  abnormal mucosa appeared completely ablated.      The duodenal bulb, first portion of the duodenum, second portion of the       duodenum and third portion of the duodenum were normal.      The exam was otherwise without abnormality. Impression:               - Normal larynx.                           - Small hiatal hernia.                           -? Esophageal mucosal changes suspicious for very                            short-segment Barrett's esophagus. Biopsied.                           - Gastric antral vascular ectasia without bleeding.                            Treated with radiofrequency ablation.                           - Normal duodenal bulb, first portion of the                            duodenum, second portion of the duodenum and third                            portion of the duodenum.                           - The examination was otherwise normal. Moderate Sedation:      Not Applicable - Patient had care per Anesthesia. Recommendation:           - Patient has a contact number available for                            emergencies. The signs and symptoms of potential                            delayed complications were discussed with the                            patient. Return to  normal activities tomorrow.                            Written discharge instructions were provided to the                            patient.                           - Soft diet today.                           - Resume Eliquis (apixaban) at prior dose tomorrow.                           - Return to GI clinic PRN.                           - Telephone GI clinic if symptomatic PRN.                           - Check hemogram with white blood cell count and                            platelets in 2 weeks.                           - Await pathology results.                           - Telephone GI clinic for pathology results in 1                            week. Procedure Code(s):         --- Professional ---                           [phone], 31, Esophagogastroduodenoscopy, flexible,                            transoral; with control of bleeding, any method                           43239, Esophagogastroduodenoscopy, flexible,                            transoral; with biopsy, single or multiple Diagnosis Code(s):        --- Professional ---                           K22.8, Other specified diseases of esophagus                           K44.9, Diaphragmatic hernia without obstruction or                            gangrene  K31.819, Angiodysplasia of stomach and duodenum                            without bleeding                           D50.0, Iron deficiency 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 All rights reserved. The codes documented in this report are preliminary and upon coder review may  be revised to meet current compliance requirements. Clarene Essex, MD 11/26/2019 2:45:36 PM This report has been signed electronically. Number of Addenda: 0

## 2019-11-26 NOTE — Discharge Instructions (Signed)
YOU HAD AN ENDOSCOPIC PROCEDURE TODAY: Refer to the procedure report and other information in the discharge instructions given to you for any specific questions about what was found during the examination. If this information does not answer your questions, please call [facility] office at [phone] to clarify.   YOU SHOULD EXPECT: Some feelings of bloating in the abdomen. Passage of more gas than usual. Walking can help get rid of the air that was put into your GI tract during the procedure and reduce the bloating. If you had a lower endoscopy (such as a colonoscopy or flexible sigmoidoscopy) you may notice spotting of blood in your stool or on the toilet paper. Some abdominal soreness may be present for a day or two, also.  DIET: Your first meal following the procedure should be a light meal and then it is ok to progress to your normal diet. A half-sandwich or bowl of soup is an example of a good first meal. Heavy or fried foods are harder to digest and may make you feel nauseous or bloated. Drink plenty of fluids but you should avoid alcoholic beverages for 24 hours. If you had a esophageal dilation, please see attached instructions for diet.    ACTIVITY: Your care partner should take you home directly after the procedure. You should plan to take it easy, moving slowly for the rest of the day. You can resume normal activity the day after the procedure however YOU SHOULD NOT DRIVE, use power tools, machinery or perform tasks that involve climbing or major physical exertion for 24 hours (because of the sedation medicines used during the test).   SYMPTOMS TO REPORT IMMEDIATELY: A gastroenterologist can be reached at any hour. Please call [phone]  for any of the following symptoms:  . Following lower endoscopy (colonoscopy, flexible sigmoidoscopy) Excessive amounts of blood in the stool  Significant tenderness, worsening of abdominal pains  Swelling of the abdomen that is new, acute  Fever of 100  or higher  . Following upper endoscopy (EGD, EUS, ERCP, esophageal dilation) Vomiting of blood or coffee ground material  New, significant abdominal pain  New, significant chest pain or pain under the shoulder blades  Painful or persistently difficult swallowing  New shortness of breath  Black, tarry-looking or red, bloody stools  FOLLOW UP:  If any biopsies were taken you will be contacted by phone or by letter within the next 1-3 weeks. Call [phone]  if you have not heard about the biopsies in 3 weeks.  Please also call with any specific questions about appointments or follow up tests. Soft solids today call if question or problem otherwise call for biopsy report in 1 week and will set up CBC in 2 weeks and then decide follow-up based on above and if doing well may restart your Eliquis tomorrow

## 2019-11-26 NOTE — Progress Notes (Signed)
[name] 1:57 PM  Subjective: Patient does not have any signs of bleeding and no new complaints since she was seen in the office last week by my PA and her nausea still bothersome but Effexor helps her nerves and depression and she is not on any other new medicine and she is doing better on her iron and feeling better overall this week  Objective: Vital signs stable afebrile exam please see preassessment evaluation  Assessment: Gave with recurrent anemia  Plan: Okay to proceed with endoscopy with anesthesia assistance  [facility]  office [phone] After 5PM or if no answer call [phone]

## 2019-11-26 NOTE — Anesthesia Preprocedure Evaluation (Addendum)
Anesthesia Evaluation  Patient identified by MRN, date of birth, ID band Patient awake    Reviewed: Allergy & Precautions, NPO status , Patient's Chart, lab work & pertinent test results  History of Anesthesia Complications (+) PONV and history of anesthetic complications  Airway Mallampati: II  TM Distance: >3 FB Neck ROM: Full    Dental no notable dental hx.    Pulmonary neg pulmonary ROS,    Pulmonary exam normal breath sounds clear to auscultation       Cardiovascular hypertension, Normal cardiovascular exam Rhythm:Regular Rate:Normal     Neuro/Psych negative neurological ROS  negative psych ROS   GI/Hepatic Neg liver ROS, hiatal hernia, GERD  ,  Endo/Other  diabetes, Insulin Dependent  Renal/GU negative Renal ROS  negative genitourinary   Musculoskeletal negative musculoskeletal ROS (+)   Abdominal   Peds negative pediatric ROS (+)  Hematology  (+) Blood dyscrasia, anemia ,   Anesthesia Other Findings   Reproductive/Obstetrics negative OB ROS                             Anesthesia Physical  Anesthesia Plan  ASA: III  Anesthesia Plan: MAC   Post-op Pain Management:    Induction: Intravenous  PONV Risk Score and Plan: 0 and Ondansetron  Airway Management Planned: Simple Face Mask, Nasal Cannula and Mask  Additional Equipment:   Intra-op Plan:   Post-operative Plan:   Informed Consent: I have reviewed the patients History and Physical, chart, labs and discussed the procedure including the risks, benefits and alternatives for the proposed anesthesia with the patient or authorized representative who has indicated his/her understanding and acceptance.     Dental advisory given  Plan Discussed with: CRNA, Surgeon and Anesthesiologist  Anesthesia Plan Comments:        Anesthesia Quick Evaluation

## 2019-11-26 NOTE — Anesthesia Postprocedure Evaluation (Signed)
Anesthesia Post Note  Patient: [name]  Procedure(s) Performed: ESOPHAGOGASTRODUODENOSCOPY (EGD) WITH PROPOFOL (N/A ) GI RADIOFREQUENCY ABLATION (N/A ) BIOPSY     Patient location during evaluation: PACU Anesthesia Type: MAC Level of consciousness: awake and alert Pain management: pain level controlled Vital Signs Assessment: post-procedure vital signs reviewed and stable Respiratory status: spontaneous breathing, nonlabored ventilation, respiratory function stable and patient connected to nasal cannula oxygen Cardiovascular status: stable and blood pressure returned to baseline Postop Assessment: no apparent nausea or vomiting Anesthetic complications: no    Last Vitals:  Vitals:   11/26/19 1500 11/26/19 1510  BP: (!) 166/54   Pulse: 63   Resp: 15 18  Temp:    SpO2: 99%     Last Pain:  Vitals:   11/26/19 1500  TempSrc:   PainSc: 0-No pain                 Elbridge Magowan

## 2019-11-26 NOTE — Transfer of Care (Signed)
Immediate Anesthesia Transfer of Care Note  Patient: [name]  Procedure(s) Performed: ESOPHAGOGASTRODUODENOSCOPY (EGD) WITH PROPOFOL (N/A ) GI RADIOFREQUENCY ABLATION (N/A ) BIOPSY  Patient Location: Endoscopy Unit  Anesthesia Type:MAC  Level of Consciousness: drowsy and patient cooperative  Airway & Oxygen Therapy: Patient Spontanous Breathing and Patient connected to nasal cannula oxygen  Post-op Assessment: Report given to RN and Post -op Vital signs reviewed and stable  Post vital signs: Reviewed and stable  Last Vitals:  Vitals Value Taken Time  BP 137/54 11/26/19 1441  Temp    Pulse 62 11/26/19 1443  Resp 17 11/26/19 1443  SpO2 100 % 11/26/19 1443  Vitals shown include unvalidated device data.  Last Pain:  Vitals:   11/26/19 1330  TempSrc: Oral         Complications: No apparent anesthesia complications

## 2019-11-27 ENCOUNTER — Encounter: Payer: Self-pay | Admitting: *Deleted

## 2019-11-27 LAB — SURGICAL PATHOLOGY

## 2019-12-15 DIAGNOSIS — D649 Anemia, unspecified: Secondary | ICD-10-CM | POA: Diagnosis not present

## 2019-12-15 DIAGNOSIS — N183 Chronic kidney disease, stage 3 unspecified: Secondary | ICD-10-CM | POA: Diagnosis not present

## 2019-12-15 DIAGNOSIS — Z8719 Personal history of other diseases of the digestive system: Secondary | ICD-10-CM | POA: Diagnosis not present

## 2019-12-15 DIAGNOSIS — E782 Mixed hyperlipidemia: Secondary | ICD-10-CM | POA: Diagnosis not present

## 2019-12-16 DIAGNOSIS — M546 Pain in thoracic spine: Secondary | ICD-10-CM | POA: Insufficient documentation

## 2019-12-16 DIAGNOSIS — M545 Low back pain: Secondary | ICD-10-CM | POA: Diagnosis not present

## 2019-12-17 ENCOUNTER — Other Ambulatory Visit: Payer: Self-pay | Admitting: Orthopedic Surgery

## 2019-12-17 DIAGNOSIS — M546 Pain in thoracic spine: Secondary | ICD-10-CM

## 2019-12-29 ENCOUNTER — Other Ambulatory Visit: Payer: Self-pay

## 2019-12-29 ENCOUNTER — Ambulatory Visit (INDEPENDENT_AMBULATORY_CARE_PROVIDER_SITE_OTHER): Payer: Medicare PPO | Admitting: Podiatry

## 2019-12-29 DIAGNOSIS — B351 Tinea unguium: Secondary | ICD-10-CM

## 2019-12-29 DIAGNOSIS — E1142 Type 2 diabetes mellitus with diabetic polyneuropathy: Secondary | ICD-10-CM

## 2019-12-29 DIAGNOSIS — E1169 Type 2 diabetes mellitus with other specified complication: Secondary | ICD-10-CM

## 2019-12-30 ENCOUNTER — Other Ambulatory Visit: Payer: Self-pay | Admitting: Cardiology

## 2019-12-30 NOTE — Telephone Encounter (Signed)
Eliquis 5mg  refill request received. Patient is 79 years old, weight-71.7kg, Crea-1.16 on 12/15/2019 via [place] [facility] [place] PCP, Diagnosis-Afib, and last seen by Dr. Marlou Porch on 09/12/2019. Dose is appropriate based on dosing criteria. Will send in refill to requested pharmacy.

## 2020-01-01 ENCOUNTER — Ambulatory Visit
Admission: RE | Admit: 2020-01-01 | Discharge: 2020-01-01 | Disposition: A | Discharge status: Home / Self Care | Payer: Medicare PPO | Source: Ambulatory Visit | Attending: Orthopedic Surgery | Admitting: Orthopedic Surgery

## 2020-01-01 DIAGNOSIS — M546 Pain in thoracic spine: Secondary | ICD-10-CM

## 2020-01-01 DIAGNOSIS — S22060A Wedge compression fracture of T7-T8 vertebra, initial encounter for closed fracture: Secondary | ICD-10-CM | POA: Diagnosis not present

## 2020-01-03 DIAGNOSIS — E1142 Type 2 diabetes mellitus with diabetic polyneuropathy: Secondary | ICD-10-CM | POA: Diagnosis not present

## 2020-01-05 DIAGNOSIS — M4854XA Collapsed vertebra, not elsewhere classified, thoracic region, initial encounter for fracture: Secondary | ICD-10-CM | POA: Diagnosis not present

## 2020-01-05 DIAGNOSIS — M546 Pain in thoracic spine: Secondary | ICD-10-CM | POA: Diagnosis not present

## 2020-01-06 ENCOUNTER — Telehealth: Payer: Self-pay | Admitting: Physician Assistant

## 2020-01-06 ENCOUNTER — Telehealth: Payer: Self-pay | Admitting: *Deleted

## 2020-01-06 DIAGNOSIS — I480 Paroxysmal atrial fibrillation: Secondary | ICD-10-CM

## 2020-01-06 NOTE — Telephone Encounter (Signed)
   [facility] Pre-operative Risk Assessment    Request for surgical clearance:  1. What type of surgery is being performed? T8 KYPHOPLASTY   2. When is this surgery scheduled? TBD   3. What type of clearance is required (medical clearance vs. Pharmacy clearance to hold med vs. Both)? BOTH  4. Are there any medications that need to be held prior to surgery and how long? ELIQUIS   5. Practice name and name of physician performing surgery? [facility]; DR. [place]   6. What is your office phone number [phone]    7.   What is your office fax number [phone] ATTN: SHERRY WILLS  8.   Anesthesia type (None, local, MAC, general) ? GENERAL? I TRIED TO REACH OUT TO [facility] THOUGH NO ONE PICKED UP THE LINE.    [name] 01/06/2020, 1:44 PM  _________________________________________________________________   (provider comments below)

## 2020-01-06 NOTE — Telephone Encounter (Signed)
   Primary Cardiologist: Candee Furbish, MD  Chart reviewed as part of pre-operative protocol coverage. Patient was contacted 01/06/2020 in reference to pre-operative risk assessment for pending surgery as outlined below.  [name] was last seen on Dr. Marlou Porch on 09/12/19 with history of PAF, HTN, DM, neuropathy, GIB, anxiety, asthma, arthritis, CKD III, hiatal hernia, IDA, PVD, pulmonary nodule. No prior echo on file but no hx of CHF noted in chart. RCRI 0.9% indicating low risk of CV complications. At last OV 09/12/19, was cleared for colonoscopy. I spoke with the patient and she states she is doing well from heart standpoint. She does not formally exercise but gets a lot of activity walking at [place], laundry, dusting and general ADLs without angina or dyspnea. Therefore, based on ACC/AHA guidelines, the patient would be at acceptable risk for the planned procedure without further cardiovascular testing.   I will route to pharmacy for input on holding Eliquis. Last labs by [facility] 11/2019 by [facility] showed Cr 1.160, Hgb 12.0, Hct 37.1.  Of note patient also wanted to ask while she had me on the phone whether there was something that she could take different than Eliquis given her bleeding ulcer. She states she's had one 3-4 times over the years requiring a scope and that Dr. Watt Climes told her that as long as she was on Eliquis this would happen. This is outside scope of pre-op team so will forward to Dr. Marlou Porch in separate phone note to address.  Charlie Pitter, PA-C 01/06/2020, 4:30 PM

## 2020-01-06 NOTE — Telephone Encounter (Signed)
   Patient was contacted today for pre-op clearance and reported she was doing well (separate phone note). I medically cleared her for her kyphoplasty; pre-op team will fax clearance when we hear back from pharm regarding her Eliquis.  Of note patient also wanted to ask while she had me on the phone whether there was something that she could take different than Eliquis given her bleeding ulcer. She states she's had one 3-4 times over the years requiring scopes and that Dr. Watt Climes told her that as long as she was on Eliquis this would happen. She was wondering if there is something else she can take.  Last EGD report 11/26/19: "Normal larynx. - Small hiatal hernia. -? Esophageal mucosal changes suspicious for very short-segment Barrett's esophagus. Biopsied. - Gastric antral vascular ectasia without bleeding. Treated with radiofrequency ablation. - Normal duodenal bulb, first portion of the duodenum, second portion of the duodenum and third portion of the duodenum. - The examination was otherwise normal."  Last labs by [facility] 11/2019 by [facility] showed Cr 1.160, Hgb 12.0, Hct 37.1. She denies any active BRBPR or hematemesis. She has chronic black stools due to iron, no change. She denies any active ulcer/gastritis symptoms but just wondering in general.  This is outside scope of pre-op team so will forward to Dr. Marlou Porch in this separate phone note to address. Dawit Tankard PA-C

## 2020-01-07 ENCOUNTER — Telehealth: Payer: Self-pay | Admitting: Radiology

## 2020-01-07 NOTE — Telephone Encounter (Signed)
Enrolled patient for a 14 day Zio monitor to be mailed to patients home.  

## 2020-01-07 NOTE — Telephone Encounter (Signed)
Given no recent evidence of atrial fibrillation, let's place a ZIO patch long term 14 day monitor.  If no atrial fib detected, I would be open to stopping the Eliquis.  Candee Furbish, MD

## 2020-01-07 NOTE — Telephone Encounter (Signed)
Patient with diagnosis of afib on Eliquis for anticoagulation.    Procedure: T8 KYPHOPLASTY  Date of procedure: TBD  CHADS2-VASc score of  6 (HTN, AGE, DM2, CAD, AGE, female)  CrCl 38 ml/min  Per office protocol, patient can hold Eliquis for 3 days prior to procedure.

## 2020-01-07 NOTE — Telephone Encounter (Signed)
Pt is aware and agreeable to wearing ZIO monitor for 14 days.  Order has been placed and pt aware she will receive it in the mail with all instructions for use.  Zio instructions to be sent to pt via MyChart as well.

## 2020-01-08 DIAGNOSIS — D649 Anemia, unspecified: Secondary | ICD-10-CM | POA: Diagnosis not present

## 2020-01-08 DIAGNOSIS — E782 Mixed hyperlipidemia: Secondary | ICD-10-CM | POA: Diagnosis not present

## 2020-01-09 ENCOUNTER — Ambulatory Visit: Payer: Self-pay | Admitting: Orthopedic Surgery

## 2020-01-12 ENCOUNTER — Other Ambulatory Visit (INDEPENDENT_AMBULATORY_CARE_PROVIDER_SITE_OTHER): Payer: Medicare PPO

## 2020-01-12 DIAGNOSIS — I480 Paroxysmal atrial fibrillation: Secondary | ICD-10-CM

## 2020-01-19 ENCOUNTER — Ambulatory Visit: Payer: Self-pay | Admitting: Orthopedic Surgery

## 2020-01-19 DIAGNOSIS — Z01818 Encounter for other preprocedural examination: Secondary | ICD-10-CM | POA: Diagnosis not present

## 2020-01-19 NOTE — H&P (Signed)
Subjective:   [name] is a pleasant 79 year old female with medical history significant for heart problems (Eliquis), diabetes (last hemoglobin A1c was 5.9), recent stomach ulcers causing bleeding and anemia which required a blood transfusion and surgical intervention who also has a spinal cord stimulator with the leads just under a recent T8 compression fracture. Her chief complaint continues to be midline thoracic pain secondary to the compression fracture. She would like to move forward with a T8 Kyphoplasty on 01/29/20 With Dr. Rolena Infante at [facility].  Patient Active Problem List   Diagnosis Date Noted  . S/P left TKA 11/05/2017  . S/P total knee replacement 11/05/2017  . Diabetes mellitus ([place]) 12/25/2016  . Hypercholesterolemia 12/25/2016  . Ptosis of eyelid 10/09/2016  . Iron deficiency anemia 12/15/2015  . Acute GI bleeding 05/13/2015  . Acute blood loss anemia 05/13/2015  . Normocytic anemia 05/13/2015  . Allergic rhinitis 04/06/2015  . Asthma, chronic 09/08/2013  . Hypoxia 09/08/2013  . HTN (hypertension) 09/08/2013  . Depression 09/08/2013  . DM type 2 with diabetic peripheral neuropathy ([place]) 09/08/2013  . Flu-like symptoms 09/07/2013   Past Medical History:  Diagnosis Date  . Acute GI bleeding 05/13/2015  . Anxiety   . Arthritis   . Asthma   . Atrial fibrillation ([employer]) 10/29/2017  . Bakers cyst, left   . Chronic kidney disease    stage 3  . Constipation   . Diabetes mellitus    type 2  . GERD (gastroesophageal reflux disease)   . Heart murmur   . History of cellulitis   . History of dizziness   . History of hiatal hernia    tiny 06/15/2016  . Hypertension   . Iron deficiency anemia   . Peripheral vascular disease ([place])   . PONV (postoperative nausea and vomiting)   . Pulmonary nodule     Past Surgical History:  Procedure Laterality Date  . APPENDECTOMY  1952  . BIOPSY  10/16/2018   Procedure: BIOPSY;  Surgeon: Clarene Essex, MD;  Location: [facility];   Service: Endoscopy;;  . BIOPSY  11/26/2019   Procedure: BIOPSY;  Surgeon: Clarene Essex, MD;  Location: [facility];  Service: Endoscopy;;  . COLONOSCOPY W/ POLYPECTOMY  05/15/2013  . ESOPHAGOGASTRODUODENOSCOPY (EGD) WITH PROPOFOL N/A 06/15/2016   Procedure: ESOPHAGOGASTRODUODENOSCOPY (EGD) WITH PROPOFOL;  Surgeon: Clarene Essex, MD;  Location: [facility];  Service: Endoscopy;  Laterality: N/A;  . ESOPHAGOGASTRODUODENOSCOPY (EGD) WITH PROPOFOL N/A 10/16/2018   Procedure: ESOPHAGOGASTRODUODENOSCOPY (EGD) WITH PROPOFOL;  Surgeon: Clarene Essex, MD;  Location: [facility];  Service: Endoscopy;  Laterality: N/A;  . ESOPHAGOGASTRODUODENOSCOPY (EGD) WITH PROPOFOL N/A 11/26/2019   Procedure: ESOPHAGOGASTRODUODENOSCOPY (EGD) WITH PROPOFOL;  Surgeon: Clarene Essex, MD;  Location: [facility];  Service: Endoscopy;  Laterality: N/A;  . EYE SURGERY    . GI RADIOFREQUENCY ABLATION  10/16/2018   Procedure: GI RADIOFREQUENCY ABLATION;  Surgeon: Clarene Essex, MD;  Location: [facility];  Service: Endoscopy;;  . GI RADIOFREQUENCY ABLATION N/A 11/26/2019   Procedure: GI RADIOFREQUENCY ABLATION;  Surgeon: Clarene Essex, MD;  Location: [facility];  Service: Endoscopy;  Laterality: N/A;  . HAND SURGERY Left 2010   operated on index and ring finger  . INCONTINENCE SURGERY    . JOINT REPLACEMENT Right    knee  . KNEE ARTHROSCOPY  2003 2005   right knee  . mass fallopian tube    . SPINAL CORD STIMULATOR BATTERY EXCHANGE N/A 09/09/2015   Procedure: SPINAL CORD STIMULATOR BATTERY EXCHANGE;  Surgeon: Melina Schools, MD;  Location:  [facility];  Service: Orthopedics;  Laterality: N/A;  . TOTAL KNEE ARTHROPLASTY Left 11/05/2017   Procedure: LEFT TOTAL KNEE ARTHROPLASTY;  Surgeon: Paralee Cancel, MD;  Location: [facility];  Service: Orthopedics;  Laterality: Left;  Adductor Block  . VAGINAL HYSTERECTOMY  1977   1 ovary removed    Current Outpatient Medications  Medication Sig Dispense Refill Last Dose  . albuterol (PROAIR HFA) 108 (90  BASE) MCG/ACT inhaler Inhale 2 puffs into the lungs every 4 (four) hours as needed for wheezing or shortness of breath. 1 Inhaler 3   . amLODipine (NORVASC) 10 MG tablet Take 10 mg by mouth daily.     Marland Kitchen docusate sodium (COLACE) 100 MG capsule Take 1 capsule (100 mg total) by mouth 2 (two) times daily. (Patient taking differently: Take 100 mg by mouth daily. ) 10 capsule 0   . ELIQUIS 5 MG TABS tablet TAKE 1 TABLET TWICE DAILY (Patient taking differently: Take 5 mg by mouth in the morning and at bedtime. ) 180 tablet 2   . esomeprazole (NEXIUM) 40 MG capsule Take 40 mg by mouth in the morning and at bedtime. Morning & afternoon.  0   . gabapentin (NEURONTIN) 300 MG capsule Take 600 mg by mouth in the morning, at noon, and at bedtime.      . Insulin Glargine (LANTUS SOLOSTAR) 100 UNIT/ML Solostar Pen Inject 32 Units into the skin at bedtime.      . Iron-FA-B Cmp-C-Biot-Probiotic (FUSION PLUS) CAPS Take 1 capsule by mouth every evening.      Marland Kitchen losartan (COZAAR) 100 MG tablet Take 100 mg by mouth daily.     . metoprolol (LOPRESSOR) 50 MG tablet Take 75-100 mg by mouth See admin instructions. Take 2 tablets (100 mg) by mouth in the morning & take 1.5 tablets (75 mg) by mouth in the evening.     . Multiple Vitamins-Minerals (PRESERVISION AREDS 2 PO) Take 1 capsule by mouth 2 (two) times daily.      Marland Kitchen NOVOLOG FLEXPEN 100 UNIT/ML FlexPen Inject 12-16 Units into the skin in the morning, at noon, and at bedtime. Sliding scale insulin     . Pitavastatin Calcium (LIVALO) 1 MG TABS Take 1 mg by mouth at bedtime.     . polyethylene glycol (MIRALAX / GLYCOLAX) packet Take 17 g by mouth 2 (two) times daily. (Patient taking differently: Take 17 g by mouth daily as needed for moderate constipation. ) 14 each 0   . sucralfate (CARAFATE) 1 g tablet Take 1 g by mouth in the morning and at bedtime.     . triamterene-hydrochlorothiazide (MAXZIDE-25) 37.5-25 MG tablet Take 2 capsules by mouth daily.      Marland Kitchen venlafaxine  (EFFEXOR) 37.5 MG tablet Take 37.5 mg by mouth 2 (two) times daily.      No current facility-administered medications for this visit.   Allergies  Allergen Reactions  . Vasotec Shortness Of Breath and Other (See Comments)    wheezing  . Atorvastatin Other (See Comments)    Leg cramps   . Codeine Nausea And Vomiting  . Cymbalta [Duloxetine Hcl] Itching  . Lansoprazole Itching, Swelling, Rash and Other (See Comments)    Redness, swelling of mouth   . Morphine And Related Itching  . Sulfate Itching  . Adhesive [Tape] Rash  . Scopolamine Rash    Social History   Tobacco Use  . Smoking status: Never Smoker  . Smokeless tobacco: Never Used  Substance Use Topics  . Alcohol use:  No    Family History  Problem Relation Age of Onset  . Lung cancer Mother   . Heart attack Father   . Colon cancer Neg Hx   . Stroke Neg Hx   . Migraines Neg Hx     Review of Systems As stated in HPI  Objective:   Vitals: Ht: 5 ft 4 in 01/19/2020 01:30 pm Wt: 152 lbs 01/19/2020 01:30 pm BMI: 26.1 01/19/2020 01:30 pm BP: 123/76 01/19/2020 01:34 pm Pulse: 73 bpm 01/19/2020 01:34 pm T: 98.6 F 01/19/2020 01:34 pm Pain Scale: 7 01/19/2020 01:34 pm   Clinical exam: [name] is a pleasant individual, who appears younger than their stated age. She is alert and orientated 3. No shortness of breath, chest pain.  Negative: skin lesions abrasions contusions  Heart: Regular rate and rhythm, no rubs, murmurs, or gallops patient does currently have a heart monitor which she is wearing to assess her heart rhythm because due to the recent ulcers and bleeding they would like to discontinue her Eliquis if it is safe. She will be having this removed in the next few days.  Lungs: Clear to auscultation bilaterally  Abdomen: Bowel sounds 4, nondistended, nontender, no rebound tenderness. No loss of bladder or bowel control.  Peripheral pulses: 2+ dorsalis pedis/posterior tibialis pulses bilaterally.  Compartment soft and nontender.  Gait pattern: Normal  Assistive devices: None  Neuro: No focal motor or sensory deficits in the lower extremity. Negative nerve root tension signs. Negative Babinski test, no clonus.  Musculoskeletal: Significant bit thoracic pain with direct palpation. Pain radiates into the paraspinal region. No pain radiating around the flank or anterior chest. No significant low back pain.  CT scan of the lumbar spine completed on 01/01/20 at [facility] was reviewed. I agree with the radiology report. Acute/subacute superior endplate compression fracture of T8 with minimal compression deformity no retropulsion. No other fractures are noted. Spinal cord stimulator is in place without complicating features.  X-rays of the lumbar spine were taken at today's office visit and compared to previous x-rays. There is been no significant change/collapse to the fracture level  Assessment:   [name] is a very pleasant 79 year old with medical history significant for heart problems (Eliquis), diabetes (last hemoglobin A1c was 5.9), recent stomach ulcers causing bleeding and anemia which required a blood transfusion and surgical intervention who also has a spinal cord stimulator who Was in her normal state of health until she fell at church about 2 months ago and has had persistent mid thoracic pain. CT scan demonstrated a T8 compression fracture. The spinal cord stimulator battery was recently interrogated and it was functioning fine. There is no lead issues seen on the CT scan. At this point time her pain is severe and is affecting her quality-of-life. We have had a long discussion about surgery and she would like to move forward with a T8 kyphoplasty which I think is quite reasonable. I have gone over the risks and benefits of surgery with the patient and her husband and all of their questions were encouraged and addressed.  Plan:   Risks of surgery include: Infection, bleeding,  death, stroke, paralysis, nerve damage, leak of cement, need for additional surgery including open decompression. Ongoing or worse pain.  Goals of surgery: Reduction in pain, and improvement in quality of life   We have obtained preoperative medical clearance from her cardiologist states it is safe to hold the Eliquis 3 days 2 days postoperatively. We are still awaiting clearance from the patient's  primary care provider who she is scheduled to see in the next few days.  The patient is on Eliquis and we have discussed holding this medication 3 days prior to surgery and resuming surgery. She is not using any anti-inflammatory medicine.  We have also discussed the post-operative recovery period to include: bathing/showering restrictions, wound healing, activity (and driving) restrictions, medications/pain mangement.  We have also discussed post-operative redflags to include: signs and symptoms of postoperative infection, DVT/PE.  She has her appointment scheduled for preop testing at [facility].  All patients questions were invited and answered.  Plan is to move forward with surgical intervention as scheduled pending preoperative tests at [facility].

## 2020-01-20 DIAGNOSIS — Z01818 Encounter for other preprocedural examination: Secondary | ICD-10-CM | POA: Diagnosis not present

## 2020-01-20 DIAGNOSIS — F419 Anxiety disorder, unspecified: Secondary | ICD-10-CM | POA: Diagnosis not present

## 2020-01-20 DIAGNOSIS — F339 Major depressive disorder, recurrent, unspecified: Secondary | ICD-10-CM | POA: Diagnosis not present

## 2020-01-20 DIAGNOSIS — Z8719 Personal history of other diseases of the digestive system: Secondary | ICD-10-CM | POA: Diagnosis not present

## 2020-01-20 DIAGNOSIS — I10 Essential (primary) hypertension: Secondary | ICD-10-CM | POA: Diagnosis not present

## 2020-01-23 NOTE — Progress Notes (Signed)
[id number] - [place], [place] - [address] [place] [address] Phone: [phone] Fax: [phone]  [place], [place] [address] [place] [address] Phone: [phone] Fax: [facility] [place], [place] [address] Phone: [phone] Fax: [phone]      Your procedure is scheduled on Thursday, 01/29/2020.  Report to [facility] Main Entrance "A" at 05:30 A.M., and check in at the Admitting office.  Call this number if you have problems the morning of surgery:  [phone]  Call [phone] if you have any questions prior to your surgery date Monday-Friday 8am-4pm    Remember:  Do not eat or drink after midnight the night before your surgery     Take these medicines the morning of surgery with A SIP OF WATER: Albuterol (Proair) inhaler - if needed Amlodipine (Norvasc) Esomeprazole (Nexium) Gabapentin (Neurontin)  Metoprolol (Lopressor) Sucralfate (Carafate) Venlafaxine (Effexor)  As of today, STOP taking any Aspirin (unless otherwise instructed by your surgeon) and Aspirin containing products, Aleve, Naproxen, Ibuprofen, Motrin, Advil, Goody's, BC's, all herbal medications, fish oil, and all vitamins.  Follow your surgeon's instructions on when to stop Eliquis.  If no instructions were given by your surgeon then you will need to call the office to get those instructions.     WHAT DO I DO ABOUT MY DIABETES MEDICATION?  Marland Kitchen Do not take oral diabetes medicines (pills) the morning of surgery.  . THE NIGHT BEFORE SURGERY, take  16 units of ___________insulin.     . THE MORNING OF SURGERY, do NOT take your Novolog unless you CBG is greater than 220 mg/dL, then you may take  of your sliding scale (correction) dose of insulin.   HOW TO MANAGE YOUR DIABETES BEFORE AND AFTER SURGERY  Why is it  important to control my blood sugar before and after surgery? . Improving blood sugar levels before and after surgery helps healing and can limit problems. . A way of improving blood sugar control is eating a healthy diet by: o  Eating less sugar and carbohydrates o  Increasing activity/exercise o  Talking with your doctor about reaching your blood sugar goals . High blood sugars (greater than 180 mg/dL) can raise your risk of infections and slow your recovery, so you will need to focus on controlling your diabetes during the weeks before surgery. . Make sure that the doctor who takes care of your diabetes knows about your planned surgery including the date and location.  How do I manage my blood sugar before surgery? . Check your blood sugar at least 4 times a day, starting 2 days before surgery, to make sure that the level is not too high or low. . Check your blood sugar the morning of your surgery when you wake up and every 2 hours until you get to the Short Stay unit. o If your blood sugar is less than 70 mg/dL, you will need to treat for low blood sugar: - Do not take insulin. - Treat a low blood sugar (less than 70 mg/dL) with  cup of clear juice (cranberry or apple), 4 glucose tablets, OR glucose gel. - Recheck blood sugar in 15 minutes after treatment (to make sure it is greater than 70 mg/dL). If your blood sugar is not greater than 70 mg/dL on recheck, call [phone] for further instructions. . Report  your blood sugar to the short stay nurse when you get to Short Stay.  . If you are admitted to the hospital after surgery: o Your blood sugar will be checked by the staff and you will probably be given insulin after surgery (instead of oral diabetes medicines) to make sure you have good blood sugar levels. o The goal for blood sugar control after surgery is 80-180 mg/dL.                        Do not wear jewelry, make up, or nail polish            Do not wear lotions, powders,  perfumes/colognes, or deodorant.            Do not shave 48 hours prior to surgery.  Men may shave face and neck.            Do not bring valuables to the hospital.            [facility] is not responsible for any belongings or valuables.  Do NOT Smoke (Tobacco/Vapping) or drink Alcohol 24 hours prior to your procedure  If you use a CPAP at night, you may bring all equipment for your overnight stay.   Contacts, glasses, dentures or bridgework may not be worn into surgery.      For patients admitted to the hospital, discharge time will be determined by your treatment team.   Patients discharged the day of surgery will not be allowed to drive home, and someone needs to stay with them for 24 hours.    Special instructions:   [place]- Preparing For Surgery  Before surgery, you can play an important role. Because skin is not sterile, your skin needs to be as free of germs as possible. You can reduce the number of germs on your skin by washing with CHG (chlorahexidine gluconate) Soap before surgery.  CHG is an antiseptic cleaner which kills germs and bonds with the skin to continue killing germs even after washing.    Oral Hygiene is also important to reduce your risk of infection.  Remember - BRUSH YOUR TEETH THE MORNING OF SURGERY WITH YOUR REGULAR TOOTHPASTE  Please do not use if you have an allergy to CHG or antibacterial soaps. If your skin becomes reddened/irritated stop using the CHG.  Do not shave (including legs and underarms) for at least 48 hours prior to first CHG shower. It is OK to shave your face.  Please follow these instructions carefully.   1. Shower the NIGHT BEFORE SURGERY and the MORNING OF SURGERY with CHG Soap.   2. If you chose to wash your hair, wash your hair first as usual with your normal shampoo.  3. After you shampoo, rinse your hair and body thoroughly to remove the shampoo.  4. Use CHG as you would any other liquid soap. You can apply CHG directly to  the skin and wash gently with a scrungie or a clean washcloth.   5. Apply the CHG Soap to your body ONLY FROM THE NECK DOWN.  Do not use on open wounds or open sores. Avoid contact with your eyes, ears, mouth and genitals (private parts). Wash Face and genitals (private parts)  with your normal soap.   6. Wash thoroughly, paying special attention to the area where your surgery will be performed.  7. Thoroughly rinse your body with warm water from the neck down.  8. DO NOT shower/wash with  your normal soap after using and rinsing off the CHG Soap.  9. Pat yourself dry with a CLEAN TOWEL.  10. Wear CLEAN PAJAMAS to bed the night before surgery, wear comfortable clothes the morning of surgery  11. Place CLEAN SHEETS on your bed the night of your first shower and DO NOT SLEEP WITH PETS.   Day of Surgery: Shower with CHG soap as directed Do not apply any deodorants/lotions.  Please wear clean clothes to the hospital/surgery center.   Remember to brush your teeth WITH YOUR REGULAR TOOTHPASTE.   Please read over the following fact sheets that you were given.

## 2020-01-27 ENCOUNTER — Other Ambulatory Visit: Payer: Self-pay

## 2020-01-27 ENCOUNTER — Encounter (HOSPITAL_COMMUNITY)
Admission: RE | Admit: 2020-01-27 | Discharge: 2020-01-27 | Disposition: A | Discharge status: Home / Self Care | Payer: Medicare PPO | Source: Ambulatory Visit | Attending: Orthopedic Surgery | Admitting: Orthopedic Surgery

## 2020-01-27 ENCOUNTER — Other Ambulatory Visit (HOSPITAL_COMMUNITY)
Admission: RE | Admit: 2020-01-27 | Discharge: 2020-01-27 | Disposition: A | Discharge status: Home / Self Care | Payer: Medicare PPO | Source: Ambulatory Visit | Attending: Orthopedic Surgery | Admitting: Orthopedic Surgery

## 2020-01-27 ENCOUNTER — Ambulatory Visit (HOSPITAL_COMMUNITY)
Admission: RE | Admit: 2020-01-27 | Discharge: 2020-01-27 | Disposition: A | Discharge status: Home / Self Care | Payer: Medicare PPO | Source: Ambulatory Visit | Attending: Orthopedic Surgery | Admitting: Orthopedic Surgery

## 2020-01-27 ENCOUNTER — Encounter (HOSPITAL_COMMUNITY): Payer: Self-pay

## 2020-01-27 DIAGNOSIS — N183 Chronic kidney disease, stage 3 unspecified: Secondary | ICD-10-CM | POA: Diagnosis not present

## 2020-01-27 DIAGNOSIS — I480 Paroxysmal atrial fibrillation: Secondary | ICD-10-CM | POA: Diagnosis not present

## 2020-01-27 DIAGNOSIS — E1151 Type 2 diabetes mellitus with diabetic peripheral angiopathy without gangrene: Secondary | ICD-10-CM | POA: Diagnosis not present

## 2020-01-27 DIAGNOSIS — Z01818 Encounter for other preprocedural examination: Secondary | ICD-10-CM | POA: Diagnosis not present

## 2020-01-27 DIAGNOSIS — I129 Hypertensive chronic kidney disease with stage 1 through stage 4 chronic kidney disease, or unspecified chronic kidney disease: Secondary | ICD-10-CM | POA: Diagnosis not present

## 2020-01-27 DIAGNOSIS — R937 Abnormal findings on diagnostic imaging of other parts of musculoskeletal system: Secondary | ICD-10-CM | POA: Diagnosis not present

## 2020-01-27 DIAGNOSIS — E1140 Type 2 diabetes mellitus with diabetic neuropathy, unspecified: Secondary | ICD-10-CM | POA: Insufficient documentation

## 2020-01-27 DIAGNOSIS — Z20822 Contact with and (suspected) exposure to covid-19: Secondary | ICD-10-CM | POA: Diagnosis not present

## 2020-01-27 DIAGNOSIS — M1990 Unspecified osteoarthritis, unspecified site: Secondary | ICD-10-CM | POA: Insufficient documentation

## 2020-01-27 DIAGNOSIS — Z7901 Long term (current) use of anticoagulants: Secondary | ICD-10-CM | POA: Diagnosis not present

## 2020-01-27 DIAGNOSIS — J45909 Unspecified asthma, uncomplicated: Secondary | ICD-10-CM | POA: Insufficient documentation

## 2020-01-27 DIAGNOSIS — E1122 Type 2 diabetes mellitus with diabetic chronic kidney disease: Secondary | ICD-10-CM | POA: Insufficient documentation

## 2020-01-27 DIAGNOSIS — M5380 Other specified dorsopathies, site unspecified: Secondary | ICD-10-CM | POA: Diagnosis not present

## 2020-01-27 HISTORY — DX: Cardiac arrhythmia, unspecified: I49.9

## 2020-01-27 LAB — URINALYSIS, ROUTINE W REFLEX MICROSCOPIC
Bilirubin Urine: NEGATIVE
Glucose, UA: NEGATIVE mg/dL
Hgb urine dipstick: NEGATIVE
Ketones, ur: NEGATIVE mg/dL
Leukocytes,Ua: NEGATIVE
Nitrite: NEGATIVE
Protein, ur: NEGATIVE mg/dL
Specific Gravity, Urine: 1.014 (ref 1.005–1.030)
pH: 5 (ref 5.0–8.0)

## 2020-01-27 LAB — CBC
HCT: 41.1 % (ref 36.0–46.0)
Hemoglobin: 12.6 g/dL (ref 12.0–15.0)
MCH: 27.8 pg (ref 26.0–34.0)
MCHC: 30.7 g/dL (ref 30.0–36.0)
MCV: 90.7 fL (ref 80.0–100.0)
Platelets: 249 10*3/uL (ref 150–400)
RBC: 4.53 MIL/uL (ref 3.87–5.11)
RDW: 14.5 % (ref 11.5–15.5)
WBC: 9.6 10*3/uL (ref 4.0–10.5)
nRBC: 0 % (ref 0.0–0.2)

## 2020-01-27 LAB — BASIC METABOLIC PANEL
Anion gap: 10 (ref 5–15)
BUN: 21 mg/dL (ref 8–23)
CO2: 28 mmol/L (ref 22–32)
Calcium: 9.6 mg/dL (ref 8.9–10.3)
Chloride: 100 mmol/L (ref 98–111)
Creatinine, Ser: 0.92 mg/dL (ref 0.44–1.00)
GFR calc Af Amer: >60 mL/min (ref >60)
GFR calc non Af Amer: 60 mL/min — ABNORMAL LOW (ref >60)
Glucose, Bld: 161 mg/dL — ABNORMAL HIGH (ref 70–99)
Potassium: 4.4 mmol/L (ref 3.5–5.1)
Sodium: 138 mmol/L (ref 135–145)

## 2020-01-27 LAB — SURGICAL PCR SCREEN
MRSA, PCR: NEGATIVE
Staphylococcus aureus: NEGATIVE

## 2020-01-27 LAB — GLUCOSE, CAPILLARY: Glucose-Capillary: 142 mg/dL — ABNORMAL HIGH (ref 70–99)

## 2020-01-27 LAB — HEMOGLOBIN A1C
Hgb A1c MFr Bld: 6.1 % — ABNORMAL HIGH (ref 4.8–5.6)
Mean Plasma Glucose: 128.37 mg/dL

## 2020-01-27 NOTE — Progress Notes (Signed)
PCP - Dr. Leighton Ruff Cardiologist - Dr. Candee Furbish Endocrinologist - Dr. Buddy Duty  PPM/ICD - n/a Device Orders -  Rep Notified -   Chest x-ray - 01/27/2020 EKG - 09/12/2019 Stress Test - patient denies ECHO - patient denies Cardiac Cath - patient denies  Sleep Study - patient denies CPAP -   Fasting Blood Sugar - 100s Checks Blood Sugar ____5-6 _ times a day  Blood Thinner Instructions: Eliquis last dose 01/25/2020 Aspirin Instructions:   ERAS Protcol - n/a PRE-SURGERY Ensure or G2-   COVID TEST- after PAT appointment    Anesthesia review: yes, per MD order  Patient denies shortness of breath, fever, cough and chest pain at PAT appointment   All instructions explained to the patient, with a verbal understanding of the material. Patient agrees to go over the instructions while at home for a better understanding. Patient also instructed to self quarantine after being tested for COVID-19. The opportunity to ask questions was provided.

## 2020-01-28 LAB — SARS CORONAVIRUS 2 (TAT 6-24 HRS): SARS Coronavirus 2: NEGATIVE

## 2020-01-28 NOTE — Progress Notes (Signed)
Anesthesia Chart Review:  Follows with cardiology for hx of paroxysmal afib on Eliquis. Last seen by Dr. Marlou Porch 09/12/19. No changes made to management. Advised to followup in 1 yr. Preop clearance per telephone encounter 01/06/20, "Chart reviewed as part of pre-operative protocol coverage. Patient was contacted 01/06/2020 in reference to pre-operative risk assessment for pending surgery as outlined below.  [name] was last seen on Dr. Marlou Porch on 09/12/19 with history of PAF, HTN, DM, neuropathy, GIB, anxiety, asthma, arthritis, CKD III, hiatal hernia, IDA, PVD, pulmonary nodule. No prior echo on file but no hx of CHF noted in chart. RCRI 0.9% indicating low risk of CV complications. At last OV 09/12/19, was cleared for colonoscopy. I spoke with the patient and she states she is doing well from heart standpoint. She does not formally exercise but gets a lot of activity walking at [place], laundry, dusting and general ADLs without angina or dyspnea. Therefore, based on ACC/AHA guidelines, the patient would be at acceptable risk for the planned procedure without further cardiovascular testing."   Per pharmacy, pt can hold Eliquis 3d prior to procedure.   Reported hx of severe PONV.  IDDMII, A1c 6.1.  Preop labs reviewed, unremarkable.  EKG 09/12/19: NSR. Rate 65.  CHEST - 2 VIEW 01/27/20: COMPARISON:  05/18/2019, CT thoracic spine, 01/01/2020  FINDINGS: The heart size and mediastinal contours are within normal limits. Both lungs are clear. Redemonstrated superior endplate deformity of T8, better assessed by prior CT.  IMPRESSION: 1.  No acute abnormality of the lungs.  2. Redemonstrated superior endplate deformity of T8, better assessed by prior CT.   12/24/14 Exercise Nuclear stress test (Dr. Wynonia Lawman):  Impression: 1. Normal stress Myoview study with no evidence of ischemia or infarction. 2. Normal quantitative gated SPECT EF of 72% with normal wall motion.  3. Reduced exercise  tolerance for age.  Recommendations: This study shows no evidence of myocardial ischemia. Her exercise capacity severely reduced however that limits the prognostic value of the test.  12/21/14 Echo (Dr. Wynonia Lawman):  Conclusion: 1. Mild concentric LVH with normal LV systolic function. Estimated EF 55%. 2. Mild left atrial enlargement. 3. Mild mitral and tricuspid regurgitation.  PFTs 01/08/15: FVC 1.77 (62% pre, 70% post BD), FEV1 1.27 (59% pre, 67% post BD), DLCOunc 17.66 (72%).   Wynonia Musty [facility] Short Stay Center/Anesthesiology Phone [phone] 01/28/2020 8:43 AM

## 2020-01-28 NOTE — Anesthesia Preprocedure Evaluation (Addendum)
Anesthesia Evaluation  Patient identified by MRN, date of birth, ID band Patient awake    Reviewed: Allergy & Precautions, NPO status , Patient's Chart, lab work & pertinent test results  History of Anesthesia Complications (+) PONV  Airway Mallampati: II  TM Distance: >3 FB Neck ROM: Full    Dental no notable dental hx. (+) Teeth Intact, Dental Advisory Given   Pulmonary asthma ,    Pulmonary exam normal breath sounds clear to auscultation       Cardiovascular hypertension, Normal cardiovascular exam Rhythm:Regular Rate:Normal     Neuro/Psych Peripheral neuropathy  Neuromuscular disease    GI/Hepatic Neg liver ROS, GERD  ,  Endo/Other  diabetes, Type 2  Renal/GU CRFRenal disease     Musculoskeletal negative musculoskeletal ROS (+)   Abdominal   Peds  Hematology negative hematology ROS (+)   Anesthesia Other Findings   Reproductive/Obstetrics negative OB ROS                           Anesthesia Physical Anesthesia Plan  ASA: III  Anesthesia Plan: MAC   Post-op Pain Management:    Induction: Intravenous  PONV Risk Score and Plan: 3 and Treatment may vary due to age or medical condition, Ondansetron and Dexamethasone  Airway Management Planned: Nasal Cannula and Natural Airway  Additional Equipment: None  Intra-op Plan:   Post-operative Plan:   Informed Consent: I have reviewed the patients History and Physical, chart, labs and discussed the procedure including the risks, benefits and alternatives for the proposed anesthesia with the patient or authorized representative who has indicated his/her understanding and acceptance.     Dental advisory given  Plan Discussed with:   Anesthesia Plan Comments: (PAT note by Karoline Caldwell, PA-C: Follows with cardiology for hx of paroxysmal afib on Eliquis. Last seen by Dr. Marlou Porch 09/12/19. No changes made to management. Advised to  followup in 1 yr. Preop clearance per telephone encounter 01/06/20, "Chart reviewed as part of pre-operative protocol coverage. Patient was contacted 01/06/2020 in reference to pre-operative risk assessment for pending surgery as outlined below.  [name] was last seen on Dr. Marlou Porch on 09/12/19 with history of PAF, HTN, DM, neuropathy, GIB, anxiety, asthma, arthritis, CKD III, hiatal hernia, IDA, PVD, pulmonary nodule. No prior echo on file but no hx of CHF noted in chart. RCRI 0.9% indicating low risk of CV complications. At last OV 09/12/19, was cleared for colonoscopy. I spoke with the patient and she states she is doing well from heart standpoint. She does not formally exercise but gets a lot of activity walking at [place], laundry, dusting and general ADLs without angina or dyspnea. Therefore, based on ACC/AHA guidelines, the patient would be at acceptable risk for the planned procedure without further cardiovascular testing."   Per pharmacy, pt can hold Eliquis 3d prior to procedure.   Reported hx of severe PONV.  IDDMII, A1c 6.1.  Preop labs reviewed, unremarkable.  EKG 09/12/19: NSR. Rate 65.  CHEST - 2 VIEW 01/27/20: COMPARISON:  05/18/2019, CT thoracic spine, 01/01/2020  FINDINGS: The heart size and mediastinal contours are within normal limits. Both lungs are clear. Redemonstrated superior endplate deformity of T8, better assessed by prior CT.  IMPRESSION: 1.  No acute abnormality of the lungs.  2. Redemonstrated superior endplate deformity of T8, better assessed by prior CT.   12/24/14 Exercise Nuclear stress test (Dr. Wynonia Lawman):  Impression: 1. Normal stress Myoview study with no evidence of ischemia  or infarction. 2. Normal quantitative gated SPECT EF of 72% with normal wall motion.  3. Reduced exercise tolerance for age.  Recommendations: This study shows no evidence of myocardial ischemia. Her exercise capacity severely reduced however that limits the prognostic  value of the test.  12/21/14 Echo (Dr. Wynonia Lawman):  Conclusion: 1. Mild concentric LVH with normal LV systolic function. Estimated EF 55%. 2. Mild left atrial enlargement. 3. Mild mitral and tricuspid regurgitation.  PFTs 01/08/15: FVC 1.77 (62% pre, 70% post BD), FEV1 1.27 (59% pre, 67% post BD), DLCOunc 17.66 (72%).  )      Anesthesia Quick Evaluation

## 2020-01-29 ENCOUNTER — Encounter (HOSPITAL_COMMUNITY): Payer: Self-pay | Admitting: Orthopedic Surgery

## 2020-01-29 ENCOUNTER — Encounter (HOSPITAL_COMMUNITY)
Admission: RE | Disposition: A | Discharge status: Home / Self Care | Payer: Self-pay | Source: Home / Self Care | Attending: Orthopedic Surgery

## 2020-01-29 ENCOUNTER — Ambulatory Visit (HOSPITAL_COMMUNITY): Payer: Medicare PPO | Admitting: Anesthesiology

## 2020-01-29 ENCOUNTER — Other Ambulatory Visit: Payer: Self-pay

## 2020-01-29 ENCOUNTER — Ambulatory Visit (HOSPITAL_COMMUNITY)
Admission: RE | Admit: 2020-01-29 | Discharge: 2020-01-29 | Disposition: A | Discharge status: Home / Self Care | Payer: Medicare PPO | Attending: Orthopedic Surgery | Admitting: Orthopedic Surgery

## 2020-01-29 ENCOUNTER — Ambulatory Visit (HOSPITAL_COMMUNITY): Payer: Medicare PPO

## 2020-01-29 ENCOUNTER — Ambulatory Visit (HOSPITAL_COMMUNITY): Payer: Medicare PPO | Admitting: Physician Assistant

## 2020-01-29 DIAGNOSIS — Z794 Long term (current) use of insulin: Secondary | ICD-10-CM | POA: Diagnosis not present

## 2020-01-29 DIAGNOSIS — F329 Major depressive disorder, single episode, unspecified: Secondary | ICD-10-CM | POA: Insufficient documentation

## 2020-01-29 DIAGNOSIS — M4854XA Collapsed vertebra, not elsewhere classified, thoracic region, initial encounter for fracture: Secondary | ICD-10-CM | POA: Diagnosis not present

## 2020-01-29 DIAGNOSIS — E1140 Type 2 diabetes mellitus with diabetic neuropathy, unspecified: Secondary | ICD-10-CM | POA: Diagnosis not present

## 2020-01-29 DIAGNOSIS — E1151 Type 2 diabetes mellitus with diabetic peripheral angiopathy without gangrene: Secondary | ICD-10-CM | POA: Insufficient documentation

## 2020-01-29 DIAGNOSIS — E7800 Pure hypercholesterolemia, unspecified: Secondary | ICD-10-CM | POA: Diagnosis not present

## 2020-01-29 DIAGNOSIS — G709 Myoneural disorder, unspecified: Secondary | ICD-10-CM | POA: Diagnosis not present

## 2020-01-29 DIAGNOSIS — J45909 Unspecified asthma, uncomplicated: Secondary | ICD-10-CM | POA: Insufficient documentation

## 2020-01-29 DIAGNOSIS — E1122 Type 2 diabetes mellitus with diabetic chronic kidney disease: Secondary | ICD-10-CM | POA: Diagnosis not present

## 2020-01-29 DIAGNOSIS — D509 Iron deficiency anemia, unspecified: Secondary | ICD-10-CM | POA: Insufficient documentation

## 2020-01-29 DIAGNOSIS — N183 Chronic kidney disease, stage 3 unspecified: Secondary | ICD-10-CM | POA: Insufficient documentation

## 2020-01-29 DIAGNOSIS — Z419 Encounter for procedure for purposes other than remedying health state, unspecified: Secondary | ICD-10-CM

## 2020-01-29 DIAGNOSIS — Z79899 Other long term (current) drug therapy: Secondary | ICD-10-CM | POA: Insufficient documentation

## 2020-01-29 DIAGNOSIS — I10 Essential (primary) hypertension: Secondary | ICD-10-CM | POA: Diagnosis not present

## 2020-01-29 DIAGNOSIS — I129 Hypertensive chronic kidney disease with stage 1 through stage 4 chronic kidney disease, or unspecified chronic kidney disease: Secondary | ICD-10-CM | POA: Insufficient documentation

## 2020-01-29 DIAGNOSIS — M8008XA Age-related osteoporosis with current pathological fracture, vertebra(e), initial encounter for fracture: Secondary | ICD-10-CM | POA: Diagnosis not present

## 2020-01-29 DIAGNOSIS — E1142 Type 2 diabetes mellitus with diabetic polyneuropathy: Secondary | ICD-10-CM | POA: Insufficient documentation

## 2020-01-29 DIAGNOSIS — K219 Gastro-esophageal reflux disease without esophagitis: Secondary | ICD-10-CM | POA: Insufficient documentation

## 2020-01-29 DIAGNOSIS — Z7901 Long term (current) use of anticoagulants: Secondary | ICD-10-CM | POA: Diagnosis not present

## 2020-01-29 DIAGNOSIS — I480 Paroxysmal atrial fibrillation: Secondary | ICD-10-CM | POA: Insufficient documentation

## 2020-01-29 HISTORY — PX: KYPHOPLASTY: SHX5884

## 2020-01-29 LAB — GLUCOSE, CAPILLARY
Glucose-Capillary: 134 mg/dL — ABNORMAL HIGH (ref 70–99)
Glucose-Capillary: 157 mg/dL — ABNORMAL HIGH (ref 70–99)

## 2020-01-29 LAB — PROTIME-INR
INR: 1.1 (ref 0.8–1.2)
Prothrombin Time: 13.5 seconds (ref 11.4–15.2)

## 2020-01-29 LAB — APTT: aPTT: 37 seconds — ABNORMAL HIGH (ref 24–36)

## 2020-01-29 SURGERY — KYPHOPLASTY
Anesthesia: Monitor Anesthesia Care

## 2020-01-29 MED ORDER — 0.9 % SODIUM CHLORIDE (POUR BTL) OPTIME
TOPICAL | Status: DC | PRN
  Administered 2020-01-29 (×2): 1000 mL

## 2020-01-29 MED ORDER — CEFAZOLIN SODIUM-DEXTROSE 2-4 GM/100ML-% IV SOLN
2.0000 g | INTRAVENOUS | Status: AC
  Administered 2020-01-29: 2 g via INTRAVENOUS

## 2020-01-29 MED ORDER — MIDAZOLAM HCL 2 MG/2ML IJ SOLN
INTRAMUSCULAR | Status: AC
  Filled 2020-01-29: qty 2

## 2020-01-29 MED ORDER — BUPIVACAINE LIPOSOME 1.3 % IJ SUSP
INTRAMUSCULAR | Status: DC | PRN
  Administered 2020-01-29: 20 mL

## 2020-01-29 MED ORDER — CHLORHEXIDINE GLUCONATE 0.12 % MT SOLN: 15.0000 mL | Freq: Once | OROMUCOSAL | Status: DC

## 2020-01-29 MED ORDER — FENTANYL CITRATE (PF) 250 MCG/5ML IJ SOLN
INTRAMUSCULAR | Status: AC
  Filled 2020-01-29: qty 5

## 2020-01-29 MED ORDER — ONDANSETRON HCL 4 MG/2ML IJ SOLN
INTRAMUSCULAR | Status: DC | PRN
  Administered 2020-01-29: 4 mg via INTRAVENOUS

## 2020-01-29 MED ORDER — DEXAMETHASONE SODIUM PHOSPHATE 4 MG/ML IJ SOLN
INTRAMUSCULAR | Status: DC | PRN
Start: 2020-01-29 — End: 2020-01-29
  Administered 2020-01-29: 4 mg via INTRAVENOUS

## 2020-01-29 MED ORDER — DEXAMETHASONE SODIUM PHOSPHATE 10 MG/ML IJ SOLN
INTRAMUSCULAR | Status: AC
  Filled 2020-01-29: qty 1

## 2020-01-29 MED ORDER — EPINEPHRINE PF 1 MG/ML IJ SOLN
INTRAMUSCULAR | Status: AC
  Filled 2020-01-29: qty 1

## 2020-01-29 MED ORDER — PROPOFOL 500 MG/50ML IV EMUL
INTRAVENOUS | Status: DC | PRN
  Administered 2020-01-29: 75 ug/kg/min via INTRAVENOUS

## 2020-01-29 MED ORDER — PROPOFOL 10 MG/ML IV BOLUS
INTRAVENOUS | Status: AC
  Filled 2020-01-29: qty 20

## 2020-01-29 MED ORDER — CHLORHEXIDINE GLUCONATE 0.12 % MT SOLN
OROMUCOSAL | Status: AC
  Filled 2020-01-29: qty 15

## 2020-01-29 MED ORDER — DEXMEDETOMIDINE HCL 200 MCG/2ML IV SOLN
INTRAVENOUS | Status: DC | PRN
  Administered 2020-01-29: 4 ug via INTRAVENOUS
  Administered 2020-01-29: 8 ug via INTRAVENOUS
  Administered 2020-01-29: 4 ug via INTRAVENOUS

## 2020-01-29 MED ORDER — BUPIVACAINE HCL (PF) 0.25 % IJ SOLN
INTRAMUSCULAR | Status: AC
  Filled 2020-01-29: qty 30

## 2020-01-29 MED ORDER — HYDROCODONE-ACETAMINOPHEN 5-325 MG PO TABS: 1.0000 | ORAL_TABLET | Freq: Four times a day (QID) | ORAL | 0 refills | Status: AC | PRN

## 2020-01-29 MED ORDER — MIDAZOLAM HCL 5 MG/5ML IJ SOLN
INTRAMUSCULAR | Status: DC | PRN
  Administered 2020-01-29: 1 mg via INTRAVENOUS

## 2020-01-29 MED ORDER — BUPIVACAINE-EPINEPHRINE 0.25% -1:200000 IJ SOLN
INTRAMUSCULAR | Status: DC | PRN
  Administered 2020-01-29: 10 mL
  Administered 2020-01-29: 20 mL

## 2020-01-29 MED ORDER — LACTATED RINGERS IV SOLN: INTRAVENOUS | Status: DC | PRN

## 2020-01-29 MED ORDER — LIDOCAINE 2% (20 MG/ML) 5 ML SYRINGE
INTRAMUSCULAR | Status: AC
  Filled 2020-01-29: qty 5

## 2020-01-29 MED ORDER — ORAL CARE MOUTH RINSE: 15.0000 mL | Freq: Once | OROMUCOSAL | Status: DC

## 2020-01-29 MED ORDER — ONDANSETRON HCL 4 MG/2ML IJ SOLN
INTRAMUSCULAR | Status: AC
  Filled 2020-01-29: qty 2

## 2020-01-29 SURGICAL SUPPLY — 45 items
BLADE SURG 15 STRL LF DISP TIS (BLADE) ×1 IMPLANT
BLADE SURG 15 STRL SS (BLADE) ×3
BNDG ADH 1X3 SHEER STRL LF (GAUZE/BANDAGES/DRESSINGS) ×6 IMPLANT
CEMENT KYPHON CX01A KIT/MIXER (Cement) ×5 IMPLANT
COVER MAYO STAND STRL (DRAPES) ×3 IMPLANT
COVER SURGICAL LIGHT HANDLE (MISCELLANEOUS) ×3 IMPLANT
COVER WAND RF STERILE (DRAPES) IMPLANT
CURETTE EXPRESS SZ2 7MM (INSTRUMENTS) IMPLANT
CURETTE WEDGE 8.5MM KYPHX (MISCELLANEOUS) IMPLANT
CURRETTE EXPRESS SZ2 7MM (INSTRUMENTS) ×3
DERMABOND ADHESIVE PROPEN (GAUZE/BANDAGES/DRESSINGS) ×2
DERMABOND ADVANCED (GAUZE/BANDAGES/DRESSINGS) ×2
DERMABOND ADVANCED .7 DNX12 (GAUZE/BANDAGES/DRESSINGS) ×1 IMPLANT
DERMABOND ADVANCED .7 DNX6 (GAUZE/BANDAGES/DRESSINGS) IMPLANT
DRAPE C-ARM 42X72 X-RAY (DRAPES) ×6 IMPLANT
DRAPE INCISE IOBAN 66X45 STRL (DRAPES) ×3 IMPLANT
DRAPE LAPAROTOMY T 102X78X121 (DRAPES) ×3 IMPLANT
DRAPE WARM FLUID 44X44 (DRAPES) ×3 IMPLANT
DURAPREP 26ML APPLICATOR (WOUND CARE) ×3 IMPLANT
GLOVE BIO SURGEON STRL SZ 6.5 (GLOVE) ×2 IMPLANT
GLOVE BIO SURGEONS STRL SZ 6.5 (GLOVE) ×1
GLOVE BIOGEL PI IND STRL 6.5 (GLOVE) ×1 IMPLANT
GLOVE BIOGEL PI IND STRL 8.5 (GLOVE) IMPLANT
GLOVE BIOGEL PI INDICATOR 6.5 (GLOVE) ×2
GLOVE BIOGEL PI INDICATOR 8.5 (GLOVE)
GLOVE SS BIOGEL STRL SZ 8.5 (GLOVE) ×1 IMPLANT
GLOVE SUPERSENSE BIOGEL SZ 8.5 (GLOVE) ×2
GOWN STRL REUS W/ TWL LRG LVL3 (GOWN DISPOSABLE) ×2 IMPLANT
GOWN STRL REUS W/TWL 2XL LVL3 (GOWN DISPOSABLE) ×3 IMPLANT
GOWN STRL REUS W/TWL LRG LVL3 (GOWN DISPOSABLE) ×6
KIT BASIN OR (CUSTOM PROCEDURE TRAY) ×3 IMPLANT
KIT TURNOVER KIT B (KITS) ×3 IMPLANT
NDL SPNL 22GX3.5 QUINCKE BK (NEEDLE) ×1 IMPLANT
NEEDLE HYPO 22GX1.5 SAFETY (NEEDLE) ×3 IMPLANT
NEEDLE SPNL 22GX3.5 QUINCKE BK (NEEDLE) ×3 IMPLANT
NS IRRIG 1000ML POUR BTL (IV SOLUTION) ×3 IMPLANT
PACK SURGICAL SETUP 50X90 (CUSTOM PROCEDURE TRAY) ×3 IMPLANT
PAD ARMBOARD 7.5X6 YLW CONV (MISCELLANEOUS) ×6 IMPLANT
SPONGE LAP 4X18 RFD (DISPOSABLE) ×3 IMPLANT
SUT MNCRL AB 3-0 PS2 18 (SUTURE) ×3 IMPLANT
SYR CONTROL 10ML LL (SYRINGE) ×3 IMPLANT
TOWEL GREEN STERILE (TOWEL DISPOSABLE) ×3 IMPLANT
TRAY KYPHOPAK 15/3 ONESTEP 1ST (MISCELLANEOUS) ×4 IMPLANT
TRAY KYPHOPAK 20/3 ONESTEP 1ST (MISCELLANEOUS) IMPLANT
WATER STERILE IRR 1000ML POUR (IV SOLUTION) ×3 IMPLANT

## 2020-01-29 NOTE — Transfer of Care (Signed)
Immediate Anesthesia Transfer of Care Note  Patient: [name]  Procedure(s) Performed: T8 KYPHOPLASTY (N/A )  Patient Location: PACU  Anesthesia Type:MAC  Level of Consciousness: oriented, sedated and patient cooperative  Airway & Oxygen Therapy: Patient Spontanous Breathing and Patient connected to face mask oxygen  Post-op Assessment: Report given to RN and Post -op Vital signs reviewed and stable  Post vital signs: Reviewed  Last Vitals:  Vitals Value Taken Time  BP 117/52 01/29/20 0830  Temp    Pulse 60 01/29/20 0834  Resp 18 01/29/20 0834  SpO2 100 % 01/29/20 0834  Vitals shown include unvalidated device data.  Last Pain:  Vitals:   01/29/20 0625  PainSc: 7       Patients Stated Pain Goal: 0 (53/61/44 3154)  Complications: No apparent anesthesia complications

## 2020-01-29 NOTE — Op Note (Signed)
Operative report  Preoperative diagnosis: T8 osteoporotic compression fracture  Postoperative diagnosis: Same  Operative procedure: T8 kyphoplasty  Complications: None  Indications: This is a very pleasant 79 year old woman who has had acute onset of severe midthoracic pain.  She has had a prior spinal cord stimulator placed and had increased pain.  Imaging studies confirmed a T8 compression fracture.  As result of the increasing pain and failure to improve we elected to move forward with surgical intervention.  All appropriate risks benefits and alternatives were discussed with the patient and consent was obtained.  Operative report: Patient was brought the operating room placed upon the operating room table.  After the successful induction of IV sedation the thoracic spine was prepped and draped in a standard fashion.  Timeout was taken to confirm patient procedure and all other important data.  Using fluoroscopy identified the T8 vertebral body.  I then anesthetized the skin with quarter percent Marcaine with Exparel.  I then used a spinal needle to anesthetize the deep muscle and periosteum.  Once we had successful IV sedation and local anesthesia a small stab incision was made and advanced the Jamshidi needle percutaneously to the lateral aspect of the T 8 pedicle.  I then advanced the Jamshidi needle in an extrapedicular fashion and then entered into the vertebral body nearing the base of the pedicle.  I confirmed satisfactory position and trajectory with both the AP and lateral fluoroscopy views.  I repeated this on the contralateral side.  Once both pedicles were cannulated I then inserted the drill and then the curette and finally inflatable bone tamps.  After inflating a bone tamps I removed the right one and then inserted approximately 1 cc of cement.  There was a slight leak of cement into the superior disc space level.  As I inserted the cement on the left side I noted a small amount of  cement leak laterally.  Ultimately though I was able to get the cement to come across the midline and I had a satisfactory fill within the vertebral body.  There was no posterior leak of cement.  Once the cement was allowed to cure I remove the trochars clean the skin and then closed the stab incision with interrupted 3-0 Monocryl simple suture.  Skin glue and Band-Aids were applied.  Additional local anesthesia was provided before closing the wound to aid in postoperative analgesia.  The patient was then transferred back to the PACU without incident.  The end of the case all needle sponge counts were correct.

## 2020-01-29 NOTE — Anesthesia Procedure Notes (Signed)
Procedure Name: MAC Date/Time: 01/29/2020 7:39 AM Performed by: Jenne Campus, CRNA Pre-anesthesia Checklist: Patient identified, Emergency Drugs available, Suction available and Patient being monitored Oxygen Delivery Method: Simple face mask

## 2020-01-29 NOTE — Brief Op Note (Signed)
01/29/2020  8:26 AM  PATIENT:  Andrey Cota  79 y.o. female  PRE-OPERATIVE DIAGNOSIS:  T8 Compression fracture  POST-OPERATIVE DIAGNOSIS:  T8 Compression fracture  PROCEDURE:  Procedure(s) with comments: T8 KYPHOPLASTY (N/A) - 60 mins Local with IV Regional  SURGEON:  Surgeon(s) and Role:    Melina Schools, MD - Primary  PHYSICIAN ASSISTANT:   ASSISTANTS: none   ANESTHESIA:   local and IV sedation  EBL:  none   BLOOD ADMINISTERED:none  DRAINS: none   LOCAL MEDICATIONS USED:  MARCAINE    and OTHER exparel  SPECIMEN:  No Specimen  DISPOSITION OF SPECIMEN:  N/A  COUNTS:  YES  TOURNIQUET:  * No tourniquets in log *  DICTATION: .Dragon Dictation  PLAN OF CARE: Discharge to home after PACU  PATIENT DISPOSITION:  PACU - hemodynamically stable.

## 2020-01-29 NOTE — Anesthesia Postprocedure Evaluation (Signed)
Anesthesia Post Note  Patient: [name]  Procedure(s) Performed: T8 KYPHOPLASTY (N/A )     Patient location during evaluation: PACU Anesthesia Type: MAC Level of consciousness: awake and alert Pain management: pain level controlled Vital Signs Assessment: post-procedure vital signs reviewed and stable Respiratory status: spontaneous breathing, nonlabored ventilation, respiratory function stable and patient connected to nasal cannula oxygen Cardiovascular status: stable and blood pressure returned to baseline Postop Assessment: no apparent nausea or vomiting Anesthetic complications: no    Last Vitals:  Vitals:   01/29/20 0900 01/29/20 0915  BP: 129/62 (!) 145/59  Pulse: 62 65  Resp: 17 14  Temp:  36.4 C  SpO2: 99% 96%    Last Pain:  Vitals:   01/29/20 0915  PainSc: 0-No pain                 Barnet Glasgow

## 2020-01-29 NOTE — H&P (Signed)
Addendum H&P  Patient continues to have midthoracic pain secondary to her T8 compression fracture.  We have gone over the surgical procedure in great detail, and she has been evaluated and cleared by her cardiologist and her primary care physician.  All of her questions and concerns were addressed.  We reviewed the risks and benefits again.  There is been no change in her clinical exam since her last office visit of 01/19/2020.

## 2020-01-29 NOTE — Discharge Instructions (Signed)
RESTART ELIQUIS ON Saturday 01/31/20

## 2020-02-11 DIAGNOSIS — I480 Paroxysmal atrial fibrillation: Secondary | ICD-10-CM | POA: Diagnosis not present

## 2020-02-13 DIAGNOSIS — Z4889 Encounter for other specified surgical aftercare: Secondary | ICD-10-CM | POA: Diagnosis not present

## 2020-02-28 ENCOUNTER — Encounter: Payer: Self-pay | Admitting: Podiatry

## 2020-02-28 NOTE — Progress Notes (Signed)
  Subjective:  Patient ID: [name], female    DOB: 13-Oct-1940,  MRN: [id number]  No chief complaint on file.  79 y.o. female presents with the above complaint. History confirmed with patient.   Objective:  Physical Exam: warm, good capillary refill, onychomycosis of the toenails with pain to palpation of the nailbeds, no trophic changes or ulcerative lesions, normal DP and PT pulses, and absent protective sensation Left Foot: normal exam, no swelling, tenderness, instability; ligaments intact, full range of motion of all ankle/foot joints  Right Foot: normal exam, no swelling, tenderness, instability; ligaments intact, full range of motion of all ankle/foot joints   Assessment:   1. Onychomycosis of multiple toenails with type 2 diabetes mellitus and peripheral neuropathy ([place])      Plan:  Patient was evaluated and treated and all questions answered.  Onychomycosis, Diabetes and DPN -Patient is diabetic with a qualifying condition for at risk foot care.   Procedure: Nail Debridement Rationale: Patient meets criteria for routine foot care due to DPN Type of Debridement: manual, sharp debridement. Instrumentation: Nail nipper, rotary burr. Number of Nails: 10     No follow-ups on file.

## 2020-03-08 ENCOUNTER — Ambulatory Visit (INDEPENDENT_AMBULATORY_CARE_PROVIDER_SITE_OTHER): Payer: Medicare PPO | Admitting: Podiatry

## 2020-03-08 ENCOUNTER — Other Ambulatory Visit: Payer: Self-pay

## 2020-03-08 DIAGNOSIS — E1169 Type 2 diabetes mellitus with other specified complication: Secondary | ICD-10-CM

## 2020-03-08 DIAGNOSIS — E1142 Type 2 diabetes mellitus with diabetic polyneuropathy: Secondary | ICD-10-CM

## 2020-03-08 DIAGNOSIS — B351 Tinea unguium: Secondary | ICD-10-CM | POA: Diagnosis not present

## 2020-03-08 NOTE — Progress Notes (Signed)
  Subjective:  Patient ID: [name], female    DOB: [birth year],  MRN: [id number]  Chief Complaint  Patient presents with  . debride    DFC  . Diabetes    FBS: 115 a1C: 5.9 PCP: barnes x 3 mo   79 y.o. female presents with the above complaint. History confirmed with patient.   Objective:  Physical Exam: warm, good capillary refill, onychomycosis of the toenails with pain to palpation of the nailbeds, no trophic changes or ulcerative lesions, normal DP and PT pulses, and absent protective sensation Left Foot: normal exam, no swelling, tenderness, instability; ligaments intact, full range of motion of all ankle/foot joints  Right Foot: normal exam, no swelling, tenderness, instability; ligaments intact, full range of motion of all ankle/foot joints   Assessment:   1. Onychomycosis of multiple toenails with type 2 diabetes mellitus and peripheral neuropathy ([place])      Plan:  Patient was evaluated and treated and all questions answered.  Onychomycosis, Diabetes and DPN -Patient is diabetic with a qualifying condition for at risk foot care.   Procedure: Nail Debridement Rationale: Patient meets criteria for routine foot care due to DPN Type of Debridement: manual, sharp debridement. Instrumentation: Nail nipper, rotary burr. Number of Nails: 10        Return in about 9 weeks (around 05/10/2020) for Diabetic Foot Care.

## 2020-03-12 DIAGNOSIS — Z4889 Encounter for other specified surgical aftercare: Secondary | ICD-10-CM | POA: Diagnosis not present

## 2020-03-16 DIAGNOSIS — E1142 Type 2 diabetes mellitus with diabetic polyneuropathy: Secondary | ICD-10-CM | POA: Diagnosis not present

## 2020-03-16 DIAGNOSIS — Z794 Long term (current) use of insulin: Secondary | ICD-10-CM | POA: Diagnosis not present

## 2020-03-19 ENCOUNTER — Other Ambulatory Visit: Payer: Self-pay | Admitting: Gastroenterology

## 2020-03-19 DIAGNOSIS — R1010 Upper abdominal pain, unspecified: Secondary | ICD-10-CM | POA: Diagnosis not present

## 2020-03-19 DIAGNOSIS — D649 Anemia, unspecified: Secondary | ICD-10-CM | POA: Diagnosis not present

## 2020-03-19 DIAGNOSIS — R1084 Generalized abdominal pain: Secondary | ICD-10-CM

## 2020-03-19 DIAGNOSIS — K5901 Slow transit constipation: Secondary | ICD-10-CM | POA: Diagnosis not present

## 2020-03-22 ENCOUNTER — Telehealth: Payer: Self-pay | Admitting: *Deleted

## 2020-03-22 DIAGNOSIS — I480 Paroxysmal atrial fibrillation: Secondary | ICD-10-CM

## 2020-03-22 NOTE — Telephone Encounter (Signed)
-----   Message from Jerline Pain, MD sent at 03/06/2020  7:47 AM EDT ----- Please obtain note from GI doctor regarding their wishes to change anticoagulation because of continued bleeding issues.  Ultimately, a change to Xarelto, Pradaxa, or Coumadin would not minimize or lessen her bleeding issue.  If GI feels that her bleeding is severe, she would need to stop her anticoagulation which would put her at increased risk for stroke with her known paroxysmal atrial fibrillation on monitor.  I would like for her to get on the schedule of our new EP (starting soon) to discuss watchman so that she could potentially come off of anticoagulation long-term.  Please see if you can help facilitate this, Edyth Glomb. Thanks  Candee Furbish, MD

## 2020-03-22 NOTE — Telephone Encounter (Signed)
Forwarding to Dr. Watt Climes (pt's GI MD) to review Dr. Marlou Porch note and advise. Will await his response prior to arranging EP referral to discuss watchman (pt is agreeable to this consult).

## 2020-03-23 NOTE — Telephone Encounter (Signed)
Attempted to call Barb back to discuss.  Advised she has left for the day and will return my call tomorrow.

## 2020-03-23 NOTE — Telephone Encounter (Signed)
Barb with Dr. Perley Jain office is calling to follow up in regards to anticoagulation. She states she is requesting to speak with Pam.

## 2020-03-24 NOTE — Telephone Encounter (Signed)
Spoke with Raford Pitcher from Dr Perley Jain office.  He has not asked and does not believe a change in pt's anticoagulant is necessary at this point in time.  Crista Elliot for the clarification since pt was stating Dr Watt Climes had wanted it changed.

## 2020-03-25 NOTE — Telephone Encounter (Addendum)
Forwarding to Dr. Marlou Porch, for his FYI. Forwarding to EP scheduler to arrange Watchman consult.    Shellia Cleverly, RN  Stanton Kidney, RN Spoke with Raford Pitcher at Dr Perley Jain office. He does not believe pt needs to change her anticoagulant d/t GI bleed. H/H is stable.   Thank you  Pam

## 2020-03-26 NOTE — Addendum Note (Signed)
Addended by: Stanton Kidney on: 03/26/2020 09:42 AM   Modules accepted: Orders

## 2020-04-01 DIAGNOSIS — E1142 Type 2 diabetes mellitus with diabetic polyneuropathy: Secondary | ICD-10-CM | POA: Diagnosis not present

## 2020-04-02 ENCOUNTER — Ambulatory Visit
Admission: RE | Admit: 2020-04-02 | Discharge: 2020-04-02 | Disposition: A | Discharge status: Home / Self Care | Payer: Medicare PPO | Source: Ambulatory Visit | Attending: Gastroenterology | Admitting: Gastroenterology

## 2020-04-02 ENCOUNTER — Other Ambulatory Visit: Payer: Self-pay

## 2020-04-02 DIAGNOSIS — N8189 Other female genital prolapse: Secondary | ICD-10-CM | POA: Diagnosis not present

## 2020-04-02 DIAGNOSIS — I700 Atherosclerosis of aorta: Secondary | ICD-10-CM | POA: Diagnosis not present

## 2020-04-02 DIAGNOSIS — R1084 Generalized abdominal pain: Secondary | ICD-10-CM

## 2020-04-02 DIAGNOSIS — K429 Umbilical hernia without obstruction or gangrene: Secondary | ICD-10-CM | POA: Diagnosis not present

## 2020-04-02 DIAGNOSIS — K439 Ventral hernia without obstruction or gangrene: Secondary | ICD-10-CM | POA: Diagnosis not present

## 2020-04-02 MED ORDER — IOPAMIDOL (ISOVUE-300) INJECTION 61%
80.0000 mL | Freq: Once | INTRAVENOUS | Status: AC | PRN
  Administered 2020-04-02: 80 mL via INTRAVENOUS

## 2020-04-11 NOTE — Progress Notes (Signed)
Watchman Consult Note   Date:  04/14/2020   ID:  GUILA OWENSBY, DOB 11-Jul-1941, MRN [id number]  PCP:  Leighton Ruff, MD  Cardiologist:  Candee Furbish, MD Primary Electrophysiologist: Lars Mage, MD Referring Physician: Candee Furbish, MD   CC: to discuss Watchman implant    History of Present Illness: [name] is a 79 y.o. female referred by Dr Marlou Porch for evaluation of atrial fibrillation and stroke prevention. She has paroxysmal atrial fibrillation as well as hyperlipidemia, diabetes, neuropathy, hypertension.  The patient has been evaluated by their referring physician and is felt to be a poor candidate for long term [place] due to multiple episodes of GI bleeding treated with argon laser.  She therefore presents today for Watchman evaluation.   Today, she denies symptoms of palpitations, chest pain, shortness of breath, orthopnea, PND, lower extremity edema, claudication, dizziness, presyncope, syncope, bleeding, or neurologic sequela. The patient is tolerating medications without difficulties and is otherwise without complaint today.    Past Medical History:  Diagnosis Date  . Acute GI bleeding 05/13/2015  . Anxiety   . Arthritis   . Asthma   . Atrial fibrillation ([place]) 10/29/2017  . Bakers cyst, left   . Chronic kidney disease    stage 3  . Constipation   . Diabetes mellitus    type 2  . Dysrhythmia   . GERD (gastroesophageal reflux disease)   . Heart murmur   . History of cellulitis   . History of dizziness   . History of hiatal hernia    tiny 06/15/2016  . Hypertension   . Iron deficiency anemia   . Peripheral vascular disease ([place])   . PONV (postoperative nausea and vomiting)   . Pulmonary nodule    Past Surgical History:  Procedure Laterality Date  . APPENDECTOMY  1952  . BIOPSY  10/16/2018   Procedure: BIOPSY;  Surgeon: Clarene Essex, MD;  Location: [facility];  Service: Endoscopy;;  . BIOPSY  11/26/2019   Procedure: BIOPSY;  Surgeon: Clarene Essex, MD;  Location: [facility];  Service: Endoscopy;;  . COLONOSCOPY W/ POLYPECTOMY  05/15/2013  . ESOPHAGOGASTRODUODENOSCOPY (EGD) WITH PROPOFOL N/A 06/15/2016   Procedure: ESOPHAGOGASTRODUODENOSCOPY (EGD) WITH PROPOFOL;  Surgeon: Clarene Essex, MD;  Location: [facility];  Service: Endoscopy;  Laterality: N/A;  . ESOPHAGOGASTRODUODENOSCOPY (EGD) WITH PROPOFOL N/A 10/16/2018   Procedure: ESOPHAGOGASTRODUODENOSCOPY (EGD) WITH PROPOFOL;  Surgeon: Clarene Essex, MD;  Location: [facility];  Service: Endoscopy;  Laterality: N/A;  . ESOPHAGOGASTRODUODENOSCOPY (EGD) WITH PROPOFOL N/A 11/26/2019   Procedure: ESOPHAGOGASTRODUODENOSCOPY (EGD) WITH PROPOFOL;  Surgeon: Clarene Essex, MD;  Location: [facility];  Service: Endoscopy;  Laterality: N/A;  . EYE SURGERY Bilateral    cataracts removed  . GI RADIOFREQUENCY ABLATION  10/16/2018   Procedure: GI RADIOFREQUENCY ABLATION;  Surgeon: Clarene Essex, MD;  Location: [facility];  Service: Endoscopy;;  . GI RADIOFREQUENCY ABLATION N/A 11/26/2019   Procedure: GI RADIOFREQUENCY ABLATION;  Surgeon: Clarene Essex, MD;  Location: [facility];  Service: Endoscopy;  Laterality: N/A;  . HAND SURGERY Left 2010   operated on index and ring finger  . INCONTINENCE SURGERY    . JOINT REPLACEMENT Right    knee  . KNEE ARTHROSCOPY  2003 2005   right knee  . KYPHOPLASTY N/A 01/29/2020   Procedure: T8 KYPHOPLASTY;  Surgeon: Melina Schools, MD;  Location: [facility];  Service: Orthopedics;  Laterality: N/A;  60 mins Local with [facility]  . mass fallopian tube    . SPINAL CORD STIMULATOR  BATTERY EXCHANGE N/A 09/09/2015   Procedure: SPINAL CORD STIMULATOR BATTERY EXCHANGE;  Surgeon: Melina Schools, MD;  Location: [facility];  Service: Orthopedics;  Laterality: N/A;  . TOTAL KNEE ARTHROPLASTY Left 11/05/2017   Procedure: LEFT TOTAL KNEE ARTHROPLASTY;  Surgeon: Paralee Cancel, MD;  Location: [facility];  Service: Orthopedics;  Laterality: Left;  Adductor Block  . VAGINAL HYSTERECTOMY  1977   1  ovary removed     Current Outpatient Medications  Medication Sig Dispense Refill  . albuterol (PROAIR HFA) 108 (90 BASE) MCG/ACT inhaler Inhale 2 puffs into the lungs every 4 (four) hours as needed for wheezing or shortness of breath. 1 Inhaler 3  . ALPRAZolam (XANAX) 0.25 MG tablet Take 1 tablet by mouth in the morning and at bedtime.    Marland Kitchen amLODipine (NORVASC) 10 MG tablet Take 10 mg by mouth daily.    Marland Kitchen ELIQUIS 5 MG TABS tablet TAKE 1 TABLET TWICE DAILY 180 tablet 2  . esomeprazole (NEXIUM) 40 MG capsule Take 40 mg by mouth in the morning and at bedtime. Morning & afternoon.  0  . gabapentin (NEURONTIN) 300 MG capsule Take 600 mg by mouth in the morning, at noon, and at bedtime.     . Insulin Glargine (LANTUS SOLOSTAR) 100 UNIT/ML Solostar Pen Inject 32 Units into the skin at bedtime.     . Iron-FA-B Cmp-C-Biot-Probiotic (FUSION PLUS) CAPS Take 1 capsule by mouth every evening.     Marland Kitchen losartan (COZAAR) 100 MG tablet Take 100 mg by mouth daily.    . metoprolol (LOPRESSOR) 50 MG tablet Take 75-100 mg by mouth See admin instructions. Take 2 tablets (100 mg) by mouth in the morning & take 1.5 tablets (75 mg) by mouth in the evening.    . Multiple Vitamins-Minerals (PRESERVISION AREDS 2 PO) Take 1 capsule by mouth 2 (two) times daily.     Marland Kitchen NOVOLOG FLEXPEN 100 UNIT/ML FlexPen Inject 12-16 Units into the skin in the morning, at noon, and at bedtime. Sliding scale insulin    . Pitavastatin Calcium (LIVALO) 1 MG TABS Take 1 mg by mouth at bedtime.    . sucralfate (CARAFATE) 1 g tablet Take 1 g by mouth in the morning and at bedtime.    . triamterene-hydrochlorothiazide (MAXZIDE-25) 37.5-25 MG tablet Take 2 capsules by mouth daily.     Marland Kitchen venlafaxine (EFFEXOR) 37.5 MG tablet Take 37.5 mg by mouth 2 (two) times daily.     No current facility-administered medications for this visit.    Allergies:   Vasotec, Atorvastatin, Codeine, Cymbalta [duloxetine hcl], Lansoprazole, Morphine and related, Sulfate,  Adhesive [tape], and Scopolamine   Social History:  The patient  reports that she has never smoked. She has never used smokeless tobacco. She reports that she does not drink alcohol and does not use drugs.   Family History:  The patient's  family history includes Heart attack in her father; Lung cancer in her mother.    ROS:  Please see the history of present illness.   All other systems are reviewed and negative.    PHYSICAL EXAM: VS:  BP 138/78   Pulse 73   Ht 5\' 4"  (1.626 m)   Wt 155 lb (70.3 kg)   SpO2 94%   BMI 26.61 kg/m  , BMI Body mass index is 26.61 kg/m. GEN: Well nourished, well developed, in no acute distress  HEENT: normal  Neck: no JVD, carotid bruits, or masses Cardiac: RRR; no murmurs, rubs, or gallops,no edema  Respiratory:  clear to auscultation bilaterally, normal work of breathing GI: soft, nontender, nondistended, + BS MS: no deformity or atrophy  Skin: warm and dry  Neuro:  Strength and sensation are intact Psych: euthymic mood, full affect  EKG:  EKG is ordered today. Shows normal sinus rhythm.  Recent Labs: 01/27/2020: BUN 21; Creatinine, Ser 0.92; Hemoglobin 12.6; Platelets 249; Potassium 4.4; Sodium 138    Lipid Panel  No results found for: CHOL, TRIG, HDL, CHOLHDL, VLDL, LDLCALC, LDLDIRECT   Wt Readings from Last 3 Encounters:  04/14/20 155 lb (70.3 kg)  01/29/20 152 lb (68.9 kg)  01/27/20 152 lb 4.8 oz (69.1 kg)      Other studies Reviewed: Additional studies/ records that were reviewed today include: ECG, ZIO  ZIO from February 14, 2020 personally reviewed and demonstrates atrial fibrillation with rapid ventricular response.  Overall burden of A. fib is 4% with the longest episode being over 6 hours.  EGD reviewed from November 26, 2019.  Gastric antral vascular ectasia without active bleeding was treated with radiofrequency ablation.  There were mucosal changes suspicious for Barrett's esophagus.  ASSESSMENT AND PLAN:  1.  Paroxysmal atrial  fibrillation I have seen [name] is a 79 y.o. female in the office today who has been referred by Dr. Marlou Porch for a Watchman left atrial appendage closure device.  She has a history of paroxysmal atrial fibrillation.  This patients CHA2DS2-VASc Score and unadjusted Ischemic Stroke Rate (% per year) is equal to 7.2 % stroke rate/year from a score of 5 which necessitates long term oral anticoagulation to prevent stroke. HasBled score is 3.  Unfortunately, She is not felt to be a long term anticoagulation candidate secondary to recurrent GI bleeds.  After reviewing the patient's chart I do think she would be a candidate for short-term anticoagulation.  Procedural risks for the Watchman implant have been reviewed with the patient including a 1% risk of stroke, and less than 1% risk of perforation or device embolization.Given the patient's poor candidacy for long-term oral anticoagulation, ability to tolerate short term oral anticoagulation, I have recommended the watchman left atrial appendage closure system.    Prior to the procedure, I would like to obtain a gated CT scan of the chest with contrast timed for PV/LA visualization.  Additionally, the patient will need a echocardiogram.  Once these are performed I will review to confirm the patient continues to be a suitable candidate for Watchman implant. If so, we will schedule for the implant procedure at the [name] available date.  Current medicines are reviewed at length with the patient today.   The patient does not have concerns regarding her medicines.  The following changes were made today:  none  Labs/ tests ordered today include:  Orders Placed This Encounter  Procedures  . CT CARDIAC MORPH/PULM VEIN W/CM&W/O CA SCORE  . EKG 12-Lead  echo   Signed, Lars Mage, MD 04/14/2020  6:35 PM     [place] [address] [phone] (office) [phone] (fax)

## 2020-04-12 DIAGNOSIS — E878 Other disorders of electrolyte and fluid balance, not elsewhere classified: Secondary | ICD-10-CM | POA: Diagnosis not present

## 2020-04-14 ENCOUNTER — Ambulatory Visit (INDEPENDENT_AMBULATORY_CARE_PROVIDER_SITE_OTHER): Payer: Medicare PPO | Admitting: Cardiology

## 2020-04-14 ENCOUNTER — Encounter: Payer: Self-pay | Admitting: Cardiology

## 2020-04-14 ENCOUNTER — Other Ambulatory Visit: Payer: Self-pay

## 2020-04-14 VITALS — BP 138/78 | HR 73 | Ht 64.0 in | Wt 155.0 lb

## 2020-04-14 DIAGNOSIS — I480 Paroxysmal atrial fibrillation: Secondary | ICD-10-CM | POA: Diagnosis not present

## 2020-04-14 DIAGNOSIS — Z8719 Personal history of other diseases of the digestive system: Secondary | ICD-10-CM | POA: Diagnosis not present

## 2020-04-14 NOTE — Patient Instructions (Addendum)
Medication Instructions:  Your physician recommends that you continue on your current medications as directed. Please refer to the Current Medication list given to you today.  Labwork: None ordered.  Testing/Procedures: You will be scheduled for a cardiac CT.  Follow-Up:  We will be in touch when we can move forward with implanting the Watchman device.   Any Other Special Instructions Will Be Listed Below (If Applicable).  If you need a refill on your cardiac medications before your next appointment, please call your pharmacy.    Left Atrial Appendage Closure Device Implantation  Left atrial appendage (LAA) closure device implantation is a procedure that is done to place a small device in the LAA of the heart. The left atrium is one of the heart's two upper chambers, and the LAA is a small sac in the wall of the left atrium. The device closes the LAA. This procedure can help prevent a stroke caused by atrial fibrillation. Atrial fibrillation is a type of irregular or rapid heartbeat (arrhythmia). When the heart does not beat normally and the heartbeat is irregular, there is an increased risk of blood clots and stroke. A blood clot can form in the LAA.

## 2020-04-16 ENCOUNTER — Telehealth: Payer: Self-pay

## 2020-04-16 DIAGNOSIS — I480 Paroxysmal atrial fibrillation: Secondary | ICD-10-CM

## 2020-04-16 NOTE — Telephone Encounter (Signed)
Need lab work.

## 2020-04-19 NOTE — Telephone Encounter (Signed)
Pt scheduled for BMP 04/20/2020 for cardiac CT.

## 2020-04-20 ENCOUNTER — Other Ambulatory Visit: Payer: Self-pay

## 2020-04-20 ENCOUNTER — Other Ambulatory Visit: Payer: Medicare PPO

## 2020-04-21 ENCOUNTER — Telehealth (HOSPITAL_COMMUNITY): Payer: Self-pay | Admitting: Emergency Medicine

## 2020-04-21 NOTE — Telephone Encounter (Signed)
[facility] report received from [facility] that showed labs were collected 8/16 and abnormal requiring IV hydration per the [facility] radiology contrast protocol.   Pt states she went to the office yesterday for repeat labs and was told she didn't need repeat labs.   Pt understands and agrees to the added IV hydration appt made for the infusion clinic tomorrow at [facility] [phone] Office  [phone] Cell

## 2020-04-21 NOTE — Telephone Encounter (Signed)
Reaching out to patient to offer assistance regarding upcoming cardiac imaging study; pt verbalizes understanding of appt date/time, parking situation and where to check in, pre-test NPO status and medications ordered, and verified current allergies; name and call back number provided for further questions should they arise Nancy Bond RN Navigator Cardiac Imaging [facility] [phone] office [phone] cell  appt made in infusion clinic for 1p for IV hydration. Pt states she had labs drawn yesterday at [facility]. I am working on getting copy faxed to me. If creat/GFR improved, I will cancel IV hydration appt. If worsen, I will instruct patient to proceed to infusion clinic for hydration at 1p. Pt verbalized understanding of these instructions.

## 2020-04-23 ENCOUNTER — Ambulatory Visit (HOSPITAL_COMMUNITY)
Admission: RE | Admit: 2020-04-23 | Discharge: 2020-04-23 | Disposition: A | Discharge status: Home / Self Care | Payer: Medicare PPO | Source: Ambulatory Visit | Attending: Cardiology | Admitting: Cardiology

## 2020-04-23 ENCOUNTER — Other Ambulatory Visit: Payer: Self-pay

## 2020-04-23 DIAGNOSIS — I480 Paroxysmal atrial fibrillation: Secondary | ICD-10-CM | POA: Insufficient documentation

## 2020-04-23 LAB — BASIC METABOLIC PANEL
Anion gap: 12 (ref 5–15)
BUN: 21 mg/dL (ref 8–23)
CO2: 24 mmol/L (ref 22–32)
Calcium: 9.2 mg/dL (ref 8.9–10.3)
Chloride: 96 mmol/L — ABNORMAL LOW (ref 98–111)
Creatinine, Ser: 1.32 mg/dL — ABNORMAL HIGH (ref 0.44–1.00)
GFR calc Af Amer: 45 mL/min — ABNORMAL LOW (ref >60)
GFR calc non Af Amer: 39 mL/min — ABNORMAL LOW (ref >60)
Glucose, Bld: 144 mg/dL — ABNORMAL HIGH (ref 70–99)
Potassium: 4.4 mmol/L (ref 3.5–5.1)
Sodium: 132 mmol/L — ABNORMAL LOW (ref 135–145)

## 2020-04-23 MED ORDER — SODIUM CHLORIDE 0.9 % WEIGHT BASED INFUSION
1.0000 mL/kg/h | INTRAVENOUS | Status: DC
  Administered 2020-04-23: 1 mL/kg/h via INTRAVENOUS

## 2020-04-23 MED ORDER — IOHEXOL 350 MG/ML SOLN
80.0000 mL | Freq: Once | INTRAVENOUS | Status: AC | PRN
  Administered 2020-04-23: 80 mL via INTRAVENOUS

## 2020-04-23 MED ORDER — SODIUM CHLORIDE 0.9 % WEIGHT BASED INFUSION
3.0000 mL/kg/h | INTRAVENOUS | Status: DC
  Administered 2020-04-23: 3 mL/kg/h via INTRAVENOUS

## 2020-04-26 ENCOUNTER — Telehealth: Payer: Self-pay | Admitting: *Deleted

## 2020-04-26 NOTE — Telephone Encounter (Signed)
Her kidney function was slightly down from prior.  It could have been because of this medication.  If she would like, she can restart it, the triamterene/hydrochlorothiazide and make sure that she follows up with Dr. Drema Dallas to check her kidney function and blood pressure once again.  Candee Furbish, MD

## 2020-04-26 NOTE — Telephone Encounter (Signed)
Pt aware of Dr Marlou Porch comments.  She has a f/u appt with Dr Drema Dallas in 1 week

## 2020-04-26 NOTE — Telephone Encounter (Signed)
Called and spoke with pt regarding her recent lab work and order to stop the triamterene/HCTZ.  Pt reports she did see on her lab report the note to stop this but didn't know if it was just for the CT or for good.  She has not taken it for several days.  She is concerned about stopping this medication because she has been having some swelling at her ankles and knees.  Her wt is up 2.5 lbs in 1 week, no new SOB.    She is also concerned about whether any of the medication she is taking for her BP could be causing her to have nausea in the morning when she gets up.  She reports it first started when she was having a problem with a bleeding ulcer (healed now).  The nausea initially resolved but has reoccurred and it is everyday.  She has not been checking her blood pressure, HR or BS in the am and does not know if the nausea is r/t any of those not being within normal.  Advised pt I will review this information with Dr Marlou Porch to see if he wants her to completely d/c triamterene/HCTZ or if there is anything else she could take in place of it d/t edema.  Also to review medication list for any reason for nausea.

## 2020-04-28 ENCOUNTER — Other Ambulatory Visit: Payer: Self-pay

## 2020-04-28 ENCOUNTER — Ambulatory Visit (HOSPITAL_COMMUNITY): Payer: Medicare PPO | Attending: Cardiovascular Disease

## 2020-04-28 DIAGNOSIS — I480 Paroxysmal atrial fibrillation: Secondary | ICD-10-CM | POA: Insufficient documentation

## 2020-04-28 LAB — ECHOCARDIOGRAM COMPLETE
Area-P 1/2: 4.39 cm2
S' Lateral: 2.75 cm

## 2020-05-04 DIAGNOSIS — E871 Hypo-osmolality and hyponatremia: Secondary | ICD-10-CM | POA: Diagnosis not present

## 2020-05-05 ENCOUNTER — Telehealth: Payer: Self-pay

## 2020-05-05 NOTE — Telephone Encounter (Signed)
Returned call to Pt to discuss test results for watchman.  While discussing test results Pt states she has had a problem with her blood pressure and some lower extremity edema.  States blood pressure last night of 168/11 and pulse 117.  States a few hours later blood pressure was 163/82 pulse 83.  Upon further investigation into current med list Pt states she has not been taking her triamterene-HCTZ.  States Dr. Marlou Porch told her not to take it.  Discussed with Dr. Marlou Porch.  Pt was advised to hold medication prior to CT, but was to resume after.  Advised Pt to restart her triamterene-HCTZ.  She will keep an eye on her blood pressure and will call if any abnormal readings.

## 2020-05-10 ENCOUNTER — Ambulatory Visit: Payer: Medicare PPO | Admitting: Podiatry

## 2020-05-17 ENCOUNTER — Ambulatory Visit (INDEPENDENT_AMBULATORY_CARE_PROVIDER_SITE_OTHER): Payer: Medicare PPO | Admitting: Podiatry

## 2020-05-17 ENCOUNTER — Other Ambulatory Visit: Payer: Self-pay

## 2020-05-17 ENCOUNTER — Encounter: Payer: Self-pay | Admitting: Podiatry

## 2020-05-17 DIAGNOSIS — B351 Tinea unguium: Secondary | ICD-10-CM

## 2020-05-17 DIAGNOSIS — E1169 Type 2 diabetes mellitus with other specified complication: Secondary | ICD-10-CM | POA: Diagnosis not present

## 2020-05-17 DIAGNOSIS — E1142 Type 2 diabetes mellitus with diabetic polyneuropathy: Secondary | ICD-10-CM | POA: Diagnosis not present

## 2020-05-17 NOTE — Progress Notes (Signed)
  Subjective:  Patient ID: [name], female    DOB: 1941/07/22,  MRN: [id number]  Chief Complaint  Patient presents with  . Nail Problem    trim nails   . Callouses    i do have a few calluses   79 y.o. female presents with the above complaint. History confirmed with patient.   Objective:  Physical Exam: warm, good capillary refill, onychomycosis of the toenails with pain to palpation of the nailbeds, no trophic changes or ulcerative lesions, normal DP and PT pulses, and absent protective sensation. Xerosis bilat. Left Foot: normal exam, no swelling, tenderness, instability; ligaments intact, full range of motion of all ankle/foot joints  Right Foot: normal exam, no swelling, tenderness, instability; ligaments intact, full range of motion of all ankle/foot joints   Assessment:   1. Onychomycosis of multiple toenails with type 2 diabetes mellitus and peripheral neuropathy ([place])      Plan:  Patient was evaluated and treated and all questions answered.  Onychomycosis, Diabetes and DPN -Patient is diabetic with a qualifying condition for at risk foot care. -No callus debridement warranted today -Recc lotion twice daily for xerosis   Procedure: Nail Debridement Rationale: Patient meets criteria for routine foot care due to DPN Type of Debridement: manual, sharp debridement. Instrumentation: Nail nipper, rotary burr. Number of Nails: 10           No follow-ups on file.

## 2020-05-19 ENCOUNTER — Telehealth: Payer: Self-pay | Admitting: *Deleted

## 2020-05-19 NOTE — Telephone Encounter (Signed)
Damian Leavell, RN  Shellia Cleverly, RN Just fyi, Pt needs to see Dr. Marlou Porch again to discuss watchman further.  She has had her cardiac CT for this workup.  Sonia Baller        Pt is scheduled with Truitt Merle to discuss CT results and watchman.

## 2020-05-21 DIAGNOSIS — Z4889 Encounter for other specified surgical aftercare: Secondary | ICD-10-CM | POA: Diagnosis not present

## 2020-05-21 DIAGNOSIS — S29019A Strain of muscle and tendon of unspecified wall of thorax, initial encounter: Secondary | ICD-10-CM | POA: Diagnosis not present

## 2020-05-25 ENCOUNTER — Ambulatory Visit: Payer: Medicare PPO | Admitting: Nurse Practitioner

## 2020-05-25 ENCOUNTER — Encounter: Payer: Self-pay | Admitting: Cardiology

## 2020-05-25 ENCOUNTER — Ambulatory Visit (INDEPENDENT_AMBULATORY_CARE_PROVIDER_SITE_OTHER): Payer: Medicare PPO | Admitting: Cardiology

## 2020-05-25 ENCOUNTER — Other Ambulatory Visit: Payer: Self-pay

## 2020-05-25 VITALS — BP 138/68 | HR 76 | Ht 64.0 in | Wt 155.0 lb

## 2020-05-25 DIAGNOSIS — Z8719 Personal history of other diseases of the digestive system: Secondary | ICD-10-CM

## 2020-05-25 DIAGNOSIS — E782 Mixed hyperlipidemia: Secondary | ICD-10-CM | POA: Diagnosis not present

## 2020-05-25 DIAGNOSIS — I480 Paroxysmal atrial fibrillation: Secondary | ICD-10-CM

## 2020-05-25 NOTE — Patient Instructions (Signed)

## 2020-05-25 NOTE — Progress Notes (Addendum)
Cardiology Office Note:    Date:  05/25/2020   ID:  [name], DOB 1941/03/14, MRN [id number]  PCP:  Leighton Ruff, MD  [facility] Cardiologist:  Candee Furbish, MD  [facility] Electrophysiologist:  Vickie Epley, MD   Referring MD: Leighton Ruff, MD     History of Present Illness:    [name] is a 79 y.o. female here for watchman discussion, atrial fibrillation.  Diabetes.  Previously had peptic ulcer bleeding, argon laser.  CT scan of heart reviewed from 04/23/2020: There is normal pulmonary vein drainage into the left atrium (2 on the right and 2 on the left) with ostial measurements as follows:  RUPV: 22.9 x 15.7 mm  RLPV: 16.9 x 13.8 mm  LUPV: 18.1 x 12.5 mm  LLPV: 15.9 x 11.8 mm  The left atrial appendage is very large with mixed chicken wing / broccoli morphology, with 1 major lobe, ostial size 22 x 21 mm and length 50 mm. There is no thrombus in the left atrial appendage.  The esophagus runs to the left from the left atrial midline and is in the proximity to the ostia of the LUPV and LLPV.  Aorta: Normal caliber. No dissection. Mild diffuse calcifications.  Aortic Valve: Trileaflet. Minimal calcifications.  Coronary Arteries: Normal coronary origin. Right dominance. The study was performed without use of NTG and insufficient for plaque evaluation.  Calcium score is 65 that represents 46 percentile for age/sex.  IMPRESSION: 1. There is normal pulmonary vein drainage into the left atrium.  2. The left atrial appendage is very large with mixed chicken wing / broccoli morphology, with 1 major lobe, ostial size 22 x 21 mm and length 50 mm. There is no thrombus in the left atrial appendage.  3. The esophagus runs to the left from the left atrial midline and is in the proximity to the ostia of the LUPV and LLPV.  Overall currently feels well no fevers chills nausea vomiting syncope bleeding.  Has had discussion  with Dr. Watt Climes of GI, she will likely continue to have GI bleeds if she maintains anticoagulation with Eliquis.   Past Medical History:  Diagnosis Date  . Acute GI bleeding 05/13/2015  . Anxiety   . Arthritis   . Asthma   . Atrial fibrillation ([place]) 10/29/2017  . Bakers cyst, left   . Chronic kidney disease    stage 3  . Constipation   . Diabetes mellitus    type 2  . Dysrhythmia   . GERD (gastroesophageal reflux disease)   . Heart murmur   . History of cellulitis   . History of dizziness   . History of hiatal hernia    tiny 06/15/2016  . Hypertension   . Iron deficiency anemia   . Peripheral vascular disease ([place])   . PONV (postoperative nausea and vomiting)   . Pulmonary nodule     Past Surgical History:  Procedure Laterality Date  . APPENDECTOMY  1952  . BIOPSY  10/16/2018   Procedure: BIOPSY;  Surgeon: Clarene Essex, MD;  Location: [facility];  Service: Endoscopy;;  . BIOPSY  11/26/2019   Procedure: BIOPSY;  Surgeon: Clarene Essex, MD;  Location: [facility];  Service: Endoscopy;;  . COLONOSCOPY W/ POLYPECTOMY  05/15/2013  . ESOPHAGOGASTRODUODENOSCOPY (EGD) WITH PROPOFOL N/A 06/15/2016   Procedure: ESOPHAGOGASTRODUODENOSCOPY (EGD) WITH PROPOFOL;  Surgeon: Clarene Essex, MD;  [facility]: [facility];  Service: Endoscopy;  Laterality: N/A;  . ESOPHAGOGASTRODUODENOSCOPY (EGD) WITH PROPOFOL N/A 10/16/2018   Procedure:  ESOPHAGOGASTRODUODENOSCOPY (EGD) WITH PROPOFOL;  Surgeon: Clarene Essex, MD;  Location: [facility];  Service: Endoscopy;  Laterality: N/A;  . ESOPHAGOGASTRODUODENOSCOPY (EGD) WITH PROPOFOL N/A 11/26/2019   Procedure: ESOPHAGOGASTRODUODENOSCOPY (EGD) WITH PROPOFOL;  Surgeon: Clarene Essex, MD;  [facility]: [facility];  Service: Endoscopy;  Laterality: N/A;  . EYE SURGERY Bilateral    cataracts removed  . GI RADIOFREQUENCY ABLATION  10/16/2018   Procedure: GI RADIOFREQUENCY ABLATION;  Surgeon: Clarene Essex, MD;  Location: [facility];  Service: Endoscopy;;  . GI  RADIOFREQUENCY ABLATION N/A 11/26/2019   Procedure: GI RADIOFREQUENCY ABLATION;  Surgeon: Clarene Essex, MD;  Location: [facility];  Service: Endoscopy;  Laterality: N/A;  . HAND SURGERY Left 2010   operated on index and ring finger  . INCONTINENCE SURGERY    . JOINT REPLACEMENT Right    knee  . KNEE ARTHROSCOPY  2003 2005   right knee  . KYPHOPLASTY N/A 01/29/2020   Procedure: T8 KYPHOPLASTY;  Surgeon: Melina Schools, MD;  Location: [place];  Service: Orthopedics;  Laterality: N/A;  60 mins Local with [facility]  . mass fallopian tube    . SPINAL CORD STIMULATOR BATTERY EXCHANGE N/A 09/09/2015   Procedure: SPINAL CORD STIMULATOR BATTERY EXCHANGE;  Surgeon: Melina Schools, MD;  Location: [place];  Service: Orthopedics;  Laterality: N/A;  . TOTAL KNEE ARTHROPLASTY Left 11/05/2017   Procedure: LEFT TOTAL KNEE ARTHROPLASTY;  Surgeon: Paralee Cancel, MD;  Location: [facility];  Service: Orthopedics;  Laterality: Left;  Adductor Block  . VAGINAL HYSTERECTOMY  1977   1 ovary removed    Current Medications: Current Meds  Medication Sig  . albuterol (PROAIR HFA) 108 (90 BASE) MCG/ACT inhaler Inhale 2 puffs into the lungs every 4 (four) hours as needed for wheezing or shortness of breath.  . ALPRAZolam (XANAX) 0.25 MG tablet Take 1 tablet by mouth in the morning and at bedtime.  Marland Kitchen amLODipine (NORVASC) 10 MG tablet Take 10 mg by mouth daily.  . DROPLET PEN NEEDLES 32G X 4 MM MISC   . ELIQUIS 5 MG TABS tablet TAKE 1 TABLET TWICE DAILY  . esomeprazole (NEXIUM) 40 MG capsule Take 40 mg by mouth in the morning and at bedtime. Morning & afternoon.  . gabapentin (NEURONTIN) 300 MG capsule Take 600 mg by mouth in the morning, at noon, and at bedtime.   . Insulin Glargine (LANTUS SOLOSTAR) 100 UNIT/ML Solostar Pen Inject 32 Units into the skin at bedtime.   . Iron-FA-B Cmp-C-Biot-Probiotic (FUSION PLUS) CAPS Take 1 capsule by mouth every evening.   Marland Kitchen losartan (COZAAR) 100 MG tablet Take 100 mg by mouth daily.  .  metoprolol (LOPRESSOR) 50 MG tablet Take 75-100 mg by mouth See admin instructions. Take 2 tablets (100 mg) by mouth in the morning & take 1.5 tablets (75 mg) by mouth in the evening.  . Multiple Vitamins-Minerals (PRESERVISION AREDS 2 PO) Take 1 capsule by mouth 2 (two) times daily.   Marland Kitchen NOVOLOG FLEXPEN 100 UNIT/ML FlexPen Inject 12-16 Units into the skin in the morning, at noon, and at bedtime. Sliding scale insulin  . Pitavastatin Calcium (LIVALO) 1 MG TABS Take 1 mg by mouth at bedtime.  . sucralfate (CARAFATE) 1 g tablet Take 1 g by mouth in the morning and at bedtime.  . triamterene-hydrochlorothiazide (MAXZIDE-25) 37.5-25 MG tablet Take 2 capsules by mouth daily.   Marland Kitchen venlafaxine (EFFEXOR) 37.5 MG tablet Take 37.5 mg by mouth 2 (two) times daily.     Allergies:   Vasotec, Atorvastatin, Codeine,  Cymbalta [duloxetine hcl], Lansoprazole, Morphine and related, Sulfate, Adhesive [tape], and Scopolamine   Social History   Socioeconomic History  . Marital status: Married    Spouse name: Charlotte Crumb  . Number of children: 2  . Years of education: 41  . Highest education level: Not on file  Occupational History    Comment: retired Pharmacist, hospital, IT sales professional  Tobacco Use  . Smoking status: Never Smoker  . Smokeless tobacco: Never Used  Vaping Use  . Vaping Use: Never used  Substance and Sexual Activity  . Alcohol use: No  . Drug use: No  . Sexual activity: Never    Birth control/protection: Surgical  Other Topics Concern  . Not on file  Social History Narrative   Lives with husband   Caffeine use- none   Social Determinants of Health   Financial Resource Strain:   . Difficulty of Paying Living Expenses: Not on file  Food Insecurity:   . Worried About Charity fundraiser in the Last Year: Not on file  . Ran Out of Food in the Last Year: Not on file  Transportation Needs:   . Lack of Transportation (Medical): Not on file  . Lack of Transportation (Non-Medical): Not on file  Physical  Activity:   . Days of Exercise per Week: Not on file  . Minutes of Exercise per Session: Not on file  Stress:   . Feeling of Stress : Not on file  Social Connections:   . Frequency of Communication with Friends and Family: Not on file  . Frequency of Social Gatherings with Friends and Family: Not on file  . Attends Religious Services: Not on file  . Active Member of Clubs or Organizations: Not on file  . Attends Archivist Meetings: Not on file  . Marital Status: Not on file     Family History: The patient's family history includes Heart attack in her father; Lung cancer in her mother. There is no history of Colon cancer, Stroke, or Migraines.  ROS:   Please see the history of present illness.     All other systems reviewed and are negative.  EKGs/Labs/Other Studies Reviewed:     Recent Labs: 01/27/2020: Hemoglobin 12.6; Platelets 249 04/23/2020: BUN 21; Creatinine, Ser 1.32; Potassium 4.4; Sodium 132  Recent Lipid Panel No results found for: CHOL, TRIG, HDL, CHOLHDL, VLDL, LDLCALC, LDLDIRECT  Physical Exam:    VS:  BP 138/68   Pulse 76   Ht 5\' 4"  (1.626 m)   Wt 155 lb (70.3 kg)   SpO2 96%   BMI 26.61 kg/m     Wt Readings from Last 3 Encounters:  05/25/20 155 lb (70.3 kg)  04/14/20 155 lb (70.3 kg)  01/29/20 152 lb (68.9 kg)     GEN:  Well nourished, well developed in no acute distress HEENT: Normal NECK: No JVD; No carotid bruits LYMPHATICS: No lymphadenopathy CARDIAC: RRR, no murmurs, rubs, gallops RESPIRATORY:  Clear to auscultation without rales, wheezing or rhonchi  ABDOMEN: Soft, non-tender, non-distended MUSCULOSKELETAL:  No edema; No deformity  SKIN: Warm and dry NEUROLOGIC:  Alert and oriented x 3 PSYCHIATRIC:  Normal affect   ASSESSMENT:    1. Paroxysmal atrial fibrillation (HCC)   2. History of GI bleed   3. Mixed hyperlipidemia    PLAN:    In order of problems listed above:  Paroxysmal atrial fibrillation -Currently on Eliquis  she has had recurrent peptic ulcer bleeds.  Discussed with Dr. Watt Climes.  At risk for future  bleeding. -Appreciate Dr. Quentin Ore with EP evaluating for potential watchman device.  CT scan reviewed as above.  Discussed with her risks and benefits of device.  She is willing to proceed. -I do believe that continued long-term use of anticoagulation would result in future GI bleeds, Dr. Watt Climes has discussed this with her. I have seen [name] in the office today.  She is up for consideration for Left Atrial Appendage Closure with the Watchman Device for management of stroke risk. Based upon past history, it has been determined that she is a poor candidate for long-term oral anticoagulation.  However, she may be tolerant of short-term treatment with an anticoagulant as necessary.  A shared decision has been made utilizing the [employer] of Cardiology shared decision tool to undergo Left Atrial Appendage Closure with Watchman device at this time.     Essential hypertension -Well controlled.  Cozaar.  Creatinine was slightly higher recently 1.32.  Continue to hydrate.   Hyperlipidemia -LDL 95, on [place].  No myalgias.  Has coronary artery calcification.   Medication Adjustments/Labs and Tests Ordered: Current medicines are reviewed at length with the patient today.  Concerns regarding medicines are outlined above.  No orders of the defined types were placed in this encounter.  No orders of the defined types were placed in this encounter.   Patient Instructions  Medication Instructions:  The current medical regimen is effective;  continue present plan and medications.  *If you need a refill on your cardiac medications before your next appointment, please call your pharmacy*  Follow-Up: At [facility], you and your health needs are our priority.  As part of our continuing mission to provide you with exceptional heart care, we have created designated Provider Care Teams.   These Care Teams include your primary Cardiologist (physician) and Advanced Practice Providers (APPs -  Physician Assistants and Nurse Practitioners) who all work together to provide you with the care you need, when you need it.  We recommend signing up for the patient portal called "MyChart".  Sign up information is provided on this After Visit Summary.  MyChart is used to connect with patients for Virtual Visits (Telemedicine).  Patients are able to view lab/test results, encounter notes, upcoming appointments, etc.  Non-urgent messages can be sent to your provider as well.   To learn more about what you can do with MyChart, go to [email or web address].    Your next appointment:   6 month(s)  The format for your next appointment:   In Person  Provider:   Candee Furbish, MD   Thank you for choosing [facility]!!         Signed, Candee Furbish, MD  05/25/2020 3:37 PM    [place]

## 2020-05-27 DIAGNOSIS — Z1389 Encounter for screening for other disorder: Secondary | ICD-10-CM | POA: Diagnosis not present

## 2020-05-27 DIAGNOSIS — Z23 Encounter for immunization: Secondary | ICD-10-CM | POA: Diagnosis not present

## 2020-05-27 DIAGNOSIS — Z0000 Encounter for general adult medical examination without abnormal findings: Secondary | ICD-10-CM | POA: Diagnosis not present

## 2020-05-31 ENCOUNTER — Other Ambulatory Visit: Payer: Self-pay | Admitting: Family Medicine

## 2020-05-31 DIAGNOSIS — Z1231 Encounter for screening mammogram for malignant neoplasm of breast: Secondary | ICD-10-CM

## 2020-06-02 ENCOUNTER — Telehealth: Payer: Self-pay | Admitting: Nurse Practitioner

## 2020-06-02 NOTE — Telephone Encounter (Signed)
Spoke with patient this morning about her upcoming Watchman procedure on November 4th. We also discussed pre procedure testing and I shared my contact information as the [place] coordinator for any questions or concerns.

## 2020-06-03 DIAGNOSIS — R296 Repeated falls: Secondary | ICD-10-CM | POA: Diagnosis not present

## 2020-06-03 DIAGNOSIS — D509 Iron deficiency anemia, unspecified: Secondary | ICD-10-CM | POA: Diagnosis not present

## 2020-06-03 DIAGNOSIS — G479 Sleep disorder, unspecified: Secondary | ICD-10-CM | POA: Diagnosis not present

## 2020-06-03 DIAGNOSIS — Q208 Other congenital malformations of cardiac chambers and connections: Secondary | ICD-10-CM | POA: Diagnosis not present

## 2020-06-03 DIAGNOSIS — N183 Chronic kidney disease, stage 3 unspecified: Secondary | ICD-10-CM | POA: Diagnosis not present

## 2020-06-03 DIAGNOSIS — F418 Other specified anxiety disorders: Secondary | ICD-10-CM | POA: Diagnosis not present

## 2020-06-14 ENCOUNTER — Telehealth: Payer: Self-pay

## 2020-06-14 ENCOUNTER — Telehealth: Payer: Self-pay | Admitting: Nurse Practitioner

## 2020-06-14 DIAGNOSIS — Z8719 Personal history of other diseases of the digestive system: Secondary | ICD-10-CM

## 2020-06-14 DIAGNOSIS — I480 Paroxysmal atrial fibrillation: Secondary | ICD-10-CM

## 2020-06-14 NOTE — Telephone Encounter (Signed)
Contacted the patient and discussed her upcoming WATCHMAN procedure. I provided the procedure time and informed her that she will receive information about pre procedure via my chart. I provided my contact information for any questions or concerns before November 4th.  Ambrose Pancoast, RN

## 2020-06-14 NOTE — Telephone Encounter (Signed)
Appt made for June 29, 2020 at 11:00 am with Dr. Quentin Ore to update H&P, schedule labs and give Pt pre procedure instructions.

## 2020-06-17 DIAGNOSIS — Z794 Long term (current) use of insulin: Secondary | ICD-10-CM | POA: Diagnosis not present

## 2020-06-17 DIAGNOSIS — D649 Anemia, unspecified: Secondary | ICD-10-CM | POA: Diagnosis not present

## 2020-06-17 DIAGNOSIS — E1142 Type 2 diabetes mellitus with diabetic polyneuropathy: Secondary | ICD-10-CM | POA: Diagnosis not present

## 2020-06-17 DIAGNOSIS — E1165 Type 2 diabetes mellitus with hyperglycemia: Secondary | ICD-10-CM | POA: Diagnosis not present

## 2020-06-17 DIAGNOSIS — E878 Other disorders of electrolyte and fluid balance, not elsewhere classified: Secondary | ICD-10-CM | POA: Diagnosis not present

## 2020-06-18 ENCOUNTER — Ambulatory Visit
Admission: RE | Admit: 2020-06-18 | Discharge: 2020-06-18 | Disposition: A | Discharge status: Home / Self Care | Payer: Medicare PPO | Source: Ambulatory Visit | Attending: Family Medicine | Admitting: Family Medicine

## 2020-06-18 ENCOUNTER — Other Ambulatory Visit: Payer: Self-pay

## 2020-06-18 DIAGNOSIS — Z1231 Encounter for screening mammogram for malignant neoplasm of breast: Secondary | ICD-10-CM

## 2020-06-21 DIAGNOSIS — E1142 Type 2 diabetes mellitus with diabetic polyneuropathy: Secondary | ICD-10-CM | POA: Diagnosis not present

## 2020-06-23 ENCOUNTER — Other Ambulatory Visit: Payer: Self-pay | Admitting: Family Medicine

## 2020-06-23 DIAGNOSIS — R928 Other abnormal and inconclusive findings on diagnostic imaging of breast: Secondary | ICD-10-CM

## 2020-06-24 ENCOUNTER — Other Ambulatory Visit: Payer: Self-pay

## 2020-06-24 ENCOUNTER — Encounter: Payer: Self-pay | Admitting: Physical Therapy

## 2020-06-24 ENCOUNTER — Ambulatory Visit: Payer: Medicare PPO | Attending: Family Medicine | Admitting: Physical Therapy

## 2020-06-24 DIAGNOSIS — R2689 Other abnormalities of gait and mobility: Secondary | ICD-10-CM | POA: Diagnosis not present

## 2020-06-24 DIAGNOSIS — R296 Repeated falls: Secondary | ICD-10-CM | POA: Insufficient documentation

## 2020-06-24 NOTE — Therapy (Signed)
[place], [place], [address] Phone: [phone]   Fax:  [phone]  Physical Therapy Evaluation  Patient Details  Name: [name] MRN: [id number] Date of Birth: October 08, 1940 Referring Provider (PT): Dr Leighton Ruff    Encounter Date: 06/24/2020   PT End of Session - 06/24/20 1114    Visit Number 1    Number of Visits 12    Date for PT Re-Evaluation 08/05/20    Authorization Type [employer] [place] [facility]    PT Start Time 1100    PT Stop Time 1145    PT Time Calculation (min) 45 min    Activity Tolerance Patient tolerated treatment well    Behavior During Therapy [facility] for tasks assessed/performed           Past Medical History:  Diagnosis Date  . Acute GI bleeding 05/13/2015  . Anxiety   . Arthritis   . Asthma   . Atrial fibrillation ([place]) 10/29/2017  . Bakers cyst, left   . Chronic kidney disease    stage 3  . Constipation   . Diabetes mellitus    type 2  . Dysrhythmia   . GERD (gastroesophageal reflux disease)   . Heart murmur   . History of cellulitis   . History of dizziness   . History of hiatal hernia    tiny 06/15/2016  . Hypertension   . Iron deficiency anemia   . Peripheral vascular disease ([place])   . PONV (postoperative nausea and vomiting)   . Pulmonary nodule     Past Surgical History:  Procedure Laterality Date  . APPENDECTOMY  1952  . BIOPSY  10/16/2018   Procedure: BIOPSY;  Surgeon: Clarene Essex, MD;  [facility];  Service: Endoscopy;;  . BIOPSY  11/26/2019   Procedure: BIOPSY;  Surgeon: Clarene Essex, MD;  [facility];  Service: Endoscopy;;  . COLONOSCOPY W/ POLYPECTOMY  05/15/2013  . ESOPHAGOGASTRODUODENOSCOPY (EGD) WITH PROPOFOL N/A 06/15/2016   Procedure: ESOPHAGOGASTRODUODENOSCOPY (EGD) WITH PROPOFOL;  Surgeon: Clarene Essex, MD;  [facility];  Service: Endoscopy;  Laterality: N/A;  . ESOPHAGOGASTRODUODENOSCOPY (EGD) WITH  PROPOFOL N/A 10/16/2018   Procedure: ESOPHAGOGASTRODUODENOSCOPY (EGD) WITH PROPOFOL;  Surgeon: Clarene Essex, MD;  [facility];  Service: Endoscopy;  Laterality: N/A;  . ESOPHAGOGASTRODUODENOSCOPY (EGD) WITH PROPOFOL N/A 11/26/2019   Procedure: ESOPHAGOGASTRODUODENOSCOPY (EGD) WITH PROPOFOL;  Surgeon: Clarene Essex, MD;  [facility];  Service: Endoscopy;  Laterality: N/A;  . EYE SURGERY Bilateral    cataracts removed  . GI RADIOFREQUENCY ABLATION  10/16/2018   Procedure: GI RADIOFREQUENCY ABLATION;  Surgeon: Clarene Essex, MD;  [facility];  Service: Endoscopy;;  . GI RADIOFREQUENCY ABLATION N/A 11/26/2019   Procedure: GI RADIOFREQUENCY ABLATION;  Surgeon: Clarene Essex, MD;  [facility];  Service: Endoscopy;  Laterality: N/A;  . HAND SURGERY Left 2010   operated on index and ring finger  . INCONTINENCE SURGERY    . JOINT REPLACEMENT Right    knee  . KNEE ARTHROSCOPY  2003 2005   right knee  . KYPHOPLASTY N/A 01/29/2020   Procedure: T8 KYPHOPLASTY;  Surgeon: Melina Schools, MD;  Location: [place];  Service: Orthopedics;  Laterality: N/A;  60 mins Local with [facility]  . mass fallopian tube    . SPINAL CORD STIMULATOR BATTERY EXCHANGE N/A 09/09/2015   Procedure: SPINAL CORD STIMULATOR BATTERY EXCHANGE;  Surgeon: Melina Schools, MD;  Location: [place];  Service: Orthopedics;  Laterality: N/A;  .  TOTAL KNEE ARTHROPLASTY Left 11/05/2017   Procedure: LEFT TOTAL KNEE ARTHROPLASTY;  Surgeon: Paralee Cancel, MD;  [facility];  Service: Orthopedics;  Laterality: Left;  Adductor Block  . VAGINAL HYSTERECTOMY  1977   1 ovary removed    There were no vitals filed for this visit.    Subjective Assessment - 06/24/20 1107    Subjective Patient has been having frequent falls since September. Her most recent fall was 2 weeks ago. She fell on her bottom and hurt her back. She used a walker for ablut a week. She i now back to using a cane.    Pertinent History anemia,  DMII, peripheral neyuropathy, bilateral knee replacement, PVD, cardiac heart murmur.    Limitations Standing;Walking    How long can you walk comfortably? can walk but has frequent falls.    Patient Stated Goals to fall less    Currently in Pain? Yes    Pain Score 5     Pain Location Back    Pain Orientation Left    Pain Descriptors / Indicators Aching    Pain Type Chronic pain    Pain Onset More than a month ago    Pain Frequency Constant    Aggravating Factors  bed    Pain Relieving Factors rest    Multiple Pain Sites No              [facility] PT Assessment - 06/24/20 0001      Assessment   Medical Diagnosis Frequent falls     Referring Provider (PT) Dr Leighton Ruff     Hand Dominance Right    Next MD Visit nothing with Dr Drema Dallas     Prior Therapy Fro bilateral knees and hand       Precautions   Precautions None      Restrictions   Weight Bearing Restrictions No      Balance Screen   Has the patient fallen in the past 6 months Yes    How many times? --   Has had several. can not recall exact number    Has the patient had a decrease in activity level because of a fear of falling?  No    Is the patient reluctant to leave their home because of a fear of falling?  No      Home Environment   Additional Comments FSteps from the garage into the house       Prior Function   Level of Independence Independent;Independent with community mobility with device    Vocation Retired    Leisure hinders her ability to walk       Cognition   Overall Cognitive Status Within Functional Limits for tasks assessed    Attention Focused      Sensation   Light Touch Appears Intact    Additional Comments numbness in bilateral LE       Coordination   Gross Motor Movements are Fluid and Coordinated Yes    Fine Motor Movements are Fluid and Coordinated Yes      ROM / Strength   AROM / PROM / Strength AROM;PROM;Strength      Strength   Strength Assessment Site Knee;Hip     Right/Left Hip Right;Left    Right Hip Flexion 4/5    Right Hip ABduction 4+/5    Right Hip ADduction 4+/5    Left Hip Flexion 4/5    Left Hip ABduction 4+/5    Left Hip ADduction 4+/5    Right/Left  Knee Right;Left    Right Knee Flexion 5/5    Right Knee Extension 4+/5    Left Knee Flexion 5/5    Left Knee Extension 4+/5      Palpation   Palpation comment mild tenderness to palpation in left hip and dlumbar spine       Transfers   Comments 5x sit to stand       Ambulation/Gait   Gait Comments Decreased stability  onm the right side. patient almost falls onto the right side. therapy checked leg length but no descrepency.  Will benefit from changing her cane to the left but will need training to do so.                       Objective measurements completed on examination: See above findings.       [facility] Adult PT Treatment/Exercise - 06/24/20 0001      Knee/Hip Exercises: Seated   Long Arc Quad Limitations x15 red band     Heel Slides Limitations with red band 2x10     Clamshell with TheraBand Red   2x10                  PT Education - 06/24/20 1615    Education Details reviewed benefits of balance training; POC; HEP; improtance of clearance from cardiolgist    Person(s) Educated Patient    Methods Explanation;Demonstration;Tactile cues;Verbal cues    Comprehension Returned demonstration;Verbal cues required;Tactile cues required;Verbalized understanding            PT Short Term Goals - 06/24/20 1625      PT SHORT TERM GOAL #1   Title Patient will perfrom BERG balance test    Time 3    Period Weeks    Status New    Target Date 07/15/20      PT SHORT TERM GOAL #2   Title Patient will improve gross bilateral LE strength to 5/5    Time 3    Period Weeks    Status New    Target Date 07/15/20      PT SHORT TERM GOAL #3   Title Patient will stand with narrow base eyes closed with contact gaurd    Time 3    Period Weeks    Status New     Target Date 07/15/20             PT Long Term Goals - 06/24/20 1626      PT LONG TERM GOAL #1   Title Patient will decrease TUG time to 11 seconds to demonstrate decreased flals risk    Time 6    Period Weeks    Status New    Target Date 08/05/20      PT LONG TERM GOAL #2   Title Patient will report no falls for a 3 week time frame    Time 6    Period Weeks    Status New    Target Date 08/05/20      PT LONG TERM GOAL #3   Title Patient will ambualte 2000' safely with least restrictive AD    Time 6    Period Weeks    Status New    Target Date 08/05/20                  Plan - 06/24/20 1617    Clinical Impression Statement Patient is a 79 year old female with frewquent falls. Her most recent was  2 weeks ago resulting in left sided low back pain. Per TUG and 5x sit to stand test the patient is a high fall risk. She requires min a for balance with narrow base of support and she can not come to a full tadem stance at this time. She uses a cane outside the home. In the home is where she has most of her falls though. She was encouraged to use the cane inside and even consder the walker until her balance improves. She would benefit from skilled therapy to improve her balance and to work on strategies to decrease her falls. The patient will have a cardiac surgery on 11/4. She will need to be cleared bysurgeon prior to resuming therapy.    Personal Factors and Comorbidities Comorbidity 1;Comorbidity 2;Comorbidity 3+    Comorbidities peripheral neuropathy, bilateral knee replacements, chronic anemia, upcoming    Examination-Activity Limitations Carry;Stand;Transfers    Examination-Participation Restrictions Cleaning;Community Activity;Laundry    Stability/Clinical Decision Making Evolving/Moderate complexity    Clinical Decision Making Moderate    Rehab Potential Good    PT Frequency 2x / week    PT Duration 6 weeks    PT Treatment/Interventions ADLs/Self Care Home  Management;Cryotherapy;DME Instruction;Gait training;Stair training;Moist Heat;Therapeutic activities;Therapeutic exercise;Neuromuscular re-education;Passive range of motion;Taping    PT Next Visit Plan BERG next visit; continue with balance progression; work towards tandem stance; cosnder stit to stand transfers; her right leg appears weaker; consder cane training on the left; She drifts to the right; continue to monitor the lowe back. The pain is acute; consider light lumbar stretching;    PT Home Exercise Plan laq; hip abduction; hanmstring curls all given for gross LE strengthening    Consulted and Agree with Plan of Care Patient           Patient will benefit from skilled therapeutic intervention in order to improve the following deficits and impairments:  Abnormal gait, Increased muscle spasms, Decreased endurance, Decreased activity tolerance, Decreased strength, Pain, Decreased knowledge of use of DME, Decreased balance, Improper body mechanics, Decreased safety awareness, Difficulty walking  Visit Diagnosis: Repeated falls  Other abnormalities of gait and mobility     Problem List Patient Active Problem List   Diagnosis Date Noted  . Paroxysmal atrial fibrillation ([place]) 04/14/2020  . S/P left TKA 11/05/2017  . S/P total knee replacement 11/05/2017  . Diabetes mellitus ([place]) 12/25/2016  . Hypercholesterolemia 12/25/2016  . Ptosis of eyelid 10/09/2016  . Iron deficiency anemia 12/15/2015  . Acute GI bleeding 05/13/2015  . Acute blood loss anemia 05/13/2015  . Normocytic anemia 05/13/2015  . Allergic rhinitis 04/06/2015  . Asthma, chronic 09/08/2013  . Hypoxia 09/08/2013  . HTN (hypertension) 09/08/2013  . Depression 09/08/2013  . DM type 2 with diabetic peripheral neuropathy ([place]) 09/08/2013  . Flu-like symptoms 09/07/2013    Carney Living PT DPT  06/24/2020, 4:34 PM  [facility] [address] [place],  [place], [address] Phone: [phone]   Fax:  [phone]  Name: [name] MRN: [id number] Date of Birth: May 22, 1941

## 2020-06-24 NOTE — Patient Instructions (Signed)
Access Code: [email or web address] URL: [email or web address] Date: 06/24/2020 Prepared by: Carolyne Littles  Exercises .Seated Knee Extension with Resistance - 1 x daily - 7 x weekly - 3 sets - 10 reps .Seated Hip Abduction with Resistance - 1 x daily - 7 x weekly - 3 sets - 10 reps .Seated Hamstring Curls with Resistance - 1 x daily - 7 x weekly - 3 sets - 10 reps

## 2020-06-25 ENCOUNTER — Telehealth: Payer: Self-pay | Admitting: Cardiology

## 2020-06-25 NOTE — Telephone Encounter (Signed)
Spoke with the pt and informed her that I moved her covid screen test to a later time on 11/2, so that she is able to see Dr. Quentin Ore in the office same day, have her labs drawn here, then drive to the covid testing site thereafter, at 1:10 pm.  Advised the pt to report to her covid test on 11/2 at 1:10 pm at  [facility] [place], [place] [address].  Pt verbalized understanding and agrees with this plan. Pt was more than gracious for all the assistance provided.  Will send this message to Dr. Claudie Revering RN as a general FYI.

## 2020-06-25 NOTE — Telephone Encounter (Signed)
Pt is concerned that she will not have enough time to make it to her COVID test on 11/02 after her appointment with Dr. Quentin Ore. She wanted to know if that time could be pushed back

## 2020-06-29 ENCOUNTER — Other Ambulatory Visit: Payer: Self-pay | Admitting: Family Medicine

## 2020-06-29 ENCOUNTER — Ambulatory Visit (INDEPENDENT_AMBULATORY_CARE_PROVIDER_SITE_OTHER): Payer: Medicare PPO | Admitting: Cardiology

## 2020-06-29 ENCOUNTER — Other Ambulatory Visit (HOSPITAL_COMMUNITY)
Admission: RE | Admit: 2020-06-29 | Discharge: 2020-06-29 | Disposition: A | Discharge status: Home / Self Care | Payer: Medicare PPO | Source: Ambulatory Visit | Attending: Cardiology | Admitting: Cardiology

## 2020-06-29 ENCOUNTER — Encounter: Payer: Self-pay | Admitting: Cardiology

## 2020-06-29 ENCOUNTER — Other Ambulatory Visit: Payer: Medicare PPO

## 2020-06-29 ENCOUNTER — Other Ambulatory Visit (HOSPITAL_COMMUNITY): Payer: Medicare PPO

## 2020-06-29 ENCOUNTER — Other Ambulatory Visit: Payer: Self-pay

## 2020-06-29 VITALS — BP 130/60 | HR 68 | Ht 65.0 in | Wt 153.2 lb

## 2020-06-29 DIAGNOSIS — I480 Paroxysmal atrial fibrillation: Secondary | ICD-10-CM | POA: Diagnosis present

## 2020-06-29 DIAGNOSIS — K922 Gastrointestinal hemorrhage, unspecified: Secondary | ICD-10-CM | POA: Diagnosis present

## 2020-06-29 DIAGNOSIS — M1990 Unspecified osteoarthritis, unspecified site: Secondary | ICD-10-CM | POA: Diagnosis present

## 2020-06-29 DIAGNOSIS — I10 Essential (primary) hypertension: Secondary | ICD-10-CM | POA: Diagnosis present

## 2020-06-29 DIAGNOSIS — Z8719 Personal history of other diseases of the digestive system: Secondary | ICD-10-CM | POA: Diagnosis not present

## 2020-06-29 DIAGNOSIS — Z8249 Family history of ischemic heart disease and other diseases of the circulatory system: Secondary | ICD-10-CM | POA: Diagnosis not present

## 2020-06-29 DIAGNOSIS — Z801 Family history of malignant neoplasm of trachea, bronchus and lung: Secondary | ICD-10-CM | POA: Diagnosis not present

## 2020-06-29 DIAGNOSIS — Z794 Long term (current) use of insulin: Secondary | ICD-10-CM | POA: Diagnosis not present

## 2020-06-29 DIAGNOSIS — Z91048 Other nonmedicinal substance allergy status: Secondary | ICD-10-CM | POA: Diagnosis not present

## 2020-06-29 DIAGNOSIS — Z20822 Contact with and (suspected) exposure to covid-19: Secondary | ICD-10-CM | POA: Diagnosis present

## 2020-06-29 DIAGNOSIS — E1151 Type 2 diabetes mellitus with diabetic peripheral angiopathy without gangrene: Secondary | ICD-10-CM | POA: Diagnosis present

## 2020-06-29 DIAGNOSIS — I313 Pericardial effusion (noninflammatory): Secondary | ICD-10-CM | POA: Diagnosis present

## 2020-06-29 DIAGNOSIS — Z01812 Encounter for preprocedural laboratory examination: Secondary | ICD-10-CM | POA: Insufficient documentation

## 2020-06-29 DIAGNOSIS — Z7901 Long term (current) use of anticoagulants: Secondary | ICD-10-CM | POA: Diagnosis not present

## 2020-06-29 DIAGNOSIS — Z885 Allergy status to narcotic agent status: Secondary | ICD-10-CM | POA: Diagnosis not present

## 2020-06-29 DIAGNOSIS — E1142 Type 2 diabetes mellitus with diabetic polyneuropathy: Secondary | ICD-10-CM | POA: Diagnosis not present

## 2020-06-29 DIAGNOSIS — K219 Gastro-esophageal reflux disease without esophagitis: Secondary | ICD-10-CM | POA: Diagnosis present

## 2020-06-29 DIAGNOSIS — Z79899 Other long term (current) drug therapy: Secondary | ICD-10-CM | POA: Diagnosis not present

## 2020-06-29 DIAGNOSIS — E7800 Pure hypercholesterolemia, unspecified: Secondary | ICD-10-CM | POA: Diagnosis not present

## 2020-06-29 DIAGNOSIS — Z882 Allergy status to sulfonamides status: Secondary | ICD-10-CM | POA: Diagnosis not present

## 2020-06-29 DIAGNOSIS — I4819 Other persistent atrial fibrillation: Secondary | ICD-10-CM | POA: Diagnosis not present

## 2020-06-29 DIAGNOSIS — M8580 Other specified disorders of bone density and structure, unspecified site: Secondary | ICD-10-CM

## 2020-06-29 DIAGNOSIS — J45909 Unspecified asthma, uncomplicated: Secondary | ICD-10-CM | POA: Diagnosis present

## 2020-06-29 DIAGNOSIS — D509 Iron deficiency anemia, unspecified: Secondary | ICD-10-CM | POA: Diagnosis present

## 2020-06-29 DIAGNOSIS — Z006 Encounter for examination for normal comparison and control in clinical research program: Secondary | ICD-10-CM | POA: Diagnosis not present

## 2020-06-29 DIAGNOSIS — Z888 Allergy status to other drugs, medicaments and biological substances status: Secondary | ICD-10-CM | POA: Diagnosis not present

## 2020-06-29 DIAGNOSIS — F418 Other specified anxiety disorders: Secondary | ICD-10-CM | POA: Diagnosis not present

## 2020-06-29 DIAGNOSIS — F419 Anxiety disorder, unspecified: Secondary | ICD-10-CM | POA: Diagnosis present

## 2020-06-29 LAB — CBC WITH DIFFERENTIAL/PLATELET
Basophils Absolute: 0.1 10*3/uL (ref 0.0–0.2)
Basos: 1 %
EOS (ABSOLUTE): 0.2 10*3/uL (ref 0.0–0.4)
Eos: 2 %
Hematocrit: 33.5 % — ABNORMAL LOW (ref 34.0–46.6)
Hemoglobin: 10.8 g/dL — ABNORMAL LOW (ref 11.1–15.9)
Immature Grans (Abs): 0 10*3/uL (ref 0.0–0.1)
Immature Granulocytes: 0 %
Lymphocytes Absolute: 1.1 10*3/uL (ref 0.7–3.1)
Lymphs: 13 %
MCH: 29.1 pg (ref 26.6–33.0)
MCHC: 32.2 g/dL (ref 31.5–35.7)
MCV: 90 fL (ref 79–97)
Monocytes Absolute: 0.8 10*3/uL (ref 0.1–0.9)
Monocytes: 9 %
Neutrophils Absolute: 6.5 10*3/uL (ref 1.4–7.0)
Neutrophils: 75 %
Platelets: 311 10*3/uL (ref 150–450)
RBC: 3.71 x10E6/uL — ABNORMAL LOW (ref 3.77–5.28)
RDW: 12.7 % (ref 11.7–15.4)
WBC: 8.6 10*3/uL (ref 3.4–10.8)

## 2020-06-29 LAB — BASIC METABOLIC PANEL
BUN/Creatinine Ratio: 16 (ref 12–28)
BUN: 21 mg/dL (ref 8–27)
CO2: 24 mmol/L (ref 20–29)
Calcium: 9.2 mg/dL (ref 8.7–10.3)
Chloride: 99 mmol/L (ref 96–106)
Creatinine, Ser: 1.3 mg/dL — ABNORMAL HIGH (ref 0.57–1.00)
GFR calc Af Amer: 45 mL/min/{1.73_m2} — ABNORMAL LOW (ref >59)
GFR calc non Af Amer: 39 mL/min/{1.73_m2} — ABNORMAL LOW (ref >59)
Glucose: 179 mg/dL — ABNORMAL HIGH (ref 65–99)
Potassium: 4.2 mmol/L (ref 3.5–5.2)
Sodium: 137 mmol/L (ref 134–144)

## 2020-06-29 LAB — SARS CORONAVIRUS 2 (TAT 6-24 HRS): SARS Coronavirus 2: NEGATIVE

## 2020-06-29 NOTE — H&P (View-Only) (Signed)
Electrophysiology Office Note:    Date:  06/29/2020   ID:  [name], DOB 1940/11/17, MRN [id number]  PCP:  Leighton Ruff, MD  [facility] Cardiologist:  Candee Furbish, MD  [facility] Electrophysiologist:  Vickie Epley, MD   Referring MD: Leighton Ruff, MD   Chief Complaint: Atrial fibrillation, prewatchman visit  History of Present Illness:    [name] is a 79 y.o. female who presents for a prewatchman evaluation.  She was originally referred by Dr. Marlou Porch given her history of atrial fibrillation and recurrent episodes of GI bleeding treated with argon laser.  Given her elevated CHA2DS2-VASc score, she is thought to be at an elevated stroke risk but unable to take long-term anticoagulation given the recurrent GI bleeds.  Today she tells me she is doing well.  No noticeable bleeding episodes since her last visit.  She continues to take her Eliquis without any missed doses.     Past Medical History:  Diagnosis Date  . Acute GI bleeding 05/13/2015  . Anxiety   . Arthritis   . Asthma   . Atrial fibrillation ([place]) 10/29/2017  . Bakers cyst, left   . Chronic kidney disease    stage 3  . Constipation   . Diabetes mellitus    type 2  . Dysrhythmia   . GERD (gastroesophageal reflux disease)   . Heart murmur   . History of cellulitis   . History of dizziness   . History of hiatal hernia    tiny 06/15/2016  . Hypertension   . Iron deficiency anemia   . Peripheral vascular disease ([place])   . PONV (postoperative nausea and vomiting)   . Pulmonary nodule     Past Surgical History:  Procedure Laterality Date  . APPENDECTOMY  1952  . BIOPSY  10/16/2018   Procedure: BIOPSY;  Surgeon: Clarene Essex, MD;  [facility];  Service: Endoscopy;;  . BIOPSY  11/26/2019   Procedure: BIOPSY;  Surgeon: Clarene Essex, MD;  [facility];  Service: Endoscopy;;  . COLONOSCOPY W/ POLYPECTOMY  05/15/2013  . ESOPHAGOGASTRODUODENOSCOPY (EGD) WITH  PROPOFOL N/A 06/15/2016   Procedure: ESOPHAGOGASTRODUODENOSCOPY (EGD) WITH PROPOFOL;  Surgeon: Clarene Essex, MD;  [facility];  Service: Endoscopy;  Laterality: N/A;  . ESOPHAGOGASTRODUODENOSCOPY (EGD) WITH PROPOFOL N/A 10/16/2018   Procedure: ESOPHAGOGASTRODUODENOSCOPY (EGD) WITH PROPOFOL;  Surgeon: Clarene Essex, MD;  [facility];  Service: Endoscopy;  Laterality: N/A;  . ESOPHAGOGASTRODUODENOSCOPY (EGD) WITH PROPOFOL N/A 11/26/2019   Procedure: ESOPHAGOGASTRODUODENOSCOPY (EGD) WITH PROPOFOL;  Surgeon: Clarene Essex, MD;  [facility];  Service: Endoscopy;  Laterality: N/A;  . EYE SURGERY Bilateral    cataracts removed  . GI RADIOFREQUENCY ABLATION  10/16/2018   Procedure: GI RADIOFREQUENCY ABLATION;  Surgeon: Clarene Essex, MD;  [facility];  Service: Endoscopy;;  . GI RADIOFREQUENCY ABLATION N/A 11/26/2019   Procedure: GI RADIOFREQUENCY ABLATION;  Surgeon: Clarene Essex, MD;  [facility];  Service: Endoscopy;  Laterality: N/A;  . HAND SURGERY Left 2010   operated on index and ring finger  . INCONTINENCE SURGERY    . JOINT REPLACEMENT Right    knee  . KNEE ARTHROSCOPY  2003 2005   right knee  . KYPHOPLASTY N/A 01/29/2020   Procedure: T8 KYPHOPLASTY;  Surgeon: Melina Schools, MD;  Location: [place];  Service: Orthopedics;  Laterality: N/A;  60 mins Local with [facility]  . mass fallopian tube    . SPINAL CORD STIMULATOR BATTERY EXCHANGE N/A 09/09/2015  Procedure: SPINAL CORD STIMULATOR BATTERY EXCHANGE;  Surgeon: Melina Schools, MD;  Location: [place];  Service: Orthopedics;  Laterality: N/A;  . TOTAL KNEE ARTHROPLASTY Left 11/05/2017   Procedure: LEFT TOTAL KNEE ARTHROPLASTY;  Surgeon: Paralee Cancel, MD;  [facility];  Service: Orthopedics;  Laterality: Left;  Adductor Block  . VAGINAL HYSTERECTOMY  1977   1 ovary removed    Current Medications: Current Meds  Medication Sig  . acetaminophen (TYLENOL) 500 MG tablet Take 1,000 mg by mouth every  8 (eight) hours as needed for moderate pain.  Marland Kitchen albuterol (PROAIR HFA) 108 (90 BASE) MCG/ACT inhaler Inhale 2 puffs into the lungs every 4 (four) hours as needed for wheezing or shortness of breath.  . ALPRAZolam (XANAX) 0.25 MG tablet Take 1 tablet by mouth at bedtime.   Marland Kitchen amLODipine (NORVASC) 10 MG tablet Take 10 mg by mouth daily.  . diphenhydrAMINE (BENADRYL) 25 MG tablet Take 25 mg by mouth daily as needed for allergies.  Marland Kitchen docusate sodium (COLACE) 100 MG capsule Take 100 mg by mouth 2 (two) times daily.  . DROPLET PEN NEEDLES 32G X 4 MM MISC   . ELIQUIS 5 MG TABS tablet TAKE 1 TABLET TWICE DAILY (Patient taking differently: Take 5 mg by mouth 2 (two) times daily. )  . esomeprazole (NEXIUM) 40 MG capsule Take 40 mg by mouth in the morning and at bedtime. Morning & afternoon.  . fluticasone (FLONASE) 50 MCG/ACT nasal spray Place into both nostrils daily.  Marland Kitchen gabapentin (NEURONTIN) 300 MG capsule Take 600 mg by mouth in the morning, at noon, and at bedtime.   . Insulin Glargine (LANTUS SOLOSTAR) 100 UNIT/ML Solostar Pen Inject 30 Units into the skin at bedtime.   . Iron-FA-B Cmp-C-Biot-Probiotic (FUSION PLUS) CAPS Take 1 capsule by mouth every evening.   Marland Kitchen losartan (COZAAR) 100 MG tablet Take 100 mg by mouth daily.  . metoprolol (LOPRESSOR) 50 MG tablet Take 75-100 mg by mouth See admin instructions. Take 2 tablets (100 mg) by mouth in the morning & take 1.5 tablets (75 mg) by mouth in the evening.  . Multiple Vitamins-Minerals (PRESERVISION AREDS 2 PO) Take 1 capsule by mouth 2 (two) times daily.   Marland Kitchen NOVOLOG FLEXPEN 100 UNIT/ML FlexPen Inject 16 Units into the skin in the morning, at noon, and at bedtime. Sliding scale insulin  . ondansetron (ZOFRAN) 4 MG tablet Take 4 mg by mouth every 8 (eight) hours as needed for nausea or vomiting.  . Pitavastatin Calcium (LIVALO) 1 MG TABS Take 1 mg by mouth at bedtime.  . triamterene-hydrochlorothiazide (MAXZIDE-25) 37.5-25 MG tablet Take 2 capsules by  mouth daily.   Marland Kitchen venlafaxine (EFFEXOR) 37.5 MG tablet Take 37.5 mg by mouth 2 (two) times daily.     Allergies:   Vasotec, Atorvastatin, Codeine, Cymbalta [duloxetine hcl], Lansoprazole, Morphine and related, Sulfate, Tramadol, Adhesive [tape], and Scopolamine   Social History   Socioeconomic History  . Marital status: Married    Spouse name: Charlotte Crumb  . Number of children: 2  . Years of education: 50  . Highest education level: Not on file  Occupational History    Comment: retired Pharmacist, hospital, IT sales professional  Tobacco Use  . Smoking status: Never Smoker  . Smokeless tobacco: Never Used  Vaping Use  . Vaping Use: Never used  Substance and Sexual Activity  . Alcohol use: No  . Drug use: No  . Sexual activity: Never    Birth control/protection: Surgical  Other Topics Concern  . Not  on file  Social History Narrative   Lives with husband   Caffeine use- none   Social Determinants of Health   Financial Resource Strain:   . Difficulty of Paying Living Expenses: Not on file  Food Insecurity:   . Worried About Charity fundraiser in the Last Year: Not on file  . Ran Out of Food in the Last Year: Not on file  Transportation Needs:   . Lack of Transportation (Medical): Not on file  . Lack of Transportation (Non-Medical): Not on file  Physical Activity:   . Days of Exercise per Week: Not on file  . Minutes of Exercise per Session: Not on file  Stress:   . Feeling of Stress : Not on file  Social Connections:   . Frequency of Communication with Friends and Family: Not on file  . Frequency of Social Gatherings with Friends and Family: Not on file  . Attends Religious Services: Not on file  . Active Member of Clubs or Organizations: Not on file  . Attends Archivist Meetings: Not on file  . Marital Status: Not on file     Family History: The patient's family history includes Heart attack in her father; Lung cancer in her mother. There is no history of Colon cancer,  Stroke, or Migraines.  ROS:   Please see the history of present illness.    All other systems reviewed and are negative.  EKGs/Labs/Other Studies Reviewed:    The following studies were reviewed today: CT  EKG today shows normal sinus rhythm  April 23, 2020 CT cardiac personally reviewed Large left atrial appendage with a chicken wing/broccoli morphology.  No left atrial appendage thrombus. [place] does not need an EKG   Recent Labs: 01/27/2020: Hemoglobin 12.6; Platelets 249 04/23/2020: BUN 21; Creatinine, Ser 1.32; Potassium 4.4; Sodium 132  Recent Lipid Panel No results found for: CHOL, TRIG, HDL, CHOLHDL, VLDL, LDLCALC, LDLDIRECT  Physical Exam:    VS:  BP 130/60   Pulse 68   Ht 5\' 5"  (1.651 m)   Wt 153 lb 3.2 oz (69.5 kg)   SpO2 99%   BMI 25.49 kg/m     Wt Readings from Last 3 Encounters:  06/29/20 153 lb 3.2 oz (69.5 kg)  05/25/20 155 lb (70.3 kg)  04/14/20 155 lb (70.3 kg)     GEN:  Well nourished, well developed in no acute distress HEENT: Normal NECK: No JVD; No carotid bruits LYMPHATICS: No lymphadenopathy CARDIAC: RRR, no murmurs, rubs, gallops RESPIRATORY:  Clear to auscultation without rales, wheezing or rhonchi  ABDOMEN: Soft, non-tender, non-distended MUSCULOSKELETAL:  No edema; No deformity  SKIN: Warm and dry NEUROLOGIC:  Alert and oriented x 3 PSYCHIATRIC:  Normal affect   ASSESSMENT:    1. Paroxysmal atrial fibrillation (HCC)   2. History of GI bleed    PLAN:    In order of problems listed above:  1. Paroxysmal atrial fibrillation Patient with an elevated chads vascular score of 5 which is equal to a 7.2% stroke rate per year.  She is a poor long-term anticoagulation candidate given her history of recurrent GI bleeding requiring argon laser therapy.  I do think that she is a reasonable short-term anticoagulation candidate.   Left atrial appendage has a mixed broccoli/chicken wing morphology with a very anterior directed dominant lobe  which will likely necessitate a posterior transseptal crossing.  Landing zone looks amenable to device closure.  She is scheduled for watchman implant on July 01, 2020.  She will continue her anticoagulation in the periprocedural.  Except for holding the medication the morning of her procedure.  She will take a aspirin 81 mg the morning of the procedure.  She will also take her metoprolol.  The risks and benefits and expected recovery of the procedure were reviewed again in detail during today's visit.  The risks include 1% risk of stroke, less than 1% risk of perforation or device embolization.   Medication Adjustments/Labs and Tests Ordered: Current medicines are reviewed at length with the patient today.  Concerns regarding medicines are outlined above.  Orders Placed This Encounter  Procedures  . Basic Metabolic Panel (BMET)  . CBC w/Diff   No orders of the defined types were placed in this encounter.    Signed, Lars Mage, MD, [facility]  06/29/2020 10:50 AM    Electrophysiology [facility]

## 2020-06-29 NOTE — Progress Notes (Signed)
Electrophysiology Office Note:    Date:  06/29/2020   ID:  [name], DOB Nov 25, 1940, MRN [id number]  PCP:  Leighton Ruff, MD  [facility] Cardiologist:  Candee Furbish, MD  [facility] Electrophysiologist:  Vickie Epley, MD   Referring MD: Leighton Ruff, MD   Chief Complaint: Atrial fibrillation, prewatchman visit  History of Present Illness:    [name] is a 79 y.o. female who presents for a prewatchman evaluation.  She was originally referred by Dr. Marlou Porch given her history of atrial fibrillation and recurrent episodes of GI bleeding treated with argon laser.  Given her elevated CHA2DS2-VASc score, she is thought to be at an elevated stroke risk but unable to take long-term anticoagulation given the recurrent GI bleeds.  Today she tells me she is doing well.  No noticeable bleeding episodes since her last visit.  She continues to take her Eliquis without any missed doses.     Past Medical History:  Diagnosis Date  . Acute GI bleeding 05/13/2015  . Anxiety   . Arthritis   . Asthma   . Atrial fibrillation ([place]) 10/29/2017  . Bakers cyst, left   . Chronic kidney disease    stage 3  . Constipation   . Diabetes mellitus    type 2  . Dysrhythmia   . GERD (gastroesophageal reflux disease)   . Heart murmur   . History of cellulitis   . History of dizziness   . History of hiatal hernia    tiny 06/15/2016  . Hypertension   . Iron deficiency anemia   . Peripheral vascular disease ([place])   . PONV (postoperative nausea and vomiting)   . Pulmonary nodule     Past Surgical History:  Procedure Laterality Date  . APPENDECTOMY  1952  . BIOPSY  10/16/2018   Procedure: BIOPSY;  Surgeon: Clarene Essex, MD;  Location: [facility];  Service: Endoscopy;;  . BIOPSY  11/26/2019   Procedure: BIOPSY;  Surgeon: Clarene Essex, MD;  Location: [facility];  Service: Endoscopy;;  . COLONOSCOPY W/ POLYPECTOMY  05/15/2013  . ESOPHAGOGASTRODUODENOSCOPY (EGD) WITH  PROPOFOL N/A 06/15/2016   Procedure: ESOPHAGOGASTRODUODENOSCOPY (EGD) WITH PROPOFOL;  Surgeon: Clarene Essex, MD;  Location: [facility];  Service: Endoscopy;  Laterality: N/A;  . ESOPHAGOGASTRODUODENOSCOPY (EGD) WITH PROPOFOL N/A 10/16/2018   Procedure: ESOPHAGOGASTRODUODENOSCOPY (EGD) WITH PROPOFOL;  Surgeon: Clarene Essex, MD;  Location: [facility];  Service: Endoscopy;  Laterality: N/A;  . ESOPHAGOGASTRODUODENOSCOPY (EGD) WITH PROPOFOL N/A 11/26/2019   Procedure: ESOPHAGOGASTRODUODENOSCOPY (EGD) WITH PROPOFOL;  Surgeon: Clarene Essex, MD;  Location: [facility];  Service: Endoscopy;  Laterality: N/A;  . EYE SURGERY Bilateral    cataracts removed  . GI RADIOFREQUENCY ABLATION  10/16/2018   Procedure: GI RADIOFREQUENCY ABLATION;  Surgeon: Clarene Essex, MD;  Location: [facility];  Service: Endoscopy;;  . GI RADIOFREQUENCY ABLATION N/A 11/26/2019   Procedure: GI RADIOFREQUENCY ABLATION;  Surgeon: Clarene Essex, MD;  Location: [facility];  Service: Endoscopy;  Laterality: N/A;  . HAND SURGERY Left 2010   operated on index and ring finger  . INCONTINENCE SURGERY    . JOINT REPLACEMENT Right    knee  . KNEE ARTHROSCOPY  2003 2005   right knee  . KYPHOPLASTY N/A 01/29/2020   Procedure: T8 KYPHOPLASTY;  Surgeon: Melina Schools, MD;  Location: [facility];  Service: Orthopedics;  Laterality: N/A;  60 mins Local with [facility]  . mass fallopian tube    . SPINAL CORD STIMULATOR BATTERY EXCHANGE N/A 09/09/2015  Procedure: SPINAL CORD STIMULATOR BATTERY EXCHANGE;  Surgeon: Melina Schools, MD;  Location: [facility];  Service: Orthopedics;  Laterality: N/A;  . TOTAL KNEE ARTHROPLASTY Left 11/05/2017   Procedure: LEFT TOTAL KNEE ARTHROPLASTY;  Surgeon: Paralee Cancel, MD;  Location: [facility];  Service: Orthopedics;  Laterality: Left;  Adductor Block  . VAGINAL HYSTERECTOMY  1977   1 ovary removed    Current Medications: Current Meds  Medication Sig  . acetaminophen (TYLENOL) 500 MG tablet Take 1,000 mg by mouth every  8 (eight) hours as needed for moderate pain.  Marland Kitchen albuterol (PROAIR HFA) 108 (90 BASE) MCG/ACT inhaler Inhale 2 puffs into the lungs every 4 (four) hours as needed for wheezing or shortness of breath.  . ALPRAZolam (XANAX) 0.25 MG tablet Take 1 tablet by mouth at bedtime.   Marland Kitchen amLODipine (NORVASC) 10 MG tablet Take 10 mg by mouth daily.  . diphenhydrAMINE (BENADRYL) 25 MG tablet Take 25 mg by mouth daily as needed for allergies.  Marland Kitchen docusate sodium (COLACE) 100 MG capsule Take 100 mg by mouth 2 (two) times daily.  . DROPLET PEN NEEDLES 32G X 4 MM MISC   . ELIQUIS 5 MG TABS tablet TAKE 1 TABLET TWICE DAILY (Patient taking differently: Take 5 mg by mouth 2 (two) times daily. )  . esomeprazole (NEXIUM) 40 MG capsule Take 40 mg by mouth in the morning and at bedtime. Morning & afternoon.  . fluticasone (FLONASE) 50 MCG/ACT nasal spray Place into both nostrils daily.  Marland Kitchen gabapentin (NEURONTIN) 300 MG capsule Take 600 mg by mouth in the morning, at noon, and at bedtime.   . Insulin Glargine (LANTUS SOLOSTAR) 100 UNIT/ML Solostar Pen Inject 30 Units into the skin at bedtime.   . Iron-FA-B Cmp-C-Biot-Probiotic (FUSION PLUS) CAPS Take 1 capsule by mouth every evening.   Marland Kitchen losartan (COZAAR) 100 MG tablet Take 100 mg by mouth daily.  . metoprolol (LOPRESSOR) 50 MG tablet Take 75-100 mg by mouth See admin instructions. Take 2 tablets (100 mg) by mouth in the morning & take 1.5 tablets (75 mg) by mouth in the evening.  . Multiple Vitamins-Minerals (PRESERVISION AREDS 2 PO) Take 1 capsule by mouth 2 (two) times daily.   Marland Kitchen NOVOLOG FLEXPEN 100 UNIT/ML FlexPen Inject 16 Units into the skin in the morning, at noon, and at bedtime. Sliding scale insulin  . ondansetron (ZOFRAN) 4 MG tablet Take 4 mg by mouth every 8 (eight) hours as needed for nausea or vomiting.  . Pitavastatin Calcium (LIVALO) 1 MG TABS Take 1 mg by mouth at bedtime.  . triamterene-hydrochlorothiazide (MAXZIDE-25) 37.5-25 MG tablet Take 2 capsules by  mouth daily.   Marland Kitchen venlafaxine (EFFEXOR) 37.5 MG tablet Take 37.5 mg by mouth 2 (two) times daily.     Allergies:   Vasotec, Atorvastatin, Codeine, Cymbalta [duloxetine hcl], Lansoprazole, Morphine and related, Sulfate, Tramadol, Adhesive [tape], and Scopolamine   Social History   Socioeconomic History  . Marital status: Married    Spouse name: Charlotte Crumb  . Number of children: 2  . Years of education: 40  . Highest education level: Not on file  Occupational History    Comment: retired Pharmacist, hospital, IT sales professional  Tobacco Use  . Smoking status: Never Smoker  . Smokeless tobacco: Never Used  Vaping Use  . Vaping Use: Never used  Substance and Sexual Activity  . Alcohol use: No  . Drug use: No  . Sexual activity: Never    Birth control/protection: Surgical  Other Topics Concern  . Not  on file  Social History Narrative   Lives with husband   Caffeine use- none   Social Determinants of Health   Financial Resource Strain:   . Difficulty of Paying Living Expenses: Not on file  Food Insecurity:   . Worried About Charity fundraiser in the Last Year: Not on file  . Ran Out of Food in the Last Year: Not on file  Transportation Needs:   . Lack of Transportation (Medical): Not on file  . Lack of Transportation (Non-Medical): Not on file  Physical Activity:   . Days of Exercise per Week: Not on file  . Minutes of Exercise per Session: Not on file  Stress:   . Feeling of Stress : Not on file  Social Connections:   . Frequency of Communication with Friends and Family: Not on file  . Frequency of Social Gatherings with Friends and Family: Not on file  . Attends Religious Services: Not on file  . Active Member of Clubs or Organizations: Not on file  . Attends Archivist Meetings: Not on file  . Marital Status: Not on file     Family History: The patient's family history includes Heart attack in her father; Lung cancer in her mother. There is no history of Colon cancer,  Stroke, or Migraines.  ROS:   Please see the history of present illness.    All other systems reviewed and are negative.  EKGs/Labs/Other Studies Reviewed:    The following studies were reviewed today: CT  EKG today shows normal sinus rhythm  April 23, 2020 CT cardiac personally reviewed Large left atrial appendage with a chicken wing/broccoli morphology.  No left atrial appendage thrombus. Badertscher does not need an EKG   Recent Labs: 01/27/2020: Hemoglobin 12.6; Platelets 249 04/23/2020: BUN 21; Creatinine, Ser 1.32; Potassium 4.4; Sodium 132  Recent Lipid Panel No results found for: CHOL, TRIG, HDL, CHOLHDL, VLDL, LDLCALC, LDLDIRECT  Physical Exam:    VS:  BP 130/60   Pulse 68   Ht 5\' 5"  (1.651 m)   Wt 153 lb 3.2 oz (69.5 kg)   SpO2 99%   BMI 25.49 kg/m     Wt Readings from Last 3 Encounters:  06/29/20 153 lb 3.2 oz (69.5 kg)  05/25/20 155 lb (70.3 kg)  04/14/20 155 lb (70.3 kg)     GEN:  Well nourished, well developed in no acute distress HEENT: Normal NECK: No JVD; No carotid bruits LYMPHATICS: No lymphadenopathy CARDIAC: RRR, no murmurs, rubs, gallops RESPIRATORY:  Clear to auscultation without rales, wheezing or rhonchi  ABDOMEN: Soft, non-tender, non-distended MUSCULOSKELETAL:  No edema; No deformity  SKIN: Warm and dry NEUROLOGIC:  Alert and oriented x 3 PSYCHIATRIC:  Normal affect   ASSESSMENT:    1. Paroxysmal atrial fibrillation (HCC)   2. History of GI bleed    PLAN:    In order of problems listed above:  1. Paroxysmal atrial fibrillation Patient with an elevated chads vascular score of 5 which is equal to a 7.2% stroke rate per year.  She is a poor long-term anticoagulation candidate given her history of recurrent GI bleeding requiring argon laser therapy.  I do think that she is a reasonable short-term anticoagulation candidate.   Left atrial appendage has a mixed broccoli/chicken wing morphology with a very anterior directed dominant lobe  which will likely necessitate a posterior transseptal crossing.  Landing zone looks amenable to device closure.  She is scheduled for watchman implant on July 01, 2020.  She will continue her anticoagulation in the periprocedural.  Except for holding the medication the morning of her procedure.  She will take a aspirin 81 mg the morning of the procedure.  She will also take her metoprolol.  The risks and benefits and expected recovery of the procedure were reviewed again in detail during today's visit.  The risks include 1% risk of stroke, less than 1% risk of perforation or device embolization.   Medication Adjustments/Labs and Tests Ordered: Current medicines are reviewed at length with the patient today.  Concerns regarding medicines are outlined above.  Orders Placed This Encounter  Procedures  . Basic Metabolic Panel (BMET)  . CBC w/Diff   No orders of the defined types were placed in this encounter.    Signed, Lars Mage, MD, [facility]  06/29/2020 10:50 AM    Electrophysiology [facility]

## 2020-06-29 NOTE — Patient Instructions (Signed)
Medication Instructions:  Your physician recommends that you continue on your current medications as directed. Please refer to the Current Medication list given to you today.  Labwork: You will get lab work today:  BMP and CBC  Testing/Procedures: None ordered.  Follow-Up: Post Watchman implant  Any Other Special Instructions Will Be Listed Below (If Applicable).  If you need a refill on your cardiac medications before your next appointment, please call your pharmacy.

## 2020-06-30 NOTE — Progress Notes (Signed)
Patient called with pre-op instructions. Verbalized understanding. Stated Willeen Cass, RN from office had mailed a letter and reviewed it in great detail instructions. Reminded patient to be npo p midnight and to arrive at 0530am

## 2020-06-30 NOTE — Anesthesia Preprocedure Evaluation (Addendum)
Anesthesia Evaluation  Patient identified by MRN, date of birth, ID band Patient awake    Reviewed: Allergy & Precautions, H&P , NPO status , Patient's Chart, lab work & pertinent test results  History of Anesthesia Complications (+) PONV  Airway Mallampati: II  TM Distance: >3 FB Neck ROM: Full    Dental no notable dental hx. (+) Teeth Intact, Dental Advisory Given   Pulmonary neg pulmonary ROS,    Pulmonary exam normal breath sounds clear to auscultation       Cardiovascular Exercise Tolerance: Good hypertension, Pt. on medications and Pt. on home beta blockers + Peripheral Vascular Disease  + dysrhythmias Atrial Fibrillation  Rhythm:Irregular Rate:Normal     Neuro/Psych Anxiety Depression negative neurological ROS     GI/Hepatic Neg liver ROS, hiatal hernia, GERD  Medicated,  Endo/Other  diabetes, Insulin Dependent  Renal/GU Renal InsufficiencyRenal disease  negative genitourinary   Musculoskeletal  (+) Arthritis , Osteoarthritis,    Abdominal   Peds  Hematology negative hematology ROS (+)   Anesthesia Other Findings   Reproductive/Obstetrics negative OB ROS                            Anesthesia Physical Anesthesia Plan  ASA: III  Anesthesia Plan: General   Post-op Pain Management:    Induction: Intravenous  PONV Risk Score and Plan: 4 or greater and Ondansetron, Diphenhydramine and Dexamethasone  Airway Management Planned: Oral ETT  Additional Equipment: Arterial line  Intra-op Plan:   Post-operative Plan: Extubation in OR  Informed Consent: I have reviewed the patients History and Physical, chart, labs and discussed the procedure including the risks, benefits and alternatives for the proposed anesthesia with the patient or authorized representative who has indicated his/her understanding and acceptance.     Dental advisory given  Plan Discussed with:  CRNA  Anesthesia Plan Comments:        Anesthesia Quick Evaluation

## 2020-07-01 ENCOUNTER — Inpatient Hospital Stay (HOSPITAL_COMMUNITY): Payer: Medicare PPO | Admitting: Certified Registered"

## 2020-07-01 ENCOUNTER — Inpatient Hospital Stay (HOSPITAL_COMMUNITY)
Admission: RE | Admit: 2020-07-01 | Discharge: 2020-07-01 | Disposition: A | Discharge status: Home / Self Care | Payer: Medicare PPO | Source: Home / Self Care | Attending: Cardiology | Admitting: Cardiology

## 2020-07-01 ENCOUNTER — Inpatient Hospital Stay (HOSPITAL_COMMUNITY)
Admission: RE | Admit: 2020-07-01 | Discharge: 2020-07-02 | DRG: 274 | Disposition: A | Discharge status: Home / Self Care | Payer: Medicare PPO | Attending: Cardiology | Admitting: Cardiology

## 2020-07-01 ENCOUNTER — Telehealth: Payer: Self-pay | Admitting: Nurse Practitioner

## 2020-07-01 ENCOUNTER — Other Ambulatory Visit: Payer: Self-pay

## 2020-07-01 ENCOUNTER — Inpatient Hospital Stay (HOSPITAL_COMMUNITY)
Admission: RE | Disposition: A | Discharge status: Home / Self Care | Payer: Medicare PPO | Source: Home / Self Care | Attending: Cardiology

## 2020-07-01 ENCOUNTER — Encounter (HOSPITAL_COMMUNITY): Payer: Self-pay | Admitting: Cardiology

## 2020-07-01 DIAGNOSIS — Z006 Encounter for examination for normal comparison and control in clinical research program: Secondary | ICD-10-CM

## 2020-07-01 DIAGNOSIS — Z794 Long term (current) use of insulin: Secondary | ICD-10-CM | POA: Diagnosis not present

## 2020-07-01 DIAGNOSIS — D509 Iron deficiency anemia, unspecified: Secondary | ICD-10-CM | POA: Diagnosis present

## 2020-07-01 DIAGNOSIS — Z801 Family history of malignant neoplasm of trachea, bronchus and lung: Secondary | ICD-10-CM | POA: Diagnosis not present

## 2020-07-01 DIAGNOSIS — Z8249 Family history of ischemic heart disease and other diseases of the circulatory system: Secondary | ICD-10-CM | POA: Diagnosis not present

## 2020-07-01 DIAGNOSIS — I313 Pericardial effusion (noninflammatory): Secondary | ICD-10-CM | POA: Diagnosis present

## 2020-07-01 DIAGNOSIS — J45909 Unspecified asthma, uncomplicated: Secondary | ICD-10-CM | POA: Diagnosis present

## 2020-07-01 DIAGNOSIS — Z888 Allergy status to other drugs, medicaments and biological substances status: Secondary | ICD-10-CM | POA: Diagnosis not present

## 2020-07-01 DIAGNOSIS — Z20822 Contact with and (suspected) exposure to covid-19: Secondary | ICD-10-CM | POA: Diagnosis present

## 2020-07-01 DIAGNOSIS — Z885 Allergy status to narcotic agent status: Secondary | ICD-10-CM

## 2020-07-01 DIAGNOSIS — K219 Gastro-esophageal reflux disease without esophagitis: Secondary | ICD-10-CM | POA: Diagnosis present

## 2020-07-01 DIAGNOSIS — I4821 Permanent atrial fibrillation: Secondary | ICD-10-CM | POA: Diagnosis present

## 2020-07-01 DIAGNOSIS — I480 Paroxysmal atrial fibrillation: Secondary | ICD-10-CM

## 2020-07-01 DIAGNOSIS — F418 Other specified anxiety disorders: Secondary | ICD-10-CM | POA: Diagnosis not present

## 2020-07-01 DIAGNOSIS — Z882 Allergy status to sulfonamides status: Secondary | ICD-10-CM

## 2020-07-01 DIAGNOSIS — I10 Essential (primary) hypertension: Secondary | ICD-10-CM | POA: Diagnosis not present

## 2020-07-01 DIAGNOSIS — M1990 Unspecified osteoarthritis, unspecified site: Secondary | ICD-10-CM | POA: Diagnosis present

## 2020-07-01 DIAGNOSIS — I4819 Other persistent atrial fibrillation: Secondary | ICD-10-CM | POA: Diagnosis present

## 2020-07-01 DIAGNOSIS — Z7901 Long term (current) use of anticoagulants: Secondary | ICD-10-CM

## 2020-07-01 DIAGNOSIS — K922 Gastrointestinal hemorrhage, unspecified: Secondary | ICD-10-CM | POA: Diagnosis present

## 2020-07-01 DIAGNOSIS — Z91048 Other nonmedicinal substance allergy status: Secondary | ICD-10-CM | POA: Diagnosis not present

## 2020-07-01 DIAGNOSIS — E7800 Pure hypercholesterolemia, unspecified: Secondary | ICD-10-CM | POA: Diagnosis not present

## 2020-07-01 DIAGNOSIS — Z79899 Other long term (current) drug therapy: Secondary | ICD-10-CM

## 2020-07-01 DIAGNOSIS — F419 Anxiety disorder, unspecified: Secondary | ICD-10-CM | POA: Diagnosis present

## 2020-07-01 DIAGNOSIS — E1151 Type 2 diabetes mellitus with diabetic peripheral angiopathy without gangrene: Secondary | ICD-10-CM | POA: Diagnosis present

## 2020-07-01 HISTORY — PX: LEFT ATRIAL APPENDAGE OCCLUSION: EP1229

## 2020-07-01 LAB — TYPE AND SCREEN
ABO/RH(D): A POS
Antibody Screen: NEGATIVE

## 2020-07-01 LAB — GLUCOSE, CAPILLARY
Glucose-Capillary: 129 mg/dL — ABNORMAL HIGH (ref 70–99)
Glucose-Capillary: 166 mg/dL — ABNORMAL HIGH (ref 70–99)
Glucose-Capillary: 191 mg/dL — ABNORMAL HIGH (ref 70–99)
Glucose-Capillary: 323 mg/dL — ABNORMAL HIGH (ref 70–99)
Glucose-Capillary: 238 mg/dL — ABNORMAL HIGH (ref 70–99)

## 2020-07-01 LAB — HEMOGLOBIN A1C
Hgb A1c MFr Bld: 5.7 % — ABNORMAL HIGH (ref 4.8–5.6)
Mean Plasma Glucose: 116.89 mg/dL

## 2020-07-01 LAB — SURGICAL PCR SCREEN
MRSA, PCR: NEGATIVE
Staphylococcus aureus: NEGATIVE

## 2020-07-01 LAB — POCT ACTIVATED CLOTTING TIME: Activated Clotting Time: 301 seconds

## 2020-07-01 SURGERY — LEFT ATRIAL APPENDAGE OCCLUSION
Anesthesia: General

## 2020-07-01 MED ORDER — HEPARIN (PORCINE) IN NACL 2000-0.9 UNIT/L-% IV SOLN
INTRAVENOUS | Status: DC | PRN
  Administered 2020-07-01: 1000 mL

## 2020-07-01 MED ORDER — VENLAFAXINE HCL 37.5 MG PO TABS
37.5000 mg | ORAL_TABLET | Freq: Two times a day (BID) | ORAL | Status: DC
  Administered 2020-07-01 – 2020-07-02 (×3): 37.5 mg via ORAL
  Filled 2020-07-01 (×4): qty 1

## 2020-07-01 MED ORDER — PROTAMINE SULFATE 10 MG/ML IV SOLN
INTRAVENOUS | Status: DC | PRN
  Administered 2020-07-01: 50 mg via INTRAVENOUS

## 2020-07-01 MED ORDER — CEFAZOLIN SODIUM-DEXTROSE 2-4 GM/100ML-% IV SOLN
2.0000 g | INTRAVENOUS | Status: AC
  Administered 2020-07-01: 2 g via INTRAVENOUS
  Filled 2020-07-01: qty 100

## 2020-07-01 MED ORDER — ALPRAZOLAM 0.25 MG PO TABS
0.2500 mg | ORAL_TABLET | Freq: Every day | ORAL | Status: DC
  Administered 2020-07-01: 0.25 mg via ORAL
  Filled 2020-07-01: qty 1

## 2020-07-01 MED ORDER — PHENYLEPHRINE 40 MCG/ML (10ML) SYRINGE FOR IV PUSH (FOR BLOOD PRESSURE SUPPORT)
PREFILLED_SYRINGE | INTRAVENOUS | Status: DC | PRN
  Administered 2020-07-01 (×3): 80 ug via INTRAVENOUS

## 2020-07-01 MED ORDER — SUGAMMADEX SODIUM 200 MG/2ML IV SOLN
INTRAVENOUS | Status: DC | PRN
  Administered 2020-07-01: 100 mg via INTRAVENOUS

## 2020-07-01 MED ORDER — INSULIN ASPART 100 UNIT/ML ~~LOC~~ SOLN
0.0000 [IU] | Freq: Three times a day (TID) | SUBCUTANEOUS | Status: DC
  Administered 2020-07-01: 3 [IU] via SUBCUTANEOUS
  Administered 2020-07-01: 11 [IU] via SUBCUTANEOUS
  Administered 2020-07-02: 8 [IU] via SUBCUTANEOUS
  Administered 2020-07-02: 5 [IU] via SUBCUTANEOUS

## 2020-07-01 MED ORDER — SODIUM CHLORIDE 0.45 % IV SOLN: INTRAVENOUS | Status: DC

## 2020-07-01 MED ORDER — ACETAMINOPHEN 325 MG PO TABS
650.0000 mg | ORAL_TABLET | ORAL | Status: DC | PRN
  Administered 2020-07-01 – 2020-07-02 (×2): 650 mg via ORAL
  Filled 2020-07-01 (×3): qty 2

## 2020-07-01 MED ORDER — ROCURONIUM BROMIDE 10 MG/ML (PF) SYRINGE
PREFILLED_SYRINGE | INTRAVENOUS | Status: DC | PRN
  Administered 2020-07-01: 50 mg via INTRAVENOUS

## 2020-07-01 MED ORDER — ASPIRIN EC 81 MG PO TBEC
81.0000 mg | DELAYED_RELEASE_TABLET | Freq: Every day | ORAL | Status: DC
  Administered 2020-07-02: 81 mg via ORAL
  Filled 2020-07-01 (×2): qty 1

## 2020-07-01 MED ORDER — METOPROLOL TARTRATE 50 MG PO TABS
75.0000 mg | ORAL_TABLET | Freq: Every day | ORAL | Status: DC
  Administered 2020-07-01: 75 mg via ORAL
  Filled 2020-07-01: qty 1

## 2020-07-01 MED ORDER — LACTATED RINGERS IV SOLN: INTRAVENOUS | Status: DC

## 2020-07-01 MED ORDER — DEXAMETHASONE SODIUM PHOSPHATE 10 MG/ML IJ SOLN
INTRAMUSCULAR | Status: DC | PRN
  Administered 2020-07-01: 4 mg via INTRAVENOUS

## 2020-07-01 MED ORDER — AMLODIPINE BESYLATE 10 MG PO TABS
10.0000 mg | ORAL_TABLET | Freq: Every day | ORAL | Status: DC
  Administered 2020-07-01 – 2020-07-02 (×2): 10 mg via ORAL
  Filled 2020-07-01 (×2): qty 1

## 2020-07-01 MED ORDER — SODIUM CHLORIDE 0.9 % IV SOLN: 250.0000 mL | INTRAVENOUS | Status: DC | PRN

## 2020-07-01 MED ORDER — CHLORHEXIDINE GLUCONATE CLOTH 2 % EX PADS
6.0000 | MEDICATED_PAD | Freq: Every day | CUTANEOUS | Status: DC
  Administered 2020-07-02: 6 via TOPICAL

## 2020-07-01 MED ORDER — HEPARIN (PORCINE) IN NACL 2000-0.9 UNIT/L-% IV SOLN
INTRAVENOUS | Status: AC
  Filled 2020-07-01: qty 1000

## 2020-07-01 MED ORDER — ACETAMINOPHEN 500 MG PO TABS
1000.0000 mg | ORAL_TABLET | Freq: Once | ORAL | Status: AC
  Administered 2020-07-01: 1000 mg via ORAL
  Filled 2020-07-01: qty 2

## 2020-07-01 MED ORDER — CHLORHEXIDINE GLUCONATE 0.12 % MT SOLN
OROMUCOSAL | Status: AC
  Filled 2020-07-01: qty 15

## 2020-07-01 MED ORDER — HEPARIN SODIUM (PORCINE) 1000 UNIT/ML IJ SOLN
INTRAMUSCULAR | Status: DC | PRN
  Administered 2020-07-01: 11000 [IU] via INTRAVENOUS

## 2020-07-01 MED ORDER — WHITE PETROLATUM EX OINT
TOPICAL_OINTMENT | CUTANEOUS | Status: AC
  Administered 2020-07-01: 0.2
  Filled 2020-07-01: qty 28.35

## 2020-07-01 MED ORDER — MIDAZOLAM HCL 5 MG/5ML IJ SOLN
INTRAMUSCULAR | Status: DC | PRN
  Administered 2020-07-01 (×2): 1 mg via INTRAVENOUS

## 2020-07-01 MED ORDER — HEPARIN (PORCINE) IN NACL 1000-0.9 UT/500ML-% IV SOLN
INTRAVENOUS | Status: AC
  Filled 2020-07-01: qty 500

## 2020-07-01 MED ORDER — SODIUM CHLORIDE 0.9% FLUSH: 3.0000 mL | INTRAVENOUS | Status: DC | PRN

## 2020-07-01 MED ORDER — PHENYLEPHRINE HCL-NACL 10-0.9 MG/250ML-% IV SOLN
INTRAVENOUS | Status: DC | PRN
  Administered 2020-07-01: 25 ug/min via INTRAVENOUS

## 2020-07-01 MED ORDER — PANTOPRAZOLE SODIUM 40 MG PO TBEC
40.0000 mg | DELAYED_RELEASE_TABLET | Freq: Every day | ORAL | Status: DC
  Administered 2020-07-01 – 2020-07-02 (×2): 40 mg via ORAL
  Filled 2020-07-01 (×2): qty 1

## 2020-07-01 MED ORDER — LIDOCAINE 2% (20 MG/ML) 5 ML SYRINGE
INTRAMUSCULAR | Status: DC | PRN
  Administered 2020-07-01: 50 mg via INTRAVENOUS

## 2020-07-01 MED ORDER — METOPROLOL TARTRATE 50 MG PO TABS
100.0000 mg | ORAL_TABLET | Freq: Every day | ORAL | Status: DC
  Administered 2020-07-02: 100 mg via ORAL
  Filled 2020-07-01 (×2): qty 2

## 2020-07-01 MED ORDER — PRAVASTATIN SODIUM 40 MG PO TABS
20.0000 mg | ORAL_TABLET | Freq: Every day | ORAL | Status: DC
  Administered 2020-07-01: 20 mg via ORAL
  Filled 2020-07-01: qty 1

## 2020-07-01 MED ORDER — ONDANSETRON HCL 4 MG/2ML IJ SOLN
INTRAMUSCULAR | Status: DC | PRN
  Administered 2020-07-01: 4 mg via INTRAVENOUS

## 2020-07-01 MED ORDER — APIXABAN 5 MG PO TABS
5.0000 mg | ORAL_TABLET | Freq: Two times a day (BID) | ORAL | Status: DC
  Filled 2020-07-01: qty 1

## 2020-07-01 MED ORDER — METOPROLOL TARTRATE 50 MG PO TABS: 75.0000 mg | ORAL_TABLET | ORAL | Status: DC

## 2020-07-01 MED ORDER — SODIUM CHLORIDE 0.9 % IV SOLN: INTRAVENOUS | Status: DC

## 2020-07-01 MED ORDER — KETOROLAC TROMETHAMINE 30 MG/ML IJ SOLN
30.0000 mg | Freq: Once | INTRAMUSCULAR | Status: AC
  Administered 2020-07-01: 30 mg via INTRAVENOUS
  Filled 2020-07-01: qty 1

## 2020-07-01 MED ORDER — SODIUM CHLORIDE 0.9% FLUSH
3.0000 mL | Freq: Two times a day (BID) | INTRAVENOUS | Status: DC
  Administered 2020-07-01 – 2020-07-02 (×3): 3 mL via INTRAVENOUS

## 2020-07-01 MED ORDER — DIPHENHYDRAMINE HCL 50 MG/ML IJ SOLN
INTRAMUSCULAR | Status: DC | PRN
  Administered 2020-07-01: 25 mg via INTRAVENOUS

## 2020-07-01 MED ORDER — TRIAMTERENE-HCTZ 37.5-25 MG PO TABS
2.0000 | ORAL_TABLET | Freq: Every day | ORAL | Status: DC
  Administered 2020-07-01 – 2020-07-02 (×2): 2 via ORAL
  Filled 2020-07-01 (×2): qty 2

## 2020-07-01 MED ORDER — LOSARTAN POTASSIUM 50 MG PO TABS
100.0000 mg | ORAL_TABLET | Freq: Every day | ORAL | Status: DC
  Administered 2020-07-01 – 2020-07-02 (×2): 100 mg via ORAL
  Filled 2020-07-01 (×2): qty 2

## 2020-07-01 MED ORDER — PROPOFOL 10 MG/ML IV BOLUS
INTRAVENOUS | Status: DC | PRN
  Administered 2020-07-01: 10 mg via INTRAVENOUS
  Administered 2020-07-01: 140 mg via INTRAVENOUS

## 2020-07-01 MED ORDER — LIDOCAINE HCL (PF) 1 % IJ SOLN
INTRAMUSCULAR | Status: AC
  Filled 2020-07-01: qty 30

## 2020-07-01 MED ORDER — HEPARIN (PORCINE) IN NACL 1000-0.9 UT/500ML-% IV SOLN
INTRAVENOUS | Status: DC | PRN
  Administered 2020-07-01 (×2): 500 mL

## 2020-07-01 MED ORDER — IOHEXOL 350 MG/ML SOLN
INTRAVENOUS | Status: DC | PRN
  Administered 2020-07-01: 10 mL

## 2020-07-01 MED ORDER — FENTANYL CITRATE (PF) 100 MCG/2ML IJ SOLN
INTRAMUSCULAR | Status: DC | PRN
  Administered 2020-07-01 (×2): 50 ug via INTRAVENOUS

## 2020-07-01 MED ORDER — DOCUSATE SODIUM 100 MG PO CAPS
100.0000 mg | ORAL_CAPSULE | Freq: Two times a day (BID) | ORAL | Status: DC
  Administered 2020-07-01 – 2020-07-02 (×3): 100 mg via ORAL
  Filled 2020-07-01 (×3): qty 1

## 2020-07-01 MED ORDER — ONDANSETRON HCL 4 MG/2ML IJ SOLN: 4.0000 mg | Freq: Four times a day (QID) | INTRAMUSCULAR | Status: DC | PRN

## 2020-07-01 SURGICAL SUPPLY — 17 items
BLANKET WARM UNDERBOD FULL ACC (MISCELLANEOUS) ×3 IMPLANT
CATH DIAG 6FR PIGTAIL ANGLED (CATHETERS) ×3 IMPLANT
CATH SWAN GANZ VIP 7.5F (CATHETERS) ×3 IMPLANT
CLOSURE PERCLOSE PROSTYLE (VASCULAR PRODUCTS) ×6 IMPLANT
ELECT DEFIB PAD ADLT CADENCE (PAD) ×3 IMPLANT
KIT HEART LEFT (KITS) ×6 IMPLANT
KIT VERSACROSS FXD (WIRE) ×3 IMPLANT
PACK CARDIAC CATHETERIZATION (CUSTOM PROCEDURE TRAY) ×3 IMPLANT
SHEATH PERFORMER 16FR 30 (SHEATH) ×3 IMPLANT
SHEATH PINNACLE 8F 10CM (SHEATH) ×3 IMPLANT
SHEATH PROBE COVER 6X72 (BAG) ×3 IMPLANT
STOPCOCK MORSE 400PSI 3WAY (MISCELLANEOUS) ×3 IMPLANT
WATCHMAN FLX 27 (Prosthesis & Implant Heart) ×3 IMPLANT
WATCHMAN FLX PROCEDURE DEVICE (Prosthesis & Implant Heart) ×3 IMPLANT
WATCHMAN PROCED TRUSEAL ACCESS (SHEATH) ×3 IMPLANT
WATCHMAN TRUSEAL DOUBLE CURVE (SHEATH) ×3 IMPLANT
WIRE AMPLATZ SS-J .035X180CM (WIRE) ×3 IMPLANT

## 2020-07-01 NOTE — Telephone Encounter (Signed)
Patient scheduled for TEE with Dr. Johnsie Cancel on December 22 at 10:00am. The case number is [id number].  Ambrose Pancoast, RN Watchman Coordinator

## 2020-07-01 NOTE — Transfer of Care (Signed)
Immediate Anesthesia Transfer of Care Note  Patient: [name]  Procedure(s) Performed: LEFT ATRIAL APPENDAGE OCCLUSION (N/A )  Patient Location: PACU  Anesthesia Type:General  Level of Consciousness: drowsy and patient cooperative  Airway & Oxygen Therapy: Patient Spontanous Breathing and Patient connected to nasal cannula oxygen  Post-op Assessment: Report given to RN, Post -op Vital signs reviewed and stable and Patient moving all extremities  Post vital signs: Reviewed and stable  Last Vitals:  Vitals Value Taken Time  BP 156/63 07/01/20 1023  Temp    Pulse 74 07/01/20 1027  Resp 18 07/01/20 1027  SpO2 99 % 07/01/20 1027  Vitals shown include unvalidated device data.  Last Pain:  Vitals:   07/01/20 0627  TempSrc: Temporal         Complications: No complications documented.

## 2020-07-01 NOTE — Op Note (Signed)
Watchman LAAO Procedure  Surgeon: Lars Mage, MD Co-Surgeon: Sherren Mocha, MD Anesthesia: Suann Larry, MD Imager: Marry Guan, MD and Jenkins Rouge, MD  Procedure: 1) Transseptal Puncture 2) Watchman left atrial appendage occlusion 3) Perclose right femoral vein  Device Implant: 27 mm Watchman Flex Device, Lot # [id number]  Background and Indication: 79 yo woman with atrial fibrillation, prior GI bleeding, felt to be a poor candidate for long term anticoagulation. She is referred for the above procedure  Procedure description: US guidance is used for right femoral venous access. [place] images are stored digitally in the patient's chart. Double Preclose is performed at 10" and 2" positions using normal technique and a after progressively dilating the femoral vein over an Amplatz Superstiff wire, a 16 Fr sheath is inserted. Transseptal puncture is then performed after fully anticoagulating the patient with unfractionated heparin. A Versacross system is used with RF energy to cross the interatrial septum under fluoroscopic and TEE guidance. Please see Dr Mardene Speak detailed report for further description of transseptal puncture and Watchman Access Sheath insertion.  Once the 14 Fr access sheath is in appropriate position in the left atrial appendage, a 27 mm Watchman Flex device is inserted and deployed a total of 3 times to obtain appropriate position and compression in the left atrial appendage. Thorough TEE imaging is performed to evaluate all PASS criteria prior to device release. After device release, the device remains in stable position with complete occlusion of the appendage. The sheath is removed from the body and the previously deployed perclose sutures are tightened for complete hemostasis.   Note there was a slight increase in transverse sinus fluid during the procedure, first identified after the Watchman access sheath is inserted in the posterior portio of the left atrium. This  remained stable throughout the remainder of the procedure. The patient remained hemodynamically stable throughout the procedure.

## 2020-07-01 NOTE — Interval H&P Note (Signed)
History and Physical Interval Note:  07/01/2020 7:30 AM  [name]  has presented today for surgery, with the diagnosis of afib.  The various methods of treatment have been discussed with the patient and family. After consideration of risks, benefits and other options for treatment, the patient has consented to  Procedure(s): LEFT ATRIAL APPENDAGE OCCLUSION (N/A) TRANSESOPHAGEAL ECHOCARDIOGRAM (TEE) (N/A) as a surgical intervention.  The patient's history has been reviewed, patient examined, no change in status, stable for surgery.  I have reviewed the patient's chart and labs.  Questions were answered to the patient's satisfaction.     Sonnet Rizor T Amaira Safley

## 2020-07-01 NOTE — Anesthesia Procedure Notes (Signed)
Central Venous Catheter Insertion Performed by: Roderic Palau, MD, anesthesiologist Start/End11/11/2019 7:20 AM, 07/01/2020 7:30 AM Patient location: Pre-op. Preanesthetic checklist: patient identified, IV checked, site marked, risks and benefits discussed, surgical consent, monitors and equipment checked, pre-op evaluation, timeout performed and anesthesia consent Position: Trendelenburg Lidocaine 1% used for infiltration and patient sedated Hand hygiene performed , maximum sterile barriers used  and Seldinger technique used Catheter size: 8 Fr Total catheter length 16. Central line was placed.Double lumen Procedure performed using ultrasound guided technique. Ultrasound Notes:anatomy identified, needle tip was noted to be adjacent to the nerve/plexus identified, no ultrasound evidence of intravascular and/or intraneural injection and image(s) printed for medical record Attempts: 1 Following insertion, dressing applied, line sutured and Biopatch. Post procedure assessment: blood return through all ports  Patient tolerated the procedure well with no immediate complications.

## 2020-07-01 NOTE — Anesthesia Postprocedure Evaluation (Signed)
Anesthesia Post Note  Patient: [name]  Procedure(s) Performed: LEFT ATRIAL APPENDAGE OCCLUSION (N/A )     Patient location during evaluation: PACU Anesthesia Type: General Level of consciousness: awake and alert Pain management: pain level controlled Vital Signs Assessment: post-procedure vital signs reviewed and stable Respiratory status: spontaneous breathing, nonlabored ventilation and respiratory function stable Cardiovascular status: blood pressure returned to baseline and stable Postop Assessment: no apparent nausea or vomiting Anesthetic complications: no   No complications documented.  Last Vitals:  Vitals:   07/01/20 1105 07/01/20 1115  BP: (!) 136/48 (!) 139/46  Pulse: 70 66  Resp: 17 15  Temp:    SpO2: 99% 99%    Last Pain:  Vitals:   07/01/20 1058  TempSrc: Temporal  PainSc: 0-No pain                 Monquie Fulgham,W. EDMOND

## 2020-07-01 NOTE — Progress Notes (Signed)
Pt transferred from Cath lab. R groin assessed. Level 0. Gauze and Tegaderm dressing in place. RN noticed R groin skin tear (scant blood) outline of groin dressing. Source unknown when asked Debbie, RN from Cath lab. Will document and continue to monitor pt. Vitals stable, on room air.

## 2020-07-01 NOTE — Progress Notes (Signed)
Echocardiogram Echocardiogram Transesophageal has been performed as part of Watchman procedure.  [name] 07/01/2020, 10:30 AM

## 2020-07-01 NOTE — Anesthesia Procedure Notes (Signed)
Procedure Name: Intubation Date/Time: 07/01/2020 8:00 AM Performed by: Moshe Salisbury, CRNA Pre-anesthesia Checklist: Patient identified, Emergency Drugs available, Suction available and Patient being monitored Patient Re-evaluated:Patient Re-evaluated prior to induction Oxygen Delivery Method: Circle System Utilized Preoxygenation: Pre-oxygenation with 100% oxygen Induction Type: IV induction Ventilation: Mask ventilation without difficulty Laryngoscope Size: Mac and 3 Grade View: Grade II Tube type: Oral Tube size: 7.5 mm Number of attempts: 1 Airway Equipment and Method: Stylet Placement Confirmation: ETT inserted through vocal cords under direct vision,  positive ETCO2 and breath sounds checked- equal and bilateral Secured at: 21 cm Tube secured with: Tape Dental Injury: Teeth and Oropharynx as per pre-operative assessment

## 2020-07-01 NOTE — Discharge Instructions (Signed)
[facility] Procedure, Care After  Procedure MD: Dr. Benson Norway Clinical Coordinator: Ambrose Pancoast, RN  This sheet gives you information about how to care for yourself after your procedure. Your health care provider may also give you more specific instructions. If you have problems or questions, contact your health care provider.  What can I expect after the procedure? After the procedure, it is common to have:  Bruising around your puncture site.  Tenderness around your puncture site.  Tiredness (fatigue).  Medication instructions . It is very important to continue to take your Eliquis as well as Aspirin after the Watchman procedure. Call your procedure doctor's office with question or concerns. . If you are on Coumadin (warfarin), you will have your INR checked the week after your procedure, with a goal INR of 2.0 - 3.0. Marland Kitchen Please follow your medication instructions on your discharge summary. Only take the medications listed on your discharge paperwork.  Follow up . You will be seen in 1 week and 6 weeks after your procedure in the [facility]illation [facility] . You will have another TEE (Transesophageal Echocardiogram) approximately 6 weeks after your procedure mark to check your device . You will follow up the MD who performed your procedure 6 months after your procedure . The Watchman Clinical Coordinator will check in with you from time to time, including 1 and 2 years after your procedure.    Follow these instructions at home: Puncture site care   Follow instructions from your health care provider about how to take care of your puncture site. Make sure you: ? If present, leave stitches (sutures), skin glue, or adhesive strips in place.  ? If a large square bandage is present, this may be removed 24 hours after surgery.   Check your puncture site every day for signs of infection. Check for: ? Redness, swelling, or pain. ? Fluid or blood. If your puncture site starts to  bleed, lie down on your back, apply firm pressure to the area, and contact your health care provider. ? Warmth. ? Pus or a bad smell. Driving  Do not drive yourself home if you received sedation  Do not drive for at least 4 days after your procedure or however long your health care provider recommends. (Do not resume driving if you have previously been instructed not to drive for other health reasons.)  Do not spend greater than 1 hour at a time in a car for the first 3 days. Stop and take a break with a 5 minute walk at least every hour.   Do not drive or use heavy machinery while taking prescription pain medicine.  Activity  Avoid activities that take a lot of effort, including exercise, for at least 7 days after your procedure.  For the first 3 days, avoid sitting for longer than one hour at a time.   Avoid alcoholic beverages, signing paperwork, or participating in legal proceedings for 24 hours after receiving sedation  Do not lift anything that is heavier than 10 lb (4.5 kg) for one week.   No sexual activity for 1 week.   Return to your normal activities as told by your health care provider. Ask your health care provider what activities are safe for you. General instructions  Take over-the-counter and prescription medicines only as told by your health care provider.  Do not use any products that contain nicotine or tobacco, such as cigarettes and e-cigarettes. If you need help quitting, ask your health care provider.  You may  shower after 24 hours, but Do not take baths, swim, or use a hot tub for 1 week.   Do not drink alcohol for 24 hours after your procedure.  Keep all follow-up visits as told by your health care provider. This is important.  Dental Work: You will require antibiotics prior to any dental work, including cleanings, for 6 months after your Watchman implantation to help protect you from infection. After 6 months, antibiotics are no longer  required. Contact a health care provider if:  You have redness, mild swelling, or pain around your puncture site.  You have soreness in your throat or at your puncture site that does not improve after several days  You have fluid or blood coming from your puncture site that stops after applying firm pressure to the area.  Your puncture site feels warm to the touch.  You have pus or a bad smell coming from your puncture site.  You have a fever.  You have chest pain or discomfort that spreads to your neck, jaw, or arm.  You are sweating a lot.  You feel nauseous.  You have a fast or irregular heartbeat.  You have shortness of breath.  You are dizzy or light-headed and feel the need to lie down.  You have pain or numbness in the arm or leg closest to your puncture site. Get help right away if:  Your puncture site suddenly swells.  Your puncture site is bleeding and the bleeding does not stop after applying firm pressure to the area. These symptoms may represent a serious problem that is an emergency. Do not wait to see if the symptoms will go away. Get medical help right away. Call your local emergency services (911 in the [place] Do not drive yourself to the hospital. Summary  After the procedure, it is normal to have bruising and tenderness at the puncture site in your groin, neck, or forearm.  Check your puncture site every day for signs of infection.  Get help right away if your puncture site is bleeding and the bleeding does not stop after applying firm pressure to the area. This is a medical emergency.  This information is not intended to replace advice given to you by your health care provider. Make sure you discuss any questions you have with your health care provider.

## 2020-07-01 NOTE — Plan of Care (Signed)

## 2020-07-01 NOTE — Progress Notes (Signed)
Rt radial arterial line d/c'ed. Manual pressure x 10 minutes. Level 0. Rt radial palpable 3+. Gauze and tegaderm dressing

## 2020-07-01 NOTE — Discharge Summary (Signed)
ELECTROPHYSIOLOGY PROCEDURE DISCHARGE SUMMARY    Patient ID: [name],  MRN: [id number], DOB/AGE: 79-01-1941 79 y.o.  Admit date: 07/01/2020 Discharge date: 07/02/2020  Primary Care Physician: Leighton Ruff, MD  Primary Cardiologist: Candee Furbish, MD  Electrophysiologist: Vickie Epley, MD   Primary Discharge Diagnosis:  Paroxysmal Atrial Fibrillation History of GI bleed  Secondary Discharge Diagnosis:  Small pericardial effusion  Procedures This Admission:  1. Transeptal Puncture 2. Intra procedural TEE 3. Left atrial appendage occlusive device placement on 07/01/2020 by Dr. Quentin Ore. This study demonstrated: 1.  Successful implantation of a WATCHMAN left atrial appendage occlusive device (27 mm) 2.  TEE demonstrating no LAA thrombus 3.  Small pericardial effusion at the beginning of the case.  Slight increase in fluid in the transverse sinus by the end of the procedure, stable throughout the entire procedure.  Brief HPI: [name] is a 79 y.o. female with a history of Paroxysmal Atrial Fibrillation. The patient was seen as an outpatient and thought to be a poor candidate for continued anticoagulation due to recurrent GI bleeding requiring argon laser therapy. Risks, benefits, and alternatives to left atrial appendage occlusive device placement device were reviewed with the patient who wished to proceed.    Hospital Course:  The patient was admitted and underwent transeptal puncture, TEE, and left atrial appendage occlusive device placement with details as outlined above.  They were monitored on telemetry overnight which demonstrated NSR 80-90s.  Groin was without complication on the day of discharge.  The patient was examined and considered to be stable for discharge.  Wound care and restrictions were reviewed with the patient.  The patient will be seen back by Adline Peals, PA in 1 week and approximately 6 weeks with repeat TEE in approximately 45 days post  procedure to ensure proper seal of the device. They will follow up with Dr. Quentin Ore in 6 months for post watchman assessment.  Dr. Burt Knack reviewed limited echo 11/5 and discussed personally with Dr. Quentin Ore. Trivial perfusion. OK to resume eliquis this am and discharge home with follow up as below.   CHA2DS2/VAS Stroke Risk Points  Current as of 12 minutes ago    5 >= 2 Points: High Risk 1 - 1.99 Points: Medium Risk 0 Points: Low Risk   No Change    Details   This score determines the patient's risk of having a stroke if the patient has atrial fibrillation.    Points Metrics 0 Has Congestive Heart Failure:  No   Current as of 12 minutes ago 0 Has Vascular Disease:  No   Current as of 12 minutes ago 1 Has Hypertension:  Yes   Current as of 12 minutes ago 2 Age:  3   Current as of 12 minutes ago 1 Has Diabetes:  Yes   Current as of 12 minutes ago 0 Had Stroke:  No  Had TIA:  No  Had thromboembolism:  No   Current as of 12 minutes ago 1 Female:  Yes   Current as of 12 minutes ago          The patient understands the importance of continuing on their Eliquis and a 81 mg ASA for 45 days. They will then take 81 mg ASA and Plavix for a total of 6 months post procedure. Pt is aware not to change her medications on her own, and only change her anticoagulation after a visit and discussion with her providers.   Physical Exam: Vitals:  07/01/20 2320 07/02/20 0340 07/02/20 0402 07/02/20 0745  BP: (!) 124/56 124/60    Pulse: 82     Resp: 19 20  20   Temp: 98.7 F (37.1 C) 98.5 F (36.9 C) 99.5 F (37.5 C)   TempSrc: Oral Oral Oral   SpO2: 93% 91%    Weight:      Height:       GEN- The patient is elderly appearing, alert and oriented x 3 today.   HEENT: normocephalic, atraumatic; sclera clear, conjunctiva pink; hearing intact; oropharynx clear; neck supple  Lungs- Clear to ausculation bilaterally, normal work of breathing.  No wheezes, rales, rhonchi Heart- Regular rate and rhythm, no murmurs, rubs or  gallops  GI- soft, non-tender, non-distended, bowel sounds present  Extremities- no clubbing, cyanosis, or edema; DP/PT/radial pulses 2+ bilaterally, groin without hematoma/bruit MS- no significant deformity or atrophy Skin- warm and dry, no rash or lesion Psych- euthymic mood, full affect Neuro- strength and sensation are intact  Labs:   Lab Results  Component Value Date   WBC 8.6 06/29/2020   HGB 10.8 (L) 06/29/2020   HCT 33.5 (L) 06/29/2020   MCV 90 06/29/2020   PLT 311 06/29/2020    Recent Labs  Lab 07/02/20 0345  NA 134*  K 4.3  CL 99  CO2 25  BUN 25*  CREATININE 1.05*  CALCIUM 8.7*  GLUCOSE 240*     Discharge Medications:  Allergies as of 07/02/2020      Reactions   Vasotec Shortness Of Breath, Other (See Comments)   wheezing   Atorvastatin Other (See Comments)   Leg cramps   Codeine Nausea And Vomiting   Cymbalta [duloxetine Hcl] Itching   Lansoprazole Itching, Swelling, Rash, Other (See Comments)   Redness, swelling of mouth    Morphine And Related Itching   Sulfate Itching   Tramadol Itching   Adhesive [tape] Rash   Scopolamine Rash      Medication List    TAKE these medications   acetaminophen 500 MG tablet Commonly known as: TYLENOL Take 1,000 mg by mouth every 8 (eight) hours as needed for moderate pain.   albuterol 108 (90 Base) MCG/ACT inhaler Commonly known as: ProAir HFA Inhale 2 puffs into the lungs every 4 (four) hours as needed for wheezing or shortness of breath.   ALPRAZolam 0.25 MG tablet Commonly known as: XANAX Take 1 tablet by mouth at bedtime.   amLODipine 10 MG tablet Commonly known as: NORVASC Take 10 mg by mouth daily.   aspirin EC 81 MG tablet Take 81 mg by mouth daily. Swallow whole.   colchicine 0.6 MG tablet Take 1 tablet (0.6 mg total) by mouth 2 (two) times daily for 5 days.   diphenhydrAMINE 25 MG tablet Commonly known as: BENADRYL Take 25 mg by mouth daily as needed for allergies.   docusate sodium 100  MG capsule Commonly known as: COLACE Take 100 mg by mouth 2 (two) times daily.   Droplet Pen Needles 32G X 4 MM Misc Generic drug: Insulin Pen Needle   Eliquis 5 MG Tabs tablet Generic drug: apixaban TAKE 1 TABLET TWICE DAILY What changed: how much to take   esomeprazole 40 MG capsule Commonly known as: NEXIUM Take 40 mg by mouth in the morning and at bedtime. Morning & afternoon.   fluticasone 50 MCG/ACT nasal spray Commonly known as: FLONASE Place into both nostrils daily.   Fusion Plus Caps Take 1 capsule by mouth every evening.   gabapentin 300 MG capsule  Commonly known as: NEURONTIN Take 600 mg by mouth in the morning, at noon, and at bedtime.   Lantus SoloStar 100 UNIT/ML Solostar Pen Generic drug: insulin glargine Inject 30 Units into the skin at bedtime.   Livalo 1 MG Tabs Generic drug: Pitavastatin Calcium Take 1 mg by mouth at bedtime.   losartan 100 MG tablet Commonly known as: COZAAR Take 100 mg by mouth daily.   metoprolol tartrate 50 MG tablet Commonly known as: LOPRESSOR Take 75-100 mg by mouth See admin instructions. Take 2 tablets (100 mg) by mouth in the morning & take 1.5 tablets (75 mg) by mouth in the evening.   NovoLOG FlexPen 100 UNIT/ML FlexPen Generic drug: insulin aspart Inject 16 Units into the skin in the morning, at noon, and at bedtime. Sliding scale insulin   ondansetron 4 MG tablet Commonly known as: ZOFRAN Take 4 mg by mouth every 8 (eight) hours as needed for nausea or vomiting.   PRESERVISION AREDS 2 PO Take 1 capsule by mouth 2 (two) times daily.   triamterene-hydrochlorothiazide 37.5-25 MG tablet Commonly known as: MAXZIDE-25 Take 2 capsules by mouth daily.   venlafaxine 37.5 MG tablet Commonly known as: EFFEXOR Take 37.5 mg by mouth 2 (two) times daily.       Disposition:    Follow-up Information    Vickie Epley, MD Follow up on 07/07/2020.   Specialties: Cardiology, Radiology Why: at 230 for post  Watchman follow up Contact information: [place] [address] [phone]        [place] Follow up on 07/07/2020.   Why: Please arrive by 140 for limited echo at 205 prior to your visit with Dr. Vonzell Schlatter information: [place] [id number] [place] Follow up on 07/09/2020.   Specialty: Cardiology Why: at 0900 for post hospital follow up. Code for parking is [id number]. Contact information: [address] [id number] [place] [address] [phone]              Duration of Discharge Encounter: Greater than 30 minutes including physician time.  Jacalyn Lefevre, PA-C  07/02/2020 10:30 AM

## 2020-07-02 ENCOUNTER — Inpatient Hospital Stay (HOSPITAL_COMMUNITY): Payer: Medicare PPO

## 2020-07-02 ENCOUNTER — Other Ambulatory Visit: Payer: Self-pay | Admitting: Student

## 2020-07-02 ENCOUNTER — Other Ambulatory Visit: Payer: Self-pay

## 2020-07-02 DIAGNOSIS — I313 Pericardial effusion (noninflammatory): Secondary | ICD-10-CM

## 2020-07-02 DIAGNOSIS — I4819 Other persistent atrial fibrillation: Secondary | ICD-10-CM

## 2020-07-02 DIAGNOSIS — I3139 Other pericardial effusion (noninflammatory): Secondary | ICD-10-CM

## 2020-07-02 LAB — BASIC METABOLIC PANEL
Anion gap: 10 (ref 5–15)
BUN: 25 mg/dL — ABNORMAL HIGH (ref 8–23)
CO2: 25 mmol/L (ref 22–32)
Calcium: 8.7 mg/dL — ABNORMAL LOW (ref 8.9–10.3)
Chloride: 99 mmol/L (ref 98–111)
Creatinine, Ser: 1.05 mg/dL — ABNORMAL HIGH (ref 0.44–1.00)
GFR, Estimated: 54 mL/min — ABNORMAL LOW (ref >60)
Glucose, Bld: 240 mg/dL — ABNORMAL HIGH (ref 70–99)
Potassium: 4.3 mmol/L (ref 3.5–5.1)
Sodium: 134 mmol/L — ABNORMAL LOW (ref 135–145)

## 2020-07-02 LAB — ECHOCARDIOGRAM LIMITED
Area-P 1/2: 4.68 cm2
Height: 64 in
S' Lateral: 3 cm
Weight: 2480 oz

## 2020-07-02 LAB — GLUCOSE, CAPILLARY
Glucose-Capillary: 215 mg/dL — ABNORMAL HIGH (ref 70–99)
Glucose-Capillary: 277 mg/dL — ABNORMAL HIGH (ref 70–99)

## 2020-07-02 MED ORDER — KETOROLAC TROMETHAMINE 30 MG/ML IJ SOLN
30.0000 mg | Freq: Once | INTRAMUSCULAR | Status: AC
  Administered 2020-07-02: 30 mg via INTRAVENOUS
  Filled 2020-07-02: qty 1

## 2020-07-02 MED ORDER — APIXABAN 5 MG PO TABS
5.0000 mg | ORAL_TABLET | Freq: Two times a day (BID) | ORAL | Status: DC
  Administered 2020-07-02: 5 mg via ORAL
  Filled 2020-07-02: qty 1

## 2020-07-02 MED ORDER — COLCHICINE 0.6 MG PO TABS
0.6000 mg | ORAL_TABLET | Freq: Two times a day (BID) | ORAL | Status: DC
  Administered 2020-07-02: 0.6 mg via ORAL
  Filled 2020-07-02: qty 1

## 2020-07-02 MED ORDER — COLCHICINE 0.6 MG PO TABS: 0.6000 mg | ORAL_TABLET | Freq: Two times a day (BID) | ORAL | 0 refills | Status: DC

## 2020-07-02 MED FILL — Lidocaine HCl Local Preservative Free (PF) Inj 1%: INTRAMUSCULAR | Qty: 30 | Status: AC

## 2020-07-02 NOTE — Progress Notes (Signed)
Pt discharge home. PIV and central line d/c. Discharge information given to pt and husband. All questions answered. Vitals stable.

## 2020-07-02 NOTE — Progress Notes (Signed)
  Dr Quentin Ore has seen this am  Pt doing well overall.   Mild pericarditic symptoms, plan course of colchicine and close follow up.   Discharge pending echo this am.   Legrand Como [address] [place], [place]  07/02/2020 9:18 AM

## 2020-07-02 NOTE — Plan of Care (Signed)

## 2020-07-02 NOTE — Progress Notes (Signed)
Patient c/o 9 out of 10 mid chest pressure.  Denies any pain in arms or back. States the pain in her chest is worse when she lays on her back.  12 lead EKG obtained, no changes from prior.  She states that the pain is similar to earlier in the evening for which she received IV Toradol that was effective in pain management.  MD notified of this via page.  No new orders. Patient states she would feel better if she could just "get the mucous up".  She does have a weak, dry cough.  No changes in lung sounds, still clear in the upper lobes and diminished in the bases.  She is concerned that she has a fever, temperature is 99.5.  Patient given PRN dose of Tylenol.  Will continue to monitor.

## 2020-07-02 NOTE — Progress Notes (Signed)
  Echocardiogram 2D Echocardiogram has been performed.  [name] 07/02/2020, 10:03 AM

## 2020-07-05 ENCOUNTER — Telehealth: Payer: Self-pay | Admitting: Nurse Practitioner

## 2020-07-05 NOTE — Telephone Encounter (Signed)
Contacted Mrs. Machi this morning to follow up on her Watchman implant and to see if she has any questions or concerns.She reports that she is doing well and has no questions at this time. She denies any pain in her chest or new SOB. We spent time reviewing her upcoming appointments at the AF clinic on Friday and Echo on Wednesday. She was instructed to call me if she had any questions about the Watchman implant.  Jaquelyn Bitter Lonzo Saulter,RN

## 2020-07-07 ENCOUNTER — Ambulatory Visit (HOSPITAL_COMMUNITY): Payer: Medicare PPO | Attending: Cardiovascular Disease

## 2020-07-07 ENCOUNTER — Ambulatory Visit (INDEPENDENT_AMBULATORY_CARE_PROVIDER_SITE_OTHER): Payer: Medicare PPO | Admitting: Cardiology

## 2020-07-07 ENCOUNTER — Other Ambulatory Visit: Payer: Self-pay

## 2020-07-07 DIAGNOSIS — Z8719 Personal history of other diseases of the digestive system: Secondary | ICD-10-CM

## 2020-07-07 DIAGNOSIS — I313 Pericardial effusion (noninflammatory): Secondary | ICD-10-CM | POA: Diagnosis not present

## 2020-07-07 DIAGNOSIS — I480 Paroxysmal atrial fibrillation: Secondary | ICD-10-CM

## 2020-07-07 DIAGNOSIS — I3139 Other pericardial effusion (noninflammatory): Secondary | ICD-10-CM

## 2020-07-07 LAB — ECHOCARDIOGRAM LIMITED
Area-P 1/2: 2.85 cm2
S' Lateral: 2.9 cm

## 2020-07-07 NOTE — Progress Notes (Signed)
Electrophysiology Office Follow up Visit Note:    Date:  07/07/2020   ID:  [name], DOB 08-Oct-1940, MRN [id number]  PCP:  Leighton Ruff, MD  [facility] Cardiologist:  Candee Furbish, MD  [facility] Electrophysiologist:  Vickie Epley, MD    Interval History:    [name] is a 79 y.o. female who presents for a follow up visit after her watchman implantation on July 01, 2020.  During the procedure she underwent a successful implantation of a #27 watchman device.  During the procedure there was a slight increase in the fluid in the transverse sinus noted on transesophageal echocardiogram.  This fluid was monitored for over 30 minutes during the procedure while completely heparinized without any change.  After the procedure was over she was monitored overnight and then had a repeat echo the next morning showing no effusion.  Eliquis was started in addition to an 81 mg aspirin prior to discharge.    The watchman coordinator reached out to the patient on November 8 via telephone.  She reported doing well without any symptoms of shortness of breath or chest pain.   Today she tells me she is doing well.  The colchicine that I prescribed after the procedure caused GI upset and she self discontinued.  Symptoms have resolved.  She also has some bruising on her arms from IV sites.  Right IJ catheter site is healing well but there is a small nodule at the small incision site.    Past Medical History:  Diagnosis Date  . Acute GI bleeding 05/13/2015  . Anxiety   . Arthritis   . Asthma   . Atrial fibrillation ([place]) 10/29/2017  . Bakers cyst, left   . Chronic kidney disease    stage 3  . Constipation   . Diabetes mellitus    type 2  . Dysrhythmia   . GERD (gastroesophageal reflux disease)   . Heart murmur   . History of cellulitis   . History of dizziness   . History of hiatal hernia    tiny 06/15/2016  . Hypertension   . Iron deficiency anemia   . Peripheral  vascular disease ([place])   . PONV (postoperative nausea and vomiting)   . Pulmonary nodule     Past Surgical History:  Procedure Laterality Date  . APPENDECTOMY  1952  . BIOPSY  10/16/2018   Procedure: BIOPSY;  Surgeon: Clarene Essex, MD;  Location: [facility];  Service: Endoscopy;;  . BIOPSY  11/26/2019   Procedure: BIOPSY;  Surgeon: Clarene Essex, MD;  [facility];  Service: Endoscopy;;  . COLONOSCOPY W/ POLYPECTOMY  05/15/2013  . ESOPHAGOGASTRODUODENOSCOPY (EGD) WITH PROPOFOL N/A 06/15/2016   Procedure: ESOPHAGOGASTRODUODENOSCOPY (EGD) WITH PROPOFOL;  Surgeon: Clarene Essex, MD;  [facility];  Service: Endoscopy;  Laterality: N/A;  . ESOPHAGOGASTRODUODENOSCOPY (EGD) WITH PROPOFOL N/A 10/16/2018   Procedure: ESOPHAGOGASTRODUODENOSCOPY (EGD) WITH PROPOFOL;  Surgeon: Clarene Essex, MD;  [facility];  Service: Endoscopy;  Laterality: N/A;  . ESOPHAGOGASTRODUODENOSCOPY (EGD) WITH PROPOFOL N/A 11/26/2019   Procedure: ESOPHAGOGASTRODUODENOSCOPY (EGD) WITH PROPOFOL;  Surgeon: Clarene Essex, MD;  [facility];  Service: Endoscopy;  Laterality: N/A;  . EYE SURGERY Bilateral    cataracts removed  . GI RADIOFREQUENCY ABLATION  10/16/2018   Procedure: GI RADIOFREQUENCY ABLATION;  Surgeon: Clarene Essex, MD;  [facility];  Service: Endoscopy;;  . GI RADIOFREQUENCY ABLATION N/A 11/26/2019   Procedure: GI RADIOFREQUENCY ABLATION;  Surgeon: Clarene Essex, MD;  [facility];  Service: Endoscopy;  Laterality: N/A;  . HAND SURGERY Left 2010   operated on index and ring finger  . INCONTINENCE SURGERY    . JOINT REPLACEMENT Right    knee  . KNEE ARTHROSCOPY  2003 2005   right knee  . KYPHOPLASTY N/A 01/29/2020   Procedure: T8 KYPHOPLASTY;  Surgeon: Melina Schools, MD;  Location: [place];  Service: Orthopedics;  Laterality: N/A;  60 mins Local with [facility]  . LEFT ATRIAL APPENDAGE OCCLUSION N/A 07/01/2020   Procedure: LEFT ATRIAL APPENDAGE OCCLUSION;   Surgeon: Vickie Epley, MD;  Location: [place] [facility];  Service: Cardiovascular;  Laterality: N/A;  . mass fallopian tube    . SPINAL CORD STIMULATOR BATTERY EXCHANGE N/A 09/09/2015   Procedure: SPINAL CORD STIMULATOR BATTERY EXCHANGE;  Surgeon: Melina Schools, MD;  Location: [place];  Service: Orthopedics;  Laterality: N/A;  . TOTAL KNEE ARTHROPLASTY Left 11/05/2017   Procedure: LEFT TOTAL KNEE ARTHROPLASTY;  Surgeon: Paralee Cancel, MD;  [facility];  Service: Orthopedics;  Laterality: Left;  Adductor Block  . VAGINAL HYSTERECTOMY  1977   1 ovary removed    Current Medications: No outpatient medications have been marked as taking for the 07/07/20 encounter (Office Visit) with Vickie Epley, MD.     Allergies:   Vasotec, Atorvastatin, Codeine, Cymbalta [duloxetine hcl], Lansoprazole, Morphine and related, Sulfate, Tramadol, Adhesive [tape], and Scopolamine   Social History   Socioeconomic History  . Marital status: Married    Spouse name: Charlotte Crumb  . Number of children: 2  . Years of education: 5  . Highest education level: Not on file  Occupational History    Comment: retired Pharmacist, hospital, IT sales professional  Tobacco Use  . Smoking status: Never Smoker  . Smokeless tobacco: Never Used  Vaping Use  . Vaping Use: Never used  Substance and Sexual Activity  . Alcohol use: No  . Drug use: No  . Sexual activity: Never    Birth control/protection: Surgical  Other Topics Concern  . Not on file  Social History Narrative   Lives with husband   Caffeine use- none   Social Determinants of Health   Financial Resource Strain:   . Difficulty of Paying Living Expenses: Not on file  Food Insecurity:   . Worried About Charity fundraiser in the Last Year: Not on file  . Ran Out of Food in the Last Year: Not on file  Transportation Needs:   . Lack of Transportation (Medical): Not on file  . Lack of Transportation (Non-Medical): Not on file  Physical Activity:   . Days of  Exercise per Week: Not on file  . Minutes of Exercise per Session: Not on file  Stress:   . Feeling of Stress : Not on file  Social Connections:   . Frequency of Communication with Friends and Family: Not on file  . Frequency of Social Gatherings with Friends and Family: Not on file  . Attends Religious Services: Not on file  . Active Member of Clubs or Organizations: Not on file  . Attends Archivist Meetings: Not on file  . Marital Status: Not on file     Family History: The patient's family history includes Heart attack in her father; Lung cancer in her mother. There is no history of Colon cancer, Stroke, or Migraines.  ROS:   Please see the history of present illness.    All other systems reviewed and are negative.  EKGs/Labs/Other Studies Reviewed:  The following studies were reviewed today: Hospital records, November 5 echo   July 02, 2020 echo personally reviewed No pericardial effusion   July 07, 2020 Limited echo personally reviewed No pericardial effusion   Recent Labs: 06/29/2020: Hemoglobin 10.8; Platelets 311 07/02/2020: BUN 25; Creatinine, Ser 1.05; Potassium 4.3; Sodium 134  Recent Lipid Panel No results found for: CHOL, TRIG, HDL, CHOLHDL, VLDL, LDLCALC, LDLDIRECT  Physical Exam:     RR 12, HR 70, AF, 130/60, 5'5'', 153lb   Wt Readings from Last 3 Encounters:  07/01/20 155 lb (70.3 kg)  06/29/20 153 lb 3.2 oz (69.5 kg)  05/25/20 155 lb (70.3 kg)     GEN: Well nourished, well developed in no acute distress HEENT: Normal NECK: No JVD; No carotid bruits.  Right IJ site well-healed with a small nodule at the prior incision site LYMPHATICS: No lymphadenopathy CARDIAC: RRR, no murmurs, rubs, gallops RESPIRATORY:  Clear to auscultation without rales, wheezing or rhonchi  ABDOMEN: Soft, non-tender, non-distended MUSCULOSKELETAL:  No edema; No deformity  SKIN: Warm and dry.  Ecchymoses on bilateral lower arms at prior IV  sites NEUROLOGIC:  Alert and oriented x 3 PSYCHIATRIC:  Normal affect   ASSESSMENT:    1. Paroxysmal atrial fibrillation (HCC)   2. History of GI bleed    PLAN:    In order of problems listed above:  1. Paroxysmal atrial fibrillation and GI bleeds She is post watchman implant on November 4.  She has been doing well since the implant of the device on Eliquis twice daily and aspirin 81 mg once daily.  Plan to continue these medications until her 45-day follow-up transesophageal echocardiogram. Limited echo today shows no evidence of pericardial effusion. No evidence of GI bleeding on Eliquis.  Medication Adjustments/Labs and Tests Ordered: Current medicines are reviewed at length with the patient today.  Concerns regarding medicines are outlined above.  Orders Placed This Encounter  Procedures  . EKG 12-Lead   No orders of the defined types were placed in this encounter.    Signed, Lars Mage, MD, [facility]  07/07/2020 3:00 PM    Electrophysiology [place]

## 2020-07-07 NOTE — Patient Instructions (Signed)
Medication Instructions:  Your physician recommends that you continue on your current medications as directed. Please refer to the Current Medication list given to you today.  Labwork: None ordered.  Testing/Procedures: None ordered.  Follow-Up: Your physician wants you to follow-up in: 6 weeks with Dr. Quentin Ore.     Doylene Canning will call you to schedule this appointment. Gracy Bruins [phone]  Any Other Special Instructions Will Be Listed Below (If Applicable).  If you need a refill on your cardiac medications before your next appointment, please call your pharmacy.

## 2020-07-09 ENCOUNTER — Other Ambulatory Visit: Payer: Self-pay

## 2020-07-09 ENCOUNTER — Ambulatory Visit
Admission: RE | Admit: 2020-07-09 | Discharge: 2020-07-09 | Disposition: A | Discharge status: Home / Self Care | Payer: Medicare PPO | Source: Ambulatory Visit | Attending: Family Medicine | Admitting: Family Medicine

## 2020-07-09 ENCOUNTER — Encounter (HOSPITAL_COMMUNITY): Payer: Medicare PPO | Admitting: Physician Assistant

## 2020-07-09 DIAGNOSIS — R928 Other abnormal and inconclusive findings on diagnostic imaging of breast: Secondary | ICD-10-CM

## 2020-07-09 DIAGNOSIS — N6312 Unspecified lump in the right breast, upper inner quadrant: Secondary | ICD-10-CM | POA: Diagnosis not present

## 2020-07-12 ENCOUNTER — Encounter (HOSPITAL_COMMUNITY): Payer: Self-pay

## 2020-07-12 ENCOUNTER — Emergency Department (HOSPITAL_COMMUNITY): Payer: Medicare PPO

## 2020-07-12 ENCOUNTER — Inpatient Hospital Stay (HOSPITAL_COMMUNITY)
Admission: EM | Admit: 2020-07-12 | Discharge: 2020-07-15 | DRG: 377 | Disposition: A | Discharge status: Home Health | Payer: Medicare PPO | Attending: Internal Medicine | Admitting: Internal Medicine

## 2020-07-12 ENCOUNTER — Other Ambulatory Visit: Payer: Self-pay

## 2020-07-12 DIAGNOSIS — I5041 Acute combined systolic (congestive) and diastolic (congestive) heart failure: Secondary | ICD-10-CM | POA: Diagnosis not present

## 2020-07-12 DIAGNOSIS — Z8249 Family history of ischemic heart disease and other diseases of the circulatory system: Secondary | ICD-10-CM

## 2020-07-12 DIAGNOSIS — I4819 Other persistent atrial fibrillation: Secondary | ICD-10-CM | POA: Diagnosis not present

## 2020-07-12 DIAGNOSIS — Z96653 Presence of artificial knee joint, bilateral: Secondary | ICD-10-CM | POA: Diagnosis present

## 2020-07-12 DIAGNOSIS — Z95818 Presence of other cardiac implants and grafts: Secondary | ICD-10-CM

## 2020-07-12 DIAGNOSIS — I130 Hypertensive heart and chronic kidney disease with heart failure and stage 1 through stage 4 chronic kidney disease, or unspecified chronic kidney disease: Secondary | ICD-10-CM | POA: Diagnosis present

## 2020-07-12 DIAGNOSIS — E1122 Type 2 diabetes mellitus with diabetic chronic kidney disease: Secondary | ICD-10-CM | POA: Diagnosis present

## 2020-07-12 DIAGNOSIS — R0602 Shortness of breath: Secondary | ICD-10-CM

## 2020-07-12 DIAGNOSIS — R195 Other fecal abnormalities: Secondary | ICD-10-CM | POA: Diagnosis not present

## 2020-07-12 DIAGNOSIS — K31811 Angiodysplasia of stomach and duodenum with bleeding: Principal | ICD-10-CM | POA: Diagnosis present

## 2020-07-12 DIAGNOSIS — I480 Paroxysmal atrial fibrillation: Secondary | ICD-10-CM | POA: Diagnosis not present

## 2020-07-12 DIAGNOSIS — R252 Cramp and spasm: Secondary | ICD-10-CM | POA: Diagnosis not present

## 2020-07-12 DIAGNOSIS — I5020 Unspecified systolic (congestive) heart failure: Secondary | ICD-10-CM | POA: Diagnosis not present

## 2020-07-12 DIAGNOSIS — R0600 Dyspnea, unspecified: Secondary | ICD-10-CM | POA: Diagnosis present

## 2020-07-12 DIAGNOSIS — M1990 Unspecified osteoarthritis, unspecified site: Secondary | ICD-10-CM | POA: Diagnosis not present

## 2020-07-12 DIAGNOSIS — J45909 Unspecified asthma, uncomplicated: Secondary | ICD-10-CM | POA: Diagnosis not present

## 2020-07-12 DIAGNOSIS — K2100 Gastro-esophageal reflux disease with esophagitis, without bleeding: Secondary | ICD-10-CM | POA: Diagnosis not present

## 2020-07-12 DIAGNOSIS — K922 Gastrointestinal hemorrhage, unspecified: Secondary | ICD-10-CM | POA: Diagnosis not present

## 2020-07-12 DIAGNOSIS — Z86010 Personal history of colonic polyps: Secondary | ICD-10-CM

## 2020-07-12 DIAGNOSIS — Z885 Allergy status to narcotic agent status: Secondary | ICD-10-CM

## 2020-07-12 DIAGNOSIS — Z79899 Other long term (current) drug therapy: Secondary | ICD-10-CM

## 2020-07-12 DIAGNOSIS — B9689 Other specified bacterial agents as the cause of diseases classified elsewhere: Secondary | ICD-10-CM | POA: Diagnosis not present

## 2020-07-12 DIAGNOSIS — Z7982 Long term (current) use of aspirin: Secondary | ICD-10-CM

## 2020-07-12 DIAGNOSIS — K209 Esophagitis, unspecified without bleeding: Secondary | ICD-10-CM | POA: Diagnosis not present

## 2020-07-12 DIAGNOSIS — K449 Diaphragmatic hernia without obstruction or gangrene: Secondary | ICD-10-CM | POA: Diagnosis present

## 2020-07-12 DIAGNOSIS — F419 Anxiety disorder, unspecified: Secondary | ICD-10-CM | POA: Diagnosis present

## 2020-07-12 DIAGNOSIS — E1151 Type 2 diabetes mellitus with diabetic peripheral angiopathy without gangrene: Secondary | ICD-10-CM | POA: Diagnosis present

## 2020-07-12 DIAGNOSIS — Z888 Allergy status to other drugs, medicaments and biological substances status: Secondary | ICD-10-CM

## 2020-07-12 DIAGNOSIS — Z90710 Acquired absence of both cervix and uterus: Secondary | ICD-10-CM | POA: Diagnosis not present

## 2020-07-12 DIAGNOSIS — Z9049 Acquired absence of other specified parts of digestive tract: Secondary | ICD-10-CM

## 2020-07-12 DIAGNOSIS — Z91048 Other nonmedicinal substance allergy status: Secondary | ICD-10-CM

## 2020-07-12 DIAGNOSIS — Z20822 Contact with and (suspected) exposure to covid-19: Secondary | ICD-10-CM | POA: Diagnosis present

## 2020-07-12 DIAGNOSIS — Z794 Long term (current) use of insulin: Secondary | ICD-10-CM

## 2020-07-12 DIAGNOSIS — Z7901 Long term (current) use of anticoagulants: Secondary | ICD-10-CM

## 2020-07-12 DIAGNOSIS — K2101 Gastro-esophageal reflux disease with esophagitis, with bleeding: Secondary | ICD-10-CM | POA: Diagnosis not present

## 2020-07-12 DIAGNOSIS — I4821 Permanent atrial fibrillation: Secondary | ICD-10-CM | POA: Diagnosis present

## 2020-07-12 DIAGNOSIS — I5021 Acute systolic (congestive) heart failure: Secondary | ICD-10-CM | POA: Diagnosis not present

## 2020-07-12 DIAGNOSIS — N183 Chronic kidney disease, stage 3 unspecified: Secondary | ICD-10-CM | POA: Diagnosis present

## 2020-07-12 DIAGNOSIS — I159 Secondary hypertension, unspecified: Secondary | ICD-10-CM | POA: Diagnosis not present

## 2020-07-12 DIAGNOSIS — K31819 Angiodysplasia of stomach and duodenum without bleeding: Secondary | ICD-10-CM | POA: Diagnosis not present

## 2020-07-12 DIAGNOSIS — E7800 Pure hypercholesterolemia, unspecified: Secondary | ICD-10-CM | POA: Diagnosis not present

## 2020-07-12 DIAGNOSIS — D62 Acute posthemorrhagic anemia: Secondary | ICD-10-CM | POA: Diagnosis present

## 2020-07-12 DIAGNOSIS — I10 Essential (primary) hypertension: Secondary | ICD-10-CM | POA: Diagnosis present

## 2020-07-12 DIAGNOSIS — E1142 Type 2 diabetes mellitus with diabetic polyneuropathy: Secondary | ICD-10-CM | POA: Diagnosis not present

## 2020-07-12 LAB — CBC
HCT: 27.6 % — ABNORMAL LOW (ref 36.0–46.0)
Hemoglobin: 8.2 g/dL — ABNORMAL LOW (ref 12.0–15.0)
MCH: 29 pg (ref 26.0–34.0)
MCHC: 29.7 g/dL — ABNORMAL LOW (ref 30.0–36.0)
MCV: 97.5 fL (ref 80.0–100.0)
Platelets: 356 10*3/uL (ref 150–400)
RBC: 2.83 MIL/uL — ABNORMAL LOW (ref 3.87–5.11)
RDW: 15.9 % — ABNORMAL HIGH (ref 11.5–15.5)
WBC: 13.5 10*3/uL — ABNORMAL HIGH (ref 4.0–10.5)
nRBC: 0 % (ref 0.0–0.2)

## 2020-07-12 LAB — BASIC METABOLIC PANEL
Anion gap: 10 (ref 5–15)
BUN: 27 mg/dL — ABNORMAL HIGH (ref 8–23)
CO2: 27 mmol/L (ref 22–32)
Calcium: 9 mg/dL (ref 8.9–10.3)
Chloride: 100 mmol/L (ref 98–111)
Creatinine, Ser: 1.14 mg/dL — ABNORMAL HIGH (ref 0.44–1.00)
GFR, Estimated: 49 mL/min — ABNORMAL LOW (ref >60)
Glucose, Bld: 122 mg/dL — ABNORMAL HIGH (ref 70–99)
Potassium: 4.4 mmol/L (ref 3.5–5.1)
Sodium: 137 mmol/L (ref 135–145)

## 2020-07-12 LAB — RESPIRATORY PANEL BY RT PCR (FLU A&B, COVID)
Influenza A by PCR: NEGATIVE
Influenza B by PCR: NEGATIVE
SARS Coronavirus 2 by RT PCR: NEGATIVE

## 2020-07-12 LAB — OCCULT BLOOD X 1 CARD TO LAB, STOOL: Fecal Occult Bld: POSITIVE — AB

## 2020-07-12 LAB — HEMOGLOBIN AND HEMATOCRIT, BLOOD
HCT: 24.4 % — ABNORMAL LOW (ref 36.0–46.0)
Hemoglobin: 7.3 g/dL — ABNORMAL LOW (ref 12.0–15.0)

## 2020-07-12 LAB — TROPONIN I (HIGH SENSITIVITY): Troponin I (High Sensitivity): 4 ng/L (ref <18)

## 2020-07-12 LAB — GLUCOSE, CAPILLARY: Glucose-Capillary: 216 mg/dL — ABNORMAL HIGH (ref 70–99)

## 2020-07-12 MED ORDER — TRIAMTERENE-HCTZ 37.5-25 MG PO TABS
2.0000 | ORAL_TABLET | Freq: Every day | ORAL | Status: DC
  Filled 2020-07-12: qty 2

## 2020-07-12 MED ORDER — PRAVASTATIN SODIUM 20 MG PO TABS
20.0000 mg | ORAL_TABLET | Freq: Every day | ORAL | Status: DC
  Administered 2020-07-13 – 2020-07-14 (×2): 20 mg via ORAL
  Filled 2020-07-12 (×2): qty 1

## 2020-07-12 MED ORDER — INSULIN GLARGINE 100 UNIT/ML SOLOSTAR PEN: 30.0000 [IU] | PEN_INJECTOR | Freq: Every day | SUBCUTANEOUS | Status: DC

## 2020-07-12 MED ORDER — SODIUM CHLORIDE 0.9 % IV SOLN
8.0000 mg/h | INTRAVENOUS | Status: DC
  Administered 2020-07-12 – 2020-07-15 (×6): 8 mg/h via INTRAVENOUS
  Filled 2020-07-12 (×12): qty 80

## 2020-07-12 MED ORDER — DOCUSATE SODIUM 100 MG PO CAPS
100.0000 mg | ORAL_CAPSULE | Freq: Two times a day (BID) | ORAL | Status: DC
  Administered 2020-07-12 – 2020-07-15 (×4): 100 mg via ORAL
  Filled 2020-07-12 (×5): qty 1

## 2020-07-12 MED ORDER — ALPRAZOLAM 0.25 MG PO TABS
0.2500 mg | ORAL_TABLET | Freq: Every day | ORAL | Status: DC
  Administered 2020-07-12 – 2020-07-14 (×3): 0.25 mg via ORAL
  Filled 2020-07-12 (×3): qty 1

## 2020-07-12 MED ORDER — ASPIRIN EC 81 MG PO TBEC
81.0000 mg | DELAYED_RELEASE_TABLET | Freq: Every day | ORAL | Status: DC
  Administered 2020-07-12 – 2020-07-13 (×2): 81 mg via ORAL
  Filled 2020-07-12 (×3): qty 1

## 2020-07-12 MED ORDER — PREDNISONE 20 MG PO TABS
60.0000 mg | ORAL_TABLET | Freq: Once | ORAL | Status: AC
  Administered 2020-07-12: 60 mg via ORAL
  Filled 2020-07-12: qty 3

## 2020-07-12 MED ORDER — INSULIN GLARGINE 100 UNIT/ML ~~LOC~~ SOLN
30.0000 [IU] | Freq: Every day | SUBCUTANEOUS | Status: DC
  Administered 2020-07-12 – 2020-07-14 (×3): 30 [IU] via SUBCUTANEOUS
  Filled 2020-07-12 (×5): qty 0.3

## 2020-07-12 MED ORDER — ALBUTEROL SULFATE HFA 108 (90 BASE) MCG/ACT IN AERS
2.0000 | INHALATION_SPRAY | Freq: Four times a day (QID) | RESPIRATORY_TRACT | Status: DC | PRN
  Filled 2020-07-12: qty 6.7

## 2020-07-12 MED ORDER — AMLODIPINE BESYLATE 10 MG PO TABS: 10.0000 mg | ORAL_TABLET | Freq: Every day | ORAL | Status: DC

## 2020-07-12 MED ORDER — ONDANSETRON HCL 4 MG PO TABS
4.0000 mg | ORAL_TABLET | Freq: Three times a day (TID) | ORAL | Status: DC | PRN
  Administered 2020-07-13: 4 mg via ORAL
  Filled 2020-07-12: qty 1

## 2020-07-12 MED ORDER — METOPROLOL TARTRATE 50 MG PO TABS
75.0000 mg | ORAL_TABLET | Freq: Every day | ORAL | Status: DC
  Administered 2020-07-12: 75 mg via ORAL
  Filled 2020-07-12: qty 3

## 2020-07-12 MED ORDER — LOSARTAN POTASSIUM 50 MG PO TABS
100.0000 mg | ORAL_TABLET | Freq: Every day | ORAL | Status: DC
  Administered 2020-07-12: 100 mg via ORAL
  Filled 2020-07-12: qty 4

## 2020-07-12 MED ORDER — METOPROLOL TARTRATE 50 MG PO TABS: 100.0000 mg | ORAL_TABLET | Freq: Every morning | ORAL | Status: DC

## 2020-07-12 MED ORDER — VENLAFAXINE HCL 37.5 MG PO TABS
37.5000 mg | ORAL_TABLET | Freq: Two times a day (BID) | ORAL | Status: DC
  Administered 2020-07-12 – 2020-07-15 (×3): 37.5 mg via ORAL
  Filled 2020-07-12 (×7): qty 1

## 2020-07-12 MED ORDER — ESOMEPRAZOLE MAGNESIUM 20 MG PO CPDR
40.0000 mg | DELAYED_RELEASE_CAPSULE | Freq: Two times a day (BID) | ORAL | Status: DC
  Filled 2020-07-12 (×2): qty 2

## 2020-07-12 MED ORDER — PANTOPRAZOLE SODIUM 40 MG PO TBEC: 40.0000 mg | DELAYED_RELEASE_TABLET | Freq: Two times a day (BID) | ORAL | Status: DC

## 2020-07-12 MED ORDER — SODIUM CHLORIDE 0.9 % IV SOLN: INTRAVENOUS | Status: DC

## 2020-07-12 MED ORDER — APIXABAN 5 MG PO TABS
5.0000 mg | ORAL_TABLET | Freq: Two times a day (BID) | ORAL | Status: DC
  Filled 2020-07-12: qty 1

## 2020-07-12 MED ORDER — INSULIN ASPART 100 UNIT/ML ~~LOC~~ SOLN
0.0000 [IU] | Freq: Three times a day (TID) | SUBCUTANEOUS | Status: DC
  Administered 2020-07-13 – 2020-07-15 (×6): 3 [IU] via SUBCUTANEOUS
  Administered 2020-07-15: 5 [IU] via SUBCUTANEOUS
  Filled 2020-07-12: qty 0.15

## 2020-07-12 MED ORDER — SODIUM CHLORIDE 0.9 % IV SOLN
80.0000 mg | Freq: Once | INTRAVENOUS | Status: AC
  Administered 2020-07-12: 23:00:00 80 mg via INTRAVENOUS
  Filled 2020-07-12: qty 80

## 2020-07-12 MED ORDER — NON FORMULARY: 40.0000 mg | Freq: Two times a day (BID) | Status: DC

## 2020-07-12 MED ORDER — ACETAMINOPHEN 500 MG PO TABS
1000.0000 mg | ORAL_TABLET | Freq: Three times a day (TID) | ORAL | Status: DC | PRN
  Administered 2020-07-13: 1000 mg via ORAL
  Filled 2020-07-12 (×2): qty 2

## 2020-07-12 MED ORDER — PANTOPRAZOLE SODIUM 40 MG IV SOLR: 40.0000 mg | Freq: Two times a day (BID) | INTRAVENOUS | Status: DC

## 2020-07-12 MED ORDER — ALBUTEROL SULFATE HFA 108 (90 BASE) MCG/ACT IN AERS
6.0000 | INHALATION_SPRAY | Freq: Once | RESPIRATORY_TRACT | Status: AC
  Administered 2020-07-12: 6 via RESPIRATORY_TRACT
  Filled 2020-07-12: qty 6.7

## 2020-07-12 MED ORDER — FUROSEMIDE 10 MG/ML IJ SOLN
20.0000 mg | Freq: Four times a day (QID) | INTRAMUSCULAR | Status: AC
  Administered 2020-07-12 – 2020-07-13 (×2): 20 mg via INTRAVENOUS
  Filled 2020-07-12 (×2): qty 2

## 2020-07-12 NOTE — ED Notes (Signed)
Walk pt to the restroom and she was ok on the way there bu on the way back pt complains of SOB. When we go back to the room she was still week but when I put her back on the monitor her O2 was 98-100%

## 2020-07-12 NOTE — ED Provider Notes (Signed)
[facility] Emergency Department Provider Note MRN:  [id number]  Arrival date & time: 07/12/20     Chief Complaint   Shortness of Breath   History of Present Illness   [name] is a 79 y.o. year-old female with a history of A. fib, diabetes presenting to the ED with chief complaint of shortness of breath.  Worsening shortness of breath over the past 1 or 2 days.  Underwent recent watchman procedure.  Feels like she is wheezing, feels like her asthma is flaring.  Feeling tightness in the chest.  Denies abdominal pain, no headache or vision change, no leg pain or swelling.  Symptoms moderate, constant, no other exacerbating or alleviating factors.  Review of Systems  A complete 10 system review of systems was obtained and all systems are negative except as noted in the HPI and PMH.   Patient's Health History    Past Medical History:  Diagnosis Date  . Acute GI bleeding 05/13/2015  . Anxiety   . Arthritis   . Asthma   . Atrial fibrillation ([place]) 10/29/2017  . Bakers cyst, left   . Chronic kidney disease    stage 3  . Constipation   . Diabetes mellitus    type 2  . Dysrhythmia   . GERD (gastroesophageal reflux disease)   . Heart murmur   . History of cellulitis   . History of dizziness   . History of hiatal hernia    tiny 06/15/2016  . Hypertension   . Iron deficiency anemia   . Peripheral vascular disease ([place])   . PONV (postoperative nausea and vomiting)   . Pulmonary nodule     Past Surgical History:  Procedure Laterality Date  . APPENDECTOMY  1952  . BIOPSY  10/16/2018   Procedure: BIOPSY;  Surgeon: Clarene Essex, MD;  [facility];  Service: Endoscopy;;  . BIOPSY  11/26/2019   Procedure: BIOPSY;  Surgeon: Clarene Essex, MD;  [facility];  Service: Endoscopy;;  . COLONOSCOPY W/ POLYPECTOMY  05/15/2013  . ESOPHAGOGASTRODUODENOSCOPY (EGD) WITH PROPOFOL N/A 06/15/2016   Procedure: ESOPHAGOGASTRODUODENOSCOPY (EGD) WITH  PROPOFOL;  Surgeon: Clarene Essex, MD;  [facility];  Service: Endoscopy;  Laterality: N/A;  . ESOPHAGOGASTRODUODENOSCOPY (EGD) WITH PROPOFOL N/A 10/16/2018   Procedure: ESOPHAGOGASTRODUODENOSCOPY (EGD) WITH PROPOFOL;  Surgeon: Clarene Essex, MD;  [facility];  Service: Endoscopy;  Laterality: N/A;  . ESOPHAGOGASTRODUODENOSCOPY (EGD) WITH PROPOFOL N/A 11/26/2019   Procedure: ESOPHAGOGASTRODUODENOSCOPY (EGD) WITH PROPOFOL;  Surgeon: Clarene Essex, MD;  [facility];  Service: Endoscopy;  Laterality: N/A;  . EYE SURGERY Bilateral    cataracts removed  . GI RADIOFREQUENCY ABLATION  10/16/2018   Procedure: GI RADIOFREQUENCY ABLATION;  Surgeon: Clarene Essex, MD;  [facility];  Service: Endoscopy;;  . GI RADIOFREQUENCY ABLATION N/A 11/26/2019   Procedure: GI RADIOFREQUENCY ABLATION;  Surgeon: Clarene Essex, MD;  [facility];  Service: Endoscopy;  Laterality: N/A;  . HAND SURGERY Left 2010   operated on index and ring finger  . INCONTINENCE SURGERY    . JOINT REPLACEMENT Right    knee  . KNEE ARTHROSCOPY  2003 2005   right knee  . KYPHOPLASTY N/A 01/29/2020   Procedure: T8 KYPHOPLASTY;  Surgeon: Melina Schools, MD;  Location: [place];  Service: Orthopedics;  Laterality: N/A;  60 mins Local with [facility]  . LEFT ATRIAL APPENDAGE OCCLUSION N/A 07/01/2020   Procedure: LEFT ATRIAL APPENDAGE OCCLUSION;  Surgeon: Vickie Epley, MD;  Location: [place]  [facility];  Service: Cardiovascular;  Laterality: N/A;  . mass fallopian tube    . SPINAL CORD STIMULATOR BATTERY EXCHANGE N/A 09/09/2015   Procedure: SPINAL CORD STIMULATOR BATTERY EXCHANGE;  Surgeon: Melina Schools, MD;  Location: [place];  Service: Orthopedics;  Laterality: N/A;  . TOTAL KNEE ARTHROPLASTY Left 11/05/2017   Procedure: LEFT TOTAL KNEE ARTHROPLASTY;  Surgeon: Paralee Cancel, MD;  [facility];  Service: Orthopedics;  Laterality: Left;  Adductor Block  . VAGINAL HYSTERECTOMY  1977   1 ovary  removed    Family History  Problem Relation Age of Onset  . Lung cancer Mother   . Heart attack Father   . Colon cancer Neg Hx   . Stroke Neg Hx   . Migraines Neg Hx     Social History   Socioeconomic History  . Marital status: Married    Spouse name: Charlotte Crumb  . Number of children: 2  . Years of education: 32  . Highest education level: Not on file  Occupational History    Comment: retired Pharmacist, hospital, IT sales professional  Tobacco Use  . Smoking status: Never Smoker  . Smokeless tobacco: Never Used  Vaping Use  . Vaping Use: Never used  Substance and Sexual Activity  . Alcohol use: No  . Drug use: No  . Sexual activity: Never    Birth control/protection: Surgical  Other Topics Concern  . Not on file  Social History Narrative   Lives with husband   Caffeine use- none   Social Determinants of Health   Financial Resource Strain:   . Difficulty of Paying Living Expenses: Not on file  Food Insecurity:   . Worried About Charity fundraiser in the Last Year: Not on file  . Ran Out of Food in the Last Year: Not on file  Transportation Needs:   . Lack of Transportation (Medical): Not on file  . Lack of Transportation (Non-Medical): Not on file  Physical Activity:   . Days of Exercise per Week: Not on file  . Minutes of Exercise per Session: Not on file  Stress:   . Feeling of Stress : Not on file  Social Connections:   . Frequency of Communication with Friends and Family: Not on file  . Frequency of Social Gatherings with Friends and Family: Not on file  . Attends Religious Services: Not on file  . Active Member of Clubs or Organizations: Not on file  . Attends Archivist Meetings: Not on file  . Marital Status: Not on file  Intimate Partner Violence:   . Fear of Current or Ex-Partner: Not on file  . Emotionally Abused: Not on file  . Physically Abused: Not on file  . Sexually Abused: Not on file     Physical Exam   Vitals:   07/12/20 1715 07/12/20 1730   BP: (!) 144/83 (!) 145/63  Pulse: 82 85  Resp: 14 17  Temp:    SpO2: 99% 99%    CONSTITUTIONAL: Well-appearing, NAD NEURO:  Alert and oriented x 3, no focal deficits EYES:  eyes equal and reactive ENT/NECK:  no LAD, no JVD CARDIO: Regular rate, well-perfused, normal S1 and S2 PULM:  CTAB no wheezing or rhonchi GI/GU:  normal bowel sounds, non-distended, non-tender MSK/SPINE:  No gross deformities, no edema SKIN:  no rash, atraumatic PSYCH:  Appropriate speech and behavior  *Additional and/or pertinent findings included in MDM below  Diagnostic and Interventional Summary    EKG Interpretation  Date/Time:  Monday July 12 2020 13:36:56 EST Ventricular Rate:  80 PR Interval:    QRS Duration: 85 QT Interval:  379 QTC Calculation: 438 R Axis:   80 Text Interpretation: Sinus rhythm 12 Lead; [place] Confirmed by Gerlene Fee [phone]) on 07/12/2020 4:19:41 PM      Labs Reviewed  CBC - Abnormal; Notable for the following components:      Result Value   WBC 13.5 (*)    RBC 2.83 (*)    Hemoglobin 8.2 (*)    HCT 27.6 (*)    MCHC 29.7 (*)    RDW 15.9 (*)    All other components within normal limits  BASIC METABOLIC PANEL - Abnormal; Notable for the following components:   Glucose, Bld 122 (*)    BUN 27 (*)    Creatinine, Ser 1.14 (*)    GFR, Estimated 49 (*)    All other components within normal limits  OCCULT BLOOD X 1 CARD TO LAB, STOOL - Abnormal; Notable for the following components:   Fecal Occult Bld POSITIVE (*)    All other components within normal limits  RESPIRATORY PANEL BY RT PCR (FLU A&B, COVID)  TROPONIN I (HIGH SENSITIVITY)    DG Chest 2 View  Final Result      Medications  predniSONE (DELTASONE) tablet 60 mg (60 mg Oral Given 07/12/20 1701)  albuterol (VENTOLIN HFA) 108 (90 Base) MCG/ACT inhaler 6 puff (6 puffs Inhalation Given 07/12/20 1703)     Procedures  /  Critical Care Procedures  ED Course and Medical Decision Making  I have  reviewed the triage vital signs, the nursing notes, and pertinent available records from the EMR.  Listed above are laboratory and imaging tests that I personally ordered, reviewed, and interpreted and then considered in my medical decision making (see below for details).  Clinically with the wheezing this seems more consistent with an asthma exacerbation, however she has had a recent procedure and possibly has been taken off of her anticoagulation.  This would raise the concern for possible pulmonary embolism.  Awaiting labs and will reassess after albuterol.  Will consider CTA PE study.     Patient doing much better after albuterol and prednisone.  On reassessment still some mild wheezing but this seems to be coming from the upper airways as her lungs are clear.  Unsure if recent intubation has caused some vocal cord dysfunction.  She is breathing much more comfortably and there are no airway concerns at this time.  Her hemoglobin has dropped over 2 points over the past week or so.  She is Hemoccult positive she is anticoagulated.  Will admit to medicine.  Barth Kirks. Sedonia Small, [place] [email or web address]  Final Clinical Impressions(s) / ED Diagnoses     ICD-10-CM   1. Gastrointestinal hemorrhage, unspecified gastrointestinal hemorrhage type  K92.2   2. Shortness of breath  R06.02     ED Discharge Orders    None       Discharge Instructions Discussed with and Provided to Patient:   Discharge Instructions   None       Maudie Flakes, MD 07/12/20 [id number]

## 2020-07-12 NOTE — H&P (Signed)
History and Physical    [name] VOZ:[id number] DOB: [birth year] DOA: 07/12/2020  PCP: Leighton Ruff, MD (Confirm with patient/family/NH records and if not entered, this has to be entered at [facility] point of entry) Patient coming from: home  I have personally briefly reviewed patient's old medical records in [place]  Chief Complaint: SOB  HPI: [name] is a 79 y.o. female with medical history significant of PAF, h/o GI bleeds, remote h/o asthma - with no flares or need for medication for several years, DM2, GERD, HTN. Being at high risk for GI bleeding she underwent Watchman procedure (LAA occlusive device placement) in order to come off chronic anti-coagulation, done 11/4. Over the past several days she has had increased SOB/DOE, chest tightness and wheezing. Due to her symptoms she presents to [facility] for evaluation. She denies Abdominal pain, chest pain, denies any frank bleeding, cough.   ED Course: T 98.2  148/55, HR 87  RR 21. ED-MD reported upper airway wheezing with her lungs being clear. She was given an albuterol MDI treatment and 60 mg prednisone with improvement in her symptoms. Lab: Covid NEGATIVE, WBC 13.5, Hgb 8.2, Stool heme positive, glucose 122, Cr 1.14, Troponin 4. CXR - NAD. TRH called to admit for further evaluation and treatment.   Review of Systems: As per HPI otherwise 10 point review of systems negative.    Past Medical History:  Diagnosis Date  . Acute GI bleeding 05/13/2015  . Anxiety   . Arthritis   . Asthma   . Atrial fibrillation ([place]) 10/29/2017  . Bakers cyst, left   . Chronic kidney disease    stage 3  . Constipation   . Diabetes mellitus    type 2  . Dysrhythmia   . GERD (gastroesophageal reflux disease)   . Heart murmur   . History of cellulitis   . History of dizziness   . History of hiatal hernia    tiny 06/15/2016  . Hypertension   . Iron deficiency anemia   . Peripheral vascular disease ([place])   . PONV (postoperative  nausea and vomiting)   . Pulmonary nodule     Past Surgical History:  Procedure Laterality Date  . APPENDECTOMY  1952  . BIOPSY  10/16/2018   Procedure: BIOPSY;  Surgeon: Clarene Essex, MD;  [facility];  Service: Endoscopy;;  . BIOPSY  11/26/2019   Procedure: BIOPSY;  Surgeon: Clarene Essex, MD;  [facility];  Service: Endoscopy;;  . COLONOSCOPY W/ POLYPECTOMY  05/15/2013  . ESOPHAGOGASTRODUODENOSCOPY (EGD) WITH PROPOFOL N/A 06/15/2016   Procedure: ESOPHAGOGASTRODUODENOSCOPY (EGD) WITH PROPOFOL;  Surgeon: Clarene Essex, MD;  [facility];  Service: Endoscopy;  Laterality: N/A;  . ESOPHAGOGASTRODUODENOSCOPY (EGD) WITH PROPOFOL N/A 10/16/2018   Procedure: ESOPHAGOGASTRODUODENOSCOPY (EGD) WITH PROPOFOL;  Surgeon: Clarene Essex, MD;  [facility];  Service: Endoscopy;  Laterality: N/A;  . ESOPHAGOGASTRODUODENOSCOPY (EGD) WITH PROPOFOL N/A 11/26/2019   Procedure: ESOPHAGOGASTRODUODENOSCOPY (EGD) WITH PROPOFOL;  Surgeon: Clarene Essex, MD;  [facility];  Service: Endoscopy;  Laterality: N/A;  . EYE SURGERY Bilateral    cataracts removed  . GI RADIOFREQUENCY ABLATION  10/16/2018   Procedure: GI RADIOFREQUENCY ABLATION;  Surgeon: Clarene Essex, MD;  [facility];  Service: Endoscopy;;  . GI RADIOFREQUENCY ABLATION N/A 11/26/2019   Procedure: GI RADIOFREQUENCY ABLATION;  Surgeon: Clarene Essex, MD;  [facility];  Service: Endoscopy;  Laterality: N/A;  . HAND SURGERY Left 2010   operated on index and ring finger  .  INCONTINENCE SURGERY    . JOINT REPLACEMENT Right    knee  . KNEE ARTHROSCOPY  2003 2005   right knee  . KYPHOPLASTY N/A 01/29/2020   Procedure: T8 KYPHOPLASTY;  Surgeon: Melina Schools, MD;  Location: [place];  Service: Orthopedics;  Laterality: N/A;  60 mins Local with [facility]  . LEFT ATRIAL APPENDAGE OCCLUSION N/A 07/01/2020   Procedure: LEFT ATRIAL APPENDAGE OCCLUSION;  Surgeon: Vickie Epley, MD;  Location: [place];  Service: Cardiovascular;  Laterality: N/A;  . mass fallopian tube    . SPINAL CORD STIMULATOR BATTERY EXCHANGE N/A 09/09/2015   Procedure: SPINAL CORD STIMULATOR BATTERY EXCHANGE;  Surgeon: Melina Schools, MD;  Location: [place];  Service: Orthopedics;  Laterality: N/A;  . TOTAL KNEE ARTHROPLASTY Left 11/05/2017   Procedure: LEFT TOTAL KNEE ARTHROPLASTY;  Surgeon: Paralee Cancel, MD;  [facility];  Service: Orthopedics;  Laterality: Left;  Adductor Block  . VAGINAL HYSTERECTOMY  1977   1 ovary removed    Soc Hx - married 35 years. She has one son, one daughter, 3 grandsons, 1 Geographical information systems officer. She and her husband live independently. 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    reports that she has never smoked. She has never used smokeless tobacco. She reports that she does not drink alcohol and does not use drugs.  Allergies  Allergen Reactions  . Vasotec Shortness Of Breath and Other (See Comments)    wheezing  . Atorvastatin Other (See Comments)    Leg cramps   . Codeine Nausea And Vomiting  . Cymbalta [Duloxetine Hcl] Itching  . Lansoprazole Itching, Swelling, Rash and Other (See Comments)    Redness, swelling of mouth   . Morphine And Related Itching  . Sulfate Itching  . Tramadol Itching  . Adhesive [Tape] Rash  . Scopolamine Rash    Family History  Problem Relation Age of Onset  . Lung cancer Mother   . Heart attack Father   . Colon cancer Neg Hx   . Stroke Neg Hx   . Migraines Neg Hx      Prior to Admission medications   Medication Sig Start Date End Date Taking? Authorizing Provider  acetaminophen (TYLENOL) 500 MG tablet Take 1,000 mg by mouth every 8 (eight) hours as needed for moderate pain.   Yes [provider]  albuterol (PROAIR HFA) 108 (90 BASE) MCG/ACT inhaler Inhale 2 puffs into the lungs every 4 (four) hours as needed for wheezing or shortness of breath. 02/02/15  Yes Collene Gobble, MD   ALPRAZolam Duanne Moron) 0.25 MG tablet Take 1 tablet by mouth at bedtime.  03/25/20  Yes [provider]  amLODipine (NORVASC) 10 MG tablet Take 10 mg by mouth daily.   Yes [provider]  aspirin EC 81 MG tablet Take 81 mg by mouth daily. Swallow whole.   Yes [provider]  diphenhydrAMINE (BENADRYL) 25 MG tablet Take 25 mg by mouth daily as needed for allergies.   Yes [provider]  docusate sodium (COLACE) 100 MG capsule Take 100 mg by mouth 2 (two) times daily as needed for mild constipation.    Yes [provider]  ELIQUIS 5 MG TABS tablet TAKE 1 TABLET TWICE DAILY Patient taking differently: Take 5 mg by mouth 2 (two) times daily.  12/30/19  Yes Jerline Pain, MD  esomeprazole (NEXIUM) 40 MG capsule Take 40 mg by mouth in the morning  and at bedtime. Morning & afternoon. 04/23/15  Yes [provider]  fluticasone (FLONASE) 50 MCG/ACT nasal spray Place 1 spray into both nostrils daily as needed for allergies.    Yes [provider]  gabapentin (NEURONTIN) 300 MG capsule Take 600 mg by mouth in the morning, at noon, and at bedtime.    Yes [provider]  Insulin Glargine (LANTUS SOLOSTAR) 100 UNIT/ML Solostar Pen Inject 30 Units into the skin at bedtime.    Yes [provider]  Iron-FA-B Cmp-C-Biot-Probiotic (FUSION PLUS) CAPS Take 1 capsule by mouth every evening.  11/19/19  Yes [provider]  losartan (COZAAR) 100 MG tablet Take 100 mg by mouth daily.   Yes [provider]  metoprolol (LOPRESSOR) 50 MG tablet Take 75-100 mg by mouth See admin instructions. Take 2 tablets (100 mg) by mouth in the morning & take 1.5 tablets (75 mg) by mouth in the evening.   Yes [provider]  Multiple Vitamins-Minerals (PRESERVISION AREDS 2 PO) Take 1 capsule by mouth 2 (two) times daily.    Yes [provider]  NOVOLOG FLEXPEN 100 UNIT/ML FlexPen Inject 0-16 Units into the skin in the morning,  at noon, and at bedtime. Sliding scale insulin 09/04/19  Yes [provider]  ondansetron (ZOFRAN) 4 MG tablet Take 4 mg by mouth every 8 (eight) hours as needed for nausea or vomiting.   Yes [provider]  Pitavastatin Calcium (LIVALO) 1 MG TABS Take 1 mg by mouth at bedtime.   Yes [provider]  triamterene-hydrochlorothiazide (MAXZIDE-25) 37.5-25 MG tablet Take 2 capsules by mouth daily.    Yes [provider]  venlafaxine (EFFEXOR) 37.5 MG tablet Take 37.5 mg by mouth 2 (two) times daily. 10/27/19  Yes [provider]  DROPLET PEN NEEDLES 32G X 4 MM MISC  04/19/20   [provider]    Physical Exam: Vitals:   07/12/20 1830 07/12/20 1915 07/12/20 2000 07/12/20 2106  BP: (!) 148/55 (!) 136/50 (!) 146/54 (!) 159/62  Pulse: 87 89 95 87  Resp: (!) 21 (!) 24 (!) 22   Temp:      TempSrc:      SpO2: 93% 92% 92%      Vitals:   07/12/20 1830 07/12/20 1915 07/12/20 2000 07/12/20 2106  BP: (!) 148/55 (!) 136/50 (!) 146/54 (!) 159/62  Pulse: 87 89 95 87  Resp: (!) 21 (!) 24 (!) 22   Temp:      TempSrc:      SpO2: 93% 92% 92%    General: overweight woman in no distress Eyes: PERRL, lids and conjunctivae normal ENMT: Mucous membranes are moist. Posterior pharynx clear of any exudate or lesions.Normal dentition.  Neck: normal, supple, no masses, no thyromegaly Respiratory Normal respiratory effort. No accessory muscle use. Bibasilar rales, no wheezing Cardiovascular: Regular rate and rhythm, no murmurs / rubs / gallops. No JVD. 2+ distal LE edema with pitting,  2+ pedal pulses. No carotid bruits.  Abdomen: obese,  no tenderness, no masses palpated. No hepatosplenomegaly. Bowel sounds positive.  Musculoskeletal: no clubbing / cyanosis. No joint deformity upper and lower extremities. Good ROM, no contractures. Normal muscle tone.  Skin: no rashes, lesions, ulcers. No induration Neurologic: CN 2-12 grossly intact.  Strength 5/5 in all 4.   Psychiatric: Normal judgment and insight. Alert and oriented x 3. Normal mood.      Labs on Admission: I have personally reviewed following labs and imaging studies  CBC: Recent  Labs  Lab 07/12/20 1626  WBC 13.5*  HGB 8.2*  HCT 27.6*  MCV 97.5  PLT 681   Basic Metabolic Panel: Recent Labs  Lab 07/12/20 1626  NA 137  K 4.4  CL 100  CO2 27  GLUCOSE 122*  BUN 27*  CREATININE 1.14*  CALCIUM 9.0   GFR: Estimated Creatinine Clearance: 38.5 mL/min (A) (by C-G formula based on SCr of 1.14 mg/dL (H)). Liver Function Tests: No results for input(s): AST, ALT, ALKPHOS, BILITOT, PROT, ALBUMIN in the last 168 hours. No results for input(s): LIPASE, AMYLASE in the last 168 hours. No results for input(s): AMMONIA in the last 168 hours. Coagulation Profile: No results for input(s): INR, PROTIME in the last 168 hours. Cardiac Enzymes: No results for input(s): CKTOTAL, CKMB, CKMBINDEX, TROPONINI in the last 168 hours. BNP (last 3 results) No results for input(s): PROBNP in the last 8760 hours. HbA1C: No results for input(s): HGBA1C in the last 72 hours. CBG: No results for input(s): GLUCAP in the last 168 hours. Lipid Profile: No results for input(s): CHOL, HDL, LDLCALC, TRIG, CHOLHDL, LDLDIRECT in the last 72 hours. Thyroid Function Tests: No results for input(s): TSH, T4TOTAL, FREET4, T3FREE, THYROIDAB in the last 72 hours. Anemia Panel: No results for input(s): VITAMINB12, FOLATE, FERRITIN, TIBC, IRON, RETICCTPCT in the last 72 hours. Urine analysis:    Component Value Date/Time   COLORURINE YELLOW 01/27/2020 1155   APPEARANCEUR CLEAR 01/27/2020 1155   LABSPEC 1.014 01/27/2020 1155   PHURINE 5.0 01/27/2020 1155   GLUCOSEU NEGATIVE 01/27/2020 1155   HGBUR NEGATIVE 01/27/2020 1155   [place] 01/27/2020 1155   [place] 01/27/2020 1155   PROTEINUR NEGATIVE 01/27/2020 1155   UROBILINOGEN 0.2 03/20/2011 2257   NITRITE NEGATIVE 01/27/2020 1155    LEUKOCYTESUR NEGATIVE 01/27/2020 1155    Radiological Exams on Admission: DG Chest 2 View  Result Date: 07/12/2020 CLINICAL DATA:  Shortness of breath EXAM: CHEST - 2 VIEW COMPARISON:  01/27/2020 FINDINGS: The heart size and mediastinal contours are within normal limits. No focal airspace consolidation, pleural effusion, or pneumothorax. Interval cement augmentation of the T8 vertebral body. Unchanged alignment of thoracic spinal stimulator leads. IMPRESSION: No active cardiopulmonary disease. Electronically Signed   By: Davina Poke D.O.   On: 07/12/2020 14:32    EKG: Independently reviewed. Sinus rhythm.  Assessment/Plan Active Problems:   Asthma, chronic   Acute GI bleeding   SOB (shortness of breath)   HTN (hypertension)   DM type 2 with diabetic peripheral neuropathy (HCC)   Acute blood loss anemia   Persistent atrial fibrillation (HCC)   ABLA (acute blood loss anemia)    1. Cardiac - patient with HTN, no h/o CHF or pulmonary edema. She is 11 days post-Watchman procedure. She presents SOB. On exam she has bibasilar rales and LE edema suggestive of heart failure. Concern for pulmonary edema related to change in systolic function vs pericardial effusion, although no rub heard.  Plan Furosemide 20 IV x 2 doses  Repeat 2 D echo  Continue anti-coagulation as Watchman device not yet protective.  Cardiology consult in AM  2. Acute GI bleeding/acute blood loss anemia. Baseline Hgb 12.6 June '21, 10.8 g Nov 2, 8.2 g at admission. Heme positive stools. No frank bleeding: no melena, hematochezia, hematemesis. Hemodynamically stable.  H/o UGI bleeds in past. Plan protonix infusion  H/H q 6 , for significant drop will d/c anticoagulation   Type and screen  NPO except ice chips and sips with meds  GI consult in AM - spoke with Dr. Michail Sermon - Dr. Cristina Gong to see in AM  3. Asthma - patient w/ h/o asthma but no use of medication or flares for years. Her admitting exam by ED-MD with upper  airway wheezing (cardiac?) with clear lungs.  Plan Albuterol MDI q 6 prn.   No further steroids  4. DM - Last A1C 07/01/20 5.7%. Plan Continue basal insulin  Sliding scale coverage.  5. Code status - discussed with patient and spouse: full code.   5. HTN - continue home meds  DVT prophylaxis: eliquis  Code Status: full code  Family Communication: Husband present during interview and exam. Understands the Dx and the "grey zone" in regard to possible GI bleed and need to continue anticoagulation. Agrees to tx plan, as does patient.   Disposition Plan: home when medically stable  Consults called: GI- [facility]. Spoke with Dr. Michail Sermon. Cardiology - spoke with on-call Fellow.  Admission status: inpatient/tele   Adella Hare MD [facility] Pager [phone]  If 7PM-7AM, please contact night-coverage [email or web address] Password [facility]  07/12/2020, 9:21 PM

## 2020-07-13 ENCOUNTER — Inpatient Hospital Stay (HOSPITAL_COMMUNITY): Payer: Medicare PPO

## 2020-07-13 DIAGNOSIS — I5021 Acute systolic (congestive) heart failure: Secondary | ICD-10-CM | POA: Diagnosis not present

## 2020-07-13 DIAGNOSIS — I480 Paroxysmal atrial fibrillation: Secondary | ICD-10-CM | POA: Diagnosis not present

## 2020-07-13 DIAGNOSIS — K922 Gastrointestinal hemorrhage, unspecified: Secondary | ICD-10-CM

## 2020-07-13 DIAGNOSIS — D62 Acute posthemorrhagic anemia: Secondary | ICD-10-CM | POA: Diagnosis not present

## 2020-07-13 LAB — BASIC METABOLIC PANEL
Anion gap: 11 (ref 5–15)
BUN: 27 mg/dL — ABNORMAL HIGH (ref 8–23)
CO2: 26 mmol/L (ref 22–32)
Calcium: 8.8 mg/dL — ABNORMAL LOW (ref 8.9–10.3)
Chloride: 100 mmol/L (ref 98–111)
Creatinine, Ser: 1.02 mg/dL — ABNORMAL HIGH (ref 0.44–1.00)
GFR, Estimated: 56 mL/min — ABNORMAL LOW (ref >60)
Glucose, Bld: 253 mg/dL — ABNORMAL HIGH (ref 70–99)
Potassium: 4.9 mmol/L (ref 3.5–5.1)
Sodium: 137 mmol/L (ref 135–145)

## 2020-07-13 LAB — ECHOCARDIOGRAM COMPLETE
Height: 67 in
S' Lateral: 2.9 cm
Weight: 2518.54 oz

## 2020-07-13 LAB — PREPARE RBC (CROSSMATCH)

## 2020-07-13 LAB — HEMOGLOBIN AND HEMATOCRIT, BLOOD
HCT: 23.8 % — ABNORMAL LOW (ref 36.0–46.0)
HCT: 30.7 % — ABNORMAL LOW (ref 36.0–46.0)
Hemoglobin: 7 g/dL — ABNORMAL LOW (ref 12.0–15.0)
Hemoglobin: 9.6 g/dL — ABNORMAL LOW (ref 12.0–15.0)

## 2020-07-13 LAB — GLUCOSE, CAPILLARY
Glucose-Capillary: 197 mg/dL — ABNORMAL HIGH (ref 70–99)
Glucose-Capillary: 152 mg/dL — ABNORMAL HIGH (ref 70–99)
Glucose-Capillary: 167 mg/dL — ABNORMAL HIGH (ref 70–99)
Glucose-Capillary: 151 mg/dL — ABNORMAL HIGH (ref 70–99)

## 2020-07-13 MED ORDER — METOPROLOL TARTRATE 25 MG PO TABS
12.5000 mg | ORAL_TABLET | Freq: Once | ORAL | Status: AC
  Administered 2020-07-13: 12.5 mg via ORAL
  Filled 2020-07-13: qty 1

## 2020-07-13 MED ORDER — METHOCARBAMOL 500 MG PO TABS
500.0000 mg | ORAL_TABLET | Freq: Three times a day (TID) | ORAL | Status: DC | PRN
  Administered 2020-07-13: 500 mg via ORAL
  Filled 2020-07-13 (×2): qty 1

## 2020-07-13 MED ORDER — SODIUM CHLORIDE 0.9% IV SOLUTION: Freq: Once | INTRAVENOUS | Status: DC

## 2020-07-13 MED ORDER — ONDANSETRON HCL 4 MG/2ML IJ SOLN: 4.0000 mg | Freq: Four times a day (QID) | INTRAMUSCULAR | Status: DC | PRN

## 2020-07-13 MED ORDER — FUROSEMIDE 10 MG/ML IJ SOLN
40.0000 mg | Freq: Once | INTRAMUSCULAR | Status: AC
  Administered 2020-07-13: 40 mg via INTRAVENOUS
  Filled 2020-07-13: qty 4

## 2020-07-13 MED ORDER — METOPROLOL TARTRATE 25 MG PO TABS
25.0000 mg | ORAL_TABLET | Freq: Two times a day (BID) | ORAL | Status: DC
  Administered 2020-07-13: 25 mg via ORAL
  Filled 2020-07-13: qty 1

## 2020-07-13 MED ORDER — METOPROLOL TARTRATE 5 MG/5ML IV SOLN
5.0000 mg | INTRAVENOUS | Status: AC | PRN
  Administered 2020-07-13: 5 mg via INTRAVENOUS
  Filled 2020-07-13: qty 5

## 2020-07-13 MED ORDER — ONDANSETRON 4 MG PO TBDP: 4.0000 mg | ORAL_TABLET | Freq: Three times a day (TID) | ORAL | Status: DC | PRN

## 2020-07-13 MED ORDER — ONDANSETRON HCL 4 MG/2ML IJ SOLN
4.0000 mg | Freq: Three times a day (TID) | INTRAMUSCULAR | Status: DC | PRN
  Administered 2020-07-13 – 2020-07-15 (×4): 4 mg via INTRAVENOUS
  Filled 2020-07-13 (×5): qty 2

## 2020-07-13 MED ORDER — METOPROLOL TARTRATE 25 MG PO TABS
12.5000 mg | ORAL_TABLET | Freq: Every morning | ORAL | Status: DC
  Administered 2020-07-13: 12.5 mg via ORAL
  Filled 2020-07-13: qty 1

## 2020-07-13 NOTE — Progress Notes (Signed)
Notified MD via Amion of pts hemoglobin of 7.3. Pts Eliquis discontinued. No additional orders given at this time. Will continue to monitor.

## 2020-07-13 NOTE — Consult Note (Signed)
Cardiology Consultation:   Patient ID: [name]; [id number]; 11-Oct-1940   Admit date: 07/12/2020 Date of Consult: 07/13/2020  Primary Care Provider: Leighton Ruff, MD Primary Cardiologist: Candee Furbish, MD 05/25/2020 Primary Electrophysiologist:  Vickie Epley, MD 09/07/2019   Patient Profile:   [name] is a 79 y.o. female with a hx of PAF, DM, HTN, recurrent GIB 2016, GERD, OA, asthma, who is being seen today for the evaluation of GIB post-Watchman at the request of Dr Nevada Crane.  History of Present Illness:   Nancy. [name] was evaluated by Dr Quentin Ore. She would benefit from a Watchman device because of her history of recurrent GIB requiring argon Laser treatment. She had a Watchman LAA occlusion on 11/04. She tolerated the procedure well.   Post-procedure, she had no bleeding issues and was in SR. She developed mild pericarditis symptoms and was started on colchicine.   She was seen back in the office on 11/10. Colchicine caused GI upset and she had stopped it. She was still on ASA 81 mg qd and Eliquis 5 mg bid.  She came to the ER on 11/16 with worsening SOB, started on albuterol and prednisone for asthma flare, with possibly a component of CHF as well. Her H&H were lower than previous, concern for bleeding. Heme+ stools. Started on IV Protonix, GI to see.   Today, HGB 7.3, Eliquis d/c'd and cards asked to see.    [name] was initially doing well. However, last Friday, she started feeling bad.  She was extremely weak.  She had significant dyspnea on exertion.  She did not have PND or lower extremity edema, but thinks she had some orthopnea.  She had gained a couple of pounds, and knew that it must be fluid because she was eating so poorly.  It was not until right before she came to the hospital that her stools had any changes at all, it is always difficult for her to tell when she is bleeding because of the iron she takes.  Finally, the symptoms got so bad she came  to the hospital.  She has had IV Lasix and a unit of blood.  She is breathing some better, but is still weak as water.  She also had problems with leg cramps overnight.   Past Medical History:  Diagnosis Date  . Acute GI bleeding 05/13/2015  . Anxiety   . Arthritis   . Asthma   . Atrial fibrillation ([place]) 10/29/2017  . Bakers cyst, left   . Chronic kidney disease    stage 3  . Constipation   . Diabetes mellitus    type 2  . Dysrhythmia   . GERD (gastroesophageal reflux disease)   . Heart murmur   . History of cellulitis   . History of dizziness   . History of hiatal hernia    tiny 06/15/2016  . Hypertension   . Iron deficiency anemia   . Peripheral vascular disease ([place])   . PONV (postoperative nausea and vomiting)   . Pulmonary nodule     Past Surgical History:  Procedure Laterality Date  . APPENDECTOMY  1952  . BIOPSY  10/16/2018   Procedure: BIOPSY;  Surgeon: Clarene Essex, MD;  Location: [facility];  Service: Endoscopy;;  . BIOPSY  11/26/2019   Procedure: BIOPSY;  Surgeon: Clarene Essex, MD;  Location: [facility];  Service: Endoscopy;;  . COLONOSCOPY W/ POLYPECTOMY  05/15/2013  . ESOPHAGOGASTRODUODENOSCOPY (EGD) WITH PROPOFOL N/A 06/15/2016   Procedure: ESOPHAGOGASTRODUODENOSCOPY (EGD) WITH PROPOFOL;  Surgeon: Clarene Essex, MD;  Location: [facility];  Service: Endoscopy;  Laterality: N/A;  . ESOPHAGOGASTRODUODENOSCOPY (EGD) WITH PROPOFOL N/A 10/16/2018   Procedure: ESOPHAGOGASTRODUODENOSCOPY (EGD) WITH PROPOFOL;  Surgeon: Clarene Essex, MD;  [facility]: [facility];  Service: Endoscopy;  Laterality: N/A;  . ESOPHAGOGASTRODUODENOSCOPY (EGD) WITH PROPOFOL N/A 11/26/2019   Procedure: ESOPHAGOGASTRODUODENOSCOPY (EGD) WITH PROPOFOL;  Surgeon: Clarene Essex, MD;  Location: [facility];  Service: Endoscopy;  Laterality: N/A;  . EYE SURGERY Bilateral    cataracts removed  . GI RADIOFREQUENCY ABLATION  10/16/2018   Procedure: GI RADIOFREQUENCY ABLATION;  Surgeon: Clarene Essex, MD;   Location: [facility];  Service: Endoscopy;;  . GI RADIOFREQUENCY ABLATION N/A 11/26/2019   Procedure: GI RADIOFREQUENCY ABLATION;  Surgeon: Clarene Essex, MD;  Location: [facility];  Service: Endoscopy;  Laterality: N/A;  . HAND SURGERY Left 2010   operated on index and ring finger  . INCONTINENCE SURGERY    . JOINT REPLACEMENT Right    knee  . KNEE ARTHROSCOPY  2003 2005   right knee  . KYPHOPLASTY N/A 01/29/2020   Procedure: T8 KYPHOPLASTY;  Surgeon: Melina Schools, MD;  Location: [facility];  Service: Orthopedics;  Laterality: N/A;  60 mins Local with [facility]  . LEFT ATRIAL APPENDAGE OCCLUSION N/A 07/01/2020   Procedure: LEFT ATRIAL APPENDAGE OCCLUSION;  Surgeon: Vickie Epley, MD;  Location: [facility];  Service: Cardiovascular;  Laterality: N/A;  . mass fallopian tube    . SPINAL CORD STIMULATOR BATTERY EXCHANGE N/A 09/09/2015   Procedure: SPINAL CORD STIMULATOR BATTERY EXCHANGE;  Surgeon: Melina Schools, MD;  Location: [facility];  Service: Orthopedics;  Laterality: N/A;  . TOTAL KNEE ARTHROPLASTY Left 11/05/2017   Procedure: LEFT TOTAL KNEE ARTHROPLASTY;  Surgeon: Paralee Cancel, MD;  Location: [facility];  Service: Orthopedics;  Laterality: Left;  Adductor Block  . VAGINAL HYSTERECTOMY  1977   1 ovary removed     Prior to Admission medications   Medication Sig Start Date End Date Taking? Authorizing Provider  acetaminophen (TYLENOL) 500 MG tablet Take 1,000 mg by mouth every 8 (eight) hours as needed for moderate pain.   Yes [provider]  albuterol (PROAIR HFA) 108 (90 BASE) MCG/ACT inhaler Inhale 2 puffs into the lungs every 4 (four) hours as needed for wheezing or shortness of breath. 02/02/15  Yes Collene Gobble, MD  ALPRAZolam Duanne Moron) 0.25 MG tablet Take 1 tablet by mouth at bedtime.  03/25/20  Yes [provider]  amLODipine (NORVASC) 10 MG tablet Take 10 mg by mouth daily.   Yes [provider]  aspirin EC 81 MG tablet Take 81 mg by mouth daily.  Swallow whole.   Yes [provider]  diphenhydrAMINE (BENADRYL) 25 MG tablet Take 25 mg by mouth daily as needed for allergies.   Yes [provider]  docusate sodium (COLACE) 100 MG capsule Take 100 mg by mouth 2 (two) times daily as needed for mild constipation.    Yes [provider]  ELIQUIS 5 MG TABS tablet TAKE 1 TABLET TWICE DAILY Patient taking differently: Take 5 mg by mouth 2 (two) times daily.  12/30/19  Yes Jerline Pain, MD  esomeprazole (NEXIUM) 40 MG capsule Take 40 mg by mouth in the morning and at bedtime. Morning & afternoon. 04/23/15  Yes [provider]  fluticasone (FLONASE) 50 MCG/ACT nasal spray Place 1 spray into both nostrils daily as needed for allergies.    Yes [provider]  gabapentin (NEURONTIN) 300 MG  capsule Take 600 mg by mouth in the morning, at noon, and at bedtime.    Yes [provider]  Insulin Glargine (LANTUS SOLOSTAR) 100 UNIT/ML Solostar Pen Inject 30 Units into the skin at bedtime.    Yes [provider]  Iron-FA-B Cmp-C-Biot-Probiotic (FUSION PLUS) CAPS Take 1 capsule by mouth every evening.  11/19/19  Yes [provider]  losartan (COZAAR) 100 MG tablet Take 100 mg by mouth daily.   Yes [provider]  metoprolol (LOPRESSOR) 50 MG tablet Take 75-100 mg by mouth See admin instructions. Take 2 tablets (100 mg) by mouth in the morning & take 1.5 tablets (75 mg) by mouth in the evening.   Yes [provider]  Multiple Vitamins-Minerals (PRESERVISION AREDS 2 PO) Take 1 capsule by mouth 2 (two) times daily.    Yes [provider]  NOVOLOG FLEXPEN 100 UNIT/ML FlexPen Inject 0-16 Units into the skin in the morning, at noon, and at bedtime. Sliding scale insulin 09/04/19  Yes [provider]  ondansetron (ZOFRAN) 4 MG tablet Take 4 mg by mouth every 8 (eight) hours as needed for nausea or vomiting.   Yes [provider]  Pitavastatin Calcium (LIVALO) 1  MG TABS Take 1 mg by mouth at bedtime.   Yes [provider]  triamterene-hydrochlorothiazide (MAXZIDE-25) 37.5-25 MG tablet Take 2 capsules by mouth daily.    Yes [provider]  venlafaxine (EFFEXOR) 37.5 MG tablet Take 37.5 mg by mouth 2 (two) times daily. 10/27/19  Yes [provider]  DROPLET PEN NEEDLES 32G X 4 MM MISC  04/19/20   [provider]    Inpatient Medications: Scheduled Meds: . sodium chloride   Intravenous Once  . ALPRAZolam  0.25 mg Oral QHS  . docusate sodium  100 mg Oral BID  . insulin aspart  0-15 Units Subcutaneous TID WC  . insulin glargine  30 Units Subcutaneous QHS  . metoprolol tartrate  12.5 mg Oral Once  . metoprolol tartrate  25 mg Oral BID  . [START ON 07/16/2020] pantoprazole  40 mg Intravenous Q12H  . pravastatin  20 mg Oral q1800  . venlafaxine  37.5 mg Oral BID   Continuous Infusions: . sodium chloride 10 mL/hr at 07/12/20 2259  . pantoprozole (PROTONIX) infusion 8 mg/hr (07/13/20 0736)   PRN Meds: acetaminophen, albuterol, ondansetron  Allergies:    Allergies  Allergen Reactions  . Vasotec Shortness Of Breath and Other (See Comments)    wheezing  . Atorvastatin Other (See Comments)    Leg cramps   . Codeine Nausea And Vomiting  . Cymbalta [Duloxetine Hcl] Itching  . Lansoprazole Itching, Swelling, Rash and Other (See Comments)    Redness, swelling of mouth   . Morphine And Related Itching  . Sulfate Itching  . Tramadol Itching  . Adhesive [Tape] Rash  . Scopolamine Rash    Social History:   Social History   Socioeconomic History  . Marital status: Married    Spouse name: Charlotte Crumb  . Number of children: 2  . Years of education: 36  . Highest education level: Not on file  Occupational History    Comment: retired Pharmacist, hospital, IT sales professional  Tobacco Use  . Smoking status: Never Smoker  . Smokeless tobacco: Never Used  Vaping Use  . Vaping Use: Never used  Substance and Sexual Activity  .  Alcohol use: No  . Drug use: No  . Sexual activity: Never    Birth control/protection: Surgical  Other Topics  Concern  . Not on file  Social History Narrative   Lives with husband   Caffeine use- none   Social Determinants of Health   Financial Resource Strain:   . Difficulty of Paying Living Expenses: Not on file  Food Insecurity:   . Worried About Charity fundraiser in the Last Year: Not on file  . Ran Out of Food in the Last Year: Not on file  Transportation Needs:   . Lack of Transportation (Medical): Not on file  . Lack of Transportation (Non-Medical): Not on file  Physical Activity:   . Days of Exercise per Week: Not on file  . Minutes of Exercise per Session: Not on file  Stress:   . Feeling of Stress : Not on file  Social Connections:   . Frequency of Communication with Friends and Family: Not on file  . Frequency of Social Gatherings with Friends and Family: Not on file  . Attends Religious Services: Not on file  . Active Member of Clubs or Organizations: Not on file  . Attends Archivist Meetings: Not on file  . Marital Status: Not on file  Intimate Partner Violence:   . Fear of Current or Ex-Partner: Not on file  . Emotionally Abused: Not on file  . Physically Abused: Not on file  . Sexually Abused: Not on file    Family History:   Family History  Problem Relation Age of Onset  . Lung cancer Mother   . Heart attack Father   . Colon cancer Neg Hx   . Stroke Neg Hx   . Migraines Neg Hx    Family Status:  Family Status  Relation Name Status  . Mother  Deceased  . Father  Deceased  . Neg Hx  (Not Specified)    ROS:  Please see the history of present illness.  All other ROS reviewed and negative.     Physical Exam/Data:   Vitals:   07/13/20 0900 07/13/20 0915 07/13/20 1000 07/13/20 1153  BP: 137/73 123/70 (!) 156/74 (!) 141/75  Pulse: (!) 105 (!) 102 (!) 113 95  Resp: 16 18 18 19   Temp: 98.2 F (36.8 C) 98.2 F (36.8 C) 98.4 F  (36.9 C) 98.6 F (37 C)  TempSrc: Oral Oral Oral Oral  SpO2: 100% 100% 100% 100%  Weight:      Height:        Intake/Output Summary (Last 24 hours) at 07/13/2020 1243 Last data filed at 07/13/2020 1150 Gross per 24 hour  Intake 465.15 ml  Output --  Net 465.15 ml    Last 3 Weights 07/12/2020 07/01/2020 06/29/2020  Weight (lbs) 157 lb 6.5 oz 155 lb 153 lb 3.2 oz  Weight (kg) 71.4 kg 70.308 kg 69.491 kg     Body mass index is 24.65 kg/m.   General:  Well nourished, well developed, female in no acute distress HEENT: normal Lymph: no adenopathy Neck: JVD -10 cm Endocrine:  No thryomegaly Vascular: No carotid bruits; 4/4 extremity pulses 2+  Cardiac:  normal S1, S2; rapid and irregular rate and rhythm; soft murmur Lungs: Few rales bases bilaterally, no wheezing, rhonchi   Abd: soft, slightly tender, no hepatomegaly  Ext: no edema Musculoskeletal:  No deformities, BUE and BLE strength weak but equal Skin: warm and dry  Neuro:  CNs 2-12 intact, no focal abnormalities noted Psych:  Normal affect   EKG:  The EKG was personally reviewed and demonstrates: 11/15 ECG is sinus rhythm, heart rate 80  Telemetry:  Telemetry was personally reviewed and demonstrates: SR, PACs to Atrial fib w/ HR 80-100 at about 5 am today.    CV studies:   ECHO: 07/13/2020, results pending  ECHO: 07/07/2020 1. Left ventricular ejection fraction, by estimation, is 60 to 65%. The  left ventricle has normal function. The left ventricle has no regional  wall motion abnormalities. Left ventricular diastolic parameters are  indeterminate.  2. Right ventricular systolic function is normal. The right ventricular  size is normal. There is normal pulmonary artery systolic pressure.  3. Post Watchman with 27 mm FLX not visualized on TTE . Left atrial size  was mildly dilated.  4. Residual "ASD: flow from transeptal puncture noted only left to right  by color . Evidence of atrial level shunting detected by  color flow  Doppler.  5. Previous trivial pericardial effusion noted over the RV free wall and  transverse sinus not noted on this echo.  6. The mitral valve is abnormal. Trivial mitral valve regurgitation. No  evidence of mitral stenosis. Moderate mitral annular calcification.  7. The aortic valve is tricuspid. Aortic valve regurgitation is not  visualized. Mild to moderate aortic valve sclerosis/calcification is  present, without any evidence of aortic stenosis.  8. The inferior vena cava is normal in size with greater than 50%  respiratory variability, suggesting right atrial pressure of 3 mmHg.   Laboratory Data:   Chemistry Recent Labs  Lab 07/12/20 1626 07/13/20 0418  NA 137 137  K 4.4 4.9  CL 100 100  CO2 27 26  GLUCOSE 122* 253*  BUN 27* 27*  CREATININE 1.14* 1.02*  CALCIUM 9.0 8.8*  GFRNONAA 49* 56*  ANIONGAP 10 11    Lab Results  Component Value Date   ALT 18 02/05/2016   AST 29 02/05/2016   ALKPHOS 66 02/05/2016   BILITOT 0.9 02/05/2016   Hematology Recent Labs  Lab 07/12/20 1626 07/12/20 2153 07/13/20 0418  WBC 13.5*  --   --   RBC 2.83*  --   --   HGB 8.2* 7.3* 7.0*  HCT 27.6* 24.4* 23.8*  MCV 97.5  --   --   MCH 29.0  --   --   MCHC 29.7*  --   --   RDW 15.9*  --   --   PLT 356  --   --    Cardiac Enzymes High Sensitivity Troponin:   Recent Labs  Lab 07/12/20 1626  TROPONINIHS 4      BNPNo results for input(s): BNP, PROBNP in the last 168 hours.  DDimer No results for input(s): DDIMER in the last 168 hours. TSH:  Lab Results  Component Value Date   TSH 1.008 10/30/2017   Lipids:No results found for: CHOL, HDL, LDLCALC, LDLDIRECT, TRIG, CHOLHDL HgbA1c: Lab Results  Component Value Date   HGBA1C 5.7 (H) 07/01/2020   Magnesium:  Magnesium  Date Value Ref Range Status  08/10/2008 1.7 1.5 - 2.5 mg/dL Final   Lab Results  Component Value Date   IRON 80 11/14/2016   TIBC 390 11/14/2016   FERRITIN 20 11/14/2016      Radiology/Studies:  DG Chest 2 View  Result Date: 07/12/2020 CLINICAL DATA:  Shortness of breath EXAM: CHEST - 2 VIEW COMPARISON:  01/27/2020 FINDINGS: The heart size and mediastinal contours are within normal limits. No focal airspace consolidation, pleural effusion, or pneumothorax. Interval cement augmentation of the T8 vertebral body. Unchanged alignment of thoracic spinal stimulator leads. IMPRESSION: No active cardiopulmonary disease.  Electronically Signed   By: Davina Poke D.O.   On: 07/12/2020 14:32   US BREAST LTD UNI RIGHT INC AXILLA  Result Date: 07/09/2020 CLINICAL DATA:  Recall from screening mammography with tomosynthesis, possible mass involving the INNER RIGHT breast at ANTERIOR to MIDDLE depth. EXAM: DIGITAL DIAGNOSTIC RIGHT MAMMOGRAM WITH TOMO ULTRASOUND RIGHT BREAST COMPARISON:  Previous exam(s). ACR Breast Density Category a: The breast tissue is almost entirely fatty. FINDINGS: Tomosynthesis and synthesized digital 2D spot-compression CC and MLO views of the area of concern in the RIGHT breast were obtained. Spot compression images confirm a superficial circumscribed mixed density mass in the INNER breast, containing isodense elements and fat density elements, measuring approximately 7 mm. There is no associated architectural distortion or suspicious calcifications. On correlative physical exam, there is no palpable abnormality in the INNER RIGHT breast. Targeted RIGHT breast ultrasound is performed, showing a superficial nearly round anechoic mass with a thin internal septation at the 2:30 o'clock position approximately 8 cm from the nipple measuring approximately 6 x 5 x 6 mm, demonstrating mixed posterior characteristics and demonstrating no internal power Doppler flow, corresponding to the screening mammographic finding. No suspicious solid mass is identified. IMPRESSION: Benign fat necrosis/oil cyst in the INNER RIGHT breast at the 2:30 o'clock position approximately 8  cm from nipple which accounts for the screening mammographic finding. RECOMMENDATION: Screening mammogram in one year.(Code:SM-B-01Y) I have discussed the findings and recommendations with the patient. If applicable, a reminder letter will be sent to the patient regarding the next appointment. BI-RADS CATEGORY  2: Benign. Electronically Signed   By: Evangeline Dakin M.D.   On: 07/09/2020 15:13   MM DIAG BREAST TOMO UNI RIGHT  Result Date: 07/09/2020 CLINICAL DATA:  Recall from screening mammography with tomosynthesis, possible mass involving the INNER RIGHT breast at ANTERIOR to MIDDLE depth. EXAM: DIGITAL DIAGNOSTIC RIGHT MAMMOGRAM WITH TOMO ULTRASOUND RIGHT BREAST COMPARISON:  Previous exam(s). ACR Breast Density Category a: The breast tissue is almost entirely fatty. FINDINGS: Tomosynthesis and synthesized digital 2D spot-compression CC and MLO views of the area of concern in the RIGHT breast were obtained. Spot compression images confirm a superficial circumscribed mixed density mass in the INNER breast, containing isodense elements and fat density elements, measuring approximately 7 mm. There is no associated architectural distortion or suspicious calcifications. On correlative physical exam, there is no palpable abnormality in the INNER RIGHT breast. Targeted RIGHT breast ultrasound is performed, showing a superficial nearly round anechoic mass with a thin internal septation at the 2:30 o'clock position approximately 8 cm from the nipple measuring approximately 6 x 5 x 6 mm, demonstrating mixed posterior characteristics and demonstrating no internal power Doppler flow, corresponding to the screening mammographic finding. No suspicious solid mass is identified. IMPRESSION: Benign fat necrosis/oil cyst in the INNER RIGHT breast at the 2:30 o'clock position approximately 8 cm from nipple which accounts for the screening mammographic finding. RECOMMENDATION: Screening mammogram in one year.(Code:SM-B-01Y) I  have discussed the findings and recommendations with the patient. If applicable, a reminder letter will be sent to the patient regarding the next appointment. BI-RADS CATEGORY  2: Benign. Electronically Signed   By: Evangeline Dakin M.D.   On: 07/09/2020 15:13    Assessment and Plan:   1. Acute diastolic CHF - pt w/ JVD and SOB, but NAD on CXR - echo pending to eval for pericardial effusion - 11/10 echo w/ ASD from septal puncture, trivial effusion, EF 60-65%  2. Persistent Afib - HR is high-normal -  pt asymptomatic regarding palpitations, unclear if the Afib is contributing to fatigue, but this is more likely from anemia - Lopressor increased from 12.5 >> 25 mg bid - she had gotten prednisone 60 mg x 1 last pm for her wheezing - TSH has been ok in the past, with recurrent Afib and fatigue, will recheck - cannot anticoagulate at this time, MD advise on DCCV  3. GIB, anemia - has had antral ulcers in the past 2nd gastric antral vascular ectasia, s/p RFA, argon plasma coagulation therapy - GI has seen, plans EGD in am - to get 2 U PRBCs today, I do not see any Lasix ordered, will give 40 mg x 1 after 2nd unit - per IM/GI  Otherwise, per IM Active Problems:   Asthma, chronic   HTN (hypertension)   DM type 2 with diabetic peripheral neuropathy (HCC)   Acute GI bleeding   Acute blood loss anemia   Persistent atrial fibrillation ([facility])   SOB (shortness of breath)   ABLA (acute blood loss anemia)     For questions or updates, please contact [facility] Please consult [email or web address] for contact info under Cardiology/STEMI.   Jonetta Speak, PA-C  07/13/2020 12:43 PM

## 2020-07-13 NOTE — Progress Notes (Addendum)
PROGRESS NOTE  [name] URK:[id number] DOB: 1941/04/15 DOA: 07/12/2020 PCP: Leighton Ruff, MD  HPI/Recap of past 24 hours:  [name] is a 79 y.o. female with medical history significant of PAF on Eliquis, h/o GI bleeds, remote h/o asthma - with no flares or need for medication for several years, DM2, GERD, HTN.  Due to high risk for recurrent GI bleeding she underwent Watchman procedure in order to come off chronic anti-coagulation, done 07/01/20. Over the past several days she has had increased SOB/DOE, chest tightness and wheezing. Due to her symptoms she presents to [facility] for evaluation. She denies Abdominal pain, chest pain, denies any frank bleeding, or productive cough.   ED Course: T 98.2  148/55, HR 87  RR 21. ED-MD reported upper airway wheezing with her lungs being clear. She was given an albuterol MDI treatment and 60 mg prednisone with improvement in her symptoms. Lab: Covid NEGATIVE, WBC 13.5, Hgb 8.2, Stool heme positive, glucose 122, Cr 1.14, Troponin 4. CXR - NAD. TRH called to admit for further evaluation and treatment.   Overnight she had an acute drop in her hemoglobin, down to 7.0 from 8.2K with concern for upper GI bleed.  2 units PRBC ordered to be transfused.  07/13/20: Seen and examined, husband at bedside.  She denies any epigastric pain or nausea at the time of this visit.  Her last bowel movement was on 07/12/2020 states it was dark and states that she also takes iron supplement.  Acute drop in hemoglobin this morning.  Is being transfused 2 units of PRBC.  We will continue to monitor H&H every 6 hours.  GI has been consulted.   Assessment/Plan: Active Problems:   Asthma, chronic   HTN (hypertension)   DM type 2 with diabetic peripheral neuropathy (HCC)   Acute GI bleeding   Acute blood loss anemia   Persistent atrial fibrillation (HCC)   SOB (shortness of breath)   ABLA (acute blood loss anemia)  Acute blood loss anemia with presumed upper  GI bleed Baseline hemoglobin appears to be 12.6K Presented with hemoglobin of 8.2K and positive FOBT.  Her hemoglobin has been downtrending, 7.0 on 07/13/2020. 2 units PRBC ordered to be transfused. Monitor H&H every 6 hours Maintain MAP greater than 65  Presumed upper GI bleed Last endoscopy, EGD was done on 11/26/2019 by Dr. Watt Climes which showed:Gastric antral vascular ectasia without bleeding. Treated with radiofrequency ablation. Continue Protonix drip GI consult  Dyspnea, suspect secondary to symptomatic anemia Presented with worsening dyspnea on exertion.  Denies chest pain. Personally reviewed her chest x-ray done on 07/12/2020, no active disease, no infiltrates, no increase in pulmonary vascularity, no evidence of pulmonary edema or pleural effusions.  Paroxysmal A. Fib Intermittent tachycardia noted this morning Resume home p.o. Lopressor at lower doses 25 mg twice daily due to suspected active upper GI bleeding Monitor rate on telemetry Eliquis on hold due to acute blood loss anemia with suspected upper GI bleed  Asthma, no evidence of an asthma exacerbation at the time of this visit Bronchodilators as needed Maintain O2 saturation greater than 92%. Currently her oxygen saturation is 100% on 2 L.  Essential hypertension Continue to hold off home BP medications She was restarted on home p.o. Lopressor at the lower dose due to tachycardia.  Diastolic GBT/51-VOH postop watchman procedure Euvolemic on exam Received 2 doses of Lasix IV 20 mg 2D echo has been completed, follow results. Strict I's and O's and daily weight Cardiology consult  Code Status: Full code  Family Communication: Husband at bedside.  Disposition Plan: Likely will discharge to home when GI signs off.   Consultants:  GI  Cardiology  Procedures:  None.  Antimicrobials:  None.  DVT prophylaxis: SCDs.  Status is: Inpatient    Dispo:  Patient From: Home  Planned Disposition:  Home  Expected discharge date: 07/16/20  Medically stable for discharge: No, ongoing management of upper GI bleed.         Objective: Vitals:   07/13/20 0636 07/13/20 0900 07/13/20 0915 07/13/20 1000  BP: 140/72 137/73 123/70 (!) 156/74  Pulse: (!) 101 (!) 105 (!) 102 (!) 113  Resp: 18 16 18 18   Temp: 98.8 F (37.1 C) 98.2 F (36.8 C) 98.2 F (36.8 C) 98.4 F (36.9 C)  TempSrc: Oral Oral Oral Oral  SpO2: 100% 100% 100% 100%  Weight:      Height:        Intake/Output Summary (Last 24 hours) at 07/13/2020 1123 Last data filed at 07/13/2020 0400 Gross per 24 hour  Intake 95.15 ml  Output --  Net 95.15 ml   Filed Weights   07/12/20 2230  Weight: 71.4 kg    Exam:  . General: 79 y.o. year-old female well developed well nourished in no acute distress.  Alert and oriented x3. . Cardiovascular: Regular rate and rhythm with no rubs or gallops.  No thyromegaly or JVD noted.   Marland Kitchen Respiratory: Clear to auscultation with no wheezes or rales. Good inspiratory effort. . Abdomen: Soft nontender nondistended with normal bowel sounds x4 quadrants. . Musculoskeletal: No lower extremity edema. 2/4 pulses in all 4 extremities. . Skin: No ulcerative lesions noted or rashes . Psychiatry: Mood is appropriate for condition and setting   Data Reviewed: CBC: Recent Labs  Lab 07/12/20 1626 07/12/20 2153 07/13/20 0418  WBC 13.5*  --   --   HGB 8.2* 7.3* 7.0*  HCT 27.6* 24.4* 23.8*  MCV 97.5  --   --   PLT 356  --   --    Basic Metabolic Panel: Recent Labs  Lab 07/12/20 1626 07/13/20 0418  NA 137 137  K 4.4 4.9  CL 100 100  CO2 27 26  GLUCOSE 122* 253*  BUN 27* 27*  CREATININE 1.14* 1.02*  CALCIUM 9.0 8.8*   GFR: Estimated Creatinine Clearance: 43.5 mL/min (A) (by C-G formula based on SCr of 1.02 mg/dL (H)). Liver Function Tests: No results for input(s): AST, ALT, ALKPHOS, BILITOT, PROT, ALBUMIN in the last 168 hours. No results for input(s): LIPASE, AMYLASE in the  last 168 hours. No results for input(s): AMMONIA in the last 168 hours. Coagulation Profile: No results for input(s): INR, PROTIME in the last 168 hours. Cardiac Enzymes: No results for input(s): CKTOTAL, CKMB, CKMBINDEX, TROPONINI in the last 168 hours. BNP (last 3 results) No results for input(s): PROBNP in the last 8760 hours. HbA1C: No results for input(s): HGBA1C in the last 72 hours. CBG: Recent Labs  Lab 07/12/20 2238 07/13/20 0812  GLUCAP 216* 197*   Lipid Profile: No results for input(s): CHOL, HDL, LDLCALC, TRIG, CHOLHDL, LDLDIRECT in the last 72 hours. Thyroid Function Tests: No results for input(s): TSH, T4TOTAL, FREET4, T3FREE, THYROIDAB in the last 72 hours. Anemia Panel: No results for input(s): VITAMINB12, FOLATE, FERRITIN, TIBC, IRON, RETICCTPCT in the last 72 hours. Urine analysis:    Component Value Date/Time   COLORURINE YELLOW 01/27/2020 [place] 01/27/2020 1155   LABSPEC 1.014 01/27/2020  [place] 5.0 01/27/2020 1155   GLUCOSEU NEGATIVE 01/27/2020 1155   HGBUR NEGATIVE 01/27/2020 [place] 01/27/2020 1155   KETONESUR NEGATIVE 01/27/2020 1155   PROTEINUR NEGATIVE 01/27/2020 1155   UROBILINOGEN 0.2 03/20/2011 2257   NITRITE NEGATIVE 01/27/2020 1155   LEUKOCYTESUR NEGATIVE 01/27/2020 1155   Sepsis Labs: @LABRCNTIP (procalcitonin:4,lacticidven:4)  ) Recent Results (from the past 240 hour(s))  Respiratory Panel by RT PCR (Flu A&B, Covid) - Nasopharyngeal Swab     Status: None   Collection Time: 07/12/20  6:15 PM   Specimen: Nasopharyngeal Swab  Result Value Ref Range Status   SARS Coronavirus 2 by RT PCR NEGATIVE NEGATIVE Final    Comment: (NOTE) SARS-CoV-2 target nucleic acids are NOT DETECTED.  The SARS-CoV-2 RNA is generally detectable in upper respiratoy specimens during the acute phase of infection. The lowest concentration of SARS-CoV-2 viral copies this assay can detect is 131 copies/mL. A negative  result does not preclude SARS-Cov-2 infection and should not be used as the sole basis for treatment or other patient management decisions. A negative result may occur with  improper specimen collection/handling, submission of specimen other than nasopharyngeal swab, presence of viral mutation(s) within the areas targeted by this assay, and inadequate number of viral copies (<131 copies/mL). A negative result must be combined with clinical observations, patient history, and epidemiological information. The expected result is Negative.  Fact Sheet for Patients:  PinkCheek.be  Fact Sheet for Healthcare Providers:  GravelBags.it  This test is no t yet approved or cleared by the [place] FDA and  has been authorized for detection and/or diagnosis of SARS-CoV-2 by FDA under an Emergency Use Authorization (EUA). This EUA will remain  in effect (meaning this test can be used) for the duration of the COVID-19 declaration under Section 564(b)(1) of the Act, 21 U.S.C. section 360bbb-3(b)(1), unless the authorization is terminated or revoked sooner.     Influenza A by PCR NEGATIVE NEGATIVE Final   Influenza B by PCR NEGATIVE NEGATIVE Final    Comment: (NOTE) The Xpert Xpress SARS-CoV-2/FLU/RSV assay is intended as an aid in  the diagnosis of influenza from Nasopharyngeal swab specimens and  should not be used as a sole basis for treatment. Nasal washings and  aspirates are unacceptable for Xpert Xpress SARS-CoV-2/FLU/RSV  testing.  Fact Sheet for Patients: PinkCheek.be  Fact Sheet for Healthcare Providers: GravelBags.it  This test is not yet approved or cleared by the [place] FDA and  has been authorized for detection and/or diagnosis of SARS-CoV-2 by  FDA under an Emergency Use Authorization (EUA). This EUA will remain  in effect (meaning this test can be used)  for the duration of the  Covid-19 declaration under Section 564(b)(1) of the Act, 21  U.S.C. section 360bbb-3(b)(1), unless the authorization is  terminated or revoked. Performed at [facility], [place] [address] [place], [place] [address]       Studies: DG Chest 2 View  Result Date: 07/12/2020 CLINICAL DATA:  Shortness of breath EXAM: CHEST - 2 VIEW COMPARISON:  01/27/2020 FINDINGS: The heart size and mediastinal contours are within normal limits. No focal airspace consolidation, pleural effusion, or pneumothorax. Interval cement augmentation of the T8 vertebral body. Unchanged alignment of thoracic spinal stimulator leads. IMPRESSION: No active cardiopulmonary disease. Electronically Signed   By: Davina Poke D.O.   On: 07/12/2020 14:32    Scheduled Meds: . sodium chloride   Intravenous Once  . ALPRAZolam  0.25 mg Oral QHS  .  aspirin EC  81 mg Oral Daily  . docusate sodium  100 mg Oral BID  . insulin aspart  0-15 Units Subcutaneous TID WC  . insulin glargine  30 Units Subcutaneous QHS  . metoprolol tartrate  12.5 mg Oral q morning - 10a  . [START ON 07/16/2020] pantoprazole  40 mg Intravenous Q12H  . pravastatin  20 mg Oral q1800  . venlafaxine  37.5 mg Oral BID    Continuous Infusions: . sodium chloride 10 mL/hr at 07/12/20 2259  . pantoprozole (PROTONIX) infusion 8 mg/hr (07/13/20 0736)     LOS: 1 day     Kayleen Memos, MD [facility] Pager [phone]  If 7PM-7AM, please contact night-coverage [email or web address] Password [facility] 07/13/2020, 11:23 AM

## 2020-07-13 NOTE — Progress Notes (Signed)
Echocardiogram 2D Echocardiogram has been performed.  Oneal Deputy Jewelia Bocchino 07/13/2020, 11:22 AM

## 2020-07-13 NOTE — Consult Note (Signed)
Referring Provider:  [facility] Primary Care Physician:  Leighton Ruff, MD Primary Gastroenterologist:  Dr. Clarene Essex  Reason for Consultation: Anemia, heme positive stool  HPI: [name] is a 79 y.o. female admitted through the emergency room yesterday with a several day history of significant dyspnea, in association with a drop in hemoglobin and heme positive stool.    She has a history of chronic atrial fibrillation, chronic anticoagulation, and recurring anemia requiring occasional transfusion (most recently about 8 months ago), and is 2 weeks status post placement of a Watchman device (November 4) with the hope that that would allow her to eventually come off chronic anticoagulation.  Following that procedure her Eliquis was resumed and low-dose aspirin was started (is on high-dose Nexium, 40 mg twice daily) for a 45-day interval pending repeat echocardiographic evaluation.  The patient's chronic, recurring anemia has been attributed to gastric antral vascular ectasia, which had been treated repeatedly endoscopically with argon plasma coagulation therapy, most recently on March 31 of this year, at which time there appeared to be minimal residual vascular ectasia but touchup therapy was performed again with APC.    Interestingly, the patient never required a transfusion for her anemia until this past March, when she received 1 unit of packed cells at [facility].  She had not required any subsequent transfusions up until today.  The patient has had a gradual, progressive drop in hemoglobin in recent months.  Serial hemoglobins are as follows: Jan 08, 2020 it was 13.0; July 23 it was 12.4; October 7 it was 11.9; October 21 it was 11.5; prior to the Watchman procedure it was 10.8.  On presentation to the emergency room last night, it was 8.2, and overnight it has fallen progressively to 7.3 and is currently 7.0.  As of this time, the patient's Eliquis has been stopped.   Interestingly, there has been no associated rise in BUN, which remains essentially stable at 27.  The patient has not noticed any overt bleeding such as maroon stools, and although her stools are dark, this can be explained by the fact she is on iron; she has not noticed any increased frequency of bowel movements recently in association with her dyspnea and drop in hemoglobin.  The patient is up-to-date on colonoscopy, with her most recent colonoscopy having been in November, 2019, at which time a 5-year follow-up exam was recommended.  Past Medical History:  Diagnosis Date  . Acute GI bleeding 05/13/2015  . Anxiety   . Arthritis   . Asthma   . Atrial fibrillation ([place]) 10/29/2017  . Bakers cyst, left   . Chronic kidney disease    stage 3  . Constipation   . Diabetes mellitus    type 2  . Dysrhythmia   . GERD (gastroesophageal reflux disease)   . Heart murmur   . History of cellulitis   . History of dizziness   . History of hiatal hernia    tiny 06/15/2016  . Hypertension   . Iron deficiency anemia   . Peripheral vascular disease ([place])   . PONV (postoperative nausea and vomiting)   . Pulmonary nodule     Past Surgical History:  Procedure Laterality Date  . APPENDECTOMY  1952  . BIOPSY  10/16/2018   Procedure: BIOPSY;  Surgeon: Clarene Essex, MD;  Location: [facility];  Service: Endoscopy;;  . BIOPSY  11/26/2019   Procedure: BIOPSY;  Surgeon: Clarene Essex, MD;  Location: [facility];  Service: Endoscopy;;  . COLONOSCOPY W/  POLYPECTOMY  05/15/2013  . ESOPHAGOGASTRODUODENOSCOPY (EGD) WITH PROPOFOL N/A 06/15/2016   Procedure: ESOPHAGOGASTRODUODENOSCOPY (EGD) WITH PROPOFOL;  Surgeon: Clarene Essex, MD;  [facility];  Service: Endoscopy;  Laterality: N/A;  . ESOPHAGOGASTRODUODENOSCOPY (EGD) WITH PROPOFOL N/A 10/16/2018   Procedure: ESOPHAGOGASTRODUODENOSCOPY (EGD) WITH PROPOFOL;  Surgeon: Clarene Essex, MD;  [facility];  Service: Endoscopy;  Laterality: N/A;  .  ESOPHAGOGASTRODUODENOSCOPY (EGD) WITH PROPOFOL N/A 11/26/2019   Procedure: ESOPHAGOGASTRODUODENOSCOPY (EGD) WITH PROPOFOL;  Surgeon: Clarene Essex, MD;  [facility];  Service: Endoscopy;  Laterality: N/A;  . EYE SURGERY Bilateral    cataracts removed  . GI RADIOFREQUENCY ABLATION  10/16/2018   Procedure: GI RADIOFREQUENCY ABLATION;  Surgeon: Clarene Essex, MD;  [facility]: [facility];  Service: Endoscopy;;  . GI RADIOFREQUENCY ABLATION N/A 11/26/2019   Procedure: GI RADIOFREQUENCY ABLATION;  Surgeon: Clarene Essex, MD;  [facility];  Service: Endoscopy;  Laterality: N/A;  . HAND SURGERY Left 2010   operated on index and ring finger  . INCONTINENCE SURGERY    . JOINT REPLACEMENT Right    knee  . KNEE ARTHROSCOPY  2003 2005   right knee  . KYPHOPLASTY N/A 01/29/2020   Procedure: T8 KYPHOPLASTY;  Surgeon: Melina Schools, MD;  Location: [facility];  Service: Orthopedics;  Laterality: N/A;  60 mins Local with [facility]  . LEFT ATRIAL APPENDAGE OCCLUSION N/A 07/01/2020   Procedure: LEFT ATRIAL APPENDAGE OCCLUSION;  Surgeon: Vickie Epley, MD;  Location: [facility];  Service: Cardiovascular;  Laterality: N/A;  . mass fallopian tube    . SPINAL CORD STIMULATOR BATTERY EXCHANGE N/A 09/09/2015   Procedure: SPINAL CORD STIMULATOR BATTERY EXCHANGE;  Surgeon: Melina Schools, MD;  Location: [facility];  Service: Orthopedics;  Laterality: N/A;  . TOTAL KNEE ARTHROPLASTY Left 11/05/2017   Procedure: LEFT TOTAL KNEE ARTHROPLASTY;  Surgeon: Paralee Cancel, MD;  [facility];  Service: Orthopedics;  Laterality: Left;  Adductor Block  . VAGINAL HYSTERECTOMY  1977   1 ovary removed    Prior to Admission medications   Medication Sig Start Date End Date Taking? Authorizing Provider  acetaminophen (TYLENOL) 500 MG tablet Take 1,000 mg by mouth every 8 (eight) hours as needed for moderate pain.   Yes [provider]  albuterol (PROAIR HFA) 108 (90 BASE) MCG/ACT inhaler Inhale 2 puffs  into the lungs every 4 (four) hours as needed for wheezing or shortness of breath. 02/02/15  Yes Collene Gobble, MD  ALPRAZolam Duanne Moron) 0.25 MG tablet Take 1 tablet by mouth at bedtime.  03/25/20  Yes [provider]  amLODipine (NORVASC) 10 MG tablet Take 10 mg by mouth daily.   Yes [provider]  aspirin EC 81 MG tablet Take 81 mg by mouth daily. Swallow whole.   Yes [provider]  diphenhydrAMINE (BENADRYL) 25 MG tablet Take 25 mg by mouth daily as needed for allergies.   Yes [provider]  docusate sodium (COLACE) 100 MG capsule Take 100 mg by mouth 2 (two) times daily as needed for mild constipation.    Yes [provider]  ELIQUIS 5 MG TABS tablet TAKE 1 TABLET TWICE DAILY Patient taking differently: Take 5 mg by mouth 2 (two) times daily.  12/30/19  Yes Jerline Pain, MD  esomeprazole (NEXIUM) 40 MG capsule Take 40 mg by mouth in the morning and at bedtime. Morning & afternoon. 04/23/15  Yes [provider]  fluticasone (FLONASE) 50 MCG/ACT nasal spray Place 1 spray into both  nostrils daily as needed for allergies.    Yes [provider]  gabapentin (NEURONTIN) 300 MG capsule Take 600 mg by mouth in the morning, at noon, and at bedtime.    Yes [provider]  Insulin Glargine (LANTUS SOLOSTAR) 100 UNIT/ML Solostar Pen Inject 30 Units into the skin at bedtime.    Yes [provider]  Iron-FA-B Cmp-C-Biot-Probiotic (FUSION PLUS) CAPS Take 1 capsule by mouth every evening.  11/19/19  Yes [provider]  losartan (COZAAR) 100 MG tablet Take 100 mg by mouth daily.   Yes [provider]  metoprolol (LOPRESSOR) 50 MG tablet Take 75-100 mg by mouth See admin instructions. Take 2 tablets (100 mg) by mouth in the morning & take 1.5 tablets (75 mg) by mouth in the evening.   Yes [provider]  Multiple Vitamins-Minerals (PRESERVISION AREDS 2 PO) Take 1 capsule by mouth 2 (two) times daily.     Yes [provider]  NOVOLOG FLEXPEN 100 UNIT/ML FlexPen Inject 0-16 Units into the skin in the morning, at noon, and at bedtime. Sliding scale insulin 09/04/19  Yes [provider]  ondansetron (ZOFRAN) 4 MG tablet Take 4 mg by mouth every 8 (eight) hours as needed for nausea or vomiting.   Yes [provider]  Pitavastatin Calcium (LIVALO) 1 MG TABS Take 1 mg by mouth at bedtime.   Yes [provider]  triamterene-hydrochlorothiazide (MAXZIDE-25) 37.5-25 MG tablet Take 2 capsules by mouth daily.    Yes [provider]  venlafaxine (EFFEXOR) 37.5 MG tablet Take 37.5 mg by mouth 2 (two) times daily. 10/27/19  Yes [provider]  DROPLET PEN NEEDLES 32G X 4 MM MISC  04/19/20   [provider]    Current Facility-Administered Medications  Medication Dose Route Frequency Provider Last Rate Last Admin  . 0.9 %  sodium chloride infusion (Manually program via Guardrails IV Fluids)   Intravenous Once Lang Snow, FNP      . 0.9 %  sodium chloride infusion   Intravenous Continuous Norins, Heinz Knuckles, MD 10 mL/hr at 07/12/20 2259 New Bag at 07/12/20 2259  . acetaminophen (TYLENOL) tablet 1,000 mg  1,000 mg Oral Q8H PRN Norins, Heinz Knuckles, MD      . albuterol (VENTOLIN HFA) 108 (90 Base) MCG/ACT inhaler 2 puff  2 puff Inhalation Q6H PRN Norins, Heinz Knuckles, MD      . ALPRAZolam Duanne Moron) tablet 0.25 mg  0.25 mg Oral QHS Norins, Heinz Knuckles, MD   0.25 mg at 07/12/20 2106  . aspirin EC tablet 81 mg  81 mg Oral Daily Neena Rhymes, MD   81 mg at 07/12/20 2106  . docusate sodium (COLACE) capsule 100 mg  100 mg Oral BID Neena Rhymes, MD   100 mg at 07/12/20 2105  . insulin aspart (novoLOG) injection 0-15 Units  0-15 Units Subcutaneous TID WC Norins, Heinz Knuckles, MD      . insulin glargine (LANTUS) injection 30 Units  30 Units Subcutaneous QHS Efraim Kaufmann, RPH   30 Units at 07/12/20 2339  . metoprolol tartrate (LOPRESSOR) tablet 12.5 mg  12.5 mg  Oral q morning - 10a Hall, Carole N, DO      . ondansetron [facility]) tablet 4 mg  4 mg Oral Q8H PRN Norins, Heinz Knuckles, MD      . pantoprazole (PROTONIX) 80 mg in sodium chloride 0.9 % 100 mL (0.8 mg/mL) infusion  8 mg/hr Intravenous Continuous Norins, Heinz Knuckles, MD 10  mL/hr at 07/13/20 0736 8 mg/hr at 07/13/20 0736  . [START ON 07/16/2020] pantoprazole (PROTONIX) injection 40 mg  40 mg Intravenous Q12H Norins, Heinz Knuckles, MD      . pravastatin (PRAVACHOL) tablet 20 mg  20 mg Oral q1800 Norins, Heinz Knuckles, MD      . venlafaxine [facility]) tablet 37.5 mg  37.5 mg Oral BID Neena Rhymes, MD   37.5 mg at 07/12/20 2330    Allergies as of 07/12/2020 - Review Complete 07/12/2020  Allergen Reaction Noted  . Vasotec Shortness Of Breath and Other (See Comments) 03/21/2011  . Atorvastatin Other (See Comments) 12/22/2015  . Codeine Nausea And Vomiting 03/21/2011  . Cymbalta [duloxetine hcl] Itching 06/14/2016  . Lansoprazole Itching, Swelling, Rash, and Other (See Comments) 03/21/2011  . Morphine and related Itching 03/21/2011  . Sulfate Itching 03/21/2011  . Tramadol Itching 06/25/2020  . Adhesive [tape] Rash 12/22/2015  . Scopolamine Rash 12/22/2015    Family History  Problem Relation Age of Onset  . Lung cancer Mother   . Heart attack Father   . Colon cancer Neg Hx   . Stroke Neg Hx   . Migraines Neg Hx     Social History   Socioeconomic History  . Marital status: Married    Spouse name: Charlotte Crumb  . Number of children: 2  . Years of education: 53  . Highest education level: Not on file  Occupational History    Comment: retired Pharmacist, hospital, IT sales professional  Tobacco Use  . Smoking status: Never Smoker  . Smokeless tobacco: Never Used  Vaping Use  . Vaping Use: Never used  Substance and Sexual Activity  . Alcohol use: No  . Drug use: No  . Sexual activity: Never    Birth control/protection: Surgical  Other Topics Concern  . Not on file  Social History Narrative   Lives with husband    Caffeine use- none   Social Determinants of Health   Financial Resource Strain:   . Difficulty of Paying Living Expenses: Not on file  Food Insecurity:   . Worried About Charity fundraiser in the Last Year: Not on file  . Ran Out of Food in the Last Year: Not on file  Transportation Needs:   . Lack of Transportation (Medical): Not on file  . Lack of Transportation (Non-Medical): Not on file  Physical Activity:   . Days of Exercise per Week: Not on file  . Minutes of Exercise per Session: Not on file  Stress:   . Feeling of Stress : Not on file  Social Connections:   . Frequency of Communication with Friends and Family: Not on file  . Frequency of Social Gatherings with Friends and Family: Not on file  . Attends Religious Services: Not on file  . Active Member of Clubs or Organizations: Not on file  . Attends Archivist Meetings: Not on file  . Marital Status: Not on file  Intimate Partner Violence:   . Fear of Current or Ex-Partner: Not on file  . Emotionally Abused: Not on file  . Physically Abused: Not on file  . Sexually Abused: Not on file    Review of Systems: No bleeding from other parts of her body, no excessive bruising.  Dyspnea as noted above, with wheezing on presentation, now improved.  No chest pain.  No urinary symptoms.  She does have chronic stomach burning, but nothing different recently.  Physical Exam: Vital signs in last 24 hours: Temp:  [98.2  F (36.8 C)-99.7 F (37.6 C)] 98.2 F (36.8 C) (11/16 0915) Pulse Rate:  [82-105] 102 (11/16 0915) Resp:  [14-27] 18 (11/16 0915) BP: (96-159)/(47-87) 123/70 (11/16 0915) SpO2:  [92 %-100 %] 100 % (11/16 0915) Weight:  [71.4 kg] 71.4 kg (11/15 2230) Last BM Date: 07/11/20 (Per Pt.) General:   Alert,  Well-developed, well-nourished,NAD Head:  Normocephalic and atraumatic. Eyes:  Sclera clear, no icterus.   Conjunctiva pale. Mouth:   No ulcerations or lesions.  Oropharynx pink but somewhat  dry. Lungs:  Clear throughout to auscultation.   No wheezes, crackles, or rhonchi. No evident respiratory distress. Heart:   Irregular rhythm with adequately controlled rate; soft systolic murmur at upper left sternal border. Abdomen:  Soft, nontender, nontympanitic, and somewhat adipose but nondistended. No masses, hepatosplenomegaly or ventral hernias noted.  Msk:   Symmetrical without gross deformities. Pulses: Adequate radial pulse is noted. Extremities:   Without clubbing, cyanosis, or edema. Neurologic:  Alert and coherent;  grossly normal neurologically. Skin:  Intact without significant lesions or rashes. Psych:   Alert and cooperative. Normal affect, somewhat despondent mood.  Intake/Output from previous day: 11/15 0701 - 11/16 0700 In: 95.2 [I.V.:95.2] Out: -  Intake/Output this shift: No intake/output data recorded.  Lab Results: Recent Labs    07/12/20 1626 07/12/20 2153 07/13/20 0418  WBC 13.5*  --   --   HGB 8.2* 7.3* 7.0*  HCT 27.6* 24.4* 23.8*  PLT 356  --   --    BMET Recent Labs    07/12/20 1626 07/13/20 0418  NA 137 137  K 4.4 4.9  CL 100 100  CO2 27 26  GLUCOSE 122* 253*  BUN 27* 27*  CREATININE 1.14* 1.02*  CALCIUM 9.0 8.8*   LFT No results for input(s): PROT, ALBUMIN, AST, ALT, ALKPHOS, BILITOT, BILIDIR, IBILI in the last 72 hours. PT/INR No results for input(s): LABPROT, INR in the last 72 hours.  Studies/Results: DG Chest 2 View  Result Date: 07/12/2020 CLINICAL DATA:  Shortness of breath EXAM: CHEST - 2 VIEW COMPARISON:  01/27/2020 FINDINGS: The heart size and mediastinal contours are within normal limits. No focal airspace consolidation, pleural effusion, or pneumothorax. Interval cement augmentation of the T8 vertebral body. Unchanged alignment of thoracic spinal stimulator leads. IMPRESSION: No active cardiopulmonary disease. Electronically Signed   By: Davina Poke D.O.   On: 07/12/2020 14:32    Impression: 1.  Progressive  anemia, normocytic 2.  Heme positive stool without frank GI bleeding 3.  Anticoagulation, including recent introduction of aspirin therapy, for atrial fibrillation status post Watchman device insertion 2 weeks ago 4.  History of gastric antral vascular ectasia, minimal residual as of March 31 when it was most recently treated with argon plasma coagulation therapy  Plan: I feel we should take advantage of the patient's interruption of anticoagulation and do endoscopic evaluation tomorrow, to look for source of progressive blood loss.  The patient is very familiar with upper endoscopy from multiple previous procedures, but the risks were reviewed and she is agreeable to proceed.   LOS: 1 day   [place]  07/13/2020, 9:33 AM   Pager [phone] If no answer or after 5 PM call [phone]

## 2020-07-14 ENCOUNTER — Encounter (HOSPITAL_COMMUNITY): Payer: Self-pay | Admitting: Internal Medicine

## 2020-07-14 ENCOUNTER — Encounter (HOSPITAL_COMMUNITY)
Admission: EM | Disposition: A | Discharge status: Home Health | Payer: Self-pay | Source: Home / Self Care | Attending: Internal Medicine

## 2020-07-14 ENCOUNTER — Inpatient Hospital Stay (HOSPITAL_COMMUNITY): Payer: Medicare PPO | Admitting: Registered Nurse

## 2020-07-14 DIAGNOSIS — E1142 Type 2 diabetes mellitus with diabetic polyneuropathy: Secondary | ICD-10-CM | POA: Diagnosis not present

## 2020-07-14 DIAGNOSIS — I159 Secondary hypertension, unspecified: Secondary | ICD-10-CM | POA: Diagnosis not present

## 2020-07-14 DIAGNOSIS — D62 Acute posthemorrhagic anemia: Secondary | ICD-10-CM | POA: Diagnosis not present

## 2020-07-14 DIAGNOSIS — I4819 Other persistent atrial fibrillation: Secondary | ICD-10-CM

## 2020-07-14 DIAGNOSIS — K922 Gastrointestinal hemorrhage, unspecified: Secondary | ICD-10-CM | POA: Diagnosis not present

## 2020-07-14 HISTORY — PX: ESOPHAGOGASTRODUODENOSCOPY (EGD) WITH PROPOFOL: SHX5813

## 2020-07-14 HISTORY — PX: BIOPSY: SHX5522

## 2020-07-14 HISTORY — PX: HOT HEMOSTASIS: SHX5433

## 2020-07-14 LAB — CBC
HCT: 31 % — ABNORMAL LOW (ref 36.0–46.0)
Hemoglobin: 9.6 g/dL — ABNORMAL LOW (ref 12.0–15.0)
MCH: 29.2 pg (ref 26.0–34.0)
MCHC: 31 g/dL (ref 30.0–36.0)
MCV: 94.2 fL (ref 80.0–100.0)
Platelets: 287 10*3/uL (ref 150–400)
RBC: 3.29 MIL/uL — ABNORMAL LOW (ref 3.87–5.11)
RDW: 15.7 % — ABNORMAL HIGH (ref 11.5–15.5)
WBC: 14.4 10*3/uL — ABNORMAL HIGH (ref 4.0–10.5)
nRBC: 0 % (ref 0.0–0.2)

## 2020-07-14 LAB — GLUCOSE, CAPILLARY
Glucose-Capillary: 169 mg/dL — ABNORMAL HIGH (ref 70–99)
Glucose-Capillary: 184 mg/dL — ABNORMAL HIGH (ref 70–99)
Glucose-Capillary: 111 mg/dL — ABNORMAL HIGH (ref 70–99)
Glucose-Capillary: 111 mg/dL — ABNORMAL HIGH (ref 70–99)
Glucose-Capillary: 235 mg/dL — ABNORMAL HIGH (ref 70–99)

## 2020-07-14 LAB — BASIC METABOLIC PANEL
Anion gap: 10 (ref 5–15)
BUN: 21 mg/dL (ref 8–23)
CO2: 32 mmol/L (ref 22–32)
Calcium: 9 mg/dL (ref 8.9–10.3)
Chloride: 96 mmol/L — ABNORMAL LOW (ref 98–111)
Creatinine, Ser: 0.94 mg/dL (ref 0.44–1.00)
GFR, Estimated: >60 mL/min (ref >60)
Glucose, Bld: 195 mg/dL — ABNORMAL HIGH (ref 70–99)
Potassium: 4.2 mmol/L (ref 3.5–5.1)
Sodium: 138 mmol/L (ref 135–145)

## 2020-07-14 LAB — HEMOGLOBIN AND HEMATOCRIT, BLOOD
HCT: 32.7 % — ABNORMAL LOW (ref 36.0–46.0)
HCT: 28.3 % — ABNORMAL LOW (ref 36.0–46.0)
Hemoglobin: 10 g/dL — ABNORMAL LOW (ref 12.0–15.0)
Hemoglobin: 8.7 g/dL — ABNORMAL LOW (ref 12.0–15.0)

## 2020-07-14 LAB — MAGNESIUM: Magnesium: 1.7 mg/dL (ref 1.7–2.4)

## 2020-07-14 SURGERY — ESOPHAGOGASTRODUODENOSCOPY (EGD) WITH PROPOFOL
Anesthesia: Monitor Anesthesia Care

## 2020-07-14 MED ORDER — MAGNESIUM SULFATE 2 GM/50ML IV SOLN
2.0000 g | Freq: Once | INTRAVENOUS | Status: AC
  Administered 2020-07-14: 2 g via INTRAVENOUS
  Filled 2020-07-14: qty 50

## 2020-07-14 MED ORDER — ONDANSETRON HCL 4 MG/2ML IJ SOLN
INTRAMUSCULAR | Status: DC | PRN
  Administered 2020-07-14: 4 mg via INTRAVENOUS

## 2020-07-14 MED ORDER — ONDANSETRON HCL 4 MG/2ML IJ SOLN
INTRAMUSCULAR | Status: AC
  Filled 2020-07-14: qty 2

## 2020-07-14 MED ORDER — LABETALOL HCL 5 MG/ML IV SOLN
5.0000 mg | INTRAVENOUS | Status: DC | PRN
  Administered 2020-07-14 – 2020-07-15 (×2): 5 mg via INTRAVENOUS
  Filled 2020-07-14 (×2): qty 4

## 2020-07-14 MED ORDER — LIDOCAINE HCL (CARDIAC) PF 100 MG/5ML IV SOSY
PREFILLED_SYRINGE | INTRAVENOUS | Status: DC | PRN
  Administered 2020-07-14: 50 mg via INTRAVENOUS

## 2020-07-14 MED ORDER — ONDANSETRON HCL 4 MG/2ML IJ SOLN
4.0000 mg | Freq: Once | INTRAMUSCULAR | Status: AC
  Administered 2020-07-14: 4 mg via INTRAVENOUS

## 2020-07-14 MED ORDER — SODIUM CHLORIDE 0.9 % IV SOLN: INTRAVENOUS | Status: DC

## 2020-07-14 MED ORDER — METOPROLOL TARTRATE 5 MG/5ML IV SOLN
INTRAVENOUS | Status: DC | PRN
  Administered 2020-07-14 (×2): 1.5 mg via INTRAVENOUS
  Administered 2020-07-14: 2 mg via INTRAVENOUS

## 2020-07-14 MED ORDER — SALINE SPRAY 0.65 % NA SOLN
1.0000 | NASAL | Status: DC | PRN
  Administered 2020-07-14 (×2): 1 via NASAL
  Filled 2020-07-14: qty 44

## 2020-07-14 MED ORDER — PROPOFOL 500 MG/50ML IV EMUL
INTRAVENOUS | Status: DC | PRN
  Administered 2020-07-14: 200 ug/kg/min via INTRAVENOUS

## 2020-07-14 MED ORDER — KETOROLAC TROMETHAMINE 15 MG/ML IJ SOLN
15.0000 mg | Freq: Once | INTRAMUSCULAR | Status: AC
  Administered 2020-07-14: 15 mg via INTRAVENOUS
  Filled 2020-07-14: qty 1

## 2020-07-14 MED ORDER — ACETAMINOPHEN 10 MG/ML IV SOLN
1000.0000 mg | Freq: Once | INTRAVENOUS | Status: AC
  Administered 2020-07-14: 1000 mg via INTRAVENOUS
  Filled 2020-07-14: qty 100

## 2020-07-14 MED ORDER — AMISULPRIDE (ANTIEMETIC) 5 MG/2ML IV SOLN
INTRAVENOUS | Status: AC
  Filled 2020-07-14: qty 4

## 2020-07-14 MED ORDER — LACTATED RINGERS IV SOLN: INTRAVENOUS | Status: DC | PRN

## 2020-07-14 MED ORDER — ALBUMIN HUMAN 5 % IV SOLN
INTRAVENOUS | Status: AC
  Filled 2020-07-14: qty 250

## 2020-07-14 MED ORDER — METOPROLOL TARTRATE 25 MG PO TABS
25.0000 mg | ORAL_TABLET | Freq: Two times a day (BID) | ORAL | Status: DC
  Administered 2020-07-14 – 2020-07-15 (×2): 25 mg via ORAL
  Filled 2020-07-14 (×2): qty 1

## 2020-07-14 MED ORDER — METOPROLOL TARTRATE 5 MG/5ML IV SOLN
5.0000 mg | Freq: Four times a day (QID) | INTRAVENOUS | Status: DC
  Administered 2020-07-14: 5 mg via INTRAVENOUS
  Filled 2020-07-14: qty 5

## 2020-07-14 MED ORDER — ALBUMIN HUMAN 5 % IV SOLN: INTRAVENOUS | Status: DC | PRN

## 2020-07-14 SURGICAL SUPPLY — 14 items

## 2020-07-14 NOTE — Evaluation (Signed)
Physical Therapy Evaluation Patient Details Name: [name] MRN: [id number] DOB: 27-Jul-1941 Today's Date: 07/14/2020   History of Present Illness  [name] is a 79 y.o. female with medical history significant of PAF on Eliquis, h/o GI bleeds, remote h/o asthma -  DM2, GERD, HTN.  S/P  Watchman procedure in order to come off chronic anti-coagulation, done 07/01/20. Admitted to [facility] 07/12/20  increased SOB/DOE, chest tightness and wheezing, drop in HGB, received 2 units of blood.  Clinical Impression  The patient reports feeling nausea with any motion. Patient did sit up on bed edge and stood with HHA and took small side steps. Patient waiting For EGD today. Patient should progress tho return  Home with spouse.  HR note up to 140 while mobilizing.  Pt admitted with above diagnosis. Pt currently with functional limitations due to the deficits listed below (see PT Problem List). Pt will benefit from skilled PT to increase their independence and safety with mobility to allow discharge to the venue listed below.       Follow Up Recommendations Home health PT;Supervision/Assistance - 24 hour    Equipment Recommendations  None recommended by PT    Recommendations for Other Services       Precautions / Restrictions Precautions Precautions: Fall Precaution Comments: multiple due to neuropathy      Mobility  Bed Mobility Overal bed mobility: Needs Assistance Bed Mobility: Supine to Sit;Sit to Supine     Supine to sit: Min assist Sit to supine: Min guard   General bed mobility comments: use of bed rail, reports nausea when moving    Transfers Overall transfer level: Needs assistance Equipment used: 2 person hand held assist Transfers: Sit to/from Stand Sit to Stand: Min assist         General transfer comment: steady assist to rise, 3 side steps along the bed.  Ambulation/Gait             General Gait Details: TBA  Stairs            Wheelchair  Mobility    Modified Rankin (Stroke Patients Only)       Balance Overall balance assessment: History of Falls;Mild deficits observed, not formally tested;Needs assistance Sitting-balance support: Feet supported;Bilateral upper extremity supported Sitting balance-Leahy Scale: Good     Standing balance support: Bilateral upper extremity supported;During functional activity Standing balance-Leahy Scale: Poor Standing balance comment: reliant on UE                             Pertinent Vitals/Pain Pain Assessment: No/denies pain    Home Living Family/patient expects to be discharged to:: Private residence Living Arrangements: Spouse/significant other Available Help at Discharge: Family Type of Home: House Home Access: Stairs to enter Entrance Stairs-Rails: Psychiatric nurse of Steps: 3 Home Layout: One level Home Equipment: Environmental consultant - 2 wheels      Prior Function Level of Independence: Needs assistance   Gait / Transfers Assistance Needed: uses RW when needed  ADL's / Homemaking Assistance Needed: spouse assists PRN        Hand Dominance        Extremity/Trunk Assessment   Upper Extremity Assessment Upper Extremity Assessment: Generalized weakness    Lower Extremity Assessment Lower Extremity Assessment: Generalized weakness;LLE deficits/detail;RLE deficits/detail RLE Sensation: history of peripheral neuropathy LLE Sensation: history of peripheral neuropathy       Communication      Cognition Arousal/Alertness: Awake/alert Behavior  During Therapy: WFL for tasks assessed/performed Overall Cognitive Status: Within Functional Limits for tasks assessed                                        General Comments      Exercises     Assessment/Plan    PT Assessment Patient needs continued PT services  PT Problem List Decreased strength;Decreased activity tolerance;Decreased mobility;Decreased balance;Decreased  knowledge of precautions;Cardiopulmonary status limiting activity       PT Treatment Interventions DME instruction;Gait training;Functional mobility training;Therapeutic activities;Therapeutic exercise;Patient/family education    PT Goals (Current goals can be found in the Care Plan section)  Acute Rehab PT Goals Patient Stated Goal: to not be nauseated when moving PT Goal Formulation: With patient/family Time For Goal Achievement: 07/28/20 Potential to Achieve Goals: Good    Frequency Min 3X/week   Barriers to discharge        Co-evaluation               AM-PAC PT "6 Clicks" Mobility  Outcome Measure Help needed turning from your back to your side while in a flat bed without using bedrails?: A Little Help needed moving from lying on your back to sitting on the side of a flat bed without using bedrails?: A Little Help needed moving to and from a bed to a chair (including a wheelchair)?: A Little Help needed standing up from a chair using your arms (e.g., wheelchair or bedside chair)?: A Little Help needed to walk in hospital room?: A Lot Help needed climbing 3-5 steps with a railing? : A Lot 6 Click Score: 16    End of Session   Activity Tolerance: Treatment limited secondary to medical complications (Comment) (nausea) Patient left: in bed;with call bell/phone within reach;with family/visitor present Nurse Communication: Mobility status PT Visit Diagnosis: Unsteadiness on feet (R26.81);Difficulty in walking, not elsewhere classified (R26.2);History of falling (Z91.81)    Time: 3094-0768 PT Time Calculation (min) (ACUTE ONLY): 24 min   Charges:   PT Evaluation $PT Eval Low Complexity: 1 Low PT Treatments $Therapeutic Activity: 8-22 mins        Tresa Endo PT Acute Rehabilitation Services Pager [phone] Office [phone]   Claretha Cooper 07/14/2020, 11:43 AM

## 2020-07-14 NOTE — Interval H&P Note (Signed)
History and Physical Interval Note:  07/14/2020 3:18 PM  [name]  has presented today for surgery, with the diagnosis of gi bleed.  The various methods of treatment have been discussed with the patient and family. After consideration of risks, benefits and other options for treatment, the patient has consented to  Procedure(s): ESOPHAGOGASTRODUODENOSCOPY (EGD) WITH PROPOFOL (N/A) as a surgical intervention.  The patient's history has been reviewed, patient examined, no change in status, stable for surgery.  I have reviewed the patient's chart and labs.  Questions were answered to the patient's satisfaction.     Youlanda Mighty [name]

## 2020-07-14 NOTE — Progress Notes (Signed)
Progress Note  Patient Name: [name] Date of Encounter: 07/14/2020  [facility] Cardiologist: Candee Furbish, MD   Subjective   No chest pain or SOB, + awareness of rapid HR, + nausea, overall just does not feel well.    Inpatient Medications    Scheduled Meds:  sodium chloride   Intravenous Once   ALPRAZolam  0.25 mg Oral QHS   docusate sodium  100 mg Oral BID   insulin aspart  0-15 Units Subcutaneous TID WC   insulin glargine  30 Units Subcutaneous QHS   metoprolol tartrate  5 mg Intravenous Q6H   [START ON 07/16/2020] pantoprazole  40 mg Intravenous Q12H   pravastatin  20 mg Oral q1800   venlafaxine  37.5 mg Oral BID   Continuous Infusions:  sodium chloride Stopped (07/13/20 0845)   pantoprozole (PROTONIX) infusion 8 mg/hr (07/14/20 0742)   PRN Meds: acetaminophen, albuterol, labetalol, methocarbamol, ondansetron (ZOFRAN) IV **OR** ondansetron   Vital Signs    Vitals:   07/13/20 2259 07/14/20 0244 07/14/20 0616 07/14/20 1037  BP: (!) 163/93 (!) 142/82 116/70 (!) 155/76  Pulse: (!) 127 (!) 102 (!) 109 (!) 128  Resp: (!) 24 16 18    Temp: 98.1 F (36.7 C) 98.5 F (36.9 C) 98.3 F (36.8 C) 98.3 F (36.8 C)  TempSrc:  Oral Oral   SpO2: 98% 99% 100% 99%  Weight:      Height:        Intake/Output Summary (Last 24 hours) at 07/14/2020 1109 Last data filed at 07/14/2020 0600 Gross per 24 hour  Intake 1035.32 ml  Output 900 ml  Net 135.32 ml   Last 3 Weights 07/12/2020 07/01/2020 06/29/2020  Weight (lbs) 157 lb 6.5 oz 155 lb 153 lb 3.2 oz  Weight (kg) 71.4 kg 70.308 kg 69.491 kg      Telemetry    Atrial fib RVR from 97 to 140,  Was in 90s then I had her sit up to 140.- Personally Reviewed  ECG    No new - Personally Reviewed  Physical Exam   GEN: No acute distress.   Neck: No JVD Cardiac: irreg irreg, no murmurs, rubs, or gallops.  Respiratory: Clear to auscultation bilaterally. GI: Soft, nontender, non-distended  MS: No  edema; No deformity. Neuro:  Nonfocal  Psych: Normal affect   Labs    High Sensitivity Troponin:   Recent Labs  Lab 07/12/20 1626  TROPONINIHS 4      Chemistry Recent Labs  Lab 07/12/20 1626 07/13/20 0418 07/14/20 0417  NA 137 137 138  K 4.4 4.9 4.2  CL 100 100 96*  CO2 27 26 32  GLUCOSE 122* 253* 195*  BUN 27* 27* 21  CREATININE 1.14* 1.02* 0.94  CALCIUM 9.0 8.8* 9.0  GFRNONAA 49* 56* >60  ANIONGAP 10 11 10      Hematology Recent Labs  Lab 07/12/20 1626 07/12/20 2153 07/13/20 2133 07/14/20 0417 07/14/20 0937  WBC 13.5*  --   --  14.4*  --   RBC 2.83*  --   --  3.29*  --   HGB 8.2*   < > 9.6* 9.6* 10.0*  HCT 27.6*   < > 30.7* 31.0* 32.7*  MCV 97.5  --   --  94.2  --   MCH 29.0  --   --  29.2  --   MCHC 29.7*  --   --  31.0  --   RDW 15.9*  --   --  15.7*  --  PLT 356  --   --  287  --    < > = values in this interval not displayed.    BNPNo results for input(s): BNP, PROBNP in the last 168 hours.   DDimer No results for input(s): DDIMER in the last 168 hours.   Radiology    DG Chest 2 View  Result Date: 07/12/2020 CLINICAL DATA:  Shortness of breath EXAM: CHEST - 2 VIEW COMPARISON:  01/27/2020 FINDINGS: The heart size and mediastinal contours are within normal limits. No focal airspace consolidation, pleural effusion, or pneumothorax. Interval cement augmentation of the T8 vertebral body. Unchanged alignment of thoracic spinal stimulator leads. IMPRESSION: No active cardiopulmonary disease. Electronically Signed   By: Davina Poke D.O.   On: 07/12/2020 14:32   ECHOCARDIOGRAM COMPLETE  Result Date: 07/13/2020    ECHOCARDIOGRAM REPORT   Patient Name:   [name] Date of Exam: 07/13/2020 Medical Rec #:  [id number]        Height:       67.0 in Accession #:    [id number]       Weight:       157.4 lb Date of Birth:  12-Sep-1940         BSA:          1.827 m Patient Age:    71 years         BP:           123/70 mmHg Patient Gender: F                 HR:           101 bpm. Exam Location:  Inpatient Procedure: 2D Echo, Color Doppler and Cardiac Doppler Indications:    K24.09 Acute systolic (congestive) heart failure  History:        Patient has prior history of Echocardiogram examinations, most                 recent 07/07/2020. Arrythmias:Atrial Fibrillation; Risk                 Factors:Hypertension, Diabetes and Dyslipidemia. 73mm Watchman                 FLX placed 07/01/20.  Sonographer:    Raquel Sarna Senior RDCS Referring Phys: [place]  1. Left ventricular ejection fraction, by estimation, is 55 to 60%. The left ventricle has normal function. The left ventricle has no regional wall motion abnormalities. There is mild asymmetric left ventricular hypertrophy of the posterior-lateral segment. Left ventricular diastolic parameters are indeterminate. Elevated left ventricular end-diastolic pressure.  2. Right ventricular systolic function is mildly reduced. The right ventricular size is mildly enlarged. There is normal pulmonary artery systolic pressure. The estimated right ventricular systolic pressure is 73.5 mmHg.  3. Left atrial size was moderately dilated.  4. Right atrial size was severely dilated.  5. The mitral valve is abnormal. Trivial mitral valve regurgitation. Moderate mitral annular calcification.  6. Tricuspid valve regurgitation is mild to moderate.  7. The aortic valve is grossly normal. Aortic valve regurgitation is not visualized. No aortic stenosis is present.  8. The inferior vena cava is normal in size with greater than 50% respiratory variability, suggesting right atrial pressure of 3 mmHg.  9. S/p 27 mm Watchman FLX 07/01/20, which is not well visualized or assessed on this transthoracic echo. FINDINGS  Left Ventricle: Left ventricular ejection fraction, by estimation, is 55 to 60%. The left ventricle has  normal function. The left ventricle has no regional wall motion abnormalities. The left ventricular internal cavity  size was normal in size. There is  mild asymmetric left ventricular hypertrophy of the posterior-lateral segment. Left ventricular diastolic parameters are indeterminate. Elevated left ventricular end-diastolic pressure. Right Ventricle: The right ventricular size is mildly enlarged. Right vetricular wall thickness was not well visualized. Right ventricular systolic function is mildly reduced. There is normal pulmonary artery systolic pressure. The tricuspid regurgitant velocity is 2.79 m/s, and with an assumed right atrial pressure of 3 mmHg, the estimated right ventricular systolic pressure is 85.6 mmHg. Left Atrium: Left atrial size was moderately dilated. Right Atrium: Right atrial size was severely dilated. Pericardium: Trivial pericardial effusion is present. Mitral Valve: The mitral valve is abnormal. There is mild calcification of the mitral valve leaflet(s). Moderate mitral annular calcification. Trivial mitral valve regurgitation. Tricuspid Valve: The tricuspid valve is grossly normal. Tricuspid valve regurgitation is mild to moderate. Aortic Valve: The aortic valve is grossly normal. Aortic valve regurgitation is not visualized. No aortic stenosis is present. Pulmonic Valve: The pulmonic valve was not well visualized. Pulmonic valve regurgitation is trivial. Aorta: The aortic root and ascending aorta are structurally normal, with no evidence of dilitation. Venous: The inferior vena cava is normal in size with greater than 50% respiratory variability, suggesting right atrial pressure of 3 mmHg. IAS/Shunts: The interatrial septum was not well visualized.  LEFT VENTRICLE PLAX 2D LVIDd:         3.63 cm LVIDs:         2.90 cm LV PW:         1.14 cm LV IVS:        0.91 cm LVOT diam:     2.00 cm LV SV:         65 LV SV Index:   36 LVOT Area:     3.14 cm  RIGHT VENTRICLE RV S prime:     10.60 cm/s TAPSE (M-mode): 1.9 cm LEFT ATRIUM             Index       RIGHT ATRIUM           Index LA diam:        3.60 cm 1.97  cm/m  RA Area:     23.80 cm LA Vol (A2C):   65.4 ml 35.81 ml/m RA Volume:   88.20 ml  48.29 ml/m LA Vol (A4C):   64.3 ml 35.20 ml/m LA Biplane Vol: 74.3 ml 40.68 ml/m  AORTIC VALVE LVOT Vmax:   95.30 cm/s LVOT Vmean:  70.500 cm/s LVOT VTI:    0.207 m  AORTA Ao Root diam: 2.90 cm Ao Asc diam:  3.00 cm TRICUSPID VALVE TR Peak grad:   31.1 mmHg TR Vmax:        279.00 cm/s  SHUNTS Systemic VTI:  0.21 m Systemic Diam: 2.00 cm Cherlynn Kaiser MD Electronically signed by Cherlynn Kaiser MD Signature Date/Time: 07/13/2020/1:29:03 PM    Final     Cardiac Studies   Echo 07/13/20 IMPRESSIONS    1. Left ventricular ejection fraction, by estimation, is 55 to 60%. The  left ventricle has normal function. The left ventricle has no regional  wall motion abnormalities. There is mild asymmetric left ventricular  hypertrophy of the posterior-lateral  segment. Left ventricular diastolic parameters are indeterminate. Elevated  left ventricular end-diastolic pressure.  2. Right ventricular systolic function is mildly reduced. The right  ventricular size is mildly enlarged. There is normal  pulmonary artery  systolic pressure. The estimated right ventricular systolic pressure is  70.2 mmHg.  3. Left atrial size was moderately dilated.  4. Right atrial size was severely dilated.  5. The mitral valve is abnormal. Trivial mitral valve regurgitation.  Moderate mitral annular calcification.  6. Tricuspid valve regurgitation is mild to moderate.  7. The aortic valve is grossly normal. Aortic valve regurgitation is not  visualized. No aortic stenosis is present.  8. The inferior vena cava is normal in size with greater than 50%  respiratory variability, suggesting right atrial pressure of 3 mmHg.  9. S/p 27 mm Watchman FLX 07/01/20, which is not well visualized or  assessed on this transthoracic echo.   FINDINGS  Left Ventricle: Left ventricular ejection fraction, by estimation, is 55  to 60%. The  left ventricle has normal function. The left ventricle has no  regional wall motion abnormalities. The left ventricular internal cavity  size was normal in size. There is  mild asymmetric left ventricular hypertrophy of the posterior-lateral  segment. Left ventricular diastolic parameters are indeterminate. Elevated  left ventricular end-diastolic pressure.   Trivial pericardial effusion.   Patient Profile     79 y.o. female  with a hx of PAF, DM, HTN, recurrent GIB 2016, GERD, OA, asthma, now with GIB and hx of watchman 07/01/2020.   Assessment & Plan    1. Acute diastolic CHF - pt w/ JVD and SOB, but NAD on CXR - echo with trivial pericardial effusion yesterday - 11/10 echo w/ ASD from septal puncture, trivial effusion, EF 60-65% -Mg+ 1.7  --I&0 +230  Lasix 20 every 12 hours and rec'd 40 yesterday as well.    2. Persistent Afib - HR is high-normal - pt asymptomatic regarding palpitations, unclear if the Afib is contributing to fatigue, but this is more likely from anemia - Lopressor increased from 12.5 >> 25 mg bid held for procedure.  - she had gotten prednisone 60 mg x 1 last pm for her wheezing - TSH has been ok in the past, with recurrent Afib and fatigue, will recheck - cannot anticoagulate at this time,  - now on IV lopressor since NPO 5 mg but may need every 4 hours instead of 6 hours BP is stable  MD to address.   3. GIB, anemia (hgb today 9.6 to 10) - has had antral ulcers in the past 2nd gastric antral vascular ectasia, s/p RFA, argon plasma coagulation therapy - GI has seen, plans EGD today was to do early but now at 4 pm  -  2 U PRBCs yesterday, Lasix  40 mg x 1 after 2nd unit - per IM/GI      For questions or updates, please contact [facility] Please consult [email or web address] for contact info under        Signed, Cecilie Kicks, NP  07/14/2020, 11:09 AM

## 2020-07-14 NOTE — Progress Notes (Signed)
Stable from GI standpoint but c/o severe nausea.  Pt advised of prn Zofran availability.  EGD has gotten moved back to approx 4 pm.   Have messaged Hospitalist and Cardiologist since this may affect dischg and resumption of anticoagulation (normally, unless problems/findings during egd, pt should be able to be dischg'd and resume Eliquis post procedure).  Hgb stable overnight.  Cleotis Nipper, M.D. Pager [phone] If no answer or after 5 PM call [phone]

## 2020-07-14 NOTE — Progress Notes (Signed)
Patient's EGD was well-tolerated.  There were 2 main findings: Desquamating distal esophagitis (most likely, this is reflective of recent erosive esophagitis, now in the resolving phase), and gastric antral vascular ectasia, as previously noted in this patient.  There was fresh blood, without active bleeding, overlying 1 of these lesions.  Argon plasma coagulation therapy was performed.  Discussed with Dr. Wyline Copas.    Pertinent information from Dr. Clarene Essex which I just received, would recommend holding Eliquis until Friday morning.  [name], M.D. Pager [phone] If no answer or after 5 PM call [phone]

## 2020-07-14 NOTE — Op Note (Signed)
[facility] Patient Name: [name] Procedure Date: 07/14/2020 MRN: [id number] Attending MD: Ronald Lobo , MD Date of Birth: 1941-05-03 CSN: [id number] Age: 79 Admit Type: Inpatient Procedure:                Upper GI endoscopy Indications:              Acute post hemorrhagic anemia, Heme positive stool                            in a patient with known GAVE last APC'd 10/2019 (Dr.                            Watt Climes) and acute drop in hemoglobin, on chronic                            anticoagulation Providers:                Ronald Lobo, MD, Glori Bickers, RN, Laverda Sorenson, Technician, Benetta Spar, Technician,                            Enrigue Catena, CRNA Referring MD:              Medicines:                Monitored Anesthesia Care Complications:            No immediate complications. Estimated Blood Loss:     Estimated blood loss was minimal. Procedure:                Pre-Anesthesia Assessment:                           - Prior to the procedure, a History and Physical                            was performed, and patient medications and                            allergies were reviewed. The patient's tolerance of                            previous anesthesia was also reviewed. The risks                            and benefits of the procedure and the sedation                            options and risks were discussed with the patient.                            All questions were answered, and informed consent  was obtained. Prior Anticoagulants: The patient has                            taken Eliquis (apixaban), last dose was 3 days                            prior to procedure. ASA Grade Assessment: IV - A                            patient with severe systemic disease that is a                            constant threat to life. After reviewing the risks                            and benefits,  the patient was deemed in                            satisfactory condition to undergo the procedure.                           After obtaining informed consent, the endoscope was                            passed under direct vision. Throughout the                            procedure, the patient's blood pressure, pulse, and                            oxygen saturations were monitored continuously. The                            GIF-H190 ([id number]) Olympus gastroscope was                            introduced through the mouth, and advanced to the                            second part of duodenum. The upper GI endoscopy was                            accomplished without difficulty. The patient                            tolerated the procedure well. Scope In: Scope Out: Findings:      Moderately severe resolving distal esophagitis, characterized by       desquamating squamous mucosa with no bleeding was found. Biopsies were       taken with a cold forceps for histology. Estimated blood loss was       minimal.      The exam of the esophagus was otherwise normal. (Small hiatal hernia       present.)  A small glob of fresh semi-clotted blood was found in the gastric       antrum; after rinsing it away, underneath was noted an area of vascular       ectasia, which was not actively oozing . Coagulation for bleeding       prevention using argon plasma (gastric setting) was successful.       Estimated blood loss: none.      A few other angioectasias without stigmata of recent bleeding were found       in the gastric antrum and were also APC'd.      The exam of the stomach was otherwise normal.      The cardia and gastric fundus were normal on retroflexion.      The examined duodenum was normal. Impression:               - Resovling, moderately severe desquamating reflux                            esophagitis with no bleeding. Biopsied.                           - Gastric antral  vascular ectasia (as previously                            noted), with focal clotted blood overlying one                            lesion in the gastric antrum. Treated with argon                            plasma coagulation (APC).                           - A few non-bleeding angioectasias in the stomach,                            also treated with APC.                           - Normal examined duodenum. Moderate Sedation:      This patient was sedated with monitored anesthesia care, not moderate       sedation. Recommendation:           - Await pathology results.                           - Observe patient's clinical course following                            today's procedure with therapeutic intervention.                           - Resume Eliquis (apixaban) in 2 days. Procedure Code(s):        --- Professional ---                           74081, 59, Esophagogastroduodenoscopy, flexible,  transoral; with control of bleeding, any method                           43239, Esophagogastroduodenoscopy, flexible,                            transoral; with biopsy, single or multiple Diagnosis Code(s):        --- Professional ---                           K21.00, Gastro-esophageal reflux disease with                            esophagitis, without bleeding                           K92.2, Gastrointestinal hemorrhage, unspecified                           K31.811, Angiodysplasia of stomach and duodenum                            with bleeding                           D62, Acute posthemorrhagic anemia                           R19.5, Other fecal abnormalities CPT copyright 2019 [employer]. All rights reserved. The codes documented in this report are preliminary and upon coder review may  be revised to meet current compliance requirements. Ronald Lobo, MD 07/14/2020 5:05:58 PM This report has been signed electronically. Number of  Addenda: 0

## 2020-07-14 NOTE — Progress Notes (Signed)
   07/14/20 1037  Assess: MEWS Score  Temp 98.3 F (36.8 C)  BP (!) 155/76  Pulse Rate (!) 128  Resp 16  Level of Consciousness Alert  SpO2 99 %  O2 Device Nasal Cannula  O2 Flow Rate (L/min) 2 L/min  Assess: MEWS Score  MEWS Temp 0  MEWS Systolic 0  MEWS Pulse 2  MEWS RR 0  MEWS LOC 0  MEWS Score 2  MEWS Score Color Yellow  Assess: if the MEWS score is Yellow or Red  Were vital signs taken at a resting state? Yes  Focused Assessment No change from prior assessment  Early Detection of Sepsis Score *See Row Information* Low  MEWS guidelines implemented *See Row Information* Yes  Treat  MEWS Interventions Escalated (See documentation below)  Take Vital Signs  Increase Vital Sign Frequency  Yellow: Q 2hr X 2 then Q 4hr X 2, if remains yellow, continue Q 4hrs  Escalate  MEWS: Escalate Yellow: discuss with charge nurse/RN and consider discussing with provider and RRT  Notify: Charge Nurse/RN  Name of Charge Nurse/RN Notified Raynelle Jan RN  Date Charge Nurse/RN Notified 07/14/20  Time Charge Nurse/RN Notified 1037

## 2020-07-14 NOTE — Anesthesia Procedure Notes (Signed)
Procedure Name: MAC Date/Time: 07/14/2020 4:14 PM Performed by: Lissa Morales, CRNA Pre-anesthesia Checklist: Patient identified, Emergency Drugs available, Suction available, Patient being monitored and Timeout performed Patient Re-evaluated:Patient Re-evaluated prior to induction Oxygen Delivery Method: Simple face mask Placement Confirmation: positive ETCO2 Comments: POM mask

## 2020-07-14 NOTE — Transfer of Care (Signed)
Immediate Anesthesia Transfer of Care Note  Patient: [name]  Procedure(s) Performed: ESOPHAGOGASTRODUODENOSCOPY (EGD) WITH PROPOFOL (N/A ) HOT HEMOSTASIS (ARGON PLASMA COAGULATION/BICAP) (N/A ) BIOPSY  Patient Location: PACU  Anesthesia Type:MAC  Level of Consciousness: awake, alert , oriented and patient cooperative  Airway & Oxygen Therapy: Patient Spontanous Breathing and Patient connected to face mask oxygen  Post-op Assessment: Report given to RN, Post -op Vital signs reviewed and stable and Patient moving all extremities X 4  Post vital signs: stable  Last Vitals:  Vitals Value Taken Time  BP 143/83 07/14/20 1650  Temp    Pulse 119 07/14/20 1651  Resp 20 07/14/20 1651  SpO2 100 % 07/14/20 1651  Vitals shown include unvalidated device data.  Last Pain:  Vitals:   07/14/20 1646  TempSrc: Oral  PainSc: 0-No pain      Patients Stated Pain Goal: 4 (29/04/75 3391)  Complications: No complications documented.

## 2020-07-14 NOTE — Progress Notes (Signed)
PROGRESS NOTE    [name]  WUJ:[id number] DOB: 09/27/1940 DOA: 07/12/2020 PCP: Leighton Ruff, MD    Brief Narrative:  79 y.o.femalewith medical history significant ofPAF on Eliquis, h/o GI bleeds, remote h/o asthma - with no flares or need for medication for several years, DM2, GERD, HTN.  Due to high risk for recurrent GI bleeding she underwent Watchman procedure in order to come off chronic anti-coagulation, done 07/01/20. Over the past several days she has had increased SOB/DOE, chest tightness and wheezing. Due to her symptoms she presents to [facility] for evaluation. She denies Abdominal pain, chest pain, denies any frank bleeding, or productive cough  Shortly after presentation, pt was noted to have acute drop in hgb with concern for upper GI bleed. Pt received 2 units of PRBC's and GI consulted. Cardiology was consulted as well given rapid afib  Assessment & Plan:   Active Problems:   Asthma, chronic   HTN (hypertension)   DM type 2 with diabetic peripheral neuropathy (HCC)   Acute GI bleeding   Acute blood loss anemia   Persistent atrial fibrillation (HCC)   SOB (shortness of breath)   ABLA (acute blood loss anemia)  Acute blood loss anemia with presumed upper GI bleed Baseline hemoglobin noted to be around 12 Presented with hemoglobin of 8.2 with pos occult stool Her hemoglobin has been downtrending, 7.0 on 07/13/2020. 2 units PRBC was transfused GI was consulted and pt is pending EGD today  Presumed upper GI bleed Last endoscopy, EGD was done on 11/26/2019 by Dr. Watt Climes which showed:Gastric antral vascular ectasia without bleeding. Treated with radiofrequency ablation. Continue Protonix drip GI now following, planning egd today  Dyspnea, suspect secondary to symptomatic anemia Presented with worsening dyspnea on exertion.  Denies chest pain. Dr. [place] Crane reviewed her chest x-ray done on 07/12/2020, no active disease, no infiltrates, no increase in pulmonary  vascularity, no evidence of pulmonary edema or pleural effusions.  Paroxysmal A. Fib Intermittent tachycardia noted this morning Continued on home p.o. Lopressor at lower doses 25 mg twice daily due to suspected active upper GI bleeding Monitor rate on telemetry Eliquis on hold due to acute blood loss anemia with suspected upper GI bleed  Asthma, no evidence of an asthma exacerbation at the time of this visit Bronchodilators as needed Maintain O2 saturation greater than 92%. Currently with minimal O2 support  Essential hypertension Now on metoprolol PO 25mg , discussed with Cardiology BP suboptimally controlled this AM.  Diastolic NFA/21-HYQ postop watchman procedure Euvolemic on exam Received 2 doses of Lasix IV 20 mg 2D echo has been completed, follow results. Strict I's and O's and daily weight Cardiology following, appreciate input  DVT prophylaxis: SCD's Code Status: Full Family Communication: Pt in room, family at bedside  Status is: Inpatient  Remains inpatient appropriate because:Ongoing diagnostic testing needed not appropriate for outpatient work up and Unsafe d/c plan   Dispo:  Patient From: Home  Planned Disposition: Home  Expected discharge date: 07/16/20  Medically stable for discharge: No        Consultants:   GI  Cardiology  Procedures:     Antimicrobials: Anti-infectives (From admission, onward)   None       Subjective: Complaining of palpitations and some nausea this AM  Objective: Vitals:   07/14/20 0244 07/14/20 0616 07/14/20 1037 07/14/20 1234  BP: (!) 142/82 116/70 (!) 155/76 138/76  Pulse: (!) 102 (!) 109 (!) 128 (!) 126  Resp: 16 18  17   Temp: 98.5 F (36.9 C)  98.3 F (36.8 C) 98.3 F (36.8 C) 98.4 F (36.9 C)  TempSrc: Oral Oral  Oral  SpO2: 99% 100% 99% 100%  Weight:      Height:        Intake/Output Summary (Last 24 hours) at 07/14/2020 1437 Last data filed at 07/14/2020 0600 Gross per 24 hour  Intake  425.32 ml  Output 250 ml  Net 175.32 ml   Filed Weights   07/12/20 2230  Weight: 71.4 kg    Examination:  General exam: Appears calm and comfortable  Respiratory system: Clear to auscultation. Respiratory effort normal. Cardiovascular system: S1 & S2 heard, tachycardic Gastrointestinal system: Abdomen is nondistended, soft and nontender. No organomegaly or masses felt. Normal bowel sounds heard. Central nervous system: Alert and oriented. No focal neurological deficits. Extremities: Symmetric 5 x 5 power. Skin: No rashes, lesions Psychiatry: Judgement and insight appear normal. Mood & affect appropriate.   Data Reviewed: I have personally reviewed following labs and imaging studies  CBC: Recent Labs  Lab 07/12/20 1626 07/12/20 1626 07/12/20 2153 07/13/20 0418 07/13/20 2133 07/14/20 0417 07/14/20 0937  WBC 13.5*  --   --   --   --  14.4*  --   HGB 8.2*   < > 7.3* 7.0* 9.6* 9.6* 10.0*  HCT 27.6*   < > 24.4* 23.8* 30.7* 31.0* 32.7*  MCV 97.5  --   --   --   --  94.2  --   PLT 356  --   --   --   --  287  --    < > = values in this interval not displayed.   Basic Metabolic Panel: Recent Labs  Lab 07/12/20 1626 07/13/20 0418 07/14/20 0417  NA 137 137 138  K 4.4 4.9 4.2  CL 100 100 96*  CO2 27 26 32  GLUCOSE 122* 253* 195*  BUN 27* 27* 21  CREATININE 1.14* 1.02* 0.94  CALCIUM 9.0 8.8* 9.0  MG  --   --  1.7   GFR: Estimated Creatinine Clearance: 47.2 mL/min (by C-G formula based on SCr of 0.94 mg/dL). Liver Function Tests: No results for input(s): AST, ALT, ALKPHOS, BILITOT, PROT, ALBUMIN in the last 168 hours. No results for input(s): LIPASE, AMYLASE in the last 168 hours. No results for input(s): AMMONIA in the last 168 hours. Coagulation Profile: No results for input(s): INR, PROTIME in the last 168 hours. Cardiac Enzymes: No results for input(s): CKTOTAL, CKMB, CKMBINDEX, TROPONINI in the last 168 hours. BNP (last 3 results) No results for input(s):  PROBNP in the last 8760 hours. HbA1C: No results for input(s): HGBA1C in the last 72 hours. CBG: Recent Labs  Lab 07/13/20 1154 07/13/20 1655 07/13/20 2225 07/14/20 0746 07/14/20 1143  GLUCAP 152* 167* 151* 169* 184*   Lipid Profile: No results for input(s): CHOL, HDL, LDLCALC, TRIG, CHOLHDL, LDLDIRECT in the last 72 hours. Thyroid Function Tests: No results for input(s): TSH, T4TOTAL, FREET4, T3FREE, THYROIDAB in the last 72 hours. Anemia Panel: No results for input(s): VITAMINB12, FOLATE, FERRITIN, TIBC, IRON, RETICCTPCT in the last 72 hours. Sepsis Labs: No results for input(s): PROCALCITON, LATICACIDVEN in the last 168 hours.  Recent Results (from the past 240 hour(s))  Respiratory Panel by RT PCR (Flu A&B, Covid) - Nasopharyngeal Swab     Status: None   Collection Time: 07/12/20  6:15 PM   Specimen: Nasopharyngeal Swab  Result Value Ref Range Status   SARS Coronavirus 2 by RT PCR NEGATIVE NEGATIVE Final  Comment: (NOTE) SARS-CoV-2 target nucleic acids are NOT DETECTED.  The SARS-CoV-2 RNA is generally detectable in upper respiratoy specimens during the acute phase of infection. The lowest concentration of SARS-CoV-2 viral copies this assay can detect is 131 copies/mL. A negative result does not preclude SARS-Cov-2 infection and should not be used as the sole basis for treatment or other patient management decisions. A negative result may occur with  improper specimen collection/handling, submission of specimen other than nasopharyngeal swab, presence of viral mutation(s) within the areas targeted by this assay, and inadequate number of viral copies (<131 copies/mL). A negative result must be combined with clinical observations, patient history, and epidemiological information. The expected result is Negative.  Fact Sheet for Patients:  PinkCheek.be  Fact Sheet for Healthcare Providers:   GravelBags.it  This test is no t yet approved or cleared by the [place] FDA and  has been authorized for detection and/or diagnosis of SARS-CoV-2 by FDA under an Emergency Use Authorization (EUA). This EUA will remain  in effect (meaning this test can be used) for the duration of the COVID-19 declaration under Section 564(b)(1) of the Act, 21 U.S.C. section 360bbb-3(b)(1), unless the authorization is terminated or revoked sooner.     Influenza A by PCR NEGATIVE NEGATIVE Final   Influenza B by PCR NEGATIVE NEGATIVE Final    Comment: (NOTE) The Xpert Xpress SARS-CoV-2/FLU/RSV assay is intended as an aid in  the diagnosis of influenza from Nasopharyngeal swab specimens and  should not be used as a sole basis for treatment. Nasal washings and  aspirates are unacceptable for Xpert Xpress SARS-CoV-2/FLU/RSV  testing.  Fact Sheet for Patients: PinkCheek.be  Fact Sheet for Healthcare Providers: GravelBags.it  This test is not yet approved or cleared by the [place] FDA and  has been authorized for detection and/or diagnosis of SARS-CoV-2 by  FDA under an Emergency Use Authorization (EUA). This EUA will remain  in effect (meaning this test can be used) for the duration of the  Covid-19 declaration under Section 564(b)(1) of the Act, 21  U.S.C. section 360bbb-3(b)(1), unless the authorization is  terminated or revoked. Performed at [facility], [place] [address] [place], [place] [address]      Radiology Studies: ECHOCARDIOGRAM COMPLETE  Result Date: 07/13/2020    ECHOCARDIOGRAM REPORT   Patient Name:   PRISILLA KOCSIS Date of Exam: 07/13/2020 Medical Rec #:  [id number]        Height:       67.0 in Accession #:    [id number]       Weight:       157.4 lb Date of Birth:  [birth year]         BSA:          1.827 m Patient Age:    23 years         BP:           123/70 mmHg  Patient Gender: F                HR:           101 bpm. Exam Location:  Inpatient Procedure: 2D Echo, Color Doppler and Cardiac Doppler Indications:    N98.92 Acute systolic (congestive) heart failure  History:        Patient has prior history of Echocardiogram examinations, most                 recent 07/07/2020. Arrythmias:Atrial Fibrillation; Risk  Factors:Hypertension, Diabetes and Dyslipidemia. 41mm Watchman                 FLX placed 07/01/20.  Sonographer:    Raquel Sarna Senior RDCS Referring Phys: [place]  1. Left ventricular ejection fraction, by estimation, is 55 to 60%. The left ventricle has normal function. The left ventricle has no regional wall motion abnormalities. There is mild asymmetric left ventricular hypertrophy of the posterior-lateral segment. Left ventricular diastolic parameters are indeterminate. Elevated left ventricular end-diastolic pressure.  2. Right ventricular systolic function is mildly reduced. The right ventricular size is mildly enlarged. There is normal pulmonary artery systolic pressure. The estimated right ventricular systolic pressure is 27.5 mmHg.  3. Left atrial size was moderately dilated.  4. Right atrial size was severely dilated.  5. The mitral valve is abnormal. Trivial mitral valve regurgitation. Moderate mitral annular calcification.  6. Tricuspid valve regurgitation is mild to moderate.  7. The aortic valve is grossly normal. Aortic valve regurgitation is not visualized. No aortic stenosis is present.  8. The inferior vena cava is normal in size with greater than 50% respiratory variability, suggesting right atrial pressure of 3 mmHg.  9. S/p 27 mm Watchman FLX 07/01/20, which is not well visualized or assessed on this transthoracic echo. FINDINGS  Left Ventricle: Left ventricular ejection fraction, by estimation, is 55 to 60%. The left ventricle has normal function. The left ventricle has no regional wall motion abnormalities. The  left ventricular internal cavity size was normal in size. There is  mild asymmetric left ventricular hypertrophy of the posterior-lateral segment. Left ventricular diastolic parameters are indeterminate. Elevated left ventricular end-diastolic pressure. Right Ventricle: The right ventricular size is mildly enlarged. Right vetricular wall thickness was not well visualized. Right ventricular systolic function is mildly reduced. There is normal pulmonary artery systolic pressure. The tricuspid regurgitant velocity is 2.79 m/s, and with an assumed right atrial pressure of 3 mmHg, the estimated right ventricular systolic pressure is 17.0 mmHg. Left Atrium: Left atrial size was moderately dilated. Right Atrium: Right atrial size was severely dilated. Pericardium: Trivial pericardial effusion is present. Mitral Valve: The mitral valve is abnormal. There is mild calcification of the mitral valve leaflet(s). Moderate mitral annular calcification. Trivial mitral valve regurgitation. Tricuspid Valve: The tricuspid valve is grossly normal. Tricuspid valve regurgitation is mild to moderate. Aortic Valve: The aortic valve is grossly normal. Aortic valve regurgitation is not visualized. No aortic stenosis is present. Pulmonic Valve: The pulmonic valve was not well visualized. Pulmonic valve regurgitation is trivial. Aorta: The aortic root and ascending aorta are structurally normal, with no evidence of dilitation. Venous: The inferior vena cava is normal in size with greater than 50% respiratory variability, suggesting right atrial pressure of 3 mmHg. IAS/Shunts: The interatrial septum was not well visualized.  LEFT VENTRICLE PLAX 2D LVIDd:         3.63 cm LVIDs:         2.90 cm LV PW:         1.14 cm LV IVS:        0.91 cm LVOT diam:     2.00 cm LV SV:         65 LV SV Index:   36 LVOT Area:     3.14 cm  RIGHT VENTRICLE RV S prime:     10.60 cm/s TAPSE (M-mode): 1.9 cm LEFT ATRIUM             Index  RIGHT ATRIUM            Index LA diam:        3.60 cm 1.97 cm/m  RA Area:     23.80 cm LA Vol (A2C):   65.4 ml 35.81 ml/m RA Volume:   88.20 ml  48.29 ml/m LA Vol (A4C):   64.3 ml 35.20 ml/m LA Biplane Vol: 74.3 ml 40.68 ml/m  AORTIC VALVE LVOT Vmax:   95.30 cm/s LVOT Vmean:  70.500 cm/s LVOT VTI:    0.207 m  AORTA Ao Root diam: 2.90 cm Ao Asc diam:  3.00 cm TRICUSPID VALVE TR Peak grad:   31.1 mmHg TR Vmax:        279.00 cm/s  SHUNTS Systemic VTI:  0.21 m Systemic Diam: 2.00 cm Cherlynn Kaiser MD Electronically signed by Cherlynn Kaiser MD Signature Date/Time: 07/13/2020/1:29:03 PM    Final     Scheduled Meds: . sodium chloride   Intravenous Once  . ALPRAZolam  0.25 mg Oral QHS  . docusate sodium  100 mg Oral BID  . insulin aspart  0-15 Units Subcutaneous TID WC  . insulin glargine  30 Units Subcutaneous QHS  . metoprolol tartrate  5 mg Intravenous Q6H  . metoprolol tartrate  25 mg Oral BID  . [START ON 07/16/2020] pantoprazole  40 mg Intravenous Q12H  . pravastatin  20 mg Oral q1800  . venlafaxine  37.5 mg Oral BID   Continuous Infusions: . sodium chloride Stopped (07/13/20 0845)  . magnesium sulfate bolus IVPB    . pantoprozole (PROTONIX) infusion 8 mg/hr (07/14/20 0742)     LOS: 2 days   Marylu Lund, MD [facility] Pager On Amion  If 7PM-7AM, please contact night-coverage 07/14/2020, 2:37 PM

## 2020-07-14 NOTE — Plan of Care (Signed)

## 2020-07-14 NOTE — Anesthesia Preprocedure Evaluation (Signed)
Anesthesia Evaluation  Patient identified by MRN, date of birth, ID band Patient awake    Reviewed: Allergy & Precautions, H&P , NPO status , Patient's Chart, lab work & pertinent test results  History of Anesthesia Complications (+) PONV  Airway Mallampati: II  TM Distance: >3 FB Neck ROM: Full    Dental no notable dental hx. (+) Teeth Intact, Dental Advisory Given   Pulmonary neg pulmonary ROS,    + rhonchi  + wheezing      Cardiovascular Exercise Tolerance: Good hypertension, Pt. on medications and Pt. on home beta blockers + Peripheral Vascular Disease  + dysrhythmias Atrial Fibrillation  Rhythm:Irregular Rate:Normal  TTE 07/13/20 1. Left ventricular ejection fraction, by estimation, is 55 to 60%. The left ventricle has normal function. The left ventricle has no regional wall motion abnormalities. There is mild asymmetric left ventricular hypertrophy of the posterior-lateral segment. Left ventricular diastolic parameters are indeterminate. Elevated left ventricular end-diastolic pressure.  2. Right ventricular systolic function is mildly reduced. The right ventricular size is mildly enlarged. There is normal pulmonary artery systolic pressure. The estimated right ventricular systolic pressure is 25.6 mmHg.  3. Left atrial size was moderately dilated.  4. Right atrial size was severely dilated.  5. The mitral valve is abnormal. Trivial mitral valve regurgitation. Moderate mitral annular calcification.  6. Tricuspid valve regurgitation is mild to moderate.  7. The aortic valve is grossly normal. Aortic valve regurgitation is not visualized. No aortic stenosis is present.  8. The inferior vena cava is normal in size with greater than 50% respiratory variability, suggesting right atrial pressure of 3 mmHg.  9. S/p 27 mm Watchman FLX 07/01/20, which is not well visualized or assessed on this transthoracic echo.    Neuro/Psych Anxiety Depression negative neurological ROS     GI/Hepatic Neg liver ROS, hiatal hernia, GERD  Medicated,  Endo/Other  diabetes, Insulin Dependent  Renal/GU Renal InsufficiencyRenal disease  negative genitourinary   Musculoskeletal  (+) Arthritis , Osteoarthritis,    Abdominal   Peds  Hematology  (+) Blood dyscrasia (Hgb 10), anemia ,   Anesthesia Other Findings Admitted on 07/12/20 for SOB. S/P Watchman procedure on 07/01/20. On admission, anemic to Hgb 7.0 with heme positive stools.   Reproductive/Obstetrics negative OB ROS                            Anesthesia Physical  Anesthesia Plan  ASA: IV  Anesthesia Plan: MAC   Post-op Pain Management:    Induction: Intravenous  PONV Risk Score and Plan: 4 or greater and Propofol infusion and Treatment may vary due to age or medical condition  Airway Management Planned: Natural Airway  Additional Equipment:   Intra-op Plan:   Post-operative Plan:   Informed Consent: I have reviewed the patients History and Physical, chart, labs and discussed the procedure including the risks, benefits and alternatives for the proposed anesthesia with the patient or authorized representative who has indicated his/her understanding and acceptance.     Dental advisory given  Plan Discussed with: CRNA  Anesthesia Plan Comments:        Anesthesia Quick Evaluation

## 2020-07-15 DIAGNOSIS — D62 Acute posthemorrhagic anemia: Secondary | ICD-10-CM | POA: Diagnosis not present

## 2020-07-15 DIAGNOSIS — E1142 Type 2 diabetes mellitus with diabetic polyneuropathy: Secondary | ICD-10-CM | POA: Diagnosis not present

## 2020-07-15 LAB — CBC
HCT: 33.3 % — ABNORMAL LOW (ref 36.0–46.0)
Hemoglobin: 10.1 g/dL — ABNORMAL LOW (ref 12.0–15.0)
MCH: 29.1 pg (ref 26.0–34.0)
MCHC: 30.3 g/dL (ref 30.0–36.0)
MCV: 96 fL (ref 80.0–100.0)
Platelets: 290 10*3/uL (ref 150–400)
RBC: 3.47 MIL/uL — ABNORMAL LOW (ref 3.87–5.11)
RDW: 14.9 % (ref 11.5–15.5)
WBC: 12.9 10*3/uL — ABNORMAL HIGH (ref 4.0–10.5)
nRBC: 0 % (ref 0.0–0.2)

## 2020-07-15 LAB — BASIC METABOLIC PANEL
Anion gap: 13 (ref 5–15)
BUN: 17 mg/dL (ref 8–23)
CO2: 29 mmol/L (ref 22–32)
Calcium: 9.1 mg/dL (ref 8.9–10.3)
Chloride: 95 mmol/L — ABNORMAL LOW (ref 98–111)
Creatinine, Ser: 0.7 mg/dL (ref 0.44–1.00)
GFR, Estimated: >60 mL/min (ref >60)
Glucose, Bld: 184 mg/dL — ABNORMAL HIGH (ref 70–99)
Potassium: 4.5 mmol/L (ref 3.5–5.1)
Sodium: 137 mmol/L (ref 135–145)

## 2020-07-15 LAB — GLUCOSE, CAPILLARY
Glucose-Capillary: 171 mg/dL — ABNORMAL HIGH (ref 70–99)
Glucose-Capillary: 215 mg/dL — ABNORMAL HIGH (ref 70–99)

## 2020-07-15 MED ORDER — METOPROLOL TARTRATE 50 MG PO TABS: 100.0000 mg | ORAL_TABLET | Freq: Two times a day (BID) | ORAL | Status: DC

## 2020-07-15 MED ORDER — AMLODIPINE BESYLATE 5 MG PO TABS
5.0000 mg | ORAL_TABLET | Freq: Every day | ORAL | Status: DC
  Administered 2020-07-15: 5 mg via ORAL
  Filled 2020-07-15: qty 1

## 2020-07-15 MED ORDER — AMLODIPINE BESYLATE 5 MG PO TABS: 5.0000 mg | ORAL_TABLET | Freq: Every day | ORAL | 0 refills | Status: DC

## 2020-07-15 MED ORDER — METOPROLOL TARTRATE 50 MG PO TABS
75.0000 mg | ORAL_TABLET | Freq: Once | ORAL | Status: AC
  Administered 2020-07-15: 75 mg via ORAL
  Filled 2020-07-15: qty 1

## 2020-07-15 NOTE — TOC Transition Note (Signed)
Transition of Care [facility]) - CM/SW Discharge Note   Patient Details  Name: KALEYAH LABRECK MRN: [id number] Date of Birth: 1941-08-18  [facility] CM/SW Contact:  Dessa Phi, RN Phone Number: 07/15/2020, 3:44 PM   Clinical Narrative: d/c home w/[facility]. Nsg to check if home 02 needed.Unless qualifies for home 02-[employer] rep Thedore Mins following if ordered to deliver to rm prior d/c. No further CM needs.            Patient Goals and CMS Choice        Discharge Placement                       Discharge Plan and Services                                     Social Determinants of Health (SDOH) Interventions     Readmission Risk Interventions No flowsheet data found.

## 2020-07-15 NOTE — Progress Notes (Signed)
Patient is doing fine after yesterday's EGD/argon plasma coagulation therapy.  No abdominal pain, in fact, she states her stomach feels "better."  She does have some nausea, which she attributes to not eating yet this morning.  She is hungry.  Hemoglobin is stable, actually improved to 10.1 today.  Endoscopic findings were reviewed with the patient's husband, who is at the bedside.  Recommendations:  1.  Okay for discharge from GI tract standpoint at any time  2.  Although the patient is on a "soft" diet at this time, she can have a regular diet after discharge.  3.  Okay to restart Eliquis tomorrow morning.  4.  If possible, I would recommend not restarting aspirin until Sunday morning (3 days from now) because of its gastric irritant effects, and the fact that the patient has cauterized lesions in the antrum of her stomach following yesterday's procedure.  5.  The patient was on generic Nexium 40 mg twice daily prior to admission.  I would resume that medication following discharge.  6.  Office follow-up with Dr. Watt Climes in 1-2 months (we will contact patient to arrange that).  However, I would recommend that the patient have follow-up with her PCP to check hemoglobin level and Hemoccult status a week or so following discharge, and perhaps a month or so thereafter.  We will sign off.  Please call us if you have any questions.  Cleotis Nipper, M.D. Pager [phone] If no answer or after 5 PM call [phone]

## 2020-07-15 NOTE — Progress Notes (Signed)
Progress Note  Patient Name: [name] Date of Encounter: 07/15/2020  [facility] Cardiologist: Candee Furbish, MD   Subjective   Still with rapid HR   Inpatient Medications    Scheduled Meds: . sodium chloride   Intravenous Once  . ALPRAZolam  0.25 mg Oral QHS  . docusate sodium  100 mg Oral BID  . insulin aspart  0-15 Units Subcutaneous TID WC  . insulin glargine  30 Units Subcutaneous QHS  . metoprolol tartrate  25 mg Oral BID  . [START ON 07/16/2020] pantoprazole  40 mg Intravenous Q12H  . pravastatin  20 mg Oral q1800  . venlafaxine  37.5 mg Oral BID   Continuous Infusions: . sodium chloride Stopped (07/14/20 1807)  . sodium chloride    . pantoprozole (PROTONIX) infusion 8 mg/hr (07/15/20 0807)   PRN Meds: acetaminophen, albuterol, labetalol, methocarbamol, ondansetron (ZOFRAN) IV **OR** ondansetron, sodium chloride   Vital Signs    Vitals:   07/15/20 0207 07/15/20 0528 07/15/20 0734 07/15/20 0844  BP: (!) 151/72 (!) 152/83 (!) 158/80   Pulse: 99 (!) 121  (!) 113  Resp: 20 18  20   Temp: 98.1 F (36.7 C) 99 F (37.2 C)  99.3 F (37.4 C)  TempSrc:  Oral  Oral  SpO2: 98% 100%  100%  Weight:      Height:        Intake/Output Summary (Last 24 hours) at 07/15/2020 1016 Last data filed at 07/15/2020 0807 Gross per 24 hour  Intake 1092.32 ml  Output 1200 ml  Net -107.68 ml   Last 3 Weights 07/14/2020 07/12/2020 07/01/2020  Weight (lbs) 157 lb 6.5 oz 157 lb 6.5 oz 155 lb  Weight (kg) 71.4 kg 71.4 kg 70.308 kg      Telemetry    Atrial fib at times HR to 140 other times 105 - Personally Reviewed  ECG    No new - Personally Reviewed  Physical Exam  Exam per Dr. Rodman Pickle    High Sensitivity Troponin:   Recent Labs  Lab 07/12/20 1626  TROPONINIHS 4      Chemistry Recent Labs  Lab 07/13/20 0418 07/14/20 0417 07/15/20 0500  NA 137 138 137  K 4.9 4.2 4.5  CL 100 96* 95*  CO2 26 32 29  GLUCOSE 253* 195* 184*  BUN 27* 21  17  CREATININE 1.02* 0.94 0.70  CALCIUM 8.8* 9.0 9.1  GFRNONAA 56* >60 >60  ANIONGAP 11 10 13      Hematology Recent Labs  Lab 07/12/20 1626 07/12/20 2153 07/14/20 0417 07/14/20 0417 07/14/20 0937 07/14/20 1735 07/15/20 0500  WBC 13.5*  --  14.4*  --   --   --  12.9*  RBC 2.83*  --  3.29*  --   --   --  3.47*  HGB 8.2*   < > 9.6*   < > 10.0* 8.7* 10.1*  HCT 27.6*   < > 31.0*   < > 32.7* 28.3* 33.3*  MCV 97.5  --  94.2  --   --   --  96.0  MCH 29.0  --  29.2  --   --   --  29.1  MCHC 29.7*  --  31.0  --   --   --  30.3  RDW 15.9*  --  15.7*  --   --   --  14.9  PLT 356  --  287  --   --   --  290   < > =  values in this interval not displayed.    BNPNo results for input(s): BNP, PROBNP in the last 168 hours.   DDimer No results for input(s): DDIMER in the last 168 hours.   Radiology    ECHOCARDIOGRAM COMPLETE  Result Date: 07/13/2020    ECHOCARDIOGRAM REPORT   Patient Name:   [name] Date of Exam: 07/13/2020 Medical Rec #:  [id number]        Height:       67.0 in Accession #:    [id number]       Weight:       157.4 lb Date of Birth:  1941-07-30         BSA:          1.827 m Patient Age:    79 years         BP:           123/70 mmHg Patient Gender: F                HR:           101 bpm. Exam Location:  Inpatient Procedure: 2D Echo, Color Doppler and Cardiac Doppler Indications:    K99.83 Acute systolic (congestive) heart failure  History:        Patient has prior history of Echocardiogram examinations, most                 recent 07/07/2020. Arrythmias:Atrial Fibrillation; Risk                 Factors:Hypertension, Diabetes and Dyslipidemia. 43mm Watchman                 FLX placed 07/01/20.  Sonographer:    Raquel Sarna Senior RDCS Referring Phys: [place]  1. Left ventricular ejection fraction, by estimation, is 55 to 60%. The left ventricle has normal function. The left ventricle has no regional wall motion abnormalities. There is mild asymmetric left  ventricular hypertrophy of the posterior-lateral segment. Left ventricular diastolic parameters are indeterminate. Elevated left ventricular end-diastolic pressure.  2. Right ventricular systolic function is mildly reduced. The right ventricular size is mildly enlarged. There is normal pulmonary artery systolic pressure. The estimated right ventricular systolic pressure is 38.2 mmHg.  3. Left atrial size was moderately dilated.  4. Right atrial size was severely dilated.  5. The mitral valve is abnormal. Trivial mitral valve regurgitation. Moderate mitral annular calcification.  6. Tricuspid valve regurgitation is mild to moderate.  7. The aortic valve is grossly normal. Aortic valve regurgitation is not visualized. No aortic stenosis is present.  8. The inferior vena cava is normal in size with greater than 50% respiratory variability, suggesting right atrial pressure of 3 mmHg.  9. S/p 27 mm Watchman FLX 07/01/20, which is not well visualized or assessed on this transthoracic echo. FINDINGS  Left Ventricle: Left ventricular ejection fraction, by estimation, is 55 to 60%. The left ventricle has normal function. The left ventricle has no regional wall motion abnormalities. The left ventricular internal cavity size was normal in size. There is  mild asymmetric left ventricular hypertrophy of the posterior-lateral segment. Left ventricular diastolic parameters are indeterminate. Elevated left ventricular end-diastolic pressure. Right Ventricle: The right ventricular size is mildly enlarged. Right vetricular wall thickness was not well visualized. Right ventricular systolic function is mildly reduced. There is normal pulmonary artery systolic pressure. The tricuspid regurgitant velocity is 2.79 m/s, and with an assumed right atrial pressure of 3 mmHg, the estimated  right ventricular systolic pressure is 84.6 mmHg. Left Atrium: Left atrial size was moderately dilated. Right Atrium: Right atrial size was severely dilated.  Pericardium: Trivial pericardial effusion is present. Mitral Valve: The mitral valve is abnormal. There is mild calcification of the mitral valve leaflet(s). Moderate mitral annular calcification. Trivial mitral valve regurgitation. Tricuspid Valve: The tricuspid valve is grossly normal. Tricuspid valve regurgitation is mild to moderate. Aortic Valve: The aortic valve is grossly normal. Aortic valve regurgitation is not visualized. No aortic stenosis is present. Pulmonic Valve: The pulmonic valve was not well visualized. Pulmonic valve regurgitation is trivial. Aorta: The aortic root and ascending aorta are structurally normal, with no evidence of dilitation. Venous: The inferior vena cava is normal in size with greater than 50% respiratory variability, suggesting right atrial pressure of 3 mmHg. IAS/Shunts: The interatrial septum was not well visualized.  LEFT VENTRICLE PLAX 2D LVIDd:         3.63 cm LVIDs:         2.90 cm LV PW:         1.14 cm LV IVS:        0.91 cm LVOT diam:     2.00 cm LV SV:         65 LV SV Index:   36 LVOT Area:     3.14 cm  RIGHT VENTRICLE RV S prime:     10.60 cm/s TAPSE (M-mode): 1.9 cm LEFT ATRIUM             Index       RIGHT ATRIUM           Index LA diam:        3.60 cm 1.97 cm/m  RA Area:     23.80 cm LA Vol (A2C):   65.4 ml 35.81 ml/m RA Volume:   88.20 ml  48.29 ml/m LA Vol (A4C):   64.3 ml 35.20 ml/m LA Biplane Vol: 74.3 ml 40.68 ml/m  AORTIC VALVE LVOT Vmax:   95.30 cm/s LVOT Vmean:  70.500 cm/s LVOT VTI:    0.207 m  AORTA Ao Root diam: 2.90 cm Ao Asc diam:  3.00 cm TRICUSPID VALVE TR Peak grad:   31.1 mmHg TR Vmax:        279.00 cm/s  SHUNTS Systemic VTI:  0.21 m Systemic Diam: 2.00 cm Cherlynn Kaiser MD Electronically signed by Cherlynn Kaiser MD Signature Date/Time: 07/13/2020/1:29:03 PM    Final     Cardiac Studies   Echo 07/13/20 IMPRESSIONS    1. Left ventricular ejection fraction, by estimation, is 55 to 60%. The  left ventricle has normal function. The  left ventricle has no regional  wall motion abnormalities. There is mild asymmetric left ventricular  hypertrophy of the posterior-lateral  segment. Left ventricular diastolic parameters are indeterminate. Elevated  left ventricular end-diastolic pressure.  2. Right ventricular systolic function is mildly reduced. The right  ventricular size is mildly enlarged. There is normal pulmonary artery  systolic pressure. The estimated right ventricular systolic pressure is  96.2 mmHg.  3. Left atrial size was moderately dilated.  4. Right atrial size was severely dilated.  5. The mitral valve is abnormal. Trivial mitral valve regurgitation.  Moderate mitral annular calcification.  6. Tricuspid valve regurgitation is mild to moderate.  7. The aortic valve is grossly normal. Aortic valve regurgitation is not  visualized. No aortic stenosis is present.  8. The inferior vena cava is normal in size with greater than 50%  respiratory variability, suggesting  right atrial pressure of 3 mmHg.  9. S/p 27 mm Watchman FLX 07/01/20, which is not well visualized or  assessed on this transthoracic echo.   FINDINGS  Left Ventricle: Left ventricular ejection fraction, by estimation, is 55  to 60%. The left ventricle has normal function. The left ventricle has no  regional wall motion abnormalities. The left ventricular internal cavity  size was normal in size. There is  mild asymmetric left ventricular hypertrophy of the posterior-lateral  segment. Left ventricular diastolic parameters are indeterminate. Elevated  left ventricular end-diastolic pressure.   Trivial pericardial effusion.   Patient Profile     79 y.o. female with a hx of PAF, DM, HTN, recurrent GIB 2016, GERD, OA, asthma,now with GIB and hx of watchman 07/01/2020  Assessment & Plan    1. Acute diastolic CHF - pt w/ JVD and SOB, but NAD on CXR - echo with trivial pericardial effusion yesterday - 11/10 echo w/ ASD from septal  puncture, trivial effusion, EF 60-65% -Mg+ 1.7  --I&0 +162 since admit  Lasix 20 every 12 hours and rec'd 40 yesterday as well.    2.PersistentAfib -HR is high- to normal - pt symptomatic regarding palpitations, unclear if the Afib is contributing to fatigue, but this is more likely from anemia - Lopressor increased from 12.5 >> 25 mg bid.  - she had gotten prednisone 60 mg x 1 last pm for her wheezing - TSH has been ok in the past, with recurrent Afib and fatigue we did not recheck - cannot anticoagulate at this time,  but may resume eliquis on Friday and ASA on Sunday morning per GI    3. GIB, anemia (hgb today 10.1) - has had antral ulcers in the past 2nd gastric antral vascular ectasia, s/p RFA,argon plasma coagulation therapy in past -  EGD yesterday .Desquamating distal esophagitis (most likely, this is reflective of recent erosive esophagitis, now in the resolving phase), and gastric antral vascular ectasia, as previously noted in this patient Argon plasma coagulation therapy was performed  -  2 U PRBCs yesterday, Lasix  40 mg x 1 after 2nd unit - per IM/GI         For questions or updates, please contact [facility] Please consult [email or web address] for contact info under        Signed, Cecilie Kicks, NP  07/15/2020, 10:16 AM

## 2020-07-15 NOTE — Progress Notes (Signed)
Home with oxygen

## 2020-07-15 NOTE — Discharge Instructions (Signed)
Restart your Eliquis on Friday (11/19) Restart your aspirin on Sunday (11/21)

## 2020-07-15 NOTE — Discharge Summary (Signed)
Physician Discharge Summary  [name] YTK:[id number] DOB: November 14, 1940 DOA: 07/12/2020  PCP: Leighton Ruff, MD  Admit date: 07/12/2020 Discharge date: 07/15/2020  Admitted From: Home Disposition:  Home  Recommendations for Outpatient Follow-up:  1. Follow up with PCP in 1-2 weeks 2. Follow up with Cardiology as scheduled 3. Follow up with GI as scheduled  Discharge Condition:Stable CODE STATUS:Full Diet recommendation: Diabetic, heart healthy   Brief/Interim Summary: 79 y.o.femalewith medical history significant ofPAFon Eliquis, h/o GI bleeds, remote h/o asthma - with no flares or need for medication for several years, DM2, GERD, HTN.Due tohigh risk forrecurrentGI bleeding she underwent Watchman procedure in order to come off chronic anti-coagulation, done 07/01/20. Over the past several days she has had increased SOB/DOE, chest tightness and wheezing. Due to her symptoms she presents to [facility] for evaluation. She denies Abdominal pain, chest pain, denies any frank bleeding,orproductivecough  Shortly after presentation, pt was noted to have acute drop in hgb with concern for upper GI bleed. Pt received 2 units of PRBC's and GI consulted. Cardiology was consulted as well given rapid afib  Discharge Diagnoses:  Active Problems:   Asthma, chronic   HTN (hypertension)   DM type 2 with diabetic peripheral neuropathy (HCC)   Acute GI bleeding   Acute blood loss anemia   Persistent atrial fibrillation (HCC)   SOB (shortness of breath)   ABLA (acute blood loss anemia)  Acute blood loss anemia with presumed upper GI bleed Baseline hemoglobin noted to be around 12 Presented with hemoglobin of 8.2 with pos occult stool Her hemoglobin has been downtrending, 7.0 on 07/13/2020. 2 unitsPRBC was transfused GI was consulted and pt underwent EGD on 11/17 with findings of resolving moderately severe desquamating reflux esophagitis with gastric antral vascular ectasia s/p  APC -Per GI, OK to resume eliquis on 11/19 and ASA on 11/21 -OK to d/c from GI perspective  Presumed upper GI bleed Previous endoscopy, EGD was done on 11/26/2019 by Dr.Magodwhich showed:Gastric antral vascular ectasia without bleeding. Treated with radiofrequency ablation. Continue Protonix drip GI had followed in hospital. EGD as per above  Dyspnea, suspect secondary tosymptomaticanemia Presented with worsening dyspnea on exertion. Denies chest pain. Dr. [place] Crane reviewed her chest x-ray done on 07/12/2020, no active disease, no infiltrates, no increase in pulmonary vascularity, no evidence of pulmonary edema or pleural effusions. Pt meets home O2 requirements  Paroxysmal A. Fib with RVR Patient was continued on tele Pt was on a lower dose of metoprolol initially secondary to concerns of lowering BP as a result of acute blood loss Eliquis was on hold Following above EGD, recommendations to resume Eliquis on 11/19 and ASA on 11/21  Asthma, no evidence of an asthma exacerbation at the time of this visit Bronchodilators as needed Maintain O2 saturation greater than 92%. Will need home O2 on d/c  Essential hypertension Now on home metoprolol dose BP improved  Diastolic FTD/32-KGU postop watchman procedure Euvolemic on exam Received 2 doses of Lasix IV 20 mg 2D echo has been completed Cardiology following, appreciate input Meets home O2 per above   Discharge Instructions   Allergies as of 07/15/2020      Reactions   Vasotec Shortness Of Breath, Other (See Comments)   wheezing   Atorvastatin Other (See Comments)   Leg cramps   Codeine Nausea And Vomiting   Cymbalta [duloxetine Hcl] Itching   Lansoprazole Itching, Swelling, Rash, Other (See Comments)   Redness, swelling of mouth    Morphine And Related Itching   Sulfate  Itching   Tramadol Itching   Adhesive [tape] Rash   Scopolamine Rash      Medication List    STOP taking these medications   losartan 100  MG tablet Commonly known as: COZAAR   triamterene-hydrochlorothiazide 37.5-25 MG tablet Commonly known as: MAXZIDE-25     TAKE these medications   acetaminophen 500 MG tablet Commonly known as: TYLENOL Take 1,000 mg by mouth every 8 (eight) hours as needed for moderate pain.   albuterol 108 (90 Base) MCG/ACT inhaler Commonly known as: ProAir HFA Inhale 2 puffs into the lungs every 4 (four) hours as needed for wheezing or shortness of breath.   ALPRAZolam 0.25 MG tablet Commonly known as: XANAX Take 1 tablet by mouth at bedtime.   amLODipine 5 MG tablet Commonly known as: NORVASC Take 1 tablet (5 mg total) by mouth daily. Start taking on: July 16, 2020 What changed:   medication strength  how much to take   aspirin EC 81 MG tablet Take 81 mg by mouth daily. Swallow whole.   diphenhydrAMINE 25 MG tablet Commonly known as: BENADRYL Take 25 mg by mouth daily as needed for allergies.   docusate sodium 100 MG capsule Commonly known as: COLACE Take 100 mg by mouth 2 (two) times daily as needed for mild constipation.   Droplet Pen Needles 32G X 4 MM Misc Generic drug: Insulin Pen Needle   Eliquis 5 MG Tabs tablet Generic drug: apixaban TAKE 1 TABLET TWICE DAILY What changed: how much to take   esomeprazole 40 MG capsule Commonly known as: NEXIUM Take 40 mg by mouth in the morning and at bedtime. Morning & afternoon.   fluticasone 50 MCG/ACT nasal spray Commonly known as: FLONASE Place 1 spray into both nostrils daily as needed for allergies.   Fusion Plus Caps Take 1 capsule by mouth every evening.   gabapentin 300 MG capsule Commonly known as: NEURONTIN Take 600 mg by mouth in the morning, at noon, and at bedtime.   Lantus SoloStar 100 UNIT/ML Solostar Pen Generic drug: insulin glargine Inject 30 Units into the skin at bedtime.   Livalo 1 MG Tabs Generic drug: Pitavastatin Calcium Take 1 mg by mouth at bedtime.   metoprolol tartrate 50 MG  tablet Commonly known as: LOPRESSOR Take 75-100 mg by mouth See admin instructions. Take 2 tablets (100 mg) by mouth in the morning & take 1.5 tablets (75 mg) by mouth in the evening.   NovoLOG FlexPen 100 UNIT/ML FlexPen Generic drug: insulin aspart Inject 0-16 Units into the skin in the morning, at noon, and at bedtime. Sliding scale insulin   ondansetron 4 MG tablet Commonly known as: ZOFRAN Take 4 mg by mouth every 8 (eight) hours as needed for nausea or vomiting.   PRESERVISION AREDS 2 PO Take 1 capsule by mouth 2 (two) times daily.   venlafaxine 37.5 MG tablet Commonly known as: EFFEXOR Take 37.5 mg by mouth 2 (two) times daily.       Follow-up Information    Leighton Ruff, MD. Schedule an appointment as soon as possible for a visit in 2 week(s).   Specialty: Family Medicine Contact information: [place] [address] [phone]        Jerline Pain, MD .   Specialty: Cardiology Contact information: [phone] [address] [place] [address] [phone]        Vickie Epley, MD .   Specialties: Cardiology, Radiology Contact information: [place]  [address] [place] [address] [phone]              Allergies  Allergen Reactions  . Vasotec Shortness Of Breath and Other (See Comments)    wheezing  . Atorvastatin Other (See Comments)    Leg cramps   . Codeine Nausea And Vomiting  . Cymbalta [Duloxetine Hcl] Itching  . Lansoprazole Itching, Swelling, Rash and Other (See Comments)    Redness, swelling of mouth   . Morphine And Related Itching  . Sulfate Itching  . Tramadol Itching  . Adhesive [Tape] Rash  . Scopolamine Rash    Consultations:  Cardiology  Procedures/Studies: DG Chest 2 View  Result Date: 07/12/2020 CLINICAL DATA:  Shortness of breath EXAM: CHEST - 2 VIEW COMPARISON:  01/27/2020 FINDINGS: The heart size and mediastinal contours are within normal limits. No focal airspace  consolidation, pleural effusion, or pneumothorax. Interval cement augmentation of the T8 vertebral body. Unchanged alignment of thoracic spinal stimulator leads. IMPRESSION: No active cardiopulmonary disease. Electronically Signed   By: Davina Poke D.O.   On: 07/12/2020 14:32   US BREAST LTD UNI RIGHT INC AXILLA  Result Date: 07/09/2020 CLINICAL DATA:  Recall from screening mammography with tomosynthesis, possible mass involving the INNER RIGHT breast at ANTERIOR to MIDDLE depth. EXAM: DIGITAL DIAGNOSTIC RIGHT MAMMOGRAM WITH TOMO ULTRASOUND RIGHT BREAST COMPARISON:  Previous exam(s). ACR Breast Density Category a: The breast tissue is almost entirely fatty. FINDINGS: Tomosynthesis and synthesized digital 2D spot-compression CC and MLO views of the area of concern in the RIGHT breast were obtained. Spot compression images confirm a superficial circumscribed mixed density mass in the INNER breast, containing isodense elements and fat density elements, measuring approximately 7 mm. There is no associated architectural distortion or suspicious calcifications. On correlative physical exam, there is no palpable abnormality in the INNER RIGHT breast. Targeted RIGHT breast ultrasound is performed, showing a superficial nearly round anechoic mass with a thin internal septation at the 2:30 o'clock position approximately 8 cm from the nipple measuring approximately 6 x 5 x 6 mm, demonstrating mixed posterior characteristics and demonstrating no internal power Doppler flow, corresponding to the screening mammographic finding. No suspicious solid mass is identified. IMPRESSION: Benign fat necrosis/oil cyst in the INNER RIGHT breast at the 2:30 o'clock position approximately 8 cm from nipple which accounts for the screening mammographic finding. RECOMMENDATION: Screening mammogram in one year.(Code:SM-B-01Y) I have discussed the findings and recommendations with the patient. If applicable, a reminder letter will be sent  to the patient regarding the next appointment. BI-RADS CATEGORY  2: Benign. Electronically Signed   By: Evangeline Dakin M.D.   On: 07/09/2020 15:13   MM DIAG BREAST TOMO UNI RIGHT  Result Date: 07/09/2020 CLINICAL DATA:  Recall from screening mammography with tomosynthesis, possible mass involving the INNER RIGHT breast at ANTERIOR to MIDDLE depth. EXAM: DIGITAL DIAGNOSTIC RIGHT MAMMOGRAM WITH TOMO ULTRASOUND RIGHT BREAST COMPARISON:  Previous exam(s). ACR Breast Density Category a: The breast tissue is almost entirely fatty. FINDINGS: Tomosynthesis and synthesized digital 2D spot-compression CC and MLO views of the area of concern in the RIGHT breast were obtained. Spot compression images confirm a superficial circumscribed mixed density mass in the INNER breast, containing isodense elements and fat density elements, measuring approximately 7 mm. There is no associated architectural distortion or suspicious calcifications. On correlative physical exam, there is no palpable abnormality in the INNER RIGHT breast. Targeted RIGHT breast ultrasound is performed, showing a superficial nearly round anechoic mass with a thin internal septation  at the 2:30 o'clock position approximately 8 cm from the nipple measuring approximately 6 x 5 x 6 mm, demonstrating mixed posterior characteristics and demonstrating no internal power Doppler flow, corresponding to the screening mammographic finding. No suspicious solid mass is identified. IMPRESSION: Benign fat necrosis/oil cyst in the INNER RIGHT breast at the 2:30 o'clock position approximately 8 cm from nipple which accounts for the screening mammographic finding. RECOMMENDATION: Screening mammogram in one year.(Code:SM-B-01Y) I have discussed the findings and recommendations with the patient. If applicable, a reminder letter will be sent to the patient regarding the next appointment. BI-RADS CATEGORY  2: Benign. Electronically Signed   By: Evangeline Dakin M.D.   On:  07/09/2020 15:13   MM 3D SCREEN BREAST BILATERAL  Result Date: 06/22/2020 CLINICAL DATA:  Screening. EXAM: DIGITAL SCREENING BILATERAL MAMMOGRAM WITH TOMO AND CAD COMPARISON:  Previous exam(s). ACR Breast Density Category a: The breast tissue is almost entirely fatty. FINDINGS: In the right breast, a possible mass warrants further evaluation. In the left breast, no findings suspicious for malignancy. Images were processed with CAD. IMPRESSION: Further evaluation is suggested for a possible mass in the right breast. RECOMMENDATION: Diagnostic mammogram and possibly ultrasound of the right breast. (Code:[id number]) The patient will be contacted regarding the findings, and additional imaging will be scheduled. BI-RADS CATEGORY  0: Incomplete. Need additional imaging evaluation and/or prior mammograms for comparison. Electronically Signed   By: Kristopher Oppenheim M.D.   On: 06/22/2020 10:18   ECHOCARDIOGRAM COMPLETE  Result Date: 07/13/2020    ECHOCARDIOGRAM REPORT   Patient Name:   [name] Date of Exam: 07/13/2020 Medical Rec #:  [id number]        Height:       67.0 in Accession #:    [id number]       Weight:       157.4 lb Date of Birth:  March 07, 1941         BSA:          1.827 m Patient Age:    89 years         BP:           123/70 mmHg Patient Gender: F                HR:           101 bpm. Exam Location:  Inpatient Procedure: 2D Echo, Color Doppler and Cardiac Doppler Indications:    X54.00 Acute systolic (congestive) heart failure  History:        Patient has prior history of Echocardiogram examinations, most                 recent 07/07/2020. Arrythmias:Atrial Fibrillation; Risk                 Factors:Hypertension, Diabetes and Dyslipidemia. 25mm Watchman                 FLX placed 07/01/20.  Sonographer:    Raquel Sarna Senior RDCS Referring Phys: [place]  1. Left ventricular ejection fraction, by estimation, is 55 to 60%. The left ventricle has normal function. The left ventricle  has no regional wall motion abnormalities. There is mild asymmetric left ventricular hypertrophy of the posterior-lateral segment. Left ventricular diastolic parameters are indeterminate. Elevated left ventricular end-diastolic pressure.  2. Right ventricular systolic function is mildly reduced. The right ventricular size is mildly enlarged. There is normal pulmonary artery systolic pressure. The estimated right ventricular systolic pressure is 34.1  mmHg.  3. Left atrial size was moderately dilated.  4. Right atrial size was severely dilated.  5. The mitral valve is abnormal. Trivial mitral valve regurgitation. Moderate mitral annular calcification.  6. Tricuspid valve regurgitation is mild to moderate.  7. The aortic valve is grossly normal. Aortic valve regurgitation is not visualized. No aortic stenosis is present.  8. The inferior vena cava is normal in size with greater than 50% respiratory variability, suggesting right atrial pressure of 3 mmHg.  9. S/p 27 mm Watchman FLX 07/01/20, which is not well visualized or assessed on this transthoracic echo. FINDINGS  Left Ventricle: Left ventricular ejection fraction, by estimation, is 55 to 60%. The left ventricle has normal function. The left ventricle has no regional wall motion abnormalities. The left ventricular internal cavity size was normal in size. There is  mild asymmetric left ventricular hypertrophy of the posterior-lateral segment. Left ventricular diastolic parameters are indeterminate. Elevated left ventricular end-diastolic pressure. Right Ventricle: The right ventricular size is mildly enlarged. Right vetricular wall thickness was not well visualized. Right ventricular systolic function is mildly reduced. There is normal pulmonary artery systolic pressure. The tricuspid regurgitant velocity is 2.79 m/s, and with an assumed right atrial pressure of 3 mmHg, the estimated right ventricular systolic pressure is 62.2 mmHg. Left Atrium: Left atrial size was  moderately dilated. Right Atrium: Right atrial size was severely dilated. Pericardium: Trivial pericardial effusion is present. Mitral Valve: The mitral valve is abnormal. There is mild calcification of the mitral valve leaflet(s). Moderate mitral annular calcification. Trivial mitral valve regurgitation. Tricuspid Valve: The tricuspid valve is grossly normal. Tricuspid valve regurgitation is mild to moderate. Aortic Valve: The aortic valve is grossly normal. Aortic valve regurgitation is not visualized. No aortic stenosis is present. Pulmonic Valve: The pulmonic valve was not well visualized. Pulmonic valve regurgitation is trivial. Aorta: The aortic root and ascending aorta are structurally normal, with no evidence of dilitation. Venous: The inferior vena cava is normal in size with greater than 50% respiratory variability, suggesting right atrial pressure of 3 mmHg. IAS/Shunts: The interatrial septum was not well visualized.  LEFT VENTRICLE PLAX 2D LVIDd:         3.63 cm LVIDs:         2.90 cm LV PW:         1.14 cm LV IVS:        0.91 cm LVOT diam:     2.00 cm LV SV:         65 LV SV Index:   36 LVOT Area:     3.14 cm  RIGHT VENTRICLE RV S prime:     10.60 cm/s TAPSE (M-mode): 1.9 cm LEFT ATRIUM             Index       RIGHT ATRIUM           Index LA diam:        3.60 cm 1.97 cm/m  RA Area:     23.80 cm LA Vol (A2C):   65.4 ml 35.81 ml/m RA Volume:   88.20 ml  48.29 ml/m LA Vol (A4C):   64.3 ml 35.20 ml/m LA Biplane Vol: 74.3 ml 40.68 ml/m  AORTIC VALVE LVOT Vmax:   95.30 cm/s LVOT Vmean:  70.500 cm/s LVOT VTI:    0.207 m  AORTA Ao Root diam: 2.90 cm Ao Asc diam:  3.00 cm TRICUSPID VALVE TR Peak grad:   31.1 mmHg TR Vmax:  279.00 cm/s  SHUNTS Systemic VTI:  0.21 m Systemic Diam: 2.00 cm Cherlynn Kaiser MD Electronically signed by Cherlynn Kaiser MD Signature Date/Time: 07/13/2020/1:29:03 PM    Final    ECHO TEE  Result Date: 07/01/2020    TRANSESOPHOGEAL ECHO REPORT   Patient Name:   [name] Date of Exam: 07/01/2020 Medical Rec #:  [id number]        Height:       64.0 in Accession #:    [id number]       Weight:       155.0 lb Date of Birth:  June 20, 1941         BSA:          1.756 m Patient Age:    69 years         BP:           144/72 mmHg Patient Gender: F                HR:           64 bpm. Exam Location:  Inpatient Procedure: Transesophageal Echo, Color Doppler, Cardiac Doppler and 3D Echo Indications:     Watchman Procedure,; I48.91* Unspecified atrial fibrillation  History:         Patient has prior history of Echocardiogram examinations, most                  recent 04/28/2020. Arrythmias:Atrial Fibrillation; Risk                  Factors:Hypertension, Diabetes and Dyslipidemia.  Sonographer:     Raquel Sarna Senior RDCS Referring Phys:  [id number] Vickie Epley Diagnosing Phys: Eleonore Chiquito MD  Sonographer Comments: 19mm Watchman LAA Occluder. PROCEDURE: After discussion of the risks and benefits of a TEE, an informed consent was obtained from the patient. The transesophogeal probe was passed without difficulty through the esophogus of the patient. Sedation performed by different physician. The patient was monitored while under deep sedation. Anesthestetic sedation was provided intravenously by Anesthesiology: 150mg  of Propofol. Image quality was excellent. The patient's vital signs; including heart rate, blood pressure, and oxygen saturation; remained stable throughout the procedure. The patient developed no complications during the procedure. IMPRESSIONS  1. Intraprocedure guidance for left atrial appendage closure. IAS puncture was visualized with echo and small L to R shunt remained at the end of the procedure. A 27 mm Watchman FLX device was deployed with maximum diameter 21.9 mm with 19% compression.  No device leak detected by color doppler. A small pericardial effusion was detected before the case anterior to the RV ~0.4-0.5 cm. The effusion remained at the end of the case with enlargement.  The procedure was tolerated well with no immediate complications.  2. Left ventricular ejection fraction, by estimation, is 60 to 65%. The left ventricle has normal function.  3. Right ventricular systolic function is normal. The right ventricular size is mildly enlarged.  4. Left atrial size was mildly dilated. No left atrial/left atrial appendage thrombus was detected.  5. A small pericardial effusion is present. The pericardial effusion is anterior to the right ventricle. There is no evidence of cardiac tamponade.  6. The mitral valve is grossly normal. Trivial mitral valve regurgitation. No evidence of mitral stenosis.  7. The aortic valve is tricuspid. There is mild calcification of the aortic valve. Aortic valve regurgitation is not visualized. Mild aortic valve sclerosis is present, with no evidence of aortic valve stenosis.  8. There is  mild (Grade II) layered plaque involving the descending aorta and transverse aorta. FINDINGS  Left Ventricle: Left ventricular ejection fraction, by estimation, is 60 to 65%. The left ventricle has normal function. The left ventricular internal cavity size was normal in size. Right Ventricle: The right ventricular size is mildly enlarged. No increase in right ventricular wall thickness. Right ventricular systolic function is normal. Left Atrium: Left atrial size was mildly dilated. No left atrial/left atrial appendage thrombus was detected. Right Atrium: Right atrial size was normal in size. Pericardium: A small pericardial effusion is present. The pericardial effusion is anterior to the right ventricle. There is no evidence of cardiac tamponade. Mitral Valve: The mitral valve is grossly normal. Trivial mitral valve regurgitation. No evidence of mitral valve stenosis. Tricuspid Valve: The tricuspid valve is grossly normal. Tricuspid valve regurgitation is trivial. No evidence of tricuspid stenosis. Aortic Valve: The aortic valve is tricuspid. There is mild calcification of  the aortic valve. Aortic valve regurgitation is not visualized. Mild aortic valve sclerosis is present, with no evidence of aortic valve stenosis. Pulmonic Valve: The pulmonic valve was grossly normal. Pulmonic valve regurgitation is not visualized. No evidence of pulmonic stenosis. Aorta: The aortic root is normal in size and structure. There is mild (Grade II) layered plaque involving the descending aorta and transverse aorta. Venous: The left upper pulmonary vein, left lower pulmonary vein, right lower pulmonary vein and right upper pulmonary vein are normal. IAS/Shunts: The atrial septum is grossly normal.  TRICUSPID VALVE TR Peak grad:   11.2 mmHg TR Vmax:        167.00 cm/s Eleonore Chiquito MD Electronically signed by Eleonore Chiquito MD Signature Date/Time: 07/01/2020/2:03:15 PM    Final (Updated)    ECHOCARDIOGRAM LIMITED  Result Date: 07/07/2020    ECHOCARDIOGRAM LIMITED REPORT   Patient Name:   [name] Date of Exam: 07/07/2020 Medical Rec #:  [id number]        Height:       64.0 in Accession #:    [id number]       Weight:       155.0 lb Date of Birth:  June 07, 1941         BSA:          1.756 m Patient Age:    70 years         BP:           130/60 mmHg Patient Gender: F                HR:           81 bpm. Exam Location:  [address] Procedure: Limited Echo, Cardiac Doppler and Limited Color Doppler Indications:    I31.3 Pericardial Effusion  History:        Patient has prior history of Echocardiogram examinations, most                 recent 07/02/2020. Arrythmias:Atrial Fibrillation and PAC,                 Signs/Symptoms:Murmur, Dizziness/Lightheadedness and Shortness                 of Breath; Risk Factors:Hypertension, Diabetes and Family                 History of Coronary Artery Disease. #27 Watchman Device                 Implanted.  Sonographer:    [place] Referring  Phys: [id number] Satira Mccallum TILLERY IMPRESSIONS  1. Left ventricular ejection fraction, by estimation, is 60 to  65%. The left ventricle has normal function. The left ventricle has no regional wall motion abnormalities. Left ventricular diastolic parameters are indeterminate.  2. Right ventricular systolic function is normal. The right ventricular size is normal. There is normal pulmonary artery systolic pressure.  3. Post Watchman with 27 mm FLX not visualized on TTE . Left atrial size was mildly dilated.  4. Residual "ASD: flow from transeptal puncture noted only left to right by color . Evidence of atrial level shunting detected by color flow Doppler.  5. Previous trivial pericardial effusion noted over the RV free wall and transverse sinus not noted on this echo.  6. The mitral valve is abnormal. Trivial mitral valve regurgitation. No evidence of mitral stenosis. Moderate mitral annular calcification.  7. The aortic valve is tricuspid. Aortic valve regurgitation is not visualized. Mild to moderate aortic valve sclerosis/calcification is present, without any evidence of aortic stenosis.  8. The inferior vena cava is normal in size with greater than 50% respiratory variability, suggesting right atrial pressure of 3 mmHg. FINDINGS  Left Ventricle: Left ventricular ejection fraction, by estimation, is 60 to 65%. The left ventricle has normal function. The left ventricle has no regional wall motion abnormalities. The left ventricular internal cavity size was normal in size. There is  no left ventricular hypertrophy. Left ventricular diastolic parameters are indeterminate. Right Ventricle: The right ventricular size is normal. No increase in right ventricular wall thickness. Right ventricular systolic function is normal. There is normal pulmonary artery systolic pressure. The tricuspid regurgitant velocity is 2.67 m/s, and  with an assumed right atrial pressure of 3 mmHg, the estimated right ventricular systolic pressure is 19.3 mmHg. Left Atrium: Post Watchman with 27 mm FLX not visualized on TTE. Left atrial size was mildly  dilated. Right Atrium: Right atrial size was normal in size. Pericardium: Previous trivial pericardial effusion noted over the RV free wall and transverse sinus not noted on this echo. There is no evidence of pericardial effusion. Mitral Valve: The mitral valve is abnormal. There is mild thickening of the mitral valve leaflet(s). There is mild calcification of the mitral valve leaflet(s). Moderate mitral annular calcification. Trivial mitral valve regurgitation. No evidence of mitral valve stenosis. MV peak gradient, 9.2 mmHg. The mean mitral valve gradient is 2.5 mmHg. Tricuspid Valve: The tricuspid valve is normal in structure. Tricuspid valve regurgitation is mild . No evidence of tricuspid stenosis. Aortic Valve: The aortic valve is tricuspid. Aortic valve regurgitation is not visualized. Mild to moderate aortic valve sclerosis/calcification is present, without any evidence of aortic stenosis. Pulmonic Valve: The pulmonic valve was normal in structure. Pulmonic valve regurgitation is not visualized. No evidence of pulmonic stenosis. Aorta: The aortic root is normal in size and structure. Venous: The inferior vena cava is normal in size with greater than 50% respiratory variability, suggesting right atrial pressure of 3 mmHg. IAS/Shunts: Evidence of atrial level shunting detected by color flow Doppler. LEFT VENTRICLE PLAX 2D LVIDd:         4.20 cm  Diastology LVIDs:         2.90 cm  LV e' medial:    12.00 cm/s LV PW:         1.10 cm  LV E/e' medial:  9.4 LV IVS:        1.00 cm  LV e' lateral:   9.68 cm/s LVOT diam:     2.00  cm  LV E/e' lateral: 11.7 LV SV:         47 LV SV Index:   27 LVOT Area:     3.14 cm  RIGHT VENTRICLE RV S prime:     14.50 cm/s TAPSE (M-mode): 1.5 cm LEFT ATRIUM             Index       RIGHT ATRIUM           Index LA diam:        4.00 cm 2.28 cm/m  RA Area:     12.30 cm LA Vol (A2C):   71.1 ml 40.50 ml/m RA Volume:   27.50 ml  15.66 ml/m LA Vol (A4C):   47.9 ml 27.29 ml/m LA Biplane  Vol: 59.7 ml 34.01 ml/m  AORTIC VALVE LVOT Vmax:   81.10 cm/s LVOT Vmean:  56.400 cm/s LVOT VTI:    0.149 m  AORTA Ao Root diam: 3.10 cm MITRAL VALVE                TRICUSPID VALVE MV Area (PHT): 2.85 cm     TR Peak grad:   28.5 mmHg MV Peak grad:  9.2 mmHg     TR Vmax:        267.00 cm/s MV Mean grad:  2.5 mmHg MV Vmax:       1.52 m/s     SHUNTS MV Vmean:      65.8 cm/s    Systemic VTI:  0.15 m MV Decel Time: 266 msec     Systemic Diam: 2.00 cm MV E velocity: 113.00 cm/s MV A velocity: 53.70 cm/s MV E/A ratio:  2.10 Jenkins Rouge MD Electronically signed by Jenkins Rouge MD Signature Date/Time: 07/07/2020/4:06:05 PM    Final    ECHOCARDIOGRAM LIMITED  Result Date: 07/02/2020    ECHOCARDIOGRAM LIMITED REPORT   Patient Name:   [name] Date of Exam: 07/02/2020 Medical Rec #:  [id number]        Height:       64.0 in Accession #:    [id number]       Weight:       155.0 lb Date of Birth:  1941-02-15         BSA:          1.756 m Patient Age:    30 years         BP:           124/60 mmHg Patient Gender: F                HR:           88 bpm. Exam Location:  Inpatient Procedure: Limited Echo, Cardiac Doppler and Color Doppler                            MODIFIED REPORT: This report was modified by Dorris Carnes MD on 07/02/2020 due to complete.  Indications:     Pericardial effusion  History:         Patient has prior history of Echocardiogram examinations, most                  recent 07/01/2020. Arrythmias:Atrial Fibrillation; Risk                  Factors:Hypertension, Diabetes and Dyslipidemia. 84mm Watchman  Flex Device.  Sonographer:     Dustin Flock Referring Phys:  [id number] Woodfin Ganja THOMPSON Diagnosing Phys: Dorris Carnes MD IMPRESSIONS  1. Left ventricular ejection fraction, by estimation, is 55 to 60%. The left ventricle has normal function. There is mild left ventricular hypertrophy. Left ventricular diastolic parameters were normal.  2. Right ventricular systolic function is normal. The  right ventricular size is normal. There is normal pulmonary artery systolic pressure.  3. The mitral valve is abnormal. Mild mitral valve regurgitation.  4. The aortic valve is abnormal. Mild to moderate aortic valve sclerosis/calcification is present, without any evidence of aortic stenosis.  5. The inferior vena cava is normal in size with greater than 50% respiratory variability, suggesting right atrial pressure of 3 mmHg. Comparison(s): The left ventricular function is unchanged. FINDINGS  Left Ventricle: Left ventricular ejection fraction, by estimation, is 55 to 60%. The left ventricle has normal function. The left ventricular internal cavity size was normal in size. There is mild left ventricular hypertrophy. Left ventricular diastolic  parameters were normal. Right Ventricle: The right ventricular size is normal. Right vetricular wall thickness was not assessed. Right ventricular systolic function is normal. There is normal pulmonary artery systolic pressure. The tricuspid regurgitant velocity is 2.78 m/s, and with an assumed right atrial pressure of 3 mmHg, the estimated right ventricular systolic pressure is 35.5 mmHg. Left Atrium: Left atrial size was normal in size. Right Atrium: Right atrial size was normal in size. Pericardium: There is no evidence of pericardial effusion. Mitral Valve: The mitral valve is abnormal. There is mild thickening of the mitral valve leaflet(s). Mild mitral annular calcification. Mild mitral valve regurgitation. Tricuspid Valve: The tricuspid valve is normal in structure. Tricuspid valve regurgitation is mild. Aortic Valve: The aortic valve is abnormal. Mild to moderate aortic valve sclerosis/calcification is present, without any evidence of aortic stenosis. Pulmonic Valve: The pulmonic valve was not well visualized. Venous: The inferior vena cava is normal in size with greater than 50% respiratory variability, suggesting right atrial pressure of 3 mmHg. LEFT VENTRICLE PLAX  2D LVIDd:         4.10 cm Diastology LVIDs:         3.00 cm LV e' medial:    5.22 cm/s LV PW:         1.30 cm LV E/e' medial:  22.8 LV IVS:        1.40 cm LV e' lateral:   8.27 cm/s                        LV E/e' lateral: 14.4  LEFT ATRIUM         Index LA diam:    3.40 cm 1.94 cm/m   AORTA Ao Root diam: 2.90 cm MITRAL VALVE                TRICUSPID VALVE MV Area (PHT): 4.68 cm     TR Peak grad:   30.9 mmHg MV Decel Time: 162 msec     TR Vmax:        278.00 cm/s MV E velocity: 119.00 cm/s MV A velocity: 76.60 cm/s MV E/A ratio:  1.55 Dorris Carnes MD Electronically signed by Dorris Carnes MD Signature Date/Time: 07/02/2020/2:05:55 PM    Final (Updated)      Subjective: Eager to go home  Discharge Exam: Vitals:   07/15/20 1346 07/15/20 1350  BP: (!) 148/70   Pulse: 88   Resp:    Temp:  99 F (37.2 C)   SpO2:  100%   Vitals:   07/15/20 0734 07/15/20 0844 07/15/20 1346 07/15/20 1350  BP: (!) 158/80  (!) 148/70   Pulse:  (!) 113 88   Resp:  20    Temp:  99.3 F (37.4 C) 99 F (37.2 C)   TempSrc:  Oral Oral   SpO2:  100%  100%  Weight:      Height:        General: Pt is alert, awake, not in acute distress Cardiovascular: RRR, S1/S2 +, no rubs, no gallops Respiratory: CTA bilaterally, no wheezing, no rhonchi Abdominal: Soft, NT, ND, bowel sounds + Extremities: no edema, no cyanosis   The results of significant diagnostics from this hospitalization (including imaging, microbiology, ancillary and laboratory) are listed below for reference.     Microbiology: Recent Results (from the past 240 hour(s))  Respiratory Panel by RT PCR (Flu A&B, Covid) - Nasopharyngeal Swab     Status: None   Collection Time: 07/12/20  6:15 PM   Specimen: Nasopharyngeal Swab  Result Value Ref Range Status   SARS Coronavirus 2 by RT PCR NEGATIVE NEGATIVE Final    Comment: (NOTE) SARS-CoV-2 target nucleic acids are NOT DETECTED.  The SARS-CoV-2 RNA is generally detectable in upper respiratoy specimens  during the acute phase of infection. The lowest concentration of SARS-CoV-2 viral copies this assay can detect is 131 copies/mL. A negative result does not preclude SARS-Cov-2 infection and should not be used as the sole basis for treatment or other patient management decisions. A negative result may occur with  improper specimen collection/handling, submission of specimen other than nasopharyngeal swab, presence of viral mutation(s) within the areas targeted by this assay, and inadequate number of viral copies (<131 copies/mL). A negative result must be combined with clinical observations, patient history, and epidemiological information. The expected result is Negative.  Fact Sheet for Patients:  PinkCheek.be  Fact Sheet for Healthcare Providers:  GravelBags.it  This test is no t yet approved or cleared by the [place] FDA and  has been authorized for detection and/or diagnosis of SARS-CoV-2 by FDA under an Emergency Use Authorization (EUA). This EUA will remain  in effect (meaning this test can be used) for the duration of the COVID-19 declaration under Section 564(b)(1) of the Act, 21 U.S.C. section 360bbb-3(b)(1), unless the authorization is terminated or revoked sooner.     Influenza A by PCR NEGATIVE NEGATIVE Final   Influenza B by PCR NEGATIVE NEGATIVE Final    Comment: (NOTE) The Xpert Xpress SARS-CoV-2/FLU/RSV assay is intended as an aid in  the diagnosis of influenza from Nasopharyngeal swab specimens and  should not be used as a sole basis for treatment. Nasal washings and  aspirates are unacceptable for Xpert Xpress SARS-CoV-2/FLU/RSV  testing.  Fact Sheet for Patients: PinkCheek.be  Fact Sheet for Healthcare Providers: GravelBags.it  This test is not yet approved or cleared by the [place] FDA and  has been authorized for detection and/or  diagnosis of SARS-CoV-2 by  FDA under an Emergency Use Authorization (EUA). This EUA will remain  in effect (meaning this test can be used) for the duration of the  Covid-19 declaration under Section 564(b)(1) of the Act, 21  U.S.C. section 360bbb-3(b)(1), unless the authorization is  terminated or revoked. Performed at [facility], [place] [address], [place], [place] [address]      Labs: BNP (last 3 results) No results for input(s): BNP in the last 8760 hours.  Basic Metabolic Panel: Recent Labs  Lab 07/12/20 1626 07/13/20 0418 07/14/20 0417 07/15/20 0500  NA 137 137 138 137  K 4.4 4.9 4.2 4.5  CL 100 100 96* 95*  CO2 27 26 32 29  GLUCOSE 122* 253* 195* 184*  BUN 27* 27* 21 17  CREATININE 1.14* 1.02* 0.94 0.70  CALCIUM 9.0 8.8* 9.0 9.1  MG  --   --  1.7  --    Liver Function Tests: No results for input(s): AST, ALT, ALKPHOS, BILITOT, PROT, ALBUMIN in the last 168 hours. No results for input(s): LIPASE, AMYLASE in the last 168 hours. No results for input(s): AMMONIA in the last 168 hours. CBC: Recent Labs  Lab 07/12/20 1626 07/12/20 2153 07/13/20 2133 07/14/20 0417 07/14/20 0937 07/14/20 1735 07/15/20 0500  WBC 13.5*  --   --  14.4*  --   --  12.9*  HGB 8.2*   < > 9.6* 9.6* 10.0* 8.7* 10.1*  HCT 27.6*   < > 30.7* 31.0* 32.7* 28.3* 33.3*  MCV 97.5  --   --  94.2  --   --  96.0  PLT 356  --   --  287  --   --  290   < > = values in this interval not displayed.   Cardiac Enzymes: No results for input(s): CKTOTAL, CKMB, CKMBINDEX, TROPONINI in the last 168 hours. BNP: Invalid input(s): POCBNP CBG: Recent Labs  Lab 07/14/20 1702 07/14/20 1723 07/14/20 2136 07/15/20 0727 07/15/20 1118  GLUCAP 111* 111* 235* 171* 215*   D-Dimer No results for input(s): DDIMER in the last 72 hours. Hgb A1c No results for input(s): HGBA1C in the last 72 hours. Lipid Profile No results for input(s): CHOL, HDL, LDLCALC, TRIG, CHOLHDL, LDLDIRECT in the last  72 hours. Thyroid function studies No results for input(s): TSH, T4TOTAL, T3FREE, THYROIDAB in the last 72 hours.  Invalid input(s): FREET3 Anemia work up No results for input(s): VITAMINB12, FOLATE, FERRITIN, TIBC, IRON, RETICCTPCT in the last 72 hours. Urinalysis    Component Value Date/Time   COLORURINE YELLOW 01/27/2020 1155   APPEARANCEUR CLEAR 01/27/2020 1155   LABSPEC 1.014 01/27/2020 1155   PHURINE 5.0 01/27/2020 1155   GLUCOSEU NEGATIVE 01/27/2020 1155   HGBUR NEGATIVE 01/27/2020 1155   BILIRUBINUR NEGATIVE 01/27/2020 1155   KETONESUR NEGATIVE 01/27/2020 1155   PROTEINUR NEGATIVE 01/27/2020 1155   UROBILINOGEN 0.2 03/20/2011 2257   NITRITE NEGATIVE 01/27/2020 1155   LEUKOCYTESUR NEGATIVE 01/27/2020 1155   Sepsis Labs Invalid input(s): PROCALCITONIN,  WBC,  LACTICIDVEN Microbiology Recent Results (from the past 240 hour(s))  Respiratory Panel by RT PCR (Flu A&B, Covid) - Nasopharyngeal Swab     Status: None   Collection Time: 07/12/20  6:15 PM   Specimen: Nasopharyngeal Swab  Result Value Ref Range Status   SARS Coronavirus 2 by RT PCR NEGATIVE NEGATIVE Final    Comment: (NOTE) SARS-CoV-2 target nucleic acids are NOT DETECTED.  The SARS-CoV-2 RNA is generally detectable in upper respiratoy specimens during the acute phase of infection. The lowest concentration of SARS-CoV-2 viral copies this assay can detect is 131 copies/mL. A negative result does not preclude SARS-Cov-2 infection and should not be used as the sole basis for treatment or other patient management decisions. A negative result may occur with  improper specimen collection/handling, submission of specimen other than nasopharyngeal swab, presence of viral mutation(s) within the areas targeted by this assay, and inadequate number of viral copies (<131 copies/mL). A negative result must be combined  with clinical observations, patient history, and epidemiological information. The expected result is  Negative.  Fact Sheet for Patients:  PinkCheek.be  Fact Sheet for Healthcare Providers:  GravelBags.it  This test is no t yet approved or cleared by the [place] FDA and  has been authorized for detection and/or diagnosis of SARS-CoV-2 by FDA under an Emergency Use Authorization (EUA). This EUA will remain  in effect (meaning this test can be used) for the duration of the COVID-19 declaration under Section 564(b)(1) of the Act, 21 U.S.C. section 360bbb-3(b)(1), unless the authorization is terminated or revoked sooner.     Influenza A by PCR NEGATIVE NEGATIVE Final   Influenza B by PCR NEGATIVE NEGATIVE Final    Comment: (NOTE) The Xpert Xpress SARS-CoV-2/FLU/RSV assay is intended as an aid in  the diagnosis of influenza from Nasopharyngeal swab specimens and  should not be used as a sole basis for treatment. Nasal washings and  aspirates are unacceptable for Xpert Xpress SARS-CoV-2/FLU/RSV  testing.  Fact Sheet for Patients: PinkCheek.be  Fact Sheet for Healthcare Providers: GravelBags.it  This test is not yet approved or cleared by the [place] FDA and  has been authorized for detection and/or diagnosis of SARS-CoV-2 by  FDA under an Emergency Use Authorization (EUA). This EUA will remain  in effect (meaning this test can be used) for the duration of the  Covid-19 declaration under Section 564(b)(1) of the Act, 21  U.S.C. section 360bbb-3(b)(1), unless the authorization is  terminated or revoked. Performed at [facility], [place] [address], [place],  [address]    Time spent: 30 min  SIGNED:   Marylu Lund, MD  [facility] 07/15/2020, 3:17 PM  If 7PM-7AM, please contact night-coverage

## 2020-07-15 NOTE — Care Management Important Message (Signed)
Important Message  Patient Details IM Letter given to the Patient. Name: [name] MRN: [id number] Date of Birth: 1941/08/02   Medicare Important Message Given:  Yes     Kerin Salen 07/15/2020, 11:33 AM

## 2020-07-15 NOTE — Progress Notes (Signed)
SATURATION QUALIFICATIONS: (This note is used to comply with regulatory documentation for home oxygen)  Patient Saturations on Room Air at Rest = 87%    Patient Saturations on 2 Liters of oxygen while at Rest = 94%  Please briefly explain why patient needs home oxygen:  Patient unable to maintain oxygen saturation without oxygen

## 2020-07-16 LAB — TYPE AND SCREEN
ABO/RH(D): A POS
Antibody Screen: NEGATIVE
Unit division: 0
Unit division: 0
Unit division: 0

## 2020-07-16 LAB — BPAM RBC
Blood Product Expiration Date: 202112032359
Blood Product Expiration Date: 202112032359
Blood Product Expiration Date: 202112032359
ISSUE DATE / TIME: 202111160842
Unit Type and Rh: 6200
Unit Type and Rh: 6200
Unit Type and Rh: 6200

## 2020-07-16 IMAGING — RF DG C-ARM 1-60 MIN
1 series · 1 of 1 positions shown · non-contrast
Comparison: January 01, 2020.

CLINICAL DATA: T8 kyphoplasty.

EXAM:
THORACIC SPINE 2 VIEWS; DG C-ARM 1-60 MIN
Radiation exposure index: 25.08 mGy

[Series 1: run · 1 of 1 slices shown]
[im 1/1]
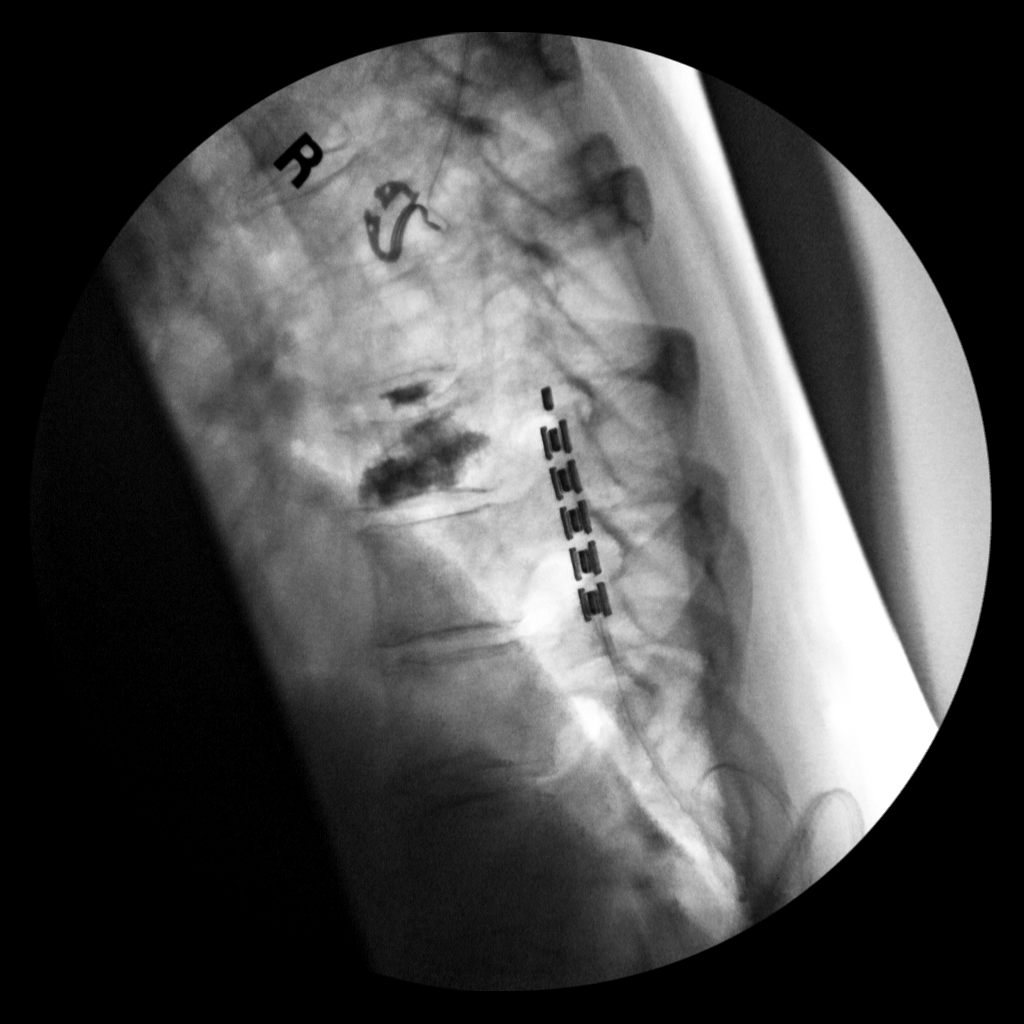

[1 of 1 positions shown; findings below may reference images not displayed]

FINDINGS: Two intraoperative fluoroscopic images were obtained of the lower
thoracic spine. These images demonstrate the patient be status post
kyphoplasty of what appears to be the T8 vertebral body based on
prior CT scan.
IMPRESSION: Fluoroscopic guidance provided during T8 kyphoplasty.

## 2020-07-16 IMAGING — RF DG THORACIC SPINE 2V
1 series · 1 of 1 positions shown · non-contrast
Comparison: January 01, 2020.

CLINICAL DATA: T8 kyphoplasty.

EXAM:
THORACIC SPINE 2 VIEWS; DG C-ARM 1-60 MIN
Radiation exposure index: 25.08 mGy

[Series 1: run · 1 of 1 slices shown]
[im 1/1]
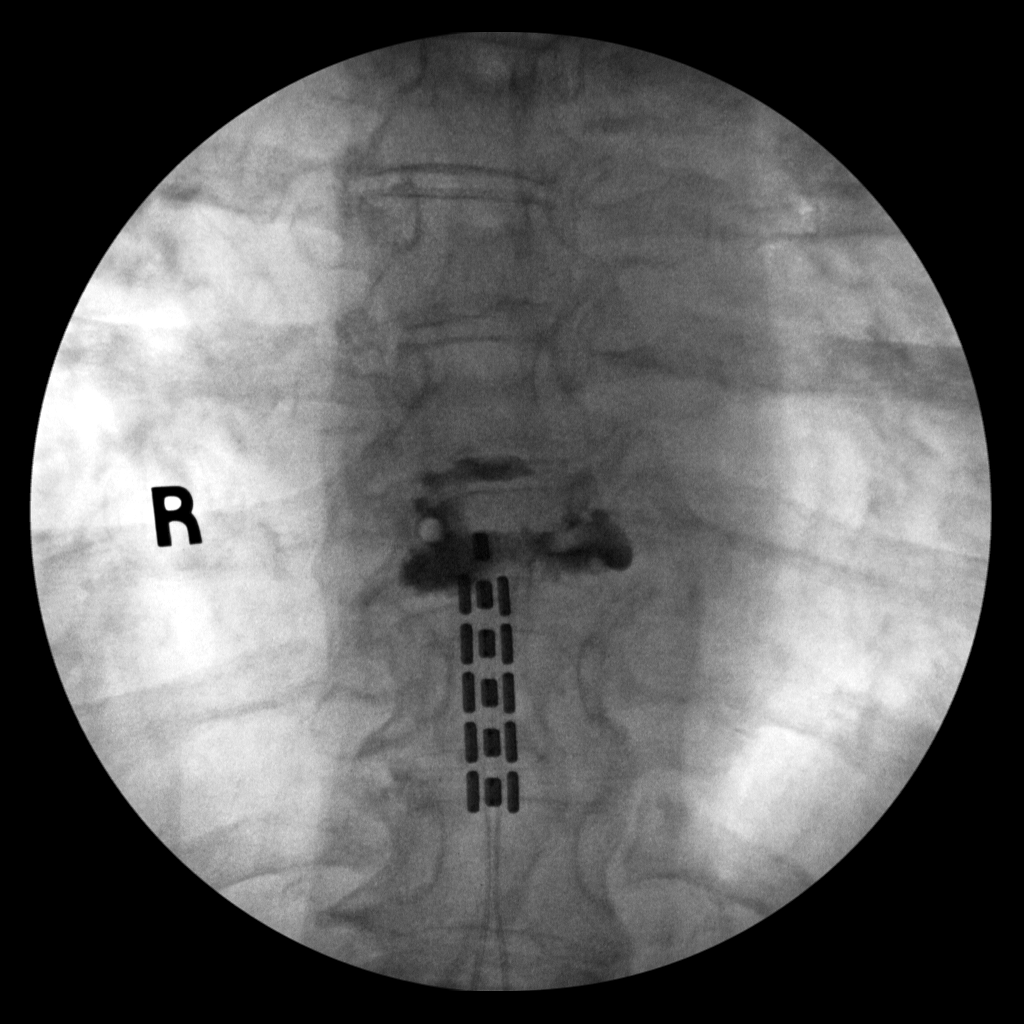

[1 of 1 positions shown; findings below may reference images not displayed]

FINDINGS: Two intraoperative fluoroscopic images were obtained of the lower
thoracic spine. These images demonstrate the patient be status post
kyphoplasty of what appears to be the T8 vertebral body based on
prior CT scan.
IMPRESSION: Fluoroscopic guidance provided during T8 kyphoplasty.

## 2020-07-16 NOTE — Anesthesia Postprocedure Evaluation (Signed)
Anesthesia Post Note  Patient: LABREA ECCLESTON  Procedure(s) Performed: ESOPHAGOGASTRODUODENOSCOPY (EGD) WITH PROPOFOL (N/A ) HOT HEMOSTASIS (ARGON PLASMA COAGULATION/BICAP) (N/A ) BIOPSY     Patient location during evaluation: Endoscopy Anesthesia Type: MAC Level of consciousness: awake and alert Pain management: pain level controlled Vital Signs Assessment: post-procedure vital signs reviewed and stable Respiratory status: spontaneous breathing, nonlabored ventilation, respiratory function stable and patient connected to nasal cannula oxygen Cardiovascular status: blood pressure returned to baseline and stable Postop Assessment: no apparent nausea or vomiting Anesthetic complications: no   No complications documented.  Last Vitals:  Vitals:   07/15/20 1346 07/15/20 1350  BP: (!) 148/70   Pulse: 88   Resp:    Temp: 37.2 C   SpO2:  100%    Last Pain:  Vitals:   07/15/20 1346  TempSrc: Oral  PainSc:                  Nisaiah Bechtol L Delmont Prosch

## 2020-07-18 ENCOUNTER — Encounter (HOSPITAL_COMMUNITY): Payer: Self-pay | Admitting: Gastroenterology

## 2020-07-19 ENCOUNTER — Other Ambulatory Visit: Payer: Self-pay

## 2020-07-19 ENCOUNTER — Ambulatory Visit: Payer: Medicare PPO | Admitting: Podiatry

## 2020-07-19 DIAGNOSIS — I130 Hypertensive heart and chronic kidney disease with heart failure and stage 1 through stage 4 chronic kidney disease, or unspecified chronic kidney disease: Secondary | ICD-10-CM | POA: Diagnosis not present

## 2020-07-19 DIAGNOSIS — E1122 Type 2 diabetes mellitus with diabetic chronic kidney disease: Secondary | ICD-10-CM | POA: Diagnosis not present

## 2020-07-19 DIAGNOSIS — K31819 Angiodysplasia of stomach and duodenum without bleeding: Secondary | ICD-10-CM | POA: Diagnosis not present

## 2020-07-19 DIAGNOSIS — D500 Iron deficiency anemia secondary to blood loss (chronic): Secondary | ICD-10-CM | POA: Diagnosis not present

## 2020-07-19 DIAGNOSIS — D62 Acute posthemorrhagic anemia: Secondary | ICD-10-CM | POA: Diagnosis not present

## 2020-07-19 DIAGNOSIS — I5030 Unspecified diastolic (congestive) heart failure: Secondary | ICD-10-CM | POA: Diagnosis not present

## 2020-07-19 DIAGNOSIS — K922 Gastrointestinal hemorrhage, unspecified: Secondary | ICD-10-CM | POA: Diagnosis not present

## 2020-07-19 DIAGNOSIS — K2100 Gastro-esophageal reflux disease with esophagitis, without bleeding: Secondary | ICD-10-CM | POA: Diagnosis not present

## 2020-07-19 DIAGNOSIS — N183 Chronic kidney disease, stage 3 unspecified: Secondary | ICD-10-CM | POA: Diagnosis not present

## 2020-07-20 DIAGNOSIS — Z8719 Personal history of other diseases of the digestive system: Secondary | ICD-10-CM | POA: Diagnosis not present

## 2020-07-20 DIAGNOSIS — E878 Other disorders of electrolyte and fluid balance, not elsewhere classified: Secondary | ICD-10-CM | POA: Diagnosis not present

## 2020-07-20 DIAGNOSIS — Z95818 Presence of other cardiac implants and grafts: Secondary | ICD-10-CM | POA: Diagnosis not present

## 2020-07-20 DIAGNOSIS — N183 Chronic kidney disease, stage 3 unspecified: Secondary | ICD-10-CM | POA: Diagnosis not present

## 2020-07-20 DIAGNOSIS — I10 Essential (primary) hypertension: Secondary | ICD-10-CM | POA: Diagnosis not present

## 2020-07-20 DIAGNOSIS — D62 Acute posthemorrhagic anemia: Secondary | ICD-10-CM | POA: Diagnosis not present

## 2020-07-20 DIAGNOSIS — Z7901 Long term (current) use of anticoagulants: Secondary | ICD-10-CM | POA: Diagnosis not present

## 2020-07-21 LAB — SURGICAL PATHOLOGY

## 2020-07-22 ENCOUNTER — Other Ambulatory Visit: Payer: Self-pay

## 2020-07-22 ENCOUNTER — Telehealth: Payer: Self-pay | Admitting: Physician Assistant

## 2020-07-22 ENCOUNTER — Encounter (HOSPITAL_COMMUNITY): Payer: Self-pay | Admitting: Emergency Medicine

## 2020-07-22 ENCOUNTER — Inpatient Hospital Stay (HOSPITAL_COMMUNITY)
Admission: EM | Admit: 2020-07-22 | Discharge: 2020-07-27 | DRG: 291 | Disposition: A | Discharge status: Home Health | Payer: Medicare PPO | Attending: Internal Medicine | Admitting: Internal Medicine

## 2020-07-22 ENCOUNTER — Emergency Department (HOSPITAL_COMMUNITY): Payer: Medicare PPO

## 2020-07-22 DIAGNOSIS — I509 Heart failure, unspecified: Secondary | ICD-10-CM | POA: Diagnosis not present

## 2020-07-22 DIAGNOSIS — K2100 Gastro-esophageal reflux disease with esophagitis, without bleeding: Secondary | ICD-10-CM | POA: Diagnosis present

## 2020-07-22 DIAGNOSIS — J811 Chronic pulmonary edema: Secondary | ICD-10-CM | POA: Diagnosis not present

## 2020-07-22 DIAGNOSIS — I10 Essential (primary) hypertension: Secondary | ICD-10-CM | POA: Diagnosis present

## 2020-07-22 DIAGNOSIS — J9811 Atelectasis: Secondary | ICD-10-CM | POA: Diagnosis present

## 2020-07-22 DIAGNOSIS — E871 Hypo-osmolality and hyponatremia: Secondary | ICD-10-CM | POA: Diagnosis present

## 2020-07-22 DIAGNOSIS — E876 Hypokalemia: Secondary | ICD-10-CM | POA: Diagnosis not present

## 2020-07-22 DIAGNOSIS — J9621 Acute and chronic respiratory failure with hypoxia: Secondary | ICD-10-CM | POA: Diagnosis present

## 2020-07-22 DIAGNOSIS — E1151 Type 2 diabetes mellitus with diabetic peripheral angiopathy without gangrene: Secondary | ICD-10-CM | POA: Diagnosis present

## 2020-07-22 DIAGNOSIS — Z888 Allergy status to other drugs, medicaments and biological substances status: Secondary | ICD-10-CM | POA: Diagnosis not present

## 2020-07-22 DIAGNOSIS — I4891 Unspecified atrial fibrillation: Secondary | ICD-10-CM | POA: Diagnosis not present

## 2020-07-22 DIAGNOSIS — R0602 Shortness of breath: Secondary | ICD-10-CM | POA: Diagnosis not present

## 2020-07-22 DIAGNOSIS — Z20822 Contact with and (suspected) exposure to covid-19: Secondary | ICD-10-CM | POA: Diagnosis not present

## 2020-07-22 DIAGNOSIS — E1122 Type 2 diabetes mellitus with diabetic chronic kidney disease: Secondary | ICD-10-CM | POA: Diagnosis present

## 2020-07-22 DIAGNOSIS — Z95818 Presence of other cardiac implants and grafts: Secondary | ICD-10-CM | POA: Diagnosis not present

## 2020-07-22 DIAGNOSIS — Z79899 Other long term (current) drug therapy: Secondary | ICD-10-CM | POA: Diagnosis not present

## 2020-07-22 DIAGNOSIS — Z91048 Other nonmedicinal substance allergy status: Secondary | ICD-10-CM

## 2020-07-22 DIAGNOSIS — R069 Unspecified abnormalities of breathing: Secondary | ICD-10-CM | POA: Diagnosis not present

## 2020-07-22 DIAGNOSIS — E785 Hyperlipidemia, unspecified: Secondary | ICD-10-CM | POA: Diagnosis present

## 2020-07-22 DIAGNOSIS — K922 Gastrointestinal hemorrhage, unspecified: Secondary | ICD-10-CM | POA: Diagnosis not present

## 2020-07-22 DIAGNOSIS — I480 Paroxysmal atrial fibrillation: Secondary | ICD-10-CM | POA: Diagnosis present

## 2020-07-22 DIAGNOSIS — Z882 Allergy status to sulfonamides status: Secondary | ICD-10-CM | POA: Diagnosis not present

## 2020-07-22 DIAGNOSIS — I130 Hypertensive heart and chronic kidney disease with heart failure and stage 1 through stage 4 chronic kidney disease, or unspecified chronic kidney disease: Secondary | ICD-10-CM | POA: Diagnosis not present

## 2020-07-22 DIAGNOSIS — R0689 Other abnormalities of breathing: Secondary | ICD-10-CM | POA: Diagnosis not present

## 2020-07-22 DIAGNOSIS — F419 Anxiety disorder, unspecified: Secondary | ICD-10-CM | POA: Diagnosis present

## 2020-07-22 DIAGNOSIS — R062 Wheezing: Secondary | ICD-10-CM | POA: Diagnosis not present

## 2020-07-22 DIAGNOSIS — Z7982 Long term (current) use of aspirin: Secondary | ICD-10-CM | POA: Diagnosis not present

## 2020-07-22 DIAGNOSIS — N182 Chronic kidney disease, stage 2 (mild): Secondary | ICD-10-CM | POA: Diagnosis present

## 2020-07-22 DIAGNOSIS — J9620 Acute and chronic respiratory failure, unspecified whether with hypoxia or hypercapnia: Secondary | ICD-10-CM | POA: Diagnosis not present

## 2020-07-22 DIAGNOSIS — Z7901 Long term (current) use of anticoagulants: Secondary | ICD-10-CM | POA: Diagnosis not present

## 2020-07-22 DIAGNOSIS — Z794 Long term (current) use of insulin: Secondary | ICD-10-CM | POA: Diagnosis not present

## 2020-07-22 DIAGNOSIS — E7800 Pure hypercholesterolemia, unspecified: Secondary | ICD-10-CM | POA: Diagnosis not present

## 2020-07-22 DIAGNOSIS — Z8249 Family history of ischemic heart disease and other diseases of the circulatory system: Secondary | ICD-10-CM | POA: Diagnosis not present

## 2020-07-22 DIAGNOSIS — I5043 Acute on chronic combined systolic (congestive) and diastolic (congestive) heart failure: Secondary | ICD-10-CM | POA: Diagnosis present

## 2020-07-22 DIAGNOSIS — Z8719 Personal history of other diseases of the digestive system: Secondary | ICD-10-CM | POA: Diagnosis not present

## 2020-07-22 DIAGNOSIS — D500 Iron deficiency anemia secondary to blood loss (chronic): Secondary | ICD-10-CM | POA: Diagnosis present

## 2020-07-22 DIAGNOSIS — J90 Pleural effusion, not elsewhere classified: Secondary | ICD-10-CM | POA: Diagnosis not present

## 2020-07-22 DIAGNOSIS — I110 Hypertensive heart disease with heart failure: Secondary | ICD-10-CM | POA: Diagnosis not present

## 2020-07-22 DIAGNOSIS — J45909 Unspecified asthma, uncomplicated: Secondary | ICD-10-CM | POA: Diagnosis present

## 2020-07-22 DIAGNOSIS — I499 Cardiac arrhythmia, unspecified: Secondary | ICD-10-CM | POA: Diagnosis not present

## 2020-07-22 DIAGNOSIS — Z885 Allergy status to narcotic agent status: Secondary | ICD-10-CM | POA: Diagnosis not present

## 2020-07-22 DIAGNOSIS — Z96652 Presence of left artificial knee joint: Secondary | ICD-10-CM | POA: Diagnosis present

## 2020-07-22 DIAGNOSIS — E119 Type 2 diabetes mellitus without complications: Secondary | ICD-10-CM

## 2020-07-22 LAB — URINALYSIS, ROUTINE W REFLEX MICROSCOPIC
Bilirubin Urine: NEGATIVE
Glucose, UA: NEGATIVE mg/dL
Hgb urine dipstick: NEGATIVE
Ketones, ur: NEGATIVE mg/dL
Leukocytes,Ua: NEGATIVE
Nitrite: NEGATIVE
Protein, ur: NEGATIVE mg/dL
Specific Gravity, Urine: 1.011 (ref 1.005–1.030)
pH: 6 (ref 5.0–8.0)

## 2020-07-22 LAB — CBG MONITORING, ED: Glucose-Capillary: 99 mg/dL (ref 70–99)

## 2020-07-22 LAB — CBC WITH DIFFERENTIAL/PLATELET
Abs Immature Granulocytes: 0.1 10*3/uL — ABNORMAL HIGH (ref 0.00–0.07)
Basophils Absolute: 0.1 10*3/uL (ref 0.0–0.1)
Basophils Relative: 1 %
Eosinophils Absolute: 0.1 10*3/uL (ref 0.0–0.5)
Eosinophils Relative: 0 %
HCT: 31.9 % — ABNORMAL LOW (ref 36.0–46.0)
Hemoglobin: 9.3 g/dL — ABNORMAL LOW (ref 12.0–15.0)
Immature Granulocytes: 1 %
Lymphocytes Relative: 6 %
Lymphs Abs: 1 10*3/uL (ref 0.7–4.0)
MCH: 27.4 pg (ref 26.0–34.0)
MCHC: 29.2 g/dL — ABNORMAL LOW (ref 30.0–36.0)
MCV: 94.1 fL (ref 80.0–100.0)
Monocytes Absolute: 1.2 10*3/uL — ABNORMAL HIGH (ref 0.1–1.0)
Monocytes Relative: 8 %
Neutro Abs: 13.2 10*3/uL — ABNORMAL HIGH (ref 1.7–7.7)
Neutrophils Relative %: 84 %
Platelets: 474 10*3/uL — ABNORMAL HIGH (ref 150–400)
RBC: 3.39 MIL/uL — ABNORMAL LOW (ref 3.87–5.11)
RDW: 14.7 % (ref 11.5–15.5)
WBC: 15.6 10*3/uL — ABNORMAL HIGH (ref 4.0–10.5)
nRBC: 0 % (ref 0.0–0.2)

## 2020-07-22 LAB — COMPREHENSIVE METABOLIC PANEL
ALT: 21 U/L (ref 0–44)
AST: 19 U/L (ref 15–41)
Albumin: 3.6 g/dL (ref 3.5–5.0)
Alkaline Phosphatase: 86 U/L (ref 38–126)
Anion gap: 12 (ref 5–15)
BUN: 16 mg/dL (ref 8–23)
CO2: 26 mmol/L (ref 22–32)
Calcium: 9.2 mg/dL (ref 8.9–10.3)
Chloride: 99 mmol/L (ref 98–111)
Creatinine, Ser: 0.95 mg/dL (ref 0.44–1.00)
GFR, Estimated: >60 mL/min (ref >60)
Glucose, Bld: 106 mg/dL — ABNORMAL HIGH (ref 70–99)
Potassium: 4.1 mmol/L (ref 3.5–5.1)
Sodium: 137 mmol/L (ref 135–145)
Total Bilirubin: 0.7 mg/dL (ref 0.3–1.2)
Total Protein: 6.8 g/dL (ref 6.5–8.1)

## 2020-07-22 LAB — I-STAT ARTERIAL BLOOD GAS, ED
Acid-Base Excess: 5 mmol/L — ABNORMAL HIGH (ref 0.0–2.0)
Bicarbonate: 30.9 mmol/L — ABNORMAL HIGH (ref 20.0–28.0)
Calcium, Ion: 1.26 mmol/L (ref 1.15–1.40)
HCT: 29 % — ABNORMAL LOW (ref 36.0–46.0)
Hemoglobin: 9.9 g/dL — ABNORMAL LOW (ref 12.0–15.0)
O2 Saturation: 99 %
Patient temperature: 98.6
Potassium: 3.9 mmol/L (ref 3.5–5.1)
Sodium: 139 mmol/L (ref 135–145)
TCO2: 32 mmol/L (ref 22–32)
pCO2 arterial: 49.4 mmHg — ABNORMAL HIGH (ref 32.0–48.0)
pH, Arterial: 7.404 (ref 7.350–7.450)
pO2, Arterial: 121 mmHg — ABNORMAL HIGH (ref 83.0–108.0)

## 2020-07-22 LAB — TYPE AND SCREEN
ABO/RH(D): A POS
Antibody Screen: NEGATIVE

## 2020-07-22 LAB — GLUCOSE, CAPILLARY: Glucose-Capillary: 85 mg/dL (ref 70–99)

## 2020-07-22 LAB — RESP PANEL BY RT-PCR (FLU A&B, COVID) ARPGX2
Influenza A by PCR: NEGATIVE
Influenza B by PCR: NEGATIVE
SARS Coronavirus 2 by RT PCR: NEGATIVE

## 2020-07-22 LAB — TROPONIN I (HIGH SENSITIVITY)
Troponin I (High Sensitivity): 11 ng/L (ref <18)
Troponin I (High Sensitivity): 11 ng/L (ref <18)

## 2020-07-22 LAB — BRAIN NATRIURETIC PEPTIDE: B Natriuretic Peptide: 347.4 pg/mL — ABNORMAL HIGH (ref 0.0–100.0)

## 2020-07-22 MED ORDER — ACETAMINOPHEN 325 MG PO TABS
650.0000 mg | ORAL_TABLET | ORAL | Status: DC | PRN
  Administered 2020-07-24 – 2020-07-27 (×3): 650 mg via ORAL
  Filled 2020-07-22 (×3): qty 2

## 2020-07-22 MED ORDER — SODIUM CHLORIDE 0.9 % IV SOLN
250.0000 mL | INTRAVENOUS | Status: DC | PRN
  Administered 2020-07-25: 250 mL via INTRAVENOUS

## 2020-07-22 MED ORDER — APIXABAN 5 MG PO TABS
5.0000 mg | ORAL_TABLET | Freq: Two times a day (BID) | ORAL | Status: DC
  Administered 2020-07-22 – 2020-07-27 (×10): 5 mg via ORAL
  Filled 2020-07-22 (×10): qty 1

## 2020-07-22 MED ORDER — FUROSEMIDE 10 MG/ML IJ SOLN
60.0000 mg | Freq: Two times a day (BID) | INTRAMUSCULAR | Status: DC
  Administered 2020-07-23: 60 mg via INTRAVENOUS
  Filled 2020-07-22: qty 6

## 2020-07-22 MED ORDER — FUROSEMIDE 10 MG/ML IJ SOLN
40.0000 mg | Freq: Once | INTRAMUSCULAR | Status: AC
  Administered 2020-07-22: 40 mg via INTRAVENOUS
  Filled 2020-07-22: qty 4

## 2020-07-22 MED ORDER — SODIUM CHLORIDE 0.9% FLUSH: 3.0000 mL | INTRAVENOUS | Status: DC | PRN

## 2020-07-22 MED ORDER — ONDANSETRON HCL 4 MG/2ML IJ SOLN
4.0000 mg | Freq: Four times a day (QID) | INTRAMUSCULAR | Status: DC | PRN
  Administered 2020-07-23: 4 mg via INTRAVENOUS
  Filled 2020-07-22: qty 2

## 2020-07-22 MED ORDER — SODIUM CHLORIDE 0.9% FLUSH
3.0000 mL | Freq: Two times a day (BID) | INTRAVENOUS | Status: DC
  Administered 2020-07-22 – 2020-07-26 (×7): 3 mL via INTRAVENOUS

## 2020-07-22 NOTE — Progress Notes (Signed)
ABG attempted 2x unsuccessfully. I will ask another RT to attempt.  

## 2020-07-22 NOTE — Progress Notes (Signed)
RT note. Pt. Transported from ED to [place] rm 4 without any complications.

## 2020-07-22 NOTE — Telephone Encounter (Signed)
   The patient's husband [name] called the answering service after-hours today on behalf of his wife. Chart reviewed - recent admission for symptomatic anemia and presumed upper GIB. Discharged on O2. She had done fairly well post-discharge and they felt she was gettings tronger. Last night, however, she developed abrupt SOB, orthopnea, heart racing. They considered calling EMS but did not want to go sit in the emergency room. Throughout the day she's just felt terrible. She has not been able to eat and is SOB at rest even with supplemental O2. I am very concerned that she warrants urgent medical attention and recommended that they call 911 immediately to summon EMS out for urgent evaluation and transfer to the hospital. There are a multitude of potential causes for her presentation which require in-person evaluation rather than simple medication adjustment over the phone. Her husband verbalized understanding and gratitude and plans to call 911 upon hanging up.  Charlie Pitter, PA-C

## 2020-07-22 NOTE — ED Triage Notes (Signed)
Pt presents to ED BIB [facility] from home. Pt c/o resp distress since last night. Pt RA sat > 90% but pt SOB, tachynpiec, using accessory muscles. EMS placed pt on CPAP. Pt's resp improved. Pt recently d/c for fluid overload. Placed on BiPaP on arrival.

## 2020-07-22 NOTE — ED Provider Notes (Addendum)
[facility] EMERGENCY DEPARTMENT Provider Note   CSN: [id number] Arrival date & time: 07/22/20  1613     History Chief Complaint  Patient presents with  . Respiratory Distress    [name] is a 79 y.o. female.  HPI 79 year old female presents with acute dyspnea.  This is a recurrent issue and very similar to when she presented just over a week ago.  She was discharged exactly 1 week ago.  She developed shortness of breath last night that has progressively worsened.  EMS was called and found her to have significant work of breathing though normal O2 sats.  Placed on CPAP and given 3 nebs with improvement.  She feels like she has become swollen over the last couple days in her legs and face.  No chest pain, cough, or fever.   Past Medical History:  Diagnosis Date  . Acute GI bleeding 05/13/2015  . Anxiety   . Arthritis   . Asthma   . Atrial fibrillation ([place]) 10/29/2017  . Bakers cyst, left   . Chronic kidney disease    stage 3  . Constipation   . Diabetes mellitus    type 2  . Dysrhythmia   . GERD (gastroesophageal reflux disease)   . Heart murmur   . History of cellulitis   . History of dizziness   . History of hiatal hernia    tiny 06/15/2016  . Hypertension   . Iron deficiency anemia   . Peripheral vascular disease ([place])   . PONV (postoperative nausea and vomiting)   . Pulmonary nodule     Patient Active Problem List   Diagnosis Date Noted  . Acute on chronic respiratory failure with hypoxemia ([place]) 07/22/2020  . SOB (shortness of breath) 07/12/2020  . ABLA (acute blood loss anemia) 07/12/2020  . Persistent atrial fibrillation ([place]) 07/01/2020  . Paroxysmal atrial fibrillation ([place]) 04/14/2020  . S/P left TKA 11/05/2017  . S/P total knee replacement 11/05/2017  . Diabetes mellitus ([place]) 12/25/2016  . Hypercholesterolemia 12/25/2016  . Ptosis of eyelid 10/09/2016  . Iron deficiency anemia 12/15/2015  . Acute GI bleeding 05/13/2015   . Acute blood loss anemia 05/13/2015  . Normocytic anemia 05/13/2015  . Allergic rhinitis 04/06/2015  . Asthma, chronic 09/08/2013  . Hypoxia 09/08/2013  . HTN (hypertension) 09/08/2013  . Depression 09/08/2013  . DM type 2 with diabetic peripheral neuropathy ([place]) 09/08/2013  . Flu-like symptoms 09/07/2013    Past Surgical History:  Procedure Laterality Date  . APPENDECTOMY  1952  . BIOPSY  10/16/2018   Procedure: BIOPSY;  Surgeon: Clarene Essex, MD;  Location: [facility];  Service: Endoscopy;;  . BIOPSY  11/26/2019   Procedure: BIOPSY;  Surgeon: Clarene Essex, MD;  Location: [facility];  Service: Endoscopy;;  . BIOPSY  07/14/2020   Procedure: BIOPSY;  Surgeon: Ronald Lobo, MD;  Location: [facility];  Service: Endoscopy;;  . COLONOSCOPY W/ POLYPECTOMY  05/15/2013  . ESOPHAGOGASTRODUODENOSCOPY (EGD) WITH PROPOFOL N/A 06/15/2016   Procedure: ESOPHAGOGASTRODUODENOSCOPY (EGD) WITH PROPOFOL;  Surgeon: Clarene Essex, MD;  Location: [facility];  Service: Endoscopy;  Laterality: N/A;  . ESOPHAGOGASTRODUODENOSCOPY (EGD) WITH PROPOFOL N/A 10/16/2018   Procedure: ESOPHAGOGASTRODUODENOSCOPY (EGD) WITH PROPOFOL;  Surgeon: Clarene Essex, MD;  [facility]: [facility];  Service: Endoscopy;  Laterality: N/A;  . ESOPHAGOGASTRODUODENOSCOPY (EGD) WITH PROPOFOL N/A 11/26/2019   Procedure: ESOPHAGOGASTRODUODENOSCOPY (EGD) WITH PROPOFOL;  Surgeon: Clarene Essex, MD;  [facility]: [facility];  Service: Endoscopy;  Laterality: N/A;  . ESOPHAGOGASTRODUODENOSCOPY (EGD) WITH PROPOFOL N/A  07/14/2020   Procedure: ESOPHAGOGASTRODUODENOSCOPY (EGD) WITH PROPOFOL;  Surgeon: Ronald Lobo, MD;  [facility];  Service: Endoscopy;  Laterality: N/A;  . EYE SURGERY Bilateral    cataracts removed  . GI RADIOFREQUENCY ABLATION  10/16/2018   Procedure: GI RADIOFREQUENCY ABLATION;  Surgeon: Clarene Essex, MD;  Location: [facility];  Service: Endoscopy;;  . GI RADIOFREQUENCY ABLATION N/A 11/26/2019   Procedure: GI  RADIOFREQUENCY ABLATION;  Surgeon: Clarene Essex, MD;  Location: [facility];  Service: Endoscopy;  Laterality: N/A;  . HAND SURGERY Left 2010   operated on index and ring finger  . HOT HEMOSTASIS N/A 07/14/2020   Procedure: HOT HEMOSTASIS (ARGON PLASMA COAGULATION/BICAP);  Surgeon: Ronald Lobo, MD;  Location: [facility];  Service: Endoscopy;  Laterality: N/A;  . INCONTINENCE SURGERY    . JOINT REPLACEMENT Right    knee  . KNEE ARTHROSCOPY  2003 2005   right knee  . KYPHOPLASTY N/A 01/29/2020   Procedure: T8 KYPHOPLASTY;  Surgeon: Melina Schools, MD;  Location: [facility];  Service: Orthopedics;  Laterality: N/A;  60 mins Local with [facility]  . LEFT ATRIAL APPENDAGE OCCLUSION N/A 07/01/2020   Procedure: LEFT ATRIAL APPENDAGE OCCLUSION;  Surgeon: Vickie Epley, MD;  Location: [facility];  Service: Cardiovascular;  Laterality: N/A;  . mass fallopian tube    . SPINAL CORD STIMULATOR BATTERY EXCHANGE N/A 09/09/2015   Procedure: SPINAL CORD STIMULATOR BATTERY EXCHANGE;  Surgeon: Melina Schools, MD;  Location: [facility];  Service: Orthopedics;  Laterality: N/A;  . TOTAL KNEE ARTHROPLASTY Left 11/05/2017   Procedure: LEFT TOTAL KNEE ARTHROPLASTY;  Surgeon: Paralee Cancel, MD;  Location: [facility];  Service: Orthopedics;  Laterality: Left;  Adductor Block  . VAGINAL HYSTERECTOMY  1977   1 ovary removed     OB History   No obstetric history on file.     Family History  Problem Relation Age of Onset  . Lung cancer Mother   . Heart attack Father   . Colon cancer Neg Hx   . Stroke Neg Hx   . Migraines Neg Hx     Social History   Tobacco Use  . Smoking status: Never Smoker  . Smokeless tobacco: Never Used  Vaping Use  . Vaping Use: Never used  Substance Use Topics  . Alcohol use: No  . Drug use: No    Home Medications Prior to Admission medications   Medication Sig Start Date End Date Taking? Authorizing Provider  acetaminophen (TYLENOL) 500 MG tablet Take 1,000 mg by mouth  every 8 (eight) hours as needed for moderate pain.    [provider]  albuterol (PROAIR HFA) 108 (90 BASE) MCG/ACT inhaler Inhale 2 puffs into the lungs every 4 (four) hours as needed for wheezing or shortness of breath. 02/02/15   Collene Gobble, MD  ALPRAZolam Duanne Moron) 0.25 MG tablet Take 1 tablet by mouth at bedtime.  03/25/20   [provider]  amLODipine (NORVASC) 5 MG tablet Take 1 tablet (5 mg total) by mouth daily. 07/16/20 08/15/20  Donne Hazel, MD  aspirin EC 81 MG tablet Take 81 mg by mouth daily. Swallow whole.    [provider]  diphenhydrAMINE (BENADRYL) 25 MG tablet Take 25 mg by mouth daily as needed for allergies.    [provider]  docusate sodium (COLACE) 100 MG capsule Take 100 mg by mouth 2 (two) times daily as needed for mild constipation.     [provider]  DROPLET PEN NEEDLES  32G X 4 MM MISC  04/19/20   [provider]  ELIQUIS 5 MG TABS tablet TAKE 1 TABLET TWICE DAILY Patient taking differently: Take 5 mg by mouth 2 (two) times daily.  12/30/19   Jerline Pain, MD  esomeprazole (NEXIUM) 40 MG capsule Take 40 mg by mouth in the morning and at bedtime. Morning & afternoon. 04/23/15   [provider]  fluticasone (FLONASE) 50 MCG/ACT nasal spray Place 1 spray into both nostrils daily as needed for allergies.     [provider]  gabapentin (NEURONTIN) 300 MG capsule Take 600 mg by mouth in the morning, at noon, and at bedtime.     [provider]  Insulin Glargine (LANTUS SOLOSTAR) 100 UNIT/ML Solostar Pen Inject 30 Units into the skin at bedtime.     [provider]  Iron-FA-B Cmp-C-Biot-Probiotic (FUSION PLUS) CAPS Take 1 capsule by mouth every evening.  11/19/19   [provider]  metoprolol (LOPRESSOR) 50 MG tablet Take 75-100 mg by mouth See admin instructions. Take 2 tablets (100 mg) by mouth in the morning & take 1.5 tablets (75 mg) by mouth in the evening.    [provider]  Multiple Vitamins-Minerals (PRESERVISION AREDS 2 PO) Take 1 capsule by mouth 2 (two) times daily.     [provider]  NOVOLOG FLEXPEN 100 UNIT/ML FlexPen Inject 0-16 Units into the skin in the morning, at noon, and at bedtime. Sliding scale insulin 09/04/19   [provider]  ondansetron (ZOFRAN) 4 MG tablet Take 4 mg by mouth every 8 (eight) hours as needed for nausea or vomiting.    [provider]  Pitavastatin Calcium (LIVALO) 1 MG TABS Take 1 mg by mouth at bedtime.    [provider]  venlafaxine (EFFEXOR) 37.5 MG tablet Take 37.5 mg by mouth 2 (two) times daily. 10/27/19   [provider]    Allergies    Vasotec, Atorvastatin, Codeine, Cymbalta [duloxetine hcl], Lansoprazole, Morphine and related, Sulfate, Tramadol, Adhesive [tape], and Scopolamine  Review of Systems   Review of Systems  Constitutional: Negative for fever.  Respiratory: Positive for shortness of breath. Negative for cough.   Cardiovascular: Positive for leg swelling. Negative for chest pain.  All other systems reviewed and are negative.   Physical Exam Updated Vital Signs BP (!) 143/65   Pulse 89   Temp 97.6 F (36.4 C) (Oral)   Resp (!) 23   SpO2 100%   Physical Exam Vitals and nursing note reviewed.  Constitutional:      Appearance: She is well-developed. She is not diaphoretic.  HENT:     Head: Normocephalic and atraumatic.     Right Ear: External ear normal.     Left Ear: External ear normal.     Nose: Nose normal.  Eyes:     General:        Right eye: No discharge.        Left eye: No discharge.  Cardiovascular:     Rate and Rhythm: Normal rate and regular rhythm.     Heart sounds: Normal heart sounds.  Pulmonary:     Effort: Tachypnea and accessory muscle usage present.     Breath sounds: Rhonchi present.     Comments: On CPAP Mildly prolonged breath sounds Abdominal:     Palpations: Abdomen is soft.     Tenderness: There is no  abdominal tenderness.  Musculoskeletal:     Comments: Perhaps mild lower extremity edema  Skin:  General: Skin is warm and dry.  Neurological:     Mental Status: She is alert.  Psychiatric:        Mood and Affect: Mood is not anxious.     ED Results / Procedures / Treatments   Labs (all labs ordered are listed, but only abnormal results are displayed) Labs Reviewed  URINALYSIS, ROUTINE W REFLEX MICROSCOPIC - Abnormal; Notable for the following components:      Result Value   Color, Urine STRAW (*)    All other components within normal limits  COMPREHENSIVE METABOLIC PANEL - Abnormal; Notable for the following components:   Glucose, Bld 106 (*)    All other components within normal limits  BRAIN NATRIURETIC PEPTIDE - Abnormal; Notable for the following components:   B Natriuretic Peptide 347.4 (*)    All other components within normal limits  CBC WITH DIFFERENTIAL/PLATELET - Abnormal; Notable for the following components:   WBC 15.6 (*)    RBC 3.39 (*)    Hemoglobin 9.3 (*)    HCT 31.9 (*)    MCHC 29.2 (*)    Platelets 474 (*)    Neutro Abs 13.2 (*)    Monocytes Absolute 1.2 (*)    Abs Immature Granulocytes 0.10 (*)    All other components within normal limits  I-STAT ARTERIAL BLOOD GAS, ED - Abnormal; Notable for the following components:   pCO2 arterial 49.4 (*)    pO2, Arterial 121 (*)    Bicarbonate 30.9 (*)    Acid-Base Excess 5.0 (*)    HCT 29.0 (*)    Hemoglobin 9.9 (*)    All other components within normal limits  RESP PANEL BY RT-PCR (FLU A&B, COVID) ARPGX2  CBG MONITORING, ED  TYPE AND SCREEN  TROPONIN I (HIGH SENSITIVITY)  TROPONIN I (HIGH SENSITIVITY)    EKG None  Radiology DG Chest Portable 1 View  Result Date: 07/22/2020 CLINICAL DATA:  79 year old with acute onset of respiratory distress that began last night. Personal history of LEFT atrial appendage clipping on 07/01/2020. EXAM: PORTABLE CHEST 1 VIEW COMPARISON:  07/12/2020 and earlier.  FINDINGS: Cardiac silhouette UPPER normal in size for AP portable technique, unchanged. Thoracic aorta atherosclerotic, unchanged. Interval development of mild diffuse interstitial pulmonary edema. Moderate-sized LEFT pleural effusion and associated consolidation in the LEFT LOWER LOBE. dorsal cord stimulating device over the mid and lower thoracic spine as noted previously. IMPRESSION: 1. Mild CHF and/or fluid overload. 2. Moderate-sized LEFT pleural effusion and associated passive atelectasis and/or pneumonia involving the LEFT LOWER LOBE. Electronically Signed   By: Evangeline Dakin M.D.   On: 07/22/2020 17:01    Procedures .Critical Care Performed by: Sherwood Gambler, MD Authorized by: Sherwood Gambler, MD   Critical care provider statement:    Critical care time (minutes):  35   Critical care time was exclusive of:  Separately billable procedures and treating other patients   Critical care was necessary to treat or prevent imminent or life-threatening deterioration of the following conditions:  Respiratory failure   Critical care was time spent personally by me on the following activities:  Discussions with consultants, evaluation of patient's response to treatment, examination of patient, ordering and performing treatments and interventions, ordering and review of laboratory studies, ordering and review of radiographic studies, pulse oximetry, re-evaluation of patient's condition, obtaining history from patient or surrogate and review of old charts   (including critical care time)  Medications Ordered in ED Medications  furosemide (LASIX) injection 40 mg (40 mg Intravenous Given  07/22/20 1828)    ED Course  I have reviewed the triage vital signs and the nursing notes.  Pertinent labs & imaging results that were available during my care of the patient were reviewed by me and considered in my medical decision making (see chart for details).    MDM Rules/Calculators/A&P                           Patient presents with respiratory distress.  Work of breathing is improved per EMS though it significantly improved and we switched her from CPAP to BiPAP.  This sounds like CHF based on increased swelling and recurrent history.  Chest x-ray, labs, ECG have been reviewed and incorporated in medical decision making.  Seems like she has extra fluid at this time and will be given Lasix.  On the BiPAP she is now resting comfortably and in no acute distress.  We will leave her on BiPAP for now. There is a pleural effusion but it's not very large and at this time given she's resting comfortably I don't think she needs emergent thoracentesis. If doesn't improve she may need one.  Discussed with Dr. Jonelle Sidle for admission. Final Clinical Impression(s) / ED Diagnoses Final diagnoses:  Acute on chronic respiratory failure, unspecified whether with hypoxia or hypercapnia (HCC)  Acute congestive heart failure, unspecified heart failure type [facility])    Rx / DC Orders ED Discharge Orders    None       Sherwood Gambler, MD 07/22/20 1914    Sherwood Gambler, MD 07/22/20 [phone]

## 2020-07-22 NOTE — H&P (Signed)
History and Physical   [name]:[id number] DOB: 1940-12-24 DOA: 07/22/2020  Referring MD/NP/PA: Dr. Verta Ellen  PCP: Leighton Ruff, MD   Patient coming from: Home  Chief Complaint: Shortness of breath  HPI: [name] is a 79 y.o. female with medical history significant of recent GI bleed status post admission and discharge on November 18, paroxysmal atrial fibrillation, chronic kidney disease stage III osteoarthritis with GERD remote history of asthma, diabetes, hypertension, GERD, recent watchman procedure who was discharged and now back in the hospital with shortness of breath and cough.  Patient was hypoxic on arrival requiring BiPAP.  She denied any change in her medications denied any excessive salt intake denied any change of her diet recently.  Patient appears to have acute exacerbation of CHF.  Currently on the BiPAP and being admitted to the hospital for further evaluation and treatment.  No fever or chills.  Does not appear to have worsening pulmonary edema.  She has history of anemia but hemoglobin is stable above 9 g.  Patient is to resume Eliquis per GI as well as her aspirin..  ED Course: Temperature 97.6 blood pressure 171/74 pulse 110 respirate of 29 oxygen sat 95% on room air.  White count 15.6 hemoglobin 9.9 and platelets 474.  Chemistry is entirely within normal.  ABG showed a pH of 7.404 PCO2 49.5 PO2 of 121.  Urinalysis is negative.  Chest x-ray showed mild CHF and fluid overload.  There is also moderate sized left pleural effusion.  Patient being admitted for CHF as well as left-sided pleural effusion left-sided  Review of Systems: As per HPI otherwise 10 point review of systems negative.    Past Medical History:  Diagnosis Date  . Acute GI bleeding 05/13/2015  . Anxiety   . Arthritis   . Asthma   . Atrial fibrillation ([place]) 10/29/2017  . Bakers cyst, left   . Chronic kidney disease    stage 3  . Constipation   . Diabetes mellitus    type 2   . Dysrhythmia   . GERD (gastroesophageal reflux disease)   . Heart murmur   . History of cellulitis   . History of dizziness   . History of hiatal hernia    tiny 06/15/2016  . Hypertension   . Iron deficiency anemia   . Peripheral vascular disease ([place])   . PONV (postoperative nausea and vomiting)   . Pulmonary nodule     Past Surgical History:  Procedure Laterality Date  . APPENDECTOMY  1952  . BIOPSY  10/16/2018   Procedure: BIOPSY;  Surgeon: Clarene Essex, MD;  Location: [facility];  Service: Endoscopy;;  . BIOPSY  11/26/2019   Procedure: BIOPSY;  Surgeon: Clarene Essex, MD;  Location: [facility];  Service: Endoscopy;;  . BIOPSY  07/14/2020   Procedure: BIOPSY;  Surgeon: Ronald Lobo, MD;  Location: [facility];  Service: Endoscopy;;  . COLONOSCOPY W/ POLYPECTOMY  05/15/2013  . ESOPHAGOGASTRODUODENOSCOPY (EGD) WITH PROPOFOL N/A 06/15/2016   Procedure: ESOPHAGOGASTRODUODENOSCOPY (EGD) WITH PROPOFOL;  Surgeon: Clarene Essex, MD;  Location: [facility];  Service: Endoscopy;  Laterality: N/A;  . ESOPHAGOGASTRODUODENOSCOPY (EGD) WITH PROPOFOL N/A 10/16/2018   Procedure: ESOPHAGOGASTRODUODENOSCOPY (EGD) WITH PROPOFOL;  Surgeon: Clarene Essex, MD;  [facility]: [facility];  Service: Endoscopy;  Laterality: N/A;  . ESOPHAGOGASTRODUODENOSCOPY (EGD) WITH PROPOFOL N/A 11/26/2019   Procedure: ESOPHAGOGASTRODUODENOSCOPY (EGD) WITH PROPOFOL;  Surgeon: Clarene Essex, MD;  [facility]: [facility];  Service: Endoscopy;  Laterality: N/A;  . ESOPHAGOGASTRODUODENOSCOPY (EGD) WITH PROPOFOL N/A 07/14/2020  Procedure: ESOPHAGOGASTRODUODENOSCOPY (EGD) WITH PROPOFOL;  Surgeon: Ronald Lobo, MD;  [facility]: [facility];  Service: Endoscopy;  Laterality: N/A;  . EYE SURGERY Bilateral    cataracts removed  . GI RADIOFREQUENCY ABLATION  10/16/2018   Procedure: GI RADIOFREQUENCY ABLATION;  Surgeon: Clarene Essex, MD;  Location: [facility];  Service: Endoscopy;;  . GI RADIOFREQUENCY ABLATION N/A 11/26/2019    Procedure: GI RADIOFREQUENCY ABLATION;  Surgeon: Clarene Essex, MD;  Location: [facility];  Service: Endoscopy;  Laterality: N/A;  . HAND SURGERY Left 2010   operated on index and ring finger  . HOT HEMOSTASIS N/A 07/14/2020   Procedure: HOT HEMOSTASIS (ARGON PLASMA COAGULATION/BICAP);  Surgeon: Ronald Lobo, MD;  Location: [facility];  Service: Endoscopy;  Laterality: N/A;  . INCONTINENCE SURGERY    . JOINT REPLACEMENT Right    knee  . KNEE ARTHROSCOPY  2003 2005   right knee  . KYPHOPLASTY N/A 01/29/2020   Procedure: T8 KYPHOPLASTY;  Surgeon: Melina Schools, MD;  Location: [facility];  Service: Orthopedics;  Laterality: N/A;  60 mins Local with [facility]  . LEFT ATRIAL APPENDAGE OCCLUSION N/A 07/01/2020   Procedure: LEFT ATRIAL APPENDAGE OCCLUSION;  Surgeon: Vickie Epley, MD;  Location: [facility];  Service: Cardiovascular;  Laterality: N/A;  . mass fallopian tube    . SPINAL CORD STIMULATOR BATTERY EXCHANGE N/A 09/09/2015   Procedure: SPINAL CORD STIMULATOR BATTERY EXCHANGE;  Surgeon: Melina Schools, MD;  Location: [facility];  Service: Orthopedics;  Laterality: N/A;  . TOTAL KNEE ARTHROPLASTY Left 11/05/2017   Procedure: LEFT TOTAL KNEE ARTHROPLASTY;  Surgeon: Paralee Cancel, MD;  Location: [facility];  Service: Orthopedics;  Laterality: Left;  Adductor Block  . VAGINAL HYSTERECTOMY  1977   1 ovary removed     reports that she has never smoked. She has never used smokeless tobacco. She reports that she does not drink alcohol and does not use drugs.  Allergies  Allergen Reactions  . Vasotec Shortness Of Breath and Other (See Comments)    Wheezing, also  . Atorvastatin Other (See Comments)    Leg cramps   . Codeine Nausea And Vomiting  . Cymbalta [Duloxetine Hcl] Itching  . Lansoprazole Itching, Swelling, Rash and Other (See Comments)    Redness, swelling of mouth   . Morphine And Related Itching  . Sulfate Itching  . Tramadol Itching  . Adhesive [Tape] Rash  . Scopolamine  Rash    Family History  Problem Relation Age of Onset  . Lung cancer Mother   . Heart attack Father   . Colon cancer Neg Hx   . Stroke Neg Hx   . Migraines Neg Hx      Prior to Admission medications   Medication Sig Start Date End Date Taking? Authorizing Provider  acetaminophen (TYLENOL) 500 MG tablet Take 1,000 mg by mouth every 8 (eight) hours as needed for mild pain, moderate pain or headache.    Yes [provider]  albuterol (PROAIR HFA) 108 (90 BASE) MCG/ACT inhaler Inhale 2 puffs into the lungs every 4 (four) hours as needed for wheezing or shortness of breath. 02/02/15  Yes Collene Gobble, MD  ALPRAZolam Duanne Moron) 0.25 MG tablet Take 0.25 mg by mouth at bedtime.  03/25/20  Yes [provider]  amLODipine (NORVASC) 5 MG tablet Take 1 tablet (5 mg total) by mouth daily. 07/16/20 08/15/20 Yes Donne Hazel, MD  aspirin EC 81 MG tablet Take 81 mg by mouth daily. Swallow whole.   Yes  [provider]  Continuous Blood Gluc Sensor (FREESTYLE LIBRE 14 DAY SENSOR) MISC Inject 1 patch into the skin every 14 (fourteen) days.   Yes [provider]  diphenhydrAMINE (BENADRYL) 25 MG tablet Take 25 mg by mouth daily as needed for allergies.   Yes [provider]  docusate sodium (COLACE) 100 MG capsule Take 100 mg by mouth 2 (two) times daily as needed for mild constipation.    Yes [provider]  ELIQUIS 5 MG TABS tablet TAKE 1 TABLET TWICE DAILY Patient taking differently: Take 5 mg by mouth 2 (two) times daily.  12/30/19  Yes Jerline Pain, MD  esomeprazole (NEXIUM) 40 MG capsule Take 40 mg by mouth in the morning and at bedtime.  04/23/15  Yes [provider]  fluticasone (FLONASE) 50 MCG/ACT nasal spray Place 1 spray into both nostrils daily as needed for allergies.    Yes [provider]  gabapentin (NEURONTIN) 300 MG capsule Take 600 mg by mouth in the morning, at noon, and at bedtime.    Yes [provider]   Insulin Glargine (LANTUS SOLOSTAR) 100 UNIT/ML Solostar Pen Inject 30 Units into the skin at bedtime.    Yes [provider]  metoprolol (LOPRESSOR) 50 MG tablet Take 75-100 mg by mouth See admin instructions. Take 100 mg by mouth in the morning and 75 mg with supper   Yes [provider]  Multiple Vitamins-Minerals (PRESERVISION AREDS 2 PO) Take 1 capsule by mouth 2 (two) times daily.    Yes [provider]  NOVOLOG FLEXPEN 100 UNIT/ML FlexPen Inject 0-16 Units into the skin See admin instructions. Inject 0-16 units into the skin, PER SLIDING SCALE, in the morning, noon, and bedtime 09/04/19  Yes [provider]  ondansetron (ZOFRAN) 4 MG tablet Take 4 mg by mouth every 8 (eight) hours as needed for nausea or vomiting.   Yes [provider]  OXYGEN Inhale 2.5 L/min into the lungs as needed (for shortness of breath).   Yes [provider]  Pitavastatin Calcium (LIVALO) 1 MG TABS Take 1 mg by mouth at bedtime.   Yes [provider]  venlafaxine (EFFEXOR) 37.5 MG tablet Take 37.5 mg by mouth 2 (two) times daily. 10/27/19  Yes [provider]  DROPLET PEN NEEDLES 32G X 4 MM MISC  04/19/20   [provider]  Iron-FA-B Cmp-C-Biot-Probiotic (FUSION PLUS) CAPS Take 1 capsule by mouth every evening.  11/19/19   [provider]    Physical Exam: Vitals:   07/22/20 1930 07/22/20 2000 07/22/20 2030 07/22/20 2100  BP: (!) 151/60 (!) 155/59 (!) 152/61 (!) 159/60  Pulse: 93 (!) 110 92 (!) 101  Resp: 18 20 20  (!) 21  Temp:      TempSrc:      SpO2: 100% 100% 100% 100%      Constitutional: Acutely ill looking, no distress, on BiPAP Vitals:   07/22/20 1930 07/22/20 2000 07/22/20 2030 07/22/20 2100  BP: (!) 151/60 (!) 155/59 (!) 152/61 (!) 159/60  Pulse: 93 (!) 110 92 (!) 101  Resp: 18 20 20  (!) 21  Temp:      TempSrc:      SpO2: 100% 100% 100% 100%   Eyes: PERRL, lids and conjunctivae normal ENMT: Mucous membranes  are moist. Posterior pharynx clear of any exudate or lesions.Normal dentition.  Neck: normal, supple, no masses, no thyromegaly Respiratory: Decreased air entry bilaterally with marked crackles and increased respiratory effort.. No accessory muscle use.  Cardiovascular: Sinus tachycardia, no murmurs / rubs / gallops. No extremity edema. 2+ pedal pulses. No carotid bruits.  Abdomen: no tenderness, no masses palpated. No hepatosplenomegaly. Bowel sounds positive.  Musculoskeletal: no clubbing / cyanosis. No joint deformity upper and lower extremities. Good ROM, no contractures. Normal muscle tone.  Skin: no rashes, lesions, ulcers. No induration Neurologic: CN 2-12 grossly intact. Sensation intact, DTR normal. Strength 5/5 in all 4.  Psychiatric: On BiPAP, appears awake alert and oriented x3.     Labs on Admission: I have personally reviewed following labs and imaging studies  CBC: Recent Labs  Lab 07/22/20 1624 07/22/20 1728  WBC 15.6*  --   NEUTROABS 13.2*  --   HGB 9.3* 9.9*  HCT 31.9* 29.0*  MCV 94.1  --   PLT 474*  --    Basic Metabolic Panel: Recent Labs  Lab 07/22/20 1624 07/22/20 1728  NA 137 139  K 4.1 3.9  CL 99  --   CO2 26  --   GLUCOSE 106*  --   BUN 16  --   CREATININE 0.95  --   CALCIUM 9.2  --    GFR: Estimated Creatinine Clearance: 46.7 mL/min (by C-G formula based on SCr of 0.95 mg/dL). Liver Function Tests: Recent Labs  Lab 07/22/20 1624  AST 19  ALT 21  ALKPHOS 86  BILITOT 0.7  PROT 6.8  ALBUMIN 3.6   No results for input(s): LIPASE, AMYLASE in the last 168 hours. No results for input(s): AMMONIA in the last 168 hours. Coagulation Profile: No results for input(s): INR, PROTIME in the last 168 hours. Cardiac Enzymes: No results for input(s): CKTOTAL, CKMB, CKMBINDEX, TROPONINI in the last 168 hours. BNP (last 3 results) No results for input(s): PROBNP in the last 8760 hours. HbA1C: No results for input(s): HGBA1C in the last 72  hours. CBG: Recent Labs  Lab 07/22/20 1634  GLUCAP 99   Lipid Profile: No results for input(s): CHOL, HDL, LDLCALC, TRIG, CHOLHDL, LDLDIRECT in the last 72 hours. Thyroid Function Tests: No results for input(s): TSH, T4TOTAL, FREET4, T3FREE, THYROIDAB in the last 72 hours. Anemia Panel: No results for input(s): VITAMINB12, FOLATE, FERRITIN, TIBC, IRON, RETICCTPCT in the last 72 hours. Urine analysis:    Component Value Date/Time   COLORURINE STRAW (A) 07/22/2020 1859   APPEARANCEUR CLEAR 07/22/2020 1859   LABSPEC 1.011 07/22/2020 1859   PHURINE 6.0 07/22/2020 1859   GLUCOSEU NEGATIVE 07/22/2020 1859   HGBUR NEGATIVE 07/22/2020 1859   BILIRUBINUR NEGATIVE 07/22/2020 1859   KETONESUR NEGATIVE 07/22/2020 1859   PROTEINUR NEGATIVE 07/22/2020 1859   UROBILINOGEN 0.2 03/20/2011 2257   NITRITE NEGATIVE 07/22/2020 1859   LEUKOCYTESUR NEGATIVE 07/22/2020 1859   Sepsis Labs: @LABRCNTIP (procalcitonin:4,lacticidven:4) ) Recent Results (from the past 240 hour(s))  Resp Panel by RT-PCR (Flu A&B, Covid) Nasopharyngeal Swab     Status: None   Collection Time: 07/22/20  4:24 PM   Specimen: Nasopharyngeal Swab; Nasopharyngeal(NP) swabs in vial transport medium  Result Value Ref Range Status   SARS Coronavirus 2 by RT PCR NEGATIVE NEGATIVE Final    Comment: (NOTE) SARS-CoV-2 target nucleic acids are NOT DETECTED.  The SARS-CoV-2 RNA is generally detectable in upper respiratory specimens during the acute phase of infection. The lowest concentration of SARS-CoV-2 viral copies this assay can detect is 138 copies/mL. A negative result does not preclude SARS-Cov-2 infection and should not be used as the sole basis for treatment or other patient management decisions. A negative result may occur with  improper specimen collection/handling, submission of specimen other than nasopharyngeal swab, presence of viral mutation(s) within the areas targeted by this assay, and inadequate number of  viral copies(<138 copies/mL). A negative result must be combined with clinical observations, patient history, and epidemiological information. The expected result is Negative.  Fact Sheet for Patients:  EntrepreneurPulse.com.au  Fact Sheet for Healthcare Providers:  IncredibleEmployment.be  This test is no t yet approved or cleared by the [place] FDA and  has been authorized for detection and/or diagnosis of SARS-CoV-2 by FDA under an Emergency Use Authorization (EUA). This EUA will remain  in effect (meaning this test can be used) for the duration of the COVID-19 declaration under Section 564(b)(1) of the Act, 21 U.S.C.section 360bbb-3(b)(1), unless the authorization is terminated  or revoked sooner.       Influenza A by PCR NEGATIVE NEGATIVE Final   Influenza B by PCR NEGATIVE NEGATIVE Final    Comment: (NOTE) The Xpert Xpress SARS-CoV-2/FLU/RSV plus assay is intended as an aid in the diagnosis of influenza from Nasopharyngeal swab specimens and should not be used as a sole basis for treatment. Nasal washings and aspirates are unacceptable for Xpert Xpress SARS-CoV-2/FLU/RSV testing.  Fact Sheet for Patients: EntrepreneurPulse.com.au  Fact Sheet for Healthcare Providers: IncredibleEmployment.be  This test is not yet approved or cleared by the [place] FDA and has been authorized for detection and/or diagnosis of SARS-CoV-2 by FDA under an Emergency Use Authorization (EUA). This EUA will remain in effect (meaning this test can be used) for the duration of the COVID-19 declaration under Section 564(b)(1) of the Act, 21 U.S.C. section 360bbb-3(b)(1), unless the authorization is terminated or revoked.  Performed at [facility], [place] [address] [place], [place] [address]      Radiological Exams on Admission: DG Chest Portable 1 View  Result Date: 07/22/2020 CLINICAL DATA:   79 year old with acute onset of respiratory distress that began last night. Personal history of LEFT atrial appendage clipping on 07/01/2020. EXAM: PORTABLE CHEST 1 VIEW COMPARISON:  07/12/2020 and earlier. FINDINGS: Cardiac silhouette UPPER normal in size for AP portable technique, unchanged. Thoracic aorta atherosclerotic, unchanged. Interval development of mild diffuse interstitial pulmonary edema. Moderate-sized LEFT pleural effusion and associated consolidation in the LEFT LOWER LOBE. dorsal cord stimulating device over the mid and lower thoracic spine as noted previously. IMPRESSION: 1. Mild CHF and/or fluid overload. 2. Moderate-sized LEFT pleural effusion and associated passive atelectasis and/or pneumonia involving the LEFT LOWER LOBE. Electronically Signed   By: Evangeline Dakin M.D.   On: 07/22/2020 17:01    EKG: Independently reviewed.  It shows normal sinus rhythm with a rate of 90, borderline prolonged QT interval no significant changes.   Assessment/Plan Principal Problem:   Acute on chronic respiratory failure with hypoxemia (HCC) Active Problems:   HTN (hypertension)   Diabetes mellitus (HCC)   Hypercholesterolemia   Paroxysmal atrial fibrillation (HCC)   Pleural effusion     #1  Acute hypoxic respiratory failure: Most likely due to CHF and pleural effusion.  Patient will be admitted.  She is currently on BiPAP but will attempt to titrate off the BiPAP.  We will also attempt to get pleural effusion decrease with diuretics.  If that fails we may have to consider thoracentesis.  #2  CHF: Echocardiogram earlier this month showed normal EF of 55 to 60%.  Most likely diastolic dysfunction.  We will diurese.  Consider cardiology consultation.  #3 left-sided pleural effusion: As per #1 above.  #4 paroxysmal  atrial fibrillation: Currently in sinus rhythm.  Status post watchman procedure secondary to recurrent bleeds.  Patient has been cleared to resume anticoagulation.  Continue to  monitor.  #5 diabetes: Continue sliding scale insulin.  #6 hyperlipidemia: Continue with statin.  #7 essential hypertension: Continue home regimen.   DVT prophylaxis: Eliquis Code Status: Full code Family Communication: Husband at bedside Disposition Plan: Home Consults called: None Admission status: Inpatient  Severity of Illness: The appropriate patient status for this patient is INPATIENT. Inpatient status is judged to be reasonable and necessary in order to provide the required intensity of service to ensure the patient's safety. The patient's presenting symptoms, physical exam findings, and initial radiographic and laboratory data in the context of their chronic comorbidities is felt to place them at high risk for further clinical deterioration. Furthermore, it is not anticipated that the patient will be medically stable for discharge from the hospital within 2 midnights of admission. The following factors support the patient status of inpatient.   " The patient's presenting symptoms include shortness of breath. " The worrisome physical exam findings include bilateral crackles left more than right. " The initial radiographic and laboratory data are worrisome because of CHF with left pleural effusion. " The chronic co-morbidities include atrial fibrillation and hypertension.   * I certify that at the point of admission it is my clinical judgment that the patient will require inpatient hospital care spanning beyond 2 midnights from the point of admission due to high intensity of service, high risk for further deterioration and high frequency of surveillance required.Barbette Merino MD [facility] Pager [phone]  If 7PM-7AM, please contact night-coverage [email or web address] Password [facility]  07/22/2020, 9:41 PM

## 2020-07-23 DIAGNOSIS — Z8719 Personal history of other diseases of the digestive system: Secondary | ICD-10-CM

## 2020-07-23 DIAGNOSIS — I480 Paroxysmal atrial fibrillation: Secondary | ICD-10-CM

## 2020-07-23 DIAGNOSIS — I4891 Unspecified atrial fibrillation: Secondary | ICD-10-CM

## 2020-07-23 DIAGNOSIS — I10 Essential (primary) hypertension: Secondary | ICD-10-CM

## 2020-07-23 DIAGNOSIS — J9621 Acute and chronic respiratory failure with hypoxia: Secondary | ICD-10-CM

## 2020-07-23 DIAGNOSIS — J90 Pleural effusion, not elsewhere classified: Secondary | ICD-10-CM

## 2020-07-23 LAB — CBC WITH DIFFERENTIAL/PLATELET
Abs Immature Granulocytes: 0.07 10*3/uL (ref 0.00–0.07)
Basophils Absolute: 0.1 10*3/uL (ref 0.0–0.1)
Basophils Relative: 1 %
Eosinophils Absolute: 0.1 10*3/uL (ref 0.0–0.5)
Eosinophils Relative: 1 %
HCT: 31.8 % — ABNORMAL LOW (ref 36.0–46.0)
Hemoglobin: 9.5 g/dL — ABNORMAL LOW (ref 12.0–15.0)
Immature Granulocytes: 1 %
Lymphocytes Relative: 12 %
Lymphs Abs: 1.3 10*3/uL (ref 0.7–4.0)
MCH: 27.6 pg (ref 26.0–34.0)
MCHC: 29.9 g/dL — ABNORMAL LOW (ref 30.0–36.0)
MCV: 92.4 fL (ref 80.0–100.0)
Monocytes Absolute: 1.1 10*3/uL — ABNORMAL HIGH (ref 0.1–1.0)
Monocytes Relative: 10 %
Neutro Abs: 8.8 10*3/uL — ABNORMAL HIGH (ref 1.7–7.7)
Neutrophils Relative %: 75 %
Platelets: 391 10*3/uL (ref 150–400)
RBC: 3.44 MIL/uL — ABNORMAL LOW (ref 3.87–5.11)
RDW: 14.8 % (ref 11.5–15.5)
WBC: 11.5 10*3/uL — ABNORMAL HIGH (ref 4.0–10.5)
nRBC: 0 % (ref 0.0–0.2)

## 2020-07-23 LAB — BASIC METABOLIC PANEL
Anion gap: 14 (ref 5–15)
BUN: 13 mg/dL (ref 8–23)
CO2: 27 mmol/L (ref 22–32)
Calcium: 9.3 mg/dL (ref 8.9–10.3)
Chloride: 96 mmol/L — ABNORMAL LOW (ref 98–111)
Creatinine, Ser: 0.93 mg/dL (ref 0.44–1.00)
GFR, Estimated: >60 mL/min (ref >60)
Glucose, Bld: 213 mg/dL — ABNORMAL HIGH (ref 70–99)
Potassium: 4 mmol/L (ref 3.5–5.1)
Sodium: 137 mmol/L (ref 135–145)

## 2020-07-23 LAB — GLUCOSE, CAPILLARY
Glucose-Capillary: 237 mg/dL — ABNORMAL HIGH (ref 70–99)
Glucose-Capillary: 295 mg/dL — ABNORMAL HIGH (ref 70–99)
Glucose-Capillary: 218 mg/dL — ABNORMAL HIGH (ref 70–99)
Glucose-Capillary: 261 mg/dL — ABNORMAL HIGH (ref 70–99)

## 2020-07-23 LAB — TSH: TSH: 0.549 u[IU]/mL (ref 0.350–4.500)

## 2020-07-23 MED ORDER — DILTIAZEM HCL-DEXTROSE 125-5 MG/125ML-% IV SOLN (PREMIX)
5.0000 mg/h | INTRAVENOUS | Status: DC
  Administered 2020-07-23: 7.5 mg/h via INTRAVENOUS
  Administered 2020-07-23: 15 mg/h via INTRAVENOUS
  Administered 2020-07-23 – 2020-07-25 (×2): 5 mg/h via INTRAVENOUS
  Filled 2020-07-23 (×4): qty 125

## 2020-07-23 MED ORDER — DIGOXIN 0.25 MG/ML IJ SOLN
0.2500 mg | Freq: Once | INTRAMUSCULAR | Status: AC
  Administered 2020-07-23: 0.25 mg via INTRAVENOUS
  Filled 2020-07-23: qty 2

## 2020-07-23 MED ORDER — DIGOXIN 125 MCG PO TABS
0.1250 mg | ORAL_TABLET | Freq: Every day | ORAL | Status: DC
  Administered 2020-07-24 – 2020-07-26 (×3): 0.125 mg via ORAL
  Filled 2020-07-23 (×3): qty 1

## 2020-07-23 MED ORDER — DIGOXIN 250 MCG PO TABS
0.2500 mg | ORAL_TABLET | Freq: Once | ORAL | Status: AC
  Administered 2020-07-23: 0.25 mg via ORAL
  Filled 2020-07-23: qty 1

## 2020-07-23 MED ORDER — INSULIN ASPART 100 UNIT/ML ~~LOC~~ SOLN
5.0000 [IU] | Freq: Once | SUBCUTANEOUS | Status: AC
  Administered 2020-07-23: 5 [IU] via SUBCUTANEOUS

## 2020-07-23 MED ORDER — DILTIAZEM LOAD VIA INFUSION
10.0000 mg | Freq: Once | INTRAVENOUS | Status: AC
  Administered 2020-07-23: 10 mg via INTRAVENOUS
  Filled 2020-07-23: qty 10

## 2020-07-23 MED ORDER — FUROSEMIDE 10 MG/ML IJ SOLN
40.0000 mg | Freq: Two times a day (BID) | INTRAMUSCULAR | Status: DC
  Administered 2020-07-23 – 2020-07-24 (×2): 40 mg via INTRAVENOUS
  Filled 2020-07-23 (×2): qty 4

## 2020-07-23 MED ORDER — ALPRAZOLAM 0.25 MG PO TABS
0.2500 mg | ORAL_TABLET | Freq: Every day | ORAL | Status: DC
  Administered 2020-07-23 – 2020-07-26 (×5): 0.25 mg via ORAL
  Filled 2020-07-23 (×5): qty 1

## 2020-07-23 MED ORDER — METOPROLOL TARTRATE 50 MG PO TABS
50.0000 mg | ORAL_TABLET | Freq: Two times a day (BID) | ORAL | Status: DC
  Administered 2020-07-23 – 2020-07-24 (×4): 50 mg via ORAL
  Filled 2020-07-23 (×4): qty 1

## 2020-07-23 MED ORDER — INSULIN ASPART 100 UNIT/ML ~~LOC~~ SOLN
0.0000 [IU] | Freq: Three times a day (TID) | SUBCUTANEOUS | Status: DC
  Administered 2020-07-23: 5 [IU] via SUBCUTANEOUS
  Administered 2020-07-23: 3 [IU] via SUBCUTANEOUS
  Administered 2020-07-24: 7 [IU] via SUBCUTANEOUS
  Administered 2020-07-24: 2 [IU] via SUBCUTANEOUS
  Administered 2020-07-24: 3 [IU] via SUBCUTANEOUS
  Administered 2020-07-25: 5 [IU] via SUBCUTANEOUS
  Administered 2020-07-25: 2 [IU] via SUBCUTANEOUS
  Administered 2020-07-25: 5 [IU] via SUBCUTANEOUS
  Administered 2020-07-26: 2 [IU] via SUBCUTANEOUS
  Administered 2020-07-26: 1 [IU] via SUBCUTANEOUS
  Administered 2020-07-26: 7 [IU] via SUBCUTANEOUS
  Administered 2020-07-27: 2 [IU] via SUBCUTANEOUS

## 2020-07-23 MED ORDER — INSULIN GLARGINE 100 UNIT/ML ~~LOC~~ SOLN
12.0000 [IU] | Freq: Every day | SUBCUTANEOUS | Status: DC
  Administered 2020-07-23 – 2020-07-24 (×2): 12 [IU] via SUBCUTANEOUS
  Filled 2020-07-23 (×2): qty 0.12

## 2020-07-23 NOTE — Progress Notes (Signed)
PROGRESS NOTE    [name]  YKD:[id number] DOB: 09/04/1940 DOA: 07/22/2020 PCP: Leighton Ruff, MD   Brief Narrative: 79 year old with past medical history significant for recent GI bleed status post admission and discharge November 18, paroxysmal A. fib, chronic kidney disease a stage III, GERD remote history of asthma, diabetes recent watchman procedure who was discharged and now presents with worsening shortness of breath.  Patient was hypoxic on arrival requiring BiPAP.  Patient has been admitted for acute heart failure exacerbation.  Evaluation in the ED AVH pH 7.4 PCO2 49 PO2 121, chest x-ray showed mild CHF, fluid overload and moderate size left pleural effusion.  White count 15.    Assessment & Plan:   Principal Problem:   Acute on chronic respiratory failure with hypoxemia (HCC) Active Problems:   HTN (hypertension)   Diabetes mellitus (HCC)   Hypercholesterolemia   Paroxysmal atrial fibrillation (HCC)   Pleural effusion   1-Acute Hypoxic Respiratory failure patient required BiPAP on admission. Likely secondary to Acute  diastolic heart failure exacerbation and left pleural effusion. Off BIPAP this am on 3 L oxygen.  Continue with Diuretics.    2-Acute diastolic heart failure exacerbation, left side pleural effusion: --1.8 L -Weight pending  -Continue with IV Lasix 60 mg IV twice daily. -Repeat chest x ray tomorrow to evaluate pleural effusion.  -incentive spirometry.   3-Paroxysmal A. fib, RVR: Status post watchman procedure secondary to recurrent bleeds. Patient was restarted on anticoagulation, last admission.. On Cardizem gtt, HR still 120.  Cardiology consulted.   Diabetes type 2: Continue with a sliding scale insulin  Hyperlipidemia: Continue with the statins  Essential hypertension: Continue with lasix , cardizem./   Recent history of GI bleed Denies bleeding. Monitor hb.   Estimated body mass index is 24.65 kg/m as calculated from the  following:   Height as of 07/12/20: 5\' 7"  (1.702 m).   Weight as of 07/14/20: 71.4 kg.   DVT prophylaxis: Eliquis Code Status: Full code Family Communication: Care discussed with patient.  Disposition Plan:  Status is: Inpatient  Remains inpatient appropriate because:Hemodynamically unstable   Dispo: The patient is from: Home              Anticipated d/c is to: Home              Anticipated d/c date is: 3 days              Patient currently is not medically stable to d/c.        Consultants:   Cardiology   Procedures:   none  Antimicrobials:    Subjective: She report breathing better. Off BIPAP since last night, currently on 3 L   Objective: Vitals:   07/22/20 2212 07/23/20 0027 07/23/20 0115 07/23/20 0422  BP:  127/66 131/76 134/65  Pulse:  (!) 139 (!) 103 95  Resp:  20 18 (!) 21  Temp: 99.2 F (37.3 C) 98.5 F (36.9 C) 98.9 F (37.2 C) 98.4 F (36.9 C)  TempSrc: Axillary Oral Oral Oral  SpO2:  100% 99% 99%    Intake/Output Summary (Last 24 hours) at 07/23/2020 0705 Last data filed at 07/23/2020 0423 Gross per 24 hour  Intake --  Output 1800 ml  Net -1800 ml   There were no vitals filed for this visit.  Examination:  General exam: Appears calm and comfortable  Respiratory system: Tachypnea, BL crackles.  Cardiovascular system: S1 & S2 heard, RRR. No JVD, murmurs, rubs, gallops or clicks. No  pedal edema. Gastrointestinal system: Abdomen is nondistended, soft and nontender. No organomegaly or masses felt. Normal bowel sounds heard. Central nervous system: Alert and oriented. No focal neurological deficits. Extremities: Symmetric 5 x 5 power.   Data Reviewed: I have personally reviewed following labs and imaging studies  CBC: Recent Labs  Lab 07/22/20 1624 07/22/20 1728 07/23/20 0201  WBC 15.6*  --  11.5*  NEUTROABS 13.2*  --  8.8*  HGB 9.3* 9.9* 9.5*  HCT 31.9* 29.0* 31.8*  MCV 94.1  --  92.4  PLT 474*  --  585   Basic Metabolic  Panel: Recent Labs  Lab 07/22/20 1624 07/22/20 1728 07/23/20 0201  NA 137 139 137  K 4.1 3.9 4.0  CL 99  --  96*  CO2 26  --  27  GLUCOSE 106*  --  213*  BUN 16  --  13  CREATININE 0.95  --  0.93  CALCIUM 9.2  --  9.3   GFR: Estimated Creatinine Clearance: 47.7 mL/min (by C-G formula based on SCr of 0.93 mg/dL). Liver Function Tests: Recent Labs  Lab 07/22/20 1624  AST 19  ALT 21  ALKPHOS 86  BILITOT 0.7  PROT 6.8  ALBUMIN 3.6   No results for input(s): LIPASE, AMYLASE in the last 168 hours. No results for input(s): AMMONIA in the last 168 hours. Coagulation Profile: No results for input(s): INR, PROTIME in the last 168 hours. Cardiac Enzymes: No results for input(s): CKTOTAL, CKMB, CKMBINDEX, TROPONINI in the last 168 hours. BNP (last 3 results) No results for input(s): PROBNP in the last 8760 hours. HbA1C: No results for input(s): HGBA1C in the last 72 hours. CBG: Recent Labs  Lab 07/22/20 1634 07/22/20 2240  GLUCAP 99 85   Lipid Profile: No results for input(s): CHOL, HDL, LDLCALC, TRIG, CHOLHDL, LDLDIRECT in the last 72 hours. Thyroid Function Tests: Recent Labs    07/22/20 2225  TSH 0.549   Anemia Panel: No results for input(s): VITAMINB12, FOLATE, FERRITIN, TIBC, IRON, RETICCTPCT in the last 72 hours. Sepsis Labs: No results for input(s): PROCALCITON, LATICACIDVEN in the last 168 hours.  Recent Results (from the past 240 hour(s))  Resp Panel by RT-PCR (Flu A&B, Covid) Nasopharyngeal Swab     Status: None   Collection Time: 07/22/20  4:24 PM   Specimen: Nasopharyngeal Swab; Nasopharyngeal(NP) swabs in vial transport medium  Result Value Ref Range Status   SARS Coronavirus 2 by RT PCR NEGATIVE NEGATIVE Final    Comment: (NOTE) SARS-CoV-2 target nucleic acids are NOT DETECTED.  The SARS-CoV-2 RNA is generally detectable in upper respiratory specimens during the acute phase of infection. The lowest concentration of SARS-CoV-2 viral copies this  assay can detect is 138 copies/mL. A negative result does not preclude SARS-Cov-2 infection and should not be used as the sole basis for treatment or other patient management decisions. A negative result may occur with  improper specimen collection/handling, submission of specimen other than nasopharyngeal swab, presence of viral mutation(s) within the areas targeted by this assay, and inadequate number of viral copies(<138 copies/mL). A negative result must be combined with clinical observations, patient history, and epidemiological information. The expected result is Negative.  Fact Sheet for Patients:  EntrepreneurPulse.com.au  Fact Sheet for Healthcare Providers:  IncredibleEmployment.be  This test is no t yet approved or cleared by the [place] FDA and  has been authorized for detection and/or diagnosis of SARS-CoV-2 by FDA under an Emergency Use Authorization (EUA). This EUA will remain  in  effect (meaning this test can be used) for the duration of the COVID-19 declaration under Section 564(b)(1) of the Act, 21 U.S.C.section 360bbb-3(b)(1), unless the authorization is terminated  or revoked sooner.       Influenza A by PCR NEGATIVE NEGATIVE Final   Influenza B by PCR NEGATIVE NEGATIVE Final    Comment: (NOTE) The Xpert Xpress SARS-CoV-2/FLU/RSV plus assay is intended as an aid in the diagnosis of influenza from Nasopharyngeal swab specimens and should not be used as a sole basis for treatment. Nasal washings and aspirates are unacceptable for Xpert Xpress SARS-CoV-2/FLU/RSV testing.  Fact Sheet for Patients: EntrepreneurPulse.com.au  Fact Sheet for Healthcare Providers: IncredibleEmployment.be  This test is not yet approved or cleared by the [place] FDA and has been authorized for detection and/or diagnosis of SARS-CoV-2 by FDA under an Emergency Use Authorization (EUA). This EUA will  remain in effect (meaning this test can be used) for the duration of the COVID-19 declaration under Section 564(b)(1) of the Act, 21 U.S.C. section 360bbb-3(b)(1), unless the authorization is terminated or revoked.  Performed at [facility], [place] [address], [place], [place] [address]          Radiology Studies: DG Chest Portable 1 View  Result Date: 07/22/2020 CLINICAL DATA:  79 year old with acute onset of respiratory distress that began last night. Personal history of LEFT atrial appendage clipping on 07/01/2020. EXAM: PORTABLE CHEST 1 VIEW COMPARISON:  07/12/2020 and earlier. FINDINGS: Cardiac silhouette UPPER normal in size for AP portable technique, unchanged. Thoracic aorta atherosclerotic, unchanged. Interval development of mild diffuse interstitial pulmonary edema. Moderate-sized LEFT pleural effusion and associated consolidation in the LEFT LOWER LOBE. dorsal cord stimulating device over the mid and lower thoracic spine as noted previously. IMPRESSION: 1. Mild CHF and/or fluid overload. 2. Moderate-sized LEFT pleural effusion and associated passive atelectasis and/or pneumonia involving the LEFT LOWER LOBE. Electronically Signed   By: Evangeline Dakin M.D.   On: 07/22/2020 17:01        Scheduled Meds: . ALPRAZolam  0.25 mg Oral QHS  . apixaban  5 mg Oral BID  . furosemide  60 mg Intravenous BID  . sodium chloride flush  3 mL Intravenous Q12H   Continuous Infusions: . sodium chloride    . diltiazem (CARDIZEM) infusion 15 mg/hr (07/23/20 0330)     LOS: 1 day    Time spent: 35 minutes    Shakara Tweedy A Shyla Gayheart, MD [facility]   If 7PM-7AM, please contact night-coverage [email or web address]  07/23/2020, 7:05 AM

## 2020-07-23 NOTE — Consult Note (Signed)
Electrophysiology Consultation:   Patient ID: [name] MRN: [id number]; DOB: [birth year]  Admit date: 07/22/2020 Date of Consult: 07/23/2020  Primary Care Provider: Leighton Ruff, MD [facility] Cardiologist: Candee Furbish, MD  [facility] Electrophysiologist:  Vickie Epley, MD    Patient Profile:   [name] is a 79 y.o. female with a hx of paroxysmal atrial fibrillation and recurrent GI bleeding who was admitted with volume overload and shortness of breath on November 25.  For her recurrent GI bleeding history of atrial fibrillation, [name] has undergone watchman implantation on November 4 by myself and Dr. Burt Knack.  After the procedure she was discharged on Eliquis and aspirin.  She was readmitted on July 10, 2020 due to a recurrent GI bleed requiring transfusion.  She underwent endoscopy with argon laser treatment to an area of vascular ectasia.  She was cleared to resume Eliquis.  She tells me that she did well for 1 to 2 days after she left the hospital and then slowly started to accumulate fluid in her lower extremities, abdomen, face.  She is not on a standing diuretic.  Eventually, the patient experienced significant dyspnea triggering her presentation to the hospital.  When she arrived, chest x-ray shows left-sided pleural effusion, elevated BNP and atrial fibrillation with rapid ventricular response on ECG.  She was started on IV diltiazem for rate control and diuresed with Lasix.  Since being diuresed, [name] tells me she is feeling much better.   Past Medical History:  Diagnosis Date  . Acute GI bleeding 05/13/2015  . Anxiety   . Arthritis   . Asthma   . Atrial fibrillation ([place]) 10/29/2017  . Bakers cyst, left   . Chronic kidney disease    stage 3  . Constipation   . Diabetes mellitus    type 2  . Dysrhythmia   . GERD (gastroesophageal reflux disease)   . Heart murmur   . History of cellulitis   . History of dizziness   .  History of hiatal hernia    tiny 06/15/2016  . Hypertension   . Iron deficiency anemia   . Peripheral vascular disease ([place])   . PONV (postoperative nausea and vomiting)   . Pulmonary nodule     Past Surgical History:  Procedure Laterality Date  . APPENDECTOMY  1952  . BIOPSY  10/16/2018   Procedure: BIOPSY;  Surgeon: Clarene Essex, MD;  Location: [facility];  Service: Endoscopy;;  . BIOPSY  11/26/2019   Procedure: BIOPSY;  Surgeon: Clarene Essex, MD;  Location: [facility];  Service: Endoscopy;;  . BIOPSY  07/14/2020   Procedure: BIOPSY;  Surgeon: Ronald Lobo, MD;  Location: [facility];  Service: Endoscopy;;  . COLONOSCOPY W/ POLYPECTOMY  05/15/2013  . ESOPHAGOGASTRODUODENOSCOPY (EGD) WITH PROPOFOL N/A 06/15/2016   Procedure: ESOPHAGOGASTRODUODENOSCOPY (EGD) WITH PROPOFOL;  Surgeon: Clarene Essex, MD;  Location: [facility];  Service: Endoscopy;  Laterality: N/A;  . ESOPHAGOGASTRODUODENOSCOPY (EGD) WITH PROPOFOL N/A 10/16/2018   Procedure: ESOPHAGOGASTRODUODENOSCOPY (EGD) WITH PROPOFOL;  Surgeon: Clarene Essex, MD;  [facility];  Service: Endoscopy;  Laterality: N/A;  . ESOPHAGOGASTRODUODENOSCOPY (EGD) WITH PROPOFOL N/A 11/26/2019   Procedure: ESOPHAGOGASTRODUODENOSCOPY (EGD) WITH PROPOFOL;  Surgeon: Clarene Essex, MD;  [facility];  Service: Endoscopy;  Laterality: N/A;  . ESOPHAGOGASTRODUODENOSCOPY (EGD) WITH PROPOFOL N/A 07/14/2020   Procedure: ESOPHAGOGASTRODUODENOSCOPY (EGD) WITH PROPOFOL;  Surgeon: Ronald Lobo, MD;  [facility]: [facility];  Service: Endoscopy;  Laterality: N/A;  . EYE SURGERY Bilateral    cataracts removed  .  GI RADIOFREQUENCY ABLATION  10/16/2018   Procedure: GI RADIOFREQUENCY ABLATION;  Surgeon: Clarene Essex, MD;  Location: [facility];  Service: Endoscopy;;  . GI RADIOFREQUENCY ABLATION N/A 11/26/2019   Procedure: GI RADIOFREQUENCY ABLATION;  Surgeon: Clarene Essex, MD;  [facility]: [facility];  Service: Endoscopy;  Laterality: N/A;  . HAND  SURGERY Left 2010   operated on index and ring finger  . HOT HEMOSTASIS N/A 07/14/2020   Procedure: HOT HEMOSTASIS (ARGON PLASMA COAGULATION/BICAP);  Surgeon: Ronald Lobo, MD;  Location: [facility];  Service: Endoscopy;  Laterality: N/A;  . INCONTINENCE SURGERY    . JOINT REPLACEMENT Right    knee  . KNEE ARTHROSCOPY  2003 2005   right knee  . KYPHOPLASTY N/A 01/29/2020   Procedure: T8 KYPHOPLASTY;  Surgeon: Melina Schools, MD;  Location: [facility];  Service: Orthopedics;  Laterality: N/A;  60 mins Local with [facility]  . LEFT ATRIAL APPENDAGE OCCLUSION N/A 07/01/2020   Procedure: LEFT ATRIAL APPENDAGE OCCLUSION;  Surgeon: Vickie Epley, MD;  Location: [facility];  Service: Cardiovascular;  Laterality: N/A;  . mass fallopian tube    . SPINAL CORD STIMULATOR BATTERY EXCHANGE N/A 09/09/2015   Procedure: SPINAL CORD STIMULATOR BATTERY EXCHANGE;  Surgeon: Melina Schools, MD;  Location: [facility];  Service: Orthopedics;  Laterality: N/A;  . TOTAL KNEE ARTHROPLASTY Left 11/05/2017   Procedure: LEFT TOTAL KNEE ARTHROPLASTY;  Surgeon: Paralee Cancel, MD;  Location: [facility];  Service: Orthopedics;  Laterality: Left;  Adductor Block  . VAGINAL HYSTERECTOMY  1977   1 ovary removed     Home Medications:  Prior to Admission medications   Medication Sig Start Date End Date Taking? Authorizing Provider  acetaminophen (TYLENOL) 500 MG tablet Take 1,000 mg by mouth every 8 (eight) hours as needed for mild pain, moderate pain or headache.    Yes [provider]  albuterol (PROAIR HFA) 108 (90 BASE) MCG/ACT inhaler Inhale 2 puffs into the lungs every 4 (four) hours as needed for wheezing or shortness of breath. 02/02/15  Yes Collene Gobble, MD  ALPRAZolam Duanne Moron) 0.25 MG tablet Take 0.25 mg by mouth at bedtime.  03/25/20  Yes [provider]  amLODipine (NORVASC) 5 MG tablet Take 1 tablet (5 mg total) by mouth daily. 07/16/20 08/15/20 Yes Donne Hazel, MD  aspirin EC 81 MG tablet  Take 81 mg by mouth daily. Swallow whole.   Yes [provider]  Continuous Blood Gluc Sensor (FREESTYLE LIBRE 14 DAY SENSOR) MISC Inject 1 patch into the skin every 14 (fourteen) days.   Yes [provider]  diphenhydrAMINE (BENADRYL) 25 MG tablet Take 25 mg by mouth daily as needed for allergies.   Yes [provider]  docusate sodium (COLACE) 100 MG capsule Take 100 mg by mouth 2 (two) times daily as needed for mild constipation.    Yes [provider]  ELIQUIS 5 MG TABS tablet TAKE 1 TABLET TWICE DAILY Patient taking differently: Take 5 mg by mouth 2 (two) times daily.  12/30/19  Yes Jerline Pain, MD  esomeprazole (NEXIUM) 40 MG capsule Take 40 mg by mouth in the morning and at bedtime.  04/23/15  Yes [provider]  fluticasone (FLONASE) 50 MCG/ACT nasal spray Place 1 spray into both nostrils daily as needed for allergies.    Yes [provider]  gabapentin (NEURONTIN) 300 MG capsule Take 600 mg by mouth in the morning, at noon, and at bedtime.    Yes [provider]  Insulin Glargine (LANTUS SOLOSTAR) 100 UNIT/ML Solostar Pen Inject 30 Units into the skin at bedtime.    Yes [provider]  metoprolol (LOPRESSOR) 50 MG tablet Take 75-100 mg by mouth See admin instructions. Take 100 mg by mouth in the morning and 75 mg with supper   Yes [provider]  Multiple Vitamins-Minerals (PRESERVISION AREDS 2 PO) Take 1 capsule by mouth 2 (two) times daily.    Yes [provider]  NOVOLOG FLEXPEN 100 UNIT/ML FlexPen Inject 0-16 Units into the skin See admin instructions. Inject 0-16 units into the skin, PER SLIDING SCALE, in the morning, noon, and bedtime 09/04/19  Yes [provider]  ondansetron (ZOFRAN) 4 MG tablet Take 4 mg by mouth every 8 (eight) hours as needed for nausea or vomiting.   Yes [provider]  OXYGEN Inhale 2.5 L/min into the lungs as needed (for shortness of breath).   Yes  [provider]  Pitavastatin Calcium (LIVALO) 1 MG TABS Take 1 mg by mouth at bedtime.   Yes [provider]  sodium chloride (OCEAN) 0.65 % SOLN nasal spray Place 1 spray into both nostrils as needed for congestion.   Yes [provider]  venlafaxine (EFFEXOR) 37.5 MG tablet Take 37.5 mg by mouth 2 (two) times daily. 10/27/19  Yes [provider]  DROPLET PEN NEEDLES 32G X 4 MM MISC  04/19/20   [provider]  Iron-FA-B Cmp-C-Biot-Probiotic (FUSION PLUS) CAPS Take 1 capsule by mouth every evening.  11/19/19   [provider]    Inpatient Medications: Scheduled Meds: . ALPRAZolam  0.25 mg Oral QHS  . apixaban  5 mg Oral BID  . digoxin  0.25 mg Intravenous Once  . furosemide  40 mg Intravenous BID  . insulin aspart  0-9 Units Subcutaneous TID WC  . insulin glargine  12 Units Subcutaneous Daily  . metoprolol tartrate  50 mg Oral BID  . sodium chloride flush  3 mL Intravenous Q12H   Continuous Infusions: . sodium chloride    . diltiazem (CARDIZEM) infusion 15 mg/hr (07/23/20 1006)   PRN Meds: sodium chloride, acetaminophen, ondansetron (ZOFRAN) IV, sodium chloride flush  Allergies:    Allergies  Allergen Reactions  . Vasotec Shortness Of Breath and Other (See Comments)    Wheezing, also  . Atorvastatin Other (See Comments)    Leg cramps   . Codeine Nausea And Vomiting  . Cymbalta [Duloxetine Hcl] Itching  . Lansoprazole Itching, Swelling, Rash and Other (See Comments)    Redness, swelling of mouth   . Morphine And Related Itching  . Sulfate Itching  . Tramadol Itching  . Adhesive [Tape] Rash  . Scopolamine Rash    Social History:   Social History   Socioeconomic History  . Marital status: Married    Spouse name: Charlotte Crumb  . Number of children: 2  . Years of education: 55  . Highest education level: Not on file  Occupational History    Comment: retired Pharmacist, hospital, IT sales professional  Tobacco Use  . Smoking status: Never  Smoker  . Smokeless tobacco: Never Used  Vaping Use  . Vaping Use: Never used  Substance and Sexual Activity  . Alcohol use: No  . Drug use: No  . Sexual activity: Never    Birth control/protection: Surgical  Other Topics Concern  . Not on file  Social History Narrative   Lives with husband   Caffeine use- none   Social Determinants of Health  Financial Resource Strain:   . Difficulty of Paying Living Expenses: Not on file  Food Insecurity:   . Worried About Charity fundraiser in the Last Year: Not on file  . Ran Out of Food in the Last Year: Not on file  Transportation Needs:   . Lack of Transportation (Medical): Not on file  . Lack of Transportation (Non-Medical): Not on file  Physical Activity:   . Days of Exercise per Week: Not on file  . Minutes of Exercise per Session: Not on file  Stress:   . Feeling of Stress : Not on file  Social Connections:   . Frequency of Communication with Friends and Family: Not on file  . Frequency of Social Gatherings with Friends and Family: Not on file  . Attends Religious Services: Not on file  . Active Member of Clubs or Organizations: Not on file  . Attends Archivist Meetings: Not on file  . Marital Status: Not on file  Intimate Partner Violence:   . Fear of Current or Ex-Partner: Not on file  . Emotionally Abused: Not on file  . Physically Abused: Not on file  . Sexually Abused: Not on file    Family History:    Family History  Problem Relation Age of Onset  . Lung cancer Mother   . Heart attack Father   . Colon cancer Neg Hx   . Stroke Neg Hx   . Migraines Neg Hx      ROS:  Please see the history of present illness.   All other ROS reviewed and negative.     Physical Exam/Data:   Vitals:   07/23/20 0715 07/23/20 0800 07/23/20 1156 07/23/20 1228  BP: 140/74  (!) 159/59   Pulse: (!) 107  (!) 129   Resp: 20  (!) 23   Temp: 98.6 F (37 C)  98.2 F (36.8 C)   TempSrc: Oral  Oral   SpO2:  99%     Height:    5\' 7"  (1.702 m)    Intake/Output Summary (Last 24 hours) at 07/23/2020 1254 Last data filed at 07/23/2020 1219 Gross per 24 hour  Intake --  Output 3400 ml  Net -3400 ml   Last 3 Weights 07/14/2020 07/12/2020 07/01/2020  Weight (lbs) 157 lb 6.5 oz 157 lb 6.5 oz 155 lb  Weight (kg) 71.4 kg 71.4 kg 70.308 kg     Body mass index is 24.65 kg/m.   General:  Well nourished, well developed, in no acute distress lying at 30 degrees HEENT: normal Lymph: no adenopathy Neck: JVD 2-3 cm above the clavicle at 30 degrees Endocrine:  No thryomegaly Vascular: No carotid bruits;   Cardiac: Tachycardic, irregularly irregular.  Warm extremities. Lungs: No increased work of breathing. Abd: soft, nontender, no hepatomegaly  Ext: no edema Musculoskeletal:  No deformities, BUE and BLE strength normal and equal Skin: warm and dry  Neuro:  CNs 2-12 intact, no focal abnormalities noted Psych:  Normal affect   EKG:  The EKG was personally reviewed and demonstrates: Atrial fibrillation with rapid ventricular response Telemetry:  Telemetry was personally reviewed and demonstrates: Atrial fibrillation with rapid ventricular response  Relevant CV Studies: Previous echoes were reviewed  Chest x-ray from admission was personally reviewed and demonstrates a small to moderate left-sided pleural effusion.  July 13, 2020 echo personally reviewed Left ventricular function normal, 55% Right ventricular function mildly reduced Significant dilation of both atria Trivial MR Moderate TR  Laboratory Data:  High Sensitivity  Troponin:   Recent Labs  Lab 07/12/20 1626 07/22/20 1624 07/22/20 1819  TROPONINIHS 4 11 11      Chemistry Recent Labs  Lab 07/22/20 1624 07/22/20 1728 07/23/20 0201  NA 137 139 137  K 4.1 3.9 4.0  CL 99  --  96*  CO2 26  --  27  GLUCOSE 106*  --  213*  BUN 16  --  13  CREATININE 0.95  --  0.93  CALCIUM 9.2  --  9.3  GFRNONAA >60  --  >60  ANIONGAP 12  --   14    Recent Labs  Lab 07/22/20 1624  PROT 6.8  ALBUMIN 3.6  AST 19  ALT 21  ALKPHOS 86  BILITOT 0.7   Hematology Recent Labs  Lab 07/22/20 1624 07/22/20 1728 07/23/20 0201  WBC 15.6*  --  11.5*  RBC 3.39*  --  3.44*  HGB 9.3* 9.9* 9.5*  HCT 31.9* 29.0* 31.8*  MCV 94.1  --  92.4  MCH 27.4  --  27.6  MCHC 29.2*  --  29.9*  RDW 14.7  --  14.8  PLT 474*  --  391   BNP Recent Labs  Lab 07/22/20 1624  BNP 347.4*    DDimer No results for input(s): DDIMER in the last 168 hours.   Radiology/Studies:  DG Chest Portable 1 View  Result Date: 07/22/2020 CLINICAL DATA:  79 year old with acute onset of respiratory distress that began last night. Personal history of LEFT atrial appendage clipping on 07/01/2020. EXAM: PORTABLE CHEST 1 VIEW COMPARISON:  07/12/2020 and earlier. FINDINGS: Cardiac silhouette UPPER normal in size for AP portable technique, unchanged. Thoracic aorta atherosclerotic, unchanged. Interval development of mild diffuse interstitial pulmonary edema. Moderate-sized LEFT pleural effusion and associated consolidation in the LEFT LOWER LOBE. dorsal cord stimulating device over the mid and lower thoracic spine as noted previously. IMPRESSION: 1. Mild CHF and/or fluid overload. 2. Moderate-sized LEFT pleural effusion and associated passive atelectasis and/or pneumonia involving the LEFT LOWER LOBE. Electronically Signed   By: Evangeline Dakin M.D.   On: 07/22/2020 17:01     Assessment and Plan:   1. Atrial fibrillation, paroxysmal Likely is contributing to some of her volume overload symptoms and fluid retention. -Continue digoxin -continue metoprolol, with increased dose to 75 mg twice daily -Okay to continue diltiazem drip for now.   If she goes back into sinus rhythm, would favor short course of amiodarone to help maintain normal rhythm  2.  Volume overload Likely due to volume bolus with blood recently.  Will need a standing diuretic upon discharge.  3.   Anemia due to recurrent GI bleeding Hemoglobin stable on Eliquis Continue aspirin and Eliquis given recent implant of the watchman device.  For questions or updates, please contact [place] Please consult [email or web address] for contact info under    Signed, Vickie Epley, MD  07/23/2020 12:54 PM

## 2020-07-23 NOTE — Progress Notes (Signed)
   07/23/20 1156  Assess: MEWS Score  Temp 98.2 F (36.8 C)  BP (!) 159/59  Pulse Rate (!) 129  ECG Heart Rate (!) 125  Resp (!) 23  Assess: MEWS Score  MEWS Temp 0  MEWS Systolic 0  MEWS Pulse 2  MEWS RR 1  MEWS LOC 0  MEWS Score 3  MEWS Score Color Yellow  Assess: if the MEWS score is Yellow or Red  Were vital signs taken at a resting state? Yes  Focused Assessment No change from prior assessment  Early Detection of Sepsis Score *See Row Information* Low  MEWS guidelines implemented *See Row Information* No, previously yellow, continue vital signs every 4 hours

## 2020-07-23 NOTE — Progress Notes (Signed)
Called by RN who reported patient with a heart rate of 120-140.  Patient is in sinus tachycardia when she arrived to floor earlier.  Reportedly has history of A. fib in the past. EKG obtained and shows A. fib with RVR with heart rate in the 130s Started on Cardizem infusion.  Will monitor

## 2020-07-23 NOTE — Consult Note (Addendum)
Cardiology Consultation:   Patient ID: [name] MRN: [id number]; DOB: Mar 28, 1941  Admit date: 07/22/2020 Date of Consult: 07/23/2020  Primary Care Provider: Leighton Ruff, MD [facility] Cardiologist: Candee Furbish, MD [facility] Electrophysiologist:  Vickie Epley, MD    Patient Profile:   [name] is a 79 y.o. female with a hx of PAF s/p watchman device, hyperlipidemia, DM 2, and recurrent GI bleed despite h/o argon plasma coagulation who is being seen today for the evaluation of  at the request of Dr. Tyrell Antonio.  History of Present Illness:   [name] is a pleasant 79 year old female with past medical history of PAF s/p watchman device, HTN, HLD, DM 2, and recurrent GI bleed despite h/o argon plasma coagulation.  Due to recurrent GI bleed, patient was referred for watchman device implantation which was performed on 07/01/2020 by Dr. Burt Knack with a #27 watchman device.  Postprocedure, repeat limited echocardiogram obtained 07/02/2020 showed EF 55 to 60%, moderate MR.  After the procedure, he was discharged on the following day with plan to continue Eliquis and 81 mg aspirin for 45 days then proceed with outpatient TEE to ensure proper seal of watchman device.  After that, he will continue aspirin and Plavix for a total of 6 months.  He had a repeat limited echocardiogram as outpatient on 07/07/2020 that showed EF 60 to 65%, 27 mm watchman device, residual ASD flow from transseptal puncture.  Unfortunately, patient was readmitted to the hospital in 07/10/2020 due to recurrent GI bleed.  Antiplatelet and anticoagulation medication were held.  She ended up having 2 units of packed red blood cell transfusion.  Patient underwent upper GI endoscopy that revealed moderately severe resolving distal esophagitis, stomach clot blood in the gastric antrum, vascular ectasia that was treated with APC.  Patient was cleared by GI service to resume Eliquis immediately postop, aspirin  was held for 3 more additional days before restarting.  During the hospitalization, patient also had atrial fibrillation with RVR.  Cardiology service was consulted and her home metoprolol was increased to 100 mg a.m. and 75 mg p.m.  Amlodipine was reduced to 5 mg daily.  Patient was eventually discharged on 11/18 with home oxygen.  Since discharge, patient has been noticing worsening shortness of breath, orthopnea and PND.  Patient returned to the hospital on 07/22/2020 due to worsening heart failure symptoms.  Chest x-ray showed moderate sized left pleural effusion with CHF.  BNP was elevated at 347.  Troponin negative.  White blood cell count 15.6.  Hemoglobin stable at 9.3.  Patient was also in A. fib with RVR on arrival.  Home oral metoprolol was held and patient was transitioned to IV diltiazem for better rate control.  She was also started on 60 mg twice daily of IV Lasix.  Cardiology service consulted for CHF and A. fib management.   Past Medical History:  Diagnosis Date  . Acute GI bleeding 05/13/2015  . Anxiety   . Arthritis   . Asthma   . Atrial fibrillation ([place]) 10/29/2017  . Bakers cyst, left   . Chronic kidney disease    stage 3  . Constipation   . Diabetes mellitus    type 2  . Dysrhythmia   . GERD (gastroesophageal reflux disease)   . Heart murmur   . History of cellulitis   . History of dizziness   . History of hiatal hernia    tiny 06/15/2016  . Hypertension   . Iron deficiency anemia   .  Peripheral vascular disease ([place])   . PONV (postoperative nausea and vomiting)   . Pulmonary nodule     Past Surgical History:  Procedure Laterality Date  . APPENDECTOMY  1952  . BIOPSY  10/16/2018   Procedure: BIOPSY;  Surgeon: Clarene Essex, MD;  Location: [facility];  Service: Endoscopy;;  . BIOPSY  11/26/2019   Procedure: BIOPSY;  Surgeon: Clarene Essex, MD;  Location: [facility];  Service: Endoscopy;;  . BIOPSY  07/14/2020   Procedure: BIOPSY;  Surgeon: Ronald Lobo,  MD;  Location: [facility];  Service: Endoscopy;;  . COLONOSCOPY W/ POLYPECTOMY  05/15/2013  . ESOPHAGOGASTRODUODENOSCOPY (EGD) WITH PROPOFOL N/A 06/15/2016   Procedure: ESOPHAGOGASTRODUODENOSCOPY (EGD) WITH PROPOFOL;  Surgeon: Clarene Essex, MD;  [facility]: [facility];  Service: Endoscopy;  Laterality: N/A;  . ESOPHAGOGASTRODUODENOSCOPY (EGD) WITH PROPOFOL N/A 10/16/2018   Procedure: ESOPHAGOGASTRODUODENOSCOPY (EGD) WITH PROPOFOL;  Surgeon: Clarene Essex, MD;  [facility];  Service: Endoscopy;  Laterality: N/A;  . ESOPHAGOGASTRODUODENOSCOPY (EGD) WITH PROPOFOL N/A 11/26/2019   Procedure: ESOPHAGOGASTRODUODENOSCOPY (EGD) WITH PROPOFOL;  Surgeon: Clarene Essex, MD;  [facility];  Service: Endoscopy;  Laterality: N/A;  . ESOPHAGOGASTRODUODENOSCOPY (EGD) WITH PROPOFOL N/A 07/14/2020   Procedure: ESOPHAGOGASTRODUODENOSCOPY (EGD) WITH PROPOFOL;  Surgeon: Ronald Lobo, MD;  [facility]: [facility];  Service: Endoscopy;  Laterality: N/A;  . EYE SURGERY Bilateral    cataracts removed  . GI RADIOFREQUENCY ABLATION  10/16/2018   Procedure: GI RADIOFREQUENCY ABLATION;  Surgeon: Clarene Essex, MD;  Location: [facility];  Service: Endoscopy;;  . GI RADIOFREQUENCY ABLATION N/A 11/26/2019   Procedure: GI RADIOFREQUENCY ABLATION;  Surgeon: Clarene Essex, MD;  Location: [facility];  Service: Endoscopy;  Laterality: N/A;  . HAND SURGERY Left 2010   operated on index and ring finger  . HOT HEMOSTASIS N/A 07/14/2020   Procedure: HOT HEMOSTASIS (ARGON PLASMA COAGULATION/BICAP);  Surgeon: Ronald Lobo, MD;  Location: [facility];  Service: Endoscopy;  Laterality: N/A;  . INCONTINENCE SURGERY    . JOINT REPLACEMENT Right    knee  . KNEE ARTHROSCOPY  2003 2005   right knee  . KYPHOPLASTY N/A 01/29/2020   Procedure: T8 KYPHOPLASTY;  Surgeon: Melina Schools, MD;  Location: [facility];  Service: Orthopedics;  Laterality: N/A;  60 mins Local with [facility]  . LEFT ATRIAL APPENDAGE OCCLUSION N/A  07/01/2020   Procedure: LEFT ATRIAL APPENDAGE OCCLUSION;  Surgeon: Vickie Epley, MD;  Location: [facility];  Service: Cardiovascular;  Laterality: N/A;  . mass fallopian tube    . SPINAL CORD STIMULATOR BATTERY EXCHANGE N/A 09/09/2015   Procedure: SPINAL CORD STIMULATOR BATTERY EXCHANGE;  Surgeon: Melina Schools, MD;  Location: [facility];  Service: Orthopedics;  Laterality: N/A;  . TOTAL KNEE ARTHROPLASTY Left 11/05/2017   Procedure: LEFT TOTAL KNEE ARTHROPLASTY;  Surgeon: Paralee Cancel, MD;  Location: [facility];  Service: Orthopedics;  Laterality: Left;  Adductor Block  . VAGINAL HYSTERECTOMY  1977   1 ovary removed     Home Medications:  Prior to Admission medications   Medication Sig Start Date End Date Taking? Authorizing Provider  acetaminophen (TYLENOL) 500 MG tablet Take 1,000 mg by mouth every 8 (eight) hours as needed for mild pain, moderate pain or headache.    Yes [provider]  albuterol (PROAIR HFA) 108 (90 BASE) MCG/ACT inhaler Inhale 2 puffs into the lungs every 4 (four) hours as needed for wheezing or shortness of breath. 02/02/15  Yes Collene Gobble, MD  ALPRAZolam Duanne Moron) 0.25 MG tablet Take 0.25 mg  by mouth at bedtime.  03/25/20  Yes [provider]  amLODipine (NORVASC) 5 MG tablet Take 1 tablet (5 mg total) by mouth daily. 07/16/20 08/15/20 Yes Donne Hazel, MD  aspirin EC 81 MG tablet Take 81 mg by mouth daily. Swallow whole.   Yes [provider]  Continuous Blood Gluc Sensor (FREESTYLE LIBRE 14 DAY SENSOR) MISC Inject 1 patch into the skin every 14 (fourteen) days.   Yes [provider]  diphenhydrAMINE (BENADRYL) 25 MG tablet Take 25 mg by mouth daily as needed for allergies.   Yes [provider]  docusate sodium (COLACE) 100 MG capsule Take 100 mg by mouth 2 (two) times daily as needed for mild constipation.    Yes [provider]  ELIQUIS 5 MG TABS tablet TAKE 1 TABLET TWICE DAILY Patient taking  differently: Take 5 mg by mouth 2 (two) times daily.  12/30/19  Yes Jerline Pain, MD  esomeprazole (NEXIUM) 40 MG capsule Take 40 mg by mouth in the morning and at bedtime.  04/23/15  Yes [provider]  fluticasone (FLONASE) 50 MCG/ACT nasal spray Place 1 spray into both nostrils daily as needed for allergies.    Yes [provider]  gabapentin (NEURONTIN) 300 MG capsule Take 600 mg by mouth in the morning, at noon, and at bedtime.    Yes [provider]  Insulin Glargine (LANTUS SOLOSTAR) 100 UNIT/ML Solostar Pen Inject 30 Units into the skin at bedtime.    Yes [provider]  metoprolol (LOPRESSOR) 50 MG tablet Take 75-100 mg by mouth See admin instructions. Take 100 mg by mouth in the morning and 75 mg with supper   Yes [provider]  Multiple Vitamins-Minerals (PRESERVISION AREDS 2 PO) Take 1 capsule by mouth 2 (two) times daily.    Yes [provider]  NOVOLOG FLEXPEN 100 UNIT/ML FlexPen Inject 0-16 Units into the skin See admin instructions. Inject 0-16 units into the skin, PER SLIDING SCALE, in the morning, noon, and bedtime 09/04/19  Yes [provider]  ondansetron (ZOFRAN) 4 MG tablet Take 4 mg by mouth every 8 (eight) hours as needed for nausea or vomiting.   Yes [provider]  OXYGEN Inhale 2.5 L/min into the lungs as needed (for shortness of breath).   Yes [provider]  Pitavastatin Calcium (LIVALO) 1 MG TABS Take 1 mg by mouth at bedtime.   Yes [provider]  sodium chloride (OCEAN) 0.65 % SOLN nasal spray Place 1 spray into both nostrils as needed for congestion.   Yes [provider]  venlafaxine (EFFEXOR) 37.5 MG tablet Take 37.5 mg by mouth 2 (two) times daily. 10/27/19  Yes [provider]  DROPLET PEN NEEDLES 32G X 4 MM MISC  04/19/20   [provider]  Iron-FA-B Cmp-C-Biot-Probiotic (FUSION PLUS) CAPS Take 1 capsule by mouth every evening.  11/19/19   [provider]    Inpatient Medications: Scheduled Meds: . ALPRAZolam  0.25 mg Oral QHS  . apixaban  5 mg Oral BID  . furosemide  60 mg Intravenous BID  . insulin aspart  0-9 Units Subcutaneous TID WC  . insulin glargine  12 Units Subcutaneous Daily  . sodium chloride flush  3 mL Intravenous Q12H   Continuous Infusions: . sodium chloride    . diltiazem (CARDIZEM) infusion 15 mg/hr (07/23/20 1006)   PRN Meds: sodium chloride, acetaminophen, ondansetron (ZOFRAN) IV, sodium chloride flush  Allergies:    Allergies  Allergen Reactions  . Vasotec Shortness Of Breath and Other (See Comments)    Wheezing, also  . Atorvastatin Other (See Comments)    Leg cramps   . Codeine Nausea And Vomiting  . Cymbalta [Duloxetine Hcl] Itching  . Lansoprazole Itching, Swelling, Rash and Other (See Comments)    Redness, swelling of mouth   . Morphine And Related Itching  . Sulfate Itching  . Tramadol Itching  . Adhesive [Tape] Rash  . Scopolamine Rash    Social History:   Social History   Socioeconomic History  . Marital status: Married    Spouse name: Charlotte Crumb  . Number of children: 2  . Years of education: 57  . Highest education level: Not on file  Occupational History    Comment: retired Pharmacist, hospital, IT sales professional  Tobacco Use  . Smoking status: Never Smoker  . Smokeless tobacco: Never Used  Vaping Use  . Vaping Use: Never used  Substance and Sexual Activity  . Alcohol use: No  . Drug use: No  . Sexual activity: Never    Birth control/protection: Surgical  Other Topics Concern  . Not on file  Social History Narrative   Lives with husband   Caffeine use- none   Social Determinants of Health   Financial Resource Strain:   . Difficulty of Paying Living Expenses: Not on file  Food Insecurity:   . Worried About Charity fundraiser in the Last Year: Not on file  . Ran Out of Food in the Last Year: Not on file  Transportation Needs:   . Lack of Transportation (Medical): Not on  file  . Lack of Transportation (Non-Medical): Not on file  Physical Activity:   . Days of Exercise per Week: Not on file  . Minutes of Exercise per Session: Not on file  Stress:   . Feeling of Stress : Not on file  Social Connections:   . Frequency of Communication with Friends and Family: Not on file  . Frequency of Social Gatherings with Friends and Family: Not on file  . Attends Religious Services: Not on file  . Active Member of Clubs or Organizations: Not on file  . Attends Archivist Meetings: Not on file  . Marital Status: Not on file  Intimate Partner Violence:   . Fear of Current or Ex-Partner: Not on file  . Emotionally Abused: Not on file  . Physically Abused: Not on file  . Sexually Abused: Not on file    Family History:    Family History  Problem Relation Age of Onset  . Lung cancer Mother   . Heart attack Father   . Colon cancer Neg Hx   . Stroke Neg Hx   . Migraines Neg Hx      ROS:  Please see the history of present illness.   All other ROS reviewed and negative.     Physical Exam/Data:   Vitals:   07/23/20 0115 07/23/20 0422 07/23/20 0715 07/23/20 0800  BP: 131/76 134/65 140/74   Pulse: (!) 103 95 (!) 107   Resp: 18 (!) 21 20   Temp: 98.9 F (37.2 C) 98.4 F (36.9 C) 98.6 F (37 C)   TempSrc: Oral Oral Oral   SpO2: 99% 99%  99%    Intake/Output Summary (Last 24 hours) at 07/23/2020 1113 Last data filed at 07/23/2020 1009 Gross per 24 hour  Intake --  Output 2700 ml  Net -2700 ml   Last 3 Weights 07/14/2020 07/12/2020 07/01/2020  Weight (lbs) 157 lb 6.5 oz 157 lb 6.5 oz 155 lb  Weight (kg) 71.4 kg 71.4 kg 70.308 kg     There is no height or weight on file to calculate BMI.  General:  Well nourished, well developed, in no acute distress HEENT: normal Lymph: no adenopathy Neck: no JVD Endocrine:  No thryomegaly Vascular: No carotid bruits; FA pulses 2+ bilaterally without bruits  Cardiac:  normal S1, S2; irregularly  irregular; no murmur  Lungs: Mildly diminished breath sounds in the bases of the lung Abd: soft, nontender, no hepatomegaly  Ext: no edema Musculoskeletal:  No deformities, BUE and BLE strength normal and equal Skin: warm and dry  Neuro:  CNs 2-12 intact, no focal abnormalities noted Psych:  Normal affect   EKG:  The EKG was personally reviewed and demonstrates: Atrial fibrillation with RVR Telemetry:  Telemetry was personally reviewed and demonstrates: Atrial fibrillation, heart rate uncontrolled in the 120s range  Relevant CV Studies:  Echo 07/13/2020 1. Left ventricular ejection fraction, by estimation, is 55 to 60%. The  left ventricle has normal function. The left ventricle has no regional  wall motion abnormalities. There is mild asymmetric left ventricular  hypertrophy of the posterior-lateral  segment. Left ventricular diastolic parameters are indeterminate. Elevated  left ventricular end-diastolic pressure.  2. Right ventricular systolic function is mildly reduced. The right  ventricular size is mildly enlarged. There is normal pulmonary artery  systolic pressure. The estimated right ventricular systolic pressure is  78.6 mmHg.  3. Left atrial size was moderately dilated.  4. Right atrial size was severely dilated.  5. The mitral valve is abnormal. Trivial mitral valve regurgitation.  Moderate mitral annular calcification.  6. Tricuspid valve regurgitation is mild to moderate.  7. The aortic valve is grossly normal. Aortic valve regurgitation is not  visualized. No aortic stenosis is present.  8. The inferior vena cava is normal in size with greater than 50%  respiratory variability, suggesting right atrial pressure of 3 mmHg.  9. S/p 27 mm Watchman FLX 07/01/20, which is not well visualized or  assessed on this transthoracic echo.   Laboratory Data:  High Sensitivity Troponin:   Recent Labs  Lab 07/12/20 1626 07/22/20 1624 07/22/20 1819  TROPONINIHS 4 11  11      Chemistry Recent Labs  Lab 07/22/20 1624 07/22/20 1728 07/23/20 0201  NA 137 139 137  K 4.1 3.9 4.0  CL 99  --  96*  CO2 26  --  27  GLUCOSE 106*  --  213*  BUN 16  --  13  CREATININE 0.95  --  0.93  CALCIUM 9.2  --  9.3  GFRNONAA >60  --  >60  ANIONGAP 12  --  14    Recent Labs  Lab 07/22/20 1624  PROT 6.8  ALBUMIN 3.6  AST 19  ALT 21  ALKPHOS 86  BILITOT 0.7   Hematology Recent Labs  Lab 07/22/20 1624 07/22/20 1728 07/23/20 0201  WBC 15.6*  --  11.5*  RBC 3.39*  --  3.44*  HGB 9.3* 9.9* 9.5*  HCT 31.9* 29.0* 31.8*  MCV 94.1  --  92.4  MCH 27.4  --  27.6  MCHC 29.2*  --  29.9*  RDW 14.7  --  14.8  PLT 474*  --  391   BNP Recent Labs  Lab 07/22/20 1624  BNP 347.4*    DDimer No results for input(s): DDIMER in the last 168 hours.   Radiology/Studies:  DG Chest  Portable 1 View  Result Date: 07/22/2020 CLINICAL DATA:  79 year old with acute onset of respiratory distress that began last night. Personal history of LEFT atrial appendage clipping on 07/01/2020. EXAM: PORTABLE CHEST 1 VIEW COMPARISON:  07/12/2020 and earlier. FINDINGS: Cardiac silhouette UPPER normal in size for AP portable technique, unchanged. Thoracic aorta atherosclerotic, unchanged. Interval development of mild diffuse interstitial pulmonary edema. Moderate-sized LEFT pleural effusion and associated consolidation in the LEFT LOWER LOBE. dorsal cord stimulating device over the mid and lower thoracic spine as noted previously. IMPRESSION: 1. Mild CHF and/or fluid overload. 2. Moderate-sized LEFT pleural effusion and associated passive atelectasis and/or pneumonia involving the LEFT LOWER LOBE. Electronically Signed   By: Evangeline Dakin M.D.   On: 07/22/2020 17:01     Assessment and Plan:   1. Persistent atrial fibrillation with RVR  -Had A. fib with RVR in the setting of acute GI bleed during the recent admission.  Home metoprolol was increased to 100 mg a.m. and 75 mg p.m.  -Home  metoprolol was transitioned to IV diltiazem for rate control.  Currently on the maximum dose of IV diltiazem.    We will start to add back oral metoprolol for additional rate control purposes and titrate up to 75 mg twice daily.  -Recently underwent watchman device implantation on 07/01/2020  We will load with digoxin IV 250 mcg followed by 250 mcg p.o. later tonight and then continue with 125 mcg daily  2. Acute diastolic CHF: Likely due to combination of recent blood transfusion and uncontrolled heart rate.  Underwent IV diuresis.  She managed to put out 2.7 L of fluid since yesterday.  No further lower extremity edema.    - Would recommend run the IV diuretic for 1 more day and potentially transition to low-dose diuretic tomorrow  3. Recent watchman device implantation: Original plan was for the patient to continue Eliquis and aspirin for 45 days before TEE to make sure proper seal in mid December.  After TEE, transition to combination of Plavix and aspirin for 6 months.  Unfortunately patient had acute GI bleed shortly after watchman device implantation, Eliquis and aspirin were held for a few days, but has since been restarted.  4. Recurrent GI bleed: Recently underwent upper endoscopy on 07/14/2020 by Dr. Delma Officer, this revealed moderately severe resolving distal esophagitis, semiclotted blood in the gastric antrum, vascular angiectasia treated with APC  5. Hyperlipidemia  6. DM2        New York Heart Association (NYHA) Functional Class NYHA Class IV   CHA2DS2-VASc Score = 6  This indicates a 9.7% annual risk of stroke. The patient's score is based upon: CHF History: 1 HTN History: 1 Diabetes History: 1 Stroke History: 0 Vascular Disease History: 0 Age Score: 2 Gender Score: 1       For questions or updates, please contact [place] Please consult [email or web address] for contact info under    Signed, Almyra Deforest, [place]  07/23/2020 11:13 AM    ATTENDING ATTESTATION  I  have seen, examined and evaluated the patient this PM along with Almyra Deforest, PA-C.  After reviewing all the available data and chart, we discussed the patients laboratory, study & physical findings as well as symptoms in detail. I agree with her findings, examination as well as impression recommendations as per our discussion.    Attending adjustments noted in italics.   Patient with longstanding persistent PAF who has had Watchman device placed in an attempt eventually avoid for anticoagulation given multiple recurrent GI  bleeds.  She has had a relatively rocky the last month or so that led to the [place] being placed.  Shortly after the device was placed, she had another episode of GI bleed.  She now presents in A. fib RVR with acute diastolic heart failure. Was diuresed well with IV Lasix, will back off on the IV today and reduce to p.o. tomorrow.  Was placed on diltiazem for rate control which can be continued IV (probably was not titrated up with boluses intermittently).  We have restarted beta-blocker at 75 mg twice daily.  We will also start low-dose digoxin load.  Per Dr. Quentin Ore, may consider amiodarone for rate/rhythm control.   We will follow   Glenetta Hew, M.D., M.S. Interventional Cardiologist   Pager # [phone] Phone # [phone] [address] [place], [place] [address]

## 2020-07-23 NOTE — Progress Notes (Signed)
   07/22/20 2245  Assess: MEWS Score  BP (!) 141/74  Pulse Rate (!) 139  ECG Heart Rate (!) 127  Resp 20  Level of Consciousness Alert  SpO2 100 %  O2 Device Nasal Cannula  Patient Activity (if Appropriate) In bed  O2 Flow Rate (L/min) 6 L/min  Assess: MEWS Score  MEWS Temp 0  MEWS Systolic 0  MEWS Pulse 2  MEWS RR 0  MEWS LOC 0  MEWS Score 2  MEWS Score Color Yellow  Assess: if the MEWS score is Yellow or Red  Were vital signs taken at a resting state? Yes  Focused Assessment No change from prior assessment  Early Detection of Sepsis Score *See Row Information* Low  MEWS guidelines implemented *See Row Information* Yes  Treat  Pain Scale 0-10  Pain Score 0  Take Vital Signs  Increase Vital Sign Frequency  Yellow: Q 2hr X 2 then Q 4hr X 2, if remains yellow, continue Q 4hrs  Escalate  MEWS: Escalate Yellow: discuss with charge nurse/RN and consider discussing with provider and RRT  Notify: Charge Nurse/RN  Name of Charge Nurse/RN Notified Lauran Romanski  Date Charge Nurse/RN Notified 07/22/20  Time Charge Nurse/RN Notified 2245  Document  Patient Outcome Other (Comment) (pt stable and remains on unit)  Progress note created (see row info) Yes

## 2020-07-24 ENCOUNTER — Inpatient Hospital Stay (HOSPITAL_COMMUNITY): Payer: Medicare PPO

## 2020-07-24 DIAGNOSIS — J9621 Acute and chronic respiratory failure with hypoxia: Secondary | ICD-10-CM | POA: Diagnosis not present

## 2020-07-24 LAB — GLUCOSE, CAPILLARY
Glucose-Capillary: 200 mg/dL — ABNORMAL HIGH (ref 70–99)
Glucose-Capillary: 343 mg/dL — ABNORMAL HIGH (ref 70–99)
Glucose-Capillary: 233 mg/dL — ABNORMAL HIGH (ref 70–99)
Glucose-Capillary: 260 mg/dL — ABNORMAL HIGH (ref 70–99)

## 2020-07-24 LAB — BASIC METABOLIC PANEL
Anion gap: 12 (ref 5–15)
BUN: 12 mg/dL (ref 8–23)
CO2: 35 mmol/L — ABNORMAL HIGH (ref 22–32)
Calcium: 9.4 mg/dL (ref 8.9–10.3)
Chloride: 89 mmol/L — ABNORMAL LOW (ref 98–111)
Creatinine, Ser: 0.83 mg/dL (ref 0.44–1.00)
GFR, Estimated: >60 mL/min (ref >60)
Glucose, Bld: 143 mg/dL — ABNORMAL HIGH (ref 70–99)
Potassium: 3.7 mmol/L (ref 3.5–5.1)
Sodium: 136 mmol/L (ref 135–145)

## 2020-07-24 MED ORDER — IBUPROFEN 600 MG PO TABS
600.0000 mg | ORAL_TABLET | Freq: Once | ORAL | Status: AC
  Administered 2020-07-24: 600 mg via ORAL
  Filled 2020-07-24: qty 1

## 2020-07-24 MED ORDER — ALBUTEROL SULFATE HFA 108 (90 BASE) MCG/ACT IN AERS: 2.0000 | INHALATION_SPRAY | RESPIRATORY_TRACT | Status: DC | PRN

## 2020-07-24 MED ORDER — PROMETHAZINE HCL 25 MG PO TABS
12.5000 mg | ORAL_TABLET | Freq: Four times a day (QID) | ORAL | Status: DC | PRN
  Administered 2020-07-24: 12.5 mg via ORAL
  Filled 2020-07-24 (×2): qty 1

## 2020-07-24 MED ORDER — PROMETHAZINE HCL 25 MG PO TABS
12.5000 mg | ORAL_TABLET | Freq: Once | ORAL | Status: AC
  Administered 2020-07-24: 12.5 mg via ORAL

## 2020-07-24 MED ORDER — DOCUSATE SODIUM 100 MG PO CAPS: 100.0000 mg | ORAL_CAPSULE | Freq: Two times a day (BID) | ORAL | Status: DC | PRN

## 2020-07-24 MED ORDER — PANTOPRAZOLE SODIUM 40 MG PO TBEC
80.0000 mg | DELAYED_RELEASE_TABLET | Freq: Every day | ORAL | Status: DC
  Administered 2020-07-24 – 2020-07-26 (×3): 80 mg via ORAL
  Filled 2020-07-24 (×3): qty 2

## 2020-07-24 MED ORDER — INSULIN GLARGINE 100 UNIT/ML ~~LOC~~ SOLN
5.0000 [IU] | Freq: Once | SUBCUTANEOUS | Status: AC
  Administered 2020-07-24: 5 [IU] via SUBCUTANEOUS
  Filled 2020-07-24: qty 0.05

## 2020-07-24 MED ORDER — FUROSEMIDE 40 MG PO TABS
40.0000 mg | ORAL_TABLET | Freq: Two times a day (BID) | ORAL | Status: DC
  Administered 2020-07-24: 40 mg via ORAL
  Filled 2020-07-24: qty 1

## 2020-07-24 MED ORDER — ALPRAZOLAM 0.25 MG PO TABS: 0.2500 mg | ORAL_TABLET | Freq: Every day | ORAL | Status: DC

## 2020-07-24 MED ORDER — INSULIN GLARGINE 100 UNIT/ML ~~LOC~~ SOLN
20.0000 [IU] | Freq: Every day | SUBCUTANEOUS | Status: DC
  Administered 2020-07-24: 20 [IU] via SUBCUTANEOUS
  Filled 2020-07-24 (×2): qty 0.2

## 2020-07-24 MED ORDER — TAB-A-VITE/IRON PO TABS
1.0000 | ORAL_TABLET | Freq: Every evening | ORAL | Status: DC
  Administered 2020-07-24 – 2020-07-26 (×3): 1 via ORAL
  Filled 2020-07-24 (×4): qty 1

## 2020-07-24 MED ORDER — GABAPENTIN 300 MG PO CAPS
600.0000 mg | ORAL_CAPSULE | Freq: Three times a day (TID) | ORAL | Status: DC
  Administered 2020-07-24 – 2020-07-27 (×10): 600 mg via ORAL
  Filled 2020-07-24 (×10): qty 2

## 2020-07-24 MED ORDER — VENLAFAXINE HCL 37.5 MG PO TABS
37.5000 mg | ORAL_TABLET | Freq: Two times a day (BID) | ORAL | Status: DC
  Administered 2020-07-24 – 2020-07-27 (×7): 37.5 mg via ORAL
  Filled 2020-07-24 (×8): qty 1

## 2020-07-24 NOTE — Plan of Care (Signed)
  Problem: Elimination: Goal: Will not experience complications related to urinary retention Outcome: Progressing   Problem: Education: Goal: Knowledge of General Education information will improve Description: Including pain rating scale, medication(s)/side effects and non-pharmacologic comfort measures Outcome: Progressing   Problem: Health Behavior/Discharge Planning: Goal: Ability to manage health-related needs will improve Outcome: Progressing   Problem: Clinical Measurements: Goal: Ability to maintain clinical measurements within normal limits will improve Outcome: Progressing Goal: Will remain free from infection Outcome: Progressing Goal: Diagnostic test results will improve Outcome: Progressing Goal: Respiratory complications will improve Outcome: Progressing Goal: Cardiovascular complication will be avoided Outcome: Progressing   Problem: Activity: Goal: Risk for activity intolerance will decrease Outcome: Progressing   Problem: Nutrition: Goal: Adequate nutrition will be maintained Outcome: Progressing   Problem: Coping: Goal: Level of anxiety will decrease Outcome: Progressing   Problem: Elimination: Goal: Will not experience complications related to bowel motility Outcome: Progressing Goal: Will not experience complications related to urinary retention Outcome: Progressing   Problem: Pain Managment: Goal: General experience of comfort will improve Outcome: Progressing   Problem: Safety: Goal: Ability to remain free from injury will improve Outcome: Progressing   Problem: Skin Integrity: Goal: Risk for impaired skin integrity will decrease Outcome: Progressing

## 2020-07-24 NOTE — Progress Notes (Signed)
Progress Note  Patient Name: [name] Date of Encounter: 07/24/2020  [facility] Cardiologist: Candee Furbish, MD   Subjective   Feeling nauseous today but breathing has improved.    Inpatient Medications    Scheduled Meds:  ALPRAZolam  0.25 mg Oral QHS   apixaban  5 mg Oral BID   digoxin  0.125 mg Oral Daily   furosemide  40 mg Intravenous BID   gabapentin  600 mg Oral TID   insulin aspart  0-9 Units Subcutaneous TID WC   insulin glargine  12 Units Subcutaneous Daily   metoprolol tartrate  50 mg Oral BID   multivitamins with iron  1 tablet Oral QPM   pantoprazole  80 mg Oral Q1200   sodium chloride flush  3 mL Intravenous Q12H   venlafaxine  37.5 mg Oral BID   Continuous Infusions:  sodium chloride     diltiazem (CARDIZEM) infusion 5 mg/hr (07/24/20 0000)   PRN Meds: sodium chloride, acetaminophen, albuterol, docusate sodium, ondansetron (ZOFRAN) IV, promethazine, sodium chloride flush   Vital Signs    Vitals:   07/23/20 2322 07/24/20 0000 07/24/20 0404 07/24/20 0806  BP: 126/63  (!) 135/57 132/62  Pulse: 81   81  Resp: 20 20  19   Temp: 98.4 F (36.9 C)  98.3 F (36.8 C) 98.9 F (37.2 C)  TempSrc: Oral  Oral Oral  SpO2:    97%  Weight:      Height:        Intake/Output Summary (Last 24 hours) at 07/24/2020 0920 Last data filed at 07/23/2020 2354 Gross per 24 hour  Intake 422.39 ml  Output 2925 ml  Net -2502.61 ml   Last 3 Weights 07/23/2020 07/14/2020 07/12/2020  Weight (lbs) 149 lb 11.1 oz 157 lb 6.5 oz 157 lb 6.5 oz  Weight (kg) 67.9 kg 71.4 kg 71.4 kg      Telemetry    Atrial fibrillation- Personally Reviewed  ECG    None new- Personally Reviewed  Physical Exam   GEN: No acute distress.   Neck:  2 to 3 cm of JVD at 30 degrees Cardiac:  Tachycardic, irregular, no murmurs, rubs, or gallops.  Respiratory: Clear to auscultation bilaterally. GI: Soft, nontender, non-distended  MS: No edema; No deformity. Neuro:   Nonfocal  Psych: Normal affect   Labs    High Sensitivity Troponin:   Recent Labs  Lab 07/12/20 1626 07/22/20 1624 07/22/20 1819  TROPONINIHS 4 11 11       Chemistry Recent Labs  Lab 07/22/20 1624 07/22/20 1624 07/22/20 1728 07/23/20 0201 07/24/20 0412  NA 137   < > 139 137 136  K 4.1   < > 3.9 4.0 3.7  CL 99  --   --  96* 89*  CO2 26  --   --  27 35*  GLUCOSE 106*  --   --  213* 143*  BUN 16  --   --  13 12  CREATININE 0.95  --   --  0.93 0.83  CALCIUM 9.2  --   --  9.3 9.4  PROT 6.8  --   --   --   --   ALBUMIN 3.6  --   --   --   --   AST 19  --   --   --   --   ALT 21  --   --   --   --   ALKPHOS 86  --   --   --   --  BILITOT 0.7  --   --   --   --   GFRNONAA >60  --   --  >60 >60  ANIONGAP 12  --   --  14 12   < > = values in this interval not displayed.     Hematology Recent Labs  Lab 07/22/20 1624 07/22/20 1728 07/23/20 0201  WBC 15.6*  --  11.5*  RBC 3.39*  --  3.44*  HGB 9.3* 9.9* 9.5*  HCT 31.9* 29.0* 31.8*  MCV 94.1  --  92.4  MCH 27.4  --  27.6  MCHC 29.2*  --  29.9*  RDW 14.7  --  14.8  PLT 474*  --  391    BNP Recent Labs  Lab 07/22/20 1624  BNP 347.4*     DDimer No results for input(s): DDIMER in the last 168 hours.   Radiology    DG Chest Portable 1 View  Result Date: 07/22/2020 CLINICAL DATA:  79 year old with acute onset of respiratory distress that began last night. Personal history of LEFT atrial appendage clipping on 07/01/2020. EXAM: PORTABLE CHEST 1 VIEW COMPARISON:  07/12/2020 and earlier. FINDINGS: Cardiac silhouette UPPER normal in size for AP portable technique, unchanged. Thoracic aorta atherosclerotic, unchanged. Interval development of mild diffuse interstitial pulmonary edema. Moderate-sized LEFT pleural effusion and associated consolidation in the LEFT LOWER LOBE. dorsal cord stimulating device over the mid and lower thoracic spine as noted previously. IMPRESSION: 1. Mild CHF and/or fluid overload. 2.  Moderate-sized LEFT pleural effusion and associated passive atelectasis and/or pneumonia involving the LEFT LOWER LOBE. Electronically Signed   By: Evangeline Dakin M.D.   On: 07/22/2020 17:01    Cardiac Studies   TTE 07/13/20 1. Left ventricular ejection fraction, by estimation, is 55 to 60%. The  left ventricle has normal function. The left ventricle has no regional  wall motion abnormalities. There is mild asymmetric left ventricular  hypertrophy of the posterior-lateral  segment. Left ventricular diastolic parameters are indeterminate. Elevated  left ventricular end-diastolic pressure.  2. Right ventricular systolic function is mildly reduced. The right  ventricular size is mildly enlarged. There is normal pulmonary artery  systolic pressure. The estimated right ventricular systolic pressure is  23.7 mmHg.  3. Left atrial size was moderately dilated.  4. Right atrial size was severely dilated.  5. The mitral valve is abnormal. Trivial mitral valve regurgitation.  Moderate mitral annular calcification.  6. Tricuspid valve regurgitation is mild to moderate.  7. The aortic valve is grossly normal. Aortic valve regurgitation is not  visualized. No aortic stenosis is present.  8. The inferior vena cava is normal in size with greater than 50%  respiratory variability, suggesting right atrial pressure of 3 mmHg.  9. S/p 27 mm Watchman FLX 07/01/20, which is not well visualized or  assessed on this transthoracic echo.   Patient Profile     79 y.o. female with a history of recurrent atrial fibrillation and GI bleeding presented to the hospital with volume overload and rapid atrial fibrillation.  Assessment & Plan    1.  Paroxysmal atrial fibrillation: Likely due to her volume overload and fluid retention.  Currently on both metoprolol and diltiazem.  Has been started on an amiodarone drip for rate control.  We Damontay Alred continue her Eliquis and aspirin at her current dose that she has  recently had a watchman implanted.  2.  Chronic diastolic heart failure: Likely due to boluses with receiving blood recently.  Is currently getting diuresed and  is now out greater than 4 L.  Creatinine has remained stable.  We Chevon Laufer continue with diuresis.  3.  Anemia due to GI bleeding: Hemoglobin has remained stable on Eliquis.  Continue aspirin and Eliquis given recent watchman implant.     For questions or updates, please contact [place] Please consult [email or web address] for contact info under        Signed, Elexus Barman Meredith Leeds, MD  07/24/2020, 9:20 AM

## 2020-07-24 NOTE — Progress Notes (Signed)
PROGRESS NOTE    [name]  FXT:[id number] DOB: 1941-01-07 DOA: 07/22/2020 PCP: Leighton Ruff, MD   Brief Narrative: 79 year old with past medical history significant for recent GI bleed status post admission and discharge November 18, paroxysmal A. fib, chronic kidney disease a stage III, GERD remote history of asthma, diabetes recent watchman procedure who was discharged and now presents with worsening shortness of breath.  Patient was hypoxic on arrival requiring BiPAP.  Patient has been admitted for acute heart failure exacerbation.  Evaluation in the ED AVH pH 7.4 PCO2 49 PO2 121, chest x-ray showed mild CHF, fluid overload and moderate size left pleural effusion.  White count 15.    Assessment & Plan:   Principal Problem:   Acute on chronic respiratory failure with hypoxemia (HCC) Active Problems:   HTN (hypertension)   Diabetes mellitus (HCC)   Hypercholesterolemia   Paroxysmal atrial fibrillation (HCC)   Pleural effusion   1-Acute Hypoxic Respiratory failure patient required BiPAP on admission. Likely secondary to Acute  diastolic heart failure exacerbation and left pleural effusion. Off BIPAP this am on 3 L oxygen.  Continue with Diuretics.    2-Acute diastolic heart failure exacerbation, left side pleural effusion: --4.3 L -Weight 149----- -Treated initially with  IV Lasix 60 mg IV twice daily--- change to 40 mg IV BID on 11/26. -incentive spirometry.  -Repeated chest x ray with decreased in size pleural effusion.  -Appreciate cardiology evaluation.  -change lasix to oral.   3-Paroxysmal A. fib, RVR: Status post watchman procedure secondary to recurrent bleeds. Patient was restarted on anticoagulation, last admission.. Continue with Eliquis.  EP consulted. On Cardizem  gtt, metoprolol, digoxin.    Diabetes type 2: Continue with a sliding scale insulin On low dose lantus. Increase lantus to 20 units.   Hyperlipidemia: Continue with the  statins  Essential hypertension: Continue with lasix , cardizem./   Recent history of GI bleed Denies bleeding. Monitor hb.   Chronic meds;  Continue with  gabapentin, effexor.  On xanax at hs/  Multivitamins.   Estimated body mass index is 25.69 kg/m as calculated from the following:   Height as of this encounter: 5\' 4"  (1.626 m).   Weight as of this encounter: 67.9 kg.   DVT prophylaxis: Eliquis Code Status: Full code Family Communication: Care discussed with patient. And husband at bedside.  Disposition Plan:  Status is: Inpatient  Remains inpatient appropriate because:Hemodynamically unstable   Dispo: The patient is from: Home              Anticipated d/c is to: Home              Anticipated d/c date is: 3 days              Patient currently is not medically stable to d/c.        Consultants:   Cardiology   Procedures:   none  Antimicrobials:    Subjective: Dyspnea improved. Having nausea this morning. Denies abdominal pain.   Objective: Vitals:   07/23/20 2230 07/23/20 2322 07/24/20 0000 07/24/20 0404  BP: 125/65 126/63  (!) 135/57  Pulse:  81    Resp:  20 20   Temp:  98.4 F (36.9 C)  98.3 F (36.8 C)  TempSrc:  Oral  Oral  SpO2:      Weight:      Height:        Intake/Output Summary (Last 24 hours) at 07/24/2020 0724 Last data filed at 07/23/2020 2354  Gross per 24 hour  Intake 422.39 ml  Output 2925 ml  Net -2502.61 ml   Filed Weights   07/23/20 1640  Weight: 67.9 kg    Examination:  General exam: NAD Respiratory system: CTA Cardiovascular system: S 1, S 2 IRR Gastrointestinal system: BS present, soft, nt Central nervous system: alert Extremities: no edema   Data Reviewed: I have personally reviewed following labs and imaging studies  CBC: Recent Labs  Lab 07/22/20 1624 07/22/20 1728 07/23/20 0201  WBC 15.6*  --  11.5*  NEUTROABS 13.2*  --  8.8*  HGB 9.3* 9.9* 9.5*  HCT 31.9* 29.0* 31.8*  MCV 94.1  --  92.4   PLT 474*  --  657   Basic Metabolic Panel: Recent Labs  Lab 07/22/20 1624 07/22/20 1728 07/23/20 0201 07/24/20 0412  NA 137 139 137 136  K 4.1 3.9 4.0 3.7  CL 99  --  96* 89*  CO2 26  --  27 35*  GLUCOSE 106*  --  213* 143*  BUN 16  --  13 12  CREATININE 0.95  --  0.93 0.83  CALCIUM 9.2  --  9.3 9.4   GFR: Estimated Creatinine Clearance: 52.1 mL/min (by C-G formula based on SCr of 0.83 mg/dL). Liver Function Tests: Recent Labs  Lab 07/22/20 1624  AST 19  ALT 21  ALKPHOS 86  BILITOT 0.7  PROT 6.8  ALBUMIN 3.6   No results for input(s): LIPASE, AMYLASE in the last 168 hours. No results for input(s): AMMONIA in the last 168 hours. Coagulation Profile: No results for input(s): INR, PROTIME in the last 168 hours. Cardiac Enzymes: No results for input(s): CKTOTAL, CKMB, CKMBINDEX, TROPONINI in the last 168 hours. BNP (last 3 results) No results for input(s): PROBNP in the last 8760 hours. HbA1C: No results for input(s): HGBA1C in the last 72 hours. CBG: Recent Labs  Lab 07/22/20 2240 07/23/20 0752 07/23/20 1208 07/23/20 1635 07/23/20 2152  GLUCAP 85 237* 295* 218* 261*   Lipid Profile: No results for input(s): CHOL, HDL, LDLCALC, TRIG, CHOLHDL, LDLDIRECT in the last 72 hours. Thyroid Function Tests: Recent Labs    07/22/20 2225  TSH 0.549   Anemia Panel: No results for input(s): VITAMINB12, FOLATE, FERRITIN, TIBC, IRON, RETICCTPCT in the last 72 hours. Sepsis Labs: No results for input(s): PROCALCITON, LATICACIDVEN in the last 168 hours.  Recent Results (from the past 240 hour(s))  Resp Panel by RT-PCR (Flu A&B, Covid) Nasopharyngeal Swab     Status: None   Collection Time: 07/22/20  4:24 PM   Specimen: Nasopharyngeal Swab; Nasopharyngeal(NP) swabs in vial transport medium  Result Value Ref Range Status   SARS Coronavirus 2 by RT PCR NEGATIVE NEGATIVE Final    Comment: (NOTE) SARS-CoV-2 target nucleic acids are NOT DETECTED.  The SARS-CoV-2 RNA is  generally detectable in upper respiratory specimens during the acute phase of infection. The lowest concentration of SARS-CoV-2 viral copies this assay can detect is 138 copies/mL. A negative result does not preclude SARS-Cov-2 infection and should not be used as the sole basis for treatment or other patient management decisions. A negative result may occur with  improper specimen collection/handling, submission of specimen other than nasopharyngeal swab, presence of viral mutation(s) within the areas targeted by this assay, and inadequate number of viral copies(<138 copies/mL). A negative result must be combined with clinical observations, patient history, and epidemiological information. The expected result is Negative.  Fact Sheet for Patients:  EntrepreneurPulse.com.au  Fact Sheet for Healthcare  Providers:  IncredibleEmployment.be  This test is no t yet approved or cleared by the [place] and  has been authorized for detection and/or diagnosis of SARS-CoV-2 by FDA under an Emergency Use Authorization (EUA). This EUA will remain  in effect (meaning this test can be used) for the duration of the COVID-19 declaration under Section 564(b)(1) of the Act, 21 U.S.C.section 360bbb-3(b)(1), unless the authorization is terminated  or revoked sooner.       Influenza A by PCR NEGATIVE NEGATIVE Final   Influenza B by PCR NEGATIVE NEGATIVE Final    Comment: (NOTE) The Xpert Xpress SARS-CoV-2/FLU/RSV plus assay is intended as an aid in the diagnosis of influenza from Nasopharyngeal swab specimens and should not be used as a sole basis for treatment. Nasal washings and aspirates are unacceptable for Xpert Xpress SARS-CoV-2/FLU/RSV testing.  Fact Sheet for Patients: EntrepreneurPulse.com.au  Fact Sheet for Healthcare Providers: IncredibleEmployment.be  This test is not yet approved or cleared by the [place] FDA and has been authorized for detection and/or diagnosis of SARS-CoV-2 by FDA under an Emergency Use Authorization (EUA). This EUA will remain in effect (meaning this test can be used) for the duration of the COVID-19 declaration under Section 564(b)(1) of the Act, 21 U.S.C. section 360bbb-3(b)(1), unless the authorization is terminated or revoked.  Performed at [facility], [place] [address], [place], [place] [address]          Radiology Studies: DG Chest Portable 1 View  Result Date: 07/22/2020 CLINICAL DATA:  79 year old with acute onset of respiratory distress that began last night. Personal history of LEFT atrial appendage clipping on 07/01/2020. EXAM: PORTABLE CHEST 1 VIEW COMPARISON:  07/12/2020 and earlier. FINDINGS: Cardiac silhouette UPPER normal in size for AP portable technique, unchanged. Thoracic aorta atherosclerotic, unchanged. Interval development of mild diffuse interstitial pulmonary edema. Moderate-sized LEFT pleural effusion and associated consolidation in the LEFT LOWER LOBE. dorsal cord stimulating device over the mid and lower thoracic spine as noted previously. IMPRESSION: 1. Mild CHF and/or fluid overload. 2. Moderate-sized LEFT pleural effusion and associated passive atelectasis and/or pneumonia involving the LEFT LOWER LOBE. Electronically Signed   By: Evangeline Dakin M.D.   On: 07/22/2020 17:01        Scheduled Meds: . ALPRAZolam  0.25 mg Oral QHS  . apixaban  5 mg Oral BID  . digoxin  0.125 mg Oral Daily  . furosemide  40 mg Intravenous BID  . insulin aspart  0-9 Units Subcutaneous TID WC  . insulin glargine  12 Units Subcutaneous Daily  . metoprolol tartrate  50 mg Oral BID  . sodium chloride flush  3 mL Intravenous Q12H   Continuous Infusions: . sodium chloride    . diltiazem (CARDIZEM) infusion 5 mg/hr (07/24/20 0000)     LOS: 2 days    Time spent: 35 minutes    Faiz Weber A Alexina Niccoli, MD [facility]   If 7PM-7AM,  please contact night-coverage [email or web address]  07/24/2020, 7:24 AM

## 2020-07-25 DIAGNOSIS — J9621 Acute and chronic respiratory failure with hypoxia: Secondary | ICD-10-CM | POA: Diagnosis not present

## 2020-07-25 LAB — BASIC METABOLIC PANEL
Anion gap: 13 (ref 5–15)
BUN: 17 mg/dL (ref 8–23)
CO2: 36 mmol/L — ABNORMAL HIGH (ref 22–32)
Calcium: 9.1 mg/dL (ref 8.9–10.3)
Chloride: 87 mmol/L — ABNORMAL LOW (ref 98–111)
Creatinine, Ser: 1.26 mg/dL — ABNORMAL HIGH (ref 0.44–1.00)
GFR, Estimated: 43 mL/min — ABNORMAL LOW (ref >60)
Glucose, Bld: 217 mg/dL — ABNORMAL HIGH (ref 70–99)
Potassium: 3.8 mmol/L (ref 3.5–5.1)
Sodium: 136 mmol/L (ref 135–145)

## 2020-07-25 LAB — GLUCOSE, CAPILLARY
Glucose-Capillary: 199 mg/dL — ABNORMAL HIGH (ref 70–99)
Glucose-Capillary: 297 mg/dL — ABNORMAL HIGH (ref 70–99)
Glucose-Capillary: 263 mg/dL — ABNORMAL HIGH (ref 70–99)
Glucose-Capillary: 262 mg/dL — ABNORMAL HIGH (ref 70–99)

## 2020-07-25 LAB — CBC
HCT: 34.4 % — ABNORMAL LOW (ref 36.0–46.0)
Hemoglobin: 10.4 g/dL — ABNORMAL LOW (ref 12.0–15.0)
MCH: 26.9 pg (ref 26.0–34.0)
MCHC: 30.2 g/dL (ref 30.0–36.0)
MCV: 88.9 fL (ref 80.0–100.0)
Platelets: 389 10*3/uL (ref 150–400)
RBC: 3.87 MIL/uL (ref 3.87–5.11)
RDW: 14.2 % (ref 11.5–15.5)
WBC: 10.1 10*3/uL (ref 4.0–10.5)
nRBC: 0 % (ref 0.0–0.2)

## 2020-07-25 LAB — MAGNESIUM: Magnesium: 1.4 mg/dL — ABNORMAL LOW (ref 1.7–2.4)

## 2020-07-25 MED ORDER — INSULIN GLARGINE 100 UNIT/ML ~~LOC~~ SOLN
30.0000 [IU] | Freq: Every day | SUBCUTANEOUS | Status: DC
  Administered 2020-07-25 – 2020-07-26 (×2): 30 [IU] via SUBCUTANEOUS
  Filled 2020-07-25 (×4): qty 0.3

## 2020-07-25 MED ORDER — MAGNESIUM SULFATE 2 GM/50ML IV SOLN
2.0000 g | Freq: Once | INTRAVENOUS | Status: AC
  Administered 2020-07-25: 2 g via INTRAVENOUS
  Filled 2020-07-25: qty 50

## 2020-07-25 MED ORDER — FUROSEMIDE 40 MG PO TABS
40.0000 mg | ORAL_TABLET | Freq: Every day | ORAL | Status: DC
  Administered 2020-07-25 – 2020-07-27 (×3): 40 mg via ORAL
  Filled 2020-07-25 (×3): qty 1

## 2020-07-25 MED ORDER — METOPROLOL TARTRATE 100 MG PO TABS
100.0000 mg | ORAL_TABLET | Freq: Two times a day (BID) | ORAL | Status: DC
  Administered 2020-07-25 – 2020-07-27 (×5): 100 mg via ORAL
  Filled 2020-07-25 (×5): qty 1

## 2020-07-25 NOTE — TOC Initial Note (Addendum)
Transition of Care [facility]) - Initial/Assessment Note    Patient Details  Name: [name] MRN: [id number] Date of Birth: [birth year]  [facility]) CM/SW Contact:    Carles Collet, RN Phone Number: 07/25/2020, 9:02 AM  Clinical Narrative:            Damaris Schooner w patient over the phone. She is admitted from home, lives w spouse. She states that she has home oxygen, RW, and shower seat at home. She is active w [facility] for [facility] services, verified w liaison she can continue services after DC. Will need resumption orders.  PT eval pending.        Expected Discharge Plan: [place] Barriers to Discharge: Continued Medical Work up   Patient Goals and CMS Choice Patient states their goals for this hospitalization and ongoing recovery are:: to go home CMS Medicare.gov Compare Post Acute Care list provided to:: Patient Choice offered to / list presented to : Patient  Expected Discharge Plan and Services Expected Discharge Plan: [place]   Discharge Planning Services: CM Consult Post Acute Care Choice: Home Health                             HH Arranged: PT, OT [facility] Agency: [place] [facility] Agency Contacted: 07/25/20 Time HH Agency Contacted: [phone] Representative spoke with at [place]: Tommi Rumps  Prior Living Arrangements/Services   Lives with:: Spouse              Current home services: DME, Home PT    Activities of Daily Living Home Assistive Devices/Equipment: Cane (specify quad or straight), Walker (specify type) ADL Screening (condition at time of admission) Patient's cognitive ability adequate to safely complete daily activities?: Yes Is the patient deaf or have difficulty hearing?: Yes Does the patient have difficulty seeing, even when wearing glasses/contacts?: No Does the patient have difficulty concentrating, remembering, or making decisions?: No Patient able to express need for assistance with ADLs?:  Yes Does the patient have difficulty dressing or bathing?: No Independently performs ADLs?: Yes (appropriate for developmental age) Does the patient have difficulty walking or climbing stairs?: Yes Weakness of Legs: Both Weakness of Arms/Hands: None  Permission Sought/Granted                  Emotional Assessment              Admission diagnosis:  Acute on chronic respiratory failure, unspecified whether with hypoxia or hypercapnia (HCC) [J96.20] Acute on chronic respiratory failure with hypoxemia (HCC) [J96.21] Acute congestive heart failure, unspecified heart failure type ([place]) [I50.9] Patient Active Problem List   Diagnosis Date Noted  . Acute on chronic respiratory failure with hypoxemia ([place]) 07/22/2020  . Pleural effusion 07/22/2020  . SOB (shortness of breath) 07/12/2020  . ABLA (acute blood loss anemia) 07/12/2020  . Persistent atrial fibrillation ([place]) 07/01/2020  . Paroxysmal atrial fibrillation ([place]) 04/14/2020  . S/P left TKA 11/05/2017  . S/P total knee replacement 11/05/2017  . Diabetes mellitus ([place]) 12/25/2016  . Hypercholesterolemia 12/25/2016  . Ptosis of eyelid 10/09/2016  . Iron deficiency anemia 12/15/2015  . Acute GI bleeding 05/13/2015  . Acute blood loss anemia 05/13/2015  . Normocytic anemia 05/13/2015  . Allergic rhinitis 04/06/2015  . Asthma, chronic 09/08/2013  . Hypoxia 09/08/2013  . HTN (hypertension) 09/08/2013  . Depression 09/08/2013  . DM type 2 with diabetic peripheral neuropathy (  [place]) 09/08/2013  . Flu-like symptoms 09/07/2013   PCP:  Leighton Ruff, MD Pharmacy:   [id number] - [place], [place] [address] [facility] [place] [address] Phone: [phone] Fax: [phone]     Social Determinants of Health (SDOH) Interventions    Readmission Risk Interventions No flowsheet data found.

## 2020-07-25 NOTE — Progress Notes (Signed)
Progress Note  Patient Name: HAVANAH NELMS Date of Encounter: 07/25/2020  [facility] Cardiologist: Candee Furbish, MD   Subjective   Feeling much improved today.  Nausea has gone away.  She feels better after sleeping last night.  Inpatient Medications    Scheduled Meds: . ALPRAZolam  0.25 mg Oral QHS  . apixaban  5 mg Oral BID  . digoxin  0.125 mg Oral Daily  . furosemide  40 mg Oral Daily  . gabapentin  600 mg Oral TID  . insulin aspart  0-9 Units Subcutaneous TID WC  . insulin glargine  20 Units Subcutaneous Daily  . metoprolol tartrate  100 mg Oral BID  . multivitamins with iron  1 tablet Oral QPM  . pantoprazole  80 mg Oral Q1200  . sodium chloride flush  3 mL Intravenous Q12H  . venlafaxine  37.5 mg Oral BID   Continuous Infusions: . sodium chloride    . diltiazem (CARDIZEM) infusion 5 mg/hr (07/25/20 0055)  . magnesium sulfate bolus IVPB     PRN Meds: sodium chloride, acetaminophen, albuterol, docusate sodium, ondansetron (ZOFRAN) IV, promethazine, sodium chloride flush   Vital Signs    Vitals:   07/24/20 1554 07/24/20 2107 07/25/20 0019 07/25/20 0452  BP: (!) 123/58  (!) 109/52 131/62  Pulse: 88  80 64  Resp: 20 20 17 16   Temp: 98.4 F (36.9 C) 98.6 F (37 C) 97.8 F (36.6 C) 98.1 F (36.7 C)  TempSrc: Oral Oral Oral Oral  SpO2: 99%  98% 97%  Weight:      Height:        Intake/Output Summary (Last 24 hours) at 07/25/2020 0819 Last data filed at 07/25/2020 2458 Gross per 24 hour  Intake 100 ml  Output 1500 ml  Net -1400 ml   Last 3 Weights 07/23/2020 07/14/2020 07/12/2020  Weight (lbs) 149 lb 11.1 oz 157 lb 6.5 oz 157 lb 6.5 oz  Weight (kg) 67.9 kg 71.4 kg 71.4 kg      Telemetry    Atrial fibrillation- Personally Reviewed  ECG    None new- Personally Reviewed  Physical Exam   GEN: Well nourished, well developed, in no acute distress  HEENT: normal  Neck: no JVD, carotid bruits, or masses Cardiac: Irregular, no murmurs, rubs,  or gallops,no edema  Respiratory:  clear to auscultation bilaterally, normal work of breathing GI: soft, nontender, nondistended, + BS MS: no deformity or atrophy  Skin: warm and dry Neuro:  Strength and sensation are intact Psych: euthymic mood, full affect   Labs    High Sensitivity Troponin:   Recent Labs  Lab 07/12/20 1626 07/22/20 1624 07/22/20 1819  TROPONINIHS 4 11 11       Chemistry Recent Labs  Lab 07/22/20 1624 07/22/20 1728 07/23/20 0201 07/24/20 0412 07/25/20 0056  NA 137   < > 137 136 136  K 4.1   < > 4.0 3.7 3.8  CL 99   < > 96* 89* 87*  CO2 26   < > 27 35* 36*  GLUCOSE 106*   < > 213* 143* 217*  BUN 16   < > 13 12 17   CREATININE 0.95   < > 0.93 0.83 1.26*  CALCIUM 9.2   < > 9.3 9.4 9.1  PROT 6.8  --   --   --   --   ALBUMIN 3.6  --   --   --   --   AST 19  --   --   --   --  ALT 21  --   --   --   --   ALKPHOS 86  --   --   --   --   BILITOT 0.7  --   --   --   --   GFRNONAA >60   < > >60 >60 43*  ANIONGAP 12   < > 14 12 13    < > = values in this interval not displayed.     Hematology Recent Labs  Lab 07/22/20 1624 07/22/20 1624 07/22/20 1728 07/23/20 0201 07/25/20 0710  WBC 15.6*  --   --  11.5* 10.1  RBC 3.39*  --   --  3.44* 3.87  HGB 9.3*   < > 9.9* 9.5* 10.4*  HCT 31.9*   < > 29.0* 31.8* 34.4*  MCV 94.1  --   --  92.4 88.9  MCH 27.4  --   --  27.6 26.9  MCHC 29.2*  --   --  29.9* 30.2  RDW 14.7  --   --  14.8 14.2  PLT 474*  --   --  391 389   < > = values in this interval not displayed.    BNP Recent Labs  Lab 07/22/20 1624  BNP 347.4*     DDimer No results for input(s): DDIMER in the last 168 hours.   Radiology    DG Chest 2 View  Result Date: 07/24/2020 CLINICAL DATA:  Pleural effusion per order. EXAM: CHEST - 2 VIEW COMPARISON:  July 22, 2020. FINDINGS: Similar cardiomediastinal silhouette. Aortic atherosclerosis. Small left pleural effusion. Overlying left basilar retrocardiac opacity. No acute osseous  abnormality. Thoracic vertebral body vertebral augmentation. Partially imaged thoracic spinal cord stimulator. Osteopenia. IMPRESSION: 1. Small left pleural effusion, likely decreased in size. 2. Overlying left basilar retrocardiac opacity, favor atelectasis. Electronically Signed   By: Margaretha Sheffield MD   On: 07/24/2020 11:01    Cardiac Studies   TTE 07/13/20 1. Left ventricular ejection fraction, by estimation, is 55 to 60%. The  left ventricle has normal function. The left ventricle has no regional  wall motion abnormalities. There is mild asymmetric left ventricular  hypertrophy of the posterior-lateral  segment. Left ventricular diastolic parameters are indeterminate. Elevated  left ventricular end-diastolic pressure.  2. Right ventricular systolic function is mildly reduced. The right  ventricular size is mildly enlarged. There is normal pulmonary artery  systolic pressure. The estimated right ventricular systolic pressure is  82.9 mmHg.  3. Left atrial size was moderately dilated.  4. Right atrial size was severely dilated.  5. The mitral valve is abnormal. Trivial mitral valve regurgitation.  Moderate mitral annular calcification.  6. Tricuspid valve regurgitation is mild to moderate.  7. The aortic valve is grossly normal. Aortic valve regurgitation is not  visualized. No aortic stenosis is present.  8. The inferior vena cava is normal in size with greater than 50%  respiratory variability, suggesting right atrial pressure of 3 mmHg.  9. S/p 27 mm Watchman FLX 07/01/20, which is not well visualized or  assessed on this transthoracic echo.   Patient Profile     79 y.o. female with a history of recurrent atrial fibrillation and GI bleeding presented to the hospital with volume overload and rapid atrial fibrillation.  Assessment & Plan    1.  Paroxysmal atrial fibrillation: Likely due to volume overload and fluid retention.  Currently on metoprolol and diltiazem.   Fortunately her heart rate has been better controlled.  We [place] stop diltiazem  and increase her home dose of metoprolol to 100 mg.  Would continue both Eliquis and aspirin as she has recently had a watchman implanted.    2.  Chronic diastolic heart failure: Likely due to boluses received and recent blood.  Is now 5.7 L.  Creatinine has increased mildly.  Lasix changed to 40 mg daily.  3.  Anemia due to GI bleeding: Hemoglobin has remained stable.  Continue Eliquis.     For questions or updates, please contact [place] Please consult [email or web address] for contact info under        Signed, Koehn Salehi Meredith Leeds, MD  07/25/2020, 8:19 AM

## 2020-07-25 NOTE — Evaluation (Signed)
Physical Therapy Evaluation Patient Details Name: EURA MCCAUSLIN MRN: [id number] DOB: 05-29-1941 Today's Date: 07/25/2020   History of Present Illness  79 y.o. female admitted on 07/22/20 for SOB.  Pt found to have acute hypoxic respiratory failure requiring BiPAP on admission.  Dx with acute diastolic heart failure exacerbation and L pleural effusion.  PAF RVR.  Pt with significant PMH of recent GIB, essential HTN, DM2, PVD, Fe deficient anemia, CKD, B TKA.    Clinical Impression  Pt is generally weak from being hospitalized multiple times recently.  She was active with home therapy PTA and would like to resume this when she returns home.  She has a supportive husband who can be home helping her as needed.   PT to follow acutely for deficits listed below.    Follow Up Recommendations Home health PT;Supervision for mobility/OOB    Equipment Recommendations  None recommended by PT    Recommendations for Other Services       Precautions / Restrictions Precautions Precautions: Fall Precaution Comments: multiple due to neuropathy      Mobility  Bed Mobility Overal bed mobility: Needs Assistance Bed Mobility: Supine to Sit     Supine to sit: Min assist;HOB elevated     General bed mobility comments: Min hand held assist to come up to sitting EOB.  Min assist to scoot until feet touch.     Transfers Overall transfer level: Needs assistance Equipment used: 1 person hand held assist Transfers: Sit to/from [facility] Sit to Stand: Min assist Stand pivot transfers: Min assist       General transfer comment: Min hand held assist to stand and pivot to recliner chair.  Pt did not feel up for even short distance gait around the end of the bed.  "I am just so weak"  Ambulation/Gait                Stairs            Wheelchair Mobility    Modified Rankin (Stroke Patients Only)       Balance Overall balance assessment: Needs  assistance Sitting-balance support: Feet supported;Bilateral upper extremity supported Sitting balance-Leahy Scale: Fair     Standing balance support: Single extremity supported Standing balance-Leahy Scale: Poor Standing balance comment: needs external support in standing.                              Pertinent Vitals/Pain Pain Assessment: No/denies pain    Home Living Family/patient expects to be discharged to:: Private residence Living Arrangements: Spouse/significant other (married 5 years) Available Help at Discharge: Family Type of Home: House Home Access: Stairs to enter Entrance Stairs-Rails: Psychiatric nurse of Steps: 3 Home Layout: One level Home Equipment: Environmental consultant - 2 wheels Additional Comments: home O2    Prior Function Level of Independence: Needs assistance   Gait / Transfers Assistance Needed: uses RW when needed  ADL's / Homemaking Assistance Needed: spouse assists PRN  Comments: active with home therapy     Hand Dominance   Dominant Hand: Right    Extremity/Trunk Assessment   Upper Extremity Assessment Upper Extremity Assessment: RUE deficits/detail RUE Deficits / Details: pt reports sleeping wrong on her R shoulder and is now unable to raise it above 90 degrees.     Lower Extremity Assessment Lower Extremity Assessment: Generalized weakness    Cervical / Trunk Assessment Cervical / Trunk Assessment: Normal  Communication  Communication: No difficulties  Cognition Arousal/Alertness: Awake/alert Behavior During Therapy: WFL for tasks assessed/performed Overall Cognitive Status: Within Functional Limits for tasks assessed                                        General Comments General comments (skin integrity, edema, etc.): VSS on 2L O2     Exercises General Exercises - Upper Extremity Shoulder Flexion: AROM;Left;10 reps Elbow Flexion: AROM;Left;10 reps (unable to do R as elbow has been  immobilized due to IV ) General Exercises - Lower Extremity Ankle Circles/Pumps: AROM;Both;20 reps Long Arc Quad: AROM;Both;10 reps Hip Flexion/Marching: AROM;Both;10 reps   Assessment/Plan    PT Assessment Patient needs continued PT services  PT Problem List Decreased strength;Decreased activity tolerance;Decreased balance;Decreased mobility;Decreased knowledge of use of DME;Cardiopulmonary status limiting activity       PT Treatment Interventions DME instruction;Gait training;Stair training;Functional mobility training;Therapeutic activities;Therapeutic exercise;Balance training;Patient/family education    PT Goals (Current goals can be found in the Care Plan section)  Acute Rehab PT Goals Patient Stated Goal: to get back home and get her strength back PT Goal Formulation: With patient/family Time For Goal Achievement: 08/08/20 Potential to Achieve Goals: Good    Frequency Min 3X/week   Barriers to discharge        Co-evaluation               AM-PAC PT "6 Clicks" Mobility  Outcome Measure Help needed turning from your back to your side while in a flat bed without using bedrails?: A Little Help needed moving from lying on your back to sitting on the side of a flat bed without using bedrails?: A Little Help needed moving to and from a bed to a chair (including a wheelchair)?: A Little Help needed standing up from a chair using your arms (e.g., wheelchair or bedside chair)?: A Little Help needed to walk in hospital room?: A Little Help needed climbing 3-5 steps with a railing? : A Little 6 Click Score: 18    End of Session Equipment Utilized During Treatment: Oxygen Activity Tolerance: Patient limited by fatigue Patient left: in chair;with call bell/phone within reach;with family/visitor present (husband in room) Nurse Communication: Mobility status PT Visit Diagnosis: Muscle weakness (generalized) (M62.81);Difficulty in walking, not elsewhere classified (R26.2)     Time: 6063-0160 PT Time Calculation (min) (ACUTE ONLY): 29 min   Charges:   PT Evaluation $PT Eval Moderate Complexity: 1 Mod PT Treatments $Therapeutic Activity: 8-22 mins       Verdene Lennert, PT, DPT  Acute Rehabilitation [phone] pager [phone]) [phone] office

## 2020-07-25 NOTE — Progress Notes (Signed)
Patient resting comfortably with no respiratory distress noted. BIPAP not needed at this time. RT will monitor as needed.

## 2020-07-25 NOTE — Progress Notes (Signed)
PROGRESS NOTE    [name]  IEP:[id number] DOB: 10-25-1940 DOA: 07/22/2020 PCP: Leighton Ruff, MD   Brief Narrative: 79 year old with past medical history significant for recent GI bleed status post admission and discharge November 18, paroxysmal A. fib, chronic kidney disease a stage III, GERD remote history of asthma, diabetes recent watchman procedure who was discharged and now presents with worsening shortness of breath.  Patient was hypoxic on arrival requiring BiPAP.  Patient has been admitted for acute heart failure exacerbation.  Evaluation in the ED AVH pH 7.4 PCO2 49 PO2 121, chest x-ray showed mild CHF, fluid overload and moderate size left pleural effusion.  White count 15.    Assessment & Plan:   Principal Problem:   Acute on chronic respiratory failure with hypoxemia (HCC) Active Problems:   HTN (hypertension)   Diabetes mellitus (HCC)   Hypercholesterolemia   Paroxysmal atrial fibrillation (HCC)   Pleural effusion   1-Acute Hypoxic Respiratory failure patient required BiPAP on admission. Likely secondary to Acute  diastolic heart failure exacerbation and left pleural effusion. Off BIPAP. Weaning oxygen.  Chest x ray effusion decreased in size.    2-Acute diastolic heart failure exacerbation, left side pleural effusion: --5.7  L -Weight 149----- -Treated initially with  IV Lasix 60 mg IV twice daily--- change to 40 mg IV BID on 11/26. -incentive spirometry.  -Repeated chest x ray with decreased in size pleural effusion.  -Appreciate cardiology evaluation.  -change lasix to oral. Daily   3-Paroxysmal A. fib, RVR: Status post watchman procedure secondary to recurrent bleeds. Patient was restarted on anticoagulation, last admission.. Continue with Eliquis.  EP consulted. On Cardizem  gtt, metoprolol, digoxin.  Plan to stop Cardizem and increase metoprolol.   Diabetes type 2: Continue with a sliding scale insulin On low dose lantus. Increase  lantus to 30 units.   Hyperlipidemia: Continue with the statins  Essential hypertension: Continue with lasix , cardizem./   Recent history of GI bleed Denies bleeding. Monitor hb.   Chronic meds;  Continue with  gabapentin, effexor.  On xanax at hs/  Multivitamins.   Hypomagnesemia; replete IV.   Estimated body mass index is 25.69 kg/m as calculated from the following:   Height as of this encounter: 5\' 4"  (1.626 m).   Weight as of this encounter: 67.9 kg.   DVT prophylaxis: Eliquis Code Status: Full code Family Communication: Care discussed with patient. And husband at bedside.  Disposition Plan:  Status is: Inpatient  Remains inpatient appropriate because:Hemodynamically unstable   Dispo: The patient is from: Home              Anticipated d/c is to: Home              Anticipated d/c date is: 3 days              Patient currently is not medically stable to d/c.        Consultants:   Cardiology   Procedures:   none  Antimicrobials:    Subjective: Denies dyspnea, nausea improved.   Objective: Vitals:   07/25/20 0452 07/25/20 0822 07/25/20 0910 07/25/20 1110  BP: 131/62  112/66 126/71  Pulse: 64 97 80 (!) 59  Resp: 16   18  Temp: 98.1 F (36.7 C)   97.7 F (36.5 C)  TempSrc: Oral   Oral  SpO2: 97%   99%  Weight:      Height:        Intake/Output Summary (Last 24 hours)  at 07/25/2020 1323 Last data filed at 07/25/2020 4097 Gross per 24 hour  Intake 100 ml  Output 1150 ml  Net -1050 ml   Filed Weights   07/23/20 1640  Weight: 67.9 kg    Examination:  General exam: NAD Respiratory system: CTA Cardiovascular system: S 1, S 2 RRR Gastrointestinal system: BS present, soft, nt Central nervous system: Alert Extremities: no edema   Data Reviewed: I have personally reviewed following labs and imaging studies  CBC: Recent Labs  Lab 07/22/20 1624 07/22/20 1728 07/23/20 0201 07/25/20 0710  WBC 15.6*  --  11.5* 10.1  NEUTROABS  13.2*  --  8.8*  --   HGB 9.3* 9.9* 9.5* 10.4*  HCT 31.9* 29.0* 31.8* 34.4*  MCV 94.1  --  92.4 88.9  PLT 474*  --  391 353   Basic Metabolic Panel: Recent Labs  Lab 07/22/20 1624 07/22/20 1728 07/23/20 0201 07/24/20 0412 07/25/20 0056 07/25/20 0710  NA 137 139 137 136 136  --   K 4.1 3.9 4.0 3.7 3.8  --   CL 99  --  96* 89* 87*  --   CO2 26  --  27 35* 36*  --   GLUCOSE 106*  --  213* 143* 217*  --   BUN 16  --  13 12 17   --   CREATININE 0.95  --  0.93 0.83 1.26*  --   CALCIUM 9.2  --  9.3 9.4 9.1  --   MG  --   --   --   --   --  1.4*   GFR: Estimated Creatinine Clearance: 34.3 mL/min (A) (by C-G formula based on SCr of 1.26 mg/dL (H)). Liver Function Tests: Recent Labs  Lab 07/22/20 1624  AST 19  ALT 21  ALKPHOS 86  BILITOT 0.7  PROT 6.8  ALBUMIN 3.6   No results for input(s): LIPASE, AMYLASE in the last 168 hours. No results for input(s): AMMONIA in the last 168 hours. Coagulation Profile: No results for input(s): INR, PROTIME in the last 168 hours. Cardiac Enzymes: No results for input(s): CKTOTAL, CKMB, CKMBINDEX, TROPONINI in the last 168 hours. BNP (last 3 results) No results for input(s): PROBNP in the last 8760 hours. HbA1C: No results for input(s): HGBA1C in the last 72 hours. CBG: Recent Labs  Lab 07/24/20 1203 07/24/20 1556 07/24/20 2131 07/25/20 0808 07/25/20 1114  GLUCAP 343* 233* 260* 199* 297*   Lipid Profile: No results for input(s): CHOL, HDL, LDLCALC, TRIG, CHOLHDL, LDLDIRECT in the last 72 hours. Thyroid Function Tests: Recent Labs    07/22/20 2225  TSH 0.549   Anemia Panel: No results for input(s): VITAMINB12, FOLATE, FERRITIN, TIBC, IRON, RETICCTPCT in the last 72 hours. Sepsis Labs: No results for input(s): PROCALCITON, LATICACIDVEN in the last 168 hours.  Recent Results (from the past 240 hour(s))  Resp Panel by RT-PCR (Flu A&B, Covid) Nasopharyngeal Swab     Status: None   Collection Time: 07/22/20  4:24 PM    Specimen: Nasopharyngeal Swab; Nasopharyngeal(NP) swabs in vial transport medium  Result Value Ref Range Status   SARS Coronavirus 2 by RT PCR NEGATIVE NEGATIVE Final    Comment: (NOTE) SARS-CoV-2 target nucleic acids are NOT DETECTED.  The SARS-CoV-2 RNA is generally detectable in upper respiratory specimens during the acute phase of infection. The lowest concentration of SARS-CoV-2 viral copies this assay can detect is 138 copies/mL. A negative result does not preclude SARS-Cov-2 infection and should not be used as the  sole basis for treatment or other patient management decisions. A negative result may occur with  improper specimen collection/handling, submission of specimen other than nasopharyngeal swab, presence of viral mutation(s) within the areas targeted by this assay, and inadequate number of viral copies(<138 copies/mL). A negative result must be combined with clinical observations, patient history, and epidemiological information. The expected result is Negative.  Fact Sheet for Patients:  EntrepreneurPulse.com.au  Fact Sheet for Healthcare Providers:  IncredibleEmployment.be  This test is no t yet approved or cleared by the [place] FDA and  has been authorized for detection and/or diagnosis of SARS-CoV-2 by FDA under an Emergency Use Authorization (EUA). This EUA will remain  in effect (meaning this test can be used) for the duration of the COVID-19 declaration under Section 564(b)(1) of the Act, 21 U.S.C.section 360bbb-3(b)(1), unless the authorization is terminated  or revoked sooner.       Influenza A by PCR NEGATIVE NEGATIVE Final   Influenza B by PCR NEGATIVE NEGATIVE Final    Comment: (NOTE) The Xpert Xpress SARS-CoV-2/FLU/RSV plus assay is intended as an aid in the diagnosis of influenza from Nasopharyngeal swab specimens and should not be used as a sole basis for treatment. Nasal washings and aspirates are  unacceptable for Xpert Xpress SARS-CoV-2/FLU/RSV testing.  Fact Sheet for Patients: EntrepreneurPulse.com.au  Fact Sheet for Healthcare Providers: IncredibleEmployment.be  This test is not yet approved or cleared by the [place] FDA and has been authorized for detection and/or diagnosis of SARS-CoV-2 by FDA under an Emergency Use Authorization (EUA). This EUA will remain in effect (meaning this test can be used) for the duration of the COVID-19 declaration under Section 564(b)(1) of the Act, 21 U.S.C. section 360bbb-3(b)(1), unless the authorization is terminated or revoked.  Performed at [facility], [place] [address], [place], [place] [address]          Radiology Studies: DG Chest 2 View  Result Date: 07/24/2020 CLINICAL DATA:  Pleural effusion per order. EXAM: CHEST - 2 VIEW COMPARISON:  July 22, 2020. FINDINGS: Similar cardiomediastinal silhouette. Aortic atherosclerosis. Small left pleural effusion. Overlying left basilar retrocardiac opacity. No acute osseous abnormality. Thoracic vertebral body vertebral augmentation. Partially imaged thoracic spinal cord stimulator. Osteopenia. IMPRESSION: 1. Small left pleural effusion, likely decreased in size. 2. Overlying left basilar retrocardiac opacity, favor atelectasis. Electronically Signed   By: Margaretha Sheffield MD   On: 07/24/2020 11:01        Scheduled Meds:  ALPRAZolam  0.25 mg Oral QHS   apixaban  5 mg Oral BID   digoxin  0.125 mg Oral Daily   furosemide  40 mg Oral Daily   gabapentin  600 mg Oral TID   insulin aspart  0-9 Units Subcutaneous TID WC   insulin glargine  20 Units Subcutaneous Daily   metoprolol tartrate  100 mg Oral BID   multivitamins with iron  1 tablet Oral QPM   pantoprazole  80 mg Oral Q1200   sodium chloride flush  3 mL Intravenous Q12H   venlafaxine  37.5 mg Oral BID   Continuous Infusions:  sodium chloride 250 mL (07/25/20  0916)   diltiazem (CARDIZEM) infusion 5 mg/hr (07/25/20 0055)     LOS: 3 days    Time spent: 35 minutes    Delane Wessinger A Jannelle Notaro, MD [facility]   If 7PM-7AM, please contact night-coverage [email or web address]  07/25/2020, 1:23 PM

## 2020-07-26 DIAGNOSIS — K922 Gastrointestinal hemorrhage, unspecified: Secondary | ICD-10-CM | POA: Diagnosis not present

## 2020-07-26 DIAGNOSIS — J9621 Acute and chronic respiratory failure with hypoxia: Secondary | ICD-10-CM | POA: Diagnosis not present

## 2020-07-26 DIAGNOSIS — I480 Paroxysmal atrial fibrillation: Secondary | ICD-10-CM | POA: Diagnosis not present

## 2020-07-26 LAB — CBC
HCT: 32.7 % — ABNORMAL LOW (ref 36.0–46.0)
Hemoglobin: 10.1 g/dL — ABNORMAL LOW (ref 12.0–15.0)
MCH: 27 pg (ref 26.0–34.0)
MCHC: 30.9 g/dL (ref 30.0–36.0)
MCV: 87.4 fL (ref 80.0–100.0)
Platelets: 396 10*3/uL (ref 150–400)
RBC: 3.74 MIL/uL — ABNORMAL LOW (ref 3.87–5.11)
RDW: 14 % (ref 11.5–15.5)
WBC: 13.3 10*3/uL — ABNORMAL HIGH (ref 4.0–10.5)
nRBC: 0 % (ref 0.0–0.2)

## 2020-07-26 LAB — MAGNESIUM: Magnesium: 1.8 mg/dL (ref 1.7–2.4)

## 2020-07-26 LAB — BASIC METABOLIC PANEL
Anion gap: 9 (ref 5–15)
BUN: 21 mg/dL (ref 8–23)
CO2: 36 mmol/L — ABNORMAL HIGH (ref 22–32)
Calcium: 9.1 mg/dL (ref 8.9–10.3)
Chloride: 88 mmol/L — ABNORMAL LOW (ref 98–111)
Creatinine, Ser: 1.08 mg/dL — ABNORMAL HIGH (ref 0.44–1.00)
GFR, Estimated: 52 mL/min — ABNORMAL LOW (ref >60)
Glucose, Bld: 216 mg/dL — ABNORMAL HIGH (ref 70–99)
Potassium: 3.3 mmol/L — ABNORMAL LOW (ref 3.5–5.1)
Sodium: 133 mmol/L — ABNORMAL LOW (ref 135–145)

## 2020-07-26 LAB — GLUCOSE, CAPILLARY
Glucose-Capillary: 150 mg/dL — ABNORMAL HIGH (ref 70–99)
Glucose-Capillary: 320 mg/dL — ABNORMAL HIGH (ref 70–99)
Glucose-Capillary: 168 mg/dL — ABNORMAL HIGH (ref 70–99)
Glucose-Capillary: 286 mg/dL — ABNORMAL HIGH (ref 70–99)

## 2020-07-26 MED ORDER — POTASSIUM CHLORIDE CRYS ER 20 MEQ PO TBCR
40.0000 meq | EXTENDED_RELEASE_TABLET | Freq: Every day | ORAL | 1 refills | Status: DC
Start: 2020-07-26 — End: 2020-08-23

## 2020-07-26 MED ORDER — MAGNESIUM OXIDE 400 (241.3 MG) MG PO TABS
200.0000 mg | ORAL_TABLET | Freq: Two times a day (BID) | ORAL | Status: DC
  Administered 2020-07-26 – 2020-07-27 (×3): 200 mg via ORAL
  Filled 2020-07-26 (×3): qty 1

## 2020-07-26 MED ORDER — POTASSIUM CHLORIDE CRYS ER 20 MEQ PO TBCR
40.0000 meq | EXTENDED_RELEASE_TABLET | Freq: Once | ORAL | Status: AC
  Administered 2020-07-26: 40 meq via ORAL
  Filled 2020-07-26: qty 2

## 2020-07-26 MED ORDER — ASPIRIN EC 81 MG PO TBEC
81.0000 mg | DELAYED_RELEASE_TABLET | Freq: Every day | ORAL | Status: DC
  Administered 2020-07-26 – 2020-07-27 (×2): 81 mg via ORAL
  Filled 2020-07-26 (×2): qty 1

## 2020-07-26 MED ORDER — FUROSEMIDE 40 MG PO TABS
40.0000 mg | ORAL_TABLET | Freq: Every day | ORAL | 2 refills | Status: DC
Start: 2020-07-26 — End: 2020-08-06

## 2020-07-26 MED ORDER — MAGNESIUM OXIDE 400 (241.3 MG) MG PO TABS
200.0000 mg | ORAL_TABLET | Freq: Two times a day (BID) | ORAL | 0 refills | Status: DC
Start: 2020-07-26 — End: 2020-08-23

## 2020-07-26 MED ORDER — METOPROLOL TARTRATE 100 MG PO TABS
100.0000 mg | ORAL_TABLET | Freq: Two times a day (BID) | ORAL | 0 refills | Status: DC
Start: 2020-07-26 — End: 2022-09-19

## 2020-07-26 MED ORDER — DILTIAZEM HCL 60 MG PO TABS
30.0000 mg | ORAL_TABLET | Freq: Four times a day (QID) | ORAL | Status: DC
  Administered 2020-07-26 – 2020-07-27 (×4): 30 mg via ORAL
  Filled 2020-07-26 (×4): qty 1

## 2020-07-26 MED ORDER — DIGOXIN 125 MCG PO TABS
0.1250 mg | ORAL_TABLET | Freq: Every day | ORAL | 2 refills | Status: DC
Start: 2020-07-26 — End: 2020-07-26

## 2020-07-26 NOTE — TOC Transition Note (Addendum)
Transition of Care [facility]) - CM/SW Discharge Note   Patient Details  Name: [name] MRN: [id number] Date of Birth: Jul 28, 1941  [facility] CM/SW Contact:  Zenon Mayo, RN Phone Number: 07/26/2020, 9:03 AM   Clinical Narrative:    Patient is set up with [facility], [place] notified Tommi Rumps with [facility] and NCM called patient in the room to confirm.  She is for dc today. Per Staff RN Discharged canceled, they are adjusting patient's medications , so probably will dc tomorrow.   Final next level of care: [place] Barriers to Discharge: No Barriers Identified   Patient Goals and CMS Choice Patient states their goals for this hospitalization and ongoing recovery are:: to go home CMS Medicare.gov Compare Post Acute Care list provided to:: Patient Choice offered to / list presented to : Patient  Discharge Placement                       Discharge Plan and Services   Discharge Planning Services: CM Consult Post Acute Care Choice: Home Health                    HH Arranged: PT, OT [facility] Agency: [place] Date [place]: 07/25/20 Time HH Agency Contacted: [phone] Representative spoke with at [place]: [place] ([place]) Interventions     Readmission Risk Interventions No flowsheet data found.

## 2020-07-26 NOTE — Discharge Instructions (Signed)
Heart Failure, Diagnosis  Heart failure means that your heart is not able to pump blood in the right way. This makes it hard for your body to work well. Heart failure is usually a long-term (chronic) condition. You must take good care of yourself and follow your treatment plan from your doctor. What are the causes? This condition may be caused by:  High blood pressure.  Build up of cholesterol and fat in the arteries.  Heart attack. This injures the heart muscle.  Heart valves that do not open and close properly.  Damage of the heart muscle. This is also called cardiomyopathy.  Lung disease.  Abnormal heart rhythms. What increases the risk? The risk of heart failure goes up as a person ages. This condition is also more likely to develop in people who:  Are overweight.  Are female.  Smoke or chew tobacco.  Abuse alcohol or illegal drugs.  Have taken medicines that can damage the heart.  Have diabetes.  Have abnormal heart rhythms.  Have thyroid problems.  Have low blood counts (anemia). What are the signs or symptoms? Symptoms of this condition include:  Shortness of breath.  Coughing.  Swelling of the feet, ankles, legs, or belly.  Losing weight for no reason.  Trouble breathing.  Waking from sleep because of the need to sit up and get more air.  Rapid heartbeat.  Being very tired.  Feeling dizzy, or feeling like you may pass out (faint).  Having no desire to eat.  Feeling like you may vomit (nauseous).  Peeing (urinating) more at night.  Feeling confused. How is this treated?     This condition may be treated with:  Medicines. These can be given to treat blood pressure and to make the heart muscles stronger.  Changes in your daily life. These may include eating a healthy diet, staying at a healthy body weight, quitting tobacco and illegal drug use, or doing exercises.  Surgery. Surgery can be done to open blocked valves, or to put devices in  the heart, such as pacemakers.  A donor heart (heart transplant). You will receive a healthy heart from a donor. Follow these instructions at home:  Treat other conditions as told by your doctor. These may include high blood pressure, diabetes, thyroid disease, or abnormal heart rhythms.  Learn as much as you can about heart failure.  Get support as you need it.  Keep all follow-up visits as told by your doctor. This is important. Summary  Heart failure means that your heart is not able to pump blood in the right way.  This condition is caused by high blood pressure, heart attack, or damage of the heart muscle.  Symptoms of this condition include shortness of breath and swelling of the feet, ankles, legs, or belly. You may also feel very tired or feel like you may vomit.  You may be treated with medicines, surgery, or changes in your daily life.  Treat other health conditions as told by your doctor. This information is not intended to replace advice given to you by your health care provider. Make sure you discuss any questions you have with your health care provider. Document Revised: 11/01/2018 Document Reviewed: 11/01/2018 Elsevier Patient Education  [place].   If your weight increase more than 2 pound in 24 hours take 20 mg extra of lasix that day.

## 2020-07-26 NOTE — Progress Notes (Signed)
PROGRESS NOTE    [name]  UXN:[id number] DOB: 06/08/1941 DOA: 07/22/2020 PCP: Leighton Ruff, MD   Brief Narrative: 79 year old with past medical history significant for recent GI bleed status post admission and discharge November 18, paroxysmal A. fib, chronic kidney disease a stage III, GERD remote history of asthma, diabetes recent watchman procedure who was discharged and now presents with worsening shortness of breath.  Patient was hypoxic on arrival requiring BiPAP.  Patient has been admitted for acute heart failure exacerbation.  Evaluation in the ED AVH pH 7.4 PCO2 49 PO2 121, chest x-ray showed mild CHF, fluid overload and moderate size left pleural effusion.  White count 15.    Assessment & Plan:   Principal Problem:   Acute on chronic respiratory failure with hypoxemia (HCC) Active Problems:   HTN (hypertension)   Diabetes mellitus (HCC)   Hypercholesterolemia   Paroxysmal atrial fibrillation (HCC)   Pleural effusion   1-Acute Hypoxic Respiratory failure patient required BiPAP on admission. Likely secondary to Acute  diastolic heart failure exacerbation and left pleural effusion. Off BIPAP. Weaning oxygen.  Chest x ray effusion decreased in size.  On 2l oxygen.    2-Acute diastolic heart failure exacerbation, left side pleural effusion: -6  L -Weight 149-----151 -Treated initially with  IV Lasix 60 mg IV twice daily--- change to 40 mg IV BID on 11/26. -incentive spirometry.  -Repeated chest x ray with decreased in size pleural effusion.  -Appreciate cardiology evaluation.  -on oral lasix 40 mg daily since 11/28  3-Paroxysmal A. fib, RVR: Status post watchman procedure secondary to recurrent bleeds. Patient was restarted on anticoagulation, last admission.. Continue with Eliquis and aspirin.  Metoprolol increase to 100 mg BID.  Cardiology stopped digoxin and started Cardizem back.   Diabetes type 2: Continue with a sliding scale insulin On low  dose lantus. Increase lantus to 30 units.   Hyperlipidemia: Continue with the statins  Essential hypertension: Continue with lasix , cardizem./   Recent history of GI bleed Denies bleeding. Monitor hb.   Chronic meds;  Continue with  gabapentin, effexor.  On xanax at hs/  Multivitamins.   Hypomagnesemia; replete orally.   Estimated body mass index is 25.92 kg/m as calculated from the following:   Height as of this encounter: 5\' 4"  (1.626 m).   Weight as of this encounter: 68.5 kg.   DVT prophylaxis: Eliquis Code Status: Full code Family Communication: Care discussed with patient. And husband at bedside.  Disposition Plan:  Status is: Inpatient  Remains inpatient appropriate because:Hemodynamically unstable   Dispo: The patient is from: Home              Anticipated d/c is to: Home              Anticipated d/c date is: 3 days              Patient currently is not medically stable to d/c.        Consultants:   Cardiology   Procedures:   none  Antimicrobials:    Subjective: She is breathing much better.  Denies chest pain   Objective: Vitals:   07/26/20 0831 07/26/20 0953 07/26/20 1044 07/26/20 1104  BP: (!) 154/47 131/68 (!) 124/93 (!) 124/93  Pulse: 100 85  64  Resp:  20  13  Temp:  98.5 F (36.9 C)  98.4 F (36.9 C)  TempSrc:  Oral  Oral  SpO2:  97%  99%  Weight:  Height:        Intake/Output Summary (Last 24 hours) at 07/26/2020 1343 Last data filed at 07/26/2020 1144 Gross per 24 hour  Intake 298.85 ml  Output 950 ml  Net -651.15 ml   Filed Weights   07/23/20 1640 07/26/20 0507  Weight: 67.9 kg 68.5 kg    Examination:  General exam: NAD Respiratory system: CTA Cardiovascular system: S 1, S 2 RRR Gastrointestinal system: BS present, soft, nt Central nervous system: Alert Extremities: no edema   Data Reviewed: I have personally reviewed following labs and imaging studies  CBC: Recent Labs  Lab 07/22/20 1624  07/22/20 1728 07/23/20 0201 07/25/20 0710 07/26/20 0046  WBC 15.6*  --  11.5* 10.1 13.3*  NEUTROABS 13.2*  --  8.8*  --   --   HGB 9.3* 9.9* 9.5* 10.4* 10.1*  HCT 31.9* 29.0* 31.8* 34.4* 32.7*  MCV 94.1  --  92.4 88.9 87.4  PLT 474*  --  391 389 300   Basic Metabolic Panel: Recent Labs  Lab 07/22/20 1624 07/22/20 1624 07/22/20 1728 07/23/20 0201 07/24/20 0412 07/25/20 0056 07/25/20 0710 07/26/20 0046  NA 137   < > 139 137 136 136  --  133*  K 4.1   < > 3.9 4.0 3.7 3.8  --  3.3*  CL 99  --   --  96* 89* 87*  --  88*  CO2 26  --   --  27 35* 36*  --  36*  GLUCOSE 106*  --   --  213* 143* 217*  --  216*  BUN 16  --   --  13 12 17   --  21  CREATININE 0.95  --   --  0.93 0.83 1.26*  --  1.08*  CALCIUM 9.2  --   --  9.3 9.4 9.1  --  9.1  MG  --   --   --   --   --   --  1.4* 1.8   < > = values in this interval not displayed.   GFR: Estimated Creatinine Clearance: 40.1 mL/min (A) (by C-G formula based on SCr of 1.08 mg/dL (H)). Liver Function Tests: Recent Labs  Lab 07/22/20 1624  AST 19  ALT 21  ALKPHOS 86  BILITOT 0.7  PROT 6.8  ALBUMIN 3.6   No results for input(s): LIPASE, AMYLASE in the last 168 hours. No results for input(s): AMMONIA in the last 168 hours. Coagulation Profile: No results for input(s): INR, PROTIME in the last 168 hours. Cardiac Enzymes: No results for input(s): CKTOTAL, CKMB, CKMBINDEX, TROPONINI in the last 168 hours. BNP (last 3 results) No results for input(s): PROBNP in the last 8760 hours. HbA1C: No results for input(s): HGBA1C in the last 72 hours. CBG: Recent Labs  Lab 07/25/20 1114 07/25/20 1628 07/25/20 2126 07/26/20 0757 07/26/20 1102  GLUCAP 297* 263* 262* 150* 320*   Lipid Profile: No results for input(s): CHOL, HDL, LDLCALC, TRIG, CHOLHDL, LDLDIRECT in the last 72 hours. Thyroid Function Tests: No results for input(s): TSH, T4TOTAL, FREET4, T3FREE, THYROIDAB in the last 72 hours. Anemia Panel: No results for  input(s): VITAMINB12, FOLATE, FERRITIN, TIBC, IRON, RETICCTPCT in the last 72 hours. Sepsis Labs: No results for input(s): PROCALCITON, LATICACIDVEN in the last 168 hours.  Recent Results (from the past 240 hour(s))  Resp Panel by RT-PCR (Flu A&B, Covid) Nasopharyngeal Swab     Status: None   Collection Time: 07/22/20  4:24 PM   Specimen: Nasopharyngeal  Swab; Nasopharyngeal(NP) swabs in vial transport medium  Result Value Ref Range Status   SARS Coronavirus 2 by RT PCR NEGATIVE NEGATIVE Final    Comment: (NOTE) SARS-CoV-2 target nucleic acids are NOT DETECTED.  The SARS-CoV-2 RNA is generally detectable in upper respiratory specimens during the acute phase of infection. The lowest concentration of SARS-CoV-2 viral copies this assay can detect is 138 copies/mL. A negative result does not preclude SARS-Cov-2 infection and should not be used as the sole basis for treatment or other patient management decisions. A negative result may occur with  improper specimen collection/handling, submission of specimen other than nasopharyngeal swab, presence of viral mutation(s) within the areas targeted by this assay, and inadequate number of viral copies(<138 copies/mL). A negative result must be combined with clinical observations, patient history, and epidemiological information. The expected result is Negative.  Fact Sheet for Patients:  EntrepreneurPulse.com.au  Fact Sheet for Healthcare Providers:  IncredibleEmployment.be  This test is no t yet approved or cleared by the [place] FDA and  has been authorized for detection and/or diagnosis of SARS-CoV-2 by FDA under an Emergency Use Authorization (EUA). This EUA will remain  in effect (meaning this test can be used) for the duration of the COVID-19 declaration under Section 564(b)(1) of the Act, 21 U.S.C.section 360bbb-3(b)(1), unless the authorization is terminated  or revoked sooner.        Influenza A by PCR NEGATIVE NEGATIVE Final   Influenza B by PCR NEGATIVE NEGATIVE Final    Comment: (NOTE) The Xpert Xpress SARS-CoV-2/FLU/RSV plus assay is intended as an aid in the diagnosis of influenza from Nasopharyngeal swab specimens and should not be used as a sole basis for treatment. Nasal washings and aspirates are unacceptable for Xpert Xpress SARS-CoV-2/FLU/RSV testing.  Fact Sheet for Patients: EntrepreneurPulse.com.au  Fact Sheet for Healthcare Providers: IncredibleEmployment.be  This test is not yet approved or cleared by the [place] FDA and has been authorized for detection and/or diagnosis of SARS-CoV-2 by FDA under an Emergency Use Authorization (EUA). This EUA will remain in effect (meaning this test can be used) for the duration of the COVID-19 declaration under Section 564(b)(1) of the Act, 21 U.S.C. section 360bbb-3(b)(1), unless the authorization is terminated or revoked.  Performed at [facility], [place] [address] [place], [place] [address]          Radiology Studies: No results found.      Scheduled Meds: . ALPRAZolam  0.25 mg Oral QHS  . apixaban  5 mg Oral BID  . aspirin EC  81 mg Oral Daily  . diltiazem  30 mg Oral Q6H  . furosemide  40 mg Oral Daily  . gabapentin  600 mg Oral TID  . insulin aspart  0-9 Units Subcutaneous TID WC  . insulin glargine  30 Units Subcutaneous Daily  . magnesium oxide  200 mg Oral BID  . metoprolol tartrate  100 mg Oral BID  . multivitamins with iron  1 tablet Oral QPM  . pantoprazole  80 mg Oral Q1200  . sodium chloride flush  3 mL Intravenous Q12H  . venlafaxine  37.5 mg Oral BID   Continuous Infusions: . sodium chloride 10 mL/hr at 07/25/20 1600     LOS: 4 days    Time spent: 35 minutes    Kaydance Bowie A Bereket Gernert, MD [facility]   If 7PM-7AM, please contact night-coverage [email or web address]  07/26/2020, 1:43 PM

## 2020-07-26 NOTE — Care Management Important Message (Signed)
Important Message  Patient Details  Name: [name] MRN: [id number] Date of Birth: [birth year]   Medicare Important Message Given:  Yes     Shelda Altes 07/26/2020, 11:32 AM

## 2020-07-26 NOTE — Progress Notes (Signed)
Notified Dr. Tyrell Antonio of med changes made today given AVS already completed, recommended she call pt's pharmacy to cancel digoxin rx - further med changes may be forthcoming.

## 2020-07-26 NOTE — Progress Notes (Signed)
Occupational Therapy Evaluation Patient Details Name: [name] MRN: [id number] DOB: 07-14-1941 Today's Date: 07/26/2020    History of Present Illness 79 y.o. female admitted on 07/22/20 for SOB.  Pt found to have acute hypoxic respiratory failure requiring BiPAP on admission.  Dx with acute diastolic heart failure exacerbation and L pleural effusion.  PAF RVR.  Pt with significant PMH of recent GIB, essential HTN, DM2, PVD, Fe deficient anemia, CKD, B TKA.     Clinical Impression   PTA patient was living with her husband and was requiring at least supervision A for ADLs. Husband also provided assist with IADLs. Patient currently demonstrates deficits in standing balance, activity tolerance, strength, and safety awareness requiring Min guard to Min A with bed mobility, ADL transfers, and household mobility with and without use of AD. Patient would benefit from continued acute OT services to maximize safety and independence with self-care tasks in prep for safe d/c home with recommendation for initial 24hr supervision/assist and HHOT.     Follow Up Recommendations  Home health OT;Supervision/Assistance - 24 hour    Equipment Recommendations  None recommended by OT    Recommendations for Other Services       Precautions / Restrictions Precautions Precautions: Fall Precaution Comments: multiple due to neuropathy Restrictions Weight Bearing Restrictions: No      Mobility Bed Mobility Overal bed mobility: Needs Assistance Bed Mobility: Supine to Sit     Supine to sit: Min assist;HOB elevated     General bed mobility comments: Min A to bring trunk upright. Patient with difficulty using RUE 2/2 pain.     Transfers Overall transfer level: Needs assistance Equipment used: Rolling walker (2 wheeled) Transfers: Sit to/from [facility] Sit to Stand: Min guard Stand pivot transfers: Min guard       General transfer comment: Min guard for sit to stand from  EOB and for SPT to recliner with use of RW.     Balance Overall balance assessment: Needs assistance Sitting-balance support: Feet supported;Bilateral upper extremity supported Sitting balance-Leahy Scale: Fair     Standing balance support: Single extremity supported Standing balance-Leahy Scale: Poor Standing balance comment: Reliance on BUE on RW                           ADL either performed or assessed with clinical judgement   ADL                                               Vision Baseline Vision/History: Wears glasses Wears Glasses: At all times Patient Visual Report: No change from baseline Vision Assessment?: No apparent visual deficits     Perception     Praxis      Pertinent Vitals/Pain Pain Assessment: 0-10 Pain Score: 7  Pain Location: R shoulder Pain Descriptors / Indicators: Aching;Sore Pain Intervention(s): Monitored during session;Premedicated before session     Hand Dominance Right   Extremity/Trunk Assessment Upper Extremity Assessment Upper Extremity Assessment: RUE deficits/detail RUE Deficits / Details: pt reports sleeping wrong on her R shoulder and is now unable to raise it above 90 degrees.    Lower Extremity Assessment Lower Extremity Assessment: Generalized weakness   Cervical / Trunk Assessment Cervical / Trunk Assessment: Normal   Communication Communication Communication: No difficulties   Cognition Arousal/Alertness: Awake/alert Behavior During Therapy: [facility]  for tasks assessed/performed Overall Cognitive Status: Within Functional Limits for tasks assessed                                     General Comments  SpO2 >98% on 2L O2 via [place]    Exercises General Exercises - Upper Extremity Elbow Flexion:  (unable to do R as elbow has been immobilized due to IV )   Shoulder Instructions      Home Living Family/patient expects to be discharged to:: Private residence Living  Arrangements: Spouse/significant other (married 49 years) Available Help at Discharge: Family Type of Home: House Home Access: Stairs to enter Technical brewer of Steps: 3 Entrance Stairs-Rails: Right;Left Home Layout: One level     Bathroom Shower/Tub: Occupational psychologist: Handicapped height     Home Equipment: Environmental consultant - 2 wheels;Shower seat;Bedside commode;Grab bars - toilet;Grab bars - tub/shower;Hand held shower head   Additional Comments: home O2 2L via [place]      Prior Functioning/Environment Level of Independence: Needs assistance  Gait / Transfers Assistance Needed: uses RW when needed. Supervision A for ADL transfers.  ADL's / Homemaking Assistance Needed: Spouse provides supervision A for safety    Comments: active with home therapy        OT Problem List: Decreased strength;Decreased range of motion;Decreased activity tolerance;Impaired balance (sitting and/or standing);Decreased safety awareness;Cardiopulmonary status limiting activity;Pain;Impaired UE functional use      OT Treatment/Interventions: Self-care/ADL training;Therapeutic exercise;Energy conservation;DME and/or AE instruction;Therapeutic activities;Patient/family education;Balance training    OT Goals(Current goals can be found in the care plan section) Acute Rehab OT Goals Patient Stated Goal: to get back home and get her strength back OT Goal Formulation: With patient Time For Goal Achievement: 08/09/20 Potential to Achieve Goals: Good ADL Goals Pt Will Perform Upper Body Dressing: sitting;with set-up Pt Will Perform Lower Body Dressing: with supervision;sit to/from stand Pt Will Transfer to Toilet: with supervision;ambulating Pt Will Perform Toileting - Clothing Manipulation and hygiene: with supervision;sit to/from stand Additional ADL Goal #1: Patient will identify and demonstrate use of 3 energy conservation techniques in prep for BADLs.  OT Frequency: Min 2X/week   Barriers  to D/C:            Co-evaluation              AM-PAC OT "6 Clicks" Daily Activity     Outcome Measure Help from another person eating meals?: None Help from another person taking care of personal grooming?: A Little Help from another person toileting, which includes using toliet, bedpan, or urinal?: A Little Help from another person bathing (including washing, rinsing, drying)?: A Little Help from another person to put on and taking off regular upper body clothing?: A Little Help from another person to put on and taking off regular lower body clothing?: A Little 6 Click Score: 19   End of Session Equipment Utilized During Treatment: Gait belt;Rolling walker  Activity Tolerance: Patient tolerated treatment well Patient left: in chair;with call bell/phone within reach;with chair alarm set;with family/visitor present  OT Visit Diagnosis: Unsteadiness on feet (R26.81);Muscle weakness (generalized) (M62.81);History of falling (Z91.81);Pain Pain - Right/Left: Right Pain - part of body: Shoulder                Time: 6546-5035 OT Time Calculation (min): 25 min Charges:  OT General Charges $OT Visit: 1 Visit OT Evaluation $OT Eval Moderate Complexity: 1 Mod  OT Treatments $Therapeutic Activity: 8-22 mins  Mcgregor Tinnon H. OTR/L Supplemental OT, Department of rehab services [phone]  Kashis Penley R H. 07/26/2020, 9:33 AM

## 2020-07-26 NOTE — Progress Notes (Addendum)
Progress Note  Patient Name: [name] Date of Encounter: 07/26/2020  Primary Cardiologist: Candee Furbish, MD  Subjective   Patient reports she feels tremendously better. No SOB, stronger. Husband somewhat concerned she has not yet walked with PT, only OOB.  Inpatient Medications    Scheduled Meds:  ALPRAZolam  0.25 mg Oral QHS   apixaban  5 mg Oral BID   digoxin  0.125 mg Oral Daily   furosemide  40 mg Oral Daily   gabapentin  600 mg Oral TID   insulin aspart  0-9 Units Subcutaneous TID WC   insulin glargine  30 Units Subcutaneous Daily   magnesium oxide  200 mg Oral BID   metoprolol tartrate  100 mg Oral BID   multivitamins with iron  1 tablet Oral QPM   pantoprazole  80 mg Oral Q1200   potassium chloride  40 mEq Oral Once   potassium chloride  40 mEq Oral Once   sodium chloride flush  3 mL Intravenous Q12H   venlafaxine  37.5 mg Oral BID   Continuous Infusions:  sodium chloride 10 mL/hr at 07/25/20 1600   PRN Meds: sodium chloride, acetaminophen, albuterol, docusate sodium, ondansetron (ZOFRAN) IV, promethazine, sodium chloride flush   Vital Signs    Vitals:   07/25/20 1624 07/25/20 2020 07/25/20 2340 07/26/20 0507  BP: 129/63 107/78 (!) 115/51 (!) 124/56  Pulse: 64 82 75 83  Resp: 20 19 18 20   Temp: 97.9 F (36.6 C) 99.6 F (37.6 C) 99.1 F (37.3 C) 98.7 F (37.1 C)  TempSrc: Oral Oral Oral Oral  SpO2: 100% 98% 98% 97%  Weight:    68.5 kg  Height:        Intake/Output Summary (Last 24 hours) at 07/26/2020 3846 Last data filed at 07/25/2020 1625 Gross per 24 hour  Intake 535.85 ml  Output 950 ml  Net -414.15 ml   Last 3 Weights 07/26/2020 07/23/2020 07/14/2020  Weight (lbs) 151 lb 0.2 oz 149 lb 11.1 oz 157 lb 6.5 oz  Weight (kg) 68.5 kg 67.9 kg 71.4 kg     Telemetry    Atrial fib rates 90s-low 100s - Personally Reviewed  Physical Exam   GEN: No acute distress.  HEENT: Normocephalic, atraumatic, sclera non-icteric. Neck: No JVD or  bruits. Cardiac: Irregularly irregular, rate borderline, no murmurs, rubs, or gallops.  Radials/DP/PT 1+ and equal bilaterally.  Respiratory: Clear to auscultation bilaterally. Breathing is unlabored. GI: Soft, nontender, non-distended, BS +x 4. MS: no deformity. Extremities: No clubbing or cyanosis. No edema. Distal pedal pulses are 2+ and equal bilaterally. Neuro:  AAOx3. Follows commands. Psych:  Responds to questions appropriately with a normal affect.  Labs    High Sensitivity Troponin:   Recent Labs  Lab 07/12/20 1626 07/22/20 1624 07/22/20 1819  TROPONINIHS 4 11 11       Cardiac EnzymesNo results for input(s): TROPONINI in the last 168 hours. No results for input(s): TROPIPOC in the last 168 hours.   Chemistry Recent Labs  Lab 07/22/20 1624 07/22/20 1728 07/24/20 0412 07/25/20 0056 07/26/20 0046  NA 137   < > 136 136 133*  K 4.1   < > 3.7 3.8 3.3*  CL 99   < > 89* 87* 88*  CO2 26   < > 35* 36* 36*  GLUCOSE 106*   < > 143* 217* 216*  BUN 16   < > 12 17 21   CREATININE 0.95   < > 0.83 1.26* 1.08*  CALCIUM 9.2   < >  9.4 9.1 9.1  PROT 6.8  --   --   --   --   ALBUMIN 3.6  --   --   --   --   AST 19  --   --   --   --   ALT 21  --   --   --   --   ALKPHOS 86  --   --   --   --   BILITOT 0.7  --   --   --   --   GFRNONAA >60   < > >60 43* 52*  ANIONGAP 12   < > 12 13 9    < > = values in this interval not displayed.     Hematology Recent Labs  Lab 07/23/20 0201 07/25/20 0710 07/26/20 0046  WBC 11.5* 10.1 13.3*  RBC 3.44* 3.87 3.74*  HGB 9.5* 10.4* 10.1*  HCT 31.8* 34.4* 32.7*  MCV 92.4 88.9 87.4  MCH 27.6 26.9 27.0  MCHC 29.9* 30.2 30.9  RDW 14.8 14.2 14.0  PLT 391 389 396    BNP Recent Labs  Lab 07/22/20 1624  BNP 347.4*     DDimer No results for input(s): DDIMER in the last 168 hours.   Radiology    DG Chest 2 View  Result Date: 07/24/2020 CLINICAL DATA:  Pleural effusion per order. EXAM: CHEST - 2 VIEW COMPARISON:  July 22, 2020.  FINDINGS: Similar cardiomediastinal silhouette. Aortic atherosclerosis. Small left pleural effusion. Overlying left basilar retrocardiac opacity. No acute osseous abnormality. Thoracic vertebral body vertebral augmentation. Partially imaged thoracic spinal cord stimulator. Osteopenia. IMPRESSION: 1. Small left pleural effusion, likely decreased in size. 2. Overlying left basilar retrocardiac opacity, favor atelectasis. Electronically Signed   By: Margaretha Sheffield MD   On: 07/24/2020 11:01    Cardiac Studies   2D Echo 07/13/20   1. Left ventricular ejection fraction, by estimation, is 55 to 60%. The  left ventricle has normal function. The left ventricle has no regional  wall motion abnormalities. There is mild asymmetric left ventricular  hypertrophy of the posterior-lateral  segment. Left ventricular diastolic parameters are indeterminate. Elevated  left ventricular end-diastolic pressure.   2. Right ventricular systolic function is mildly reduced. The right  ventricular size is mildly enlarged. There is normal pulmonary artery  systolic pressure. The estimated right ventricular systolic pressure is  09.9 mmHg.   3. Left atrial size was moderately dilated.   4. Right atrial size was severely dilated.   5. The mitral valve is abnormal. Trivial mitral valve regurgitation.  Moderate mitral annular calcification.   6. Tricuspid valve regurgitation is mild to moderate.   7. The aortic valve is grossly normal. Aortic valve regurgitation is not  visualized. No aortic stenosis is present.   8. The inferior vena cava is normal in size with greater than 50%  respiratory variability, suggesting right atrial pressure of 3 mmHg.   9. S/p 27 mm Watchman FLX 07/01/20, which is not well visualized or  assessed on this transthoracic echo.   Patient Profile     79 y.o. female with history of paroxysmal atrial fibrillation and recurrent GIB s/p Watchman implantation 07/01/20, CKD stage III, chronic  diastolic CHF, DM, GERD, hiatal hernia, iron deficiency anemia. S/p recent Watchman as above, then readmission 11/15-11/18/21 with ABL anemia and presumed UGI bleed requiring transfusion with EGD showing resolving moderately severe desquamating reflux esophagitis with gastric antral vascular ectasia s/p APC. During that admission she had PAF with RVR and  acute diastolic CHF requiring diuresis. She was felt to require home O2 at discharge. She was readmitted 07/22/2020 with severe dyspnea, acute hypoxic respiratory failure requiring BiPAP, with left sided pleural effusion and acute on chronic diastolic CHF.  Assessment & Plan    1. Acute hypoxic respiratory failure likely due in part to acute on chronic diastolic CHF - felt triggered by recent volume bolus with blood product resuscitation last admission - f/u CXR felt retrocardiac opacity was more likely atx than PNA  s/p IV Lasix, now on oral Lasix - weights not logged consistently but she is -6.1L  2. Paroxysmal atrial fibrillation s/p recent Watchman - was loaded on digoxin and diltiazem on admission, now on digoxin and metoprolol - IV diltiazem stopped yesterday with rates borderline 90s-low 100s at times but generalized trend last 12 hours with average HR around 92bpm - per d/w Dr. Irish Lack, will stop digoxin (less ideal in elderly pt with CKD) and add low dose diltiazem 30mg  q6hr to see how she does  - add back low dose ASA per discussion with Dr. Quentin Ore - would suggest ambulating and assessing O2/HR needs prior to discharge -likely need to follow today on diltiazem  3. Anemia due to GIB - Hgb 9.3 on admission -> 9.9 -> 9.5-> 10.4 -> 10.1 - will need to trend Hgb closely as outpatient  4. Hyponatremia, mild leukocytosis (variable for patient) - per primary team, will need OP f/u of this as well  5. Hypokalemia/hypomagnesemia - getting KCL x 2 doses today and written to go home on KCl 74meq daily and MagOx 200mg  BID - f/u mag level  today as well and replete further if needed  6. Suspected CKD stage II - OP Cr variable from 0.9-1.2 range - stable today  Reached out to EP team regarding optimal timing of f/u - Dr. Quentin Ore would suggest afib clinic f/u in 10 days with [facility] with labs which can be scheduled when discharge date is finalized.   For questions or updates, please contact [place] Please consult [email or web address] for contact info under Cardiology/STEMI.  Signed, Charlie Pitter, PA-C 07/26/2020, 8:22 AM    I have examined the patient and reviewed assessment and plan and discussed with patient.  Agree with above as stated.  Will stop Digoxin.  Add low dose Diltiazem.  Follow BP and HR.  WIll have to see HR as she gets out of bed.    Larae Grooms

## 2020-07-27 DIAGNOSIS — N182 Chronic kidney disease, stage 2 (mild): Secondary | ICD-10-CM

## 2020-07-27 DIAGNOSIS — K922 Gastrointestinal hemorrhage, unspecified: Secondary | ICD-10-CM | POA: Diagnosis not present

## 2020-07-27 DIAGNOSIS — I480 Paroxysmal atrial fibrillation: Secondary | ICD-10-CM | POA: Diagnosis not present

## 2020-07-27 LAB — CBC
HCT: 33.4 % — ABNORMAL LOW (ref 36.0–46.0)
Hemoglobin: 10 g/dL — ABNORMAL LOW (ref 12.0–15.0)
MCH: 27 pg (ref 26.0–34.0)
MCHC: 29.9 g/dL — ABNORMAL LOW (ref 30.0–36.0)
MCV: 90 fL (ref 80.0–100.0)
Platelets: 366 10*3/uL (ref 150–400)
RBC: 3.71 MIL/uL — ABNORMAL LOW (ref 3.87–5.11)
RDW: 14.1 % (ref 11.5–15.5)
WBC: 9.1 10*3/uL (ref 4.0–10.5)
nRBC: 0 % (ref 0.0–0.2)

## 2020-07-27 LAB — BASIC METABOLIC PANEL
Anion gap: 11 (ref 5–15)
BUN: 26 mg/dL — ABNORMAL HIGH (ref 8–23)
CO2: 35 mmol/L — ABNORMAL HIGH (ref 22–32)
Calcium: 9.4 mg/dL (ref 8.9–10.3)
Chloride: 91 mmol/L — ABNORMAL LOW (ref 98–111)
Creatinine, Ser: 0.92 mg/dL (ref 0.44–1.00)
GFR, Estimated: >60 mL/min (ref >60)
Glucose, Bld: 205 mg/dL — ABNORMAL HIGH (ref 70–99)
Potassium: 4.6 mmol/L (ref 3.5–5.1)
Sodium: 137 mmol/L (ref 135–145)

## 2020-07-27 LAB — MAGNESIUM: Magnesium: 1.8 mg/dL (ref 1.7–2.4)

## 2020-07-27 LAB — GLUCOSE, CAPILLARY: Glucose-Capillary: 174 mg/dL — ABNORMAL HIGH (ref 70–99)

## 2020-07-27 NOTE — Progress Notes (Signed)
Inpatient Diabetes Program Recommendations  AACE/ADA: New Consensus Statement on Inpatient Glycemic Control (2015)  Target Ranges:  Prepandial:   less than 140 mg/dL      Peak postprandial:   less than 180 mg/dL (1-2 hours)      Critically ill patients:  140 - 180 mg/dL   Results for AVANTIKA, SHERE (MRN [id number]) as of 07/27/2020 10:01  Ref. Range 07/26/2020 07:57 07/26/2020 11:02 07/26/2020 16:08 07/26/2020 21:13  Glucose-Capillary Latest Ref Range: 70 - 99 mg/dL 150 (H)  1 unit NOVOLOG  320 (H)  7 units NOVOLOG  168 (H)  2 units NOVOLOG  286 (H)  30 units LANTUS @10pm    Results for XANA, BRADT (MRN [id number]) as of 07/27/2020 10:01  Ref. Range 07/27/2020 07:41  Glucose-Capillary Latest Ref Range: 70 - 99 mg/dL 174 (H)  2 units NOVOLOG     Home DM Meds: Lantus 30 units QHS       Novolog 0-16 units TID per SSI   Current Orders: Lantus 30 units Daily      Novolog Sensitive Correction Scale/ SSI (0-9 units) TID AC    MD- If patient not discharged home today, please consider adding Novolog meal coverage to current hospital insulin regimen: Novolog 4 units TID with meals  (Please add the following Hold Parameters: Hold if pt eats <50% of meal, Hold if pt NPO)    --Will follow patient during hospitalization--  Wyn Quaker RN, MSN, CDE Diabetes Coordinator Inpatient Glycemic Control Team Team Pager: [phone] (8a-5p)

## 2020-07-27 NOTE — TOC Transition Note (Signed)
Transition of Care (TOC) - CM/SW Discharge Note Marvetta Gibbons RN, BSN Transitions of Care Unit 4E- RN Case Manager See Treatment Team for direct phone # Cross coverage for [place]    Patient Details  Name: [name] MRN: [id number] Date of Birth: Jul 13, 1941  Transition of Care [facility]) CM/SW Contact:  Dawayne Patricia, RN Phone Number: 07/27/2020, 1:21 PM   Clinical Narrative:    Pt stable for transition home today, active with [facility], resumption orders placed for HHPT/OT- [place] to f/u post discharge to [facility] services.    Final next level of care: [place] Barriers to Discharge: No Barriers Identified   Patient Goals and CMS Choice Patient states their goals for this hospitalization and ongoing recovery are:: to go home CMS Medicare.gov Compare Post Acute Care list provided to:: Patient Choice offered to / list presented to : Patient  Discharge Placement               Home with [facility]        Discharge Plan and Services   Discharge Planning Services: CM Consult Post Acute Care Choice: Home Health                    HH Arranged: PT, OT [facility] Agency: [place] Date [place]: 07/25/20 Time HH Agency Contacted: [phone] Representative spoke with at [place]: [facility] ([place]) Interventions     Readmission Risk Interventions No flowsheet data found.

## 2020-07-27 NOTE — Progress Notes (Addendum)
Progress Note  Patient Name: [name] Date of Encounter: 07/27/2020  [facility] Cardiologist: Candee Furbish, MD   Subjective   Feeling better this morning. Breathing is much improved. Stable on 2L [place].   Inpatient Medications    Scheduled Meds: . ALPRAZolam  0.25 mg Oral QHS  . apixaban  5 mg Oral BID  . aspirin EC  81 mg Oral Daily  . diltiazem  30 mg Oral Q6H  . furosemide  40 mg Oral Daily  . gabapentin  600 mg Oral TID  . insulin aspart  0-9 Units Subcutaneous TID WC  . insulin glargine  30 Units Subcutaneous Daily  . magnesium oxide  200 mg Oral BID  . metoprolol tartrate  100 mg Oral BID  . multivitamins with iron  1 tablet Oral QPM  . pantoprazole  80 mg Oral Q1200  . sodium chloride flush  3 mL Intravenous Q12H  . venlafaxine  37.5 mg Oral BID   Continuous Infusions: . sodium chloride 10 mL/hr at 07/25/20 1600   PRN Meds: sodium chloride, acetaminophen, albuterol, docusate sodium, ondansetron (ZOFRAN) IV, promethazine, sodium chloride flush   Vital Signs    Vitals:   07/26/20 2113 07/27/20 0024 07/27/20 0437 07/27/20 0818  BP: (!) 140/48 (!) 112/52 (!) 121/47 (!) 134/58  Pulse: 82 71 63 69  Resp: 17 20 18    Temp: 99.1 F (37.3 C) 97.6 F (36.4 C) 97.7 F (36.5 C)   TempSrc: Oral Oral Oral   SpO2: 99% 100% 99%   Weight:   70.3 kg   Height:        Intake/Output Summary (Last 24 hours) at 07/27/2020 0915 Last data filed at 07/26/2020 2000 Gross per 24 hour  Intake 363 ml  Output --  Net 363 ml   Last 3 Weights 07/27/2020 07/26/2020 07/23/2020  Weight (lbs) 154 lb 15.7 oz 151 lb 0.2 oz 149 lb 11.1 oz  Weight (kg) 70.3 kg 68.5 kg 67.9 kg      Telemetry    Afib rates in the 50-60 range, dips into the 40s at times - Personally Reviewed  ECG    No new tracing  Physical Exam  Pleasant older female, sitting up in bed GEN: No acute distress.   Neck: No JVD Cardiac: RRR, no murmurs, rubs, or gallops.  Respiratory: Diminished in  bases GI: Soft, nontender, non-distended  MS: No edema; No deformity. Neuro:  Nonfocal  Psych: Normal affect   Labs    High Sensitivity Troponin:   Recent Labs  Lab 07/12/20 1626 07/22/20 1624 07/22/20 1819  TROPONINIHS 4 11 11       Chemistry Recent Labs  Lab 07/22/20 1624 07/22/20 1728 07/25/20 0056 07/26/20 0046 07/27/20 0241  NA 137   < > 136 133* 137  K 4.1   < > 3.8 3.3* 4.6  CL 99   < > 87* 88* 91*  CO2 26   < > 36* 36* 35*  GLUCOSE 106*   < > 217* 216* 205*  BUN 16   < > 17 21 26*  CREATININE 0.95   < > 1.26* 1.08* 0.92  CALCIUM 9.2   < > 9.1 9.1 9.4  PROT 6.8  --   --   --   --   ALBUMIN 3.6  --   --   --   --   AST 19  --   --   --   --   ALT 21  --   --   --   --  ALKPHOS 86  --   --   --   --   BILITOT 0.7  --   --   --   --   GFRNONAA >60   < > 43* 52* >60  ANIONGAP 12   < > 13 9 11    < > = values in this interval not displayed.     Hematology Recent Labs  Lab 07/25/20 0710 07/26/20 0046 07/27/20 0241  WBC 10.1 13.3* 9.1  RBC 3.87 3.74* 3.71*  HGB 10.4* 10.1* 10.0*  HCT 34.4* 32.7* 33.4*  MCV 88.9 87.4 90.0  MCH 26.9 27.0 27.0  MCHC 30.2 30.9 29.9*  RDW 14.2 14.0 14.1  PLT 389 396 366    BNP Recent Labs  Lab 07/22/20 1624  BNP 347.4*     DDimer No results for input(s): DDIMER in the last 168 hours.   Radiology    No results found.  Cardiac Studies   2D Echo 07/13/20  1. Left ventricular ejection fraction, by estimation, is 55 to 60%. The  left ventricle has normal function. The left ventricle has no regional  wall motion abnormalities. There is mild asymmetric left ventricular  hypertrophy of the posterior-lateral  segment. Left ventricular diastolic parameters are indeterminate. Elevated  left ventricular end-diastolic pressure.  2. Right ventricular systolic function is mildly reduced. The right  ventricular size is mildly enlarged. There is normal pulmonary artery  systolic pressure. The estimated right  ventricular systolic pressure is  70.2 mmHg.  3. Left atrial size was moderately dilated.  4. Right atrial size was severely dilated.  5. The mitral valve is abnormal. Trivial mitral valve regurgitation.  Moderate mitral annular calcification.  6. Tricuspid valve regurgitation is mild to moderate.  7. The aortic valve is grossly normal. Aortic valve regurgitation is not  visualized. No aortic stenosis is present.  8. The inferior vena cava is normal in size with greater than 50%  respiratory variability, suggesting right atrial pressure of 3 mmHg.  9. S/p 27 mm Watchman FLX 07/01/20, which is not well visualized or  assessed on this transthoracic echo.   Patient Profile     79 y.o. female with history of paroxysmal atrial fibrillation and recurrent GIB s/p Watchman implantation 07/01/20, CKD stage III, chronic diastolic CHF, DM, GERD, hiatal hernia, iron deficiency anemia. S/p recent Watchman as above, then readmission 11/15-11/18/21 with ABL anemia and presumed UGI bleed requiring transfusion with EGD showing resolving moderately severe desquamating reflux esophagitis with gastric antral vascular ectasia s/p APC. During that admission she had PAF with RVR and acute diastolic CHF requiring diuresis. She was felt to require home O2 at discharge. She was readmitted 07/22/2020 with severe dyspnea, acute hypoxic respiratory failure requiring BiPAP, with left sided pleural effusion and acute on chronic diastolic CHF.  Assessment & Plan    1. Acute hypoxic respiratory failure likely due in part to acute on chronic diastolic CHF: felt triggered by recent volume bolus with blood product resuscitation last admission. CXR felt retrocardiac opacity was more likely atx than PNA. Has been diuresed with IV lasix. Net -5.7L. Remains on [place] @2L . Has not been out of bed much, but up to the chair.   2. Paroxysmal atrial fibrillation s/p recent Watchman: was loaded on digoxin and diltiazem on admission. Dig  was stopped yesterday. Transitioned to Dilt 30mg  q6hr along with metoprolol 100mg  BID. HR now in the 50 ranges, will stop Dilt today. -- continue on Eliquis, with ASA with recent Watchman. Hgb remains stable  3. Anemia due to GIB: Hgb 9.3 on admission -> 9.9 -> 9.5-> 10.4 -> 10.1  4. Hyponatremia, mild leukocytosis (variable for patient): per primary team, will need OP f/u of this as well  5. Hypokalemia/hypomagnesemia: resolved on morning labs  6. Suspected CKD stage II: stable this morning  [facility] will sign off.   Medication Recommendations:  Stopping Dilt today Other recommendations (labs, testing, etc):  none Follow up as an outpatient:  Will arrange outpatient TOC with APP  For questions or updates, please contact [place] Please consult [email or web address] for contact info under        Signed, Reino Bellis, NP  07/27/2020, 9:15 AM    I have examined the patient and reviewed assessment and plan and discussed with patient.  Agree with above as stated.  Had some bradycardia with low dose dilt.  Now off digoxin.  Will continue metoprolol for rate control.  Watchman in place with appropriate temporary anticoagulation.  GI has evaluated as well.  If she feels ok walking, ok to discharge from a cardiac standopint.   Larae Grooms

## 2020-07-27 NOTE — Plan of Care (Signed)
  Problem: Elimination: Goal: Will not experience complications related to urinary retention Outcome: Adequate for Discharge   Problem: Education: Goal: Knowledge of General Education information will improve Description: Including pain rating scale, medication(s)/side effects and non-pharmacologic comfort measures Outcome: Adequate for Discharge   Problem: Health Behavior/Discharge Planning: Goal: Ability to manage health-related needs will improve Outcome: Adequate for Discharge   Problem: Clinical Measurements: Goal: Ability to maintain clinical measurements within normal limits will improve Outcome: Adequate for Discharge Goal: Will remain free from infection Outcome: Adequate for Discharge Goal: Diagnostic test results will improve Outcome: Adequate for Discharge Goal: Respiratory complications will improve Outcome: Adequate for Discharge Goal: Cardiovascular complication will be avoided Outcome: Adequate for Discharge   Problem: Activity: Goal: Risk for activity intolerance will decrease Outcome: Adequate for Discharge   Problem: Nutrition: Goal: Adequate nutrition will be maintained Outcome: Adequate for Discharge   Problem: Coping: Goal: Level of anxiety will decrease Outcome: Adequate for Discharge   Problem: Elimination: Goal: Will not experience complications related to bowel motility Outcome: Adequate for Discharge Goal: Will not experience complications related to urinary retention Outcome: Adequate for Discharge   Problem: Pain Managment: Goal: General experience of comfort will improve Outcome: Adequate for Discharge   Problem: Safety: Goal: Ability to remain free from injury will improve Outcome: Adequate for Discharge   Problem: Skin Integrity: Goal: Risk for impaired skin integrity will decrease Outcome: Adequate for Discharge

## 2020-07-27 NOTE — Progress Notes (Signed)
SATURATION QUALIFICATIONS: (This note is used to comply with regulatory documentation for home oxygen)  Patient Saturations on Room Air at Rest = 98%  Patient Saturations on Room Air while Ambulating = 89%  Patient Saturations on 2 Liters of oxygen while Ambulating = 95%  Please briefly explain why patient needs home oxygen: Patient will benefit from home supplemental oxygen due to desaturation to 89% with short ambulation distance.

## 2020-07-27 NOTE — Discharge Summary (Signed)
Physician Discharge Summary  [name] CHE:[id number] DOB: July 13, 1941 DOA: 07/22/2020  PCP: Leighton Ruff, MD  Admit date: 07/22/2020 Discharge date: 07/27/2020  Admitted From: Home  Disposition:  Home   Recommendations for Outpatient Follow-up:  1. Follow up with PCP in 1-2 weeks 2. Please obtain BMP/CBC in one week 3. Needs to monitor weight daily 4. Needs to follow up with cardiology for further care of A fib.   Home Health: [facility]  Discharge Condition: Stable.  CODE STATUS; Full code Diet recommendation: Heart Healthy / Carb Modified   Brief/Interim Summary: 79 year old with past medical history significant for recent GI bleed status post admission and discharge November 18, paroxysmal A. fib, chronic kidney disease a stage III, GERD remote history of asthma, diabetes recent watchman procedure who was discharged and now presents with worsening shortness of breath.  Patient was hypoxic on arrival requiring BiPAP.  Patient has been admitted for acute heart failure exacerbation.  Evaluation in the ED AVH pH 7.4 PCO2 49 PO2 121, chest x-ray showed mild CHF, fluid overload and moderate size left pleural effusion.  White count 15.  Patient was admitted with acute on chronic diastolic heart failure exacerbation, developed A. fib RVR.  She was treated with IV Laxis, initially on IV Cardizem drip and subsequently transition to oral but heart rate decreased so Cardizem was stopped.  Repeated chest x-ray showed decreased left side pleural effusion 1-Acute Hypoxic Respiratory failure patient required BiPAP on admission. Likely secondary to Acute  diastolic heart failure exacerbation and left pleural effusion. Off BIPAP. Weaning oxygen.  Chest x ray effusion decreased in size.  On 2l oxygen.    2-Acute diastolic heart failure exacerbation, left side pleural effusion: -6  L -Weight 149-----151---147 -Treated initially with  IV Lasix 60 mg IV twice daily--- change to 40 mg  IV BID on 11/26. -incentive spirometry.  -Repeated chest x ray with decreased in size pleural effusion.  -Appreciate cardiology evaluation.  -on oral lasix 40 mg daily since 11/28  3-Paroxysmal A. fib, RVR: Status post watchman procedure secondary to recurrent bleeds. Patient was restarted on anticoagulation, last admission.. Continue with Eliquis and aspirin.  Metoprolol increased  to 100 mg BID.  Cardiology stopped digoxin and started Cardizem back.   Heart rate decreased on oral Cardizem.  Cardizem has been discontinued by cardiologist. I called pharmacy and informed them that patient is not supposed to be on digoxin.  Cancel prescription for digoxin  Diabetes type 2: Continue with a sliding scale insulin Continue with lantus to 30 units.   Hyperlipidemia: Continue with the statins.  Essential hypertension: Continue with lasix , cardizem./   Recent history of GI bleed Denies bleeding. Monitor hb.  Hb  stable  Chronic meds;  Continue with  gabapentin, effexor.  On xanax at hs/  Multivitamins.   Hypomagnesemia; replete orally.   Provided prescription for magnesium supplement  Discharge Diagnoses:  Principal Problem:   Acute on chronic respiratory failure with hypoxemia (HCC) Active Problems:   HTN (hypertension)   Diabetes mellitus (HCC)   Hypercholesterolemia   Paroxysmal atrial fibrillation (HCC)   Pleural effusion    Discharge Instructions  Discharge Instructions    Diet - low sodium heart healthy   Complete by: As directed    Diet - low sodium heart healthy   Complete by: As directed    Increase activity slowly   Complete by: As directed    Increase activity slowly   Complete by: As directed  Allergies as of 07/27/2020      Reactions   Vasotec Shortness Of Breath, Other (See Comments)   Wheezing, also   Atorvastatin Other (See Comments)   Leg cramps   Codeine Nausea And Vomiting   Cymbalta [duloxetine Hcl] Itching   Lansoprazole  Itching, Swelling, Rash, Other (See Comments)   Redness, swelling of mouth. During 07/12/20-07/15/20 pt tolerated IV protonix infusion with no reactions.   Morphine And Related Itching   Sulfate Itching   Tramadol Itching   Adhesive [tape] Rash   Scopolamine Rash      Medication List    STOP taking these medications   amLODipine 5 MG tablet Commonly known as: NORVASC     TAKE these medications   acetaminophen 500 MG tablet Commonly known as: TYLENOL Take 1,000 mg by mouth every 8 (eight) hours as needed for mild pain, moderate pain or headache.   albuterol 108 (90 Base) MCG/ACT inhaler Commonly known as: ProAir HFA Inhale 2 puffs into the lungs every 4 (four) hours as needed for wheezing or shortness of breath.   ALPRAZolam 0.25 MG tablet Commonly known as: XANAX Take 0.25 mg by mouth at bedtime.   aspirin EC 81 MG tablet Take 81 mg by mouth daily. Swallow whole.   diphenhydrAMINE 25 MG tablet Commonly known as: BENADRYL Take 25 mg by mouth daily as needed for allergies.   docusate sodium 100 MG capsule Commonly known as: COLACE Take 100 mg by mouth 2 (two) times daily as needed for mild constipation.   Droplet Pen Needles 32G X 4 MM Misc Generic drug: Insulin Pen Needle   Eliquis 5 MG Tabs tablet Generic drug: apixaban TAKE 1 TABLET TWICE DAILY What changed: how much to take   esomeprazole 40 MG capsule Commonly known as: NEXIUM Take 40 mg by mouth in the morning and at bedtime.   fluticasone 50 MCG/ACT nasal spray Commonly known as: FLONASE Place 1 spray into both nostrils daily as needed for allergies.   FreeStyle Libre 14 Day Sensor Misc Inject 1 patch into the skin every 14 (fourteen) days.   furosemide 40 MG tablet Commonly known as: LASIX Take 1 tablet (40 mg total) by mouth daily.   Fusion Plus Caps Take 1 capsule by mouth every evening.   gabapentin 300 MG capsule Commonly known as: NEURONTIN Take 600 mg by mouth in the morning, at noon,  and at bedtime.   Lantus SoloStar 100 UNIT/ML Solostar Pen Generic drug: insulin glargine Inject 30 Units into the skin at bedtime.   Livalo 1 MG Tabs Generic drug: Pitavastatin Calcium Take 1 mg by mouth at bedtime.   magnesium oxide 400 (241.3 Mg) MG tablet Commonly known as: MAG-OX Take 0.5 tablets (200 mg total) by mouth 2 (two) times daily.   metoprolol tartrate 100 MG tablet Commonly known as: LOPRESSOR Take 1 tablet (100 mg total) by mouth 2 (two) times daily. What changed:   medication strength  how much to take  when to take this  additional instructions   NovoLOG FlexPen 100 UNIT/ML FlexPen Generic drug: insulin aspart Inject 0-16 Units into the skin See admin instructions. Inject 0-16 units into the skin, PER SLIDING SCALE, in the morning, noon, and bedtime   ondansetron 4 MG tablet Commonly known as: ZOFRAN Take 4 mg by mouth every 8 (eight) hours as needed for nausea or vomiting.   OXYGEN Inhale 2.5 L/min into the lungs as needed (for shortness of breath).   potassium chloride SA 20 MEQ tablet  Commonly known as: KLOR-CON Take 2 tablets (40 mEq total) by mouth daily.   PRESERVISION AREDS 2 PO Take 1 capsule by mouth 2 (two) times daily.   sodium chloride 0.65 % Soln nasal spray Commonly known as: OCEAN Place 1 spray into both nostrils as needed for congestion.   venlafaxine 37.5 MG tablet Commonly known as: EFFEXOR Take 37.5 mg by mouth 2 (two) times daily.       Follow-up Information    [facility] Follow up.   Specialty: [place] Why: HHPT/OT services to resume on discharge Contact information: [place] [address] [phone]        Leighton Ruff, MD Follow up in 1 week(s).   Specialty: Family Medicine Why: you need lab work to check kidney functiona and potassium level.  Contact information: [place] [address] [phone]        Jerline Pain, MD  .   Specialty: Cardiology Contact information: [phone] [place] [address] [phone]        Vickie Epley, MD .   Specialties: Cardiology, Radiology Contact information: [address] [place] [address] [phone]              Allergies  Allergen Reactions  . Vasotec Shortness Of Breath and Other (See Comments)    Wheezing, also  . Atorvastatin Other (See Comments)    Leg cramps   . Codeine Nausea And Vomiting  . Cymbalta [Duloxetine Hcl] Itching  . Lansoprazole Itching, Swelling, Rash and Other (See Comments)    Redness, swelling of mouth. During 07/12/20-07/15/20 pt tolerated IV protonix infusion with no reactions.  . Morphine And Related Itching  . Sulfate Itching  . Tramadol Itching  . Adhesive [Tape] Rash  . Scopolamine Rash    Consultations:  Cardiology   Procedures/Studies: DG Chest 2 View  Result Date: 07/24/2020 CLINICAL DATA:  Pleural effusion per order. EXAM: CHEST - 2 VIEW COMPARISON:  July 22, 2020. FINDINGS: Similar cardiomediastinal silhouette. Aortic atherosclerosis. Small left pleural effusion. Overlying left basilar retrocardiac opacity. No acute osseous abnormality. Thoracic vertebral body vertebral augmentation. Partially imaged thoracic spinal cord stimulator. Osteopenia. IMPRESSION: 1. Small left pleural effusion, likely decreased in size. 2. Overlying left basilar retrocardiac opacity, favor atelectasis. Electronically Signed   By: Margaretha Sheffield MD   On: 07/24/2020 11:01   DG Chest 2 View  Result Date: 07/12/2020 CLINICAL DATA:  Shortness of breath EXAM: CHEST - 2 VIEW COMPARISON:  01/27/2020 FINDINGS: The heart size and mediastinal contours are within normal limits. No focal airspace consolidation, pleural effusion, or pneumothorax. Interval cement augmentation of the T8 vertebral body. Unchanged alignment of thoracic spinal stimulator leads. IMPRESSION: No active cardiopulmonary  disease. Electronically Signed   By: Davina Poke D.O.   On: 07/12/2020 14:32   DG Chest Portable 1 View  Result Date: 07/22/2020 CLINICAL DATA:  79 year old with acute onset of respiratory distress that began last night. Personal history of LEFT atrial appendage clipping on 07/01/2020. EXAM: PORTABLE CHEST 1 VIEW COMPARISON:  07/12/2020 and earlier. FINDINGS: Cardiac silhouette UPPER normal in size for AP portable technique, unchanged. Thoracic aorta atherosclerotic, unchanged. Interval development of mild diffuse interstitial pulmonary edema. Moderate-sized LEFT pleural effusion and associated consolidation in the LEFT LOWER LOBE. dorsal cord stimulating device over the mid and lower thoracic spine as noted previously. IMPRESSION: 1. Mild CHF and/or fluid overload. 2. Moderate-sized LEFT pleural effusion and  associated passive atelectasis and/or pneumonia involving the LEFT LOWER LOBE. Electronically Signed   By: Evangeline Dakin M.D.   On: 07/22/2020 17:01   US BREAST LTD UNI RIGHT INC AXILLA  Result Date: 07/09/2020 CLINICAL DATA:  Recall from screening mammography with tomosynthesis, possible mass involving the INNER RIGHT breast at ANTERIOR to MIDDLE depth. EXAM: DIGITAL DIAGNOSTIC RIGHT MAMMOGRAM WITH TOMO ULTRASOUND RIGHT BREAST COMPARISON:  Previous exam(s). ACR Breast Density Category a: The breast tissue is almost entirely fatty. FINDINGS: Tomosynthesis and synthesized digital 2D spot-compression CC and MLO views of the area of concern in the RIGHT breast were obtained. Spot compression images confirm a superficial circumscribed mixed density mass in the INNER breast, containing isodense elements and fat density elements, measuring approximately 7 mm. There is no associated architectural distortion or suspicious calcifications. On correlative physical exam, there is no palpable abnormality in the INNER RIGHT breast. Targeted RIGHT breast ultrasound is performed, showing a superficial nearly  round anechoic mass with a thin internal septation at the 2:30 o'clock position approximately 8 cm from the nipple measuring approximately 6 x 5 x 6 mm, demonstrating mixed posterior characteristics and demonstrating no internal power Doppler flow, corresponding to the screening mammographic finding. No suspicious solid mass is identified. IMPRESSION: Benign fat necrosis/oil cyst in the INNER RIGHT breast at the 2:30 o'clock position approximately 8 cm from nipple which accounts for the screening mammographic finding. RECOMMENDATION: Screening mammogram in one year.(Code:SM-B-01Y) I have discussed the findings and recommendations with the patient. If applicable, a reminder letter will be sent to the patient regarding the next appointment. BI-RADS CATEGORY  2: Benign. Electronically Signed   By: Evangeline Dakin M.D.   On: 07/09/2020 15:13   MM DIAG BREAST TOMO UNI RIGHT  Result Date: 07/09/2020 CLINICAL DATA:  Recall from screening mammography with tomosynthesis, possible mass involving the INNER RIGHT breast at ANTERIOR to MIDDLE depth. EXAM: DIGITAL DIAGNOSTIC RIGHT MAMMOGRAM WITH TOMO ULTRASOUND RIGHT BREAST COMPARISON:  Previous exam(s). ACR Breast Density Category a: The breast tissue is almost entirely fatty. FINDINGS: Tomosynthesis and synthesized digital 2D spot-compression CC and MLO views of the area of concern in the RIGHT breast were obtained. Spot compression images confirm a superficial circumscribed mixed density mass in the INNER breast, containing isodense elements and fat density elements, measuring approximately 7 mm. There is no associated architectural distortion or suspicious calcifications. On correlative physical exam, there is no palpable abnormality in the INNER RIGHT breast. Targeted RIGHT breast ultrasound is performed, showing a superficial nearly round anechoic mass with a thin internal septation at the 2:30 o'clock position approximately 8 cm from the nipple measuring  approximately 6 x 5 x 6 mm, demonstrating mixed posterior characteristics and demonstrating no internal power Doppler flow, corresponding to the screening mammographic finding. No suspicious solid mass is identified. IMPRESSION: Benign fat necrosis/oil cyst in the INNER RIGHT breast at the 2:30 o'clock position approximately 8 cm from nipple which accounts for the screening mammographic finding. RECOMMENDATION: Screening mammogram in one year.(Code:SM-B-01Y) I have discussed the findings and recommendations with the patient. If applicable, a reminder letter will be sent to the patient regarding the next appointment. BI-RADS CATEGORY  2: Benign. Electronically Signed   By: Evangeline Dakin M.D.   On: 07/09/2020 15:13   ECHOCARDIOGRAM COMPLETE  Result Date: 07/13/2020    ECHOCARDIOGRAM REPORT   Patient Name:   [name] Date of Exam: 07/13/2020 Medical Rec #:  [id number]        Height:  67.0 in Accession #:    [id number]       Weight:       157.4 lb Date of Birth:  May 10, 1941         BSA:          1.827 m Patient Age:    89 years         BP:           123/70 mmHg Patient Gender: F                HR:           101 bpm. Exam Location:  Inpatient Procedure: 2D Echo, Color Doppler and Cardiac Doppler Indications:    U72.53 Acute systolic (congestive) heart failure  History:        Patient has prior history of Echocardiogram examinations, most                 recent 07/07/2020. Arrythmias:Atrial Fibrillation; Risk                 Factors:Hypertension, Diabetes and Dyslipidemia. 36mm Watchman                 FLX placed 07/01/20.  Sonographer:    Raquel Sarna Senior RDCS Referring Phys: [place]  1. Left ventricular ejection fraction, by estimation, is 55 to 60%. The left ventricle has normal function. The left ventricle has no regional wall motion abnormalities. There is mild asymmetric left ventricular hypertrophy of the posterior-lateral segment. Left ventricular diastolic parameters are  indeterminate. Elevated left ventricular end-diastolic pressure.  2. Right ventricular systolic function is mildly reduced. The right ventricular size is mildly enlarged. There is normal pulmonary artery systolic pressure. The estimated right ventricular systolic pressure is 66.4 mmHg.  3. Left atrial size was moderately dilated.  4. Right atrial size was severely dilated.  5. The mitral valve is abnormal. Trivial mitral valve regurgitation. Moderate mitral annular calcification.  6. Tricuspid valve regurgitation is mild to moderate.  7. The aortic valve is grossly normal. Aortic valve regurgitation is not visualized. No aortic stenosis is present.  8. The inferior vena cava is normal in size with greater than 50% respiratory variability, suggesting right atrial pressure of 3 mmHg.  9. S/p 27 mm Watchman FLX 07/01/20, which is not well visualized or assessed on this transthoracic echo. FINDINGS  Left Ventricle: Left ventricular ejection fraction, by estimation, is 55 to 60%. The left ventricle has normal function. The left ventricle has no regional wall motion abnormalities. The left ventricular internal cavity size was normal in size. There is  mild asymmetric left ventricular hypertrophy of the posterior-lateral segment. Left ventricular diastolic parameters are indeterminate. Elevated left ventricular end-diastolic pressure. Right Ventricle: The right ventricular size is mildly enlarged. Right vetricular wall thickness was not well visualized. Right ventricular systolic function is mildly reduced. There is normal pulmonary artery systolic pressure. The tricuspid regurgitant velocity is 2.79 m/s, and with an assumed right atrial pressure of 3 mmHg, the estimated right ventricular systolic pressure is 40.3 mmHg. Left Atrium: Left atrial size was moderately dilated. Right Atrium: Right atrial size was severely dilated. Pericardium: Trivial pericardial effusion is present. Mitral Valve: The mitral valve is abnormal.  There is mild calcification of the mitral valve leaflet(s). Moderate mitral annular calcification. Trivial mitral valve regurgitation. Tricuspid Valve: The tricuspid valve is grossly normal. Tricuspid valve regurgitation is mild to moderate. Aortic Valve: The aortic valve is grossly normal. Aortic valve regurgitation is not  visualized. No aortic stenosis is present. Pulmonic Valve: The pulmonic valve was not well visualized. Pulmonic valve regurgitation is trivial. Aorta: The aortic root and ascending aorta are structurally normal, with no evidence of dilitation. Venous: The inferior vena cava is normal in size with greater than 50% respiratory variability, suggesting right atrial pressure of 3 mmHg. IAS/Shunts: The interatrial septum was not well visualized.  LEFT VENTRICLE PLAX 2D LVIDd:         3.63 cm LVIDs:         2.90 cm LV PW:         1.14 cm LV IVS:        0.91 cm LVOT diam:     2.00 cm LV SV:         65 LV SV Index:   36 LVOT Area:     3.14 cm  RIGHT VENTRICLE RV S prime:     10.60 cm/s TAPSE (M-mode): 1.9 cm LEFT ATRIUM             Index       RIGHT ATRIUM           Index LA diam:        3.60 cm 1.97 cm/m  RA Area:     23.80 cm LA Vol (A2C):   65.4 ml 35.81 ml/m RA Volume:   88.20 ml  48.29 ml/m LA Vol (A4C):   64.3 ml 35.20 ml/m LA Biplane Vol: 74.3 ml 40.68 ml/m  AORTIC VALVE LVOT Vmax:   95.30 cm/s LVOT Vmean:  70.500 cm/s LVOT VTI:    0.207 m  AORTA Ao Root diam: 2.90 cm Ao Asc diam:  3.00 cm TRICUSPID VALVE TR Peak grad:   31.1 mmHg TR Vmax:        279.00 cm/s  SHUNTS Systemic VTI:  0.21 m Systemic Diam: 2.00 cm Cherlynn Kaiser MD Electronically signed by Cherlynn Kaiser MD Signature Date/Time: 07/13/2020/1:29:03 PM    Final    ECHO TEE  Result Date: 07/01/2020    TRANSESOPHOGEAL ECHO REPORT   Patient Name:   [name] Date of Exam: 07/01/2020 Medical Rec #:  [id number]        Height:       64.0 in Accession #:    [id number]       Weight:       155.0 lb Date of Birth:  12-03-1940          BSA:          1.756 m Patient Age:    27 years         BP:           144/72 mmHg Patient Gender: F                HR:           64 bpm. Exam Location:  Inpatient Procedure: Transesophageal Echo, Color Doppler, Cardiac Doppler and 3D Echo Indications:     Watchman Procedure,; I48.91* Unspecified atrial fibrillation  History:         Patient has prior history of Echocardiogram examinations, most                  recent 04/28/2020. Arrythmias:Atrial Fibrillation; Risk                  Factors:Hypertension, Diabetes and Dyslipidemia.  Sonographer:     Raquel Sarna Senior RDCS Referring Phys:  [id number] Vickie Epley Diagnosing Phys: Eleonore Chiquito MD  Sonographer  Comments: 42mm Watchman LAA Occluder. PROCEDURE: After discussion of the risks and benefits of a TEE, an informed consent was obtained from the patient. The transesophogeal probe was passed without difficulty through the esophogus of the patient. Sedation performed by different physician. The patient was monitored while under deep sedation. Anesthestetic sedation was provided intravenously by Anesthesiology: 150mg  of Propofol. Image quality was excellent. The patient's vital signs; including heart rate, blood pressure, and oxygen saturation; remained stable throughout the procedure. The patient developed no complications during the procedure. IMPRESSIONS  1. Intraprocedure guidance for left atrial appendage closure. IAS puncture was visualized with echo and small L to R shunt remained at the end of the procedure. A 27 mm Watchman FLX device was deployed with maximum diameter 21.9 mm with 19% compression.  No device leak detected by color doppler. A small pericardial effusion was detected before the case anterior to the RV ~0.4-0.5 cm. The effusion remained at the end of the case with enlargement. The procedure was tolerated well with no immediate complications.  2. Left ventricular ejection fraction, by estimation, is 60 to 65%. The left ventricle has normal  function.  3. Right ventricular systolic function is normal. The right ventricular size is mildly enlarged.  4. Left atrial size was mildly dilated. No left atrial/left atrial appendage thrombus was detected.  5. A small pericardial effusion is present. The pericardial effusion is anterior to the right ventricle. There is no evidence of cardiac tamponade.  6. The mitral valve is grossly normal. Trivial mitral valve regurgitation. No evidence of mitral stenosis.  7. The aortic valve is tricuspid. There is mild calcification of the aortic valve. Aortic valve regurgitation is not visualized. Mild aortic valve sclerosis is present, with no evidence of aortic valve stenosis.  8. There is mild (Grade II) layered plaque involving the descending aorta and transverse aorta. FINDINGS  Left Ventricle: Left ventricular ejection fraction, by estimation, is 60 to 65%. The left ventricle has normal function. The left ventricular internal cavity size was normal in size. Right Ventricle: The right ventricular size is mildly enlarged. No increase in right ventricular wall thickness. Right ventricular systolic function is normal. Left Atrium: Left atrial size was mildly dilated. No left atrial/left atrial appendage thrombus was detected. Right Atrium: Right atrial size was normal in size. Pericardium: A small pericardial effusion is present. The pericardial effusion is anterior to the right ventricle. There is no evidence of cardiac tamponade. Mitral Valve: The mitral valve is grossly normal. Trivial mitral valve regurgitation. No evidence of mitral valve stenosis. Tricuspid Valve: The tricuspid valve is grossly normal. Tricuspid valve regurgitation is trivial. No evidence of tricuspid stenosis. Aortic Valve: The aortic valve is tricuspid. There is mild calcification of the aortic valve. Aortic valve regurgitation is not visualized. Mild aortic valve sclerosis is present, with no evidence of aortic valve stenosis. Pulmonic Valve: The  pulmonic valve was grossly normal. Pulmonic valve regurgitation is not visualized. No evidence of pulmonic stenosis. Aorta: The aortic root is normal in size and structure. There is mild (Grade II) layered plaque involving the descending aorta and transverse aorta. Venous: The left upper pulmonary vein, left lower pulmonary vein, right lower pulmonary vein and right upper pulmonary vein are normal. IAS/Shunts: The atrial septum is grossly normal.  TRICUSPID VALVE TR Peak grad:   11.2 mmHg TR Vmax:        167.00 cm/s Eleonore Chiquito MD Electronically signed by Eleonore Chiquito MD Signature Date/Time: 07/01/2020/2:03:15 PM    Final (Updated)  ECHOCARDIOGRAM LIMITED  Result Date: 07/07/2020    ECHOCARDIOGRAM LIMITED REPORT   Patient Name:   [name] Date of Exam: 07/07/2020 Medical Rec #:  [id number]        Height:       64.0 in Accession #:    [id number]       Weight:       155.0 lb Date of Birth:  1941-04-01         BSA:          1.756 m Patient Age:    89 years         BP:           130/60 mmHg Patient Gender: F                HR:           81 bpm. Exam Location:  [address] Procedure: Limited Echo, Cardiac Doppler and Limited Color Doppler Indications:    I31.3 Pericardial Effusion  History:        Patient has prior history of Echocardiogram examinations, most                 recent 07/02/2020. Arrythmias:Atrial Fibrillation and PAC,                 Signs/Symptoms:Murmur, Dizziness/Lightheadedness and Shortness                 of Breath; Risk Factors:Hypertension, Diabetes and Family                 History of Coronary Artery Disease. #27 Watchman Device                 Implanted.  Sonographer:    Deliah Boston RDCS Referring Phys: [id number] [place]  1. Left ventricular ejection fraction, by estimation, is 60 to 65%. The left ventricle has normal function. The left ventricle has no regional wall motion abnormalities. Left ventricular diastolic parameters are indeterminate.  2.  Right ventricular systolic function is normal. The right ventricular size is normal. There is normal pulmonary artery systolic pressure.  3. Post Watchman with 27 mm FLX not visualized on TTE . Left atrial size was mildly dilated.  4. Residual "ASD: flow from transeptal puncture noted only left to right by color . Evidence of atrial level shunting detected by color flow Doppler.  5. Previous trivial pericardial effusion noted over the RV free wall and transverse sinus not noted on this echo.  6. The mitral valve is abnormal. Trivial mitral valve regurgitation. No evidence of mitral stenosis. Moderate mitral annular calcification.  7. The aortic valve is tricuspid. Aortic valve regurgitation is not visualized. Mild to moderate aortic valve sclerosis/calcification is present, without any evidence of aortic stenosis.  8. The inferior vena cava is normal in size with greater than 50% respiratory variability, suggesting right atrial pressure of 3 mmHg. FINDINGS  Left Ventricle: Left ventricular ejection fraction, by estimation, is 60 to 65%. The left ventricle has normal function. The left ventricle has no regional wall motion abnormalities. The left ventricular internal cavity size was normal in size. There is  no left ventricular hypertrophy. Left ventricular diastolic parameters are indeterminate. Right Ventricle: The right ventricular size is normal. No increase in right ventricular wall thickness. Right ventricular systolic function is normal. There is normal pulmonary artery systolic pressure. The tricuspid regurgitant velocity is 2.67 m/s, and  with an assumed right atrial pressure of 3 mmHg, the estimated  right ventricular systolic pressure is 43.3 mmHg. Left Atrium: Post Watchman with 27 mm FLX not visualized on TTE. Left atrial size was mildly dilated. Right Atrium: Right atrial size was normal in size. Pericardium: Previous trivial pericardial effusion noted over the RV free wall and transverse sinus not noted  on this echo. There is no evidence of pericardial effusion. Mitral Valve: The mitral valve is abnormal. There is mild thickening of the mitral valve leaflet(s). There is mild calcification of the mitral valve leaflet(s). Moderate mitral annular calcification. Trivial mitral valve regurgitation. No evidence of mitral valve stenosis. MV peak gradient, 9.2 mmHg. The mean mitral valve gradient is 2.5 mmHg. Tricuspid Valve: The tricuspid valve is normal in structure. Tricuspid valve regurgitation is mild . No evidence of tricuspid stenosis. Aortic Valve: The aortic valve is tricuspid. Aortic valve regurgitation is not visualized. Mild to moderate aortic valve sclerosis/calcification is present, without any evidence of aortic stenosis. Pulmonic Valve: The pulmonic valve was normal in structure. Pulmonic valve regurgitation is not visualized. No evidence of pulmonic stenosis. Aorta: The aortic root is normal in size and structure. Venous: The inferior vena cava is normal in size with greater than 50% respiratory variability, suggesting right atrial pressure of 3 mmHg. IAS/Shunts: Evidence of atrial level shunting detected by color flow Doppler. LEFT VENTRICLE PLAX 2D LVIDd:         4.20 cm  Diastology LVIDs:         2.90 cm  LV e' medial:    12.00 cm/s LV PW:         1.10 cm  LV E/e' medial:  9.4 LV IVS:        1.00 cm  LV e' lateral:   9.68 cm/s LVOT diam:     2.00 cm  LV E/e' lateral: 11.7 LV SV:         47 LV SV Index:   27 LVOT Area:     3.14 cm  RIGHT VENTRICLE RV S prime:     14.50 cm/s TAPSE (M-mode): 1.5 cm LEFT ATRIUM             Index       RIGHT ATRIUM           Index LA diam:        4.00 cm 2.28 cm/m  RA Area:     12.30 cm LA Vol (A2C):   71.1 ml 40.50 ml/m RA Volume:   27.50 ml  15.66 ml/m LA Vol (A4C):   47.9 ml 27.29 ml/m LA Biplane Vol: 59.7 ml 34.01 ml/m  AORTIC VALVE LVOT Vmax:   81.10 cm/s LVOT Vmean:  56.400 cm/s LVOT VTI:    0.149 m  AORTA Ao Root diam: 3.10 cm MITRAL VALVE                 TRICUSPID VALVE MV Area (PHT): 2.85 cm     TR Peak grad:   28.5 mmHg MV Peak grad:  9.2 mmHg     TR Vmax:        267.00 cm/s MV Mean grad:  2.5 mmHg MV Vmax:       1.52 m/s     SHUNTS MV Vmean:      65.8 cm/s    Systemic VTI:  0.15 m MV Decel Time: 266 msec     Systemic Diam: 2.00 cm MV E velocity: 113.00 cm/s MV A velocity: 53.70 cm/s MV E/A ratio:  2.10 Jenkins Rouge MD Electronically signed by Jenkins Rouge  MD Signature Date/Time: 07/07/2020/4:06:05 PM    Final    ECHOCARDIOGRAM LIMITED  Result Date: 07/02/2020    ECHOCARDIOGRAM LIMITED REPORT   Patient Name:   [name] Date of Exam: 07/02/2020 Medical Rec #:  [id number]        Height:       64.0 in Accession #:    [id number]       Weight:       155.0 lb Date of Birth:  [birth year]         BSA:          1.756 m Patient Age:    31 years         BP:           124/60 mmHg Patient Gender: F                HR:           88 bpm. Exam Location:  Inpatient Procedure: Limited Echo, Cardiac Doppler and Color Doppler                            MODIFIED REPORT: This report was modified by Dorris Carnes MD on 07/02/2020 due to complete.  Indications:     Pericardial effusion  History:         Patient has prior history of Echocardiogram examinations, most                  recent 07/01/2020. Arrythmias:Atrial Fibrillation; Risk                  Factors:Hypertension, Diabetes and Dyslipidemia. 29mm Watchman                  Flex Device.  Sonographer:     Dustin Flock Referring Phys:  [id number] Woodfin Ganja THOMPSON Diagnosing Phys: Dorris Carnes MD IMPRESSIONS  1. Left ventricular ejection fraction, by estimation, is 55 to 60%. The left ventricle has normal function. There is mild left ventricular hypertrophy. Left ventricular diastolic parameters were normal.  2. Right ventricular systolic function is normal. The right ventricular size is normal. There is normal pulmonary artery systolic pressure.  3. The mitral valve is abnormal. Mild mitral valve regurgitation.  4. The aortic  valve is abnormal. Mild to moderate aortic valve sclerosis/calcification is present, without any evidence of aortic stenosis.  5. The inferior vena cava is normal in size with greater than 50% respiratory variability, suggesting right atrial pressure of 3 mmHg. Comparison(s): The left ventricular function is unchanged. FINDINGS  Left Ventricle: Left ventricular ejection fraction, by estimation, is 55 to 60%. The left ventricle has normal function. The left ventricular internal cavity size was normal in size. There is mild left ventricular hypertrophy. Left ventricular diastolic  parameters were normal. Right Ventricle: The right ventricular size is normal. Right vetricular wall thickness was not assessed. Right ventricular systolic function is normal. There is normal pulmonary artery systolic pressure. The tricuspid regurgitant velocity is 2.78 m/s, and with an assumed right atrial pressure of 3 mmHg, the estimated right ventricular systolic pressure is 87.5 mmHg. Left Atrium: Left atrial size was normal in size. Right Atrium: Right atrial size was normal in size. Pericardium: There is no evidence of pericardial effusion. Mitral Valve: The mitral valve is abnormal. There is mild thickening of the mitral valve leaflet(s). Mild mitral annular calcification. Mild mitral valve regurgitation. Tricuspid Valve: The tricuspid valve is normal in structure. Tricuspid valve  regurgitation is mild. Aortic Valve: The aortic valve is abnormal. Mild to moderate aortic valve sclerosis/calcification is present, without any evidence of aortic stenosis. Pulmonic Valve: The pulmonic valve was not well visualized. Venous: The inferior vena cava is normal in size with greater than 50% respiratory variability, suggesting right atrial pressure of 3 mmHg. LEFT VENTRICLE PLAX 2D LVIDd:         4.10 cm Diastology LVIDs:         3.00 cm LV e' medial:    5.22 cm/s LV PW:         1.30 cm LV E/e' medial:  22.8 LV IVS:        1.40 cm LV e' lateral:    8.27 cm/s                        LV E/e' lateral: 14.4  LEFT ATRIUM         Index LA diam:    3.40 cm 1.94 cm/m   AORTA Ao Root diam: 2.90 cm MITRAL VALVE                TRICUSPID VALVE MV Area (PHT): 4.68 cm     TR Peak grad:   30.9 mmHg MV Decel Time: 162 msec     TR Vmax:        278.00 cm/s MV E velocity: 119.00 cm/s MV A velocity: 76.60 cm/s MV E/A ratio:  1.55 Dorris Carnes MD Electronically signed by Dorris Carnes MD Signature Date/Time: 07/02/2020/2:05:55 PM    Final (Updated)     Subjective: She is breathing better, she wants to ambulate prior to discharge.  PT will see patient today.  Discharge Exam: Vitals:   07/27/20 1100 07/27/20 1124  BP:    Pulse: 62 65  Resp: 20 20  Temp:    SpO2: 99% 99%     General: Pt is alert, awake, not in acute distress Cardiovascular: RRR, S1/S2 +, no rubs, no gallops Respiratory: CTA bilaterally, no wheezing, no rhonchi Abdominal: Soft, NT, ND, bowel sounds + Extremities: no edema, no cyanosis    The results of significant diagnostics from this hospitalization (including imaging, microbiology, ancillary and laboratory) are listed below for reference.     Microbiology: Recent Results (from the past 240 hour(s))  Resp Panel by RT-PCR (Flu A&B, Covid) Nasopharyngeal Swab     Status: None   Collection Time: 07/22/20  4:24 PM   Specimen: Nasopharyngeal Swab; Nasopharyngeal(NP) swabs in vial transport medium  Result Value Ref Range Status   SARS Coronavirus 2 by RT PCR NEGATIVE NEGATIVE Final    Comment: (NOTE) SARS-CoV-2 target nucleic acids are NOT DETECTED.  The SARS-CoV-2 RNA is generally detectable in upper respiratory specimens during the acute phase of infection. The lowest concentration of SARS-CoV-2 viral copies this assay can detect is 138 copies/mL. A negative result does not preclude SARS-Cov-2 infection and should not be used as the sole basis for treatment or other patient management decisions. A negative result may occur with   improper specimen collection/handling, submission of specimen other than nasopharyngeal swab, presence of viral mutation(s) within the areas targeted by this assay, and inadequate number of viral copies(<138 copies/mL). A negative result must be combined with clinical observations, patient history, and epidemiological information. The expected result is Negative.  Fact Sheet for Patients:  EntrepreneurPulse.com.au  Fact Sheet for Healthcare Providers:  IncredibleEmployment.be  This test is no t yet approved or cleared by the [place] FDA  and  has been authorized for detection and/or diagnosis of SARS-CoV-2 by FDA under an Emergency Use Authorization (EUA). This EUA will remain  in effect (meaning this test can be used) for the duration of the COVID-19 declaration under Section 564(b)(1) of the Act, 21 U.S.C.section 360bbb-3(b)(1), unless the authorization is terminated  or revoked sooner.       Influenza A by PCR NEGATIVE NEGATIVE Final   Influenza B by PCR NEGATIVE NEGATIVE Final    Comment: (NOTE) The Xpert Xpress SARS-CoV-2/FLU/RSV plus assay is intended as an aid in the diagnosis of influenza from Nasopharyngeal swab specimens and should not be used as a sole basis for treatment. Nasal washings and aspirates are unacceptable for Xpert Xpress SARS-CoV-2/FLU/RSV testing.  Fact Sheet for Patients: EntrepreneurPulse.com.au  Fact Sheet for Healthcare Providers: IncredibleEmployment.be  This test is not yet approved or cleared by the [place] FDA and has been authorized for detection and/or diagnosis of SARS-CoV-2 by FDA under an Emergency Use Authorization (EUA). This EUA will remain in effect (meaning this test can be used) for the duration of the COVID-19 declaration under Section 564(b)(1) of the Act, 21 U.S.C. section 360bbb-3(b)(1), unless the authorization is terminated  or revoked.  Performed at [facility], [place] [address], [place], [place] [address]      Labs: BNP (last 3 results) Recent Labs    07/22/20 1624  BNP 270.6*   Basic Metabolic Panel: Recent Labs  Lab 07/23/20 0201 07/24/20 0412 07/25/20 0056 07/25/20 0710 07/26/20 0046 07/27/20 0241  NA 137 136 136  --  133* 137  K 4.0 3.7 3.8  --  3.3* 4.6  CL 96* 89* 87*  --  88* 91*  CO2 27 35* 36*  --  36* 35*  GLUCOSE 213* 143* 217*  --  216* 205*  BUN 13 12 17   --  21 26*  CREATININE 0.93 0.83 1.26*  --  1.08* 0.92  CALCIUM 9.3 9.4 9.1  --  9.1 9.4  MG  --   --   --  1.4* 1.8 1.8   Liver Function Tests: Recent Labs  Lab 07/22/20 1624  AST 19  ALT 21  ALKPHOS 86  BILITOT 0.7  PROT 6.8  ALBUMIN 3.6   No results for input(s): LIPASE, AMYLASE in the last 168 hours. No results for input(s): AMMONIA in the last 168 hours. CBC: Recent Labs  Lab 07/22/20 1624 07/22/20 1624 07/22/20 1728 07/23/20 0201 07/25/20 0710 07/26/20 0046 07/27/20 0241  WBC 15.6*  --   --  11.5* 10.1 13.3* 9.1  NEUTROABS 13.2*  --   --  8.8*  --   --   --   HGB 9.3*   < > 9.9* 9.5* 10.4* 10.1* 10.0*  HCT 31.9*   < > 29.0* 31.8* 34.4* 32.7* 33.4*  MCV 94.1  --   --  92.4 88.9 87.4 90.0  PLT 474*  --   --  391 389 396 366   < > = values in this interval not displayed.   Cardiac Enzymes: No results for input(s): CKTOTAL, CKMB, CKMBINDEX, TROPONINI in the last 168 hours. BNP: Invalid input(s): POCBNP CBG: Recent Labs  Lab 07/26/20 0757 07/26/20 1102 07/26/20 1608 07/26/20 2113 07/27/20 0741  GLUCAP 150* 320* 168* 286* 174*   D-Dimer No results for input(s): DDIMER in the last 72 hours. Hgb A1c No results for input(s): HGBA1C in the last 72 hours. Lipid Profile No results for input(s): CHOL, HDL, LDLCALC, TRIG, CHOLHDL,  LDLDIRECT in the last 72 hours. Thyroid function studies No results for input(s): TSH, T4TOTAL, T3FREE, THYROIDAB in the last 72 hours.  Invalid input(s):  FREET3 Anemia work up No results for input(s): VITAMINB12, FOLATE, FERRITIN, TIBC, IRON, RETICCTPCT in the last 72 hours. Urinalysis    Component Value Date/Time   COLORURINE STRAW (A) 07/22/2020 1859   APPEARANCEUR CLEAR 07/22/2020 1859   LABSPEC 1.011 07/22/2020 1859   PHURINE 6.0 07/22/2020 1859   GLUCOSEU NEGATIVE 07/22/2020 1859   HGBUR NEGATIVE 07/22/2020 1859   BILIRUBINUR NEGATIVE 07/22/2020 1859   KETONESUR NEGATIVE 07/22/2020 1859   PROTEINUR NEGATIVE 07/22/2020 1859   UROBILINOGEN 0.2 03/20/2011 2257   NITRITE NEGATIVE 07/22/2020 1859   LEUKOCYTESUR NEGATIVE 07/22/2020 1859   Sepsis Labs Invalid input(s): PROCALCITONIN,  WBC,  LACTICIDVEN Microbiology Recent Results (from the past 240 hour(s))  Resp Panel by RT-PCR (Flu A&B, Covid) Nasopharyngeal Swab     Status: None   Collection Time: 07/22/20  4:24 PM   Specimen: Nasopharyngeal Swab; Nasopharyngeal(NP) swabs in vial transport medium  Result Value Ref Range Status   SARS Coronavirus 2 by RT PCR NEGATIVE NEGATIVE Final    Comment: (NOTE) SARS-CoV-2 target nucleic acids are NOT DETECTED.  The SARS-CoV-2 RNA is generally detectable in upper respiratory specimens during the acute phase of infection. The lowest concentration of SARS-CoV-2 viral copies this assay can detect is 138 copies/mL. A negative result does not preclude SARS-Cov-2 infection and should not be used as the sole basis for treatment or other patient management decisions. A negative result may occur with  improper specimen collection/handling, submission of specimen other than nasopharyngeal swab, presence of viral mutation(s) within the areas targeted by this assay, and inadequate number of viral copies(<138 copies/mL). A negative result must be combined with clinical observations, patient history, and epidemiological information. The expected result is Negative.  Fact Sheet for Patients:  EntrepreneurPulse.com.au  Fact Sheet  for Healthcare Providers:  IncredibleEmployment.be  This test is no t yet approved or cleared by the [place] FDA and  has been authorized for detection and/or diagnosis of SARS-CoV-2 by FDA under an Emergency Use Authorization (EUA). This EUA will remain  in effect (meaning this test can be used) for the duration of the COVID-19 declaration under Section 564(b)(1) of the Act, 21 U.S.C.section 360bbb-3(b)(1), unless the authorization is terminated  or revoked sooner.       Influenza A by PCR NEGATIVE NEGATIVE Final   Influenza B by PCR NEGATIVE NEGATIVE Final    Comment: (NOTE) The Xpert Xpress SARS-CoV-2/FLU/RSV plus assay is intended as an aid in the diagnosis of influenza from Nasopharyngeal swab specimens and should not be used as a sole basis for treatment. Nasal washings and aspirates are unacceptable for Xpert Xpress SARS-CoV-2/FLU/RSV testing.  Fact Sheet for Patients: EntrepreneurPulse.com.au  Fact Sheet for Healthcare Providers: IncredibleEmployment.be  This test is not yet approved or cleared by the [place] FDA and has been authorized for detection and/or diagnosis of SARS-CoV-2 by FDA under an Emergency Use Authorization (EUA). This EUA will remain in effect (meaning this test can be used) for the duration of the COVID-19 declaration under Section 564(b)(1) of the Act, 21 U.S.C. section 360bbb-3(b)(1), unless the authorization is terminated or revoked.  Performed at [facility], [place] [address] [place], [place] [address]      Time coordinating discharge: 40 minutes  SIGNED:   Elmarie Shiley, MD  [facility]

## 2020-07-27 NOTE — Progress Notes (Signed)
Physical Therapy Treatment Patient Details Name: [name] MRN: [id number] DOB: 07-02-1941 Today's Date: 07/27/2020    History of Present Illness 79 y.o. female admitted on 07/22/20 for SOB.  Pt found to have acute hypoxic respiratory failure requiring BiPAP on admission.  Dx with acute diastolic heart failure exacerbation and L pleural effusion.  PAF RVR.  Pt with significant PMH of recent GIB, essential HTN, DM2, PVD, Fe deficient anemia, CKD, B TKA.      PT Comments    Patient with improved mobility this session. Patient able to ambulate 125' with RW and min guard for safety. Patient on 2L O2 via [place] with spO2 maintaining >95% during ambulation. Performed walking saturations, see note. Patient continues to be limited by generalized weakness, impaired balance, decreased activity tolerance. Continue to recommend HHPT following discharge to maximize functional mobility and safety.    Follow Up Recommendations  Home health PT;Supervision for mobility/OOB     Equipment Recommendations  None recommended by PT    Recommendations for Other Services       Precautions / Restrictions Precautions Precautions: Fall Precaution Comments: multiple due to neuropathy Restrictions Weight Bearing Restrictions: No    Mobility  Bed Mobility Overal bed mobility: Needs Assistance Bed Mobility: Supine to Sit     Supine to sit: Min guard;HOB elevated Sit to supine: Min guard   General bed mobility comments: min guard for guidance with no physical assist required   Transfers Overall transfer level: Needs assistance Equipment used: Rolling Kinza Gouveia (2 wheeled) Transfers: Sit to/from Stand Sit to Stand: Min guard         General transfer comment: use of momentum to achieve standing  Ambulation/Gait Ambulation/Gait assistance: Min guard Gait Distance (Feet): 125 Feet Assistive device: Rolling Yitta Gongaware (2 wheeled) Gait Pattern/deviations: Step-through pattern;Decreased stride  length;Wide base of support     General Gait Details: slow steady pace throughout ambulation   Stairs             Wheelchair Mobility    Modified Rankin (Stroke Patients Only)       Balance Overall balance assessment: Needs assistance Sitting-balance support: Feet supported;Bilateral upper extremity supported Sitting balance-Leahy Scale: Fair     Standing balance support: Bilateral upper extremity supported;During functional activity Standing balance-Leahy Scale: Poor Standing balance comment: Reliance on BUE on RW                            Cognition Arousal/Alertness: Awake/alert Behavior During Therapy: WFL for tasks assessed/performed Overall Cognitive Status: Within Functional Limits for tasks assessed                                        Exercises General Exercises - Lower Extremity Mini-Sqauts: 5 reps    General Comments General comments (skin integrity, edema, etc.): performed walking saturations (see note)      Pertinent Vitals/Pain Pain Assessment: Faces Faces Pain Scale: Hurts little more Pain Location: R shoulder Pain Descriptors / Indicators: Aching;Sore Pain Intervention(s): Monitored during session    Home Living                      Prior Function            PT Goals (current goals can now be found in the care plan section) Acute Rehab PT Goals Patient Stated Goal: to get  back home and get her strength back PT Goal Formulation: With patient/family Time For Goal Achievement: 08/08/20 Potential to Achieve Goals: Good Progress towards PT goals: Progressing toward goals    Frequency    Min 3X/week      PT Plan Current plan remains appropriate    Co-evaluation              AM-PAC PT "6 Clicks" Mobility   Outcome Measure  Help needed turning from your back to your side while in a flat bed without using bedrails?: A Little Help needed moving from lying on your back to sitting on  the side of a flat bed without using bedrails?: A Little Help needed moving to and from a bed to a chair (including a wheelchair)?: A Little Help needed standing up from a chair using your arms (e.g., wheelchair or bedside chair)?: A Little Help needed to walk in hospital room?: A Little Help needed climbing 3-5 steps with a railing? : A Little 6 Click Score: 18    End of Session Equipment Utilized During Treatment: Gait belt;Oxygen Activity Tolerance: Patient tolerated treatment well Patient left: in bed;with call bell/phone within reach;with family/visitor present Nurse Communication: Mobility status PT Visit Diagnosis: Muscle weakness (generalized) (M62.81);Difficulty in walking, not elsewhere classified (R26.2)     Time: 0175-1025 PT Time Calculation (min) (ACUTE ONLY): 26 min  Charges:  $Therapeutic Activity: 23-37 mins                     Perrin Maltese, PT, DPT Acute Rehabilitation Services Pager [phone] Office [phone]    Melene Plan Allred 07/27/2020, 10:25 AM

## 2020-07-27 NOTE — Progress Notes (Signed)
Pt and her significant other received discharge instructions. They are educated about new medications, diet and follow up appointments. They do not have any additional questions or concerns.

## 2020-07-29 ENCOUNTER — Telehealth: Payer: Self-pay | Admitting: Cardiology

## 2020-07-29 DIAGNOSIS — I5030 Unspecified diastolic (congestive) heart failure: Secondary | ICD-10-CM | POA: Diagnosis not present

## 2020-07-29 DIAGNOSIS — N183 Chronic kidney disease, stage 3 unspecified: Secondary | ICD-10-CM | POA: Diagnosis not present

## 2020-07-29 DIAGNOSIS — K2100 Gastro-esophageal reflux disease with esophagitis, without bleeding: Secondary | ICD-10-CM | POA: Diagnosis not present

## 2020-07-29 DIAGNOSIS — D500 Iron deficiency anemia secondary to blood loss (chronic): Secondary | ICD-10-CM | POA: Diagnosis not present

## 2020-07-29 DIAGNOSIS — D62 Acute posthemorrhagic anemia: Secondary | ICD-10-CM | POA: Diagnosis not present

## 2020-07-29 DIAGNOSIS — E1122 Type 2 diabetes mellitus with diabetic chronic kidney disease: Secondary | ICD-10-CM | POA: Diagnosis not present

## 2020-07-29 DIAGNOSIS — I130 Hypertensive heart and chronic kidney disease with heart failure and stage 1 through stage 4 chronic kidney disease, or unspecified chronic kidney disease: Secondary | ICD-10-CM | POA: Diagnosis not present

## 2020-07-29 DIAGNOSIS — K31819 Angiodysplasia of stomach and duodenum without bleeding: Secondary | ICD-10-CM | POA: Diagnosis not present

## 2020-07-29 DIAGNOSIS — K922 Gastrointestinal hemorrhage, unspecified: Secondary | ICD-10-CM | POA: Diagnosis not present

## 2020-07-29 NOTE — Telephone Encounter (Signed)
Patient states she has been having nose bleeds the last 2 nights since she came back from the hospital. She states they did not last long, but she is not sure if she is taking all of her medications correctly.

## 2020-07-29 NOTE — Telephone Encounter (Signed)
Spoke with Dr. Quentin Ore who performed watchman device, left atrial occlusion device. We will go ahead and stop her aspirin 81 mg.  Continue with Eliquis for now.  Agree with moisturizing nasal passages.  Candee Furbish, MD

## 2020-07-29 NOTE — Telephone Encounter (Signed)
Pt aware to d/c ASA but continue Eliquis.  Also advised she could try a humidifier in her bedroom at night.  She thanked me for the call back ad information  She will c/b if any further concerns arise.

## 2020-07-29 NOTE — Telephone Encounter (Signed)
Spoke with patient who reports having had 2 nose bleeds since being home from the hospital.  The 1st times the blood was running down her face but wasn't "a lot."  The 2nd time (this AM) she saw some blood on her pillow case when she got up.  She believes this is coming from having to use oxygen via n/c now.   She did have recent hospitalization for GI bleed but has not noted any dark tarry stool, bleed gums etc. She take both ASA 81 mg a day and Eliquis 5 mg twice a day.  Advised pt to use saline nasal spray to keep tissues moist and to contact the compnay who supplies her 02 to see if they have a way to moisturize the 02.  She is aware I will forward to Dr Marlou Porch for his review and any further recommendations/orders.

## 2020-07-30 DIAGNOSIS — K2100 Gastro-esophageal reflux disease with esophagitis, without bleeding: Secondary | ICD-10-CM | POA: Diagnosis not present

## 2020-07-30 DIAGNOSIS — I130 Hypertensive heart and chronic kidney disease with heart failure and stage 1 through stage 4 chronic kidney disease, or unspecified chronic kidney disease: Secondary | ICD-10-CM | POA: Diagnosis not present

## 2020-07-30 DIAGNOSIS — I5030 Unspecified diastolic (congestive) heart failure: Secondary | ICD-10-CM | POA: Diagnosis not present

## 2020-07-30 DIAGNOSIS — K922 Gastrointestinal hemorrhage, unspecified: Secondary | ICD-10-CM | POA: Diagnosis not present

## 2020-07-30 DIAGNOSIS — N183 Chronic kidney disease, stage 3 unspecified: Secondary | ICD-10-CM | POA: Diagnosis not present

## 2020-07-30 DIAGNOSIS — K31819 Angiodysplasia of stomach and duodenum without bleeding: Secondary | ICD-10-CM | POA: Diagnosis not present

## 2020-07-30 DIAGNOSIS — E1122 Type 2 diabetes mellitus with diabetic chronic kidney disease: Secondary | ICD-10-CM | POA: Diagnosis not present

## 2020-07-30 DIAGNOSIS — D500 Iron deficiency anemia secondary to blood loss (chronic): Secondary | ICD-10-CM | POA: Diagnosis not present

## 2020-07-30 DIAGNOSIS — D62 Acute posthemorrhagic anemia: Secondary | ICD-10-CM | POA: Diagnosis not present

## 2020-08-04 DIAGNOSIS — D62 Acute posthemorrhagic anemia: Secondary | ICD-10-CM | POA: Diagnosis not present

## 2020-08-04 DIAGNOSIS — E1122 Type 2 diabetes mellitus with diabetic chronic kidney disease: Secondary | ICD-10-CM | POA: Diagnosis not present

## 2020-08-04 DIAGNOSIS — I130 Hypertensive heart and chronic kidney disease with heart failure and stage 1 through stage 4 chronic kidney disease, or unspecified chronic kidney disease: Secondary | ICD-10-CM | POA: Diagnosis not present

## 2020-08-04 DIAGNOSIS — N183 Chronic kidney disease, stage 3 unspecified: Secondary | ICD-10-CM | POA: Diagnosis not present

## 2020-08-04 DIAGNOSIS — I5030 Unspecified diastolic (congestive) heart failure: Secondary | ICD-10-CM | POA: Diagnosis not present

## 2020-08-04 DIAGNOSIS — K922 Gastrointestinal hemorrhage, unspecified: Secondary | ICD-10-CM | POA: Diagnosis not present

## 2020-08-04 DIAGNOSIS — D500 Iron deficiency anemia secondary to blood loss (chronic): Secondary | ICD-10-CM | POA: Diagnosis not present

## 2020-08-04 DIAGNOSIS — K31819 Angiodysplasia of stomach and duodenum without bleeding: Secondary | ICD-10-CM | POA: Diagnosis not present

## 2020-08-04 DIAGNOSIS — K2100 Gastro-esophageal reflux disease with esophagitis, without bleeding: Secondary | ICD-10-CM | POA: Diagnosis not present

## 2020-08-05 ENCOUNTER — Encounter (HOSPITAL_COMMUNITY): Payer: Self-pay | Admitting: Physician Assistant

## 2020-08-05 ENCOUNTER — Other Ambulatory Visit: Payer: Self-pay

## 2020-08-05 ENCOUNTER — Ambulatory Visit (HOSPITAL_COMMUNITY)
Admit: 2020-08-05 | Discharge: 2020-08-05 | Disposition: A | Discharge status: Home / Self Care | Payer: Medicare PPO | Attending: Physician Assistant | Admitting: Physician Assistant

## 2020-08-05 VITALS — BP 110/60 | HR 81 | Ht 64.0 in | Wt 148.0 lb

## 2020-08-05 DIAGNOSIS — I130 Hypertensive heart and chronic kidney disease with heart failure and stage 1 through stage 4 chronic kidney disease, or unspecified chronic kidney disease: Secondary | ICD-10-CM | POA: Diagnosis not present

## 2020-08-05 DIAGNOSIS — D6869 Other thrombophilia: Secondary | ICD-10-CM | POA: Insufficient documentation

## 2020-08-05 DIAGNOSIS — E878 Other disorders of electrolyte and fluid balance, not elsewhere classified: Secondary | ICD-10-CM | POA: Diagnosis not present

## 2020-08-05 DIAGNOSIS — Z9079 Acquired absence of other genital organ(s): Secondary | ICD-10-CM | POA: Insufficient documentation

## 2020-08-05 DIAGNOSIS — I4819 Other persistent atrial fibrillation: Secondary | ICD-10-CM | POA: Insufficient documentation

## 2020-08-05 DIAGNOSIS — N183 Chronic kidney disease, stage 3 unspecified: Secondary | ICD-10-CM | POA: Diagnosis not present

## 2020-08-05 DIAGNOSIS — E1122 Type 2 diabetes mellitus with diabetic chronic kidney disease: Secondary | ICD-10-CM | POA: Diagnosis not present

## 2020-08-05 DIAGNOSIS — Z79899 Other long term (current) drug therapy: Secondary | ICD-10-CM | POA: Insufficient documentation

## 2020-08-05 DIAGNOSIS — I739 Peripheral vascular disease, unspecified: Secondary | ICD-10-CM | POA: Diagnosis not present

## 2020-08-05 DIAGNOSIS — Z96652 Presence of left artificial knee joint: Secondary | ICD-10-CM | POA: Diagnosis not present

## 2020-08-05 DIAGNOSIS — I5032 Chronic diastolic (congestive) heart failure: Secondary | ICD-10-CM | POA: Insufficient documentation

## 2020-08-05 DIAGNOSIS — D509 Iron deficiency anemia, unspecified: Secondary | ICD-10-CM | POA: Diagnosis not present

## 2020-08-05 DIAGNOSIS — Z95818 Presence of other cardiac implants and grafts: Secondary | ICD-10-CM | POA: Diagnosis not present

## 2020-08-05 DIAGNOSIS — Z7901 Long term (current) use of anticoagulants: Secondary | ICD-10-CM | POA: Insufficient documentation

## 2020-08-05 DIAGNOSIS — I509 Heart failure, unspecified: Secondary | ICD-10-CM | POA: Diagnosis not present

## 2020-08-05 DIAGNOSIS — Q208 Other congenital malformations of cardiac chambers and connections: Secondary | ICD-10-CM | POA: Diagnosis not present

## 2020-08-05 DIAGNOSIS — Z794 Long term (current) use of insulin: Secondary | ICD-10-CM | POA: Diagnosis not present

## 2020-08-05 DIAGNOSIS — I4891 Unspecified atrial fibrillation: Secondary | ICD-10-CM | POA: Diagnosis not present

## 2020-08-05 DIAGNOSIS — Z8719 Personal history of other diseases of the digestive system: Secondary | ICD-10-CM | POA: Diagnosis not present

## 2020-08-05 DIAGNOSIS — Q2733 Arteriovenous malformation of digestive system vessel: Secondary | ICD-10-CM | POA: Diagnosis not present

## 2020-08-05 NOTE — Progress Notes (Signed)
Primary Care Physician: Leighton Ruff, MD Primary Cardiologist: Dr Marlou Porch Primary Electrophysiologist: Dr Quentin Ore Referring Physician: Dr Donaciano Eva is a 79 y.o. female with a history of CKD stage III, DM, HTN, PVD, and prior GI bleeding who presents for follow up in the [facility]. Patient underwent Watchman implant with Dr Quentin Ore on 07/01/20 for her CHADS2VASC score of 6 and h/o GI bleeding. During the procedure there was a slight increase in the fluid in the transverse sinus noted on transesophageal echocardiogram.  This fluid was monitored for over 30 minutes during the procedure while completely heparinized without any change.  After the procedure was over she was monitored overnight and then had a repeat echo the next morning showing no effusion. She was admitted 11/15-11/18/21 with an acute GI bleed. Upper endoscopy revealed moderately severe resolving distal esophagitis and an area of semiclotted blood in the gastric antrum. Her bleeding resolved and her Eliquis and ASA were resumed. She was readmitted 11/25-11/30/21 with acute CHF. Chest x-ray showed left-sided pleural effusion, elevated BNP, and atrial fibrillation with rapid ventricular response on ECG. She was started on digoxin for rate control but this was discontinued prior to discharge. Patient did develop nosebleeds after discharge and her ASA was stopped.   On follow up today, she reports that she feels better today than she has for several weeks. Her strength is returning but she isn't quite back to her baseline yet. She denies any SOB, lower extremity edema, or bleeding issues since stopping ASA. She had CBC/cmet/mag with PCP earlier today. Will request these labs to be faxed.   Today, she denies symptoms of palpitations, chest pain, shortness of breath, orthopnea, PND, lower extremity edema, dizziness, presyncope, syncope, snoring, daytime somnolence, bleeding, or neurologic  sequela. The patient is tolerating medications without difficulties and is otherwise without complaint today.    Atrial Fibrillation Risk Factors:  she does not have symptoms or diagnosis of sleep apnea. she does not have a history of rheumatic fever.   she has a BMI of Body mass index is 25.4 kg/m.Marland Kitchen Filed Weights   08/05/20 1415  Weight: 67.1 kg    Family History  Problem Relation Age of Onset  . Lung cancer Mother   . Heart attack Father   . Colon cancer Neg Hx   . Stroke Neg Hx   . Migraines Neg Hx      Atrial Fibrillation Management history:  Previous antiarrhythmic drugs: none Previous cardioversions: none Previous ablations: none CHADS2VASC score: 6 Anticoagulation history: Eliquis   Past Medical History:  Diagnosis Date  . Acute GI bleeding 05/13/2015  . Anxiety   . Arthritis   . Asthma   . Atrial fibrillation ([place]) 10/29/2017  . Bakers cyst, left   . Chronic kidney disease    stage 3  . Constipation   . Diabetes mellitus    type 2  . Dysrhythmia   . GERD (gastroesophageal reflux disease)   . Heart murmur   . History of cellulitis   . History of dizziness   . History of hiatal hernia    tiny 06/15/2016  . Hypertension   . Iron deficiency anemia   . Peripheral vascular disease ([place])   . PONV (postoperative nausea and vomiting)   . Pulmonary nodule    Past Surgical History:  Procedure Laterality Date  . APPENDECTOMY  1952  . BIOPSY  10/16/2018   Procedure: BIOPSY;  Surgeon: Clarene Essex, MD;  Location: [facility];  Service: Endoscopy;;  . BIOPSY  11/26/2019   Procedure: BIOPSY;  Surgeon: Clarene Essex, MD;  Location: [facility];  Service: Endoscopy;;  . BIOPSY  07/14/2020   Procedure: BIOPSY;  Surgeon: Ronald Lobo, MD;  Location: [facility];  Service: Endoscopy;;  . COLONOSCOPY W/ POLYPECTOMY  05/15/2013  . ESOPHAGOGASTRODUODENOSCOPY (EGD) WITH PROPOFOL N/A 06/15/2016   Procedure: ESOPHAGOGASTRODUODENOSCOPY (EGD) WITH PROPOFOL;   Surgeon: Clarene Essex, MD;  Location: [facility];  Service: Endoscopy;  Laterality: N/A;  . ESOPHAGOGASTRODUODENOSCOPY (EGD) WITH PROPOFOL N/A 10/16/2018   Procedure: ESOPHAGOGASTRODUODENOSCOPY (EGD) WITH PROPOFOL;  Surgeon: Clarene Essex, MD;  Location: [facility];  Service: Endoscopy;  Laterality: N/A;  . ESOPHAGOGASTRODUODENOSCOPY (EGD) WITH PROPOFOL N/A 11/26/2019   Procedure: ESOPHAGOGASTRODUODENOSCOPY (EGD) WITH PROPOFOL;  Surgeon: Clarene Essex, MD;  Location: [facility];  Service: Endoscopy;  Laterality: N/A;  . ESOPHAGOGASTRODUODENOSCOPY (EGD) WITH PROPOFOL N/A 07/14/2020   Procedure: ESOPHAGOGASTRODUODENOSCOPY (EGD) WITH PROPOFOL;  Surgeon: Ronald Lobo, MD;  Location: [facility];  Service: Endoscopy;  Laterality: N/A;  . EYE SURGERY Bilateral    cataracts removed  . GI RADIOFREQUENCY ABLATION  10/16/2018   Procedure: GI RADIOFREQUENCY ABLATION;  Surgeon: Clarene Essex, MD;  Location: [facility];  Service: Endoscopy;;  . GI RADIOFREQUENCY ABLATION N/A 11/26/2019   Procedure: GI RADIOFREQUENCY ABLATION;  Surgeon: Clarene Essex, MD;  Location: [facility];  Service: Endoscopy;  Laterality: N/A;  . HAND SURGERY Left 2010   operated on index and ring finger  . HOT HEMOSTASIS N/A 07/14/2020   Procedure: HOT HEMOSTASIS (ARGON PLASMA COAGULATION/BICAP);  Surgeon: Ronald Lobo, MD;  Location: [facility];  Service: Endoscopy;  Laterality: N/A;  . INCONTINENCE SURGERY    . JOINT REPLACEMENT Right    knee  . KNEE ARTHROSCOPY  2003 2005   right knee  . KYPHOPLASTY N/A 01/29/2020   Procedure: T8 KYPHOPLASTY;  Surgeon: Melina Schools, MD;  Location: [facility];  Service: Orthopedics;  Laterality: N/A;  60 mins Local with [facility]  . LEFT ATRIAL APPENDAGE OCCLUSION N/A 07/01/2020   Procedure: LEFT ATRIAL APPENDAGE OCCLUSION;  Surgeon: Vickie Epley, MD;  Location: [facility];  Service: Cardiovascular;  Laterality: N/A;  . mass fallopian tube    . SPINAL CORD STIMULATOR BATTERY  EXCHANGE N/A 09/09/2015   Procedure: SPINAL CORD STIMULATOR BATTERY EXCHANGE;  Surgeon: Melina Schools, MD;  Location: [facility];  Service: Orthopedics;  Laterality: N/A;  . TOTAL KNEE ARTHROPLASTY Left 11/05/2017   Procedure: LEFT TOTAL KNEE ARTHROPLASTY;  Surgeon: Paralee Cancel, MD;  Location: [facility];  Service: Orthopedics;  Laterality: Left;  Adductor Block  . VAGINAL HYSTERECTOMY  1977   1 ovary removed    Current Outpatient Medications  Medication Sig Dispense Refill  . acetaminophen (TYLENOL) 500 MG tablet Take 1,000 mg by mouth every 8 (eight) hours as needed for mild pain, moderate pain or headache.     . albuterol (PROAIR HFA) 108 (90 BASE) MCG/ACT inhaler Inhale 2 puffs into the lungs every 4 (four) hours as needed for wheezing or shortness of breath. 1 Inhaler 3  . Alcohol Swabs 70 % PADS See admin instructions.    . ALPRAZolam (XANAX) 0.25 MG tablet Take 0.25 mg by mouth at bedtime.     . Continuous Blood Gluc Sensor (FREESTYLE LIBRE 14 DAY SENSOR) MISC Inject 1 patch into the skin every 14 (fourteen) days.    . diphenhydrAMINE (BENADRYL) 25 MG tablet Take 25 mg by mouth daily as needed for allergies.    Marland Kitchen docusate sodium (  COLACE) 100 MG capsule Take 100 mg by mouth 2 (two) times daily as needed for mild constipation.     . DROPLET PEN NEEDLES 32G X 4 MM MISC     . ELIQUIS 5 MG TABS tablet TAKE 1 TABLET TWICE DAILY 180 tablet 2  . esomeprazole (NEXIUM) 40 MG capsule Take 40 mg by mouth in the morning and at bedtime.   0  . fluticasone (FLONASE) 50 MCG/ACT nasal spray Place 1 spray into both nostrils daily as needed for allergies.     . furosemide (LASIX) 40 MG tablet Take 1 tablet (40 mg total) by mouth daily. 30 tablet 2  . gabapentin (NEURONTIN) 300 MG capsule Take 600 mg by mouth in the morning, at noon, and at bedtime.     . insulin glargine (LANTUS) 100 UNIT/ML Solostar Pen Inject 30 Units into the skin at bedtime.     . Iron-FA-B Cmp-C-Biot-Probiotic (FUSION PLUS) CAPS Take 1  capsule by mouth every evening.     . magnesium oxide (MAG-OX) 400 (241.3 Mg) MG tablet Take 0.5 tablets (200 mg total) by mouth 2 (two) times daily. 30 tablet 0  . metoprolol tartrate (LOPRESSOR) 100 MG tablet Take 1 tablet (100 mg total) by mouth 2 (two) times daily. 60 tablet 0  . Multiple Vitamins-Minerals (PRESERVISION AREDS 2 PO) Take 1 capsule by mouth 2 (two) times daily.     Marland Kitchen NOVOLOG FLEXPEN 100 UNIT/ML FlexPen Inject 0-16 Units into the skin See admin instructions. Inject 0-16 units into the skin, PER SLIDING SCALE, in the morning, noon, and bedtime    . ondansetron (ZOFRAN) 4 MG tablet Take 4 mg by mouth every 8 (eight) hours as needed for nausea or vomiting.    . OXYGEN Inhale 2.5 L/min into the lungs as needed (for shortness of breath).    . Pitavastatin Calcium (LIVALO) 1 MG TABS Take 1 mg by mouth at bedtime.    . potassium chloride SA (KLOR-CON) 20 MEQ tablet Take 2 tablets (40 mEq total) by mouth daily. 60 tablet 1  . sodium chloride (OCEAN) 0.65 % SOLN nasal spray Place 1 spray into both nostrils as needed for congestion.    Marland Kitchen venlafaxine (EFFEXOR) 37.5 MG tablet Take 37.5 mg by mouth 2 (two) times daily.     No current facility-administered medications for this encounter.    Allergies  Allergen Reactions  . Vasotec Shortness Of Breath and Other (See Comments)    Wheezing, also  . Atorvastatin Other (See Comments)    Leg cramps   . Codeine Nausea And Vomiting  . Cymbalta [Duloxetine Hcl] Itching  . Lansoprazole Itching, Swelling, Rash and Other (See Comments)    Redness, swelling of mouth. During 07/12/20-07/15/20 pt tolerated IV protonix infusion with no reactions.  . Morphine And Related Itching  . Sulfate Itching  . Allevyn Adhesive [Wound Dressings]     Other reaction(s): rash  . Amitriptyline Hcl     Other reaction(s): sleepwalking  . Escitalopram Oxalate     Other reaction(s): itching  . Iron     Other reaction(s): constipated  . Sertraline Hcl     Other  reaction(s): itching  . Tramadol Itching  . Adhesive [Tape] Rash  . Scopolamine Rash    Social History   Socioeconomic History  . Marital status: Married    Spouse name: Charlotte Crumb  . Number of children: 2  . Years of education: 89  . Highest education level: Not on file  Occupational History  Comment: retired Pharmacist, hospital, IT sales professional  Tobacco Use  . Smoking status: Never Smoker  . Smokeless tobacco: Never Used  Vaping Use  . Vaping Use: Never used  Substance and Sexual Activity  . Alcohol use: No  . Drug use: No  . Sexual activity: Never    Birth control/protection: Surgical  Other Topics Concern  . Not on file  Social History Narrative   Lives with husband   Caffeine use- none   Social Determinants of Health   Financial Resource Strain: Not on file  Food Insecurity: Not on file  Transportation Needs: Not on file  Physical Activity: Not on file  Stress: Not on file  Social Connections: Not on file  Intimate Partner Violence: Not on file     ROS- All systems are reviewed and negative except as per the HPI above.  Physical Exam: Vitals:   08/05/20 1415  BP: 110/60  Pulse: 81  Weight: 67.1 kg  Height: 5\' 4"  (1.626 m)    GEN- The patient is well appearing elderly female, alert and oriented x 3 today.   Head- normocephalic, atraumatic Eyes-  Sclera clear, conjunctiva pink Ears- hearing intact Oropharynx- clear Neck- supple  Lungs- Clear to ausculation bilaterally, normal work of breathing Heart- irregular rate and rhythm, no murmurs, rubs or gallops  GI- soft, NT, ND, + BS Extremities- no clubbing, cyanosis, or edema MS- no significant deformity or atrophy Skin- no rash or lesion Psych- euthymic mood, full affect Neuro- strength and sensation are intact  Wt Readings from Last 3 Encounters:  08/05/20 67.1 kg  07/27/20 67 kg  07/14/20 71.4 kg    EKG today demonstrates afib HR 81, QRS 80, QTc 401  Echo 07/13/20 demonstrated  1. Left ventricular  ejection fraction, by estimation, is 55 to 60%. The  left ventricle has normal function. The left ventricle has no regional  wall motion abnormalities. There is mild asymmetric left ventricular  hypertrophy of the posterior-lateral  segment. Left ventricular diastolic parameters are indeterminate. Elevated  left ventricular end-diastolic pressure.  2. Right ventricular systolic function is mildly reduced. The right  ventricular size is mildly enlarged. There is normal pulmonary artery  systolic pressure. The estimated right ventricular systolic pressure is  19.6 mmHg.  3. Left atrial size was moderately dilated.  4. Right atrial size was severely dilated.  5. The mitral valve is abnormal. Trivial mitral valve regurgitation.  Moderate mitral annular calcification.  6. Tricuspid valve regurgitation is mild to moderate.  7. The aortic valve is grossly normal. Aortic valve regurgitation is not  visualized. No aortic stenosis is present.  8. The inferior vena cava is normal in size with greater than 50%  respiratory variability, suggesting right atrial pressure of 3 mmHg.  9. S/p 27 mm Watchman FLX 07/01/20, which is not well visualized or  assessed on this transthoracic echo.   Epic records are reviewed at length today  CHA2DS2-VASc Score = 6  The patient's score is based upon: CHF History: Yes HTN History: Yes Diabetes History: Yes Stroke History: No Vascular Disease History: No Age Score: 2 Gender Score: 1      ASSESSMENT AND PLAN: 1. Persistent Atrial Fibrillation (ICD10:  I48.19) The patient's CHA2DS2-VASc score is 6, indicating a 9.7% annual risk of stroke.   S/p Watchman implant 07/01/20 Patient in rate controlled afib today.  Continue Eliquis 5 mg BID. ASA stopped 2/2 bleeding (phone note 07/29/20) Will request CBC/cmet/mag drawn at PCP office earlier today. Continue Lopressor 100 mg BID  TEE scheduled for 12/22. Patient has instructions for procedure.   2.  Secondary Hypercoagulable State (ICD10:  D68.69) The patient is at significant risk for stroke/thromboembolism based upon her CHA2DS2-VASc Score of 6.  Continue Apixaban (Eliquis).   3. Chronic diastolic CHF No signs or symptoms of fluid overload today.  4. HTN Stable, no changes today.   Follow up with Dr Marlou Porch and Dr Quentin Ore as scheduled.    [facility] [address] [place], [place] [address] [phone] 08/05/2020 4:07 PM

## 2020-08-06 ENCOUNTER — Telehealth (HOSPITAL_COMMUNITY): Payer: Self-pay | Admitting: *Deleted

## 2020-08-06 DIAGNOSIS — I130 Hypertensive heart and chronic kidney disease with heart failure and stage 1 through stage 4 chronic kidney disease, or unspecified chronic kidney disease: Secondary | ICD-10-CM | POA: Diagnosis not present

## 2020-08-06 DIAGNOSIS — K2100 Gastro-esophageal reflux disease with esophagitis, without bleeding: Secondary | ICD-10-CM | POA: Diagnosis not present

## 2020-08-06 DIAGNOSIS — N183 Chronic kidney disease, stage 3 unspecified: Secondary | ICD-10-CM | POA: Diagnosis not present

## 2020-08-06 DIAGNOSIS — D500 Iron deficiency anemia secondary to blood loss (chronic): Secondary | ICD-10-CM | POA: Diagnosis not present

## 2020-08-06 DIAGNOSIS — K922 Gastrointestinal hemorrhage, unspecified: Secondary | ICD-10-CM | POA: Diagnosis not present

## 2020-08-06 DIAGNOSIS — I5030 Unspecified diastolic (congestive) heart failure: Secondary | ICD-10-CM | POA: Diagnosis not present

## 2020-08-06 DIAGNOSIS — D62 Acute posthemorrhagic anemia: Secondary | ICD-10-CM | POA: Diagnosis not present

## 2020-08-06 DIAGNOSIS — E1122 Type 2 diabetes mellitus with diabetic chronic kidney disease: Secondary | ICD-10-CM | POA: Diagnosis not present

## 2020-08-06 DIAGNOSIS — K31819 Angiodysplasia of stomach and duodenum without bleeding: Secondary | ICD-10-CM | POA: Diagnosis not present

## 2020-08-06 MED ORDER — FUROSEMIDE 40 MG PO TABS
20.0000 mg | ORAL_TABLET | Freq: Every day | ORAL | 2 refills | Status: DC
Start: 2020-08-06 — End: 2020-08-13

## 2020-08-06 NOTE — Telephone Encounter (Addendum)
Per labs received from Dr. Drema Dallas office on 12/09 Creat up to 1.5 (from0.9) Per Adline Peals PA decrease lasix to 20mg  a day.  Pt instructed of medication change - to watch weight/swelling if noticed increase will let our office know.

## 2020-08-10 ENCOUNTER — Ambulatory Visit: Payer: Medicare PPO | Admitting: Cardiology

## 2020-08-10 DIAGNOSIS — N183 Chronic kidney disease, stage 3 unspecified: Secondary | ICD-10-CM | POA: Diagnosis not present

## 2020-08-10 DIAGNOSIS — K31819 Angiodysplasia of stomach and duodenum without bleeding: Secondary | ICD-10-CM | POA: Diagnosis not present

## 2020-08-10 DIAGNOSIS — K922 Gastrointestinal hemorrhage, unspecified: Secondary | ICD-10-CM | POA: Diagnosis not present

## 2020-08-10 DIAGNOSIS — D62 Acute posthemorrhagic anemia: Secondary | ICD-10-CM | POA: Diagnosis not present

## 2020-08-10 DIAGNOSIS — E1122 Type 2 diabetes mellitus with diabetic chronic kidney disease: Secondary | ICD-10-CM | POA: Diagnosis not present

## 2020-08-10 DIAGNOSIS — D500 Iron deficiency anemia secondary to blood loss (chronic): Secondary | ICD-10-CM | POA: Diagnosis not present

## 2020-08-10 DIAGNOSIS — I5030 Unspecified diastolic (congestive) heart failure: Secondary | ICD-10-CM | POA: Diagnosis not present

## 2020-08-10 DIAGNOSIS — K2100 Gastro-esophageal reflux disease with esophagitis, without bleeding: Secondary | ICD-10-CM | POA: Diagnosis not present

## 2020-08-10 DIAGNOSIS — I130 Hypertensive heart and chronic kidney disease with heart failure and stage 1 through stage 4 chronic kidney disease, or unspecified chronic kidney disease: Secondary | ICD-10-CM | POA: Diagnosis not present

## 2020-08-11 DIAGNOSIS — I5030 Unspecified diastolic (congestive) heart failure: Secondary | ICD-10-CM | POA: Diagnosis not present

## 2020-08-11 DIAGNOSIS — N183 Chronic kidney disease, stage 3 unspecified: Secondary | ICD-10-CM | POA: Diagnosis not present

## 2020-08-11 DIAGNOSIS — D62 Acute posthemorrhagic anemia: Secondary | ICD-10-CM | POA: Diagnosis not present

## 2020-08-11 DIAGNOSIS — K31819 Angiodysplasia of stomach and duodenum without bleeding: Secondary | ICD-10-CM | POA: Diagnosis not present

## 2020-08-11 DIAGNOSIS — K922 Gastrointestinal hemorrhage, unspecified: Secondary | ICD-10-CM | POA: Diagnosis not present

## 2020-08-11 DIAGNOSIS — E1122 Type 2 diabetes mellitus with diabetic chronic kidney disease: Secondary | ICD-10-CM | POA: Diagnosis not present

## 2020-08-11 DIAGNOSIS — K2100 Gastro-esophageal reflux disease with esophagitis, without bleeding: Secondary | ICD-10-CM | POA: Diagnosis not present

## 2020-08-11 DIAGNOSIS — D500 Iron deficiency anemia secondary to blood loss (chronic): Secondary | ICD-10-CM | POA: Diagnosis not present

## 2020-08-11 DIAGNOSIS — I130 Hypertensive heart and chronic kidney disease with heart failure and stage 1 through stage 4 chronic kidney disease, or unspecified chronic kidney disease: Secondary | ICD-10-CM | POA: Diagnosis not present

## 2020-08-13 ENCOUNTER — Other Ambulatory Visit: Payer: Self-pay

## 2020-08-13 ENCOUNTER — Encounter: Payer: Self-pay | Admitting: Cardiology

## 2020-08-13 ENCOUNTER — Ambulatory Visit (INDEPENDENT_AMBULATORY_CARE_PROVIDER_SITE_OTHER): Payer: Medicare PPO | Admitting: Cardiology

## 2020-08-13 VITALS — BP 120/60 | HR 85 | Ht 64.0 in | Wt 149.0 lb

## 2020-08-13 DIAGNOSIS — Z79899 Other long term (current) drug therapy: Secondary | ICD-10-CM | POA: Diagnosis not present

## 2020-08-13 DIAGNOSIS — Z8719 Personal history of other diseases of the digestive system: Secondary | ICD-10-CM

## 2020-08-13 DIAGNOSIS — I10 Essential (primary) hypertension: Secondary | ICD-10-CM | POA: Diagnosis not present

## 2020-08-13 DIAGNOSIS — I4819 Other persistent atrial fibrillation: Secondary | ICD-10-CM | POA: Diagnosis not present

## 2020-08-13 DIAGNOSIS — I480 Paroxysmal atrial fibrillation: Secondary | ICD-10-CM

## 2020-08-13 MED ORDER — FUROSEMIDE 40 MG PO TABS: 40.0000 mg | ORAL_TABLET | Freq: Every day | ORAL | 3 refills | Status: DC

## 2020-08-13 NOTE — H&P (View-Only) (Signed)
Cardiology Office Note:    Date:  08/13/2020   ID:  [name], DOB [birth year], MRN [id number]  PCP:  Leighton Ruff, MD  [facility] Cardiologist:  Candee Furbish, MD  [facility] Electrophysiologist:  Vickie Epley, MD   Referring MD: Leighton Ruff, MD     History of Present Illness:    [name] is a 79 y.o. female had watchman implant by Dr. Quentin Ore on 07/01/2020 here for follow-up, permanent atrial fibrillation, prior GI bleeding.  Has TEE soon with Dr. Johnsie Cancel to monitor watchman postop  Doing well.  Feels better than she has in a long time.  She did request that her Lasix be increased back from 20 up to 40.  I think this is reasonable.  Past Medical History:  Diagnosis Date  . Acute GI bleeding 05/13/2015  . Anxiety   . Arthritis   . Asthma   . Atrial fibrillation ([place]) 10/29/2017  . Bakers cyst, left   . Chronic kidney disease    stage 3  . Constipation   . Diabetes mellitus    type 2  . Dysrhythmia   . GERD (gastroesophageal reflux disease)   . Heart murmur   . History of cellulitis   . History of dizziness   . History of hiatal hernia    tiny 06/15/2016  . Hypertension   . Iron deficiency anemia   . Peripheral vascular disease ([place])   . PONV (postoperative nausea and vomiting)   . Pulmonary nodule     Past Surgical History:  Procedure Laterality Date  . APPENDECTOMY  1952  . BIOPSY  10/16/2018   Procedure: BIOPSY;  Surgeon: Clarene Essex, MD;  Location: [facility];  Service: Endoscopy;;  . BIOPSY  11/26/2019   Procedure: BIOPSY;  Surgeon: Clarene Essex, MD;  Location: [facility];  Service: Endoscopy;;  . BIOPSY  07/14/2020   Procedure: BIOPSY;  Surgeon: Ronald Lobo, MD;  Location: [facility];  Service: Endoscopy;;  . COLONOSCOPY W/ POLYPECTOMY  05/15/2013  . ESOPHAGOGASTRODUODENOSCOPY (EGD) WITH PROPOFOL N/A 06/15/2016   Procedure: ESOPHAGOGASTRODUODENOSCOPY (EGD) WITH PROPOFOL;  Surgeon: Clarene Essex, MD;  Location: [facility];  Service: Endoscopy;  Laterality: N/A;  . ESOPHAGOGASTRODUODENOSCOPY (EGD) WITH PROPOFOL N/A 10/16/2018   Procedure: ESOPHAGOGASTRODUODENOSCOPY (EGD) WITH PROPOFOL;  Surgeon: Clarene Essex, MD;  Location: [facility];  Service: Endoscopy;  Laterality: N/A;  . ESOPHAGOGASTRODUODENOSCOPY (EGD) WITH PROPOFOL N/A 11/26/2019   Procedure: ESOPHAGOGASTRODUODENOSCOPY (EGD) WITH PROPOFOL;  Surgeon: Clarene Essex, MD;  Location: [facility];  Service: Endoscopy;  Laterality: N/A;  . ESOPHAGOGASTRODUODENOSCOPY (EGD) WITH PROPOFOL N/A 07/14/2020   Procedure: ESOPHAGOGASTRODUODENOSCOPY (EGD) WITH PROPOFOL;  Surgeon: Ronald Lobo, MD;  Location: [facility];  Service: Endoscopy;  Laterality: N/A;  . EYE SURGERY Bilateral    cataracts removed  . GI RADIOFREQUENCY ABLATION  10/16/2018   Procedure: GI RADIOFREQUENCY ABLATION;  Surgeon: Clarene Essex, MD;  Location: [facility];  Service: Endoscopy;;  . GI RADIOFREQUENCY ABLATION N/A 11/26/2019   Procedure: GI RADIOFREQUENCY ABLATION;  Surgeon: Clarene Essex, MD;  Location: [facility];  Service: Endoscopy;  Laterality: N/A;  . HAND SURGERY Left 2010   operated on index and ring finger  . HOT HEMOSTASIS N/A 07/14/2020   Procedure: HOT HEMOSTASIS (ARGON PLASMA COAGULATION/BICAP);  Surgeon: Ronald Lobo, MD;  Location: [facility];  Service: Endoscopy;  Laterality: N/A;  . INCONTINENCE SURGERY    . JOINT REPLACEMENT Right    knee  . KNEE ARTHROSCOPY  2003 2005   right knee  .  KYPHOPLASTY N/A 01/29/2020   Procedure: T8 KYPHOPLASTY;  Surgeon: Melina Schools, MD;  Location: [facility];  Service: Orthopedics;  Laterality: N/A;  60 mins Local with [facility]  . LEFT ATRIAL APPENDAGE OCCLUSION N/A 07/01/2020   Procedure: LEFT ATRIAL APPENDAGE OCCLUSION;  Surgeon: Vickie Epley, MD;  Location: [facility];  Service: Cardiovascular;  Laterality: N/A;  . mass fallopian tube    . SPINAL CORD STIMULATOR BATTERY EXCHANGE N/A 09/09/2015   Procedure:  SPINAL CORD STIMULATOR BATTERY EXCHANGE;  Surgeon: Melina Schools, MD;  Location: [facility];  Service: Orthopedics;  Laterality: N/A;  . TOTAL KNEE ARTHROPLASTY Left 11/05/2017   Procedure: LEFT TOTAL KNEE ARTHROPLASTY;  Surgeon: Paralee Cancel, MD;  Location: [facility];  Service: Orthopedics;  Laterality: Left;  Adductor Block  . VAGINAL HYSTERECTOMY  1977   1 ovary removed    Current Medications: Current Meds  Medication Sig  . acetaminophen (TYLENOL) 500 MG tablet Take 1,000 mg by mouth every 8 (eight) hours as needed for mild pain, moderate pain or headache.   . albuterol (PROAIR HFA) 108 (90 BASE) MCG/ACT inhaler Inhale 2 puffs into the lungs every 4 (four) hours as needed for wheezing or shortness of breath.  . ALPRAZolam (XANAX) 0.25 MG tablet Take 0.25 mg by mouth at bedtime.   . Continuous Blood Gluc Sensor (FREESTYLE LIBRE 14 DAY SENSOR) MISC Inject 1 patch into the skin every 14 (fourteen) days.  . diphenhydrAMINE (BENADRYL) 25 MG tablet Take 25 mg by mouth daily as needed for allergies.  Marland Kitchen docusate sodium (COLACE) 100 MG capsule Take 100 mg by mouth 2 (two) times daily as needed for mild constipation.   . DROPLET PEN NEEDLES 32G X 4 MM MISC   . ELIQUIS 5 MG TABS tablet TAKE 1 TABLET TWICE DAILY  . esomeprazole (NEXIUM) 40 MG capsule Take 40 mg by mouth in the morning and at bedtime.   . fluticasone (FLONASE) 50 MCG/ACT nasal spray Place 1 spray into both nostrils daily as needed for allergies.   Marland Kitchen gabapentin (NEURONTIN) 300 MG capsule Take 600 mg by mouth in the morning, at noon, and at bedtime.   . insulin glargine (LANTUS) 100 UNIT/ML Solostar Pen Inject 30 Units into the skin at bedtime.   . Iron-FA-B Cmp-C-Biot-Probiotic (FUSION PLUS) CAPS Take 1 capsule by mouth every evening.   . magnesium oxide (MAG-OX) 400 (241.3 Mg) MG tablet Take 0.5 tablets (200 mg total) by mouth 2 (two) times daily.  . metoprolol tartrate (LOPRESSOR) 100 MG tablet Take 1 tablet (100 mg total) by mouth 2 (two)  times daily. (Patient taking differently: Take 200 mg by mouth 2 (two) times daily.)  . Multiple Vitamins-Minerals (PRESERVISION AREDS 2 PO) Take 1 capsule by mouth 2 (two) times daily.   Marland Kitchen NOVOLOG FLEXPEN 100 UNIT/ML FlexPen Inject 0-16 Units into the skin See admin instructions. Inject 0-16 units into the skin, PER SLIDING SCALE, in the morning, noon, and bedtime  . ondansetron (ZOFRAN) 4 MG tablet Take 4 mg by mouth every 8 (eight) hours as needed for nausea or vomiting.  . OXYGEN Inhale 2.5 L/min into the lungs as needed (for shortness of breath).  . Pitavastatin Calcium (LIVALO) 1 MG TABS Take 1 mg by mouth at bedtime.  . potassium chloride SA (KLOR-CON) 20 MEQ tablet Take 2 tablets (40 mEq total) by mouth daily.  . sodium chloride (OCEAN) 0.65 % SOLN nasal spray Place 1 spray into both nostrils as needed for congestion.  Marland Kitchen venlafaxine (  EFFEXOR) 37.5 MG tablet Take 37.5 mg by mouth 2 (two) times daily.  . [DISCONTINUED] furosemide (LASIX) 40 MG tablet Take 0.5 tablets (20 mg total) by mouth daily. (Patient taking differently: Take 40 mg by mouth daily.)     Allergies:   Vasotec, Atorvastatin, Codeine, Cymbalta [duloxetine hcl], Lansoprazole, Morphine and related, Sulfate, Allevyn adhesive [wound dressings], Amitriptyline hcl, Escitalopram oxalate, Iron, Sertraline hcl, Tramadol, Adhesive [tape], and Scopolamine   Social History   Socioeconomic History  . Marital status: Married    Spouse name: Charlotte Crumb  . Number of children: 2  . Years of education: 3  . Highest education level: Not on file  Occupational History    Comment: retired Pharmacist, hospital, IT sales professional  Tobacco Use  . Smoking status: Never Smoker  . Smokeless tobacco: Never Used  Vaping Use  . Vaping Use: Never used  Substance and Sexual Activity  . Alcohol use: No  . Drug use: No  . Sexual activity: Never    Birth control/protection: Surgical  Other Topics Concern  . Not on file  Social History Narrative   Lives with  husband   Caffeine use- none   Social Determinants of Health   Financial Resource Strain: Not on file  Food Insecurity: Not on file  Transportation Needs: Not on file  Physical Activity: Not on file  Stress: Not on file  Social Connections: Not on file     Family History: The patient's family history includes Heart attack in her father; Lung cancer in her mother. There is no history of Colon cancer, Stroke, or Migraines.  ROS:   Please see the history of present illness.    No bleeding no melena all other systems reviewed and are negative.  EKGs/Labs/Other Studies Reviewed:    The following studies were reviewed today: Hospital discharge summary from 07/27/2020    Recent Labs: 07/22/2020: ALT 21; B Natriuretic Peptide 347.4; TSH 0.549 07/27/2020: BUN 26; Creatinine, Ser 0.92; Hemoglobin 10.0; Magnesium 1.8; Platelets 366; Potassium 4.6; Sodium 137  Recent Lipid Panel No results found for: CHOL, TRIG, HDL, CHOLHDL, VLDL, LDLCALC, LDLDIRECT     Physical Exam:    VS:  BP 120/60 (BP Location: Left Arm, Patient Position: Sitting, Cuff Size: Normal)   Pulse 85   Ht 5\' 4"  (1.626 m)   Wt 149 lb (67.6 kg)   SpO2 95%   BMI 25.58 kg/m     Wt Readings from Last 3 Encounters:  08/13/20 149 lb (67.6 kg)  08/05/20 148 lb (67.1 kg)  07/27/20 147 lb 9.6 oz (67 kg)     GEN:  Well nourished, well developed in no acute distress HEENT: Normal NECK: No JVD; No carotid bruits LYMPHATICS: No lymphadenopathy CARDIAC: Irregularly irregular, no murmurs, rubs, gallops RESPIRATORY:  Clear to auscultation without rales, wheezing or rhonchi  ABDOMEN: Soft, non-tender, non-distended MUSCULOSKELETAL: Trace edema; No deformity  SKIN: Warm and dry NEUROLOGIC:  Alert and oriented x 3 PSYCHIATRIC:  Normal affect   ASSESSMENT:    1. Essential hypertension   2. Paroxysmal atrial fibrillation (HCC)   3. Medication management   4. Persistent atrial fibrillation ([place])   5. History of GI  bleed    PLAN:    In order of problems listed above:  Permanent atrial fibrillation -Doing well good rate control with metoprolol 100 twice a day.  She likes to take her second dose in the evening.  Excellent. -She is off of both digoxin as well as Cardizem.  Lower extremity edema -Continue with  Lasix 40 mg a day.  We increase this from 20. -Check basic metabolic profile and CBC in 1 month.  Chronic anticoagulation -Taking Eliquis post watchman.  Stopping at the direction of Dr. Quentin Ore when able.  TEE coming up.    Shared Decision Making/Informed Consent The risks [esophageal damage, perforation (1:10,000 risk), bleeding, pharyngeal hematoma as well as other potential complications associated with conscious sedation including aspiration, arrhythmia, respiratory failure and death], benefits (treatment guidance and diagnostic support) and alternatives of a transesophageal echocardiogram were discussed in detail with Ms. Rhem and she is willing to proceed.       Medication Adjustments/Labs and Tests Ordered: Current medicines are reviewed at length with the patient today.  Concerns regarding medicines are outlined above.  Orders Placed This Encounter  Procedures  . Basic metabolic panel  . CBC   Meds ordered this encounter  Medications  . furosemide (LASIX) 40 MG tablet    Sig: Take 1 tablet (40 mg total) by mouth daily.    Dispense:  90 tablet    Refill:  3    Patient Instructions  Medication Instructions:  Please take Furosemide 40 mg a day. Continue all other medications as listed.  *If you need a refill on your cardiac medications before your next appointment, please call your pharmacy*  Lab Work: Please have blood work in 1 month (CBC, BMP)  If you have labs (blood work) drawn today and your tests are completely normal, you will receive your results only by: Marland Kitchen MyChart Message (if you have MyChart) OR . A paper copy in the mail If you have any lab test that is  abnormal or we need to change your treatment, we will call you to review the results.  Follow-Up: At [facility], you and your health needs are our priority.  As part of our continuing mission to provide you with exceptional heart care, we have created designated Provider Care Teams.  These Care Teams include your primary Cardiologist (physician) and Advanced Practice Providers (APPs -  Physician Assistants and Nurse Practitioners) who all work together to provide you with the care you need, when you need it.  We recommend signing up for the patient portal called "MyChart".  Sign up information is provided on this After Visit Summary.  MyChart is used to connect with patients for Virtual Visits (Telemedicine).  Patients are able to view lab/test results, encounter notes, upcoming appointments, etc.  Non-urgent messages can be sent to your provider as well.   To learn more about what you can do with MyChart, go to [email or web address].    Your next appointment:   3 month(s)  The format for your next appointment:   In Person  Provider:   Candee Furbish, MD  Thank you for choosing [facility]!!         Signed, Candee Furbish, MD  08/13/2020 12:47 PM    [place]

## 2020-08-13 NOTE — Progress Notes (Signed)
Cardiology Office Note:    Date:  08/13/2020   ID:  [name], DOB [birth year], MRN [id number]  PCP:  Leighton Ruff, MD  [facility] Cardiologist:  Candee Furbish, MD  [facility] Electrophysiologist:  Vickie Epley, MD   Referring MD: Leighton Ruff, MD     History of Present Illness:    [name] is a 79 y.o. female had watchman implant by Dr. Quentin Ore on 07/01/2020 here for follow-up, permanent atrial fibrillation, prior GI bleeding.  Has TEE soon with Dr. Johnsie Cancel to monitor watchman postop  Doing well.  Feels better than she has in a long time.  She did request that her Lasix be increased back from 20 up to 40.  I think this is reasonable.  Past Medical History:  Diagnosis Date  . Acute GI bleeding 05/13/2015  . Anxiety   . Arthritis   . Asthma   . Atrial fibrillation ([place]) 10/29/2017  . Bakers cyst, left   . Chronic kidney disease    stage 3  . Constipation   . Diabetes mellitus    type 2  . Dysrhythmia   . GERD (gastroesophageal reflux disease)   . Heart murmur   . History of cellulitis   . History of dizziness   . History of hiatal hernia    tiny 06/15/2016  . Hypertension   . Iron deficiency anemia   . Peripheral vascular disease ([place])   . PONV (postoperative nausea and vomiting)   . Pulmonary nodule     Past Surgical History:  Procedure Laterality Date  . APPENDECTOMY  1952  . BIOPSY  10/16/2018   Procedure: BIOPSY;  Surgeon: Clarene Essex, MD;  Location: [facility];  Service: Endoscopy;;  . BIOPSY  11/26/2019   Procedure: BIOPSY;  Surgeon: Clarene Essex, MD;  Location: [facility];  Service: Endoscopy;;  . BIOPSY  07/14/2020   Procedure: BIOPSY;  Surgeon: Ronald Lobo, MD;  [facility]: [facility];  Service: Endoscopy;;  . COLONOSCOPY W/ POLYPECTOMY  05/15/2013  . ESOPHAGOGASTRODUODENOSCOPY (EGD) WITH PROPOFOL N/A 06/15/2016   Procedure: ESOPHAGOGASTRODUODENOSCOPY (EGD) WITH PROPOFOL;  Surgeon: Clarene Essex, MD;  [facility];  Service: Endoscopy;  Laterality: N/A;  . ESOPHAGOGASTRODUODENOSCOPY (EGD) WITH PROPOFOL N/A 10/16/2018   Procedure: ESOPHAGOGASTRODUODENOSCOPY (EGD) WITH PROPOFOL;  Surgeon: Clarene Essex, MD;  [facility];  Service: Endoscopy;  Laterality: N/A;  . ESOPHAGOGASTRODUODENOSCOPY (EGD) WITH PROPOFOL N/A 11/26/2019   Procedure: ESOPHAGOGASTRODUODENOSCOPY (EGD) WITH PROPOFOL;  Surgeon: Clarene Essex, MD;  [facility];  Service: Endoscopy;  Laterality: N/A;  . ESOPHAGOGASTRODUODENOSCOPY (EGD) WITH PROPOFOL N/A 07/14/2020   Procedure: ESOPHAGOGASTRODUODENOSCOPY (EGD) WITH PROPOFOL;  Surgeon: Ronald Lobo, MD;  [facility];  Service: Endoscopy;  Laterality: N/A;  . EYE SURGERY Bilateral    cataracts removed  . GI RADIOFREQUENCY ABLATION  10/16/2018   Procedure: GI RADIOFREQUENCY ABLATION;  Surgeon: Clarene Essex, MD;  [facility];  Service: Endoscopy;;  . GI RADIOFREQUENCY ABLATION N/A 11/26/2019   Procedure: GI RADIOFREQUENCY ABLATION;  Surgeon: Clarene Essex, MD;  [facility];  Service: Endoscopy;  Laterality: N/A;  . HAND SURGERY Left 2010   operated on index and ring finger  . HOT HEMOSTASIS N/A 07/14/2020   Procedure: HOT HEMOSTASIS (ARGON PLASMA COAGULATION/BICAP);  Surgeon: Ronald Lobo, MD;  Location: [facility];  Service: Endoscopy;  Laterality: N/A;  . INCONTINENCE SURGERY    . JOINT REPLACEMENT Right    knee  . KNEE ARTHROSCOPY  2003 2005   right knee  .  KYPHOPLASTY N/A 01/29/2020   Procedure: T8 KYPHOPLASTY;  Surgeon: Melina Schools, MD;  Location: [place];  Service: Orthopedics;  Laterality: N/A;  60 mins Local with [facility]  . LEFT ATRIAL APPENDAGE OCCLUSION N/A 07/01/2020   Procedure: LEFT ATRIAL APPENDAGE OCCLUSION;  Surgeon: Vickie Epley, MD;  Location: [facility];  Service: Cardiovascular;  Laterality: N/A;  . mass fallopian tube    . SPINAL CORD STIMULATOR BATTERY EXCHANGE N/A 09/09/2015   Procedure:  SPINAL CORD STIMULATOR BATTERY EXCHANGE;  Surgeon: Melina Schools, MD;  Location: [place];  Service: Orthopedics;  Laterality: N/A;  . TOTAL KNEE ARTHROPLASTY Left 11/05/2017   Procedure: LEFT TOTAL KNEE ARTHROPLASTY;  Surgeon: Paralee Cancel, MD;  [facility]: [facility];  Service: Orthopedics;  Laterality: Left;  Adductor Block  . VAGINAL HYSTERECTOMY  1977   1 ovary removed    Current Medications: Current Meds  Medication Sig  . acetaminophen (TYLENOL) 500 MG tablet Take 1,000 mg by mouth every 8 (eight) hours as needed for mild pain, moderate pain or headache.   . albuterol (PROAIR HFA) 108 (90 BASE) MCG/ACT inhaler Inhale 2 puffs into the lungs every 4 (four) hours as needed for wheezing or shortness of breath.  . ALPRAZolam (XANAX) 0.25 MG tablet Take 0.25 mg by mouth at bedtime.   . Continuous Blood Gluc Sensor (FREESTYLE LIBRE 14 DAY SENSOR) MISC Inject 1 patch into the skin every 14 (fourteen) days.  . diphenhydrAMINE (BENADRYL) 25 MG tablet Take 25 mg by mouth daily as needed for allergies.  Marland Kitchen docusate sodium (COLACE) 100 MG capsule Take 100 mg by mouth 2 (two) times daily as needed for mild constipation.   . DROPLET PEN NEEDLES 32G X 4 MM MISC   . ELIQUIS 5 MG TABS tablet TAKE 1 TABLET TWICE DAILY  . esomeprazole (NEXIUM) 40 MG capsule Take 40 mg by mouth in the morning and at bedtime.   . fluticasone (FLONASE) 50 MCG/ACT nasal spray Place 1 spray into both nostrils daily as needed for allergies.   Marland Kitchen gabapentin (NEURONTIN) 300 MG capsule Take 600 mg by mouth in the morning, at noon, and at bedtime.   . insulin glargine (LANTUS) 100 UNIT/ML Solostar Pen Inject 30 Units into the skin at bedtime.   . Iron-FA-B Cmp-C-Biot-Probiotic (FUSION PLUS) CAPS Take 1 capsule by mouth every evening.   . magnesium oxide (MAG-OX) 400 (241.3 Mg) MG tablet Take 0.5 tablets (200 mg total) by mouth 2 (two) times daily.  . metoprolol tartrate (LOPRESSOR) 100 MG tablet Take 1 tablet (100 mg total) by mouth 2 (two)  times daily. (Patient taking differently: Take 200 mg by mouth 2 (two) times daily.)  . Multiple Vitamins-Minerals (PRESERVISION AREDS 2 PO) Take 1 capsule by mouth 2 (two) times daily.   Marland Kitchen NOVOLOG FLEXPEN 100 UNIT/ML FlexPen Inject 0-16 Units into the skin See admin instructions. Inject 0-16 units into the skin, PER SLIDING SCALE, in the morning, noon, and bedtime  . ondansetron (ZOFRAN) 4 MG tablet Take 4 mg by mouth every 8 (eight) hours as needed for nausea or vomiting.  . OXYGEN Inhale 2.5 L/min into the lungs as needed (for shortness of breath).  . Pitavastatin Calcium (LIVALO) 1 MG TABS Take 1 mg by mouth at bedtime.  . potassium chloride SA (KLOR-CON) 20 MEQ tablet Take 2 tablets (40 mEq total) by mouth daily.  . sodium chloride (OCEAN) 0.65 % SOLN nasal spray Place 1 spray into both nostrils as needed for congestion.  Marland Kitchen venlafaxine (  EFFEXOR) 37.5 MG tablet Take 37.5 mg by mouth 2 (two) times daily.  . [DISCONTINUED] furosemide (LASIX) 40 MG tablet Take 0.5 tablets (20 mg total) by mouth daily. (Patient taking differently: Take 40 mg by mouth daily.)     Allergies:   Vasotec, Atorvastatin, Codeine, Cymbalta [duloxetine hcl], Lansoprazole, Morphine and related, Sulfate, Allevyn adhesive [wound dressings], Amitriptyline hcl, Escitalopram oxalate, Iron, Sertraline hcl, Tramadol, Adhesive [tape], and Scopolamine   Social History   Socioeconomic History  . Marital status: Married    Spouse name: Charlotte Crumb  . Number of children: 2  . Years of education: 9  . Highest education level: Not on file  Occupational History    Comment: retired Pharmacist, hospital, IT sales professional  Tobacco Use  . Smoking status: Never Smoker  . Smokeless tobacco: Never Used  Vaping Use  . Vaping Use: Never used  Substance and Sexual Activity  . Alcohol use: No  . Drug use: No  . Sexual activity: Never    Birth control/protection: Surgical  Other Topics Concern  . Not on file  Social History Narrative   Lives with  husband   Caffeine use- none   Social Determinants of Health   Financial Resource Strain: Not on file  Food Insecurity: Not on file  Transportation Needs: Not on file  Physical Activity: Not on file  Stress: Not on file  Social Connections: Not on file     Family History: The patient's family history includes Heart attack in her father; Lung cancer in her mother. There is no history of Colon cancer, Stroke, or Migraines.  ROS:   Please see the history of present illness.    No bleeding no melena all other systems reviewed and are negative.  EKGs/Labs/Other Studies Reviewed:    The following studies were reviewed today: Hospital discharge summary from 07/27/2020    Recent Labs: 07/22/2020: ALT 21; B Natriuretic Peptide 347.4; TSH 0.549 07/27/2020: BUN 26; Creatinine, Ser 0.92; Hemoglobin 10.0; Magnesium 1.8; Platelets 366; Potassium 4.6; Sodium 137  Recent Lipid Panel No results found for: CHOL, TRIG, HDL, CHOLHDL, VLDL, LDLCALC, LDLDIRECT     Physical Exam:    VS:  BP 120/60 (BP Location: Left Arm, Patient Position: Sitting, Cuff Size: Normal)   Pulse 85   Ht 5\' 4"  (1.626 m)   Wt 149 lb (67.6 kg)   SpO2 95%   BMI 25.58 kg/m     Wt Readings from Last 3 Encounters:  08/13/20 149 lb (67.6 kg)  08/05/20 148 lb (67.1 kg)  07/27/20 147 lb 9.6 oz (67 kg)     GEN:  Well nourished, well developed in no acute distress HEENT: Normal NECK: No JVD; No carotid bruits LYMPHATICS: No lymphadenopathy CARDIAC: Irregularly irregular, no murmurs, rubs, gallops RESPIRATORY:  Clear to auscultation without rales, wheezing or rhonchi  ABDOMEN: Soft, non-tender, non-distended MUSCULOSKELETAL: Trace edema; No deformity  SKIN: Warm and dry NEUROLOGIC:  Alert and oriented x 3 PSYCHIATRIC:  Normal affect   ASSESSMENT:    1. Essential hypertension   2. Paroxysmal atrial fibrillation (HCC)   3. Medication management   4. Persistent atrial fibrillation ([place])   5. History of GI  bleed    PLAN:    In order of problems listed above:  Permanent atrial fibrillation -Doing well good rate control with metoprolol 100 twice a day.  She likes to take her second dose in the evening.  Excellent. -She is off of both digoxin as well as Cardizem.  Lower extremity edema -Continue with  Lasix 40 mg a day.  We increase this from 20. -Check basic metabolic profile and CBC in 1 month.  Chronic anticoagulation -Taking Eliquis post watchman.  Stopping at the direction of Dr. Quentin Ore when able.  TEE coming up.    Shared Decision Making/Informed Consent The risks [esophageal damage, perforation (1:10,000 risk), bleeding, pharyngeal hematoma as well as other potential complications associated with conscious sedation including aspiration, arrhythmia, respiratory failure and death], benefits (treatment guidance and diagnostic support) and alternatives of a transesophageal echocardiogram were discussed in detail with Ms. Carl and she is willing to proceed.       Medication Adjustments/Labs and Tests Ordered: Current medicines are reviewed at length with the patient today.  Concerns regarding medicines are outlined above.  Orders Placed This Encounter  Procedures  . Basic metabolic panel  . CBC   Meds ordered this encounter  Medications  . furosemide (LASIX) 40 MG tablet    Sig: Take 1 tablet (40 mg total) by mouth daily.    Dispense:  90 tablet    Refill:  3    Patient Instructions  Medication Instructions:  Please take Furosemide 40 mg a day. Continue all other medications as listed.  *If you need a refill on your cardiac medications before your next appointment, please call your pharmacy*  Lab Work: Please have blood work in 1 month (CBC, BMP)  If you have labs (blood work) drawn today and your tests are completely normal, you will receive your results only by: Marland Kitchen MyChart Message (if you have MyChart) OR . A paper copy in the mail If you have any lab test that is  abnormal or we need to change your treatment, we will call you to review the results.  Follow-Up: At [facility], you and your health needs are our priority.  As part of our continuing mission to provide you with exceptional heart care, we have created designated Provider Care Teams.  These Care Teams include your primary Cardiologist (physician) and Advanced Practice Providers (APPs -  Physician Assistants and Nurse Practitioners) who all work together to provide you with the care you need, when you need it.  We recommend signing up for the patient portal called "MyChart".  Sign up information is provided on this After Visit Summary.  MyChart is used to connect with patients for Virtual Visits (Telemedicine).  Patients are able to view lab/test results, encounter notes, upcoming appointments, etc.  Non-urgent messages can be sent to your provider as well.   To learn more about what you can do with MyChart, go to [email or web address].    Your next appointment:   3 month(s)  The format for your next appointment:   In Person  Provider:   Candee Furbish, MD  Thank you for choosing [facility]!!         Signed, Candee Furbish, MD  08/13/2020 12:47 PM    [place]

## 2020-08-13 NOTE — Patient Instructions (Signed)
Medication Instructions:  Please take Furosemide 40 mg a day. Continue all other medications as listed.  *If you need a refill on your cardiac medications before your next appointment, please call your pharmacy*  Lab Work: Please have blood work in 1 month (CBC, BMP)  If you have labs (blood work) drawn today and your tests are completely normal, you will receive your results only by: Marland Kitchen MyChart Message (if you have MyChart) OR . A paper copy in the mail If you have any lab test that is abnormal or we need to change your treatment, we will call you to review the results.  Follow-Up: At [facility], you and your health needs are our priority.  As part of our continuing mission to provide you with exceptional heart care, we have created designated Provider Care Teams.  These Care Teams include your primary Cardiologist (physician) and Advanced Practice Providers (APPs -  Physician Assistants and Nurse Practitioners) who all work together to provide you with the care you need, when you need it.  We recommend signing up for the patient portal called "MyChart".  Sign up information is provided on this After Visit Summary.  MyChart is used to connect with patients for Virtual Visits (Telemedicine).  Patients are able to view lab/test results, encounter notes, upcoming appointments, etc.  Non-urgent messages can be sent to your provider as well.   To learn more about what you can do with MyChart, go to [email or web address].    Your next appointment:   3 month(s)  The format for your next appointment:   In Person  Provider:   Candee Furbish, MD  Thank you for choosing [facility]!!

## 2020-08-14 DIAGNOSIS — I5020 Unspecified systolic (congestive) heart failure: Secondary | ICD-10-CM | POA: Diagnosis not present

## 2020-08-16 ENCOUNTER — Other Ambulatory Visit (HOSPITAL_COMMUNITY)
Admission: RE | Admit: 2020-08-16 | Discharge: 2020-08-16 | Disposition: A | Discharge status: Home / Self Care | Payer: Medicare PPO | Source: Ambulatory Visit | Attending: Cardiovascular Disease | Admitting: Cardiovascular Disease

## 2020-08-16 DIAGNOSIS — Z20822 Contact with and (suspected) exposure to covid-19: Secondary | ICD-10-CM | POA: Diagnosis not present

## 2020-08-16 DIAGNOSIS — Z01812 Encounter for preprocedural laboratory examination: Secondary | ICD-10-CM | POA: Diagnosis not present

## 2020-08-16 DIAGNOSIS — Q2733 Arteriovenous malformation of digestive system vessel: Secondary | ICD-10-CM | POA: Diagnosis not present

## 2020-08-16 DIAGNOSIS — K2100 Gastro-esophageal reflux disease with esophagitis, without bleeding: Secondary | ICD-10-CM | POA: Diagnosis not present

## 2020-08-16 LAB — SARS CORONAVIRUS 2 (TAT 6-24 HRS): SARS Coronavirus 2: NEGATIVE

## 2020-08-18 ENCOUNTER — Ambulatory Visit (HOSPITAL_BASED_OUTPATIENT_CLINIC_OR_DEPARTMENT_OTHER): Payer: Medicare PPO

## 2020-08-18 ENCOUNTER — Ambulatory Visit (HOSPITAL_COMMUNITY)
Admission: RE | Admit: 2020-08-18 | Discharge: 2020-08-18 | Disposition: A | Discharge status: Home / Self Care | Payer: Medicare PPO | Attending: Cardiovascular Disease | Admitting: Cardiovascular Disease

## 2020-08-18 ENCOUNTER — Ambulatory Visit (HOSPITAL_COMMUNITY): Payer: Medicare PPO | Admitting: Anesthesiology

## 2020-08-18 ENCOUNTER — Encounter (HOSPITAL_COMMUNITY)
Admission: RE | Disposition: A | Discharge status: Home / Self Care | Payer: Medicare PPO | Source: Home / Self Care | Attending: Cardiovascular Disease

## 2020-08-18 ENCOUNTER — Encounter (HOSPITAL_COMMUNITY): Payer: Self-pay | Admitting: Cardiovascular Disease

## 2020-08-18 DIAGNOSIS — Z79899 Other long term (current) drug therapy: Secondary | ICD-10-CM | POA: Insufficient documentation

## 2020-08-18 DIAGNOSIS — I081 Rheumatic disorders of both mitral and tricuspid valves: Secondary | ICD-10-CM | POA: Diagnosis not present

## 2020-08-18 DIAGNOSIS — I083 Combined rheumatic disorders of mitral, aortic and tricuspid valves: Secondary | ICD-10-CM | POA: Diagnosis not present

## 2020-08-18 DIAGNOSIS — N183 Chronic kidney disease, stage 3 unspecified: Secondary | ICD-10-CM | POA: Diagnosis not present

## 2020-08-18 DIAGNOSIS — Z7901 Long term (current) use of anticoagulants: Secondary | ICD-10-CM | POA: Diagnosis not present

## 2020-08-18 DIAGNOSIS — I4819 Other persistent atrial fibrillation: Secondary | ICD-10-CM | POA: Diagnosis not present

## 2020-08-18 DIAGNOSIS — R600 Localized edema: Secondary | ICD-10-CM | POA: Insufficient documentation

## 2020-08-18 DIAGNOSIS — D631 Anemia in chronic kidney disease: Secondary | ICD-10-CM | POA: Diagnosis not present

## 2020-08-18 DIAGNOSIS — I313 Pericardial effusion (noninflammatory): Secondary | ICD-10-CM | POA: Insufficient documentation

## 2020-08-18 DIAGNOSIS — I10 Essential (primary) hypertension: Secondary | ICD-10-CM | POA: Diagnosis not present

## 2020-08-18 DIAGNOSIS — I130 Hypertensive heart and chronic kidney disease with heart failure and stage 1 through stage 4 chronic kidney disease, or unspecified chronic kidney disease: Secondary | ICD-10-CM | POA: Diagnosis not present

## 2020-08-18 DIAGNOSIS — Z794 Long term (current) use of insulin: Secondary | ICD-10-CM | POA: Insufficient documentation

## 2020-08-18 DIAGNOSIS — I480 Paroxysmal atrial fibrillation: Secondary | ICD-10-CM | POA: Diagnosis not present

## 2020-08-18 DIAGNOSIS — Q211 Atrial septal defect: Secondary | ICD-10-CM | POA: Diagnosis not present

## 2020-08-18 DIAGNOSIS — E1122 Type 2 diabetes mellitus with diabetic chronic kidney disease: Secondary | ICD-10-CM | POA: Diagnosis not present

## 2020-08-18 DIAGNOSIS — I340 Nonrheumatic mitral (valve) insufficiency: Secondary | ICD-10-CM

## 2020-08-18 DIAGNOSIS — D62 Acute posthemorrhagic anemia: Secondary | ICD-10-CM | POA: Diagnosis not present

## 2020-08-18 DIAGNOSIS — J9621 Acute and chronic respiratory failure with hypoxia: Secondary | ICD-10-CM | POA: Diagnosis not present

## 2020-08-18 DIAGNOSIS — I0981 Rheumatic heart failure: Secondary | ICD-10-CM | POA: Diagnosis not present

## 2020-08-18 DIAGNOSIS — I5031 Acute diastolic (congestive) heart failure: Secondary | ICD-10-CM | POA: Diagnosis not present

## 2020-08-18 DIAGNOSIS — J9601 Acute respiratory failure with hypoxia: Secondary | ICD-10-CM | POA: Diagnosis not present

## 2020-08-18 DIAGNOSIS — E7800 Pure hypercholesterolemia, unspecified: Secondary | ICD-10-CM | POA: Diagnosis not present

## 2020-08-18 HISTORY — PX: TEE WITHOUT CARDIOVERSION: SHX5443

## 2020-08-18 SURGERY — ECHOCARDIOGRAM, TRANSESOPHAGEAL
Anesthesia: Monitor Anesthesia Care

## 2020-08-18 MED ORDER — SODIUM CHLORIDE 0.9 % IV SOLN: INTRAVENOUS | Status: DC | PRN

## 2020-08-18 MED ORDER — PROPOFOL 500 MG/50ML IV EMUL
INTRAVENOUS | Status: DC | PRN
  Administered 2020-08-18: 150 ug/kg/min via INTRAVENOUS

## 2020-08-18 MED ORDER — PHENYLEPHRINE 40 MCG/ML (10ML) SYRINGE FOR IV PUSH (FOR BLOOD PRESSURE SUPPORT)
PREFILLED_SYRINGE | INTRAVENOUS | Status: DC | PRN
  Administered 2020-08-18 (×2): 120 ug via INTRAVENOUS

## 2020-08-18 MED ORDER — PROPOFOL 10 MG/ML IV BOLUS
INTRAVENOUS | Status: DC | PRN
  Administered 2020-08-18: 30 mg via INTRAVENOUS

## 2020-08-18 MED ORDER — LIDOCAINE 2% (20 MG/ML) 5 ML SYRINGE
INTRAMUSCULAR | Status: DC | PRN
  Administered 2020-08-18: 60 mg via INTRAVENOUS

## 2020-08-18 NOTE — Interval H&P Note (Signed)
History and Physical Interval Note:  08/18/2020 8:41 AM  [name]  has presented today for surgery, with the diagnosis of AFIB.  The various methods of treatment have been discussed with the patient and family. After consideration of risks, benefits and other options for treatment, the patient has consented to  Procedure(s): TRANSESOPHAGEAL ECHOCARDIOGRAM (TEE) (N/A) as a surgical intervention.  The patient's history has been reviewed, patient examined, no change in status, stable for surgery.  I have reviewed the patient's chart and labs.  Questions were answered to the patient's satisfaction.     Jenkins Rouge

## 2020-08-18 NOTE — Progress Notes (Signed)
  Echocardiogram Echocardiogram Transesophageal has been performed.  [name] 08/18/2020, 10:53 AM

## 2020-08-18 NOTE — Anesthesia Preprocedure Evaluation (Signed)
Anesthesia Evaluation  Patient identified by MRN, date of birth, ID band Patient awake    Reviewed: Allergy & Precautions, NPO status , Patient's Chart, lab work & pertinent test results, reviewed documented beta blocker date and time   History of Anesthesia Complications (+) PONV and history of anesthetic complications  Airway Mallampati: II  TM Distance: >3 FB Neck ROM: Full    Dental no notable dental hx. (+) Teeth Intact   Pulmonary asthma ,    Pulmonary exam normal breath sounds clear to auscultation       Cardiovascular hypertension, Pt. on medications and Pt. on home beta blockers + Peripheral Vascular Disease  + dysrhythmias Atrial Fibrillation + Valvular Problems/Murmurs  Rhythm:Irregular Rate:Normal  EKG 08/05/20 Atrial fibrillation/flutter  Echo 11.16.21  1. Left ventricular ejection fraction, by estimation, is 55 to 60%. The left ventricle has normal function. The left ventricle has no regional wall motion abnormalities. There is mild asymmetric left ventricular hypertrophy of the posterior-lateral segment. Left ventricular diastolic parameters are indeterminate. Elevated left ventricular end-diastolic pressure.  2. Right ventricular systolic function is mildly reduced. The right  ventricular size is mildly enlarged. There is normal pulmonary artery systolic pressure. The estimated right ventricular systolic pressure is 53.6 mmHg.  3. Left atrial size was moderately dilated.  4. Right atrial size was severely dilated.  5. The mitral valve is abnormal. Trivial mitral valve regurgitation. Moderate mitral annular calcification.  6. Tricuspid valve regurgitation is mild to moderate.  7. The aortic valve is grossly normal. Aortic valve regurgitation is not visualized. No aortic stenosis is present.  8. The inferior vena cava is normal in size with greater than 50% respiratory variability, suggesting right atrial  pressure of 3 mmHg.  9. S/p 27 mm Watchman FLX 07/01/20, which is not well visualized or assessed on this transthoracic echo.   S/P Watchman procedure 11/21   Neuro/Psych PSYCHIATRIC DISORDERS Anxiety Depression Diabetic peripheral neuropathy  Neuromuscular disease    GI/Hepatic Neg liver ROS, hiatal hernia, GERD  Medicated and Controlled,  Endo/Other  diabetes, Poorly Controlled, Type 2, Oral Hypoglycemic AgentsHyperlipidemia  Renal/GU Renal InsufficiencyRenal disease  negative genitourinary   Musculoskeletal  (+) Arthritis , Osteoarthritis,  Chronic back pain S/P spinal cord stimulator S/P Thoracic kyphoplasty   Abdominal   Peds  Hematology  (+) anemia , Eliquis therapy-last dose this am   Anesthesia Other Findings   Reproductive/Obstetrics                             Anesthesia Physical Anesthesia Plan  ASA: III  Anesthesia Plan: MAC   Post-op Pain Management:    Induction: Intravenous  PONV Risk Score and Plan: 3 and Treatment may vary due to age or medical condition  Airway Management Planned: Nasal Cannula and Natural Airway  Additional Equipment:   Intra-op Plan:   Post-operative Plan:   Informed Consent: I have reviewed the patients History and Physical, chart, labs and discussed the procedure including the risks, benefits and alternatives for the proposed anesthesia with the patient or authorized representative who has indicated his/her understanding and acceptance.     Dental advisory given  Plan Discussed with: CRNA and Anesthesiologist  Anesthesia Plan Comments:         Anesthesia Quick Evaluation

## 2020-08-18 NOTE — Transfer of Care (Signed)
Immediate Anesthesia Transfer of Care Note  Patient: AZALEA CEDAR  Procedure(s) Performed: TRANSESOPHAGEAL ECHOCARDIOGRAM (TEE) (N/A )  Patient Location: Endoscopy Unit  Anesthesia Type:MAC  Level of Consciousness: awake and alert   Airway & Oxygen Therapy: Patient Spontanous Breathing  Post-op Assessment: Report given to RN and Post -op Vital signs reviewed and stable  Post vital signs: Reviewed and stable  Last Vitals:  Vitals Value Taken Time  BP    Temp    Pulse    Resp    SpO2      Last Pain:  Vitals:   08/18/20 0854  TempSrc: Oral  PainSc: 0-No pain         Complications: No complications documented.

## 2020-08-18 NOTE — Discharge Instructions (Signed)
Transesophageal Echocardiogram Transesophageal echocardiogram (TEE) is a test that uses sound waves to take pictures of your heart. TEE is done by passing a flexible tube down the esophagus. The esophagus is the tube that carries food from the throat to the stomach. The pictures give detailed images of your heart. This can help your doctor see if there are problems with your heart. What happens before the procedure? Staying hydrated Follow instructions from your doctor about hydration, which may include:  Up to 3 hours before the procedure - you may continue to drink clear liquids, such as: ? Water. ? Clear fruit juice. ? Black coffee. ? Plain tea.  Eating and drinking Follow instructions from your doctor about eating and drinking, which may include:  8 hours before the procedure - stop eating heavy meals or foods such as meat, fried foods, or fatty foods.  6 hours before the procedure - stop eating light meals or foods, such as toast or cereal.  6 hours before the procedure - stop drinking milk or drinks that contain milk.  3 hours before the procedure - stop drinking clear liquids. General instructions  You will need to take out any dentures or retainers.  Plan to have someone take you home from the hospital or clinic.  If you will be going home right after the procedure, plan to have someone with you for 24 hours.  Ask your doctor about: ? Changing or stopping your normal medicines. This is important if you take diabetes medicines or blood thinners. ? Taking over-the-counter medicines, vitamins, herbs, and supplements. ? Taking medicines such as aspirin and ibuprofen. These medicines can thin your blood. Do not take these medicines unless your doctor tells you to take them. What happens during the procedure?  To lower your risk of infection, your doctors will wash or clean their hands.  An IV will be put into one of your veins.  You will be given a medicine to help you  relax (sedative).  A medicine may be sprayed or gargled. This numbs the back of your throat.  Your blood pressure, heart rate, and breathing will be watched.  You may be asked to lay on your left side.  A bite block will be placed in your mouth. This keeps you from biting the tube.  The tip of the TEE probe will be placed into the back of your mouth.  You will be asked to swallow.  Your doctor will take pictures of your heart.  The probe and bite block will be taken out. The procedure may vary among doctors and hospitals. What happens after the procedure?   Your blood pressure, heart rate, breathing rate, and blood oxygen level will be watched until the medicines you were given have worn off.  When you first wake up, your throat may feel sore and numb. This will get better over time. You will not be allowed to eat or drink until the numbness has gone away.  Do not drive for 24 hours if you were given a medicine to help you relax. Summary  TEE is a test that uses sound waves to take pictures of your heart.  You will be given a medicine to help you relax.  Do not drive for 24 hours if you were given a medicine to help you relax. This information is not intended to replace advice given to you by your health care provider. Make sure you discuss any questions you have with your health care provider. Document Revised:   05/03/2018 Document Reviewed: 11/15/2016 Elsevier Patient Education  2020 [employer]vier Inc.  

## 2020-08-18 NOTE — Anesthesia Procedure Notes (Signed)
Procedure Name: MAC Date/Time: 08/18/2020 9:58 AM Performed by: Imagene Riches, CRNA Pre-anesthesia Checklist: Patient identified, Emergency Drugs available, Suction available, Patient being monitored and Timeout performed Patient Re-evaluated:Patient Re-evaluated prior to induction Oxygen Delivery Method: Nasal cannula

## 2020-08-18 NOTE — CV Procedure (Signed)
TEE: Sedation: Propofol  See full report in [facility] Watchman 27 mm FLX in great position No large sholders Sealed appendage in all views No thrombus Good compression  TEE otherwise notable for persistent trivial pericardial effusion Trivial MR AV sclerosis The ASD caused by the trans septal puncture is now trivial and almost sealed   Jenkins Rouge MD [facility]

## 2020-08-18 NOTE — Anesthesia Postprocedure Evaluation (Signed)
Anesthesia Post Note  Patient: [name]  Procedure(s) Performed: TRANSESOPHAGEAL ECHOCARDIOGRAM (TEE) (N/A )     Patient location during evaluation: Endoscopy Anesthesia Type: MAC Level of consciousness: awake and alert Pain management: pain level controlled Vital Signs Assessment: post-procedure vital signs reviewed and stable Respiratory status: spontaneous breathing, nonlabored ventilation, respiratory function stable and patient connected to nasal cannula oxygen Cardiovascular status: blood pressure returned to baseline and stable Postop Assessment: no apparent nausea or vomiting Anesthetic complications: no   No complications documented.  Last Vitals:  Vitals:   08/18/20 1035 08/18/20 1045  BP: (!) 130/56 (!) 146/59  Pulse: 90 99  Resp: (!) 24 (!) 24  Temp:    SpO2: 98% 95%    Last Pain:  Vitals:   08/18/20 1045  TempSrc:   PainSc: 0-No pain                 Mitchelle Goerner L Clinten Howk

## 2020-08-19 DIAGNOSIS — J9601 Acute respiratory failure with hypoxia: Secondary | ICD-10-CM | POA: Diagnosis not present

## 2020-08-19 DIAGNOSIS — I0981 Rheumatic heart failure: Secondary | ICD-10-CM | POA: Diagnosis not present

## 2020-08-19 DIAGNOSIS — I083 Combined rheumatic disorders of mitral, aortic and tricuspid valves: Secondary | ICD-10-CM | POA: Diagnosis not present

## 2020-08-19 DIAGNOSIS — D62 Acute posthemorrhagic anemia: Secondary | ICD-10-CM | POA: Diagnosis not present

## 2020-08-19 DIAGNOSIS — D631 Anemia in chronic kidney disease: Secondary | ICD-10-CM | POA: Diagnosis not present

## 2020-08-19 DIAGNOSIS — I5031 Acute diastolic (congestive) heart failure: Secondary | ICD-10-CM | POA: Diagnosis not present

## 2020-08-19 DIAGNOSIS — N183 Chronic kidney disease, stage 3 unspecified: Secondary | ICD-10-CM | POA: Diagnosis not present

## 2020-08-19 DIAGNOSIS — E1122 Type 2 diabetes mellitus with diabetic chronic kidney disease: Secondary | ICD-10-CM | POA: Diagnosis not present

## 2020-08-19 DIAGNOSIS — I130 Hypertensive heart and chronic kidney disease with heart failure and stage 1 through stage 4 chronic kidney disease, or unspecified chronic kidney disease: Secondary | ICD-10-CM | POA: Diagnosis not present

## 2020-08-20 ENCOUNTER — Telehealth: Payer: Self-pay | Admitting: Cardiology

## 2020-08-20 DIAGNOSIS — I480 Paroxysmal atrial fibrillation: Secondary | ICD-10-CM

## 2020-08-20 MED ORDER — CLOPIDOGREL BISULFATE 75 MG PO TABS: ORAL_TABLET | ORAL | 2 refills | Status: DC

## 2020-08-20 NOTE — Telephone Encounter (Signed)
Reviewed TEE showing good Watchman device placement without leak or thrombus. I called and spoke with [name]. She is doing well. Plan to start Plavix and stop the Eliquis.   She will: 1. Stop Eliquis 2. The day after her last dose of eliquis she will START Plavix. She will take 300mg  of plavix the first day and then 75mg  daily thereafter.  Nancy Galas T. Quentin Ore, MD, [facility] Cardiac Electrophysiology

## 2020-08-23 ENCOUNTER — Other Ambulatory Visit: Payer: Self-pay

## 2020-08-23 MED ORDER — POTASSIUM CHLORIDE CRYS ER 20 MEQ PO TBCR
40.0000 meq | EXTENDED_RELEASE_TABLET | Freq: Every day | ORAL | 0 refills | Status: DC
Start: 2020-08-23 — End: 2020-08-31

## 2020-08-23 MED ORDER — MAGNESIUM OXIDE 400 (241.3 MG) MG PO TABS
200.0000 mg | ORAL_TABLET | Freq: Two times a day (BID) | ORAL | 0 refills | Status: DC
Start: 2020-08-23 — End: 2020-08-31

## 2020-08-24 ENCOUNTER — Telehealth: Payer: Self-pay | Admitting: Nurse Practitioner

## 2020-08-24 DIAGNOSIS — N183 Chronic kidney disease, stage 3 unspecified: Secondary | ICD-10-CM | POA: Diagnosis not present

## 2020-08-24 DIAGNOSIS — I083 Combined rheumatic disorders of mitral, aortic and tricuspid valves: Secondary | ICD-10-CM | POA: Diagnosis not present

## 2020-08-24 DIAGNOSIS — E1122 Type 2 diabetes mellitus with diabetic chronic kidney disease: Secondary | ICD-10-CM | POA: Diagnosis not present

## 2020-08-24 DIAGNOSIS — D62 Acute posthemorrhagic anemia: Secondary | ICD-10-CM | POA: Diagnosis not present

## 2020-08-24 DIAGNOSIS — D631 Anemia in chronic kidney disease: Secondary | ICD-10-CM | POA: Diagnosis not present

## 2020-08-24 DIAGNOSIS — I130 Hypertensive heart and chronic kidney disease with heart failure and stage 1 through stage 4 chronic kidney disease, or unspecified chronic kidney disease: Secondary | ICD-10-CM | POA: Diagnosis not present

## 2020-08-24 DIAGNOSIS — I0981 Rheumatic heart failure: Secondary | ICD-10-CM | POA: Diagnosis not present

## 2020-08-24 DIAGNOSIS — J9601 Acute respiratory failure with hypoxia: Secondary | ICD-10-CM | POA: Diagnosis not present

## 2020-08-24 DIAGNOSIS — I5031 Acute diastolic (congestive) heart failure: Secondary | ICD-10-CM | POA: Diagnosis not present

## 2020-08-24 NOTE — Telephone Encounter (Signed)
Call placed this morning to [name] to check on her new medication regimen. She reports that she has stopped the Eliquis and is now taking the Plavix once daily. She stated that she took 4 75 mg tablets (300mg ) the first day and is now taking (1) 75 mg tablet daily.We also discussed the importance of not skipping a dose and to call is she notices excessive bruising or bleeding that wont stop. She expressed a full understanding and thanked me for the call.   , RN Watchman Coordinator

## 2020-08-25 DIAGNOSIS — I5031 Acute diastolic (congestive) heart failure: Secondary | ICD-10-CM | POA: Diagnosis not present

## 2020-08-25 DIAGNOSIS — E1122 Type 2 diabetes mellitus with diabetic chronic kidney disease: Secondary | ICD-10-CM | POA: Diagnosis not present

## 2020-08-25 DIAGNOSIS — I130 Hypertensive heart and chronic kidney disease with heart failure and stage 1 through stage 4 chronic kidney disease, or unspecified chronic kidney disease: Secondary | ICD-10-CM | POA: Diagnosis not present

## 2020-08-25 DIAGNOSIS — N183 Chronic kidney disease, stage 3 unspecified: Secondary | ICD-10-CM | POA: Diagnosis not present

## 2020-08-25 DIAGNOSIS — D631 Anemia in chronic kidney disease: Secondary | ICD-10-CM | POA: Diagnosis not present

## 2020-08-25 DIAGNOSIS — I083 Combined rheumatic disorders of mitral, aortic and tricuspid valves: Secondary | ICD-10-CM | POA: Diagnosis not present

## 2020-08-25 DIAGNOSIS — D62 Acute posthemorrhagic anemia: Secondary | ICD-10-CM | POA: Diagnosis not present

## 2020-08-25 DIAGNOSIS — I0981 Rheumatic heart failure: Secondary | ICD-10-CM | POA: Diagnosis not present

## 2020-08-25 DIAGNOSIS — J9601 Acute respiratory failure with hypoxia: Secondary | ICD-10-CM | POA: Diagnosis not present

## 2020-08-31 ENCOUNTER — Encounter: Payer: Self-pay | Admitting: Cardiology

## 2020-08-31 ENCOUNTER — Ambulatory Visit (INDEPENDENT_AMBULATORY_CARE_PROVIDER_SITE_OTHER): Payer: Medicare PPO | Admitting: Cardiology

## 2020-08-31 ENCOUNTER — Other Ambulatory Visit: Payer: Self-pay

## 2020-08-31 VITALS — BP 114/60 | HR 89 | Ht 64.0 in | Wt 149.0 lb

## 2020-08-31 DIAGNOSIS — Z8719 Personal history of other diseases of the digestive system: Secondary | ICD-10-CM

## 2020-08-31 DIAGNOSIS — I4819 Other persistent atrial fibrillation: Secondary | ICD-10-CM | POA: Diagnosis not present

## 2020-08-31 MED ORDER — MAGNESIUM OXIDE 400 (241.3 MG) MG PO TABS
200.0000 mg | ORAL_TABLET | Freq: Two times a day (BID) | ORAL | 3 refills | Status: DC
Start: 2020-08-31 — End: 2021-09-12

## 2020-08-31 MED ORDER — POTASSIUM CHLORIDE CRYS ER 20 MEQ PO TBCR
40.0000 meq | EXTENDED_RELEASE_TABLET | Freq: Every day | ORAL | 3 refills | Status: DC
Start: 2020-08-31 — End: 2021-06-21

## 2020-08-31 MED ORDER — FUROSEMIDE 40 MG PO TABS
40.0000 mg | ORAL_TABLET | Freq: Every day | ORAL | 3 refills | Status: DC
Start: 2020-08-31 — End: 2021-09-12

## 2020-08-31 NOTE — H&P (View-Only) (Signed)
Electrophysiology Office Follow up Visit Note:    Date:  08/31/2020   ID:  [name], DOB 07-28-1941, MRN [id number]  PCP:  Juluis Rainier, MD  [facility] Cardiologist:  Donato Schultz, MD  [facility] Electrophysiologist:  Lanier Prude, MD    Interval History:    [name] is a 80 y.o. female who presents for a follow up visit after watchman implant July 01, 2020.  She is doing well since implant.  Her follow-up transesophageal echocardiogram shows that the watchman is well-seated without device related thrombus.  She has since been transitioned to Plavix and is doing well taking this medication.  No recurrence of GI bleeding.  She remains in atrial fibrillation with controlled ventricular rates at 89 bpm today.  She is interested in attempting to restore normal rhythm.     Past Medical History:  Diagnosis Date  . Acute GI bleeding 05/13/2015  . Anxiety   . Arthritis   . Asthma   . Atrial fibrillation (HCC) 10/29/2017  . Bakers cyst, left   . Chronic kidney disease    stage 3  . Constipation   . Diabetes mellitus    type 2  . Dysrhythmia   . GERD (gastroesophageal reflux disease)   . Heart murmur   . History of cellulitis   . History of dizziness   . History of hiatal hernia    tiny 06/15/2016  . Hypertension   . Iron deficiency anemia   . Peripheral vascular disease (HCC)   . PONV (postoperative nausea and vomiting)   . Pulmonary nodule     Past Surgical History:  Procedure Laterality Date  . APPENDECTOMY  1952  . BIOPSY  10/16/2018   Procedure: BIOPSY;  Surgeon: Vida Rigger, MD;  Location: [facility];  Service: Endoscopy;;  . BIOPSY  11/26/2019   Procedure: BIOPSY;  Surgeon: Vida Rigger, MD;  Location: [facility];  Service: Endoscopy;;  . BIOPSY  07/14/2020   Procedure: BIOPSY;  Surgeon: Bernette Redbird, MD;  Location: [facility];  Service: Endoscopy;;  . COLONOSCOPY W/ POLYPECTOMY  05/15/2013  . ESOPHAGOGASTRODUODENOSCOPY (EGD)  WITH PROPOFOL N/A 06/15/2016   Procedure: ESOPHAGOGASTRODUODENOSCOPY (EGD) WITH PROPOFOL;  Surgeon: Vida Rigger, MD;  [facility]: [facility];  Service: Endoscopy;  Laterality: N/A;  . ESOPHAGOGASTRODUODENOSCOPY (EGD) WITH PROPOFOL N/A 10/16/2018   Procedure: ESOPHAGOGASTRODUODENOSCOPY (EGD) WITH PROPOFOL;  Surgeon: Vida Rigger, MD;  [facility];  Service: Endoscopy;  Laterality: N/A;  . ESOPHAGOGASTRODUODENOSCOPY (EGD) WITH PROPOFOL N/A 11/26/2019   Procedure: ESOPHAGOGASTRODUODENOSCOPY (EGD) WITH PROPOFOL;  Surgeon: Vida Rigger, MD;  [facility]: [facility];  Service: Endoscopy;  Laterality: N/A;  . ESOPHAGOGASTRODUODENOSCOPY (EGD) WITH PROPOFOL N/A 07/14/2020   Procedure: ESOPHAGOGASTRODUODENOSCOPY (EGD) WITH PROPOFOL;  Surgeon: Bernette Redbird, MD;  [facility]: [facility];  Service: Endoscopy;  Laterality: N/A;  . EYE SURGERY Bilateral    cataracts removed  . GI RADIOFREQUENCY ABLATION  10/16/2018   Procedure: GI RADIOFREQUENCY ABLATION;  Surgeon: Vida Rigger, MD;  [facility]: [facility];  Service: Endoscopy;;  . GI RADIOFREQUENCY ABLATION N/A 11/26/2019   Procedure: GI RADIOFREQUENCY ABLATION;  Surgeon: Vida Rigger, MD;  Location: [facility];  Service: Endoscopy;  Laterality: N/A;  . HAND SURGERY Left 2010   operated on index and ring finger  . HOT HEMOSTASIS N/A 07/14/2020   Procedure: HOT HEMOSTASIS (ARGON PLASMA COAGULATION/BICAP);  Surgeon: Bernette Redbird, MD;  Location: [facility];  Service: Endoscopy;  Laterality: N/A;  . INCONTINENCE SURGERY    . JOINT REPLACEMENT Right  knee  . KNEE ARTHROSCOPY  2003 2005   right knee  . KYPHOPLASTY N/A 01/29/2020   Procedure: T8 KYPHOPLASTY;  Surgeon: Melina Schools, MD;  Location: [place];  Service: Orthopedics;  Laterality: N/A;  60 mins Local with [facility]  . LEFT ATRIAL APPENDAGE OCCLUSION N/A 07/01/2020   Procedure: LEFT ATRIAL APPENDAGE OCCLUSION;  Surgeon: Vickie Epley, MD;  Location: [facility];  Service:  Cardiovascular;  Laterality: N/A;  . mass fallopian tube    . SPINAL CORD STIMULATOR BATTERY EXCHANGE N/A 09/09/2015   Procedure: SPINAL CORD STIMULATOR BATTERY EXCHANGE;  Surgeon: Melina Schools, MD;  Location: [place];  Service: Orthopedics;  Laterality: N/A;  . TEE WITHOUT CARDIOVERSION N/A 08/18/2020   Procedure: TRANSESOPHAGEAL ECHOCARDIOGRAM (TEE);  Surgeon: Josue Hector, MD;  Location: [facility] ENDOSCOPY;  Service: Cardiovascular;  Laterality: N/A;  . TOTAL KNEE ARTHROPLASTY Left 11/05/2017   Procedure: LEFT TOTAL KNEE ARTHROPLASTY;  Surgeon: Paralee Cancel, MD;  Location: [facility];  Service: Orthopedics;  Laterality: Left;  Adductor Block  . VAGINAL HYSTERECTOMY  1977   1 ovary removed    Current Medications: Current Meds  Medication Sig  . acetaminophen (TYLENOL) 500 MG tablet Take 1,000 mg by mouth every 8 (eight) hours as needed for mild pain, moderate pain or headache.   . albuterol (PROAIR HFA) 108 (90 BASE) MCG/ACT inhaler Inhale 2 puffs into the lungs every 4 (four) hours as needed for wheezing or shortness of breath.  . ALPRAZolam (XANAX) 0.25 MG tablet Take 0.25 mg by mouth at bedtime.   . clopidogrel (PLAVIX) 75 MG tablet Take 4 tablets (300 mg total) by mouth daily for 1 day, THEN 1 tablet (75 mg total) daily.  . Continuous Blood Gluc Sensor (FREESTYLE LIBRE 14 DAY SENSOR) MISC Inject 1 patch into the skin every 14 (fourteen) days.  . diphenhydrAMINE (BENADRYL) 25 MG tablet Take 25 mg by mouth daily as needed for allergies.  Marland Kitchen docusate sodium (COLACE) 100 MG capsule Take 100 mg by mouth 2 (two) times daily as needed for mild constipation.   . DROPLET PEN NEEDLES 32G X 4 MM MISC   . esomeprazole (NEXIUM) 40 MG capsule Take 40 mg by mouth in the morning and at bedtime.   . fluticasone (FLONASE) 50 MCG/ACT nasal spray Place 1 spray into both nostrils daily as needed for allergies.   . furosemide (LASIX) 40 MG tablet Take 1 tablet (40 mg total) by mouth daily.  Marland Kitchen gabapentin (NEURONTIN)  300 MG capsule Take 600 mg by mouth in the morning, at noon, and at bedtime.   . insulin glargine (LANTUS) 100 UNIT/ML Solostar Pen Inject 30 Units into the skin at bedtime.   . Iron-FA-B Cmp-C-Biot-Probiotic (FUSION PLUS) CAPS Take 1 capsule by mouth every evening.   . magnesium oxide (MAG-OX) 400 (241.3 Mg) MG tablet Take 0.5 tablets (200 mg total) by mouth 2 (two) times daily.  . metoprolol tartrate (LOPRESSOR) 100 MG tablet Take 1 tablet (100 mg total) by mouth 2 (two) times daily. (Patient taking differently: Take 200 mg by mouth 2 (two) times daily.)  . Multiple Vitamins-Minerals (PRESERVISION AREDS 2 PO) Take 1 capsule by mouth 2 (two) times daily.   Marland Kitchen NOVOLOG FLEXPEN 100 UNIT/ML FlexPen Inject 0-16 Units into the skin See admin instructions. Inject 0-16 units into the skin, PER SLIDING SCALE, in the morning, noon, and bedtime  . OXYGEN Inhale 2.5 L/min into the lungs as needed (for shortness of breath).  . Pitavastatin Calcium (LIVALO)  1 MG TABS Take 1 mg by mouth at bedtime.  . potassium chloride SA (KLOR-CON) 20 MEQ tablet Take 2 tablets (40 mEq total) by mouth daily.  . sodium chloride (OCEAN) 0.65 % SOLN nasal spray Place 1 spray into both nostrils as needed for congestion.  Marland Kitchen venlafaxine (EFFEXOR) 37.5 MG tablet Take 37.5 mg by mouth 2 (two) times daily.     Allergies:   Vasotec, Atorvastatin, Codeine, Cymbalta [duloxetine hcl], Lansoprazole, Morphine and related, Sulfate, Allevyn adhesive [wound dressings], Amitriptyline hcl, Escitalopram oxalate, Iron, Sertraline hcl, Tramadol, Adhesive [tape], and Scopolamine   Social History   Socioeconomic History  . Marital status: Married    Spouse name: Charlotte Crumb  . Number of children: 2  . Years of education: 26  . Highest education level: Not on file  Occupational History    Comment: retired Pharmacist, hospital, IT sales professional  Tobacco Use  . Smoking status: Never Smoker  . Smokeless tobacco: Never Used  Vaping Use  . Vaping Use: Never used   Substance and Sexual Activity  . Alcohol use: No  . Drug use: No  . Sexual activity: Never    Birth control/protection: Surgical  Other Topics Concern  . Not on file  Social History Narrative   Lives with husband   Caffeine use- none   Social Determinants of Health   Financial Resource Strain: Not on file  Food Insecurity: Not on file  Transportation Needs: Not on file  Physical Activity: Not on file  Stress: Not on file  Social Connections: Not on file     Family History: The patient's family history includes Heart attack in her father; Lung cancer in her mother. There is no history of Colon cancer, Stroke, or Migraines.  ROS:   Please see the history of present illness.    All other systems reviewed and are negative.  EKGs/Labs/Other Studies Reviewed:    The following studies were reviewed today: Transesophageal echo  August 18, 2020 transesophageal echo personally reviewed Watchman is well-seated without device related thrombus. No large shoulders.  Average compression 28%.   EKG:  The ekg ordered today demonstrates atrial flutter with a ventricular rate of 89 bpm.  Recent Labs: 07/22/2020: ALT 21; B Natriuretic Peptide 347.4; TSH 0.549 07/27/2020: BUN 26; Creatinine, Ser 0.92; Hemoglobin 10.0; Magnesium 1.8; Platelets 366; Potassium 4.6; Sodium 137  Recent Lipid Panel No results found for: CHOL, TRIG, HDL, CHOLHDL, VLDL, LDLCALC, LDLDIRECT  Physical Exam:    VS:  BP 114/60   Pulse 89   Ht 5\' 4"  (1.626 m)   Wt 149 lb (67.6 kg)   SpO2 95%   BMI 25.58 kg/m     Wt Readings from Last 3 Encounters:  08/31/20 149 lb (67.6 kg)  08/18/20 149 lb (67.6 kg)  08/13/20 149 lb (67.6 kg)     GEN:  Well nourished, well developed in no acute distress HEENT: Normal NECK: No JVD; No carotid bruits LYMPHATICS: No lymphadenopathy CARDIAC: Irregularly irregular, no murmurs, rubs, gallops RESPIRATORY:  Clear to auscultation without rales, wheezing or rhonchi   ABDOMEN: Soft, non-tender, non-distended MUSCULOSKELETAL:  No edema; No deformity  SKIN: Warm and dry NEUROLOGIC:  Alert and oriented x 3 PSYCHIATRIC:  Normal affect   ASSESSMENT:    1. Persistent atrial fibrillation ([place])   2. History of GI bleed    PLAN:    In order of problems listed above:  1. Persistent atrial fibrillation On Plavix for stroke prophylaxis in setting of recent watchman implant. She continues to  be in atrial fibrillation.  Would like to cardiovert her to try to restore normal rhythm.  We will plan on performing a transesophageal echo given the relatively recent watchman implant.  2.  History of GI bleed Now off systemic anticoagulation given 45-day TEE after watchman implant showed well-seated device without thrombus. Continue Plavix.   Medication Adjustments/Labs and Tests Ordered: Current medicines are reviewed at length with the patient today.  Concerns regarding medicines are outlined above.  No orders of the defined types were placed in this encounter.  No orders of the defined types were placed in this encounter.    Signed, Lars Mage, MD, [facility]  08/31/2020 2:29 PM    Electrophysiology [facility]

## 2020-08-31 NOTE — Patient Instructions (Addendum)
Medication Instructions:  Your physician recommends that you continue on your current medications as directed. Please refer to the Current Medication list given to you today.  *If you need a refill on your cardiac medications before your next appointment, please call your pharmacy*   Lab Work:   None ordered.   If you have labs (blood work) drawn today and your tests are completely normal, you will receive your results only by:  MyChart Message (if you have MyChart) OR  A paper copy in the mail If you have any lab test that is abnormal or we need to change your treatment, we will call you to review the results.   Testing/Procedures:  Your physician has requested that you have a TEE/Cardioversion. During a TEE, sound waves are used to create images of your heart. It provides your doctor with information about the size and shape of your heart and how well your hearts chambers and valves are working. In this test, a transducer is attached to the end of a flexible tube that is guided down you throat and into your esophagus (the tube leading from your mouth to your stomach) to get a more detailed image of your heart. Once the TEE has determined that a blood clot is not present, the cardioversion begins. Electrical Cardioversion uses a jolt of electricity to your heart either through paddles or wired patches attached to your chest. This is a controlled, usually prescheduled, procedure. This procedure is done at the hospital and you are not awake during the procedure. You usually go home the day of the procedure. Please see the instruction sheet given to you today for more information.   Follow-Up: At [facility], you and your health needs are our priority.  As part of our continuing mission to provide you with exceptional heart care, we have created designated Provider Care Teams.  These Care Teams include your primary Cardiologist (physician) and Advanced Practice Providers (APPs -  Physician  Assistants and Nurse Practitioners) who all work together to provide you with the care you need, when you need it.  We recommend signing up for the patient portal called "MyChart".  Sign up information is provided on this After Visit Summary.  MyChart is used to connect with patients for Virtual Visits (Telemedicine).  Patients are able to view lab/test results, encounter notes, upcoming appointments, etc.  Non-urgent messages can be sent to your provider as well.   To learn more about what you can do with MyChart, go to [email or web address].    Your next appointment:    Follow up with Dr Lalla Brothers 10/12/2020 at 1045am   Other Instructions  You are scheduled for a TEE/Cardioversion on Thursday 09/09/2020  with Dr. Eden Emms.  Please arrive at the [facility] (Main Entrance A) at [facility]: [address] [place], [place] [address] at 8am  DIET: Nothing to eat or drink after midnight.  Medication Instructions: Take only 1/2 of your normal dose of Lantus the night before your procedure.   Continue your anticoagulant: Plavix  You will need to continue your anticoagulant after your procedure until you  are told by your Provider that it is safe to stop   Labs: you will have your labs completed the morning of your procedure at the hospital.  Your Covid Screening has been scheduled for Tuesday 09/07/2020 at 1015am at [address] [facility] [place].  This is a drive thru testing site.  Please stay in your car and you will be directed  to the appropriate line.  You must have a responsible person to drive you home and stay in the waiting area during your procedure. Failure to do so could result in cancellation.  Bring your insurance cards.  *Special Note: Every effort is made to have your procedure done on time. Occasionally there are emergencies that occur at the hospital that may cause delays. Please be patient if a delay does occur.

## 2020-08-31 NOTE — Progress Notes (Signed)
Electrophysiology Office Follow up Visit Note:    Date:  08/31/2020   ID:  SWEDEN LESURE, DOB 07-28-1941, MRN [id number]  PCP:  Juluis Rainier, MD  [facility] Cardiologist:  Donato Schultz, MD  [facility] Electrophysiologist:  Lanier Prude, MD    Interval History:    [name] is a 80 y.o. female who presents for a follow up visit after watchman implant July 01, 2020.  She is doing well since implant.  Her follow-up transesophageal echocardiogram shows that the watchman is well-seated without device related thrombus.  She has since been transitioned to Plavix and is doing well taking this medication.  No recurrence of GI bleeding.  She remains in atrial fibrillation with controlled ventricular rates at 89 bpm today.  She is interested in attempting to restore normal rhythm.     Past Medical History:  Diagnosis Date  . Acute GI bleeding 05/13/2015  . Anxiety   . Arthritis   . Asthma   . Atrial fibrillation (HCC) 10/29/2017  . Bakers cyst, left   . Chronic kidney disease    stage 3  . Constipation   . Diabetes mellitus    type 2  . Dysrhythmia   . GERD (gastroesophageal reflux disease)   . Heart murmur   . History of cellulitis   . History of dizziness   . History of hiatal hernia    tiny 06/15/2016  . Hypertension   . Iron deficiency anemia   . Peripheral vascular disease (HCC)   . PONV (postoperative nausea and vomiting)   . Pulmonary nodule     Past Surgical History:  Procedure Laterality Date  . APPENDECTOMY  1952  . BIOPSY  10/16/2018   Procedure: BIOPSY;  Surgeon: Vida Rigger, MD;  Location: [facility];  Service: Endoscopy;;  . BIOPSY  11/26/2019   Procedure: BIOPSY;  Surgeon: Vida Rigger, MD;  Location: [facility];  Service: Endoscopy;;  . BIOPSY  07/14/2020   Procedure: BIOPSY;  Surgeon: Bernette Redbird, MD;  Location: [facility];  Service: Endoscopy;;  . COLONOSCOPY W/ POLYPECTOMY  05/15/2013  . ESOPHAGOGASTRODUODENOSCOPY (EGD)  WITH PROPOFOL N/A 06/15/2016   Procedure: ESOPHAGOGASTRODUODENOSCOPY (EGD) WITH PROPOFOL;  Surgeon: Vida Rigger, MD;  Location: [facility];  Service: Endoscopy;  Laterality: N/A;  . ESOPHAGOGASTRODUODENOSCOPY (EGD) WITH PROPOFOL N/A 10/16/2018   Procedure: ESOPHAGOGASTRODUODENOSCOPY (EGD) WITH PROPOFOL;  Surgeon: Vida Rigger, MD;  [facility]: [facility];  Service: Endoscopy;  Laterality: N/A;  . ESOPHAGOGASTRODUODENOSCOPY (EGD) WITH PROPOFOL N/A 11/26/2019   Procedure: ESOPHAGOGASTRODUODENOSCOPY (EGD) WITH PROPOFOL;  Surgeon: Vida Rigger, MD;  Location: [facility];  Service: Endoscopy;  Laterality: N/A;  . ESOPHAGOGASTRODUODENOSCOPY (EGD) WITH PROPOFOL N/A 07/14/2020   Procedure: ESOPHAGOGASTRODUODENOSCOPY (EGD) WITH PROPOFOL;  Surgeon: Bernette Redbird, MD;  Location: [facility];  Service: Endoscopy;  Laterality: N/A;  . EYE SURGERY Bilateral    cataracts removed  . GI RADIOFREQUENCY ABLATION  10/16/2018   Procedure: GI RADIOFREQUENCY ABLATION;  Surgeon: Vida Rigger, MD;  Location: [facility];  Service: Endoscopy;;  . GI RADIOFREQUENCY ABLATION N/A 11/26/2019   Procedure: GI RADIOFREQUENCY ABLATION;  Surgeon: Vida Rigger, MD;  Location: [facility];  Service: Endoscopy;  Laterality: N/A;  . HAND SURGERY Left 2010   operated on index and ring finger  . HOT HEMOSTASIS N/A 07/14/2020   Procedure: HOT HEMOSTASIS (ARGON PLASMA COAGULATION/BICAP);  Surgeon: Bernette Redbird, MD;  Location: [facility];  Service: Endoscopy;  Laterality: N/A;  . INCONTINENCE SURGERY    . JOINT REPLACEMENT Right  knee  . KNEE ARTHROSCOPY  2003 2005   right knee  . KYPHOPLASTY N/A 01/29/2020   Procedure: T8 KYPHOPLASTY;  Surgeon: Melina Schools, MD;  Location: [place];  Service: Orthopedics;  Laterality: N/A;  60 mins Local with [facility]  . LEFT ATRIAL APPENDAGE OCCLUSION N/A 07/01/2020   Procedure: LEFT ATRIAL APPENDAGE OCCLUSION;  Surgeon: Vickie Epley, MD;  Location: [place] [facility];  Service:  Cardiovascular;  Laterality: N/A;  . mass fallopian tube    . SPINAL CORD STIMULATOR BATTERY EXCHANGE N/A 09/09/2015   Procedure: SPINAL CORD STIMULATOR BATTERY EXCHANGE;  Surgeon: Melina Schools, MD;  Location: [place];  Service: Orthopedics;  Laterality: N/A;  . TEE WITHOUT CARDIOVERSION N/A 08/18/2020   Procedure: TRANSESOPHAGEAL ECHOCARDIOGRAM (TEE);  Surgeon: Josue Hector, MD;  Location: [facility] END[facility];  Service: Cardiovascular;  Laterality: N/A;  . TOTAL KNEE ARTHROPLASTY Left 11/05/2017   Procedure: LEFT TOTAL KNEE ARTHROPLASTY;  Surgeon: Paralee Cancel, MD;  Location: [facility];  Service: Orthopedics;  Laterality: Left;  Adductor Block  . VAGINAL HYSTERECTOMY  1977   1 ovary removed    Current Medications: Current Meds  Medication Sig  . acetaminophen (TYLENOL) 500 MG tablet Take 1,000 mg by mouth every 8 (eight) hours as needed for mild pain, moderate pain or headache.   . albuterol (PROAIR HFA) 108 (90 BASE) MCG/ACT inhaler Inhale 2 puffs into the lungs every 4 (four) hours as needed for wheezing or shortness of breath.  . ALPRAZolam (XANAX) 0.25 MG tablet Take 0.25 mg by mouth at bedtime.   . clopidogrel (PLAVIX) 75 MG tablet Take 4 tablets (300 mg total) by mouth daily for 1 day, THEN 1 tablet (75 mg total) daily.  . Continuous Blood Gluc Sensor (FREESTYLE LIBRE 14 DAY SENSOR) MISC Inject 1 patch into the skin every 14 (fourteen) days.  . diphenhydrAMINE (BENADRYL) 25 MG tablet Take 25 mg by mouth daily as needed for allergies.  Marland Kitchen docusate sodium (COLACE) 100 MG capsule Take 100 mg by mouth 2 (two) times daily as needed for mild constipation.   . DROPLET PEN NEEDLES 32G X 4 MM MISC   . esomeprazole (NEXIUM) 40 MG capsule Take 40 mg by mouth in the morning and at bedtime.   . fluticasone (FLONASE) 50 MCG/ACT nasal spray Place 1 spray into both nostrils daily as needed for allergies.   . furosemide (LASIX) 40 MG tablet Take 1 tablet (40 mg total) by mouth daily.  Marland Kitchen gabapentin (NEURONTIN)  300 MG capsule Take 600 mg by mouth in the morning, at noon, and at bedtime.   . insulin glargine (LANTUS) 100 UNIT/ML Solostar Pen Inject 30 Units into the skin at bedtime.   . Iron-FA-B Cmp-C-Biot-Probiotic (FUSION PLUS) CAPS Take 1 capsule by mouth every evening.   . magnesium oxide (MAG-OX) 400 (241.3 Mg) MG tablet Take 0.5 tablets (200 mg total) by mouth 2 (two) times daily.  . metoprolol tartrate (LOPRESSOR) 100 MG tablet Take 1 tablet (100 mg total) by mouth 2 (two) times daily. (Patient taking differently: Take 200 mg by mouth 2 (two) times daily.)  . Multiple Vitamins-Minerals (PRESERVISION AREDS 2 PO) Take 1 capsule by mouth 2 (two) times daily.   Marland Kitchen NOVOLOG FLEXPEN 100 UNIT/ML FlexPen Inject 0-16 Units into the skin See admin instructions. Inject 0-16 units into the skin, PER SLIDING SCALE, in the morning, noon, and bedtime  . OXYGEN Inhale 2.5 L/min into the lungs as needed (for shortness of breath).  . Pitavastatin Calcium (LIVALO)  1 MG TABS Take 1 mg by mouth at bedtime.  . potassium chloride SA (KLOR-CON) 20 MEQ tablet Take 2 tablets (40 mEq total) by mouth daily.  . sodium chloride (OCEAN) 0.65 % SOLN nasal spray Place 1 spray into both nostrils as needed for congestion.  Marland Kitchen venlafaxine (EFFEXOR) 37.5 MG tablet Take 37.5 mg by mouth 2 (two) times daily.     Allergies:   Vasotec, Atorvastatin, Codeine, Cymbalta [duloxetine hcl], Lansoprazole, Morphine and related, Sulfate, Allevyn adhesive [wound dressings], Amitriptyline hcl, Escitalopram oxalate, Iron, Sertraline hcl, Tramadol, Adhesive [tape], and Scopolamine   Social History   Socioeconomic History  . Marital status: Married    Spouse name: Charlotte Crumb  . Number of children: 2  . Years of education: 32  . Highest education level: Not on file  Occupational History    Comment: retired Pharmacist, hospital, IT sales professional  Tobacco Use  . Smoking status: Never Smoker  . Smokeless tobacco: Never Used  Vaping Use  . Vaping Use: Never used   Substance and Sexual Activity  . Alcohol use: No  . Drug use: No  . Sexual activity: Never    Birth control/protection: Surgical  Other Topics Concern  . Not on file  Social History Narrative   Lives with husband   Caffeine use- none   Social Determinants of Health   Financial Resource Strain: Not on file  Food Insecurity: Not on file  Transportation Needs: Not on file  Physical Activity: Not on file  Stress: Not on file  Social Connections: Not on file     Family History: The patient's family history includes Heart attack in her father; Lung cancer in her mother. There is no history of Colon cancer, Stroke, or Migraines.  ROS:   Please see the history of present illness.    All other systems reviewed and are negative.  EKGs/Labs/Other Studies Reviewed:    The following studies were reviewed today: Transesophageal echo  August 18, 2020 transesophageal echo personally reviewed Watchman is well-seated without device related thrombus. No large shoulders.  Average compression 28%.   EKG:  The ekg ordered today demonstrates atrial flutter with a ventricular rate of 89 bpm.  Recent Labs: 07/22/2020: ALT 21; B Natriuretic Peptide 347.4; TSH 0.549 07/27/2020: BUN 26; Creatinine, Ser 0.92; Hemoglobin 10.0; Magnesium 1.8; Platelets 366; Potassium 4.6; Sodium 137  Recent Lipid Panel No results found for: CHOL, TRIG, HDL, CHOLHDL, VLDL, LDLCALC, LDLDIRECT  Physical Exam:    VS:  BP 114/60   Pulse 89   Ht 5\' 4"  (1.626 m)   Wt 149 lb (67.6 kg)   SpO2 95%   BMI 25.58 kg/m     Wt Readings from Last 3 Encounters:  08/31/20 149 lb (67.6 kg)  08/18/20 149 lb (67.6 kg)  08/13/20 149 lb (67.6 kg)     GEN:  Well nourished, well developed in no acute distress HEENT: Normal NECK: No JVD; No carotid bruits LYMPHATICS: No lymphadenopathy CARDIAC: Irregularly irregular, no murmurs, rubs, gallops RESPIRATORY:  Clear to auscultation without rales, wheezing or rhonchi   ABDOMEN: Soft, non-tender, non-distended MUSCULOSKELETAL:  No edema; No deformity  SKIN: Warm and dry NEUROLOGIC:  Alert and oriented x 3 PSYCHIATRIC:  Normal affect   ASSESSMENT:    1. Persistent atrial fibrillation ([place])   2. History of GI bleed    PLAN:    In order of problems listed above:  1. Persistent atrial fibrillation On Plavix for stroke prophylaxis in setting of recent watchman implant. She continues to  be in atrial fibrillation.  Would like to cardiovert her to try to restore normal rhythm.  We will plan on performing a transesophageal echo given the relatively recent watchman implant.  2.  History of GI bleed Now off systemic anticoagulation given 45-day TEE after watchman implant showed well-seated device without thrombus. Continue Plavix.   Medication Adjustments/Labs and Tests Ordered: Current medicines are reviewed at length with the patient today.  Concerns regarding medicines are outlined above.  No orders of the defined types were placed in this encounter.  No orders of the defined types were placed in this encounter.    Signed, Lars Mage, MD, [facility]  08/31/2020 2:29 PM    Electrophysiology  [facility]

## 2020-09-02 DIAGNOSIS — N183 Chronic kidney disease, stage 3 unspecified: Secondary | ICD-10-CM | POA: Diagnosis not present

## 2020-09-02 DIAGNOSIS — E1122 Type 2 diabetes mellitus with diabetic chronic kidney disease: Secondary | ICD-10-CM | POA: Diagnosis not present

## 2020-09-02 DIAGNOSIS — I0981 Rheumatic heart failure: Secondary | ICD-10-CM | POA: Diagnosis not present

## 2020-09-02 DIAGNOSIS — D631 Anemia in chronic kidney disease: Secondary | ICD-10-CM | POA: Diagnosis not present

## 2020-09-02 DIAGNOSIS — I130 Hypertensive heart and chronic kidney disease with heart failure and stage 1 through stage 4 chronic kidney disease, or unspecified chronic kidney disease: Secondary | ICD-10-CM | POA: Diagnosis not present

## 2020-09-02 DIAGNOSIS — J9601 Acute respiratory failure with hypoxia: Secondary | ICD-10-CM | POA: Diagnosis not present

## 2020-09-02 DIAGNOSIS — I083 Combined rheumatic disorders of mitral, aortic and tricuspid valves: Secondary | ICD-10-CM | POA: Diagnosis not present

## 2020-09-02 DIAGNOSIS — D62 Acute posthemorrhagic anemia: Secondary | ICD-10-CM | POA: Diagnosis not present

## 2020-09-02 DIAGNOSIS — I5031 Acute diastolic (congestive) heart failure: Secondary | ICD-10-CM | POA: Diagnosis not present

## 2020-09-03 DIAGNOSIS — I130 Hypertensive heart and chronic kidney disease with heart failure and stage 1 through stage 4 chronic kidney disease, or unspecified chronic kidney disease: Secondary | ICD-10-CM | POA: Diagnosis not present

## 2020-09-03 DIAGNOSIS — I0981 Rheumatic heart failure: Secondary | ICD-10-CM | POA: Diagnosis not present

## 2020-09-03 DIAGNOSIS — E1122 Type 2 diabetes mellitus with diabetic chronic kidney disease: Secondary | ICD-10-CM | POA: Diagnosis not present

## 2020-09-03 DIAGNOSIS — N183 Chronic kidney disease, stage 3 unspecified: Secondary | ICD-10-CM | POA: Diagnosis not present

## 2020-09-03 DIAGNOSIS — I083 Combined rheumatic disorders of mitral, aortic and tricuspid valves: Secondary | ICD-10-CM | POA: Diagnosis not present

## 2020-09-03 DIAGNOSIS — D631 Anemia in chronic kidney disease: Secondary | ICD-10-CM | POA: Diagnosis not present

## 2020-09-03 DIAGNOSIS — D62 Acute posthemorrhagic anemia: Secondary | ICD-10-CM | POA: Diagnosis not present

## 2020-09-03 DIAGNOSIS — I5031 Acute diastolic (congestive) heart failure: Secondary | ICD-10-CM | POA: Diagnosis not present

## 2020-09-03 DIAGNOSIS — J9601 Acute respiratory failure with hypoxia: Secondary | ICD-10-CM | POA: Diagnosis not present

## 2020-09-06 ENCOUNTER — Other Ambulatory Visit: Payer: Self-pay

## 2020-09-06 ENCOUNTER — Ambulatory Visit (INDEPENDENT_AMBULATORY_CARE_PROVIDER_SITE_OTHER): Payer: Medicare PPO | Admitting: Podiatry

## 2020-09-06 DIAGNOSIS — E1142 Type 2 diabetes mellitus with diabetic polyneuropathy: Secondary | ICD-10-CM

## 2020-09-06 DIAGNOSIS — I509 Heart failure, unspecified: Secondary | ICD-10-CM | POA: Insufficient documentation

## 2020-09-06 DIAGNOSIS — Z8719 Personal history of other diseases of the digestive system: Secondary | ICD-10-CM | POA: Insufficient documentation

## 2020-09-06 DIAGNOSIS — R296 Repeated falls: Secondary | ICD-10-CM | POA: Insufficient documentation

## 2020-09-06 DIAGNOSIS — A09 Infectious gastroenteritis and colitis, unspecified: Secondary | ICD-10-CM | POA: Insufficient documentation

## 2020-09-06 DIAGNOSIS — Q208 Other congenital malformations of cardiac chambers and connections: Secondary | ICD-10-CM | POA: Insufficient documentation

## 2020-09-06 DIAGNOSIS — I2510 Atherosclerotic heart disease of native coronary artery without angina pectoris: Secondary | ICD-10-CM | POA: Insufficient documentation

## 2020-09-06 DIAGNOSIS — E11319 Type 2 diabetes mellitus with unspecified diabetic retinopathy without macular edema: Secondary | ICD-10-CM | POA: Insufficient documentation

## 2020-09-06 DIAGNOSIS — Z860100 Personal history of colon polyps, unspecified: Secondary | ICD-10-CM | POA: Insufficient documentation

## 2020-09-06 DIAGNOSIS — I872 Venous insufficiency (chronic) (peripheral): Secondary | ICD-10-CM | POA: Insufficient documentation

## 2020-09-06 DIAGNOSIS — F419 Anxiety disorder, unspecified: Secondary | ICD-10-CM | POA: Insufficient documentation

## 2020-09-06 DIAGNOSIS — N183 Chronic kidney disease, stage 3 unspecified: Secondary | ICD-10-CM | POA: Insufficient documentation

## 2020-09-06 DIAGNOSIS — R911 Solitary pulmonary nodule: Secondary | ICD-10-CM | POA: Insufficient documentation

## 2020-09-06 DIAGNOSIS — E1169 Type 2 diabetes mellitus with other specified complication: Secondary | ICD-10-CM | POA: Diagnosis not present

## 2020-09-06 DIAGNOSIS — R1010 Upper abdominal pain, unspecified: Secondary | ICD-10-CM | POA: Insufficient documentation

## 2020-09-06 DIAGNOSIS — B351 Tinea unguium: Secondary | ICD-10-CM | POA: Diagnosis not present

## 2020-09-06 DIAGNOSIS — Q2733 Arteriovenous malformation of digestive system vessel: Secondary | ICD-10-CM | POA: Insufficient documentation

## 2020-09-06 DIAGNOSIS — K2100 Gastro-esophageal reflux disease with esophagitis, without bleeding: Secondary | ICD-10-CM | POA: Insufficient documentation

## 2020-09-06 DIAGNOSIS — F339 Major depressive disorder, recurrent, unspecified: Secondary | ICD-10-CM | POA: Insufficient documentation

## 2020-09-06 DIAGNOSIS — M8588 Other specified disorders of bone density and structure, other site: Secondary | ICD-10-CM | POA: Insufficient documentation

## 2020-09-06 DIAGNOSIS — Z794 Long term (current) use of insulin: Secondary | ICD-10-CM | POA: Insufficient documentation

## 2020-09-06 DIAGNOSIS — G4700 Insomnia, unspecified: Secondary | ICD-10-CM | POA: Insufficient documentation

## 2020-09-06 DIAGNOSIS — I5030 Unspecified diastolic (congestive) heart failure: Secondary | ICD-10-CM | POA: Insufficient documentation

## 2020-09-06 DIAGNOSIS — Z95818 Presence of other cardiac implants and grafts: Secondary | ICD-10-CM | POA: Insufficient documentation

## 2020-09-06 DIAGNOSIS — K5901 Slow transit constipation: Secondary | ICD-10-CM | POA: Insufficient documentation

## 2020-09-06 NOTE — Progress Notes (Signed)
  Subjective:  Patient ID: [name], female    DOB: 09-03-1940,  MRN: [id number]  Chief Complaint  Patient presents with  . Diabetes    DFC  A1C-5.6 FBS - 101 PCP- no PCP     80 y.o. female presents with the above complaint. History confirmed with patient.   Objective:  Physical Exam: warm, good capillary refill, onychomycosis of the toenails with pain to palpation of the nailbeds, no trophic changes or ulcerative lesions, normal DP and PT pulses, and absent protective sensation. Xerosis bilat. Left Foot: normal exam, no swelling, tenderness, instability; ligaments intact, full range of motion of all ankle/foot joints  Right Foot: normal exam, no swelling, tenderness, instability; ligaments intact, full range of motion of all ankle/foot joints   Assessment:   1. Onychomycosis of multiple toenails with type 2 diabetes mellitus and peripheral neuropathy ([place])    Plan:  Patient was evaluated and treated and all questions answered.  Onychomycosis, Diabetes and DPN -Patient is diabetic with a qualifying condition for at risk foot care.  Procedure: Nail Debridement Type of Debridement: manual, sharp debridement. Instrumentation: Nail nipper, rotary burr. Number of Nails: 10  No follow-ups on file.

## 2020-09-07 ENCOUNTER — Other Ambulatory Visit (HOSPITAL_COMMUNITY)
Admission: RE | Admit: 2020-09-07 | Discharge: 2020-09-07 | Disposition: A | Discharge status: Home / Self Care | Payer: Medicare PPO | Source: Ambulatory Visit | Attending: Cardiovascular Disease | Admitting: Cardiovascular Disease

## 2020-09-07 DIAGNOSIS — Z20822 Contact with and (suspected) exposure to covid-19: Secondary | ICD-10-CM | POA: Diagnosis not present

## 2020-09-07 DIAGNOSIS — D62 Acute posthemorrhagic anemia: Secondary | ICD-10-CM | POA: Diagnosis not present

## 2020-09-07 DIAGNOSIS — I083 Combined rheumatic disorders of mitral, aortic and tricuspid valves: Secondary | ICD-10-CM | POA: Diagnosis not present

## 2020-09-07 DIAGNOSIS — I130 Hypertensive heart and chronic kidney disease with heart failure and stage 1 through stage 4 chronic kidney disease, or unspecified chronic kidney disease: Secondary | ICD-10-CM | POA: Diagnosis not present

## 2020-09-07 DIAGNOSIS — Z01812 Encounter for preprocedural laboratory examination: Secondary | ICD-10-CM | POA: Diagnosis not present

## 2020-09-07 DIAGNOSIS — E1122 Type 2 diabetes mellitus with diabetic chronic kidney disease: Secondary | ICD-10-CM | POA: Diagnosis not present

## 2020-09-07 DIAGNOSIS — I5031 Acute diastolic (congestive) heart failure: Secondary | ICD-10-CM | POA: Diagnosis not present

## 2020-09-07 DIAGNOSIS — N183 Chronic kidney disease, stage 3 unspecified: Secondary | ICD-10-CM | POA: Diagnosis not present

## 2020-09-07 DIAGNOSIS — D631 Anemia in chronic kidney disease: Secondary | ICD-10-CM | POA: Diagnosis not present

## 2020-09-07 DIAGNOSIS — J9601 Acute respiratory failure with hypoxia: Secondary | ICD-10-CM | POA: Diagnosis not present

## 2020-09-07 DIAGNOSIS — I0981 Rheumatic heart failure: Secondary | ICD-10-CM | POA: Diagnosis not present

## 2020-09-07 LAB — SARS CORONAVIRUS 2 (TAT 6-24 HRS): SARS Coronavirus 2: NEGATIVE

## 2020-09-09 ENCOUNTER — Ambulatory Visit (HOSPITAL_COMMUNITY): Payer: Medicare PPO | Admitting: Certified Registered Nurse Anesthetist

## 2020-09-09 ENCOUNTER — Encounter (HOSPITAL_COMMUNITY)
Admission: RE | Disposition: A | Discharge status: Home / Self Care | Payer: Self-pay | Source: Home / Self Care | Attending: Cardiovascular Disease

## 2020-09-09 ENCOUNTER — Encounter (HOSPITAL_COMMUNITY): Payer: Self-pay | Admitting: Cardiovascular Disease

## 2020-09-09 ENCOUNTER — Ambulatory Visit (HOSPITAL_COMMUNITY)
Admission: RE | Admit: 2020-09-09 | Discharge: 2020-09-09 | Disposition: A | Discharge status: Home / Self Care | Payer: Medicare PPO | Attending: Cardiovascular Disease | Admitting: Cardiovascular Disease

## 2020-09-09 ENCOUNTER — Other Ambulatory Visit: Payer: Self-pay

## 2020-09-09 ENCOUNTER — Ambulatory Visit (HOSPITAL_BASED_OUTPATIENT_CLINIC_OR_DEPARTMENT_OTHER)
Admission: RE | Admit: 2020-09-09 | Discharge: 2020-09-09 | Disposition: A | Discharge status: Home / Self Care | Payer: Medicare PPO | Source: Ambulatory Visit | Attending: Cardiovascular Disease | Admitting: Cardiovascular Disease

## 2020-09-09 DIAGNOSIS — D62 Acute posthemorrhagic anemia: Secondary | ICD-10-CM | POA: Diagnosis not present

## 2020-09-09 DIAGNOSIS — I4891 Unspecified atrial fibrillation: Secondary | ICD-10-CM | POA: Diagnosis not present

## 2020-09-09 DIAGNOSIS — Z79899 Other long term (current) drug therapy: Secondary | ICD-10-CM | POA: Diagnosis not present

## 2020-09-09 DIAGNOSIS — I4819 Other persistent atrial fibrillation: Secondary | ICD-10-CM | POA: Insufficient documentation

## 2020-09-09 DIAGNOSIS — I351 Nonrheumatic aortic (valve) insufficiency: Secondary | ICD-10-CM | POA: Insufficient documentation

## 2020-09-09 DIAGNOSIS — I480 Paroxysmal atrial fibrillation: Secondary | ICD-10-CM

## 2020-09-09 DIAGNOSIS — Z7902 Long term (current) use of antithrombotics/antiplatelets: Secondary | ICD-10-CM | POA: Insufficient documentation

## 2020-09-09 DIAGNOSIS — I340 Nonrheumatic mitral (valve) insufficiency: Secondary | ICD-10-CM | POA: Diagnosis not present

## 2020-09-09 DIAGNOSIS — Z885 Allergy status to narcotic agent status: Secondary | ICD-10-CM | POA: Insufficient documentation

## 2020-09-09 DIAGNOSIS — G4700 Insomnia, unspecified: Secondary | ICD-10-CM | POA: Diagnosis not present

## 2020-09-09 DIAGNOSIS — Z794 Long term (current) use of insulin: Secondary | ICD-10-CM | POA: Diagnosis not present

## 2020-09-09 DIAGNOSIS — E7800 Pure hypercholesterolemia, unspecified: Secondary | ICD-10-CM | POA: Diagnosis not present

## 2020-09-09 DIAGNOSIS — Z882 Allergy status to sulfonamides status: Secondary | ICD-10-CM | POA: Insufficient documentation

## 2020-09-09 DIAGNOSIS — Z888 Allergy status to other drugs, medicaments and biological substances status: Secondary | ICD-10-CM | POA: Insufficient documentation

## 2020-09-09 HISTORY — PX: CARDIOVERSION: SHX1299

## 2020-09-09 HISTORY — PX: TEE WITHOUT CARDIOVERSION: SHX5443

## 2020-09-09 LAB — POCT I-STAT, CHEM 8
BUN: 22 mg/dL (ref 8–23)
Calcium, Ion: 1.1 mmol/L — ABNORMAL LOW (ref 1.15–1.40)
Chloride: 100 mmol/L (ref 98–111)
Creatinine, Ser: 1 mg/dL (ref 0.44–1.00)
Glucose, Bld: 177 mg/dL — ABNORMAL HIGH (ref 70–99)
HCT: 43 % (ref 36.0–46.0)
Hemoglobin: 14.6 g/dL (ref 12.0–15.0)
Potassium: 4.2 mmol/L (ref 3.5–5.1)
Sodium: 138 mmol/L (ref 135–145)
TCO2: 32 mmol/L (ref 22–32)

## 2020-09-09 LAB — GLUCOSE, CAPILLARY: Glucose-Capillary: 168 mg/dL — ABNORMAL HIGH (ref 70–99)

## 2020-09-09 SURGERY — ECHOCARDIOGRAM, TRANSESOPHAGEAL
Anesthesia: General

## 2020-09-09 MED ORDER — LACTATED RINGERS IV SOLN: INTRAVENOUS | Status: DC | PRN

## 2020-09-09 MED ORDER — PHENYLEPHRINE 40 MCG/ML (10ML) SYRINGE FOR IV PUSH (FOR BLOOD PRESSURE SUPPORT)
PREFILLED_SYRINGE | INTRAVENOUS | Status: DC | PRN
  Administered 2020-09-09: 80 ug via INTRAVENOUS

## 2020-09-09 MED ORDER — PROPOFOL 500 MG/50ML IV EMUL
INTRAVENOUS | Status: DC | PRN
  Administered 2020-09-09: 125 ug/kg/min via INTRAVENOUS

## 2020-09-09 MED ORDER — BUTAMBEN-TETRACAINE-BENZOCAINE 2-2-14 % EX AERO
INHALATION_SPRAY | CUTANEOUS | Status: DC | PRN
  Administered 2020-09-09: 2 via TOPICAL

## 2020-09-09 MED ORDER — LIDOCAINE 2% (20 MG/ML) 5 ML SYRINGE
INTRAMUSCULAR | Status: DC | PRN
  Administered 2020-09-09: 40 mg via INTRAVENOUS
  Administered 2020-09-09: 60 mg via INTRAVENOUS

## 2020-09-09 MED ORDER — SODIUM CHLORIDE 0.9 % IV SOLN: INTRAVENOUS | Status: DC

## 2020-09-09 MED ORDER — PROPOFOL 10 MG/ML IV BOLUS
INTRAVENOUS | Status: DC | PRN
  Administered 2020-09-09: 20 mg via INTRAVENOUS

## 2020-09-09 MED ORDER — ONDANSETRON HCL 4 MG/2ML IJ SOLN
INTRAMUSCULAR | Status: DC | PRN
  Administered 2020-09-09: 4 mg via INTRAVENOUS

## 2020-09-09 NOTE — Anesthesia Procedure Notes (Signed)
Procedure Name: MAC Date/Time: 09/09/2020 9:46 AM Performed by: Dorthea Cove, CRNA Pre-anesthesia Checklist: Patient identified, Emergency Drugs available, Suction available, Patient being monitored and Timeout performed Patient Re-evaluated:Patient Re-evaluated prior to induction Oxygen Delivery Method: Nasal cannula Preoxygenation: Pre-oxygenation with 100% oxygen Induction Type: IV induction Placement Confirmation: positive ETCO2 and CO2 detector Dental Injury: Teeth and Oropharynx as per pre-operative assessment

## 2020-09-09 NOTE — Transfer of Care (Signed)
Immediate Anesthesia Transfer of Care Note  Patient: [name]  Procedure(s) Performed: TRANSESOPHAGEAL ECHOCARDIOGRAM (TEE) (N/A ) CARDIOVERSION (N/A )  Patient Location: Endoscopy Unit  Anesthesia Type:General  Level of Consciousness: awake, alert  and oriented  Airway & Oxygen Therapy: Patient Spontanous Breathing  Post-op Assessment: Report given to RN and Post -op Vital signs reviewed and stable  Post vital signs: Reviewed and stable  Last Vitals:  Vitals Value Taken Time  BP 113/31 09/09/20 1015  Temp    Pulse 63 09/09/20 1016  Resp 21 09/09/20 1016  SpO2 94 % 09/09/20 1016  Vitals shown include unvalidated device data.  Last Pain:  Vitals:   09/09/20 0920  TempSrc:   PainSc: 0-No pain         Complications: No complications documented.

## 2020-09-09 NOTE — Progress Notes (Signed)
  Echocardiogram Echocardiogram Transesophageal has been performed.  Shae Hinnenkamp G Karstyn Birkey 09/09/2020, 10:35 AM

## 2020-09-09 NOTE — Anesthesia Postprocedure Evaluation (Signed)
Anesthesia Post Note  Patient: [name]  Procedure(s) Performed: TRANSESOPHAGEAL ECHOCARDIOGRAM (TEE) (N/A ) CARDIOVERSION (N/A )     Patient location during evaluation: PACU Anesthesia Type: General Level of consciousness: awake and alert and oriented Pain management: pain level controlled Vital Signs Assessment: post-procedure vital signs reviewed and stable Respiratory status: spontaneous breathing, nonlabored ventilation and respiratory function stable Cardiovascular status: blood pressure returned to baseline and stable Postop Assessment: no apparent nausea or vomiting Anesthetic complications: no   No complications documented.  Last Vitals:  Vitals:   09/09/20 1014 09/09/20 1015  BP:  (!) 113/31  Pulse:  63  Resp:  20  Temp: 36.4 C   SpO2:  94%    Last Pain:  Vitals:   09/09/20 1015  TempSrc:   PainSc: 0-No pain                 Aquiles Ruffini A.

## 2020-09-09 NOTE — Interval H&P Note (Signed)
History and Physical Interval Note:  09/09/2020 8:25 AM  [name]  has presented today for surgery, with the diagnosis of AFIB.  The various methods of treatment have been discussed with the patient and family. After consideration of risks, benefits and other options for treatment, the patient has consented to  Procedure(s): TRANSESOPHAGEAL ECHOCARDIOGRAM (TEE) (N/A) CARDIOVERSION (N/A) as a surgical intervention.  The patient's history has been reviewed, patient examined, no change in status, stable for surgery.  I have reviewed the patient's chart and labs.  Questions were answered to the patient's satisfaction.     Jenkins Rouge

## 2020-09-09 NOTE — Anesthesia Preprocedure Evaluation (Addendum)
Anesthesia Evaluation  Patient identified by MRN, date of birth, ID band Patient awake    Reviewed: Allergy & Precautions, NPO status , Patient's Chart, lab work & pertinent test results, reviewed documented beta blocker date and time   History of Anesthesia Complications (+) PONV and history of anesthetic complications  Airway Mallampati: II  TM Distance: >3 FB Neck ROM: Full    Dental  (+) Edentulous Upper, Edentulous Lower   Pulmonary asthma ,    Pulmonary exam normal breath sounds clear to auscultation       Cardiovascular hypertension, Pt. on medications and Pt. on home beta blockers + CAD, + Peripheral Vascular Disease and +CHF  + dysrhythmias Atrial Fibrillation + Valvular Problems/Murmurs  Rhythm:Irregular Rate:Normal  EKG 08/31/20 Atrial flutter Currently in atrial fibrillation on monitor 09/09/20  Echo 11.16.21  1. Left ventricular ejection fraction, by estimation, is 55 to 60%. The left ventricle has normal function. The left ventricle has no regional wall motion abnormalities. There is mild asymmetric left ventricular hypertrophy of the posterior-lateral segment. Left ventricular diastolic parameters are indeterminate. Elevated left ventricular end-diastolic pressure.  2. Right ventricular systolic function is mildly reduced. The right  ventricular size is mildly enlarged. There is normal pulmonary artery systolic pressure. The estimated right ventricular systolic pressure is 38.9 mmHg.  3. Left atrial size was moderately dilated.  4. Right atrial size was severely dilated.  5. The mitral valve is abnormal. Trivial mitral valve regurgitation. Moderate mitral annular calcification.  6. Tricuspid valve regurgitation is mild to moderate.  7. The aortic valve is grossly normal. Aortic valve regurgitation is not visualized. No aortic stenosis is present.  8. The inferior vena cava is normal in size with greater than 50%  respiratory variability, suggesting right atrial pressure of 3 mmHg.  9. S/p 27 mm Watchman FLX 07/01/20, which is not well visualized or assessed on this transthoracic echo.   S/P Watchman procedure 11/21   Neuro/Psych PSYCHIATRIC DISORDERS Anxiety Depression Diabetic peripheral neuropathy  Neuromuscular disease    GI/Hepatic Neg liver ROS, hiatal hernia, GERD  Medicated and Controlled,  Endo/Other  diabetes, Poorly Controlled, Type 2, Oral Hypoglycemic AgentsHyperlipidemia  Renal/GU Renal InsufficiencyRenal disease  negative genitourinary   Musculoskeletal  (+) Arthritis , Osteoarthritis,  Chronic back pain S/P spinal cord stimulator S/P Thoracic kyphoplasty   Abdominal   Peds  Hematology  (+) anemia , Plavix therapy-last dose this am   Anesthesia Other Findings   Reproductive/Obstetrics                            Anesthesia Physical  Anesthesia Plan  ASA: III  Anesthesia Plan: General   Post-op Pain Management:    Induction: Intravenous  PONV Risk Score and Plan: 4 or greater and Treatment may vary due to age or medical condition and Propofol infusion  Airway Management Planned: Nasal Cannula and Natural Airway  Additional Equipment:   Intra-op Plan:   Post-operative Plan:   Informed Consent: I have reviewed the patients History and Physical, chart, labs and discussed the procedure including the risks, benefits and alternatives for the proposed anesthesia with the patient or authorized representative who has indicated his/her understanding and acceptance.     Dental advisory given  Plan Discussed with: CRNA and Anesthesiologist  Anesthesia Plan Comments:        Anesthesia Quick Evaluation

## 2020-09-09 NOTE — Discharge Instructions (Signed)
Monitored Anesthesia Care, Care After This sheet gives you information about how to care for yourself after your procedure. Your health care provider may also give you more specific instructions. If you have problems or questions, contact your health care provider. What can I expect after the procedure? After the procedure, it is common to have:  Tiredness.  Forgetfulness about what happened after the procedure.  Impaired judgment for important decisions.  Nausea or vomiting.  Some difficulty with balance. Follow these instructions at home: For the time period you were told by your health care provider:  Rest as needed.  Do not participate in activities where you could fall or become injured.  Do not drive or use machinery.  Do not drink alcohol.  Do not take sleeping pills or medicines that cause drowsiness.  Do not make important decisions or sign legal documents.  Do not take care of children on your own.      Eating and drinking  Follow the diet that is recommended by your health care provider.  Drink enough fluid to keep your urine pale yellow.  If you vomit: ? Drink water, juice, or soup when you can drink without vomiting. ? Make sure you have little or no nausea before eating solid foods. General instructions  Have a responsible adult stay with you for the time you are told. It is important to have someone help care for you until you are awake and alert.  Take over-the-counter and prescription medicines only as told by your health care provider.  If you have sleep apnea, surgery and certain medicines can increase your risk for breathing problems. Follow instructions from your health care provider about wearing your sleep device: ? Anytime you are sleeping, including during daytime naps. ? While taking prescription pain medicines, sleeping medicines, or medicines that make you drowsy.  Avoid smoking.  Keep all follow-up visits as told by your health care  provider. This is important. Contact a health care provider if:  You keep feeling nauseous or you keep vomiting.  You feel light-headed.  You are still sleepy or having trouble with balance after 24 hours.  You develop a rash.  You have a fever.  You have redness or swelling around the IV site. Get help right away if:  You have trouble breathing.  You have new-onset confusion at home. Summary  For several hours after your procedure, you may feel tired. You may also be forgetful and have poor judgment.  Have a responsible adult stay with you for the time you are told. It is important to have someone help care for you until you are awake and alert.  Rest as told. Do not drive or operate machinery. Do not drink alcohol or take sleeping pills.  Get help right away if you have trouble breathing, or if you suddenly become confused. This information is not intended to replace advice given to you by your health care provider. Make sure you discuss any questions you have with your health care provider. Document Revised: 04/29/2020 Document Reviewed: 07/17/2019 Elsevier Patient Education  2021 [employer]. Hospital doctor cardioversion is the delivery of a jolt of electricity to restore a normal rhythm to the heart. A rhythm that is too fast or is not regular keeps the heart from pumping well. In this procedure, sticky patches or metal paddles are placed on the chest to deliver electricity to the heart from a device. This procedure may be done in an emergency if:  There is  low or no blood pressure as a result of the heart rhythm.  Normal rhythm must be restored as fast as possible to protect the brain and heart from further damage.  It may save a life. This may also be a scheduled procedure for irregular or fast heart rhythms that are not immediately life-threatening. Tell a health care provider about:  Any allergies you have.  All medicines you are taking,  including vitamins, herbs, eye drops, creams, and over-the-counter medicines.  Any problems you or family members have had with anesthetic medicines.  Any blood disorders you have.  Any surgeries you have had.  Any medical conditions you have.  Whether you are pregnant or may be pregnant. What are the risks? Generally, this is a safe procedure. However, problems may occur, including:  Allergic reactions to medicines.  A blood clot that breaks free and travels to other parts of your body.  The possible return of an abnormal heart rhythm within hours or days after the procedure.  Your heart stopping (cardiac arrest). This is rare. What happens before the procedure? Medicines  Your health care provider may have you start taking: ? Blood-thinning medicines (anticoagulants) so your blood does not clot as easily. ? Medicines to help stabilize your heart rate and rhythm.  Ask your health care provider about: ? Changing or stopping your regular medicines. This is especially important if you are taking diabetes medicines or blood thinners. ? Taking medicines such as aspirin and ibuprofen. These medicines can thin your blood. Do not take these medicines unless your health care provider tells you to take them. ? Taking over-the-counter medicines, vitamins, herbs, and supplements. General instructions  Follow instructions from your health care provider about eating or drinking restrictions.  Plan to have someone take you home from the hospital or clinic.  If you will be going home right after the procedure, plan to have someone with you for 24 hours.  Ask your health care provider what steps will be taken to help prevent infection. These may include washing your skin with a germ-killing soap. What happens during the procedure?  An IV will be inserted into one of your veins.  Sticky patches (electrodes) or metal paddles may be placed on your chest.  You will be given a medicine to  help you relax (sedative).  An electrical shock will be delivered. The procedure may vary among health care providers and hospitals.   What can I expect after the procedure?  Your blood pressure, heart rate, breathing rate, and blood oxygen level will be monitored until you leave the hospital or clinic.  Your heart rhythm will be watched to make sure it does not change.  You may have some redness on the skin where the shocks were given. Follow these instructions at home:  Do not drive for 24 hours if you were given a sedative during your procedure.  Take over-the-counter and prescription medicines only as told by your health care provider.  Ask your health care provider how to check your pulse. Check it often.  Rest for 48 hours after the procedure or as told by your health care provider.  Avoid or limit your caffeine use as told by your health care provider.  Keep all follow-up visits as told by your health care provider. This is important. Contact a health care provider if:  You feel like your heart is beating too quickly or your pulse is not regular.  You have a serious muscle cramp that does not  go away. Get help right away if:  You have discomfort in your chest.  You are dizzy or you feel faint.  You have trouble breathing or you are short of breath.  Your speech is slurred.  You have trouble moving an arm or leg on one side of your body.  Your fingers or toes turn cold or blue. Summary  Electrical cardioversion is the delivery of a jolt of electricity to restore a normal rhythm to the heart.  This procedure may be done right away in an emergency or may be a scheduled procedure if the condition is not an emergency.  Generally, this is a safe procedure.  After the procedure, check your pulse often as told by your health care provider. This information is not intended to replace advice given to you by your health care provider. Make sure you discuss any questions  you have with your health care provider. Document Revised: 03/17/2019 Document Reviewed: 03/17/2019 Elsevier Patient Education  [place].

## 2020-09-09 NOTE — CV Procedure (Addendum)
TEE/DCC  3D imaging of watchman device  27 mm FLX occludes appendage with good seal No thrombus on device or in RA/LA/RAA  Mild MR Mild AR Normal EF 55-60% Mild TR Moderate bi-atrial enlargement   DCC x 1 150J converted to NSR On Plavix only per Dr Quentin Ore  No immediate neurologic sequelae  Jenkins Rouge MD [facility]

## 2020-09-10 DIAGNOSIS — D631 Anemia in chronic kidney disease: Secondary | ICD-10-CM | POA: Diagnosis not present

## 2020-09-10 DIAGNOSIS — I5031 Acute diastolic (congestive) heart failure: Secondary | ICD-10-CM | POA: Diagnosis not present

## 2020-09-10 DIAGNOSIS — I0981 Rheumatic heart failure: Secondary | ICD-10-CM | POA: Diagnosis not present

## 2020-09-10 DIAGNOSIS — I130 Hypertensive heart and chronic kidney disease with heart failure and stage 1 through stage 4 chronic kidney disease, or unspecified chronic kidney disease: Secondary | ICD-10-CM | POA: Diagnosis not present

## 2020-09-10 DIAGNOSIS — J9601 Acute respiratory failure with hypoxia: Secondary | ICD-10-CM | POA: Diagnosis not present

## 2020-09-10 DIAGNOSIS — N183 Chronic kidney disease, stage 3 unspecified: Secondary | ICD-10-CM | POA: Diagnosis not present

## 2020-09-10 DIAGNOSIS — E1122 Type 2 diabetes mellitus with diabetic chronic kidney disease: Secondary | ICD-10-CM | POA: Diagnosis not present

## 2020-09-10 DIAGNOSIS — D62 Acute posthemorrhagic anemia: Secondary | ICD-10-CM | POA: Diagnosis not present

## 2020-09-10 DIAGNOSIS — I083 Combined rheumatic disorders of mitral, aortic and tricuspid valves: Secondary | ICD-10-CM | POA: Diagnosis not present

## 2020-09-10 NOTE — Addendum Note (Signed)
Addendum  created 09/10/20 1257 by Josephine Igo, MD   SmartForm saved

## 2020-09-13 ENCOUNTER — Encounter (HOSPITAL_COMMUNITY): Payer: Self-pay | Admitting: Cardiovascular Disease

## 2020-09-14 DIAGNOSIS — I5020 Unspecified systolic (congestive) heart failure: Secondary | ICD-10-CM | POA: Diagnosis not present

## 2020-09-15 DIAGNOSIS — I083 Combined rheumatic disorders of mitral, aortic and tricuspid valves: Secondary | ICD-10-CM | POA: Diagnosis not present

## 2020-09-15 DIAGNOSIS — E1122 Type 2 diabetes mellitus with diabetic chronic kidney disease: Secondary | ICD-10-CM | POA: Diagnosis not present

## 2020-09-15 DIAGNOSIS — I5031 Acute diastolic (congestive) heart failure: Secondary | ICD-10-CM | POA: Diagnosis not present

## 2020-09-15 DIAGNOSIS — I130 Hypertensive heart and chronic kidney disease with heart failure and stage 1 through stage 4 chronic kidney disease, or unspecified chronic kidney disease: Secondary | ICD-10-CM | POA: Diagnosis not present

## 2020-09-15 DIAGNOSIS — N183 Chronic kidney disease, stage 3 unspecified: Secondary | ICD-10-CM | POA: Diagnosis not present

## 2020-09-15 DIAGNOSIS — D631 Anemia in chronic kidney disease: Secondary | ICD-10-CM | POA: Diagnosis not present

## 2020-09-15 DIAGNOSIS — I0981 Rheumatic heart failure: Secondary | ICD-10-CM | POA: Diagnosis not present

## 2020-09-15 DIAGNOSIS — J9601 Acute respiratory failure with hypoxia: Secondary | ICD-10-CM | POA: Diagnosis not present

## 2020-09-15 DIAGNOSIS — D62 Acute posthemorrhagic anemia: Secondary | ICD-10-CM | POA: Diagnosis not present

## 2020-09-16 ENCOUNTER — Other Ambulatory Visit: Payer: Self-pay

## 2020-09-16 ENCOUNTER — Other Ambulatory Visit: Payer: Medicare PPO

## 2020-09-20 DIAGNOSIS — E1142 Type 2 diabetes mellitus with diabetic polyneuropathy: Secondary | ICD-10-CM | POA: Diagnosis not present

## 2020-09-23 DIAGNOSIS — H353131 Nonexudative age-related macular degeneration, bilateral, early dry stage: Secondary | ICD-10-CM | POA: Diagnosis not present

## 2020-09-23 DIAGNOSIS — H43393 Other vitreous opacities, bilateral: Secondary | ICD-10-CM | POA: Diagnosis not present

## 2020-09-23 DIAGNOSIS — H04123 Dry eye syndrome of bilateral lacrimal glands: Secondary | ICD-10-CM | POA: Diagnosis not present

## 2020-09-23 DIAGNOSIS — Z961 Presence of intraocular lens: Secondary | ICD-10-CM | POA: Diagnosis not present

## 2020-09-23 DIAGNOSIS — E119 Type 2 diabetes mellitus without complications: Secondary | ICD-10-CM | POA: Diagnosis not present

## 2020-10-12 ENCOUNTER — Ambulatory Visit (INDEPENDENT_AMBULATORY_CARE_PROVIDER_SITE_OTHER): Payer: Medicare PPO

## 2020-10-12 ENCOUNTER — Ambulatory Visit (INDEPENDENT_AMBULATORY_CARE_PROVIDER_SITE_OTHER): Payer: Medicare PPO | Admitting: Cardiology

## 2020-10-12 ENCOUNTER — Encounter: Payer: Self-pay | Admitting: Cardiology

## 2020-10-12 ENCOUNTER — Other Ambulatory Visit: Payer: Self-pay

## 2020-10-12 VITALS — BP 142/60 | HR 84 | Ht 64.0 in | Wt 148.0 lb

## 2020-10-12 DIAGNOSIS — I10 Essential (primary) hypertension: Secondary | ICD-10-CM | POA: Diagnosis not present

## 2020-10-12 DIAGNOSIS — I480 Paroxysmal atrial fibrillation: Secondary | ICD-10-CM

## 2020-10-12 DIAGNOSIS — I4819 Other persistent atrial fibrillation: Secondary | ICD-10-CM

## 2020-10-12 DIAGNOSIS — R404 Transient alteration of awareness: Secondary | ICD-10-CM | POA: Diagnosis not present

## 2020-10-12 MED ORDER — AMIODARONE HCL 200 MG PO TABS: ORAL_TABLET | ORAL | 3 refills | Status: DC

## 2020-10-12 NOTE — Patient Instructions (Addendum)
Medication Instructions:  Your physician has recommended you make the following change in your medication:   1.  You will start taking amiodarone 200 mg--  A.  Take 2 tablets (400 mg) by mouth TWICE a day for 7 days THEN  B.  Take 2 tablets (400 mg) by mouth ONCE a day for 7 days THEN  C.  Take 1 tablet (200 mg) by mouth ONCE a day from THEN ON.   Labwork: None ordered.  Testing/Procedures: Your physician has recommended that you wear a holter monitor. Holter monitors are medical devices that record the heart's electrical activity. Doctors most often use these monitors to diagnose arrhythmias. Arrhythmias are problems with the speed or rhythm of the heartbeat. The monitor is a small, portable device. You can wear one while you do your normal daily activities. This is usually used to diagnose what is causing palpitations/syncope (passing out).  You will wear a 7 day ZIO monitor  Follow-Up: Your physician wants you to follow-up in: 4 weeks with Dr. Quentin Ore.     Any Other Special Instructions Will Be Listed Below (If Applicable).  If you need a refill on your cardiac medications before your next appointment, please call your pharmacy.   Amiodarone tablets What is this medicine? AMIODARONE (a MEE oh da rone) is an antiarrhythmic drug. It helps make your heart beat regularly. Because of the side effects caused by this medicine, it is only used when other medicines have not worked. It is usually used for heartbeat problems that may be life threatening. This medicine may be used for other purposes; ask your health care provider or pharmacist if you have questions. COMMON BRAND NAME(S): Cordarone, Pacerone What should I tell my health care provider before I take this medicine? They need to know if you have any of these conditions:  liver disease  lung disease  other heart problems  thyroid disease  an unusual or allergic reaction to amiodarone, iodine, other medicines, foods, dyes, or  preservatives  pregnant or trying to get pregnant  breast-feeding How should I use this medicine? Take this medicine by mouth with a glass of water. Follow the directions on the prescription label. You can take this medicine with or without food. However, you should always take it the same way each time. Take your doses at regular intervals. Do not take your medicine more often than directed. Do not stop taking except on the advice of your doctor or health care professional. A special MedGuide will be given to you by the pharmacist with each prescription and refill. Be sure to read this information carefully each time. Talk to your pediatrician regarding the use of this medicine in children. Special care may be needed. Overdosage: If you think you have taken too much of this medicine contact a poison control center or emergency room at once. NOTE: This medicine is only for you. Do not share this medicine with others. What if I miss a dose? If you miss a dose, take it as soon as you can. If it is almost time for your next dose, take only that dose. Do not take double or extra doses. What may interact with this medicine? Do not take this medicine with any of the following medications:  abarelix  apomorphine  arsenic trioxide  certain antibiotics like erythromycin, gemifloxacin, levofloxacin, pentamidine  certain medicines for depression like amoxapine, tricyclic antidepressants  certain medicines for fungal infections like fluconazole, itraconazole, ketoconazole, posaconazole, voriconazole  certain medicines for irregular heart beat  like disopyramide, dronedarone, ibutilide, propafenone, sotalol  certain medicines for malaria like chloroquine, halofantrine  cisapride  droperidol  haloperidol  [place]  maprotiline  methadone  phenothiazines like chlorpromazine, mesoridazine, thioridazine  pimozide  ranolazine  red yeast rice  vardenafil This medicine may also  interact with the following medications:  antiviral medicines for HIV or AIDS  certain medicines for blood pressure, heart disease, irregular heart beat  certain medicines for cholesterol like atorvastatin, cerivastatin, lovastatin, simvastatin  certain medicines for hepatitis C like sofosbuvir and ledipasvir; sofosbuvir  certain medicines for seizures like phenytoin  certain medicines for thyroid problems  certain medicines that treat or prevent blood clots like warfarin  cholestyramine  cimetidine  clopidogrel  cyclosporine  dextromethorphan  diuretics  dofetilide  fentanyl  general anesthetics  grapefruit juice  lidocaine  loratadine  methotrexate  other medicines that prolong the QT interval (cause an abnormal heart rhythm)  procainamide  quinidine  rifabutin, rifampin, or rifapentine  St. [name]'s Wort  trazodone  ziprasidone This list may not describe all possible interactions. Give your health care provider a list of all the medicines, herbs, non-prescription drugs, or dietary supplements you use. Also tell them if you smoke, drink alcohol, or use illegal drugs. Some items may interact with your medicine. What should I watch for while using this medicine? Your condition will be monitored closely when you first begin therapy. Often, this drug is first started in a hospital or other monitored health care setting. Once you are on maintenance therapy, visit your doctor or health care professional for regular checks on your progress. Because your condition and use of this medicine carry some risk, it is a good idea to carry an identification card, necklace or bracelet with details of your condition, medications, and doctor or health care professional. Dennis Bast may get drowsy or dizzy. Do not drive, use machinery, or do anything that needs mental alertness until you know how this medicine affects you. Do not stand or sit up quickly, especially if you are an older  patient. This reduces the risk of dizzy or fainting spells. This medicine can make you more sensitive to the sun. Keep out of the sun. If you cannot avoid being in the sun, wear protective clothing and use sunscreen. Do not use sun lamps or tanning beds/booths. You should have regular eye exams before and during treatment. Call your doctor if you have blurred vision, see halos, or your eyes become sensitive to light. Your eyes may get dry. It may be helpful to use a lubricating eye solution or artificial tears solution. If you are going to have surgery or a procedure that requires contrast dyes, tell your doctor or health care professional that you are taking this medicine. What side effects may I notice from receiving this medicine? Side effects that you should report to your doctor or health care professional as soon as possible:  allergic reactions like skin rash, itching or hives, swelling of the face, lips, or tongue  blue-gray coloring of the skin  blurred vision, seeing blue green halos, increased sensitivity of the eyes to light  breathing problems  chest pain  dark urine  fast, irregular heartbeat  feeling faint or light-headed  intolerance to heat or cold  nausea or vomiting  pain and swelling of the scrotum  pain, tingling, numbness in feet, hands  redness, blistering, peeling or loosening of the skin, including inside the mouth  spitting up blood  stomach pain  sweating  unusual or uncontrolled  movements of body  unusually weak or tired  weight gain or loss  yellowing of the eyes or skin Side effects that usually do not require medical attention (report to your doctor or health care professional if they continue or are bothersome):  change in sex drive or performance  constipation  dizziness  headache  loss of appetite  trouble sleeping This list may not describe all possible side effects. Call your doctor for medical advice about side effects.  You may report side effects to FDA at 1-800-FDA-1088. Where should I keep my medicine? Keep out of the reach of children. Store at room temperature between 20 and 25 degrees C (68 and 77 degrees F). Protect from light. Keep container tightly closed. Throw away any unused medicine after the expiration date. NOTE: This sheet is a summary. It may not cover all possible information. If you have questions about this medicine, talk to your doctor, pharmacist, or health care provider.  2021 Elsevier/Gold Standard (2018-07-17 13:44:04)

## 2020-10-12 NOTE — Progress Notes (Signed)
Electrophysiology Office Follow up Visit Note:    Date:  10/12/2020   ID:  [name], DOB December 22, 1940, MRN [id number]  PCP:  Aretta Nip, MD  [place] Cardiologist:  Candee Furbish, MD  [facility] Electrophysiologist:  Vickie Epley, MD    Interval History:    [name] is a 80 y.o. female who presents for a follow up visit. They were last seen in clinic August 31, 2020.  At that appointment she was in atrial fibrillation and we scheduled a TEE cardioversion.  This was performed on September 09, 2020.  Unfortunately, shortly after the cardioversion she returned to atrial fibrillation.  She is been doing relatively well since this point despite being in atrial fibrillation until this past Saturday when she was at home playing cards with her husband.  In middle of the card game she became unresponsive.  According to [name], she was awake but extremely lethargic.  According to her husband, she usually takes an afternoon nap but that day because friends were over she had not been able to do so.  She does not describe any preceding symptoms before partially losing consciousness although the details are little hard to decipher around the time of the event.  According to the husband, after she became very lethargic she has to take a shower and so she was helped to the bathroom where she took a shower and then laid down and slept for 12 hours.  When she woke up from sleeping 12 hours, her symptoms had resolved and she was back to her normal state of health.  She tells me that at times she is waking up from sleep with really rapid strong heartbeats.  She continues to take Plavix and is tolerating this medication without bleeding problems.     Past Medical History:  Diagnosis Date  . Acute GI bleeding 05/13/2015  . Anxiety   . Arthritis   . Asthma   . Atrial fibrillation ([place]) 10/29/2017  . Bakers cyst, left   . Chronic kidney disease    stage 3  . Constipation   .  Diabetes mellitus    type 2  . Dysrhythmia   . GERD (gastroesophageal reflux disease)   . Heart murmur   . History of cellulitis   . History of dizziness   . History of hiatal hernia    tiny 06/15/2016  . Hypertension   . Iron deficiency anemia   . Peripheral vascular disease ([place])   . PONV (postoperative nausea and vomiting)   . Pulmonary nodule     Past Surgical History:  Procedure Laterality Date  . APPENDECTOMY  1952  . BIOPSY  10/16/2018   Procedure: BIOPSY;  Surgeon: Clarene Essex, MD;  Location: [facility]COPY;  Service: Endoscopy;;  . BIOPSY  11/26/2019   Procedure: BIOPSY;  Surgeon: Clarene Essex, MD;  Location: [facility];  Service: Endoscopy;;  . BIOPSY  07/14/2020   Procedure: BIOPSY;  Surgeon: Ronald Lobo, MD;  Location: [facility];  Service: Endoscopy;;  . CARDIOVERSION N/A 09/09/2020   Procedure: CARDIOVERSION;  Surgeon: Josue Hector, MD;  Location: [facility] ENDOSCOPY;  Service: Cardiovascular;  Laterality: N/A;  . COLONOSCOPY W/ POLYPECTOMY  05/15/2013  . ESOPHAGOGASTRODUODENOSCOPY (EGD) WITH PROPOFOL N/A 06/15/2016   Procedure: ESOPHAGOGASTRODUODENOSCOPY (EGD) WITH PROPOFOL;  Surgeon: Clarene Essex, MD;  [facility]: [facility];  Service: Endoscopy;  Laterality: N/A;  . ESOPHAGOGASTRODUODENOSCOPY (EGD) WITH PROPOFOL N/A 10/16/2018   Procedure: ESOPHAGOGASTRODUODENOSCOPY (EGD) WITH PROPOFOL;  Surgeon: Clarene Essex,  MD;  [facility];  Service: Endoscopy;  Laterality: N/A;  . ESOPHAGOGASTRODUODENOSCOPY (EGD) WITH PROPOFOL N/A 11/26/2019   Procedure: ESOPHAGOGASTRODUODENOSCOPY (EGD) WITH PROPOFOL;  Surgeon: Clarene Essex, MD;  [facility]: [facility];  Service: Endoscopy;  Laterality: N/A;  . ESOPHAGOGASTRODUODENOSCOPY (EGD) WITH PROPOFOL N/A 07/14/2020   Procedure: ESOPHAGOGASTRODUODENOSCOPY (EGD) WITH PROPOFOL;  Surgeon: Ronald Lobo, MD;  [facility]: [facility];  Service: Endoscopy;  Laterality: N/A;  . EYE SURGERY Bilateral    cataracts removed  . GI  RADIOFREQUENCY ABLATION  10/16/2018   Procedure: GI RADIOFREQUENCY ABLATION;  Surgeon: Clarene Essex, MD;  Location: [facility];  Service: Endoscopy;;  . GI RADIOFREQUENCY ABLATION N/A 11/26/2019   Procedure: GI RADIOFREQUENCY ABLATION;  Surgeon: Clarene Essex, MD;  [facility];  Service: Endoscopy;  Laterality: N/A;  . HAND SURGERY Left 2010   operated on index and ring finger  . HOT HEMOSTASIS N/A 07/14/2020   Procedure: HOT HEMOSTASIS (ARGON PLASMA COAGULATION/BICAP);  Surgeon: Ronald Lobo, MD;  Location: [facility];  Service: Endoscopy;  Laterality: N/A;  . INCONTINENCE SURGERY    . JOINT REPLACEMENT Right    knee  . KNEE ARTHROSCOPY  2003 2005   right knee  . KYPHOPLASTY N/A 01/29/2020   Procedure: T8 KYPHOPLASTY;  Surgeon: Melina Schools, MD;  Location: [place];  Service: Orthopedics;  Laterality: N/A;  60 mins Local with [facility]  . LEFT ATRIAL APPENDAGE OCCLUSION N/A 07/01/2020   Procedure: LEFT ATRIAL APPENDAGE OCCLUSION;  Surgeon: Vickie Epley, MD;  Location: [place] [facility];  Service: Cardiovascular;  Laterality: N/A;  . mass fallopian tube    . SPINAL CORD STIMULATOR BATTERY EXCHANGE N/A 09/09/2015   Procedure: SPINAL CORD STIMULATOR BATTERY EXCHANGE;  Surgeon: Melina Schools, MD;  Location: [place];  Service: Orthopedics;  Laterality: N/A;  . TEE WITHOUT CARDIOVERSION N/A 08/18/2020   Procedure: TRANSESOPHAGEAL ECHOCARDIOGRAM (TEE);  Surgeon: Josue Hector, MD;  Location[facility] ENDOSCOPY;  Service: Cardiovascular;  Laterality: N/A;  . TEE WITHOUT CARDIOVERSION N/A 09/09/2020   Procedure: TRANSESOPHAGEAL ECHOCARDIOGRAM (TEE);  Surgeon: Josue Hector, MD;  Location: [facility];  Service: Cardiovascular;  Laterality: N/A;  . TOTAL KNEE ARTHROPLASTY Left 11/05/2017   Procedure: LEFT TOTAL KNEE ARTHROPLASTY;  Surgeon: Paralee Cancel, MD;  Location: [facility];  Service: Orthopedics;  Laterality: Left;  Adductor Block  . VAGINAL HYSTERECTOMY  1977   1 ovary removed     Current Medications: Current Meds  Medication Sig  . acetaminophen (TYLENOL) 500 MG tablet Take 1,000 mg by mouth every 8 (eight) hours as needed for mild pain, moderate pain or headache.   . albuterol (PROAIR HFA) 108 (90 BASE) MCG/ACT inhaler Inhale 2 puffs into the lungs every 4 (four) hours as needed for wheezing or shortness of breath.  . ALPRAZolam (XANAX) 0.25 MG tablet Take 0.25 mg by mouth at bedtime.   Marland Kitchen amiodarone (PACERONE) 200 MG tablet Take 2 tablets (400 mg total) by mouth 2 (two) times daily for 7 days, THEN 2 tablets (400 mg total) daily for 7 days, THEN 1 tablet (200 mg total) daily.  . clopidogrel (PLAVIX) 75 MG tablet Take 75 mg by mouth daily.  . Continuous Blood Gluc Sensor (FREESTYLE LIBRE 14 DAY SENSOR) MISC Inject 1 patch into the skin every 14 (fourteen) days.  . diphenhydrAMINE (BENADRYL) 25 MG tablet Take 25 mg by mouth daily as needed for allergies.  Marland Kitchen docusate sodium (COLACE) 100 MG capsule Take 100 mg by mouth in the morning and at bedtime.  Marland Kitchen  DROPLET PEN NEEDLES 32G X 4 MM MISC   . esomeprazole (NEXIUM) 40 MG capsule Take 40 mg by mouth in the morning and at bedtime.   . fluticasone (FLONASE) 50 MCG/ACT nasal spray Place 1 spray into both nostrils daily as needed for allergies.   . furosemide (LASIX) 40 MG tablet Take 1 tablet (40 mg total) by mouth daily.  Marland Kitchen gabapentin (NEURONTIN) 300 MG capsule Take 600 mg by mouth in the morning, at noon, and at bedtime.   . insulin glargine (LANTUS) 100 UNIT/ML Solostar Pen Inject 30 Units into the skin at bedtime.   . Iron-FA-B Cmp-C-Biot-Probiotic (FUSION PLUS) CAPS Take 1 capsule by mouth every evening.   . magnesium oxide (MAG-OX) 400 (241.3 Mg) MG tablet Take 0.5 tablets (200 mg total) by mouth 2 (two) times daily.  . metoprolol tartrate (LOPRESSOR) 100 MG tablet Take 1 tablet (100 mg total) by mouth 2 (two) times daily.  . Multiple Vitamins-Minerals (PRESERVISION AREDS 2 PO) Take 1 capsule by mouth 2 (two) times  daily.   Marland Kitchen NOVOLOG FLEXPEN 100 UNIT/ML FlexPen Inject 8-12 Units into the skin See admin instructions. Inject 8-12 units into the skin, PER SLIDING SCALE, in the morning, noon, and bedtime  . OXYGEN Inhale 2.5 L/min into the lungs as needed (for shortness of breath).  . Pitavastatin Calcium (LIVALO) 1 MG TABS Take 1 mg by mouth at bedtime.  . potassium chloride SA (KLOR-CON) 20 MEQ tablet Take 2 tablets (40 mEq total) by mouth daily. (Patient taking differently: Take 20 mEq by mouth 2 (two) times daily.)  . sodium chloride (OCEAN) 0.65 % SOLN nasal spray Place 1 spray into both nostrils as needed for congestion.  Marland Kitchen venlafaxine (EFFEXOR) 37.5 MG tablet Take 37.5 mg by mouth 2 (two) times daily.  . [DISCONTINUED] clopidogrel (PLAVIX) 75 MG tablet Take 4 tablets (300 mg total) by mouth daily for 1 day, THEN 1 tablet (75 mg total) daily. (Patient taking differently: Take 1 tablet (75 mg) by mouth  daily.)     Allergies:   Vasotec, Atorvastatin, Codeine, Cymbalta [duloxetine hcl], Lansoprazole, Morphine and related, Sulfate, Amitriptyline hcl, Escitalopram oxalate, Iron, Sertraline hcl, Tramadol, Adhesive [tape], Allevyn adhesive [wound dressings], and Scopolamine   Social History   Socioeconomic History  . Marital status: Married    Spouse name: Charlotte Crumb  . Number of children: 2  . Years of education: 43  . Highest education level: Not on file  Occupational History    Comment: retired Pharmacist, hospital, IT sales professional  Tobacco Use  . Smoking status: Never Smoker  . Smokeless tobacco: Never Used  Vaping Use  . Vaping Use: Never used  Substance and Sexual Activity  . Alcohol use: No  . Drug use: No  . Sexual activity: Never    Birth control/protection: Surgical  Other Topics Concern  . Not on file  Social History Narrative   Lives with husband   Caffeine use- none   Social Determinants of Health   Financial Resource Strain: Not on file  Food Insecurity: Not on file  Transportation Needs: Not  on file  Physical Activity: Not on file  Stress: Not on file  Social Connections: Not on file     Family History: The patient's family history includes Heart attack in her father; Lung cancer in her mother. There is no history of Colon cancer, Stroke, or Migraines.  ROS:   Please see the history of present illness.    All other systems reviewed and are negative.  EKGs/Labs/Other  Studies Reviewed:    The following studies were reviewed today:   EKG:  The ekg ordered today demonstrates atrial fibrillation  Recent Labs: 07/22/2020: ALT 21; B Natriuretic Peptide 347.4; TSH 0.549 07/27/2020: Magnesium 1.8; Platelets 366 09/09/2020: BUN 22; Creatinine, Ser 1.00; Hemoglobin 14.6; Potassium 4.2; Sodium 138  Recent Lipid Panel No results found for: CHOL, TRIG, HDL, CHOLHDL, VLDL, LDLCALC, LDLDIRECT  Physical Exam:    VS:  BP (!) 142/60   Pulse 84   Ht 5\' 4"  (1.626 m)   Wt 148 lb (67.1 kg)   SpO2 96%   BMI 25.40 kg/m     Wt Readings from Last 3 Encounters:  10/12/20 148 lb (67.1 kg)  09/09/20 149 lb (67.6 kg)  08/31/20 149 lb (67.6 kg)     GEN:  Well nourished, well developed in no acute distress HEENT: Normal NECK: No JVD; No carotid bruits LYMPHATICS: No lymphadenopathy CARDIAC: Irregularly irregular, no murmurs, rubs, gallops RESPIRATORY:  Clear to auscultation without rales, wheezing or rhonchi  ABDOMEN: Soft, non-tender, non-distended MUSCULOSKELETAL:  No edema; No deformity  SKIN: Warm and dry NEUROLOGIC:  Alert and oriented x 3 PSYCHIATRIC:  Normal affect   ASSESSMENT:    1. Persistent atrial fibrillation ([employer])   2. Essential hypertension   3. Transient alteration of awareness    PLAN:    In order of problems listed above:  1. Persistent atrial fibrillation Patient remains in atrial fibrillation.  She is symptomatic while in atrial fibrillation with exercise intolerance, fatigue, palpitations.  I am concerned that her episode while playing cards last week  could have been a postconversion pause that she is in and out of atrial fibrillation.  I would like to order a 7-day ZIO monitor to further assess her burden of atrial fibrillation and whether she is in and out of this rhythm.  She should continue taking her clopidogrel.  I would like to go ahead and start loading her with amiodarone in an effort to maintain normal rhythm.  She will take 400 mg by mouth twice daily for 7 days followed by 400 mg by mouth once daily for 7 days followed by 200 mg by mouth daily thereafter.  2.  Altered mental status This episode is concerning although the details are slightly unclear.  Given the global nature of the event with no localizing symptoms, I do not think this represents an ischemic event.  Unclear if this could have been a side effect of medication, lack of sleep, or related to an arrhythmia with postconversion pause causing the initial abrupt change in mental status.  From the husband's description while she was altered, she was still able to take a shower and then go lay down.  Her symptoms completely resolved with 12 hours of sleep.  Also wonder whether or not she could have had COVID-19 given her husband's recent infection.    I would like to see her back in clinic in 4 weeks.   Medication Adjustments/Labs and Tests Ordered: Current medicines are reviewed at length with the patient today.  Concerns regarding medicines are outlined above.  Orders Placed This Encounter  Procedures  . LONG TERM MONITOR (3-14 DAYS)  . EKG 12-Lead   Meds ordered this encounter  Medications  . amiodarone (PACERONE) 200 MG tablet    Sig: Take 2 tablets (400 mg total) by mouth 2 (two) times daily for 7 days, THEN 2 tablets (400 mg total) daily for 7 days, THEN 1 tablet (200 mg total) daily.  Dispense:  90 tablet    Refill:  3     Signed, Lars Mage, MD, [facility]  10/12/2020 12:48 PM    Electrophysiology [facility]

## 2020-10-14 DIAGNOSIS — I480 Paroxysmal atrial fibrillation: Secondary | ICD-10-CM | POA: Diagnosis not present

## 2020-10-15 DIAGNOSIS — I5020 Unspecified systolic (congestive) heart failure: Secondary | ICD-10-CM | POA: Diagnosis not present

## 2020-10-26 DIAGNOSIS — I4819 Other persistent atrial fibrillation: Secondary | ICD-10-CM | POA: Diagnosis not present

## 2020-10-26 DIAGNOSIS — R5381 Other malaise: Secondary | ICD-10-CM | POA: Diagnosis not present

## 2020-10-26 DIAGNOSIS — R296 Repeated falls: Secondary | ICD-10-CM | POA: Diagnosis not present

## 2020-10-26 DIAGNOSIS — E782 Mixed hyperlipidemia: Secondary | ICD-10-CM | POA: Diagnosis not present

## 2020-10-26 DIAGNOSIS — I509 Heart failure, unspecified: Secondary | ICD-10-CM | POA: Diagnosis not present

## 2020-10-26 DIAGNOSIS — F339 Major depressive disorder, recurrent, unspecified: Secondary | ICD-10-CM | POA: Diagnosis not present

## 2020-10-26 DIAGNOSIS — E1142 Type 2 diabetes mellitus with diabetic polyneuropathy: Secondary | ICD-10-CM | POA: Diagnosis not present

## 2020-10-26 DIAGNOSIS — K2100 Gastro-esophageal reflux disease with esophagitis, without bleeding: Secondary | ICD-10-CM | POA: Diagnosis not present

## 2020-10-26 DIAGNOSIS — I10 Essential (primary) hypertension: Secondary | ICD-10-CM | POA: Diagnosis not present

## 2020-10-28 DIAGNOSIS — I480 Paroxysmal atrial fibrillation: Secondary | ICD-10-CM | POA: Diagnosis not present

## 2020-10-28 DIAGNOSIS — F339 Major depressive disorder, recurrent, unspecified: Secondary | ICD-10-CM | POA: Diagnosis not present

## 2020-10-28 DIAGNOSIS — I130 Hypertensive heart and chronic kidney disease with heart failure and stage 1 through stage 4 chronic kidney disease, or unspecified chronic kidney disease: Secondary | ICD-10-CM | POA: Diagnosis not present

## 2020-10-28 DIAGNOSIS — N183 Chronic kidney disease, stage 3 unspecified: Secondary | ICD-10-CM | POA: Diagnosis not present

## 2020-10-28 DIAGNOSIS — E1142 Type 2 diabetes mellitus with diabetic polyneuropathy: Secondary | ICD-10-CM | POA: Diagnosis not present

## 2020-10-28 DIAGNOSIS — E1122 Type 2 diabetes mellitus with diabetic chronic kidney disease: Secondary | ICD-10-CM | POA: Diagnosis not present

## 2020-10-28 DIAGNOSIS — I509 Heart failure, unspecified: Secondary | ICD-10-CM | POA: Diagnosis not present

## 2020-10-28 DIAGNOSIS — I4819 Other persistent atrial fibrillation: Secondary | ICD-10-CM | POA: Diagnosis not present

## 2020-10-28 DIAGNOSIS — E113299 Type 2 diabetes mellitus with mild nonproliferative diabetic retinopathy without macular edema, unspecified eye: Secondary | ICD-10-CM | POA: Diagnosis not present

## 2020-10-28 DIAGNOSIS — D631 Anemia in chronic kidney disease: Secondary | ICD-10-CM | POA: Diagnosis not present

## 2020-11-01 DIAGNOSIS — E1122 Type 2 diabetes mellitus with diabetic chronic kidney disease: Secondary | ICD-10-CM | POA: Diagnosis not present

## 2020-11-01 DIAGNOSIS — N183 Chronic kidney disease, stage 3 unspecified: Secondary | ICD-10-CM | POA: Diagnosis not present

## 2020-11-01 DIAGNOSIS — F339 Major depressive disorder, recurrent, unspecified: Secondary | ICD-10-CM | POA: Diagnosis not present

## 2020-11-01 DIAGNOSIS — E113299 Type 2 diabetes mellitus with mild nonproliferative diabetic retinopathy without macular edema, unspecified eye: Secondary | ICD-10-CM | POA: Diagnosis not present

## 2020-11-01 DIAGNOSIS — D631 Anemia in chronic kidney disease: Secondary | ICD-10-CM | POA: Diagnosis not present

## 2020-11-01 DIAGNOSIS — I509 Heart failure, unspecified: Secondary | ICD-10-CM | POA: Diagnosis not present

## 2020-11-01 DIAGNOSIS — E1142 Type 2 diabetes mellitus with diabetic polyneuropathy: Secondary | ICD-10-CM | POA: Diagnosis not present

## 2020-11-01 DIAGNOSIS — I4819 Other persistent atrial fibrillation: Secondary | ICD-10-CM | POA: Diagnosis not present

## 2020-11-01 DIAGNOSIS — I130 Hypertensive heart and chronic kidney disease with heart failure and stage 1 through stage 4 chronic kidney disease, or unspecified chronic kidney disease: Secondary | ICD-10-CM | POA: Diagnosis not present

## 2020-11-02 ENCOUNTER — Ambulatory Visit: Payer: Medicare PPO | Admitting: Cardiology

## 2020-11-03 DIAGNOSIS — E1142 Type 2 diabetes mellitus with diabetic polyneuropathy: Secondary | ICD-10-CM | POA: Diagnosis not present

## 2020-11-03 DIAGNOSIS — E1122 Type 2 diabetes mellitus with diabetic chronic kidney disease: Secondary | ICD-10-CM | POA: Diagnosis not present

## 2020-11-03 DIAGNOSIS — D631 Anemia in chronic kidney disease: Secondary | ICD-10-CM | POA: Diagnosis not present

## 2020-11-03 DIAGNOSIS — E113299 Type 2 diabetes mellitus with mild nonproliferative diabetic retinopathy without macular edema, unspecified eye: Secondary | ICD-10-CM | POA: Diagnosis not present

## 2020-11-03 DIAGNOSIS — F339 Major depressive disorder, recurrent, unspecified: Secondary | ICD-10-CM | POA: Diagnosis not present

## 2020-11-03 DIAGNOSIS — I130 Hypertensive heart and chronic kidney disease with heart failure and stage 1 through stage 4 chronic kidney disease, or unspecified chronic kidney disease: Secondary | ICD-10-CM | POA: Diagnosis not present

## 2020-11-03 DIAGNOSIS — N183 Chronic kidney disease, stage 3 unspecified: Secondary | ICD-10-CM | POA: Diagnosis not present

## 2020-11-03 DIAGNOSIS — I509 Heart failure, unspecified: Secondary | ICD-10-CM | POA: Diagnosis not present

## 2020-11-03 DIAGNOSIS — I4819 Other persistent atrial fibrillation: Secondary | ICD-10-CM | POA: Diagnosis not present

## 2020-11-04 DIAGNOSIS — I130 Hypertensive heart and chronic kidney disease with heart failure and stage 1 through stage 4 chronic kidney disease, or unspecified chronic kidney disease: Secondary | ICD-10-CM | POA: Diagnosis not present

## 2020-11-04 DIAGNOSIS — I4819 Other persistent atrial fibrillation: Secondary | ICD-10-CM | POA: Diagnosis not present

## 2020-11-04 DIAGNOSIS — I509 Heart failure, unspecified: Secondary | ICD-10-CM | POA: Diagnosis not present

## 2020-11-04 DIAGNOSIS — E1142 Type 2 diabetes mellitus with diabetic polyneuropathy: Secondary | ICD-10-CM | POA: Diagnosis not present

## 2020-11-04 DIAGNOSIS — E1122 Type 2 diabetes mellitus with diabetic chronic kidney disease: Secondary | ICD-10-CM | POA: Diagnosis not present

## 2020-11-04 DIAGNOSIS — E113299 Type 2 diabetes mellitus with mild nonproliferative diabetic retinopathy without macular edema, unspecified eye: Secondary | ICD-10-CM | POA: Diagnosis not present

## 2020-11-04 DIAGNOSIS — N183 Chronic kidney disease, stage 3 unspecified: Secondary | ICD-10-CM | POA: Diagnosis not present

## 2020-11-04 DIAGNOSIS — D631 Anemia in chronic kidney disease: Secondary | ICD-10-CM | POA: Diagnosis not present

## 2020-11-04 DIAGNOSIS — F339 Major depressive disorder, recurrent, unspecified: Secondary | ICD-10-CM | POA: Diagnosis not present

## 2020-11-08 DIAGNOSIS — E113299 Type 2 diabetes mellitus with mild nonproliferative diabetic retinopathy without macular edema, unspecified eye: Secondary | ICD-10-CM | POA: Diagnosis not present

## 2020-11-08 DIAGNOSIS — I509 Heart failure, unspecified: Secondary | ICD-10-CM | POA: Diagnosis not present

## 2020-11-08 DIAGNOSIS — N183 Chronic kidney disease, stage 3 unspecified: Secondary | ICD-10-CM | POA: Diagnosis not present

## 2020-11-08 DIAGNOSIS — I4819 Other persistent atrial fibrillation: Secondary | ICD-10-CM | POA: Diagnosis not present

## 2020-11-08 DIAGNOSIS — F339 Major depressive disorder, recurrent, unspecified: Secondary | ICD-10-CM | POA: Diagnosis not present

## 2020-11-08 DIAGNOSIS — E1122 Type 2 diabetes mellitus with diabetic chronic kidney disease: Secondary | ICD-10-CM | POA: Diagnosis not present

## 2020-11-08 DIAGNOSIS — D631 Anemia in chronic kidney disease: Secondary | ICD-10-CM | POA: Diagnosis not present

## 2020-11-08 DIAGNOSIS — E1142 Type 2 diabetes mellitus with diabetic polyneuropathy: Secondary | ICD-10-CM | POA: Diagnosis not present

## 2020-11-08 DIAGNOSIS — I130 Hypertensive heart and chronic kidney disease with heart failure and stage 1 through stage 4 chronic kidney disease, or unspecified chronic kidney disease: Secondary | ICD-10-CM | POA: Diagnosis not present

## 2020-11-09 ENCOUNTER — Encounter: Payer: Self-pay | Admitting: Cardiology

## 2020-11-09 ENCOUNTER — Other Ambulatory Visit: Payer: Self-pay

## 2020-11-09 ENCOUNTER — Ambulatory Visit (INDEPENDENT_AMBULATORY_CARE_PROVIDER_SITE_OTHER): Payer: Medicare PPO | Admitting: Cardiology

## 2020-11-09 VITALS — BP 140/64 | HR 62 | Ht 64.0 in | Wt 145.0 lb

## 2020-11-09 DIAGNOSIS — R0602 Shortness of breath: Secondary | ICD-10-CM

## 2020-11-09 DIAGNOSIS — I5032 Chronic diastolic (congestive) heart failure: Secondary | ICD-10-CM

## 2020-11-09 DIAGNOSIS — I4819 Other persistent atrial fibrillation: Secondary | ICD-10-CM | POA: Diagnosis not present

## 2020-11-09 DIAGNOSIS — Z01812 Encounter for preprocedural laboratory examination: Secondary | ICD-10-CM

## 2020-11-09 DIAGNOSIS — I4821 Permanent atrial fibrillation: Secondary | ICD-10-CM | POA: Diagnosis not present

## 2020-11-09 DIAGNOSIS — Z95818 Presence of other cardiac implants and grafts: Secondary | ICD-10-CM

## 2020-11-09 LAB — BASIC METABOLIC PANEL
BUN/Creatinine Ratio: 18 (ref 12–28)
BUN: 22 mg/dL (ref 8–27)
CO2: 27 mmol/L (ref 20–29)
Calcium: 9.5 mg/dL (ref 8.7–10.3)
Chloride: 99 mmol/L (ref 96–106)
Creatinine, Ser: 1.2 mg/dL — ABNORMAL HIGH (ref 0.57–1.00)
Glucose: 114 mg/dL — ABNORMAL HIGH (ref 65–99)
Potassium: 4.3 mmol/L (ref 3.5–5.2)
Sodium: 144 mmol/L (ref 134–144)
eGFR: 46 mL/min/{1.73_m2} — ABNORMAL LOW (ref >59)

## 2020-11-09 LAB — CBC
Hematocrit: 46.1 % (ref 34.0–46.6)
Hemoglobin: 14.4 g/dL (ref 11.1–15.9)
MCH: 26.7 pg (ref 26.6–33.0)
MCHC: 31.2 g/dL — ABNORMAL LOW (ref 31.5–35.7)
MCV: 85 fL (ref 79–97)
Platelets: 231 10*3/uL (ref 150–450)
RBC: 5.4 x10E6/uL — ABNORMAL HIGH (ref 3.77–5.28)
RDW: 14.5 % (ref 11.7–15.4)
WBC: 8.1 10*3/uL (ref 3.4–10.8)

## 2020-11-09 NOTE — Patient Instructions (Addendum)
Medication Instructions:  Your physician recommends that you continue on your current medications as directed. Please refer to the Current Medication list given to you today.  *If you need a refill on your cardiac medications before your next appointment, please call your pharmacy*   Lab Work: Pre procedure labs today: BMET & CBC If you have labs (blood work) drawn today and your tests are completely normal, you will receive your results only by: Marland Kitchen MyChart Message (if you have MyChart) OR . A paper copy in the mail If you have any lab test that is abnormal or we need to change your treatment, we will call you to review the results.   Testing/Procedures: Your physician has recommended that you have a TEE/Cardioversion (DCCV). Electrical Cardioversion uses a jolt of electricity to your heart either through paddles or wired patches attached to your chest. This is a controlled, usually prescheduled, procedure. Defibrillation is done under light anesthesia in the hospital, and you usually go home the day of the procedure. This is done to get your heart back into a normal rhythm. You are not awake for the procedure. Please see the instructions below located under "other instructions".     Follow-Up: At [facility], you and your health needs are our priority.  As part of our continuing mission to provide you with exceptional heart care, we have created designated Provider Care Teams.  These Care Teams include your primary Cardiologist (physician) and Advanced Practice Providers (APPs -  Physician Assistants and Nurse Practitioners) who all work together to provide you with the care you need, when you need it.  We recommend signing up for the patient portal called "MyChart".  Sign up information is provided on this After Visit Summary.  MyChart is used to connect with patients for Virtual Visits (Telemedicine).  Patients are able to view lab/test results, encounter notes, upcoming appointments, etc.   Non-urgent messages can be sent to your provider as well.   To learn more about what you can do with MyChart, go to [email or web address].    Your next appointment:   3 month(s)  The format for your next appointment:   In Person  Provider:   Lars Mage, MD    Thank you for choosing [facility]!!   [phone]   Other Instructions  COVID TEST-- On 11/16/2020 @ 1:00 pm - You will go to [facility] for your Covid testing.   This is a drive thru test site, stay in your car and the nurse team will come to your car to test you.  After you are tested please go home and self quarantine until the day of your procedure.     You are scheduled for a TEE/Cardioversion on 11/18/2020 with Dr. Gardiner Rhyme.  Please arrive at the [facility] (Main Entrance [facility] at [facility]: [address] [place], [place] [address] at 8:00am.   DIET: Nothing to eat or drink after midnight except a sip of water with medications (see medication instructions below)  Medication Instructions: Hold all medications the morning of this procedure  Continue your anticoagulant: Eliquis You will need to continue your anticoagulant after your procedure until you are told by your provider that it is safe to stop   Labs: Today 3/15  You must have a responsible person to drive you home and stay in the waiting area during your procedure. Failure to do so could result in cancellation.  Bring your insurance cards.  *Special Note: Every effort  is made to have your procedure done on time. Occasionally there are emergencies that occur at the hospital that may cause delays. Please be patient if a delay does occur.      Electrical Cardioversion Electrical cardioversion is the delivery of a jolt of electricity to restore a normal rhythm to the heart. A rhythm that is too fast or is not regular keeps the heart from pumping well. In this procedure, sticky patches or metal paddles are  placed on the chest to deliver electricity to the heart from a device. This procedure may be done in an emergency if:  There is low or no blood pressure as a result of the heart rhythm.  Normal rhythm must be restored as fast as possible to protect the brain and heart from further damage.  It may save a life. This may also be a scheduled procedure for irregular or fast heart rhythms that are not immediately life-threatening. Tell a health care provider about:  Any allergies you have.  All medicines you are taking, including vitamins, herbs, eye drops, creams, and over-the-counter medicines.  Any problems you or family members have had with anesthetic medicines.  Any blood disorders you have.  Any surgeries you have had.  Any medical conditions you have.  Whether you are pregnant or may be pregnant. What are the risks? Generally, this is a safe procedure. However, problems may occur, including:  Allergic reactions to medicines.  A blood clot that breaks free and travels to other parts of your body.  The possible return of an abnormal heart rhythm within hours or days after the procedure.  Your heart stopping (cardiac arrest). This is rare. What happens before the procedure? Medicines  Your health care provider may have you start taking: ? Blood-thinning medicines (anticoagulants) so your blood does not clot as easily. ? Medicines to help stabilize your heart rate and rhythm.  Ask your health care provider about: ? Changing or stopping your regular medicines. This is especially important if you are taking diabetes medicines or blood thinners. ? Taking medicines such as aspirin and ibuprofen. These medicines can thin your blood. Do not take these medicines unless your health care provider tells you to take them. ? Taking over-the-counter medicines, vitamins, herbs, and supplements. General instructions  Follow instructions from your health care provider about eating or  drinking restrictions.  Plan to have someone take you home from the hospital or clinic.  If you will be going home right after the procedure, plan to have someone with you for 24 hours.  Ask your health care provider what steps will be taken to help prevent infection. These may include washing your skin with a germ-killing soap. What happens during the procedure?  An IV will be inserted into one of your veins.  Sticky patches (electrodes) or metal paddles may be placed on your chest.  You will be given a medicine to help you relax (sedative).  An electrical shock will be delivered. The procedure may vary among health care providers and hospitals.   What can I expect after the procedure?  Your blood pressure, heart rate, breathing rate, and blood oxygen level will be monitored until you leave the hospital or clinic.  Your heart rhythm will be watched to make sure it does not change.  You may have some redness on the skin where the shocks were given. Follow these instructions at home:  Do not drive for 24 hours if you were given a sedative during your procedure.  Take over-the-counter and prescription medicines only as told by your health care provider.  Ask your health care provider how to check your pulse. Check it often.  Rest for 48 hours after the procedure or as told by your health care provider.  Avoid or limit your caffeine use as told by your health care provider.  Keep all follow-up visits as told by your health care provider. This is important. Contact a health care provider if:  You feel like your heart is beating too quickly or your pulse is not regular.  You have a serious muscle cramp that does not go away. Get help right away if:  You have discomfort in your chest.  You are dizzy or you feel faint.  You have trouble breathing or you are short of breath.  Your speech is slurred.  You have trouble moving an arm or leg on one side of your body.  Your  fingers or toes turn cold or blue. Summary  Electrical cardioversion is the delivery of a jolt of electricity to restore a normal rhythm to the heart.  This procedure may be done right away in an emergency or may be a scheduled procedure if the condition is not an emergency.  Generally, this is a safe procedure.  After the procedure, check your pulse often as told by your health care provider. This information is not intended to replace advice given to you by your health care provider. Make sure you discuss any questions you have with your health care provider. Document Revised: 03/17/2019 Document Reviewed: 03/17/2019 Elsevier Patient Education  2021 Elsevier In

## 2020-11-09 NOTE — Progress Notes (Signed)
Electrophysiology Office Follow up Visit Note:    Date:  11/09/2020   ID:  [name], DOB [birth year], MRN [id number]  PCP:  Aretta Nip, MD  [place] Cardiologist:  Candee Furbish, MD  [facility] Electrophysiologist:  Vickie Epley, MD    Interval History:    [name] is a 80 y.o. female who presents for a follow up visit.  Last saw the patient October 12, 2020 for her persistent atrial fibrillation.  She had failed a cardioversion with early recurrence of atrial fibrillation.  At that visit I started amiodarone and she is down to taking 200 mg by mouth once daily.  She has not had a recurrent episode of altered mental status that she described at her last appointment.  She is overall in better spirits during today's visit.  She is with her husband again in clinic.  She tells me she still has some shortness of breath with exertion.     Past Medical History:  Diagnosis Date  . Acute GI bleeding 05/13/2015  . Anxiety   . Arthritis   . Asthma   . Atrial fibrillation ([place]) 10/29/2017  . Bakers cyst, left   . Chronic kidney disease    stage 3  . Constipation   . Diabetes mellitus    type 2  . Dysrhythmia   . GERD (gastroesophageal reflux disease)   . Heart murmur   . History of cellulitis   . History of dizziness   . History of hiatal hernia    tiny 06/15/2016  . Hypertension   . Iron deficiency anemia   . Peripheral vascular disease ([place])   . PONV (postoperative nausea and vomiting)   . Pulmonary nodule     Past Surgical History:  Procedure Laterality Date  . APPENDECTOMY  1952  . BIOPSY  10/16/2018   Procedure: BIOPSY;  Surgeon: Clarene Essex, MD;  Location: [facility];  Service: Endoscopy;;  . BIOPSY  11/26/2019   Procedure: BIOPSY;  Surgeon: Clarene Essex, MD;  Location: [facility];  Service: Endoscopy;;  . BIOPSY  07/14/2020   Procedure: BIOPSY;  Surgeon: Ronald Lobo, MD;  Location: [facility];  Service: Endoscopy;;  .  CARDIOVERSION N/A 09/09/2020   Procedure: CARDIOVERSION;  Surgeon: Josue Hector, MD;  Location[facility] END[facility];  Service: Cardiovascular;  Laterality: N/A;  . COLONOSCOPY W/ POLYPECTOMY  05/15/2013  . ESOPHAGOGASTRODUODENOSCOPY (EGD) WITH PROPOFOL N/A 06/15/2016   Procedure: ESOPHAGOGASTRODUODENOSCOPY (EGD) WITH PROPOFOL;  Surgeon: Clarene Essex, MD;  Location: [facility];  Service: Endoscopy;  Laterality: N/A;  . ESOPHAGOGASTRODUODENOSCOPY (EGD) WITH PROPOFOL N/A 10/16/2018   Procedure: ESOPHAGOGASTRODUODENOSCOPY (EGD) WITH PROPOFOL;  Surgeon: Clarene Essex, MD;  Location: [facility];  Service: Endoscopy;  Laterality: N/A;  . ESOPHAGOGASTRODUODENOSCOPY (EGD) WITH PROPOFOL N/A 11/26/2019   Procedure: ESOPHAGOGASTRODUODENOSCOPY (EGD) WITH PROPOFOL;  Surgeon: Clarene Essex, MD;  Location: [facility];  Service: Endoscopy;  Laterality: N/A;  . ESOPHAGOGASTRODUODENOSCOPY (EGD) WITH PROPOFOL N/A 07/14/2020   Procedure: ESOPHAGOGASTRODUODENOSCOPY (EGD) WITH PROPOFOL;  Surgeon: Ronald Lobo, MD;  Location: [facility];  Service: Endoscopy;  Laterality: N/A;  . EYE SURGERY Bilateral    cataracts removed  . GI RADIOFREQUENCY ABLATION  10/16/2018   Procedure: GI RADIOFREQUENCY ABLATION;  Surgeon: Clarene Essex, MD;  Location: [facility];  Service: Endoscopy;;  . GI RADIOFREQUENCY ABLATION N/A 11/26/2019   Procedure: GI RADIOFREQUENCY ABLATION;  Surgeon: Clarene Essex, MD;  Location: [facility];  Service: Endoscopy;  Laterality: N/A;  . HAND SURGERY Left 2010   operated  on index and ring finger  . HOT HEMOSTASIS N/A 07/14/2020   Procedure: HOT HEMOSTASIS (ARGON PLASMA COAGULATION/BICAP);  Surgeon: Ronald Lobo, MD;  Location: [facility];  Service: Endoscopy;  Laterality: N/A;  . INCONTINENCE SURGERY    . JOINT REPLACEMENT Right    knee  . KNEE ARTHROSCOPY  2003 2005   right knee  . KYPHOPLASTY N/A 01/29/2020   Procedure: T8 KYPHOPLASTY;  Surgeon: Melina Schools, MD;  Location: [place];  Service:  Orthopedics;  Laterality: N/A;  60 mins Local with [facility]  . LEFT ATRIAL APPENDAGE OCCLUSION N/A 07/01/2020   Procedure: LEFT ATRIAL APPENDAGE OCCLUSION;  Surgeon: Vickie Epley, MD;  Location: [place] [facility];  Service: Cardiovascular;  Laterality: N/A;  . mass fallopian tube    . SPINAL CORD STIMULATOR BATTERY EXCHANGE N/A 09/09/2015   Procedure: SPINAL CORD STIMULATOR BATTERY EXCHANGE;  Surgeon: Melina Schools, MD;  Location: [place];  Service: Orthopedics;  Laterality: N/A;  . TEE WITHOUT CARDIOVERSION N/A 08/18/2020   Procedure: TRANSESOPHAGEAL ECHOCARDIOGRAM (TEE);  Surgeon: Josue Hector, MD;  Location: [facility] ENDOSCOPY;  Service: Cardiovascular;  Laterality: N/A;  . TEE WITHOUT CARDIOVERSION N/A 09/09/2020   Procedure: TRANSESOPHAGEAL ECHOCARDIOGRAM (TEE);  Surgeon: Josue Hector, MD;  Location: [facility] ENDOSCOPY;  Service: Cardiovascular;  Laterality: N/A;  . TOTAL KNEE ARTHROPLASTY Left 11/05/2017   Procedure: LEFT TOTAL KNEE ARTHROPLASTY;  Surgeon: Paralee Cancel, MD;  Location: [facility];  Service: Orthopedics;  Laterality: Left;  Adductor Block  . VAGINAL HYSTERECTOMY  1977   1 ovary removed    Current Medications: Current Meds  Medication Sig  . acetaminophen (TYLENOL) 500 MG tablet Take 1,000 mg by mouth every 8 (eight) hours as needed for mild pain, moderate pain or headache.   . albuterol (PROAIR HFA) 108 (90 BASE) MCG/ACT inhaler Inhale 2 puffs into the lungs every 4 (four) hours as needed for wheezing or shortness of breath.  . ALPRAZolam (XANAX) 0.25 MG tablet Take 0.25 mg by mouth at bedtime.   Marland Kitchen amiodarone (PACERONE) 200 MG tablet Take 200 mg by mouth daily.  . clopidogrel (PLAVIX) 75 MG tablet Take 75 mg by mouth daily.  . Continuous Blood Gluc Sensor (FREESTYLE LIBRE 14 DAY SENSOR) MISC Inject 1 patch into the skin every 14 (fourteen) days.  . diphenhydrAMINE (BENADRYL) 25 MG tablet Take 25 mg by mouth daily as needed for allergies.  Marland Kitchen docusate sodium (COLACE) 100 MG  capsule Take 100 mg by mouth in the morning and at bedtime.  . DROPLET PEN NEEDLES 32G X 4 MM MISC   . esomeprazole (NEXIUM) 40 MG capsule Take 40 mg by mouth in the morning and at bedtime.   . fluticasone (FLONASE) 50 MCG/ACT nasal spray Place 1 spray into both nostrils daily as needed for allergies.   . furosemide (LASIX) 40 MG tablet Take 1 tablet (40 mg total) by mouth daily.  Marland Kitchen gabapentin (NEURONTIN) 300 MG capsule Take 600 mg by mouth in the morning, at noon, and at bedtime.   . insulin glargine (LANTUS) 100 UNIT/ML Solostar Pen Inject 30 Units into the skin at bedtime.   . Iron-FA-B Cmp-C-Biot-Probiotic (FUSION PLUS) CAPS Take 1 capsule by mouth every evening.   . magnesium oxide (MAG-OX) 400 (241.3 Mg) MG tablet Take 0.5 tablets (200 mg total) by mouth 2 (two) times daily.  . metoprolol tartrate (LOPRESSOR) 100 MG tablet Take 1 tablet (100 mg total) by mouth 2 (two) times daily.  . Multiple Vitamins-Minerals (PRESERVISION AREDS 2 PO)  Take 1 capsule by mouth 2 (two) times daily.   Marland Kitchen NOVOLOG FLEXPEN 100 UNIT/ML FlexPen Inject 8-12 Units into the skin See admin instructions. Inject 8-12 units into the skin, PER SLIDING SCALE, in the morning, noon, and bedtime  . OXYGEN Inhale 2.5 L/min into the lungs as needed (for shortness of breath).  . Pitavastatin Calcium (LIVALO) 1 MG TABS Take 1 mg by mouth at bedtime.  . potassium chloride SA (KLOR-CON) 20 MEQ tablet Take 2 tablets (40 mEq total) by mouth daily. (Patient taking differently: Take 20 mEq by mouth 2 (two) times daily.)  . sodium chloride (OCEAN) 0.65 % SOLN nasal spray Place 1 spray into both nostrils as needed for congestion.  Marland Kitchen venlafaxine (EFFEXOR) 37.5 MG tablet Take 37.5 mg by mouth 2 (two) times daily.  . [DISCONTINUED] amiodarone (PACERONE) 200 MG tablet Take 2 tablets (400 mg total) by mouth 2 (two) times daily for 7 days, THEN 2 tablets (400 mg total) daily for 7 days, THEN 1 tablet (200 mg total) daily. (Patient taking  differently: 1 tablet (200 mg total) daily.)     Allergies:   Vasotec, Atorvastatin, Codeine, Cymbalta [duloxetine hcl], Lansoprazole, Morphine and related, Sulfate, Amitriptyline hcl, Escitalopram oxalate, Iron, Sertraline hcl, Tramadol, Adhesive [tape], Allevyn adhesive [wound dressings], and Scopolamine   Social History   Socioeconomic History  . Marital status: Married    Spouse name: Charlotte Crumb  . Number of children: 2  . Years of education: 29  . Highest education level: Not on file  Occupational History    Comment: retired Pharmacist, hospital, IT sales professional  Tobacco Use  . Smoking status: Never Smoker  . Smokeless tobacco: Never Used  Vaping Use  . Vaping Use: Never used  Substance and Sexual Activity  . Alcohol use: No  . Drug use: No  . Sexual activity: Never    Birth control/protection: Surgical  Other Topics Concern  . Not on file  Social History Narrative   Lives with husband   Caffeine use- none   Social Determinants of Health   Financial Resource Strain: Not on file  Food Insecurity: Not on file  Transportation Needs: Not on file  Physical Activity: Not on file  Stress: Not on file  Social Connections: Not on file     Family History: The patient's family history includes Heart attack in her father; Lung cancer in her mother. There is no history of Colon cancer, Stroke, or Migraines.  ROS:   Please see the history of present illness.    All other systems reviewed and are negative.  EKGs/Labs/Other Studies Reviewed:    The following studies were reviewed today:  09/09/2020 TEE 1. Since Watchman FLX well sealed and no thrombus proceded with [facility] with  patient only on plavix. [place] x 1 with 150J Converted to NSR with no  cmplicaitons.  2. Left ventricular ejection fraction, by estimation, is 60 to 65%. The  left ventricle has normal function.  3. Right ventricular systolic function is normal. The right ventricular  size is normal.  4. 27 mm Watchman FLX device  in good position with no leak. No thrombus  and average compression in multiple views 28%. Left atrial size was  moderately dilated. No left atrial/left atrial appendage thrombus was  detected.  5. Right atrial size was moderately dilated.  6. The mitral valve is abnormal. Mild mitral valve regurgitation.  7. The aortic valve is tricuspid. Aortic valve regurgitation is mild.  Mild to moderate aortic valve sclerosis/calcification is present, without  any evidence of aortic stenosis.    EKG:  The ekg ordered today demonstrates atypical atrial flutter with a ventricular rate of 65 bpm.  Recent Labs: 07/22/2020: ALT 21; B Natriuretic Peptide 347.4; TSH 0.549 07/27/2020: Magnesium 1.8; Platelets 366 09/09/2020: BUN 22; Creatinine, Ser 1.00; Hemoglobin 14.6; Potassium 4.2; Sodium 138  Recent Lipid Panel No results found for: CHOL, TRIG, HDL, CHOLHDL, VLDL, LDLCALC, LDLDIRECT  Physical Exam:    VS:  BP 140/64   Pulse 62   Ht 5\' 4"  (1.626 m)   Wt 145 lb (65.8 kg)   SpO2 96%   BMI 24.89 kg/m     Wt Readings from Last 3 Encounters:  11/09/20 145 lb (65.8 kg)  10/12/20 148 lb (67.1 kg)  09/09/20 149 lb (67.6 kg)     GEN:  Well nourished, well developed in no acute distress HEENT: Normal NECK: No JVD; No carotid bruits LYMPHATICS: No lymphadenopathy CARDIAC: RRR, no murmurs, rubs, gallops RESPIRATORY:  Clear to auscultation without rales, wheezing or rhonchi  ABDOMEN: Soft, non-tender, non-distended MUSCULOSKELETAL:  No edema; No deformity  SKIN: Warm and dry NEUROLOGIC:  Alert and oriented x 3 PSYCHIATRIC:  Normal affect   ASSESSMENT:    1. Persistent atrial fibrillation ([place])   2. Pre-procedure lab exam   3. SOB (shortness of breath)   4. Presence of Watchman left atrial appendage closure device   5. Chronic heart failure with preserved ejection fraction (H[facility])    PLAN:    In order of problems listed above:  1. Persistent atrial fibrillation Amiodarone has  organized her atrial fibrillation into an atypical atrial flutter.  Plan to schedule a TEE guided cardioversion.  I will still do a TEE despite the watchman device being in place given the relatively recent implant.  We will plan to use Plavix around the time of cardioversion.  2.  Shortness of breath I suspect this is related to her persistent atrial fibrillation/flutter.  TEE guided cardioversion as above.  3.  Presence of watchman device Last TEE showed a well-positioned device without leak.  Plan to continue Plavix.  6 months after implant, can transition to aspirin monotherapy.  4.  Chronic heart failure with preserved ejection fraction Patient plans to transition to a weight-based dosing of Lasix.  She weighs herself daily and is very in tune to her volume status.  I suspect her diuretic requirement will decrease when she is in normal rhythm.  Continue metoprolol.  Follow-up 3 months.  Will need CMP, TSH, free T4 at that appointment.   Medication Adjustments/Labs and Tests Ordered: Current medicines are reviewed at length with the patient today.  Concerns regarding medicines are outlined above.  Orders Placed This Encounter  Procedures  . Basic metabolic panel  . CBC  . EKG 12-Lead   No orders of the defined types were placed in this encounter.    Signed, Lars Mage, MD, [facility]  11/09/2020 11:08 AM    Electrophysiology [place]

## 2020-11-09 NOTE — H&P (View-Only) (Signed)
Electrophysiology Office Follow up Visit Note:    Date:  11/09/2020   ID:  [name], DOB 1941/07/18, MRN [id number]  PCP:  Aretta Nip, MD  [place] Cardiologist:  Candee Furbish, MD  [facility] Electrophysiologist:  Vickie Epley, MD    Interval History:    [name] is a 80 y.o. female who presents for a follow up visit.  Last saw the patient October 12, 2020 for her persistent atrial fibrillation.  She had failed a cardioversion with early recurrence of atrial fibrillation.  At that visit I started amiodarone and she is down to taking 200 mg by mouth once daily.  She has not had a recurrent episode of altered mental status that she described at her last appointment.  She is overall in better spirits during today's visit.  She is with her husband again in clinic.  She tells me she still has some shortness of breath with exertion.     Past Medical History:  Diagnosis Date  . Acute GI bleeding 05/13/2015  . Anxiety   . Arthritis   . Asthma   . Atrial fibrillation ([place]) 10/29/2017  . Bakers cyst, left   . Chronic kidney disease    stage 3  . Constipation   . Diabetes mellitus    type 2  . Dysrhythmia   . GERD (gastroesophageal reflux disease)   . Heart murmur   . History of cellulitis   . History of dizziness   . History of hiatal hernia    tiny 06/15/2016  . Hypertension   . Iron deficiency anemia   . Peripheral vascular disease ([place])   . PONV (postoperative nausea and vomiting)   . Pulmonary nodule     Past Surgical History:  Procedure Laterality Date  . APPENDECTOMY  1952  . BIOPSY  10/16/2018   Procedure: BIOPSY;  Surgeon: Clarene Essex, MD;  Location: [facility];  Service: Endoscopy;;  . BIOPSY  11/26/2019   Procedure: BIOPSY;  Surgeon: Clarene Essex, MD;  Location: [facility];  Service: Endoscopy;;  . BIOPSY  07/14/2020   Procedure: BIOPSY;  Surgeon: Ronald Lobo, MD;  Location: [facility];  Service: Endoscopy;;  .  CARDIOVERSION N/A 09/09/2020   Procedure: CARDIOVERSION;  Surgeon: Josue Hector, MD;  Location[facility] END[facility]COPY;  Service: Cardiovascular;  Laterality: N/A;  . COLONOSCOPY W/ POLYPECTOMY  05/15/2013  . ESOPHAGOGASTRODUODENOSCOPY (EGD) WITH PROPOFOL N/A 06/15/2016   Procedure: ESOPHAGOGASTRODUODENOSCOPY (EGD) WITH PROPOFOL;  Surgeon: Clarene Essex, MD;  [facility];  Service: Endoscopy;  Laterality: N/A;  . ESOPHAGOGASTRODUODENOSCOPY (EGD) WITH PROPOFOL N/A 10/16/2018   Procedure: ESOPHAGOGASTRODUODENOSCOPY (EGD) WITH PROPOFOL;  Surgeon: Clarene Essex, MD;  [facility];  Service: Endoscopy;  Laterality: N/A;  . ESOPHAGOGASTRODUODENOSCOPY (EGD) WITH PROPOFOL N/A 11/26/2019   Procedure: ESOPHAGOGASTRODUODENOSCOPY (EGD) WITH PROPOFOL;  Surgeon: Clarene Essex, MD;  [facility];  Service: Endoscopy;  Laterality: N/A;  . ESOPHAGOGASTRODUODENOSCOPY (EGD) WITH PROPOFOL N/A 07/14/2020   Procedure: ESOPHAGOGASTRODUODENOSCOPY (EGD) WITH PROPOFOL;  Surgeon: Ronald Lobo, MD;  [facility];  Service: Endoscopy;  Laterality: N/A;  . EYE SURGERY Bilateral    cataracts removed  . GI RADIOFREQUENCY ABLATION  10/16/2018   Procedure: GI RADIOFREQUENCY ABLATION;  Surgeon: Clarene Essex, MD;  [facility];  Service: Endoscopy;;  . GI RADIOFREQUENCY ABLATION N/A 11/26/2019   Procedure: GI RADIOFREQUENCY ABLATION;  Surgeon: Clarene Essex, MD;  [facility];  Service: Endoscopy;  Laterality: N/A;  . HAND SURGERY Left 2010   operated  on index and ring finger  . HOT HEMOSTASIS N/A 07/14/2020   Procedure: HOT HEMOSTASIS (ARGON PLASMA COAGULATION/BICAP);  Surgeon: Ronald Lobo, MD;  Location: [facility];  Service: Endoscopy;  Laterality: N/A;  . INCONTINENCE SURGERY    . JOINT REPLACEMENT Right    knee  . KNEE ARTHROSCOPY  2003 2005   right knee  . KYPHOPLASTY N/A 01/29/2020   Procedure: T8 KYPHOPLASTY;  Surgeon: Melina Schools, MD;  Location: [place];  Service:  Orthopedics;  Laterality: N/A;  60 mins Local with [facility]  . LEFT ATRIAL APPENDAGE OCCLUSION N/A 07/01/2020   Procedure: LEFT ATRIAL APPENDAGE OCCLUSION;  Surgeon: Vickie Epley, MD;  Location: [facility];  Service: Cardiovascular;  Laterality: N/A;  . mass fallopian tube    . SPINAL CORD STIMULATOR BATTERY EXCHANGE N/A 09/09/2015   Procedure: SPINAL CORD STIMULATOR BATTERY EXCHANGE;  Surgeon: Melina Schools, MD;  Location: [place];  Service: Orthopedics;  Laterality: N/A;  . TEE WITHOUT CARDIOVERSION N/A 08/18/2020   Procedure: TRANSESOPHAGEAL ECHOCARDIOGRAM (TEE);  Surgeon: Josue Hector, MD;  Location: [facility] ENDOSCOPY;  Service: Cardiovascular;  Laterality: N/A;  . TEE WITHOUT CARDIOVERSION N/A 09/09/2020   Procedure: TRANSESOPHAGEAL ECHOCARDIOGRAM (TEE);  Surgeon: Josue Hector, MD;  Location: [facility] ENDOSCOPY;  Service: Cardiovascular;  Laterality: N/A;  . TOTAL KNEE ARTHROPLASTY Left 11/05/2017   Procedure: LEFT TOTAL KNEE ARTHROPLASTY;  Surgeon: Paralee Cancel, MD;  [facility]: [facility];  Service: Orthopedics;  Laterality: Left;  Adductor Block  . VAGINAL HYSTERECTOMY  1977   1 ovary removed    Current Medications: Current Meds  Medication Sig  . acetaminophen (TYLENOL) 500 MG tablet Take 1,000 mg by mouth every 8 (eight) hours as needed for mild pain, moderate pain or headache.   . albuterol (PROAIR HFA) 108 (90 BASE) MCG/ACT inhaler Inhale 2 puffs into the lungs every 4 (four) hours as needed for wheezing or shortness of breath.  . ALPRAZolam (XANAX) 0.25 MG tablet Take 0.25 mg by mouth at bedtime.   Marland Kitchen amiodarone (PACERONE) 200 MG tablet Take 200 mg by mouth daily.  . clopidogrel (PLAVIX) 75 MG tablet Take 75 mg by mouth daily.  . Continuous Blood Gluc Sensor (FREESTYLE LIBRE 14 DAY SENSOR) MISC Inject 1 patch into the skin every 14 (fourteen) days.  . diphenhydrAMINE (BENADRYL) 25 MG tablet Take 25 mg by mouth daily as needed for allergies.  Marland Kitchen docusate sodium (COLACE) 100 MG  capsule Take 100 mg by mouth in the morning and at bedtime.  . DROPLET PEN NEEDLES 32G X 4 MM MISC   . esomeprazole (NEXIUM) 40 MG capsule Take 40 mg by mouth in the morning and at bedtime.   . fluticasone (FLONASE) 50 MCG/ACT nasal spray Place 1 spray into both nostrils daily as needed for allergies.   . furosemide (LASIX) 40 MG tablet Take 1 tablet (40 mg total) by mouth daily.  Marland Kitchen gabapentin (NEURONTIN) 300 MG capsule Take 600 mg by mouth in the morning, at noon, and at bedtime.   . insulin glargine (LANTUS) 100 UNIT/ML Solostar Pen Inject 30 Units into the skin at bedtime.   . Iron-FA-B Cmp-C-Biot-Probiotic (FUSION PLUS) CAPS Take 1 capsule by mouth every evening.   . magnesium oxide (MAG-OX) 400 (241.3 Mg) MG tablet Take 0.5 tablets (200 mg total) by mouth 2 (two) times daily.  . metoprolol tartrate (LOPRESSOR) 100 MG tablet Take 1 tablet (100 mg total) by mouth 2 (two) times daily.  . Multiple Vitamins-Minerals (PRESERVISION AREDS 2 PO)  Take 1 capsule by mouth 2 (two) times daily.   Marland Kitchen NOVOLOG FLEXPEN 100 UNIT/ML FlexPen Inject 8-12 Units into the skin See admin instructions. Inject 8-12 units into the skin, PER SLIDING SCALE, in the morning, noon, and bedtime  . OXYGEN Inhale 2.5 L/min into the lungs as needed (for shortness of breath).  . Pitavastatin Calcium (LIVALO) 1 MG TABS Take 1 mg by mouth at bedtime.  . potassium chloride SA (KLOR-CON) 20 MEQ tablet Take 2 tablets (40 mEq total) by mouth daily. (Patient taking differently: Take 20 mEq by mouth 2 (two) times daily.)  . sodium chloride (OCEAN) 0.65 % SOLN nasal spray Place 1 spray into both nostrils as needed for congestion.  Marland Kitchen venlafaxine (EFFEXOR) 37.5 MG tablet Take 37.5 mg by mouth 2 (two) times daily.  . [DISCONTINUED] amiodarone (PACERONE) 200 MG tablet Take 2 tablets (400 mg total) by mouth 2 (two) times daily for 7 days, THEN 2 tablets (400 mg total) daily for 7 days, THEN 1 tablet (200 mg total) daily. (Patient taking  differently: 1 tablet (200 mg total) daily.)     Allergies:   Vasotec, Atorvastatin, Codeine, Cymbalta [duloxetine hcl], Lansoprazole, Morphine and related, Sulfate, Amitriptyline hcl, Escitalopram oxalate, Iron, Sertraline hcl, Tramadol, Adhesive [tape], Allevyn adhesive [wound dressings], and Scopolamine   Social History   Socioeconomic History  . Marital status: Married    Spouse name: Charlotte Crumb  . Number of children: 2  . Years of education: 68  . Highest education level: Not on file  Occupational History    Comment: retired Pharmacist, hospital, IT sales professional  Tobacco Use  . Smoking status: Never Smoker  . Smokeless tobacco: Never Used  Vaping Use  . Vaping Use: Never used  Substance and Sexual Activity  . Alcohol use: No  . Drug use: No  . Sexual activity: Never    Birth control/protection: Surgical  Other Topics Concern  . Not on file  Social History Narrative   Lives with husband   Caffeine use- none   Social Determinants of Health   Financial Resource Strain: Not on file  Food Insecurity: Not on file  Transportation Needs: Not on file  Physical Activity: Not on file  Stress: Not on file  Social Connections: Not on file     Family History: The patient's family history includes Heart attack in her father; Lung cancer in her mother. There is no history of Colon cancer, Stroke, or Migraines.  ROS:   Please see the history of present illness.    All other systems reviewed and are negative.  EKGs/Labs/Other Studies Reviewed:    The following studies were reviewed today:  09/09/2020 TEE 1. Since Watchman FLX well sealed and no thrombus proceded with [facility] with  patient only on plavix. [place] x 1 with 150J Converted to NSR with no  cmplicaitons.  2. Left ventricular ejection fraction, by estimation, is 60 to 65%. The  left ventricle has normal function.  3. Right ventricular systolic function is normal. The right ventricular  size is normal.  4. 27 mm Watchman FLX device  in good position with no leak. No thrombus  and average compression in multiple views 28%. Left atrial size was  moderately dilated. No left atrial/left atrial appendage thrombus was  detected.  5. Right atrial size was moderately dilated.  6. The mitral valve is abnormal. Mild mitral valve regurgitation.  7. The aortic valve is tricuspid. Aortic valve regurgitation is mild.  Mild to moderate aortic valve sclerosis/calcification is present, without  any evidence of aortic stenosis.    EKG:  The ekg ordered today demonstrates atypical atrial flutter with a ventricular rate of 65 bpm.  Recent Labs: 07/22/2020: ALT 21; B Natriuretic Peptide 347.4; TSH 0.549 07/27/2020: Magnesium 1.8; Platelets 366 09/09/2020: BUN 22; Creatinine, Ser 1.00; Hemoglobin 14.6; Potassium 4.2; Sodium 138  Recent Lipid Panel No results found for: CHOL, TRIG, HDL, CHOLHDL, VLDL, LDLCALC, LDLDIRECT  Physical Exam:    VS:  BP 140/64   Pulse 62   Ht 5\' 4"  (1.626 m)   Wt 145 lb (65.8 kg)   SpO2 96%   BMI 24.89 kg/m     Wt Readings from Last 3 Encounters:  11/09/20 145 lb (65.8 kg)  10/12/20 148 lb (67.1 kg)  09/09/20 149 lb (67.6 kg)     GEN:  Well nourished, well developed in no acute distress HEENT: Normal NECK: No JVD; No carotid bruits LYMPHATICS: No lymphadenopathy CARDIAC: RRR, no murmurs, rubs, gallops RESPIRATORY:  Clear to auscultation without rales, wheezing or rhonchi  ABDOMEN: Soft, non-tender, non-distended MUSCULOSKELETAL:  No edema; No deformity  SKIN: Warm and dry NEUROLOGIC:  Alert and oriented x 3 PSYCHIATRIC:  Normal affect   ASSESSMENT:    1. Persistent atrial fibrillation ([place])   2. Pre-procedure lab exam   3. SOB (shortness of breath)   4. Presence of Watchman left atrial appendage closure device   5. Chronic heart failure with preserved ejection fraction (H[facility])    PLAN:    In order of problems listed above:  1. Persistent atrial fibrillation Amiodarone has  organized her atrial fibrillation into an atypical atrial flutter.  Plan to schedule a TEE guided cardioversion.  I will still do a TEE despite the watchman device being in place given the relatively recent implant.  We will plan to use Plavix around the time of cardioversion.  2.  Shortness of breath I suspect this is related to her persistent atrial fibrillation/flutter.  TEE guided cardioversion as above.  3.  Presence of watchman device Last TEE showed a well-positioned device without leak.  Plan to continue Plavix.  6 months after implant, can transition to aspirin monotherapy.  4.  Chronic heart failure with preserved ejection fraction Patient plans to transition to a weight-based dosing of Lasix.  She weighs herself daily and is very in tune to her volume status.  I suspect her diuretic requirement will decrease when she is in normal rhythm.  Continue metoprolol.  Follow-up 3 months.  Will need CMP, TSH, free T4 at that appointment.   Medication Adjustments/Labs and Tests Ordered: Current medicines are reviewed at length with the patient today.  Concerns regarding medicines are outlined above.  Orders Placed This Encounter  Procedures  . Basic metabolic panel  . CBC  . EKG 12-Lead   No orders of the defined types were placed in this encounter.    Signed, Lars Mage, MD, [facility]  11/09/2020 11:08 AM    Electrophysiology [place]

## 2020-11-10 DIAGNOSIS — N183 Chronic kidney disease, stage 3 unspecified: Secondary | ICD-10-CM | POA: Diagnosis not present

## 2020-11-10 DIAGNOSIS — F339 Major depressive disorder, recurrent, unspecified: Secondary | ICD-10-CM | POA: Diagnosis not present

## 2020-11-10 DIAGNOSIS — E113299 Type 2 diabetes mellitus with mild nonproliferative diabetic retinopathy without macular edema, unspecified eye: Secondary | ICD-10-CM | POA: Diagnosis not present

## 2020-11-10 DIAGNOSIS — E1142 Type 2 diabetes mellitus with diabetic polyneuropathy: Secondary | ICD-10-CM | POA: Diagnosis not present

## 2020-11-10 DIAGNOSIS — E1122 Type 2 diabetes mellitus with diabetic chronic kidney disease: Secondary | ICD-10-CM | POA: Diagnosis not present

## 2020-11-10 DIAGNOSIS — I509 Heart failure, unspecified: Secondary | ICD-10-CM | POA: Diagnosis not present

## 2020-11-10 DIAGNOSIS — I4819 Other persistent atrial fibrillation: Secondary | ICD-10-CM | POA: Diagnosis not present

## 2020-11-10 DIAGNOSIS — D631 Anemia in chronic kidney disease: Secondary | ICD-10-CM | POA: Diagnosis not present

## 2020-11-10 DIAGNOSIS — I130 Hypertensive heart and chronic kidney disease with heart failure and stage 1 through stage 4 chronic kidney disease, or unspecified chronic kidney disease: Secondary | ICD-10-CM | POA: Diagnosis not present

## 2020-11-11 DIAGNOSIS — E1142 Type 2 diabetes mellitus with diabetic polyneuropathy: Secondary | ICD-10-CM | POA: Diagnosis not present

## 2020-11-11 DIAGNOSIS — F339 Major depressive disorder, recurrent, unspecified: Secondary | ICD-10-CM | POA: Diagnosis not present

## 2020-11-11 DIAGNOSIS — I130 Hypertensive heart and chronic kidney disease with heart failure and stage 1 through stage 4 chronic kidney disease, or unspecified chronic kidney disease: Secondary | ICD-10-CM | POA: Diagnosis not present

## 2020-11-11 DIAGNOSIS — I509 Heart failure, unspecified: Secondary | ICD-10-CM | POA: Diagnosis not present

## 2020-11-11 DIAGNOSIS — I4819 Other persistent atrial fibrillation: Secondary | ICD-10-CM | POA: Diagnosis not present

## 2020-11-11 DIAGNOSIS — D631 Anemia in chronic kidney disease: Secondary | ICD-10-CM | POA: Diagnosis not present

## 2020-11-11 DIAGNOSIS — E113299 Type 2 diabetes mellitus with mild nonproliferative diabetic retinopathy without macular edema, unspecified eye: Secondary | ICD-10-CM | POA: Diagnosis not present

## 2020-11-11 DIAGNOSIS — N183 Chronic kidney disease, stage 3 unspecified: Secondary | ICD-10-CM | POA: Diagnosis not present

## 2020-11-11 DIAGNOSIS — E1122 Type 2 diabetes mellitus with diabetic chronic kidney disease: Secondary | ICD-10-CM | POA: Diagnosis not present

## 2020-11-12 DIAGNOSIS — I5020 Unspecified systolic (congestive) heart failure: Secondary | ICD-10-CM | POA: Diagnosis not present

## 2020-11-15 DIAGNOSIS — N183 Chronic kidney disease, stage 3 unspecified: Secondary | ICD-10-CM | POA: Diagnosis not present

## 2020-11-15 DIAGNOSIS — E1142 Type 2 diabetes mellitus with diabetic polyneuropathy: Secondary | ICD-10-CM | POA: Diagnosis not present

## 2020-11-15 DIAGNOSIS — I130 Hypertensive heart and chronic kidney disease with heart failure and stage 1 through stage 4 chronic kidney disease, or unspecified chronic kidney disease: Secondary | ICD-10-CM | POA: Diagnosis not present

## 2020-11-15 DIAGNOSIS — E113299 Type 2 diabetes mellitus with mild nonproliferative diabetic retinopathy without macular edema, unspecified eye: Secondary | ICD-10-CM | POA: Diagnosis not present

## 2020-11-15 DIAGNOSIS — I4819 Other persistent atrial fibrillation: Secondary | ICD-10-CM | POA: Diagnosis not present

## 2020-11-15 DIAGNOSIS — E1122 Type 2 diabetes mellitus with diabetic chronic kidney disease: Secondary | ICD-10-CM | POA: Diagnosis not present

## 2020-11-15 DIAGNOSIS — F339 Major depressive disorder, recurrent, unspecified: Secondary | ICD-10-CM | POA: Diagnosis not present

## 2020-11-15 DIAGNOSIS — D631 Anemia in chronic kidney disease: Secondary | ICD-10-CM | POA: Diagnosis not present

## 2020-11-15 DIAGNOSIS — I509 Heart failure, unspecified: Secondary | ICD-10-CM | POA: Diagnosis not present

## 2020-11-16 ENCOUNTER — Other Ambulatory Visit (HOSPITAL_COMMUNITY)
Admission: RE | Admit: 2020-11-16 | Discharge: 2020-11-16 | Disposition: A | Discharge status: Home / Self Care | Payer: Medicare PPO | Source: Ambulatory Visit | Attending: Cardiology | Admitting: Cardiology

## 2020-11-16 DIAGNOSIS — Z01812 Encounter for preprocedural laboratory examination: Secondary | ICD-10-CM | POA: Diagnosis not present

## 2020-11-16 DIAGNOSIS — Z20822 Contact with and (suspected) exposure to covid-19: Secondary | ICD-10-CM | POA: Diagnosis not present

## 2020-11-16 LAB — SARS CORONAVIRUS 2 (TAT 6-24 HRS): SARS Coronavirus 2: NEGATIVE

## 2020-11-18 ENCOUNTER — Other Ambulatory Visit: Payer: Self-pay

## 2020-11-18 ENCOUNTER — Ambulatory Visit (HOSPITAL_COMMUNITY): Payer: Medicare PPO | Admitting: Certified Registered Nurse Anesthetist

## 2020-11-18 ENCOUNTER — Encounter (HOSPITAL_COMMUNITY): Payer: Self-pay | Admitting: Cardiology

## 2020-11-18 ENCOUNTER — Ambulatory Visit (HOSPITAL_BASED_OUTPATIENT_CLINIC_OR_DEPARTMENT_OTHER)
Admission: RE | Admit: 2020-11-18 | Discharge: 2020-11-18 | Disposition: A | Discharge status: Home / Self Care | Payer: Medicare PPO | Source: Ambulatory Visit | Attending: Cardiology | Admitting: Cardiology

## 2020-11-18 ENCOUNTER — Ambulatory Visit (HOSPITAL_COMMUNITY)
Admission: RE | Admit: 2020-11-18 | Discharge: 2020-11-18 | Disposition: A | Discharge status: Home / Self Care | Payer: Medicare PPO | Attending: Cardiology | Admitting: Cardiology

## 2020-11-18 ENCOUNTER — Encounter (HOSPITAL_COMMUNITY)
Admission: RE | Disposition: A | Discharge status: Home / Self Care | Payer: Self-pay | Source: Home / Self Care | Attending: Cardiology

## 2020-11-18 DIAGNOSIS — Z90710 Acquired absence of both cervix and uterus: Secondary | ICD-10-CM | POA: Insufficient documentation

## 2020-11-18 DIAGNOSIS — I5032 Chronic diastolic (congestive) heart failure: Secondary | ICD-10-CM | POA: Insufficient documentation

## 2020-11-18 DIAGNOSIS — D62 Acute posthemorrhagic anemia: Secondary | ICD-10-CM | POA: Diagnosis not present

## 2020-11-18 DIAGNOSIS — I130 Hypertensive heart and chronic kidney disease with heart failure and stage 1 through stage 4 chronic kidney disease, or unspecified chronic kidney disease: Secondary | ICD-10-CM | POA: Diagnosis not present

## 2020-11-18 DIAGNOSIS — E1136 Type 2 diabetes mellitus with diabetic cataract: Secondary | ICD-10-CM | POA: Diagnosis not present

## 2020-11-18 DIAGNOSIS — I4819 Other persistent atrial fibrillation: Secondary | ICD-10-CM | POA: Diagnosis not present

## 2020-11-18 DIAGNOSIS — Z79899 Other long term (current) drug therapy: Secondary | ICD-10-CM | POA: Diagnosis not present

## 2020-11-18 DIAGNOSIS — Z794 Long term (current) use of insulin: Secondary | ICD-10-CM | POA: Diagnosis not present

## 2020-11-18 DIAGNOSIS — I484 Atypical atrial flutter: Secondary | ICD-10-CM | POA: Diagnosis not present

## 2020-11-18 DIAGNOSIS — Z885 Allergy status to narcotic agent status: Secondary | ICD-10-CM | POA: Diagnosis not present

## 2020-11-18 DIAGNOSIS — I081 Rheumatic disorders of both mitral and tricuspid valves: Secondary | ICD-10-CM | POA: Diagnosis not present

## 2020-11-18 DIAGNOSIS — Z7902 Long term (current) use of antithrombotics/antiplatelets: Secondary | ICD-10-CM | POA: Insufficient documentation

## 2020-11-18 DIAGNOSIS — Z8249 Family history of ischemic heart disease and other diseases of the circulatory system: Secondary | ICD-10-CM | POA: Diagnosis not present

## 2020-11-18 DIAGNOSIS — E1151 Type 2 diabetes mellitus with diabetic peripheral angiopathy without gangrene: Secondary | ICD-10-CM | POA: Insufficient documentation

## 2020-11-18 DIAGNOSIS — N189 Chronic kidney disease, unspecified: Secondary | ICD-10-CM | POA: Diagnosis not present

## 2020-11-18 DIAGNOSIS — E1122 Type 2 diabetes mellitus with diabetic chronic kidney disease: Secondary | ICD-10-CM | POA: Insufficient documentation

## 2020-11-18 DIAGNOSIS — Z888 Allergy status to other drugs, medicaments and biological substances status: Secondary | ICD-10-CM | POA: Diagnosis not present

## 2020-11-18 DIAGNOSIS — Z9981 Dependence on supplemental oxygen: Secondary | ICD-10-CM | POA: Diagnosis not present

## 2020-11-18 DIAGNOSIS — Z90721 Acquired absence of ovaries, unilateral: Secondary | ICD-10-CM | POA: Insufficient documentation

## 2020-11-18 DIAGNOSIS — Z96652 Presence of left artificial knee joint: Secondary | ICD-10-CM | POA: Diagnosis not present

## 2020-11-18 DIAGNOSIS — F418 Other specified anxiety disorders: Secondary | ICD-10-CM | POA: Diagnosis not present

## 2020-11-18 DIAGNOSIS — I340 Nonrheumatic mitral (valve) insufficiency: Secondary | ICD-10-CM

## 2020-11-18 DIAGNOSIS — I361 Nonrheumatic tricuspid (valve) insufficiency: Secondary | ICD-10-CM

## 2020-11-18 DIAGNOSIS — E7800 Pure hypercholesterolemia, unspecified: Secondary | ICD-10-CM | POA: Diagnosis not present

## 2020-11-18 DIAGNOSIS — I313 Pericardial effusion (noninflammatory): Secondary | ICD-10-CM | POA: Insufficient documentation

## 2020-11-18 HISTORY — PX: CARDIOVERSION: SHX1299

## 2020-11-18 HISTORY — PX: TEE WITHOUT CARDIOVERSION: SHX5443

## 2020-11-18 LAB — GLUCOSE, CAPILLARY: Glucose-Capillary: 127 mg/dL — ABNORMAL HIGH (ref 70–99)

## 2020-11-18 SURGERY — ECHOCARDIOGRAM, TRANSESOPHAGEAL
Anesthesia: Monitor Anesthesia Care

## 2020-11-18 MED ORDER — PROPOFOL 500 MG/50ML IV EMUL
INTRAVENOUS | Status: DC | PRN
  Administered 2020-11-18: 80 ug/kg/min via INTRAVENOUS

## 2020-11-18 MED ORDER — LIDOCAINE 2% (20 MG/ML) 5 ML SYRINGE
INTRAMUSCULAR | Status: DC | PRN
  Administered 2020-11-18: 60 mg via INTRAVENOUS

## 2020-11-18 MED ORDER — SODIUM CHLORIDE 0.9 % IV SOLN: INTRAVENOUS | Status: DC

## 2020-11-18 MED ORDER — PROPOFOL 10 MG/ML IV BOLUS
INTRAVENOUS | Status: DC | PRN
  Administered 2020-11-18 (×2): 25 mg via INTRAVENOUS

## 2020-11-18 NOTE — Anesthesia Postprocedure Evaluation (Signed)
Anesthesia Post Note  Patient: [name]  Procedure(s) Performed: TRANSESOPHAGEAL ECHOCARDIOGRAM (TEE) (N/A ) CARDIOVERSION (N/A )     Patient location during evaluation: PACU Anesthesia Type: MAC Level of consciousness: awake Pain management: pain level controlled Vital Signs Assessment: post-procedure vital signs reviewed and stable Respiratory status: spontaneous breathing and respiratory function stable Cardiovascular status: stable Postop Assessment: no apparent nausea or vomiting Anesthetic complications: no   No complications documented.  Last Vitals:  Vitals:   11/18/20 0934 11/18/20 0944  BP: (!) 146/53 (!) 160/54  Pulse: 66 65  Resp: 18 13  Temp:    SpO2: 96% 95%    Last Pain:  Vitals:   11/18/20 0944  TempSrc:   PainSc: 0-No pain                 Merlinda Frederick

## 2020-11-18 NOTE — Discharge Instructions (Signed)
Electrical Cardioversion Electrical cardioversion is the delivery of a jolt of electricity to restore a normal rhythm to the heart. A rhythm that is too fast or is not regular keeps the heart from pumping well. In this procedure, sticky patches or metal paddles are placed on the chest to deliver electricity to the heart from a device. This procedure may be done in an emergency if:  There is low or no blood pressure as a result of the heart rhythm.  Normal rhythm must be restored as fast as possible to protect the brain and heart from further damage.  It may save a life. This may also be a scheduled procedure for irregular or fast heart rhythms that are not immediately life-threatening. Tell a health care provider about:  Any allergies you have.  All medicines you are taking, including vitamins, herbs, eye drops, creams, and over-the-counter medicines.  Any problems you or family members have had with anesthetic medicines.  Any blood disorders you have.  Any surgeries you have had.  Any medical conditions you have.  Whether you are pregnant or may be pregnant. What are the risks? Generally, this is a safe procedure. However, problems may occur, including:  Allergic reactions to medicines.  A blood clot that breaks free and travels to other parts of your body.  The possible return of an abnormal heart rhythm within hours or days after the procedure.  Your heart stopping (cardiac arrest). This is rare. What happens before the procedure? Medicines  Your health care provider may have you start taking: ? Blood-thinning medicines (anticoagulants) so your blood does not clot as easily. ? Medicines to help stabilize your heart rate and rhythm.  Ask your health care provider about: ? Changing or stopping your regular medicines. This is especially important if you are taking diabetes medicines or blood thinners. ? Taking medicines such as aspirin and ibuprofen. These medicines can  thin your blood. Do not take these medicines unless your health care provider tells you to take them. ? Taking over-the-counter medicines, vitamins, herbs, and supplements. General instructions  Follow instructions from your health care provider about eating or drinking restrictions.  Plan to have someone take you home from the hospital or clinic.  If you will be going home right after the procedure, plan to have someone with you for 24 hours.  Ask your health care provider what steps will be taken to help prevent infection. These may include washing your skin with a germ-killing soap. What happens during the procedure?  An IV will be inserted into one of your veins.  Sticky patches (electrodes) or metal paddles may be placed on your chest.  You will be given a medicine to help you relax (sedative).  An electrical shock will be delivered. The procedure may vary among health care providers and hospitals.   What can I expect after the procedure?  Your blood pressure, heart rate, breathing rate, and blood oxygen level will be monitored until you leave the hospital or clinic.  Your heart rhythm will be watched to make sure it does not change.  You may have some redness on the skin where the shocks were given. Follow these instructions at home:  Do not drive for 24 hours if you were given a sedative during your procedure.  Take over-the-counter and prescription medicines only as told by your health care provider.  Ask your health care provider how to check your pulse. Check it often.  Rest for 48 hours after the procedure   or as told by your health care provider.  Avoid or limit your caffeine use as told by your health care provider.  Keep all follow-up visits as told by your health care provider. This is important. Contact a health care provider if:  You feel like your heart is beating too quickly or your pulse is not regular.  You have a serious muscle cramp that does not go  away. Get help right away if:  You have discomfort in your chest.  You are dizzy or you feel faint.  You have trouble breathing or you are short of breath.  Your speech is slurred.  You have trouble moving an arm or leg on one side of your body.  Your fingers or toes turn cold or blue. Summary  Electrical cardioversion is the delivery of a jolt of electricity to restore a normal rhythm to the heart.  This procedure may be done right away in an emergency or may be a scheduled procedure if the condition is not an emergency.  Generally, this is a safe procedure.  After the procedure, check your pulse often as told by your health care provider. This information is not intended to replace advice given to you by your health care provider. Make sure you discuss any questions you have with your health care provider. Document Revised: 03/17/2019 Document Reviewed: 03/17/2019 Elsevier Patient Education  [place]. Transesophageal Echocardiogram Transesophageal echocardiogram (TEE) is a test that uses sound waves to take pictures of your heart. TEE is done by passing a small probe attached to a flexible tube down the part of the body that moves food from your mouth to your stomach (esophagus). The pictures give clear images of your heart. This can help your doctor see if there are problems with your heart. Tell a doctor about:  Any allergies you have.  All medicines you are taking. This includes vitamins, herbs, eye drops, creams, and over-the-counter medicines.  Any problems you or family members have had with anesthetic medicines.  Any blood disorders you have.  Any surgeries you have had.  Any medical conditions you have.  Any swallowing problems.  Whether you have or have had a blockage in the part of the body that moves food from your mouth to your stomach.  Whether you are pregnant or may be pregnant. What are the risks? In general, this is a safe procedure. But,  problems may occur, such as:  Damage to nearby structures or organs.  A tear in the part of the body that moves food from your mouth to your stomach.  Irregular heartbeat.  Hoarse voice or trouble swallowing.  Bleeding. What happens before the procedure? Medicines  Ask your doctor about changing or stopping: ? Your normal medicines. ? Vitamins, herbs, and supplements. ? Over-the-counter medicines.  Do not take aspirin or ibuprofen unless you are told to. General instructions  Follow instructions from your doctor about what you cannot eat or drink.  You will take out any dentures or dental retainers.  Plan to have a responsible adult take you home from the hospital or clinic.  Plan to have a responsible adult care for you for the time you are told after you leave the hospital or clinic. This is important. What happens during the procedure?  An IV will be put into one of your veins.  You may be given: ? A sedative. This medicine helps you relax. ? A medicine to numb the back of your throat. This may be  sprayed or gargled.  Your blood pressure, heart rate, and breathing will be watched.  You may be asked to lie on your left side.  A bite block will be placed in your mouth. This keeps you from biting the tube.  The tip of the probe will be placed into the back of your mouth.  You will be asked to swallow.  Your doctor will take pictures of your heart.  The probe and bite block will be taken out after the test is done. The procedure may vary among doctors and hospitals.   What can I expect after the procedure?  You will be monitored until you leave the hospital or clinic. This includes checking your blood pressure, heart rate, breathing rate, and blood oxygen level.  Your throat may feel sore and numb. This will get better over time. You will not be allowed to eat or drink until the numbness has gone away.  It is common to have a sore throat for a day or two.  It  is up to you to get the results of your procedure. Ask how to get your results when they are ready. Follow these instructions at home:  If you were given a sedative during your procedure, do not drive or use machines until your doctor says that it is safe.  Return to your normal activities when your doctor says that it is safe.  Keep all follow-up visits. Summary  TEE is a test that uses sound waves to take pictures of your heart.  You will be given a medicine to help you relax.  Do not drive or use machines until your doctor says that it is safe. This information is not intended to replace advice given to you by your health care provider. Make sure you discuss any questions you have with your health care provider. Document Revised: 04/06/2020 Document Reviewed: 04/06/2020 Elsevier Patient Education  2021 [employer].

## 2020-11-18 NOTE — Interval H&P Note (Signed)
History and Physical Interval Note:  11/18/2020 8:43 AM  [name]  has presented today for surgery, with the diagnosis of AFIB.  The various methods of treatment have been discussed with the patient and family. After consideration of risks, benefits and other options for treatment, the patient has consented to  Procedure(s): TRANSESOPHAGEAL ECHOCARDIOGRAM (TEE) (N/A) CARDIOVERSION (N/A) as a surgical intervention.  The patient's history has been reviewed, patient examined, no change in status, stable for surgery.  I have reviewed the patient's chart and labs.  Questions were answered to the patient's satisfaction.     Donato Heinz

## 2020-11-18 NOTE — CV Procedure (Signed)
   TRANSESOPHAGEAL ECHOCARDIOGRAM GUIDED DIRECT CURRENT CARDIOVERSION  NAME:  BINDI KLOMP   MRN: [id number] DOB:  [birth year]   ADMIT DATE: 11/18/2020  INDICATIONS: Symptomatic atrial fibrillation  PROCEDURE:   Informed consent was obtained prior to the procedure. The risks, benefits and alternatives for the procedure were discussed and the patient comprehended these risks.  Risks include, but are not limited to, cough, sore throat, vomiting, nausea, somnolence, esophageal and stomach trauma or perforation, bleeding, low blood pressure, aspiration, pneumonia, infection, trauma to the teeth and death.    After a procedural time-out, the oropharynx was anesthetized and the patient was sedated by the anesthesia service. The transesophageal probe was inserted in the esophagus and stomach without difficulty and multiple views were obtained. Anesthesia was monitored by Dr. Elgie Congo and Everlean Cherry, CRNA.   COMPLICATIONS:    Complications: No complications Patient tolerated procedure well.  FINDINGS:  Watchman appears well sealed.  No thrombus seen.   CARDIOVERSION:     Indications:  Symptomatic Atrial Fibrillation  Procedure Details:  Once the TEE was complete, the patient had the defibrillator pads placed in the anterior and posterior position. Once an appropriate level of sedation was confirmed, the patient was cardioverted x 1 with 200J of biphasic synchronized energy.  The patient converted to NSR.  There were no apparent complications.  The patient had normal neuro status and respiratory status post procedure with vitals stable as recorded elsewhere.  Adequate airway was maintained throughout and vital signs monitored per protocol.  Oswaldo Milian MD [facility]  [address] [place], [place] [address] [phone]   3:48 PM

## 2020-11-18 NOTE — Anesthesia Preprocedure Evaluation (Addendum)
Anesthesia Evaluation  Patient identified by MRN, date of birth, ID band Patient awake    Reviewed: Allergy & Precautions, NPO status , Patient's Chart, lab work & pertinent test results  History of Anesthesia Complications (+) PONV and history of anesthetic complications  Airway Mallampati: III  TM Distance: >3 FB Neck ROM: Full    Dental no notable dental hx.    Pulmonary asthma ,    Pulmonary exam normal breath sounds clear to auscultation       Cardiovascular hypertension, + CAD and + Peripheral Vascular Disease  Normal cardiovascular exam Rhythm:Regular Rate:Normal  08/2020: 1. Since Watchman FLX well sealed and no thrombus proceded with [facility] with patient only on plavix. [place] x 1 with 150J Converted to NSR with no cmplicaitons. 2. Left ventricular ejection fraction, by estimation, is 60 to 65%. The left ventricle has normal function. 3. Right ventricular systolic function is normal. The right ventricular size is normal. 4. 27 mm Watchman FLX device in good position with no leak. No thrombus and average compression in multiple views 28%. Left atrial size was moderately dilated. No left atrial/left atrial appendage thrombus was detected. 5. Right atrial size was moderately dilated. 6. The mitral valve is abnormal. Mild mitral valve regurgitation. 7. The aortic valve is tricuspid. Aortic valve regurgitation is mild. Mild to moderate aortic valve sclerosis/calcification is present, without any evidence of aortic stenosis.   Neuro/Psych PSYCHIATRIC DISORDERS Anxiety Depression  Neuromuscular disease (peripheral neuropathy)    GI/Hepatic Neg liver ROS, hiatal hernia, GERD  ,  Endo/Other  negative endocrine ROSdiabetes  Renal/GU Renal InsufficiencyRenal disease  negative genitourinary   Musculoskeletal  (+) Arthritis ,   Abdominal   Peds negative pediatric ROS (+)  Hematology  (+) anemia ,   Anesthesia Other  Findings   Reproductive/Obstetrics negative OB ROS                            Anesthesia Physical Anesthesia Plan  ASA: III  Anesthesia Plan: MAC   Post-op Pain Management:    Induction: Intravenous  PONV Risk Score and Plan: 3 and Ondansetron and Treatment may vary due to age or medical condition  Airway Management Planned: Natural Airway  Additional Equipment: None  Intra-op Plan:   Post-operative Plan:   Informed Consent: I have reviewed the patients History and Physical, chart, labs and discussed the procedure including the risks, benefits and alternatives for the proposed anesthesia with the patient or authorized representative who has indicated his/her understanding and acceptance.       Plan Discussed with: CRNA and Anesthesiologist  Anesthesia Plan Comments:        Anesthesia Quick Evaluation

## 2020-11-18 NOTE — Anesthesia Procedure Notes (Signed)
Procedure Name: MAC Date/Time: 11/18/2020 8:56 AM Performed by: Colin Benton, CRNA Pre-anesthesia Checklist: Patient identified, Emergency Drugs available, Suction available and Patient being monitored Patient Re-evaluated:Patient Re-evaluated prior to induction Oxygen Delivery Method: Nasal cannula Preoxygenation: Pre-oxygenation with 100% oxygen Induction Type: IV induction Placement Confirmation: positive ETCO2 Dental Injury: Teeth and Oropharynx as per pre-operative assessment

## 2020-11-18 NOTE — Progress Notes (Signed)
  Echocardiogram Echocardiogram Transesophageal color, spectral, and 3D has been performed.  [name] M 11/18/2020, 10:18 AM

## 2020-11-18 NOTE — Transfer of Care (Signed)
Immediate Anesthesia Transfer of Care Note  Patient: [name]  Procedure(s) Performed: TRANSESOPHAGEAL ECHOCARDIOGRAM (TEE) (N/A ) CARDIOVERSION (N/A )  Patient Location: Endoscopy Unit  Anesthesia Type:MAC  Level of Consciousness: awake, alert , oriented and patient cooperative  Airway & Oxygen Therapy: Patient Spontanous Breathing and Patient connected to nasal cannula oxygen  Post-op Assessment: Report given to RN and Post -op Vital signs reviewed and stable  Post vital signs: Reviewed and stable  Last Vitals:  Vitals Value Taken Time  BP 141/60 11/18/20 0924  Temp    Pulse 65 11/18/20 0925  Resp 16 11/18/20 0925  SpO2 96 % 11/18/20 0925  Vitals shown include unvalidated device data.  Last Pain:  Vitals:   11/18/20 0813  TempSrc: Temporal  PainSc: 0-No pain         Complications: No complications documented.

## 2020-11-19 ENCOUNTER — Emergency Department (HOSPITAL_COMMUNITY): Payer: Medicare PPO

## 2020-11-19 ENCOUNTER — Encounter (HOSPITAL_COMMUNITY): Payer: Self-pay

## 2020-11-19 ENCOUNTER — Observation Stay (HOSPITAL_COMMUNITY)
Admission: EM | Admit: 2020-11-19 | Discharge: 2020-11-21 | Disposition: A | Discharge status: Home / Self Care | Payer: Medicare PPO | Attending: Internal Medicine | Admitting: Internal Medicine

## 2020-11-19 ENCOUNTER — Other Ambulatory Visit: Payer: Self-pay

## 2020-11-19 DIAGNOSIS — R519 Headache, unspecified: Secondary | ICD-10-CM | POA: Diagnosis not present

## 2020-11-19 DIAGNOSIS — I5033 Acute on chronic diastolic (congestive) heart failure: Secondary | ICD-10-CM | POA: Insufficient documentation

## 2020-11-19 DIAGNOSIS — Z79899 Other long term (current) drug therapy: Secondary | ICD-10-CM | POA: Insufficient documentation

## 2020-11-19 DIAGNOSIS — E1142 Type 2 diabetes mellitus with diabetic polyneuropathy: Secondary | ICD-10-CM | POA: Diagnosis present

## 2020-11-19 DIAGNOSIS — Z95818 Presence of other cardiac implants and grafts: Secondary | ICD-10-CM | POA: Diagnosis present

## 2020-11-19 DIAGNOSIS — I509 Heart failure, unspecified: Secondary | ICD-10-CM | POA: Diagnosis not present

## 2020-11-19 DIAGNOSIS — J45909 Unspecified asthma, uncomplicated: Secondary | ICD-10-CM | POA: Diagnosis not present

## 2020-11-19 DIAGNOSIS — I130 Hypertensive heart and chronic kidney disease with heart failure and stage 1 through stage 4 chronic kidney disease, or unspecified chronic kidney disease: Secondary | ICD-10-CM | POA: Insufficient documentation

## 2020-11-19 DIAGNOSIS — J9601 Acute respiratory failure with hypoxia: Secondary | ICD-10-CM | POA: Diagnosis not present

## 2020-11-19 DIAGNOSIS — Z20822 Contact with and (suspected) exposure to covid-19: Secondary | ICD-10-CM | POA: Insufficient documentation

## 2020-11-19 DIAGNOSIS — R0902 Hypoxemia: Secondary | ICD-10-CM | POA: Diagnosis not present

## 2020-11-19 DIAGNOSIS — J9621 Acute and chronic respiratory failure with hypoxia: Secondary | ICD-10-CM | POA: Diagnosis present

## 2020-11-19 DIAGNOSIS — N1831 Chronic kidney disease, stage 3a: Secondary | ICD-10-CM | POA: Diagnosis not present

## 2020-11-19 DIAGNOSIS — Z9981 Dependence on supplemental oxygen: Secondary | ICD-10-CM | POA: Diagnosis not present

## 2020-11-19 DIAGNOSIS — N183 Chronic kidney disease, stage 3 unspecified: Secondary | ICD-10-CM | POA: Diagnosis present

## 2020-11-19 DIAGNOSIS — I169 Hypertensive crisis, unspecified: Principal | ICD-10-CM | POA: Diagnosis present

## 2020-11-19 DIAGNOSIS — E876 Hypokalemia: Secondary | ICD-10-CM | POA: Insufficient documentation

## 2020-11-19 DIAGNOSIS — I2510 Atherosclerotic heart disease of native coronary artery without angina pectoris: Secondary | ICD-10-CM | POA: Insufficient documentation

## 2020-11-19 DIAGNOSIS — Z96653 Presence of artificial knee joint, bilateral: Secondary | ICD-10-CM | POA: Diagnosis not present

## 2020-11-19 DIAGNOSIS — I10 Essential (primary) hypertension: Secondary | ICD-10-CM | POA: Diagnosis not present

## 2020-11-19 DIAGNOSIS — G4489 Other headache syndrome: Secondary | ICD-10-CM | POA: Diagnosis not present

## 2020-11-19 DIAGNOSIS — I110 Hypertensive heart disease with heart failure: Secondary | ICD-10-CM | POA: Diagnosis not present

## 2020-11-19 DIAGNOSIS — R0602 Shortness of breath: Secondary | ICD-10-CM | POA: Diagnosis not present

## 2020-11-19 DIAGNOSIS — I517 Cardiomegaly: Secondary | ICD-10-CM | POA: Diagnosis not present

## 2020-11-19 DIAGNOSIS — R0989 Other specified symptoms and signs involving the circulatory and respiratory systems: Secondary | ICD-10-CM | POA: Diagnosis not present

## 2020-11-19 DIAGNOSIS — Z794 Long term (current) use of insulin: Secondary | ICD-10-CM | POA: Insufficient documentation

## 2020-11-19 DIAGNOSIS — I5031 Acute diastolic (congestive) heart failure: Secondary | ICD-10-CM

## 2020-11-19 DIAGNOSIS — E1122 Type 2 diabetes mellitus with diabetic chronic kidney disease: Secondary | ICD-10-CM | POA: Insufficient documentation

## 2020-11-19 LAB — CBC WITH DIFFERENTIAL/PLATELET
Abs Immature Granulocytes: 0.07 10*3/uL (ref 0.00–0.07)
Basophils Absolute: 0.1 10*3/uL (ref 0.0–0.1)
Basophils Relative: 0 %
Eosinophils Absolute: 0.1 10*3/uL (ref 0.0–0.5)
Eosinophils Relative: 1 %
HCT: 47.5 % — ABNORMAL HIGH (ref 36.0–46.0)
Hemoglobin: 14.4 g/dL (ref 12.0–15.0)
Immature Granulocytes: 1 %
Lymphocytes Relative: 10 %
Lymphs Abs: 1.4 10*3/uL (ref 0.7–4.0)
MCH: 27.1 pg (ref 26.0–34.0)
MCHC: 30.3 g/dL (ref 30.0–36.0)
MCV: 89.5 fL (ref 80.0–100.0)
Monocytes Absolute: 0.9 10*3/uL (ref 0.1–1.0)
Monocytes Relative: 6 %
Neutro Abs: 12.1 10*3/uL — ABNORMAL HIGH (ref 1.7–7.7)
Neutrophils Relative %: 82 %
Platelets: 233 10*3/uL (ref 150–400)
RBC: 5.31 MIL/uL — ABNORMAL HIGH (ref 3.87–5.11)
RDW: 15.8 % — ABNORMAL HIGH (ref 11.5–15.5)
WBC: 14.6 10*3/uL — ABNORMAL HIGH (ref 4.0–10.5)
nRBC: 0 % (ref 0.0–0.2)

## 2020-11-19 LAB — I-STAT ARTERIAL BLOOD GAS, ED
Acid-Base Excess: 4 mmol/L — ABNORMAL HIGH (ref 0.0–2.0)
Bicarbonate: 33.5 mmol/L — ABNORMAL HIGH (ref 20.0–28.0)
Calcium, Ion: 1.31 mmol/L (ref 1.15–1.40)
HCT: 45 % (ref 36.0–46.0)
Hemoglobin: 15.3 g/dL — ABNORMAL HIGH (ref 12.0–15.0)
O2 Saturation: 99 %
Potassium: 4.3 mmol/L (ref 3.5–5.1)
Sodium: 138 mmol/L (ref 135–145)
TCO2: 36 mmol/L — ABNORMAL HIGH (ref 22–32)
pCO2 arterial: 72.7 mmHg (ref 32.0–48.0)
pH, Arterial: 7.271 — ABNORMAL LOW (ref 7.350–7.450)
pO2, Arterial: 159 mmHg — ABNORMAL HIGH (ref 83.0–108.0)

## 2020-11-19 LAB — URINALYSIS, ROUTINE W REFLEX MICROSCOPIC
Bilirubin Urine: NEGATIVE
Glucose, UA: NEGATIVE mg/dL
Hgb urine dipstick: NEGATIVE
Ketones, ur: NEGATIVE mg/dL
Leukocytes,Ua: NEGATIVE
Nitrite: NEGATIVE
Protein, ur: NEGATIVE mg/dL
Specific Gravity, Urine: 1.008 (ref 1.005–1.030)
pH: 5 (ref 5.0–8.0)

## 2020-11-19 LAB — COMPREHENSIVE METABOLIC PANEL
ALT: 28 U/L (ref 0–44)
AST: 22 U/L (ref 15–41)
Albumin: 4 g/dL (ref 3.5–5.0)
Alkaline Phosphatase: 78 U/L (ref 38–126)
Anion gap: 8 (ref 5–15)
BUN: 21 mg/dL (ref 8–23)
CO2: 29 mmol/L (ref 22–32)
Calcium: 9.6 mg/dL (ref 8.9–10.3)
Chloride: 102 mmol/L (ref 98–111)
Creatinine, Ser: 0.93 mg/dL (ref 0.44–1.00)
GFR, Estimated: >60 mL/min (ref >60)
Glucose, Bld: 170 mg/dL — ABNORMAL HIGH (ref 70–99)
Potassium: 4.3 mmol/L (ref 3.5–5.1)
Sodium: 139 mmol/L (ref 135–145)
Total Bilirubin: 0.8 mg/dL (ref 0.3–1.2)
Total Protein: 6.9 g/dL (ref 6.5–8.1)

## 2020-11-19 LAB — RESP PANEL BY RT-PCR (FLU A&B, COVID) ARPGX2
Influenza A by PCR: NEGATIVE
Influenza B by PCR: NEGATIVE
SARS Coronavirus 2 by RT PCR: NEGATIVE

## 2020-11-19 LAB — BRAIN NATRIURETIC PEPTIDE: B Natriuretic Peptide: 257.1 pg/mL — ABNORMAL HIGH (ref 0.0–100.0)

## 2020-11-19 LAB — LIPASE, BLOOD: Lipase: 29 U/L (ref 11–51)

## 2020-11-19 LAB — TROPONIN I (HIGH SENSITIVITY)
Troponin I (High Sensitivity): 14 ng/L (ref <18)
Troponin I (High Sensitivity): 17 ng/L (ref <18)

## 2020-11-19 LAB — GLUCOSE, CAPILLARY
Glucose-Capillary: 207 mg/dL — ABNORMAL HIGH (ref 70–99)
Glucose-Capillary: 229 mg/dL — ABNORMAL HIGH (ref 70–99)

## 2020-11-19 LAB — MAGNESIUM: Magnesium: 2 mg/dL (ref 1.7–2.4)

## 2020-11-19 MED ORDER — ACETAMINOPHEN 325 MG PO TABS
650.0000 mg | ORAL_TABLET | Freq: Four times a day (QID) | ORAL | Status: DC | PRN
  Administered 2020-11-19: 650 mg via ORAL
  Filled 2020-11-19: qty 2

## 2020-11-19 MED ORDER — SODIUM CHLORIDE 0.9% FLUSH
3.0000 mL | Freq: Two times a day (BID) | INTRAVENOUS | Status: DC
  Administered 2020-11-20 – 2020-11-21 (×2): 3 mL via INTRAVENOUS

## 2020-11-19 MED ORDER — POLYETHYLENE GLYCOL 3350 17 G PO PACK: 17.0000 g | PACK | Freq: Every day | ORAL | Status: DC | PRN

## 2020-11-19 MED ORDER — DOCUSATE SODIUM 100 MG PO CAPS
100.0000 mg | ORAL_CAPSULE | Freq: Two times a day (BID) | ORAL | Status: DC
  Administered 2020-11-19 – 2020-11-21 (×4): 100 mg via ORAL
  Filled 2020-11-19 (×4): qty 1

## 2020-11-19 MED ORDER — GABAPENTIN 300 MG PO CAPS
600.0000 mg | ORAL_CAPSULE | Freq: Three times a day (TID) | ORAL | Status: DC
  Administered 2020-11-19 – 2020-11-20 (×2): 600 mg via ORAL
  Filled 2020-11-19 (×2): qty 2

## 2020-11-19 MED ORDER — METOPROLOL TARTRATE 100 MG PO TABS
100.0000 mg | ORAL_TABLET | Freq: Two times a day (BID) | ORAL | Status: DC
  Administered 2020-11-19 – 2020-11-21 (×5): 100 mg via ORAL
  Filled 2020-11-19 (×4): qty 1
  Filled 2020-11-19: qty 4

## 2020-11-19 MED ORDER — ONDANSETRON HCL 4 MG/2ML IJ SOLN: 4.0000 mg | Freq: Four times a day (QID) | INTRAMUSCULAR | Status: DC | PRN

## 2020-11-19 MED ORDER — TRAZODONE HCL 50 MG PO TABS: 25.0000 mg | ORAL_TABLET | Freq: Every evening | ORAL | Status: DC | PRN

## 2020-11-19 MED ORDER — MAGNESIUM OXIDE 400 (241.3 MG) MG PO TABS
200.0000 mg | ORAL_TABLET | Freq: Two times a day (BID) | ORAL | Status: DC
  Administered 2020-11-19 – 2020-11-21 (×4): 200 mg via ORAL
  Filled 2020-11-19 (×4): qty 1

## 2020-11-19 MED ORDER — INSULIN ASPART 100 UNIT/ML ~~LOC~~ SOLN
0.0000 [IU] | Freq: Every day | SUBCUTANEOUS | Status: DC
  Administered 2020-11-19: 2 [IU] via SUBCUTANEOUS

## 2020-11-19 MED ORDER — VENLAFAXINE HCL 37.5 MG PO TABS
37.5000 mg | ORAL_TABLET | Freq: Two times a day (BID) | ORAL | Status: DC
  Administered 2020-11-19 – 2020-11-21 (×4): 37.5 mg via ORAL
  Filled 2020-11-19 (×5): qty 1

## 2020-11-19 MED ORDER — ALPRAZOLAM 0.25 MG PO TABS
0.2500 mg | ORAL_TABLET | Freq: Every day | ORAL | Status: DC
  Administered 2020-11-19 – 2020-11-20 (×2): 0.25 mg via ORAL
  Filled 2020-11-19 (×2): qty 1

## 2020-11-19 MED ORDER — ALBUTEROL SULFATE HFA 108 (90 BASE) MCG/ACT IN AERS
2.0000 | INHALATION_SPRAY | RESPIRATORY_TRACT | Status: DC | PRN
  Filled 2020-11-19: qty 6.7

## 2020-11-19 MED ORDER — FENTANYL CITRATE (PF) 100 MCG/2ML IJ SOLN
50.0000 ug | Freq: Once | INTRAMUSCULAR | Status: AC
Start: 2020-11-19 — End: 2020-11-19
  Administered 2020-11-19: 50 ug via INTRAVENOUS
  Filled 2020-11-19: qty 2

## 2020-11-19 MED ORDER — FUROSEMIDE 10 MG/ML IJ SOLN
40.0000 mg | Freq: Two times a day (BID) | INTRAMUSCULAR | Status: DC
  Administered 2020-11-19 – 2020-11-21 (×4): 40 mg via INTRAVENOUS
  Filled 2020-11-19 (×4): qty 4

## 2020-11-19 MED ORDER — PRAVASTATIN SODIUM 10 MG PO TABS
20.0000 mg | ORAL_TABLET | Freq: Every day | ORAL | Status: DC
  Administered 2020-11-19 – 2020-11-20 (×2): 20 mg via ORAL
  Filled 2020-11-19 (×3): qty 2

## 2020-11-19 MED ORDER — BISACODYL 5 MG PO TBEC
5.0000 mg | DELAYED_RELEASE_TABLET | Freq: Every day | ORAL | Status: DC | PRN
Start: 2020-11-19 — End: 2020-11-21

## 2020-11-19 MED ORDER — INSULIN ASPART 100 UNIT/ML ~~LOC~~ SOLN
0.0000 [IU] | Freq: Three times a day (TID) | SUBCUTANEOUS | Status: DC
  Administered 2020-11-19: 5 [IU] via SUBCUTANEOUS
  Administered 2020-11-20 (×2): 3 [IU] via SUBCUTANEOUS
  Administered 2020-11-20 – 2020-11-21 (×2): 2 [IU] via SUBCUTANEOUS

## 2020-11-19 MED ORDER — ONDANSETRON HCL 4 MG/2ML IJ SOLN
INTRAMUSCULAR | Status: AC
  Administered 2020-11-19: 4 mg
  Filled 2020-11-19: qty 2

## 2020-11-19 MED ORDER — AMIODARONE HCL 200 MG PO TABS
200.0000 mg | ORAL_TABLET | Freq: Every day | ORAL | Status: DC
  Administered 2020-11-19 – 2020-11-21 (×3): 200 mg via ORAL
  Filled 2020-11-19 (×3): qty 1

## 2020-11-19 MED ORDER — ONDANSETRON HCL 4 MG PO TABS: 4.0000 mg | ORAL_TABLET | Freq: Four times a day (QID) | ORAL | Status: DC | PRN

## 2020-11-19 MED ORDER — INSULIN GLARGINE 100 UNIT/ML ~~LOC~~ SOLN
24.0000 [IU] | Freq: Every day | SUBCUTANEOUS | Status: DC
  Administered 2020-11-19 – 2020-11-20 (×2): 24 [IU] via SUBCUTANEOUS
  Filled 2020-11-19 (×5): qty 0.24

## 2020-11-19 MED ORDER — HYDRALAZINE HCL 20 MG/ML IJ SOLN
5.0000 mg | INTRAMUSCULAR | Status: DC | PRN
  Administered 2020-11-20: 5 mg via INTRAVENOUS
  Filled 2020-11-19: qty 1

## 2020-11-19 MED ORDER — CLOPIDOGREL BISULFATE 75 MG PO TABS
75.0000 mg | ORAL_TABLET | Freq: Every day | ORAL | Status: DC
  Administered 2020-11-20 – 2020-11-21 (×2): 75 mg via ORAL
  Filled 2020-11-19 (×2): qty 1

## 2020-11-19 MED ORDER — FENTANYL CITRATE (PF) 100 MCG/2ML IJ SOLN
25.0000 ug | INTRAMUSCULAR | Status: DC | PRN
  Administered 2020-11-19 – 2020-11-20 (×2): 25 ug via INTRAVENOUS
  Filled 2020-11-19 (×2): qty 2

## 2020-11-19 MED ORDER — ENOXAPARIN SODIUM 40 MG/0.4ML ~~LOC~~ SOLN
40.0000 mg | SUBCUTANEOUS | Status: DC
  Administered 2020-11-19 – 2020-11-20 (×2): 40 mg via SUBCUTANEOUS
  Filled 2020-11-19 (×2): qty 0.4

## 2020-11-19 MED ORDER — NITROGLYCERIN 2 % TD OINT
1.0000 [in_us] | TOPICAL_OINTMENT | Freq: Once | TRANSDERMAL | Status: AC
  Administered 2020-11-19: 1 [in_us] via TOPICAL
  Filled 2020-11-19: qty 1

## 2020-11-19 MED ORDER — ACETAMINOPHEN 650 MG RE SUPP: 650.0000 mg | Freq: Four times a day (QID) | RECTAL | Status: DC | PRN

## 2020-11-19 MED ORDER — FENTANYL CITRATE (PF) 100 MCG/2ML IJ SOLN
50.0000 ug | Freq: Once | INTRAMUSCULAR | Status: AC
  Administered 2020-11-19: 50 ug via INTRAVENOUS
  Filled 2020-11-19: qty 2

## 2020-11-19 MED ORDER — FUROSEMIDE 10 MG/ML IJ SOLN
40.0000 mg | Freq: Two times a day (BID) | INTRAMUSCULAR | Status: DC
Start: 2020-11-19 — End: 2020-11-19

## 2020-11-19 MED ORDER — FUROSEMIDE 10 MG/ML IJ SOLN
40.0000 mg | Freq: Once | INTRAMUSCULAR | Status: AC
  Administered 2020-11-19: 40 mg via INTRAVENOUS
  Filled 2020-11-19: qty 4

## 2020-11-19 MED ORDER — PANTOPRAZOLE SODIUM 40 MG PO TBEC
40.0000 mg | DELAYED_RELEASE_TABLET | Freq: Every day | ORAL | Status: DC
  Administered 2020-11-20 – 2020-11-21 (×2): 40 mg via ORAL
  Filled 2020-11-19 (×2): qty 1

## 2020-11-19 NOTE — ED Triage Notes (Signed)
Pt BIB [facility] c/o severe headache onset last night. 10/10 pain. Pt also c/o of nausea.

## 2020-11-19 NOTE — Consult Note (Addendum)
The patient has been seen in conjunction with Vin Bhagat, PAC. All aspects of care have been considered and discussed. The patient has been personally interviewed, examined, and all clinical data has been reviewed.   Post cardioversion acute on chronic diastolic heart failure contributed to by withholding of diuretic therapy, catecholamine surge related to cardioversion, and IV fluid administration during procedure yesterday.  IV diuresis to clear pulmonary edema.  Suggest 40 mg of Lasix IV every 8 hours x3 doses.  Follow basic metabolic panel.  Trend troponin I.  Resume typical diuretic therapy thereafter.  Patient is in normal sinus rhythm so pulmonary edema is not related to recurrent A. Fib.  Headache, right facial and right cranial of uncertain etiology.  CT scan is already been performed and does not demonstrate evidence of intracranial bleed.  Headache could be driving elevated blood pressure.  Elevation should improve with diuresis.  Recommend admission to hospitalist, diuresis, and evaluation for etiology of severe headache.  Doubt that headache is related to blood pressure.     Cardiology Consultation:   Patient ID: [name] MRN: [id number]; DOB: Mar 04, 1941  Admit date: 11/19/2020 Date of Consult: 11/19/2020  PCP:  Aretta Nip, MD   [place]  Cardiologist:  Candee Furbish, MD  Advanced Practice Provider:  No care team member to display Electrophysiologist:  Vickie Epley, MD   Patient Profile:   [name] is a 80 y.o. female with a hx of of paroxysmal atrial fibrillation and recurrent GIB requiring multiple transfusion now s/p Watchman implantation 07/01/20, CKD stage III, chronic diastolic CHF, DM, GERD, hiatal hernia, iron deficiency anemia who is being seen today for the evaluation of CHF at the request of Dr. Zenia Resides.  Patient with multiple admission for A. fib RVR and hypoxic respiratory failure secondary to diastolic  heart failure last year.  History of Present Illness:   [name] had a persistent atrial fibrillation with failed prior cardioversion.  She was eventually started on amiodarone and underwent successful outpatient TEE cardioversion yesterday. Her watchman appears well sealed.  No thrombus seen. Normal LVEF and small pericardiac effusion.  She was doing well up noted shortness of breath before going to bed.  She also had orthopnea.  Uncomfortable at night but wake up with severe left-sided headache in the morning.  Did not improve with Tylenol and EMS was called.  Also had vomiting.  Noted hypoxic suddenly in ER at oxygen saturation of 75%.  Improved to 98% on 6 L nasal cannula.  She was given IV Lasix 40 mg x 1 with almost 1 L diuresis.  Her breathing is improved.  She also got fentanyl, Zofran and nitroglycerin ointment.  She continues to have 10 out of 10 left-sided headache and some light sensitivity.  No weakness or slurred speech.  BNP 257 High-sensitivity troponin 14>>17 Leukocytosis at 14.6 Respiratory panel negative for COVID and influenza Chest X-ray FINDINGS: Diffuse interstitial opacity which is new. Mild cardiomegaly. Cardiac implant in the region of the left atrial appendage. No visible effusion. No pneumothorax. Spinal cord stimulator over the thoracic spine.  IMPRESSION: CHF pattern.  Past Medical History:  Diagnosis Date  . Acute GI bleeding 05/13/2015  . Anxiety   . Arthritis   . Asthma   . Atrial fibrillation ([place]) 10/29/2017  . Bakers cyst, left   . Chronic kidney disease    stage 3  . Constipation   . Diabetes mellitus    type 2  .  Dysrhythmia   . GERD (gastroesophageal reflux disease)   . Heart murmur   . History of cellulitis   . History of dizziness   . History of hiatal hernia    tiny 06/15/2016  . Hypertension   . Iron deficiency anemia   . Peripheral vascular disease ([place])   . PONV (postoperative nausea and vomiting)   . Pulmonary nodule      Past Surgical History:  Procedure Laterality Date  . APPENDECTOMY  1952  . BIOPSY  10/16/2018   Procedure: BIOPSY;  Surgeon: Clarene Essex, MD;  Location: [facility];  Service: Endoscopy;;  . BIOPSY  11/26/2019   Procedure: BIOPSY;  Surgeon: Clarene Essex, MD;  Location: [facility];  Service: Endoscopy;;  . BIOPSY  07/14/2020   Procedure: BIOPSY;  Surgeon: Ronald Lobo, MD;  Location: [facility];  Service: Endoscopy;;  . CARDIOVERSION N/A 09/09/2020   Procedure: CARDIOVERSION;  Surgeon: Josue Hector, MD;  Location: [facility] ENDOSCOPY;  Service: Cardiovascular;  Laterality: N/A;  . COLONOSCOPY W/ POLYPECTOMY  05/15/2013  . ESOPHAGOGASTRODUODENOSCOPY (EGD) WITH PROPOFOL N/A 06/15/2016   Procedure: ESOPHAGOGASTRODUODENOSCOPY (EGD) WITH PROPOFOL;  Surgeon: Clarene Essex, MD;  Location: [facility];  Service: Endoscopy;  Laterality: N/A;  . ESOPHAGOGASTRODUODENOSCOPY (EGD) WITH PROPOFOL N/A 10/16/2018   Procedure: ESOPHAGOGASTRODUODENOSCOPY (EGD) WITH PROPOFOL;  Surgeon: Clarene Essex, MD;  Location: [facility];  Service: Endoscopy;  Laterality: N/A;  . ESOPHAGOGASTRODUODENOSCOPY (EGD) WITH PROPOFOL N/A 11/26/2019   Procedure: ESOPHAGOGASTRODUODENOSCOPY (EGD) WITH PROPOFOL;  Surgeon: Clarene Essex, MD;  Location: [facility];  Service: Endoscopy;  Laterality: N/A;  . ESOPHAGOGASTRODUODENOSCOPY (EGD) WITH PROPOFOL N/A 07/14/2020   Procedure: ESOPHAGOGASTRODUODENOSCOPY (EGD) WITH PROPOFOL;  Surgeon: Ronald Lobo, MD;  Location: [facility];  Service: Endoscopy;  Laterality: N/A;  . EYE SURGERY Bilateral    cataracts removed  . GI RADIOFREQUENCY ABLATION  10/16/2018   Procedure: GI RADIOFREQUENCY ABLATION;  Surgeon: Clarene Essex, MD;  Location: [facility];  Service: Endoscopy;;  . GI RADIOFREQUENCY ABLATION N/A 11/26/2019   Procedure: GI RADIOFREQUENCY ABLATION;  Surgeon: Clarene Essex, MD;  Location: [facility];  Service: Endoscopy;  Laterality: N/A;  . HAND SURGERY Left 2010   operated on  index and ring finger  . HOT HEMOSTASIS N/A 07/14/2020   Procedure: HOT HEMOSTASIS (ARGON PLASMA COAGULATION/BICAP);  Surgeon: Ronald Lobo, MD;  Location: [facility];  Service: Endoscopy;  Laterality: N/A;  . INCONTINENCE SURGERY    . JOINT REPLACEMENT Right    knee  . KNEE ARTHROSCOPY  2003 2005   right knee  . KYPHOPLASTY N/A 01/29/2020   Procedure: T8 KYPHOPLASTY;  Surgeon: Melina Schools, MD;  Location: [place];  Service: Orthopedics;  Laterality: N/A;  60 mins Local with [facility]  . LEFT ATRIAL APPENDAGE OCCLUSION N/A 07/01/2020   Procedure: LEFT ATRIAL APPENDAGE OCCLUSION;  Surgeon: Vickie Epley, MD;  Location: [place] [facility];  Service: Cardiovascular;  Laterality: N/A;  . mass fallopian tube    . SPINAL CORD STIMULATOR BATTERY EXCHANGE N/A 09/09/2015   Procedure: SPINAL CORD STIMULATOR BATTERY EXCHANGE;  Surgeon: Melina Schools, MD;  Location: [place];  Service: Orthopedics;  Laterality: N/A;  . TEE WITHOUT CARDIOVERSION N/A 08/18/2020   Procedure: TRANSESOPHAGEAL ECHOCARDIOGRAM (TEE);  Surgeon: Josue Hector, MD;  Location: [facility] ENDOSCOPY;  Service: Cardiovascular;  Laterality: N/A;  . TEE WITHOUT CARDIOVERSION N/A 09/09/2020   Procedure: TRANSESOPHAGEAL ECHOCARDIOGRAM (TEE);  Surgeon: Josue Hector, MD;  Location: [facility] ENDOSCOPY;  Service: Cardiovascular;  Laterality: N/A;  . TOTAL KNEE ARTHROPLASTY Left 11/05/2017  Procedure: LEFT TOTAL KNEE ARTHROPLASTY;  Surgeon: Paralee Cancel, MD;  Location: [facility];  Service: Orthopedics;  Laterality: Left;  Adductor Block  . VAGINAL HYSTERECTOMY  1977   1 ovary removed     Inpatient Medications: Scheduled Meds: . amiodarone  200 mg Oral Daily  . fentaNYL (SUBLIMAZE) injection  50 mcg Intravenous Once  . metoprolol tartrate  100 mg Oral BID   Continuous Infusions:  PRN Meds:  Allergies:    Allergies  Allergen Reactions  . Vasotec Shortness Of Breath and Other (See Comments)    Wheezing, also  . Atorvastatin Other (See  Comments)    Leg cramps   . Codeine Nausea And Vomiting  . Cymbalta [Duloxetine Hcl] Itching  . Lansoprazole Itching, Swelling, Rash and Other (See Comments)    Redness, swelling of mouth. During 07/12/20-07/15/20 pt tolerated IV protonix infusion with no reactions.  . Morphine And Related Itching  . Sulfate Itching  . Amitriptyline Hcl Other (See Comments)     sleepwalking  . Escitalopram Oxalate Itching  . Iron Other (See Comments)    constipated  . Sertraline Hcl Itching  . Tramadol Itching  . Adhesive [Tape] Rash  . Allevyn Adhesive [Wound Dressings] Rash  . Scopolamine Rash    Social History:   Social History   Socioeconomic History  . Marital status: Married    Spouse name: Charlotte Crumb  . Number of children: 2  . Years of education: 77  . Highest education level: Not on file  Occupational History    Comment: retired Pharmacist, hospital, IT sales professional  Tobacco Use  . Smoking status: Never Smoker  . Smokeless tobacco: Never Used  Vaping Use  . Vaping Use: Never used  Substance and Sexual Activity  . Alcohol use: No  . Drug use: No  . Sexual activity: Never    Birth control/protection: Surgical  Other Topics Concern  . Not on file  Social History Narrative   Lives with husband   Caffeine use- none   Social Determinants of Health   Financial Resource Strain: Not on file  Food Insecurity: Not on file  Transportation Needs: Not on file  Physical Activity: Not on file  Stress: Not on file  Social Connections: Not on file  Intimate Partner Violence: Not on file    Family History:    Family History  Problem Relation Age of Onset  . Lung cancer Mother   . Heart attack Father   . Colon cancer Neg Hx   . Stroke Neg Hx   . Migraines Neg Hx      ROS:  Please see the history of present illness.  All other ROS reviewed and negative.     Physical Exam/Data:   Vitals:   11/19/20 1030 11/19/20 1236 11/19/20 1345 11/19/20 1415  BP: (!) 174/100 (!) 181/76 (!) 180/76 (!)  152/78  Pulse: 81 71 71 73  Resp: (!) 33 (!) 22 (!) 22 20  Temp:      TempSrc:      SpO2: 97% 100% 100% 100%   No intake or output data in the 24 hours ending 11/19/20 1431 Last 3 Weights 11/18/2020 11/09/2020 10/12/2020  Weight (lbs) 147 lb 145 lb 148 lb  Weight (kg) 66.679 kg 65.772 kg 67.132 kg     There is no height or weight on file to calculate BMI.  General:  Well nourished, well developed, in no acute distress HEENT: normal Lymph: no adenopathy Neck: no JVD Endocrine:  No thryomegaly Vascular: No carotid  bruits; FA pulses 2+ bilaterally without bruits  Cardiac:  normal S1, S2; RRR; no murmur  Lungs: Faint basilar rails with diminished breath sounds  abd: soft, nontender, no hepatomegaly  Ext: no edema Musculoskeletal:  No deformities, BUE and BLE strength normal and equal Skin: warm and dry  Neuro:  no focal abnormalities noted Psych:  Normal affect   EKG:  The EKG was personally reviewed and demonstrates: Sinus rhythm at rate of 83 bpm Telemetry:  Telemetry was personally reviewed and demonstrates: Normal sinus rhythm  Relevant CV Studies:  TEE / DCCV 11/18/20 IMPRESSIONS    1. Left ventricular ejection fraction, by estimation, is 55 to 60%. The  left ventricle has normal function.  2. Right ventricular systolic function is normal. The right ventricular  size is normal. There is mildly elevated pulmonary artery systolic  pressure.  3. A small pericardial effusion is present.  4. Right atrial size was moderately dilated.  5. The mitral valve is normal in structure. Mild mitral valve  regurgitation.  6. The aortic valve is tricuspid. Aortic valve regurgitation is not  visualized.  7. Tricuspid valve regurgitation is moderate.  8. S/p Watchman LAA occlusion device, appears well sealed. No thrombus  seen. Left atrial size was moderately dilated.  CARDIOVERSION:     Indications:  Symptomatic Atrial Fibrillation  Procedure Details:  Once the TEE was  complete, the patient had the defibrillator pads placed in the anterior and posterior position. Once an appropriate level of sedation was confirmed, the patient was cardioverted x 1 with 200J of biphasic synchronized energy.  The patient converted to NSR.  There were no apparent complications.  The patient had normal neuro status and respiratory status post procedure with vitals stable as recorded elsewhere.  Adequate airway was maintained throughout and vital signs monitored per protocol.   Laboratory Data:  High Sensitivity Troponin:   Recent Labs  Lab 11/19/20 0955 11/19/20 1325  TROPONINIHS 14 17     Chemistry Recent Labs  Lab 11/19/20 0955 11/19/20 1042  NA 139 138  K 4.3 4.3  CL 102  --   CO2 29  --   GLUCOSE 170*  --   BUN 21  --   CREATININE 0.93  --   CALCIUM 9.6  --   GFRNONAA >60  --   ANIONGAP 8  --     Recent Labs  Lab 11/19/20 0955  PROT 6.9  ALBUMIN 4.0  AST 22  ALT 28  ALKPHOS 78  BILITOT 0.8   Hematology Recent Labs  Lab 11/19/20 0955 11/19/20 1042  WBC 14.6*  --   RBC 5.31*  --   HGB 14.4 15.3*  HCT 47.5* 45.0  MCV 89.5  --   MCH 27.1  --   MCHC 30.3  --   RDW 15.8*  --   PLT 233  --    BNP Recent Labs  Lab 11/19/20 0955  BNP 257.1*    Radiology/Studies:  DG Chest 1 View  Result Date: 11/19/2020 CLINICAL DATA:  Shortness of breath EXAM: CHEST  1 VIEW COMPARISON:  07/24/2020 FINDINGS: Diffuse interstitial opacity which is new. Mild cardiomegaly. Cardiac implant in the region of the left atrial appendage. No visible effusion. No pneumothorax. Spinal cord stimulator over the thoracic spine. IMPRESSION: CHF pattern. Electronically Signed   By: Monte Fantasia M.D.   On: 11/19/2020 10:40   CT Head Wo Contrast  Result Date: 11/19/2020 CLINICAL DATA:  Headache today. EXAM: CT HEAD WITHOUT CONTRAST TECHNIQUE: Contiguous  axial images were obtained from the base of the skull through the vertex without intravenous contrast. COMPARISON:  CT  head 02/22/2016. FINDINGS: Brain: No evidence of acute infarction, hemorrhage, hydrocephalus, extra-axial collection or mass lesion/mass effect. Chronic microvascular ischemic change appears somewhat worse than on the prior exam. Cortical atrophy is stable in appearance. Vascular: No hyperdense vessel or unexpected calcification. Skull: Intact.  No focal lesion. Sinuses/Orbits: Status post cataract surgery. Mild mucosal thickening along the lateral aspect of the left maxillary sinus noted. Other: None. IMPRESSION: No acute abnormality. Some progression of chronic microvascular ischemic change. Cortical atrophy is stable in appearance. Mild mucosal thickening left maxillary sinus. Electronically Signed   By: Inge Rise M.D.   On: 11/19/2020 10:33   ECHO TEE  Result Date: 11/18/2020    TRANSESOPHOGEAL ECHO REPORT   Patient Name:   DMIYA MALPHRUS Date of Exam: 11/18/2020 Medical Rec #:  [id number]        Height:       64.0 in Accession #:    [id number]       Weight:       145.0 lb Date of Birth:  September 21, 1940         BSA:          1.706 m Patient Age:    34 years         BP:           160/54 mmHg Patient Gender: F                HR:           66 bpm. Exam Location:  Outpatient Procedure: Transesophageal Echo, 3D Echo, Cardiac Doppler and Color Doppler Indications:     I48.0 Paroxysmal atrial fibrillation  History:         Patient has prior history of Echocardiogram examinations, most                  recent 09/09/2020. Signs/Symptoms:Murmur; Risk                  Factors:Hypertension and Diabetes. WATCHMAN FLX 27 07/01/2020.                  Chronic kidney disease.  Sonographer:     Darlina Sicilian RDCS Referring Phys:  [id number] [place] Diagnosing Phys: Oswaldo Milian MD PROCEDURE: After discussion of the risks and benefits of a TEE, an informed consent was obtained from the patient. The transesophogeal probe was passed without difficulty through the esophogus of the patient. Sedation performed  by different physician. The patient was monitored while under deep sedation. Anesthestetic sedation was provided intravenously by Anesthesiology: 167.39mg  of Propofol, 60mg  of Lidocaine. Image quality was good. The patient's vital signs; including heart rate, blood pressure, and oxygen saturation; remained stable throughout the procedure. The patient developed no complications during the procedure. A successful direct current cardioversion was performed at 200 joules with 1 attempt. IMPRESSIONS  1. Left ventricular ejection fraction, by estimation, is 55 to 60%. The left ventricle has normal function.  2. Right ventricular systolic function is normal. The right ventricular size is normal. There is mildly elevated pulmonary artery systolic pressure.  3. A small pericardial effusion is present.  4. Right atrial size was moderately dilated.  5. The mitral valve is normal in structure. Mild mitral valve regurgitation.  6. The aortic valve is tricuspid. Aortic valve regurgitation is not visualized.  7. Tricuspid valve regurgitation is moderate.  8. S/p Watchman  LAA occlusion device, appears well sealed. No thrombus seen. Left atrial size was moderately dilated. FINDINGS  Left Ventricle: Left ventricular ejection fraction, by estimation, is 55 to 60%. The left ventricle has normal function. The left ventricular internal cavity size was normal in size. Right Ventricle: The right ventricular size is normal. No increase in right ventricular wall thickness. Right ventricular systolic function is normal. There is mildly elevated pulmonary artery systolic pressure. Left Atrium: S/p Watchman LAA occlusion device, appears well sealed. No thrombus seen. Left atrial size was moderately dilated. No left atrial/left atrial appendage thrombus was detected. Right Atrium: Right atrial size was moderately dilated. Pericardium: A small pericardial effusion is present. Mitral Valve: The mitral valve is normal in structure. Mild mitral  valve regurgitation. Tricuspid Valve: The tricuspid valve is normal in structure. Tricuspid valve regurgitation is moderate. Aortic Valve: The aortic valve is tricuspid. Aortic valve regurgitation is not visualized. Pulmonic Valve: The pulmonic valve was grossly normal. Pulmonic valve regurgitation is not visualized. Aorta: The aortic root and ascending aorta are structurally normal, with no evidence of dilitation. IAS/Shunts: No atrial level shunt detected by color flow Doppler. Oswaldo Milian MD Electronically signed by Oswaldo Milian MD Signature Date/Time: 11/18/2020/3:48:25 PM    Final      Assessment and Plan:   1. Acute hypoxic respiratory failure due to possible acute on chronic diastolic heart failure - Patient was doing well before going to bed and then she developed shortness of breath and orthopnea.  BNP 157.  Chest x-ray with CHF pattern.  -Likely due to acute pulmonary edema of unknown etiology. However, BP is elevated  -Patient has leukocytosis -No edema on her legs.  Report normal diet last night.  Symptoms is not due to tachycardia/arrhythmia. -Breathing improved with oxygen and IV Lasix -Continue IV Lasix>> give another dose this evening   2.  Paroxysmal atrial fibrillation -Underwent successful TEE cardioversion yesterday -Maintaining sinus rhythm -Not on anticoagulation due to recurrent GI bleed now on watchman  3. S/p Watchman device -Well-seated watchman device on TEE yesterday  4.  Hypertensive urgency -Questionable causing her to have pulmonary edema and shortness of breath -Resume home metoprolol tartrate 100 mg twice daily -If still elevated blood pressure add amlodipine or ARB  5.  Left-sided headache -Recommended IM admission and work-up  Risk Assessment/Risk Scores:    New York Heart Association (NYHA) Functional Class NYHA Class III  CHA2DS2-VASc Score = 6  This indicates a 9.7% annual risk of stroke. The patient's score is based  upon: CHF History: Yes HTN History: Yes Diabetes History: Yes Stroke History: No Vascular Disease History: No Age Score: 2 Gender Score: 1    For questions or updates, please contact [place] Please consult [email or web address] for contact info under    Jarrett Soho, [place]  11/19/2020 2:31 PM

## 2020-11-19 NOTE — ED Notes (Signed)
Pt's oxygen saturation dropped to 75% with good wave form on RA. Pt placed on 2L [place]. Pt only improved to 80%. Pt's oxygen increased to 4L, pt still sating 88%. Pt increased to 6L [place] and pt now sating 98%. MD notified. Will continue to monitor.

## 2020-11-19 NOTE — ED Provider Notes (Signed)
I provided a substantive portion of the care of this patient.  I personally performed the entirety of the medical decision making for this encounter.     80 year old female presents with cute onset of headache which is now developed into vomiting.  Patient had ablation for A. fib yesterday.  On exam she is actively vomiting.  Concern for possible intracranial bleed.  Patient is also short of breath and does have a history of respiratory failure the past.  Patient to have head CT emergently, have her nausea treated, and likely admission    Lacretia Leigh, MD 11/19/20 [phone]

## 2020-11-19 NOTE — Progress Notes (Signed)
ABG results given to Lacretia Leigh, MD. No new orders at this time.

## 2020-11-19 NOTE — ED Provider Notes (Signed)
[facility] EMERGENCY DEPARTMENT Provider Note   CSN: [id number] Arrival date & time: 11/19/20  0930     History Chief Complaint  Patient presents with  . Headache    [name] is a 80 y.o. female who presents with severe h/a.She has a past medical history of anxiety, asthma, diabetes, hypertension, A. fib and CHF.  She is status TEE cardioversion yesterday with Dr. Gayla Doss. .  History is gathered from review of EMR, patient, EMS and husband at bedside.  Husband reports that patient awoke in the middle of the night complaining of a severe headache.  He checked her oxygen level and it was noted to be in the 70s so he placed her on 1-1/2 L of oxygen which she has at home to use as needed.  She was able to get back to sleep but when she woke up complained that the right side of her head was "killing her."  He states that she does not have a history of migraines or regular headaches.  He checked her blood pressure and her systolic pressure was over 734 with diastolic over 193 at which point he called EMS. .  Patient states that theheadache is R sided, 10/10 patient she has never had anything like this before.  Pain does not radiate.  EMS reported some mild right sided weakness however she states this is chronic for her.  She denies changes in vision, hearing, speech.  She denies fevers, chills, chest pain, urinary symptoms, abdominal pain.  Patient began actively vomiting during assessment.    EMS noted hypoxia prior to arrival and she was placed on nasal cannula however here she has oxygen saturations 99% on room air but does acknowledge she feels very short of breath.  HPI     Past Medical History:  Diagnosis Date  . Acute GI bleeding 05/13/2015  . Anxiety   . Arthritis   . Asthma   . Atrial fibrillation ([place]) 10/29/2017  . Bakers cyst, left   . Chronic kidney disease    stage 3  . Constipation   . Diabetes mellitus    type 2  . Dysrhythmia   . GERD  (gastroesophageal reflux disease)   . Heart murmur   . History of cellulitis   . History of dizziness   . History of hiatal hernia    tiny 06/15/2016  . Hypertension   . Iron deficiency anemia   . Peripheral vascular disease ([place])   . PONV (postoperative nausea and vomiting)   . Pulmonary nodule     Patient Active Problem List   Diagnosis Date Noted  . Hypertensive crisis 11/19/2020  . Abnormality of left atrial appendage 09/06/2020  . Anxiety 09/06/2020  . 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 Atherosclerotic heart disease of native coronary artery without angina pectoris 09/06/2020  . Chronic kidney disease, stage 3 unspecified ([place]) 09/06/2020  . Congestive heart failure ([place]) 09/06/2020  . Diabetic retinopathy associated with type 2 diabetes mellitus ([place]) 09/06/2020  . Gastroesophageal reflux disease with esophagitis 09/06/2020  . Hypomagnesemia 09/06/2020  . Infectious colitis, enteritis and gastroenteritis 09/06/2020  . Insomnia 09/06/2020  . Long term (current) use of insulin ([place]) 09/06/2020  . Other specified disorders of bone density and structure, other site 09/06/2020  . Peripheral venous insufficiency 09/06/2020  . Personal history of colonic polyps 09/06/2020  . Personal history of other diseases of the digestive system 09/06/2020  . Presence of Watchman left atrial appendage  closure device 09/06/2020  . Pulmonary nodule 09/06/2020  . Recurrent falls 09/06/2020  . Recurrent major depression ([place]) 09/06/2020  . Slow transit constipation 09/06/2020  . Upper abdominal pain 09/06/2020  . Secondary hypercoagulable state ([place]) 08/05/2020  . Acute on chronic respiratory failure with hypoxemia ([place]) 07/22/2020  . Pleural effusion 07/22/2020  . SOB (shortness of breath) 07/12/2020  . ABLA (acute blood loss anemia) 07/12/2020  . Persistent atrial fibrillation ([place]) 07/01/2020  . Paroxysmal atrial fibrillation ([place]) 04/14/2020  .  Thoracic back pain 12/16/2019  . Muscle weakness 05/01/2018  . Impairment of balance 04/25/2018  . S/P left TKA 11/05/2017  . S/P total knee replacement 11/05/2017  . Osteoarthritis 09/06/2017  . Pain in left knee 09/06/2017  . Diabetes mellitus ([place]) 12/25/2016  . Hypercholesterolemia 12/25/2016  . Ptosis of eyelid 10/09/2016  . Iron deficiency anemia 12/15/2015  . Acute GI bleeding 05/13/2015  . Acute blood loss anemia 05/13/2015  . Normocytic anemia 05/13/2015  . Allergic rhinitis 04/06/2015  . Asthma, chronic 09/08/2013  . Hypoxia 09/08/2013  . HTN (hypertension) 09/08/2013  . Depression 09/08/2013  . DM type 2 with diabetic peripheral neuropathy ([place]) 09/08/2013  . Polyneuropathy due to type 2 diabetes mellitus ([place]) 09/08/2013  . Flu-like symptoms 09/07/2013    Past Surgical History:  Procedure Laterality Date  . APPENDECTOMY  1952  . BIOPSY  10/16/2018   Procedure: BIOPSY;  Surgeon: Clarene Essex, MD;  Location: [facility];  Service: Endoscopy;;  . BIOPSY  11/26/2019   Procedure: BIOPSY;  Surgeon: Clarene Essex, MD;  Location: [facility];  Service: Endoscopy;;  . BIOPSY  07/14/2020   Procedure: BIOPSY;  Surgeon: Ronald Lobo, MD;  Location: [facility];  Service: Endoscopy;;  . CARDIOVERSION N/A 09/09/2020   Procedure: CARDIOVERSION;  Surgeon: Josue Hector, MD;  Location: [facility] ENDOSCOPY;  Service: Cardiovascular;  Laterality: N/A;  . COLONOSCOPY W/ POLYPECTOMY  05/15/2013  . ESOPHAGOGASTRODUODENOSCOPY (EGD) WITH PROPOFOL N/A 06/15/2016   Procedure: ESOPHAGOGASTRODUODENOSCOPY (EGD) WITH PROPOFOL;  Surgeon: Clarene Essex, MD;  Location: [facility];  Service: Endoscopy;  Laterality: N/A;  . ESOPHAGOGASTRODUODENOSCOPY (EGD) WITH PROPOFOL N/A 10/16/2018   Procedure: ESOPHAGOGASTRODUODENOSCOPY (EGD) WITH PROPOFOL;  Surgeon: Clarene Essex, MD;  Location: [facility];  Service: Endoscopy;  Laterality: N/A;  . ESOPHAGOGASTRODUODENOSCOPY (EGD) WITH PROPOFOL N/A 11/26/2019    Procedure: ESOPHAGOGASTRODUODENOSCOPY (EGD) WITH PROPOFOL;  Surgeon: Clarene Essex, MD;  Location: [facility];  Service: Endoscopy;  Laterality: N/A;  . ESOPHAGOGASTRODUODENOSCOPY (EGD) WITH PROPOFOL N/A 07/14/2020   Procedure: ESOPHAGOGASTRODUODENOSCOPY (EGD) WITH PROPOFOL;  Surgeon: Ronald Lobo, MD;  Location: [facility];  Service: Endoscopy;  Laterality: N/A;  . EYE SURGERY Bilateral    cataracts removed  . GI RADIOFREQUENCY ABLATION  10/16/2018   Procedure: GI RADIOFREQUENCY ABLATION;  Surgeon: Clarene Essex, MD;  Location: [facility];  Service: Endoscopy;;  . GI RADIOFREQUENCY ABLATION N/A 11/26/2019   Procedure: GI RADIOFREQUENCY ABLATION;  Surgeon: Clarene Essex, MD;  Location: [facility];  Service: Endoscopy;  Laterality: N/A;  . HAND SURGERY Left 2010   operated on index and ring finger  . HOT HEMOSTASIS N/A 07/14/2020   Procedure: HOT HEMOSTASIS (ARGON PLASMA COAGULATION/BICAP);  Surgeon: Ronald Lobo, MD;  Location: [facility];  Service: Endoscopy;  Laterality: N/A;  . INCONTINENCE SURGERY    . JOINT REPLACEMENT Right    knee  . KNEE ARTHROSCOPY  2003 2005   right knee  . KYPHOPLASTY N/A 01/29/2020   Procedure: T8 KYPHOPLASTY;  Surgeon: Melina Schools, MD;  Location: [facility];  Service:  Orthopedics;  Laterality: N/A;  60 mins Local with [facility]  . LEFT ATRIAL APPENDAGE OCCLUSION N/A 07/01/2020   Procedure: LEFT ATRIAL APPENDAGE OCCLUSION;  Surgeon: Vickie Epley, MD;  Location: [facility];  Service: Cardiovascular;  Laterality: N/A;  . mass fallopian tube    . SPINAL CORD STIMULATOR BATTERY EXCHANGE N/A 09/09/2015   Procedure: SPINAL CORD STIMULATOR BATTERY EXCHANGE;  Surgeon: Melina Schools, MD;  Location: [facility];  Service: Orthopedics;  Laterality: N/A;  . TEE WITHOUT CARDIOVERSION N/A 08/18/2020   Procedure: TRANSESOPHAGEAL ECHOCARDIOGRAM (TEE);  Surgeon: Josue Hector, MD;  Location: [facility] END[facility]COPY;  Service: Cardiovascular;  Laterality: N/A;  . TEE WITHOUT  CARDIOVERSION N/A 09/09/2020   Procedure: TRANSESOPHAGEAL ECHOCARDIOGRAM (TEE);  Surgeon: Josue Hector, MD;  Location: [facility] ENDOSCOPY;  Service: Cardiovascular;  Laterality: N/A;  . TOTAL KNEE ARTHROPLASTY Left 11/05/2017   Procedure: LEFT TOTAL KNEE ARTHROPLASTY;  Surgeon: Paralee Cancel, MD;  Location: [facility];  Service: Orthopedics;  Laterality: Left;  Adductor Block  . VAGINAL HYSTERECTOMY  1977   1 ovary removed     OB History   No obstetric history on file.     Family History  Problem Relation Age of Onset  . Lung cancer Mother   . Heart attack Father   . Colon cancer Neg Hx   . Stroke Neg Hx   . Migraines Neg Hx     Social History   Tobacco Use  . Smoking status: Never Smoker  . Smokeless tobacco: Never Used  Vaping Use  . Vaping Use: Never used  Substance Use Topics  . Alcohol use: No  . Drug use: No    Home Medications Prior to Admission medications   Medication Sig Start Date End Date Taking? Authorizing Provider  acetaminophen (TYLENOL) 500 MG tablet Take 1,000 mg by mouth every 8 (eight) hours as needed for mild pain, moderate pain or headache.    Yes [provider]  albuterol (PROAIR HFA) 108 (90 BASE) MCG/ACT inhaler Inhale 2 puffs into the lungs every 4 (four) hours as needed for wheezing or shortness of breath. 02/02/15  Yes Collene Gobble, MD  ALPRAZolam Duanne Moron) 0.25 MG tablet Take 0.25 mg by mouth at bedtime.  03/25/20  Yes [provider]  amiodarone (PACERONE) 200 MG tablet Take 200 mg by mouth daily.   Yes [provider]  clopidogrel (PLAVIX) 75 MG tablet Take 75 mg by mouth daily.   Yes [provider]  Continuous Blood Gluc Sensor (FREESTYLE LIBRE 14 DAY SENSOR) MISC Inject 1 patch into the skin every 14 (fourteen) days.   Yes [provider]  diphenhydrAMINE (BENADRYL) 25 MG tablet Take 25 mg by mouth daily as needed for allergies.   Yes [provider]  docusate sodium (COLACE) 100 MG capsule Take  100 mg by mouth daily as needed for moderate constipation.   Yes [provider]  DROPLET PEN NEEDLES 32G X 4 MM MISC  04/19/20  Yes [provider]  fluticasone (FLONASE) 50 MCG/ACT nasal spray Place 1 spray into both nostrils daily as needed for allergies.    Yes [provider]  furosemide (LASIX) 40 MG tablet Take 1 tablet (40 mg total) by mouth daily. 08/31/20  Yes Vickie Epley, MD  gabapentin (NEURONTIN) 300 MG capsule Take 600 mg by mouth in the morning, at noon, and at bedtime.    Yes [provider]  insulin glargine (LANTUS) 100 UNIT/ML Solostar Pen Inject 24 Units  into the skin at bedtime.   Yes [provider]  Iron-FA-B Cmp-C-Biot-Probiotic (FUSION PLUS) CAPS Take 1 capsule by mouth every evening.  11/19/19  Yes [provider]  magnesium oxide (MAG-OX) 400 (241.3 Mg) MG tablet Take 0.5 tablets (200 mg total) by mouth 2 (two) times daily. 08/31/20  Yes Vickie Epley, MD  metoprolol tartrate (LOPRESSOR) 100 MG tablet Take 1 tablet (100 mg total) by mouth 2 (two) times daily. 07/26/20  Yes Regalado, Belkys A, MD  Multiple Vitamins-Minerals (PRESERVISION AREDS 2 PO) Take 1 capsule by mouth 2 (two) times daily.    Yes [provider]  NOVOLOG FLEXPEN 100 UNIT/ML FlexPen Inject 8-12 Units into the skin See admin instructions. Inject 8-12 units into the skin, PER SLIDING SCALE, in the morning, noon, and bedtime 09/04/19  Yes [provider]  OXYGEN Inhale 2.5 L/min into the lungs as needed (for shortness of breath).   Yes [provider]  pantoprazole (PROTONIX) 40 MG tablet Take 40 mg by mouth daily.   Yes [provider]  Pitavastatin Calcium (LIVALO) 1 MG TABS Take 1 mg by mouth at bedtime.   Yes [provider]  potassium chloride SA (KLOR-CON) 20 MEQ tablet Take 2 tablets (40 mEq total) by mouth daily. 08/31/20  Yes Vickie Epley, MD  sodium chloride (OCEAN) 0.65 % SOLN nasal spray  Place 1 spray into both nostrils as needed for congestion.   Yes [provider]  venlafaxine (EFFEXOR) 37.5 MG tablet Take 37.5 mg by mouth 2 (two) times daily. 10/27/19  Yes [provider]    Allergies    Vasotec, Atorvastatin, Codeine, Cymbalta [duloxetine hcl], Lansoprazole, Morphine and related, Sulfate, Amitriptyline hcl, Escitalopram oxalate, Iron, Sertraline hcl, Tramadol, Adhesive [tape], Allevyn adhesive [wound dressings], and Scopolamine  Review of Systems   Review of Systems  Unable to perform ROS: Acuity of condition    Physical Exam Updated Vital Signs BP (!) 152/78   Pulse 73   Temp 98.5 F (36.9 C) (Oral)   Resp 20   SpO2 100%   Physical Exam Vitals reviewed.  Constitutional:      Comments: Exam limited by acuity of condition, active vomiting inability of the patient to participate in exam and questioning.  HENT:     Head: Normocephalic and atraumatic.  Eyes:     Extraocular Movements: Extraocular movements intact.     Comments: Pupils miotic  Neck:     Vascular: No JVD.  Cardiovascular:     Rate and Rhythm: Normal rate.  Pulmonary:     Effort: Tachypnea and prolonged expiration present.     Comments: Air movement is poor with prolonged expiratory phase, belly breathing. Musculoskeletal:     Cervical back: Normal range of motion.     Right lower leg: No edema.     Left lower leg: No edema.  Skin:    General: Skin is warm and dry.  Neurological:     General: No focal deficit present.     Mental Status: She is alert.     Cranial Nerves: No dysarthria.     Sensory: No sensory deficit.     Motor: No tremor or seizure activity.     Comments: Normal strength at the ankles     ED Results / Procedures / Treatments   Labs (all labs ordered are listed, but only abnormal results are displayed) Labs Reviewed  COMPREHENSIVE METABOLIC PANEL - Abnormal; Notable for the following components:  Result Value   Glucose, Bld 170 (*)    All  other components within normal limits  CBC WITH DIFFERENTIAL/PLATELET - Abnormal; Notable for the following components:   WBC 14.6 (*)    RBC 5.31 (*)    HCT 47.5 (*)    RDW 15.8 (*)    Neutro Abs 12.1 (*)    All other components within normal limits  BRAIN NATRIURETIC PEPTIDE - Abnormal; Notable for the following components:   B Natriuretic Peptide 257.1 (*)    All other components within normal limits  I-STAT ARTERIAL BLOOD GAS, ED - Abnormal; Notable for the following components:   pH, Arterial 7.271 (*)    pCO2 arterial 72.7 (*)    pO2, Arterial 159 (*)    Bicarbonate 33.5 (*)    TCO2 36 (*)    Acid-Base Excess 4.0 (*)    Hemoglobin 15.3 (*)    All other components within normal limits  RESP PANEL BY RT-PCR (FLU A&B, COVID) ARPGX2  LIPASE, BLOOD  MAGNESIUM  URINALYSIS, ROUTINE W REFLEX MICROSCOPIC  TROPONIN I (HIGH SENSITIVITY)  TROPONIN I (HIGH SENSITIVITY)    EKG EKG Interpretation  Date/Time:  Friday November 19 2020 09:53:39 EDT Ventricular Rate:  83 PR Interval:    QRS Duration: 92 QT Interval:  385 QTC Calculation: 453 R Axis:   68 Text Interpretation: Sinus rhythm Prolonged PR interval Confirmed by Lacretia Leigh ([id number]) on 11/19/2020 10:24:30 AM   Radiology DG Chest 1 View  Result Date: 11/19/2020 CLINICAL DATA:  Shortness of breath EXAM: CHEST  1 VIEW COMPARISON:  07/24/2020 FINDINGS: Diffuse interstitial opacity which is new. Mild cardiomegaly. Cardiac implant in the region of the left atrial appendage. No visible effusion. No pneumothorax. Spinal cord stimulator over the thoracic spine. IMPRESSION: CHF pattern. Electronically Signed   By: Monte Fantasia M.D.   On: 11/19/2020 10:40   CT Head Wo Contrast  Result Date: 11/19/2020 CLINICAL DATA:  Headache today. EXAM: CT HEAD WITHOUT CONTRAST TECHNIQUE: Contiguous axial images were obtained from the base of the skull through the vertex without intravenous contrast. COMPARISON:  CT head 02/22/2016. FINDINGS:  Brain: No evidence of acute infarction, hemorrhage, hydrocephalus, extra-axial collection or mass lesion/mass effect. Chronic microvascular ischemic change appears somewhat worse than on the prior exam. Cortical atrophy is stable in appearance. Vascular: No hyperdense vessel or unexpected calcification. Skull: Intact.  No focal lesion. Sinuses/Orbits: Status post cataract surgery. Mild mucosal thickening along the lateral aspect of the left maxillary sinus noted. Other: None. IMPRESSION: No acute abnormality. Some progression of chronic microvascular ischemic change. Cortical atrophy is stable in appearance. Mild mucosal thickening left maxillary sinus. Electronically Signed   By: Inge Rise M.D.   On: 11/19/2020 10:33   ECHO TEE  Result Date: 11/18/2020    TRANSESOPHOGEAL ECHO REPORT   Patient Name:   [name] Date of Exam: 11/18/2020 Medical Rec #:  [id number]        Height:       64.0 in Accession #:    [id number]       Weight:       145.0 lb Date of Birth:  04-19-1941         BSA:          1.706 m Patient Age:    54 years         BP:           160/54 mmHg Patient Gender: F  HR:           66 bpm. Exam Location:  Outpatient Procedure: Transesophageal Echo, 3D Echo, Cardiac Doppler and Color Doppler Indications:     I48.0 Paroxysmal atrial fibrillation  History:         Patient has prior history of Echocardiogram examinations, most                  recent 09/09/2020. Signs/Symptoms:Murmur; Risk                  Factors:Hypertension and Diabetes. WATCHMAN FLX 27 07/01/2020.                  Chronic kidney disease.  Sonographer:     Darlina Sicilian RDCS Referring Phys:  [id number] [place] Diagnosing Phys: Oswaldo Milian MD PROCEDURE: After discussion of the risks and benefits of a TEE, an informed consent was obtained from the patient. The transesophogeal probe was passed without difficulty through the esophogus of the patient. Sedation performed by different physician.  The patient was monitored while under deep sedation. Anesthestetic sedation was provided intravenously by Anesthesiology: 167.39mg  of Propofol, 60mg  of Lidocaine. Image quality was good. The patient's vital signs; including heart rate, blood pressure, and oxygen saturation; remained stable throughout the procedure. The patient developed no complications during the procedure. A successful direct current cardioversion was performed at 200 joules with 1 attempt. IMPRESSIONS  1. Left ventricular ejection fraction, by estimation, is 55 to 60%. The left ventricle has normal function.  2. Right ventricular systolic function is normal. The right ventricular size is normal. There is mildly elevated pulmonary artery systolic pressure.  3. A small pericardial effusion is present.  4. Right atrial size was moderately dilated.  5. The mitral valve is normal in structure. Mild mitral valve regurgitation.  6. The aortic valve is tricuspid. Aortic valve regurgitation is not visualized.  7. Tricuspid valve regurgitation is moderate.  8. S/p Watchman LAA occlusion device, appears well sealed. No thrombus seen. Left atrial size was moderately dilated. FINDINGS  Left Ventricle: Left ventricular ejection fraction, by estimation, is 55 to 60%. The left ventricle has normal function. The left ventricular internal cavity size was normal in size. Right Ventricle: The right ventricular size is normal. No increase in right ventricular wall thickness. Right ventricular systolic function is normal. There is mildly elevated pulmonary artery systolic pressure. Left Atrium: S/p Watchman LAA occlusion device, appears well sealed. No thrombus seen. Left atrial size was moderately dilated. No left atrial/left atrial appendage thrombus was detected. Right Atrium: Right atrial size was moderately dilated. Pericardium: A small pericardial effusion is present. Mitral Valve: The mitral valve is normal in structure. Mild mitral valve regurgitation.  Tricuspid Valve: The tricuspid valve is normal in structure. Tricuspid valve regurgitation is moderate. Aortic Valve: The aortic valve is tricuspid. Aortic valve regurgitation is not visualized. Pulmonic Valve: The pulmonic valve was grossly normal. Pulmonic valve regurgitation is not visualized. Aorta: The aortic root and ascending aorta are structurally normal, with no evidence of dilitation. IAS/Shunts: No atrial level shunt detected by color flow Doppler. Oswaldo Milian MD Electronically signed by Oswaldo Milian MD Signature Date/Time: 11/18/2020/3:48:25 PM    Final     Procedures .Critical Care Performed by: Margarita Mail, PA-C Authorized by: Margarita Mail, PA-C   Critical care provider statement:    Critical care time (minutes):  50   Critical care time was exclusive of:  Separately billable procedures and treating other patients   Critical care  was necessary to treat or prevent imminent or life-threatening deterioration of the following conditions:  Respiratory failure   Critical care was time spent personally by me on the following activities:  Discussions with consultants, evaluation of patient's response to treatment, examination of patient, ordering and performing treatments and interventions, ordering and review of laboratory studies, ordering and review of radiographic studies, pulse oximetry, re-evaluation of patient's condition, obtaining history from patient or surrogate and review of old charts     Medications Ordered in ED Medications  amiodarone (PACERONE) tablet 200 mg (has no administration in time range)  metoprolol tartrate (LOPRESSOR) tablet 100 mg (has no administration in time range)  fentaNYL (SUBLIMAZE) injection 50 mcg (has no administration in time range)  ondansetron (ZOFRAN) 4 MG/2ML injection (4 mg  Given 11/19/20 0950)  fentaNYL (SUBLIMAZE) injection 50 mcg (50 mcg Intravenous Given 11/19/20 0958)  furosemide (LASIX) injection 40 mg (40 mg  Intravenous Given 11/19/20 1242)  nitroGLYCERIN (NITROGLYN) 2 % ointment 1 inch (1 inch Topical Given 11/19/20 1237)    ED Course  I have reviewed the triage vital signs and the nursing notes.  Pertinent labs & imaging results that were available during my care of the patient were reviewed by me and considered in my medical decision making (see chart for details).  Clinical Course as of 11/19/20 1551  Fri Nov 19, 2020  1017 Patient oxygen saturations dropped into the 70s with good waveform.  She is now requiring 6 L of oxygen to maintain normal saturations. [AH]  1220 I spoke with Cardiology who will see the patient and assess for admission under their service or medicine. [AH]  1338 Still awaiting cardiology consult  [AH]    Clinical Course User Index [AH] Margarita Mail, PA-C   MDM Rules/Calculators/A&P                          [id number]/ sob VS:  Vitals:   11/19/20 1030 11/19/20 1236 11/19/20 1345 11/19/20 1415  BP: (!) 174/100 (!) 181/76 (!) 180/76 (!) 152/78  Pulse: 81 71 71 73  Resp: (!) 33 (!) 22 (!) 22 20  Temp:      TempSrc:      SpO2: 97% 100% 100% 100%    BC:WUGQBVQ is gathered by patient., husband, and emr. Previous records obtained and reviewed. DDX:The patient's complaint of headache, sob involves an extensive number of diagnostic and treatment options, and is a complaint that carries with it a high risk of complications, morbidity, and potential mortality. Given the large differential diagnosis, medical decision making is of high complexity. Emergent considerations for headache include subarachnoid hemorrhage, meningitis, temporal arteritis, glaucoma, cerebral ischemia, carotid/vertebral dissection, intracranial tumor, Venous sinus thrombosis, carbon monoxide poisoning, acute or chronic subdural hemorrhage.  Other considerations include: Migraine, Cluster headache, Hypertension, Caffeine, alcohol, or drug withdrawal, Pseudotumor cerebri, Arteriovenous malformation,  Head injury, Neurocysticercosis, Post-lumbar puncture, Preeclampsia, Tension headache, Sinusitis, Cervical arthritis, Refractive error causing strain, Dental abscess, Otitis media, Temporomandibular joint syndrome, Depression, Somatoform disorder (eg, somatization) Trigeminal neuralgia, Glossopharyngeal neuralgia. The emergent differential diagnosis for shortness of breath includes, but is not limited to, Pulmonary edema, bronchoconstriction, Pneumonia, Pulmonary embolism, Pneumotherax/ Hemothorax, Dysrythmia, ACS.   Labs: I ordered reviewed and interpreted labs which include Cbc w/leukocytosis of 14.  CMP with glucose of 170. abg shows uncompensated resp acidosis. Mag, troponina and lipase wnl. BNP 257 Imaging: I ordered and reviewed images which included cxr and ct head. I independently visualized and interpreted all  imaging. Significant findings include pulmonary edema of plain film. There are no acute, significant findings ct head.  EKG: EKG shows sinus rhythm at a rate of 83 with prolonged PR interval Consults: I have consulted with cardiology who does not feel that her current CHF exacerbation is due to recent TEE cardioversion MDM: Patient will be admitted by the hospitalist service for what appears to be CHF exacerbation with acute hypoxic respiratory failure.  No evidence of intracranial hemorrhage.  I have ordered Lasix, home blood pressure medications.  Respiratory status has improved.  She appears appropriate for discharge at this time. Patient disposition:The patient appears reasonably stabilized for admission considering the current resources, flow, and capabilities available in the ED at this time, and I doubt any other [facility] requiring further screening and/or treatment in the ED prior to admission.        Final Clinical Impression(s) / ED Diagnoses Final diagnoses:  None    Rx / DC Orders ED Discharge Orders    None       Margarita Mail, PA-C 11/20/20 1715    Lacretia Leigh, MD 11/22/20 [phone]

## 2020-11-19 NOTE — H&P (Addendum)
History and Physical    DERINDA BARTUS YQM:[id number] DOB: 1941-03-23 DOA: 11/19/2020  PCP: Aretta Nip, MD Consultants:  Quentin Ore - cardiology; Reynolds - GI; Buddy Duty - endocrinology Patient coming from:  Home - lives with husband; NOK: Husband, [phone]  Chief Complaint: Headache  HPI: [name] is a 80 y.o. female with medical history significant of PVD; HTN; afib; DM and stage 3 CKD presenting with headache. She had a cardioversion and all seemed to go well.  She got SOB after dinner and a terrible headache that woke her up in the middle of the night.  She has prn home O2, has not been needing it lately.  She O2 sats dropped to 77 at home and her sats improved to the 90s with O2.  She woke up and her head was killing her.  She has been having a lot of sinus drainage.  He checked her BP and it was over 200/ - this is very unusual for her.  She took her medications yesterday and is uncertain why it was elevated today.  Her headache is above the right eye and along the R temporoparietal region.  She was started on a new heart medication (Amiodarone) and her vision has not been as good since starting it.  She is no longer SOB.  She was nauseated on the way to the ER.    ED Course:  Patient with TEE cardioversion yesterday and today with severe headache, found to have CHF.  Negative head CT.Also hypoxic into the 70s, hypertensive.  Placed on home O2 but still hypoxic and CXR with significant edema. Improved with Lasix, resumed home BP meds, nitropaste.  Cardiology is not planning to admit.  Review of Systems: As per HPI; otherwise review of systems reviewed and negative.   Ambulatory Status:  Ambulates with a cane  COVID Vaccine Status:   Complete  Past Medical History:  Diagnosis Date  . Acute GI bleeding 05/13/2015  . Anxiety   . Arthritis   . Asthma   . Atrial fibrillation ([place]) 10/29/2017  . Bakers cyst, left   . Chronic kidney disease    stage 3  . Constipation   .  Diabetes mellitus    type 2  . Dysrhythmia   . GERD (gastroesophageal reflux disease)   . Heart murmur   . History of cellulitis   . History of dizziness   . History of hiatal hernia    tiny 06/15/2016  . Hypertension   . Iron deficiency anemia   . Peripheral vascular disease ([place])   . PONV (postoperative nausea and vomiting)   . Pulmonary nodule     Past Surgical History:  Procedure Laterality Date  . APPENDECTOMY  1952  . BIOPSY  10/16/2018   Procedure: BIOPSY;  Surgeon: Clarene Essex, MD;  Location: [facility] ENDOSCOPY;  Service: Endoscopy;;  . BIOPSY  11/26/2019   Procedure: BIOPSY;  Surgeon: Clarene Essex, MD;  [facility]: [facility];  Service: Endoscopy;;  . BIOPSY  07/14/2020   Procedure: BIOPSY;  Surgeon: Ronald Lobo, MD;  [facility]: [facility];  Service: Endoscopy;;  . CARDIOVERSION N/A 09/09/2020   Procedure: CARDIOVERSION;  Surgeon: Josue Hector, MD;  Location: [facility] ENDOSCOPY;  Service: Cardiovascular;  Laterality: N/A;  . COLONOSCOPY W/ POLYPECTOMY  05/15/2013  . ESOPHAGOGASTRODUODENOSCOPY (EGD) WITH PROPOFOL N/A 06/15/2016   Procedure: ESOPHAGOGASTRODUODENOSCOPY (EGD) WITH PROPOFOL;  Surgeon: Clarene Essex, MD;  [facility];  Service: Endoscopy;  Laterality: N/A;  . ESOPHAGOGASTRODUODENOSCOPY (EGD) WITH PROPOFOL N/A 10/16/2018  Procedure: ESOPHAGOGASTRODUODENOSCOPY (EGD) WITH PROPOFOL;  Surgeon: Clarene Essex, MD;  [facility];  Service: Endoscopy;  Laterality: N/A;  . ESOPHAGOGASTRODUODENOSCOPY (EGD) WITH PROPOFOL N/A 11/26/2019   Procedure: ESOPHAGOGASTRODUODENOSCOPY (EGD) WITH PROPOFOL;  Surgeon: Clarene Essex, MD;  [facility];  Service: Endoscopy;  Laterality: N/A;  . ESOPHAGOGASTRODUODENOSCOPY (EGD) WITH PROPOFOL N/A 07/14/2020   Procedure: ESOPHAGOGASTRODUODENOSCOPY (EGD) WITH PROPOFOL;  Surgeon: Ronald Lobo, MD;  [facility];  Service: Endoscopy;  Laterality: N/A;  . EYE SURGERY Bilateral    cataracts removed  . GI  RADIOFREQUENCY ABLATION  10/16/2018   Procedure: GI RADIOFREQUENCY ABLATION;  Surgeon: Clarene Essex, MD;  [facility];  Service: Endoscopy;;  . GI RADIOFREQUENCY ABLATION N/A 11/26/2019   Procedure: GI RADIOFREQUENCY ABLATION;  Surgeon: Clarene Essex, MD;  [facility];  Service: Endoscopy;  Laterality: N/A;  . HAND SURGERY Left 2010   operated on index and ring finger  . HOT HEMOSTASIS N/A 07/14/2020   Procedure: HOT HEMOSTASIS (ARGON PLASMA COAGULATION/BICAP);  Surgeon: Ronald Lobo, MD;  Location: [facility];  Service: Endoscopy;  Laterality: N/A;  . INCONTINENCE SURGERY    . JOINT REPLACEMENT Right    knee  . KNEE ARTHROSCOPY  2003 2005   right knee  . KYPHOPLASTY N/A 01/29/2020   Procedure: T8 KYPHOPLASTY;  Surgeon: Melina Schools, MD;  Location: [facility];  Service: Orthopedics;  Laterality: N/A;  60 mins Local with [facility]  . LEFT ATRIAL APPENDAGE OCCLUSION N/A 07/01/2020   Procedure: LEFT ATRIAL APPENDAGE OCCLUSION;  Surgeon: Vickie Epley, MD;  Location: [place] CV [facility];  Service: Cardiovascular;  Laterality: N/A;  . mass fallopian tube    . SPINAL CORD STIMULATOR BATTERY EXCHANGE N/A 09/09/2015   Procedure: SPINAL CORD STIMULATOR BATTERY EXCHANGE;  Surgeon: Melina Schools, MD;  Location: [facility];  Service: Orthopedics;  Laterality: N/A;  . TEE WITHOUT CARDIOVERSION N/A 08/18/2020   Procedure: TRANSESOPHAGEAL ECHOCARDIOGRAM (TEE);  Surgeon: Josue Hector, MD;  Location: [facility] ENDOSCOPY;  Service: Cardiovascular;  Laterality: N/A;  . TEE WITHOUT CARDIOVERSION N/A 09/09/2020   Procedure: TRANSESOPHAGEAL ECHOCARDIOGRAM (TEE);  Surgeon: Josue Hector, MD;  Location: [facility] ENDOSCOPY;  Service: Cardiovascular;  Laterality: N/A;  . TOTAL KNEE ARTHROPLASTY Left 11/05/2017   Procedure: LEFT TOTAL KNEE ARTHROPLASTY;  Surgeon: Paralee Cancel, MD;  [facility];  Service: Orthopedics;  Laterality: Left;  Adductor Block  . VAGINAL HYSTERECTOMY  1977   1 ovary removed     Social History   Socioeconomic History  . Marital status: Married    Spouse name: Charlotte Crumb  . Number of children: 2  . Years of education: 61  . Highest education level: Not on file  Occupational History    Comment: retired Pharmacist, hospital, IT sales professional  Tobacco Use  . Smoking status: Never Smoker  . Smokeless tobacco: Never Used  Vaping Use  . Vaping Use: Never used  Substance and Sexual Activity  . Alcohol use: No  . Drug use: No  . Sexual activity: Not Currently    Birth control/protection: Surgical  Other Topics Concern  . Not on file  Social History Narrative   Lives with husband   Caffeine use- none   Social Determinants of Health   Financial Resource Strain: Not on file  Food Insecurity: Not on file  Transportation Needs: Not on file  Physical Activity: Not on file  Stress: Not on file  Social Connections: Not on file  Intimate Partner Violence: Not on file    Allergies  Allergen  Reactions  . Vasotec Shortness Of Breath and Other (See Comments)    Wheezing, also  . Atorvastatin Other (See Comments)    Leg cramps   . Codeine Nausea And Vomiting  . Cymbalta [Duloxetine Hcl] Itching  . Lansoprazole Itching, Swelling, Rash and Other (See Comments)    Redness, swelling of mouth. During 07/12/20-07/15/20 pt tolerated IV protonix infusion with no reactions.  . Morphine And Related Itching  . Sulfate Itching  . Amitriptyline Hcl Other (See Comments)     sleepwalking  . Escitalopram Oxalate Itching  . Iron Other (See Comments)    constipated  . Sertraline Hcl Itching  . Tramadol Itching  . Adhesive [Tape] Rash  . Allevyn Adhesive [Wound Dressings] Rash  . Scopolamine Rash    Family History  Problem Relation Age of Onset  . Lung cancer Mother   . Heart attack Father   . Colon cancer Neg Hx   . Stroke Neg Hx   . Migraines Neg Hx     Prior to Admission medications   Medication Sig Start Date End Date Taking? Authorizing Provider  acetaminophen  (TYLENOL) 500 MG tablet Take 1,000 mg by mouth every 8 (eight) hours as needed for mild pain, moderate pain or headache.    Yes [provider]  albuterol (PROAIR HFA) 108 (90 BASE) MCG/ACT inhaler Inhale 2 puffs into the lungs every 4 (four) hours as needed for wheezing or shortness of breath. 02/02/15  Yes Collene Gobble, MD  ALPRAZolam Duanne Moron) 0.25 MG tablet Take 0.25 mg by mouth at bedtime.  03/25/20  Yes [provider]  amiodarone (PACERONE) 200 MG tablet Take 200 mg by mouth daily.   Yes [provider]  clopidogrel (PLAVIX) 75 MG tablet Take 75 mg by mouth daily.   Yes [provider]  Continuous Blood Gluc Sensor (FREESTYLE LIBRE 14 DAY SENSOR) MISC Inject 1 patch into the skin every 14 (fourteen) days.   Yes [provider]  diphenhydrAMINE (BENADRYL) 25 MG tablet Take 25 mg by mouth daily as needed for allergies.   Yes [provider]  docusate sodium (COLACE) 100 MG capsule Take 100 mg by mouth daily as needed for moderate constipation.   Yes [provider]  DROPLET PEN NEEDLES 32G X 4 MM MISC  04/19/20  Yes [provider]  fluticasone (FLONASE) 50 MCG/ACT nasal spray Place 1 spray into both nostrils daily as needed for allergies.    Yes [provider]  furosemide (LASIX) 40 MG tablet Take 1 tablet (40 mg total) by mouth daily. 08/31/20  Yes Vickie Epley, MD  gabapentin (NEURONTIN) 300 MG capsule Take 600 mg by mouth in the morning, at noon, and at bedtime.    Yes [provider]  insulin glargine (LANTUS) 100 UNIT/ML Solostar Pen Inject 24 Units into the skin at bedtime.   Yes [provider]  Iron-FA-B Cmp-C-Biot-Probiotic (FUSION PLUS) CAPS Take 1 capsule by mouth every evening.  11/19/19  Yes [provider]  magnesium oxide (MAG-OX) 400 (241.3 Mg) MG tablet Take 0.5 tablets (200 mg total) by mouth 2 (two) times daily. 08/31/20  Yes Vickie Epley, MD  metoprolol tartrate  (LOPRESSOR) 100 MG tablet Take 1 tablet (100 mg total) by mouth 2 (two) times daily. 07/26/20  Yes Regalado, Belkys A, MD  Multiple Vitamins-Minerals (PRESERVISION AREDS 2 PO) Take 1 capsule by mouth 2 (two) times daily.    Yes [provider]  NOVOLOG FLEXPEN 100 UNIT/ML FlexPen Inject  8-12 Units into the skin See admin instructions. Inject 8-12 units into the skin, PER SLIDING SCALE, in the morning, noon, and bedtime 09/04/19  Yes [provider]  OXYGEN Inhale 2.5 L/min into the lungs as needed (for shortness of breath).   Yes [provider]  pantoprazole (PROTONIX) 40 MG tablet Take 40 mg by mouth daily.   Yes [provider]  Pitavastatin Calcium (LIVALO) 1 MG TABS Take 1 mg by mouth at bedtime.   Yes [provider]  potassium chloride SA (KLOR-CON) 20 MEQ tablet Take 2 tablets (40 mEq total) by mouth daily. 08/31/20  Yes Vickie Epley, MD  sodium chloride (OCEAN) 0.65 % SOLN nasal spray Place 1 spray into both nostrils as needed for congestion.   Yes [provider]  venlafaxine (EFFEXOR) 37.5 MG tablet Take 37.5 mg by mouth 2 (two) times daily. 10/27/19  Yes [provider]    Physical Exam: Vitals:   11/19/20 1345 11/19/20 1415 11/19/20 1613 11/19/20 1648  BP: (!) 180/76 (!) 152/78 (!) 136/98 (!) 154/63  Pulse: 71 73 72 70  Resp: (!) 22 20 (!) 22 20  Temp:   98.4 F (36.9 C) 98.7 F (37.1 C)  TempSrc:   Oral   SpO2: 100% 100% 100% 97%     . General:  Appears calm, c/o headache . Eyes:  Mild anisocoria, EOMI, normal lids, iris; prefers to keep eyes closed . ENT:  grossly normal hearing, lips & tongue, mmm; appropriate dentition . Neck:  no LAD, masses or thyromegaly . Cardiovascular:  RRR, no m/r/g. No LE edema.  Marland Kitchen Respiratory:   CTA bilaterally with decreased breath sounds.  Mildly increased respiratory effort. . Abdomen:  soft, NT, ND, NABS . Skin:  no rash or induration seen on limited exam . Musculoskeletal:   grossly normal tone BUE/BLE, good ROM, no bony abnormality . Psychiatric:  flat mood and affect, speech fluent and appropriate, AOx3 . Neurologic:  CN 2-12 grossly intact, moves all extremities in coordinated fashion    Radiological Exams on Admission: Independently reviewed - see discussion in A/P where applicable  DG Chest 1 View  Result Date: 11/19/2020 CLINICAL DATA:  Shortness of breath EXAM: CHEST  1 VIEW COMPARISON:  07/24/2020 FINDINGS: Diffuse interstitial opacity which is new. Mild cardiomegaly. Cardiac implant in the region of the left atrial appendage. No visible effusion. No pneumothorax. Spinal cord stimulator over the thoracic spine. IMPRESSION: CHF pattern. Electronically Signed   By: Monte Fantasia M.D.   On: 11/19/2020 10:40   CT Head Wo Contrast  Result Date: 11/19/2020 CLINICAL DATA:  Headache today. EXAM: CT HEAD WITHOUT CONTRAST TECHNIQUE: Contiguous axial images were obtained from the base of the skull through the vertex without intravenous contrast. COMPARISON:  CT head 02/22/2016. FINDINGS: Brain: No evidence of acute infarction, hemorrhage, hydrocephalus, extra-axial collection or mass lesion/mass effect. Chronic microvascular ischemic change appears somewhat worse than on the prior exam. Cortical atrophy is stable in appearance. Vascular: No hyperdense vessel or unexpected calcification. Skull: Intact.  No focal lesion. Sinuses/Orbits: Status post cataract surgery. Mild mucosal thickening along the lateral aspect of the left maxillary sinus noted. Other: None. IMPRESSION: No acute abnormality. Some progression of chronic microvascular ischemic change. Cortical atrophy is stable in appearance. Mild mucosal thickening left maxillary sinus. Electronically Signed   By: Inge Rise M.D.   On: 11/19/2020 10:33   ECHO TEE  Result Date: 11/18/2020    TRANSESOPHOGEAL ECHO REPORT   Patient Name:  Lavone Neri Goshert Date of Exam: 11/18/2020 Medical Rec #:  [id number]         Height:       64.0 in Accession #:    [id number]       Weight:       145.0 lb Date of Birth:  1941/04/14         BSA:          1.706 m Patient Age:    62 years         BP:           160/54 mmHg Patient Gender: F                HR:           66 bpm. Exam Location:  Outpatient Procedure: Transesophageal Echo, 3D Echo, Cardiac Doppler and Color Doppler Indications:     I48.0 Paroxysmal atrial fibrillation  History:         Patient has prior history of Echocardiogram examinations, most                  recent 09/09/2020. Signs/Symptoms:Murmur; Risk                  Factors:Hypertension and Diabetes. WATCHMAN FLX 27 07/01/2020.                  Chronic kidney disease.  Sonographer:     Darlina Sicilian RDCS Referring Phys:  [id number] [place] Diagnosing Phys: Oswaldo Milian MD PROCEDURE: After discussion of the risks and benefits of a TEE, an informed consent was obtained from the patient. The transesophogeal probe was passed without difficulty through the esophogus of the patient. Sedation performed by different physician. The patient was monitored while under deep sedation. Anesthestetic sedation was provided intravenously by Anesthesiology: 167.39mg  of Propofol, 60mg  of Lidocaine. Image quality was good. The patient's vital signs; including heart rate, blood pressure, and oxygen saturation; remained stable throughout the procedure. The patient developed no complications during the procedure. A successful direct current cardioversion was performed at 200 joules with 1 attempt. IMPRESSIONS  1. Left ventricular ejection fraction, by estimation, is 55 to 60%. The left ventricle has normal function.  2. Right ventricular systolic function is normal. The right ventricular size is normal. There is mildly elevated pulmonary artery systolic pressure.  3. A small pericardial effusion is present.  4. Right atrial size was moderately dilated.  5. The mitral valve is normal in structure. Mild mitral valve  regurgitation.  6. The aortic valve is tricuspid. Aortic valve regurgitation is not visualized.  7. Tricuspid valve regurgitation is moderate.  8. S/p Watchman LAA occlusion device, appears well sealed. No thrombus seen. Left atrial size was moderately dilated. FINDINGS  Left Ventricle: Left ventricular ejection fraction, by estimation, is 55 to 60%. The left ventricle has normal function. The left ventricular internal cavity size was normal in size. Right Ventricle: The right ventricular size is normal. No increase in right ventricular wall thickness. Right ventricular systolic function is normal. There is mildly elevated pulmonary artery systolic pressure. Left Atrium: S/p Watchman LAA occlusion device, appears well sealed. No thrombus seen. Left atrial size was moderately dilated. No left atrial/left atrial appendage thrombus was detected. Right Atrium: Right atrial size was moderately dilated. Pericardium: A small pericardial effusion is present. Mitral Valve: The mitral valve is normal in structure. Mild mitral valve regurgitation. Tricuspid Valve: The tricuspid valve is normal in structure. Tricuspid valve regurgitation is moderate. Aortic  Valve: The aortic valve is tricuspid. Aortic valve regurgitation is not visualized. Pulmonic Valve: The pulmonic valve was grossly normal. Pulmonic valve regurgitation is not visualized. Aorta: The aortic root and ascending aorta are structurally normal, with no evidence of dilitation. IAS/Shunts: No atrial level shunt detected by color flow Doppler. Oswaldo Milian MD Electronically signed by Oswaldo Milian MD Signature Date/Time: 11/18/2020/3:48:25 PM    Final     EKG: Independently reviewed.  NSR with rate 83; no evidence of acute ischemia   Labs on Admission: I have personally reviewed the available labs and imaging studies at the time of the admission.  Pertinent labs:   Glucose 170 BNP 257.1 - improved from 07/22/20 HS troponin 14, 17 WBC  14.6 COVID/flu negative ABG: 7.271/72.7/159/33.5   Assessment/Plan Principal Problem:   Hypertensive crisis Active Problems:   HTN (hypertension)   DM type 2 with diabetic peripheral neuropathy (HCC)   Acute on chronic respiratory failure with hypoxemia (HCC)   Chronic kidney disease, stage 3 unspecified (HCC)   Presence of Watchman left atrial appendage closure device    Hypertensive crisis with CHF -Patient presenting with severe headache and SOB concerning for hypertensive crisis -She did have evidence of end organ failure, with acute respiratory failure -The initial goal of therapy would generally be to decrease the MAP by no more than 25% in the first hour and then continue to decrease the BP additionally within the next 2-6 hours -The patient received NTG and Lasix in the ER with subsequent improvemenet -Headache continues despite Dilaudid -Will observe in progressive care overnight -Home Lopressor resumed -Will add prn hydralazine  Acute volume overload associated with above -Echo yesterday with reserved EF -Uses home O2 prn but sats in 70s since last night and needing continuously -CXR with CHF pattern -Unimpressive BNP -As noted above, appears most likely associated with HTN crisis rather than true CHF  Afib -She already had a Watchman procedure and so is on Plavix for this -Cardioversion was performed yesterday and seems likely to have led to HTN crisis -Cardiology was asked to assess the patient for admission and deferred admission to [facility] -Rate controlled with Amiodarone peri-procedure  Headache -Likely associated with HTN crisis -There is a slight risk of CVA from TEE but this would likely have been seen on CT -Given persistence of symptoms, will order MRI  DM -Will check A1c -Continue Lantus -Cover with moderate-scale SSI  HLD -Take Livalo at home -Will give pravastatin in formulary substitution  Stage 3a CKD -Appears to be at/near baseline -Will  follow    Note: This patient has been tested and is negative for the novel coronavirus COVID-19. The patient has been fully vaccinated against COVID-19.   Level of care: ProgressiveProgressive DVT prophylaxis:  Lovenox Code Status:  DNR - confirmed with patient/family Family Communication: Husband was present throughout evalaution Disposition Plan:  The patient is from: home  Anticipated d/c is to: home without [facility] services  Anticipated d/c date will depend on clinical response to treatment, but possibly as early as tomorrow if she has excellent response to treatment  Patient is currently: acutely ill Consults called: Cardiology Admission status:  It is my clinical opinion that referral for OBSERVATION is reasonable and necessary in this patient based on the above information provided. The aforementioned taken together are felt to place the patient at high risk for further clinical deterioration. However it is anticipated that the patient may be medically stable for discharge from the hospital within 24 to 48 hours.  Karmen Bongo MD [facility]   How to contact the [facility] Attending or Consulting provider [place] or covering provider during after hours [place], for this patient?  1. Check the care team in [facility] and look for a) attending/consulting [facility] provider listed and b) the [facility] team listed 2. Log into [email or web address] and use [place]'s universal password to access. If you do not have the password, please contact the hospital operator. 3. Locate the [facility] provider you are looking for under [facility] and page to a number that you can be directly reached. 4. If you still have difficulty reaching the provider, please page the [facility] (Director on Call) for the Hospitalists listed on amion for assistance.   11/19/2020, 6:22 PM

## 2020-11-20 ENCOUNTER — Encounter (HOSPITAL_COMMUNITY): Payer: Self-pay | Admitting: Internal Medicine

## 2020-11-20 ENCOUNTER — Observation Stay (HOSPITAL_COMMUNITY): Payer: Medicare PPO

## 2020-11-20 DIAGNOSIS — I169 Hypertensive crisis, unspecified: Secondary | ICD-10-CM | POA: Diagnosis not present

## 2020-11-20 LAB — BASIC METABOLIC PANEL
Anion gap: 12 (ref 5–15)
BUN: 21 mg/dL (ref 8–23)
CO2: 31 mmol/L (ref 22–32)
Calcium: 9.3 mg/dL (ref 8.9–10.3)
Chloride: 96 mmol/L — ABNORMAL LOW (ref 98–111)
Creatinine, Ser: 0.97 mg/dL (ref 0.44–1.00)
GFR, Estimated: 59 mL/min — ABNORMAL LOW (ref >60)
Glucose, Bld: 125 mg/dL — ABNORMAL HIGH (ref 70–99)
Potassium: 4 mmol/L (ref 3.5–5.1)
Sodium: 139 mmol/L (ref 135–145)

## 2020-11-20 LAB — GLUCOSE, CAPILLARY
Glucose-Capillary: 161 mg/dL — ABNORMAL HIGH (ref 70–99)
Glucose-Capillary: 178 mg/dL — ABNORMAL HIGH (ref 70–99)
Glucose-Capillary: 129 mg/dL — ABNORMAL HIGH (ref 70–99)
Glucose-Capillary: 193 mg/dL — ABNORMAL HIGH (ref 70–99)

## 2020-11-20 LAB — CBC
HCT: 44.6 % (ref 36.0–46.0)
Hemoglobin: 14.1 g/dL (ref 12.0–15.0)
MCH: 27.8 pg (ref 26.0–34.0)
MCHC: 31.6 g/dL (ref 30.0–36.0)
MCV: 88 fL (ref 80.0–100.0)
Platelets: 191 10*3/uL (ref 150–400)
RBC: 5.07 MIL/uL (ref 3.87–5.11)
RDW: 15.9 % — ABNORMAL HIGH (ref 11.5–15.5)
WBC: 12.1 10*3/uL — ABNORMAL HIGH (ref 4.0–10.5)
nRBC: 0 % (ref 0.0–0.2)

## 2020-11-20 MED ORDER — GABAPENTIN 300 MG PO CAPS
300.0000 mg | ORAL_CAPSULE | Freq: Three times a day (TID) | ORAL | Status: DC
  Administered 2020-11-20 – 2020-11-21 (×3): 300 mg via ORAL
  Filled 2020-11-20 (×3): qty 1

## 2020-11-20 MED ORDER — LORATADINE 10 MG PO TABS
10.0000 mg | ORAL_TABLET | Freq: Every day | ORAL | Status: DC | PRN
  Administered 2020-11-20: 10 mg via ORAL
  Filled 2020-11-20: qty 1

## 2020-11-20 MED ORDER — SODIUM CHLORIDE 0.9 % IV SOLN
12.5000 mg | Freq: Four times a day (QID) | INTRAVENOUS | Status: DC | PRN
  Administered 2020-11-20: 12.5 mg via INTRAVENOUS
  Filled 2020-11-20: qty 0.5

## 2020-11-20 MED ORDER — HYDROMORPHONE HCL 1 MG/ML IJ SOLN
0.5000 mg | INTRAMUSCULAR | Status: DC | PRN
  Administered 2020-11-20: 0.5 mg via INTRAVENOUS
  Filled 2020-11-20: qty 1

## 2020-11-20 MED ORDER — ASPIRIN-ACETAMINOPHEN-CAFFEINE 250-250-65 MG PO TABS
1.0000 | ORAL_TABLET | Freq: Four times a day (QID) | ORAL | Status: DC | PRN
  Administered 2020-11-20 – 2020-11-21 (×3): 1 via ORAL
  Filled 2020-11-20 (×4): qty 1

## 2020-11-20 NOTE — Plan of Care (Signed)
  Problem: Education: Goal: Knowledge of General Education information will improve Description: Including pain rating scale, medication(s)/side effects and non-pharmacologic comfort measures Outcome: Progressing   Problem: Clinical Measurements: Goal: Ability to maintain clinical measurements within normal limits will improve Outcome: Progressing Goal: Will remain free from infection Outcome: Progressing Goal: Respiratory complications will improve Outcome: Progressing Goal: Cardiovascular complication will be avoided Outcome: Progressing   Problem: Activity: Goal: Risk for activity intolerance will decrease Outcome: Progressing   Problem: Nutrition: Goal: Adequate nutrition will be maintained Outcome: Progressing   

## 2020-11-20 NOTE — Progress Notes (Signed)
Pt had an MRI scheduled back in 2017 and it was canceled due to her stimulator. I attempted to call [facility] to verify stimulator information but they are currently closed. Will follow up tomorrow to see if I can gather more information.

## 2020-11-20 NOTE — Progress Notes (Signed)
C/O nausea, vomiting and headache.  Phenergan 12.5 mg IV given. Now states that nausea and headache have resolved. AM PO meds given.

## 2020-11-20 NOTE — Progress Notes (Signed)
Patient woke up with severe pain in her head.  Patient is in tears and states that " her head hurts so bad she can hardly stand it".  Patient's BP elevated hydralazine given, and pain medication given.  MD notified.     Donah Driver, RN

## 2020-11-20 NOTE — Plan of Care (Signed)
  Problem: Consults Goal: Concurrent Medical Patient Education Description: (See Patient Education Module for education specifics) Outcome: Not Applicable   Problem: [facility] Concurrent Medical Problem Goal: LTG-Pt will be physically stable and he/significant other Description: (Patient will be physically stable and he/significant other will be able to verbalize understanding of follow-up care and symptoms that would warrant further treatment) Outcome: Not Applicable Goal: STG-Vital signs will be within defined limits or stabilized Description: (STG- Vital signs will be within defined limits or stabilized for individual) Outcome: Not Applicable Goal: STG-Compliance with medication and/or treatment as ordered Description: (STG-Compliance with medication and/or treatment as ordered by MD) Outcome: Not Applicable Goal: STG-Verbalize two symptoms that would warrant further Description: (STG-Verbalize two symptoms that would warrant further treatment) Outcome: Not Applicable Goal: STG-Patient will participate in management/stabilization Description: (STG-Patient will participate in management/stabilization of medical condition) Outcome: Not Applicable Goal: STG-Other (Specify): Description: STG-Other Concurrent Medical (Specify): Outcome: Not Applicable

## 2020-11-20 NOTE — Progress Notes (Signed)
Spoke to nurse about finding out if pt has the remote for the stimulator and an implant card. RN to call back with update

## 2020-11-20 NOTE — Progress Notes (Signed)
PROGRESS NOTE    [name]  OZD:[id number] DOB: 04/08/1941 DOA: 11/19/2020 PCP: Aretta Nip, MD     Brief Narrative:  [name] is a 80 y.o. female with medical history significant of PVD; HTN; afib; DM and stage 3 CKD presenting with headache. She had a cardioversion on 3/24 without immediate complication. She was home and got SOB after dinner and a terrible headache that woke her up in the middle of the night.  She has prn home O2, has not been needing it lately.  Her O2 sats dropped to 77% at home and her sats improved to the 90s with O2.  She woke up and her head was killing her.  She has been having a lot of sinus drainage.  Her SBP was found to be over 200.  Her headache is above the right eye and along the R temporoparietal region.  Chest x-ray in the emergency department revealed pulmonary edema.  She had a negative head CT.  Cardiology was consulted on admission.  New events last 24 hours / Subjective: Continues to have significant right-sided headache which she describes as a "skull cap"-like pressure.  She also admits to nausea.  Assessment & Plan:   Principal Problem:   Hypertensive crisis Active Problems:   HTN (hypertension)   DM type 2 with diabetic peripheral neuropathy (HCC)   Acute on chronic respiratory failure with hypoxemia (HCC)   Chronic kidney disease, stage 3 unspecified (HCC)   Presence of Watchman left atrial appendage closure device   Hypertensive crisis -Presented to the emergency department with SBP as high as 191  -Continue Lopressor, Lasix  Acute on chronic diastolic CHF -Echocardiogram revealed EF of 55-60 -BNP 257.1 -Chest x-ray revealed diffuse interstitial opacity, CHF pattern -Continue IV lasix   Headache -Head CT without acute abnormality -Question if this is related to hypertensive crisis, cardiology did not believe it to be -Check brain MRI  Acute hypoxemic respiratory failure -SPO2 in the 70s on room air.   Currently on 5 L nasal cannula O2  Paroxysmal atrial fibrillation -Status post cardioversion 3/24 -Remains in normal sinus rhythm this morning -Continue amiodarone, plavix, Lopressor  Diabetes mellitus -Continue Lantus, sliding scale insulin  Hyperlipidemia -Continue Pravachol  CKD stage IIIa -Baseline creatinine around 1-1.2 -Stable  Depression/anxiety -Continue Effexor, Xanax    DVT prophylaxis:  enoxaparin (LOVENOX) injection 40 mg Start: 11/19/20 1630  Code Status: DNR Family Communication: Spouse at bedside Disposition Plan:  Status is: Observation  The patient will require care spanning > 2 midnights and should be moved to inpatient because: Inpatient level of care appropriate due to severity of illness  Dispo: The patient is from: Home              Anticipated d/c is to: Home              Patient currently is not medically stable to d/c.  Continue treatment for headache, nausea, brain MRI pending   Difficult to place patient No      Consultants:   Cardiology  Procedures:   None  Antimicrobials:  Anti-infectives (From admission, onward)   None        Objective: Vitals:   11/20/20 0240 11/20/20 0310 11/20/20 0340 11/20/20 0424  BP: (!) 166/73 (!) 163/67 (!) 175/72   Pulse: 69 70 71   Resp:      Temp:    98.9 F (37.2 C)  TempSrc:    Oral  SpO2: 96% 96% 94%  Intake/Output Summary (Last 24 hours) at 11/20/2020 1057 Last data filed at 11/20/2020 0359 Gross per 24 hour  Intake -  Output 1200 ml  Net -1200 ml   There were no vitals filed for this visit.  Examination:  General exam: Appears calm but uncomfortable, nauseated, with wet wash cloth over her forehead, closed eyes throughout exam   Respiratory system: Respiratory effort normal. No respiratory distress. No conversational dyspnea.  Cardiovascular system: S1 & S2 heard, RRR. No murmurs. No pedal edema. Gastrointestinal system: Abdomen is nondistended, soft and nontender. Normal  bowel sounds heard. Central nervous system: Alert and oriented. No focal neurological deficits. Speech clear.  Extremities: Symmetric in appearance  Skin: No rashes, lesions or ulcers on exposed skin  Psychiatry: Judgement and insight appear normal. Mood & affect appropriate.   Data Reviewed: I have personally reviewed following labs and imaging studies  CBC: Recent Labs  Lab 11/19/20 0955 11/19/20 1042 11/20/20 0121  WBC 14.6*  --  12.1*  NEUTROABS 12.1*  --   --   HGB 14.4 15.3* 14.1  HCT 47.5* 45.0 44.6  MCV 89.5  --  88.0  PLT 233  --  250   Basic Metabolic Panel: Recent Labs  Lab 11/19/20 0955 11/19/20 1042 11/19/20 1050 11/20/20 0121  NA 139 138  --  139  K 4.3 4.3  --  4.0  CL 102  --   --  96*  CO2 29  --   --  31  GLUCOSE 170*  --   --  125*  BUN 21  --   --  21  CREATININE 0.93  --   --  0.97  CALCIUM 9.6  --   --  9.3  MG  --   --  2.0  --    GFR: Estimated Creatinine Clearance: 44.2 mL/min (by C-G formula based on SCr of 0.97 mg/dL). Liver Function Tests: Recent Labs  Lab 11/19/20 0955  AST 22  ALT 28  ALKPHOS 78  BILITOT 0.8  PROT 6.9  ALBUMIN 4.0   Recent Labs  Lab 11/19/20 0955  LIPASE 29   No results for input(s): AMMONIA in the last 168 hours. Coagulation Profile: No results for input(s): INR, PROTIME in the last 168 hours. Cardiac Enzymes: No results for input(s): CKTOTAL, CKMB, CKMBINDEX, TROPONINI in the last 168 hours. BNP (last 3 results) No results for input(s): PROBNP in the last 8760 hours. HbA1C: No results for input(s): HGBA1C in the last 72 hours. CBG: Recent Labs  Lab 11/18/20 0826 11/19/20 1654 11/19/20 2106 11/20/20 0732  GLUCAP 127* 207* 229* 161*   Lipid Profile: No results for input(s): CHOL, HDL, LDLCALC, TRIG, CHOLHDL, LDLDIRECT in the last 72 hours. Thyroid Function Tests: No results for input(s): TSH, T4TOTAL, FREET4, T3FREE, THYROIDAB in the last 72 hours. Anemia Panel: No results for input(s):  VITAMINB12, FOLATE, FERRITIN, TIBC, IRON, RETICCTPCT in the last 72 hours. Sepsis Labs: No results for input(s): PROCALCITON, LATICACIDVEN in the last 168 hours.  Recent Results (from the past 240 hour(s))  SARS CORONAVIRUS 2 (TAT 6-24 HRS) Nasopharyngeal Nasopharyngeal Swab     Status: None   Collection Time: 11/16/20  1:02 PM   Specimen: Nasopharyngeal Swab  Result Value Ref Range Status   SARS Coronavirus 2 NEGATIVE NEGATIVE Final    Comment: (NOTE) SARS-CoV-2 target nucleic acids are NOT DETECTED.  The SARS-CoV-2 RNA is generally detectable in upper and lower respiratory specimens during the acute phase of infection. Negative results do not preclude  SARS-CoV-2 infection, do not rule out co-infections with other pathogens, and should not be used as the sole basis for treatment or other patient management decisions. Negative results must be combined with clinical observations, patient history, and epidemiological information. The expected result is Negative.  Fact Sheet for Patients: SugarRoll.be  Fact Sheet for Healthcare Providers: https://www.woods-mathews.com/  This test is not yet approved or cleared by the [place] FDA and  has been authorized for detection and/or diagnosis of SARS-CoV-2 by FDA under an Emergency Use Authorization (EUA). This EUA will remain  in effect (meaning this test can be used) for the duration of the COVID-19 declaration under Se ction 564(b)(1) of the Act, 21 U.S.C. section 360bbb-3(b)(1), unless the authorization is terminated or revoked sooner.  Performed at [facility], [place] [address], [place], [place] [address]   Resp Panel by RT-PCR (Flu A&B, Covid) Nasopharyngeal Swab     Status: None   Collection Time: 11/19/20  9:55 AM   Specimen: Nasopharyngeal Swab; Nasopharyngeal(NP) swabs in vial transport medium  Result Value Ref Range Status   SARS Coronavirus 2 by RT PCR NEGATIVE NEGATIVE  Final    Comment: (NOTE) SARS-CoV-2 target nucleic acids are NOT DETECTED.  The SARS-CoV-2 RNA is generally detectable in upper respiratory specimens during the acute phase of infection. The lowest concentration of SARS-CoV-2 viral copies this assay can detect is 138 copies/mL. A negative result does not preclude SARS-Cov-2 infection and should not be used as the sole basis for treatment or other patient management decisions. A negative result may occur with  improper specimen collection/handling, submission of specimen other than nasopharyngeal swab, presence of viral mutation(s) within the areas targeted by this assay, and inadequate number of viral copies(<138 copies/mL). A negative result must be combined with clinical observations, patient history, and epidemiological information. The expected result is Negative.  Fact Sheet for Patients:  EntrepreneurPulse.com.au  Fact Sheet for Healthcare Providers:  IncredibleEmployment.be  This test is no t yet approved or cleared by the [place] FDA and  has been authorized for detection and/or diagnosis of SARS-CoV-2 by FDA under an Emergency Use Authorization (EUA). This EUA will remain  in effect (meaning this test can be used) for the duration of the COVID-19 declaration under Section 564(b)(1) of the Act, 21 U.S.C.section 360bbb-3(b)(1), unless the authorization is terminated  or revoked sooner.       Influenza A by PCR NEGATIVE NEGATIVE Final   Influenza B by PCR NEGATIVE NEGATIVE Final    Comment: (NOTE) The Xpert Xpress SARS-CoV-2/FLU/RSV plus assay is intended as an aid in the diagnosis of influenza from Nasopharyngeal swab specimens and should not be used as a sole basis for treatment. Nasal washings and aspirates are unacceptable for Xpert Xpress SARS-CoV-2/FLU/RSV testing.  Fact Sheet for Patients: EntrepreneurPulse.com.au  Fact Sheet for Healthcare  Providers: IncredibleEmployment.be  This test is not yet approved or cleared by the [place] FDA and has been authorized for detection and/or diagnosis of SARS-CoV-2 by FDA under an Emergency Use Authorization (EUA). This EUA will remain in effect (meaning this test can be used) for the duration of the COVID-19 declaration under Section 564(b)(1) of the Act, 21 U.S.C. section 360bbb-3(b)(1), unless the authorization is terminated or revoked.  Performed at [facility], [place] [address], [place], [place] [address]       Radiology Studies: DG Chest 1 View  Result Date: 11/19/2020 CLINICAL DATA:  Shortness of breath EXAM: CHEST  1 VIEW COMPARISON:  07/24/2020  FINDINGS: Diffuse interstitial opacity which is new. Mild cardiomegaly. Cardiac implant in the region of the left atrial appendage. No visible effusion. No pneumothorax. Spinal cord stimulator over the thoracic spine. IMPRESSION: CHF pattern. Electronically Signed   By: Monte Fantasia M.D.   On: 11/19/2020 10:40   CT Head Wo Contrast  Result Date: 11/19/2020 CLINICAL DATA:  Headache today. EXAM: CT HEAD WITHOUT CONTRAST TECHNIQUE: Contiguous axial images were obtained from the base of the skull through the vertex without intravenous contrast. COMPARISON:  CT head 02/22/2016. FINDINGS: Brain: No evidence of acute infarction, hemorrhage, hydrocephalus, extra-axial collection or mass lesion/mass effect. Chronic microvascular ischemic change appears somewhat worse than on the prior exam. Cortical atrophy is stable in appearance. Vascular: No hyperdense vessel or unexpected calcification. Skull: Intact.  No focal lesion. Sinuses/Orbits: Status post cataract surgery. Mild mucosal thickening along the lateral aspect of the left maxillary sinus noted. Other: None. IMPRESSION: No acute abnormality. Some progression of chronic microvascular ischemic change. Cortical atrophy is stable in appearance. Mild mucosal thickening  left maxillary sinus. Electronically Signed   By: Inge Rise M.D.   On: 11/19/2020 10:33      Scheduled Meds: . ALPRAZolam  0.25 mg Oral QHS  . amiodarone  200 mg Oral Daily  . clopidogrel  75 mg Oral Daily  . docusate sodium  100 mg Oral BID  . enoxaparin (LOVENOX) injection  40 mg Subcutaneous Q24H  . furosemide  40 mg Intravenous BID  . gabapentin  600 mg Oral TID  . insulin aspart  0-15 Units Subcutaneous TID WC  . insulin aspart  0-5 Units Subcutaneous QHS  . insulin glargine  24 Units Subcutaneous QHS  . magnesium oxide  200 mg Oral BID  . metoprolol tartrate  100 mg Oral BID  . pantoprazole  40 mg Oral Daily  . pravastatin  20 mg Oral q1800  . sodium chloride flush  3 mL Intravenous Q12H  . venlafaxine  37.5 mg Oral BID   Continuous Infusions: . promethazine (PHENERGAN) injection 12.5 mg (11/20/20 0856)     LOS: 0 days      Time spent: 30 minutes   Dessa Phi, DO [facility] 11/20/2020, 10:57 AM   Available via Epic secure chat 7am-7pm After these hours, please refer to coverage provider listed on [email or web address]

## 2020-11-21 DIAGNOSIS — I169 Hypertensive crisis, unspecified: Secondary | ICD-10-CM | POA: Diagnosis not present

## 2020-11-21 LAB — BASIC METABOLIC PANEL
Anion gap: 8 (ref 5–15)
BUN: 30 mg/dL — ABNORMAL HIGH (ref 8–23)
CO2: 38 mmol/L — ABNORMAL HIGH (ref 22–32)
Calcium: 9.1 mg/dL (ref 8.9–10.3)
Chloride: 91 mmol/L — ABNORMAL LOW (ref 98–111)
Creatinine, Ser: 1.09 mg/dL — ABNORMAL HIGH (ref 0.44–1.00)
GFR, Estimated: 52 mL/min — ABNORMAL LOW (ref >60)
Glucose, Bld: 135 mg/dL — ABNORMAL HIGH (ref 70–99)
Potassium: 3.3 mmol/L — ABNORMAL LOW (ref 3.5–5.1)
Sodium: 137 mmol/L (ref 135–145)

## 2020-11-21 LAB — CBC
HCT: 41.4 % (ref 36.0–46.0)
Hemoglobin: 12.6 g/dL (ref 12.0–15.0)
MCH: 27.2 pg (ref 26.0–34.0)
MCHC: 30.4 g/dL (ref 30.0–36.0)
MCV: 89.2 fL (ref 80.0–100.0)
Platelets: 171 10*3/uL (ref 150–400)
RBC: 4.64 MIL/uL (ref 3.87–5.11)
RDW: 15.4 % (ref 11.5–15.5)
WBC: 8.1 10*3/uL (ref 4.0–10.5)
nRBC: 0 % (ref 0.0–0.2)

## 2020-11-21 LAB — GLUCOSE, CAPILLARY: Glucose-Capillary: 136 mg/dL — ABNORMAL HIGH (ref 70–99)

## 2020-11-21 MED ORDER — POTASSIUM CHLORIDE CRYS ER 20 MEQ PO TBCR
40.0000 meq | EXTENDED_RELEASE_TABLET | Freq: Once | ORAL | Status: AC
  Administered 2020-11-21: 40 meq via ORAL
  Filled 2020-11-21: qty 2

## 2020-11-21 NOTE — Progress Notes (Signed)
Plan d/c home today. Pt does not have O2 tank from home. Oxygen sats checked on RA = 80%. Pt and husband made aware that they will need tank from home for discharge. Verbalized understanding.

## 2020-11-21 NOTE — TOC Transition Note (Signed)
Transition of Care (TOC) - CM/SW Discharge Note Marvetta Gibbons RN, BSN Transitions of Care Unit 4E- RN Case Manager See Treatment Team for direct phone # Weekend cross coverage   Patient Details  Name: [name] MRN: [id number] Date of Birth: [birth year]  Transition of Care [facility]) CM/SW Contact:  Dawayne Patricia, RN Phone Number: 11/21/2020, 11:33 AM   Clinical Narrative:    Pt for transition home today, notified by bedside RN that pt is active with [facility] agency. Pt unsure what agency she is active with- per PING review- pt is active with [facility].  Call made to [facility] to confirm services, spoke with Mickel Baas. Per conversation with Mickel Baas at [facility] pt is active with them for HHPT- as she is under obs stay she does not need new [place] orders and they will plan to resume [facility] visits upon discharge. Pt is in the middle of her auth period for [facility] services.  Pt has home 02 and bedside RN as instructed the pt/husband that husband will need to bring portable tank for transport home.    Final next level of care: [place] Barriers to Discharge: No Barriers Identified   Patient Goals and CMS Choice    resumption of [place] services    Discharge Placement               home with [facility]        Discharge Plan and Services     Post Acute Care Choice: Home Health,Resumption of Svcs/PTA Provider          DME Arranged: N/A DME Agency: NA       HH Arranged: PT HH Agency: [facility] Date [place]: 11/21/20 Time [place]: [id number] Representative spoke with at [place]: [place] Determinants of Health ([place]) Interventions     Readmission Risk Interventions No flowsheet data found.

## 2020-11-21 NOTE — Discharge Summary (Signed)
Physician Discharge Summary  [name] WSF:[id number] DOB: 09/10/1940 DOA: 11/19/2020  PCP: Aretta Nip, MD  Admit date: 11/19/2020 Discharge date: 11/21/2020  Admitted From: home Disposition:  home  Recommendations for Outpatient Follow-up:  1. Follow up with Cardiology as scheduled on 3/29  2. Follow up with ENT as scheduled on 4/1  Discharge Condition: Stable CODE STATUS: DNR  Diet recommendation: Heart healthy   Brief/Interim Summary: [name] is a 80 y.o.femalewith medical history significant ofPVD; HTN; afib; DM and stage 3 CKD presenting with headache.She had a cardioversion on 3/24 without immediate complication. She was home and got SOB after dinner and a terrible headache that woke her up in the middle of the night. She has prn home O2, has not been needing it lately. Her O2 sats dropped to 77% at home and her sats improved to the 90s with O2. She woke up and her head was killing her. She has been having a lot of sinus drainage. Her SBP was found to be over 200.  Her headache is above the right eye and along the R temporoparietal region. Chest x-ray in the emergency department revealed pulmonary edema.  She had a negative head CT.  Cardiology was consulted on admission.   Patient was given lopressor, IV lasix with improvement in her fluid status and BP. There was issue getting MRI brain completed due to her history of stimulator in place. She has had no focal deficits to indicate TIA/CVA. Patient's headache and nausea continued to improve with symptom management. Upon getting further history, she states that she has had chronic sinus issues since breaking her nose in 2017, getting worse with drainage over the past several months. She states she has maxilla sinus pressure which radiates to her right side head. Today, her headache and nausea is much better. Her and husband are in agreement with discharge home and to follow up closely with scheduled cardiology  and ENT appointments.   Discharge Diagnoses:  Principal Problem:   Hypertensive crisis Active Problems:   HTN (hypertension)   DM type 2 with diabetic peripheral neuropathy (HCC)   Acute on chronic respiratory failure with hypoxemia (HCC)   Chronic kidney disease, stage 3 unspecified (HCC)   Presence of Watchman left atrial appendage closure device   Hypertensive crisis -Presented to the emergency department with SBP as high as 191  -Continue Lopressor, Lasix -BP improved, 113/44 this morning   Acute on chronic diastolic CHF -Echocardiogram revealed EF of 55-60 -BNP 257.1 -Chest x-ray revealed diffuse interstitial opacity, CHF pattern -Given IV lasix, improved fluid status today   Headache -Head CT without acute abnormality -Question if this is related to hypertensive crisis vs referred pain from sinusitis  -Brain MRI not obtained due to stimulator in place -Improved, has ENT follow up on 4/1 for chronic sinus drainage   Acute on chronic hypoxemic respiratory failure -SPO2 in the 70s on room air on admission. Uses 1.5-2L O2 PRN at baseline. Wean O2.   Paroxysmal atrial fibrillation -Status post cardioversion 3/24 -Continue amiodarone, plavix, Lopressor -Remains on NSR today   Diabetes mellitus -Continue Lantus, sliding scale insulin  Hyperlipidemia -Continue Pravachol  CKD stage IIIa -Baseline creatinine around 1-1.2 -Stable  Depression/anxiety -Continue Effexor, Xanax  Hypokalemia -Replace    Discharge Instructions  Discharge Instructions    (HEART FAILURE PATIENTS) Call MD:  Anytime you have any of the following symptoms: 1) 3 pound weight gain in 24 hours or 5 pounds in 1 week 2) shortness of  breath, with or without a dry hacking cough 3) swelling in the hands, feet or stomach 4) if you have to sleep on extra pillows at night in order to breathe.   Complete by: As directed    Call MD for:  difficulty breathing, headache or visual disturbances    Complete by: As directed    Call MD for:  extreme fatigue   Complete by: As directed    Call MD for:  persistant dizziness or light-headedness   Complete by: As directed    Call MD for:  persistant nausea and vomiting   Complete by: As directed    Call MD for:  severe uncontrolled pain   Complete by: As directed    Call MD for:  temperature >100.4   Complete by: As directed    Diet - low sodium heart healthy   Complete by: As directed    Discharge instructions   Complete by: As directed    You were cared for by a hospitalist during your hospital stay. If you have any questions about your discharge medications or the care you received while you were in the hospital after you are discharged, you can call the unit and ask to speak with the hospitalist on call if the hospitalist that took care of you is not available. Once you are discharged, your primary care physician will handle any further medical issues. Please note that NO REFILLS for any discharge medications will be authorized once you are discharged, as it is imperative that you return to your primary care physician (or establish a relationship with a primary care physician if you do not have one) for your aftercare needs so that they can reassess your need for medications and monitor your lab values.   Increase activity slowly   Complete by: As directed      Allergies as of 11/21/2020      Reactions   Vasotec Shortness Of Breath, Other (See Comments)   Wheezing, also   Atorvastatin Other (See Comments)   Leg cramps   Codeine Nausea And Vomiting   Cymbalta [duloxetine Hcl] Itching   Lansoprazole Itching, Swelling, Rash, Other (See Comments)   Redness, swelling of mouth. During 07/12/20-07/15/20 pt tolerated IV protonix infusion with no reactions.   Morphine And Related Itching   Sulfate Itching   Amitriptyline Hcl Other (See Comments)    sleepwalking   Escitalopram Oxalate Itching   Iron Other (See Comments)   constipated    Sertraline Hcl Itching   Tramadol Itching   Adhesive [tape] Rash   Allevyn Adhesive [wound Dressings] Rash   Scopolamine Rash      Medication List    TAKE these medications   acetaminophen 500 MG tablet Commonly known as: TYLENOL Take 1,000 mg by mouth every 8 (eight) hours as needed for mild pain, moderate pain or headache.   albuterol 108 (90 Base) MCG/ACT inhaler Commonly known as: ProAir HFA Inhale 2 puffs into the lungs every 4 (four) hours as needed for wheezing or shortness of breath.   ALPRAZolam 0.25 MG tablet Commonly known as: XANAX Take 0.25 mg by mouth at bedtime.   amiodarone 200 MG tablet Commonly known as: PACERONE Take 200 mg by mouth daily.   clopidogrel 75 MG tablet Commonly known as: PLAVIX Take 75 mg by mouth daily.   diphenhydrAMINE 25 MG tablet Commonly known as: BENADRYL Take 25 mg by mouth daily as needed for allergies.   docusate sodium 100 MG capsule Commonly known as: COLACE  Take 100 mg by mouth daily as needed for moderate constipation.   Droplet Pen Needles 32G X 4 MM Misc Generic drug: Insulin Pen Needle   fluticasone 50 MCG/ACT nasal spray Commonly known as: FLONASE Place 1 spray into both nostrils daily as needed for allergies.   FreeStyle Libre 14 Day Sensor Misc Inject 1 patch into the skin every 14 (fourteen) days.   furosemide 40 MG tablet Commonly known as: LASIX Take 1 tablet (40 mg total) by mouth daily.   Fusion Plus Caps Take 1 capsule by mouth every evening.   gabapentin 300 MG capsule Commonly known as: NEURONTIN Take 600 mg by mouth in the morning, at noon, and at bedtime.   insulin glargine 100 UNIT/ML Solostar Pen Commonly known as: LANTUS Inject 24 Units into the skin at bedtime.   Livalo 1 MG Tabs Generic drug: Pitavastatin Calcium Take 1 mg by mouth at bedtime.   magnesium oxide 400 (241.3 Mg) MG tablet Commonly known as: MAG-OX Take 0.5 tablets (200 mg total) by mouth 2 (two) times daily.    metoprolol tartrate 100 MG tablet Commonly known as: LOPRESSOR Take 1 tablet (100 mg total) by mouth 2 (two) times daily.   NovoLOG FlexPen 100 UNIT/ML FlexPen Generic drug: insulin aspart Inject 8-12 Units into the skin See admin instructions. Inject 8-12 units into the skin, PER SLIDING SCALE, in the morning, noon, and bedtime   OXYGEN Inhale 2.5 L/min into the lungs as needed (for shortness of breath).   pantoprazole 40 MG tablet Commonly known as: PROTONIX Take 40 mg by mouth daily.   potassium chloride SA 20 MEQ tablet Commonly known as: KLOR-CON Take 2 tablets (40 mEq total) by mouth daily.   PRESERVISION AREDS 2 PO Take 1 capsule by mouth 2 (two) times daily.   sodium chloride 0.65 % Soln nasal spray Commonly known as: OCEAN Place 1 spray into both nostrils as needed for congestion.   venlafaxine 37.5 MG tablet Commonly known as: EFFEXOR Take 37.5 mg by mouth 2 (two) times daily.       Follow-up Information    Jerline Pain, MD. Go on 11/23/2020.   Specialty: Cardiology Contact information: [address] [place] [address] [phone]        ENT. Go on 11/26/2020.              Allergies  Allergen Reactions   Vasotec Shortness Of Breath and Other (See Comments)    Wheezing, also   Atorvastatin Other (See Comments)    Leg cramps    Codeine Nausea And Vomiting   Cymbalta [Duloxetine Hcl] Itching   Lansoprazole Itching, Swelling, Rash and Other (See Comments)    Redness, swelling of mouth. During 07/12/20-07/15/20 pt tolerated IV protonix infusion with no reactions.   Morphine And Related Itching   Sulfate Itching   Amitriptyline Hcl Other (See Comments)     sleepwalking   Escitalopram Oxalate Itching   Iron Other (See Comments)    constipated   Sertraline Hcl Itching   Tramadol Itching   Adhesive [Tape] Rash   Allevyn Adhesive [Wound Dressings] Rash   Scopolamine Rash    Consultations:  Cardiology     Procedures/Studies: DG Chest 1 View  Result Date: 11/19/2020 CLINICAL DATA:  Shortness of breath EXAM: CHEST  1 VIEW COMPARISON:  07/24/2020 FINDINGS: Diffuse interstitial opacity which is new. Mild cardiomegaly. Cardiac implant in the region of the left atrial appendage. No visible effusion. No pneumothorax. Spinal cord stimulator  over the thoracic spine. IMPRESSION: CHF pattern. Electronically Signed   By: Monte Fantasia M.D.   On: 11/19/2020 10:40   CT Head Wo Contrast  Result Date: 11/19/2020 CLINICAL DATA:  Headache today. EXAM: CT HEAD WITHOUT CONTRAST TECHNIQUE: Contiguous axial images were obtained from the base of the skull through the vertex without intravenous contrast. COMPARISON:  CT head 02/22/2016. FINDINGS: Brain: No evidence of acute infarction, hemorrhage, hydrocephalus, extra-axial collection or mass lesion/mass effect. Chronic microvascular ischemic change appears somewhat worse than on the prior exam. Cortical atrophy is stable in appearance. Vascular: No hyperdense vessel or unexpected calcification. Skull: Intact.  No focal lesion. Sinuses/Orbits: Status post cataract surgery. Mild mucosal thickening along the lateral aspect of the left maxillary sinus noted. Other: None. IMPRESSION: No acute abnormality. Some progression of chronic microvascular ischemic change. Cortical atrophy is stable in appearance. Mild mucosal thickening left maxillary sinus. Electronically Signed   By: Inge Rise M.D.   On: 11/19/2020 10:33   ECHO TEE  Result Date: 11/18/2020    TRANSESOPHOGEAL ECHO REPORT   Patient Name:   THERMA LASURE Date of Exam: 11/18/2020 Medical Rec #:  [id number]        Height:       64.0 in Accession #:    [id number]       Weight:       145.0 lb Date of Birth:  Oct 03, 1940         BSA:          1.706 m Patient Age:    80 years         BP:           160/54 mmHg Patient Gender: F                HR:           66 bpm. Exam Location:  Outpatient Procedure: Transesophageal  Echo, 3D Echo, Cardiac Doppler and Color Doppler Indications:     I48.0 Paroxysmal atrial fibrillation  History:         Patient has prior history of Echocardiogram examinations, most                  recent 09/09/2020. Signs/Symptoms:Murmur; Risk                  Factors:Hypertension and Diabetes. WATCHMAN FLX 27 07/01/2020.                  Chronic kidney disease.  Sonographer:     Darlina Sicilian RDCS Referring Phys:  [id number] [place] Diagnosing Phys: Oswaldo Milian MD PROCEDURE: After discussion of the risks and benefits of a TEE, an informed consent was obtained from the patient. The transesophogeal probe was passed without difficulty through the esophogus of the patient. Sedation performed by different physician. The patient was monitored while under deep sedation. Anesthestetic sedation was provided intravenously by Anesthesiology: 167.39mg  of Propofol, 60mg  of Lidocaine. Image quality was good. The patient's vital signs; including heart rate, blood pressure, and oxygen saturation; remained stable throughout the procedure. The patient developed no complications during the procedure. A successful direct current cardioversion was performed at 200 joules with 1 attempt. IMPRESSIONS  1. Left ventricular ejection fraction, by estimation, is 55 to 60%. The left ventricle has normal function.  2. Right ventricular systolic function is normal. The right ventricular size is normal. There is mildly elevated pulmonary artery systolic pressure.  3. A small pericardial effusion is present.  4. Right  atrial size was moderately dilated.  5. The mitral valve is normal in structure. Mild mitral valve regurgitation.  6. The aortic valve is tricuspid. Aortic valve regurgitation is not visualized.  7. Tricuspid valve regurgitation is moderate.  8. S/p Watchman LAA occlusion device, appears well sealed. No thrombus seen. Left atrial size was moderately dilated. FINDINGS  Left Ventricle: Left ventricular ejection  fraction, by estimation, is 55 to 60%. The left ventricle has normal function. The left ventricular internal cavity size was normal in size. Right Ventricle: The right ventricular size is normal. No increase in right ventricular wall thickness. Right ventricular systolic function is normal. There is mildly elevated pulmonary artery systolic pressure. Left Atrium: S/p Watchman LAA occlusion device, appears well sealed. No thrombus seen. Left atrial size was moderately dilated. No left atrial/left atrial appendage thrombus was detected. Right Atrium: Right atrial size was moderately dilated. Pericardium: A small pericardial effusion is present. Mitral Valve: The mitral valve is normal in structure. Mild mitral valve regurgitation. Tricuspid Valve: The tricuspid valve is normal in structure. Tricuspid valve regurgitation is moderate. Aortic Valve: The aortic valve is tricuspid. Aortic valve regurgitation is not visualized. Pulmonic Valve: The pulmonic valve was grossly normal. Pulmonic valve regurgitation is not visualized. Aorta: The aortic root and ascending aorta are structurally normal, with no evidence of dilitation. IAS/Shunts: No atrial level shunt detected by color flow Doppler. Oswaldo Milian MD Electronically signed by Oswaldo Milian MD Signature Date/Time: 11/18/2020/3:48:25 PM    Final    LONG TERM MONITOR (3-14 DAYS)  Result Date: 10/28/2020 HR 46-129bpm, average 68bpm. AF 100% of monitoring period. Rare ventricular ectopy. Lysbeth Galas T. Quentin Ore, MD, [facility] Cardiac Electrophysiology     Discharge Exam: Vitals:   11/20/20 2131 11/21/20 0508  BP: (!) 119/53 (!) 113/44  Pulse: 69 65  Resp: 18 20  Temp: 98.1 F (36.7 C) 98.4 F (36.9 C)  SpO2: 100% 95%    General: Pt is alert, awake, not in acute distress Cardiovascular: RRR, S1/S2 +, no edema Respiratory: CTA bilaterally, no wheezing, no rhonchi, no respiratory distress, no conversational dyspnea  Abdominal: Soft, NT, ND, bowel  sounds + Extremities: no edema, no cyanosis Psych: Normal mood and affect, stable judgement and insight     The results of significant diagnostics from this hospitalization (including imaging, microbiology, ancillary and laboratory) are listed below for reference.     Microbiology: Recent Results (from the past 240 hour(s))  SARS CORONAVIRUS 2 (TAT 6-24 HRS) Nasopharyngeal Nasopharyngeal Swab     Status: None   Collection Time: 11/16/20  1:02 PM   Specimen: Nasopharyngeal Swab  Result Value Ref Range Status   SARS Coronavirus 2 NEGATIVE NEGATIVE Final    Comment: (NOTE) SARS-CoV-2 target nucleic acids are NOT DETECTED.  The SARS-CoV-2 RNA is generally detectable in upper and lower respiratory specimens during the acute phase of infection. Negative results do not preclude SARS-CoV-2 infection, do not rule out co-infections with other pathogens, and should not be used as the sole basis for treatment or other patient management decisions. Negative results must be combined with clinical observations, patient history, and epidemiological information. The expected result is Negative.  Fact Sheet for Patients: SugarRoll.be  Fact Sheet for Healthcare Providers: https://www.woods-mathews.com/  This test is not yet approved or cleared by the [place] FDA and  has been authorized for detection and/or diagnosis of SARS-CoV-2 by FDA under an Emergency Use Authorization (EUA). This EUA will remain  in effect (meaning this test can be used) for the  duration of the COVID-19 declaration under Se ction 564(b)(1) of the Act, 21 U.S.C. section 360bbb-3(b)(1), unless the authorization is terminated or revoked sooner.  Performed at [facility], [place] [address] [place], [place] [address]   Resp Panel by RT-PCR (Flu A&B, Covid) Nasopharyngeal Swab     Status: None   Collection Time: 11/19/20  9:55 AM   Specimen: Nasopharyngeal Swab;  Nasopharyngeal(NP) swabs in vial transport medium  Result Value Ref Range Status   SARS Coronavirus 2 by RT PCR NEGATIVE NEGATIVE Final    Comment: (NOTE) SARS-CoV-2 target nucleic acids are NOT DETECTED.  The SARS-CoV-2 RNA is generally detectable in upper respiratory specimens during the acute phase of infection. The lowest concentration of SARS-CoV-2 viral copies this assay can detect is 138 copies/mL. A negative result does not preclude SARS-Cov-2 infection and should not be used as the sole basis for treatment or other patient management decisions. A negative result may occur with  improper specimen collection/handling, submission of specimen other than nasopharyngeal swab, presence of viral mutation(s) within the areas targeted by this assay, and inadequate number of viral copies(<138 copies/mL). A negative result must be combined with clinical observations, patient history, and epidemiological information. The expected result is Negative.  Fact Sheet for Patients:  EntrepreneurPulse.com.au  Fact Sheet for Healthcare Providers:  IncredibleEmployment.be  This test is no t yet approved or cleared by the [place] FDA and  has been authorized for detection and/or diagnosis of SARS-CoV-2 by FDA under an Emergency Use Authorization (EUA). This EUA will remain  in effect (meaning this test can be used) for the duration of the COVID-19 declaration under Section 564(b)(1) of the Act, 21 U.S.C.section 360bbb-3(b)(1), unless the authorization is terminated  or revoked sooner.       Influenza A by PCR NEGATIVE NEGATIVE Final   Influenza B by PCR NEGATIVE NEGATIVE Final    Comment: (NOTE) The Xpert Xpress SARS-CoV-2/FLU/RSV plus assay is intended as an aid in the diagnosis of influenza from Nasopharyngeal swab specimens and should not be used as a sole basis for treatment. Nasal washings and aspirates are unacceptable for Xpert Xpress  SARS-CoV-2/FLU/RSV testing.  Fact Sheet for Patients: EntrepreneurPulse.com.au  Fact Sheet for Healthcare Providers: IncredibleEmployment.be  This test is not yet approved or cleared by the [place] FDA and has been authorized for detection and/or diagnosis of SARS-CoV-2 by FDA under an Emergency Use Authorization (EUA). This EUA will remain in effect (meaning this test can be used) for the duration of the COVID-19 declaration under Section 564(b)(1) of the Act, 21 U.S.C. section 360bbb-3(b)(1), unless the authorization is terminated or revoked.  Performed at [facility], [place] [address], [place], [place] [address]      Labs: BNP (last 3 results) Recent Labs    07/22/20 1624 11/19/20 0955  BNP 347.4* 952.8*   Basic Metabolic Panel: Recent Labs  Lab 11/19/20 0955 11/19/20 1042 11/19/20 1050 11/20/20 0121 11/21/20 0344  NA 139 138  --  139 137  K 4.3 4.3  --  4.0 3.3*  CL 102  --   --  96* 91*  CO2 29  --   --  31 38*  GLUCOSE 170*  --   --  125* 135*  BUN 21  --   --  21 30*  CREATININE 0.93  --   --  0.97 1.09*  CALCIUM 9.6  --   --  9.3 9.1  MG  --   --  2.0  --   --  Liver Function Tests: Recent Labs  Lab 11/19/20 0955  AST 22  ALT 28  ALKPHOS 78  BILITOT 0.8  PROT 6.9  ALBUMIN 4.0   Recent Labs  Lab 11/19/20 0955  LIPASE 29   No results for input(s): AMMONIA in the last 168 hours. CBC: Recent Labs  Lab 11/19/20 0955 11/19/20 1042 11/20/20 0121 11/21/20 0344  WBC 14.6*  --  12.1* 8.1  NEUTROABS 12.1*  --   --   --   HGB 14.4 15.3* 14.1 12.6  HCT 47.5* 45.0 44.6 41.4  MCV 89.5  --  88.0 89.2  PLT 233  --  191 171   Cardiac Enzymes: No results for input(s): CKTOTAL, CKMB, CKMBINDEX, TROPONINI in the last 168 hours. BNP: Invalid input(s): POCBNP CBG: Recent Labs  Lab 11/20/20 0732 11/20/20 1150 11/20/20 1610 11/20/20 2123 11/21/20 0751  GLUCAP 161* 178* 129* 193* 136*    D-Dimer No results for input(s): DDIMER in the last 72 hours. Hgb A1c No results for input(s): HGBA1C in the last 72 hours. Lipid Profile No results for input(s): CHOL, HDL, LDLCALC, TRIG, CHOLHDL, LDLDIRECT in the last 72 hours. Thyroid function studies No results for input(s): TSH, T4TOTAL, T3FREE, THYROIDAB in the last 72 hours.  Invalid input(s): FREET3 Anemia work up No results for input(s): VITAMINB12, FOLATE, FERRITIN, TIBC, IRON, RETICCTPCT in the last 72 hours. Urinalysis    Component Value Date/Time   COLORURINE STRAW (A) 11/19/2020 1930   APPEARANCEUR CLEAR 11/19/2020 1930   LABSPEC 1.008 11/19/2020 1930   PHURINE 5.0 11/19/2020 1930   GLUCOSEU NEGATIVE 11/19/2020 1930   HGBUR NEGATIVE 11/19/2020 1930   BILIRUBINUR NEGATIVE 11/19/2020 1930   KETONESUR NEGATIVE 11/19/2020 1930   PROTEINUR NEGATIVE 11/19/2020 1930   UROBILINOGEN 0.2 03/20/2011 2257   NITRITE NEGATIVE 11/19/2020 1930   LEUKOCYTESUR NEGATIVE 11/19/2020 1930   Sepsis Labs Invalid input(s): PROCALCITONIN,  WBC,  LACTICIDVEN Microbiology Recent Results (from the past 240 hour(s))  SARS CORONAVIRUS 2 (TAT 6-24 HRS) Nasopharyngeal Nasopharyngeal Swab     Status: None   Collection Time: 11/16/20  1:02 PM   Specimen: Nasopharyngeal Swab  Result Value Ref Range Status   SARS Coronavirus 2 NEGATIVE NEGATIVE Final    Comment: (NOTE) SARS-CoV-2 target nucleic acids are NOT DETECTED.  The SARS-CoV-2 RNA is generally detectable in upper and lower respiratory specimens during the acute phase of infection. Negative results do not preclude SARS-CoV-2 infection, do not rule out co-infections with other pathogens, and should not be used as the sole basis for treatment or other patient management decisions. Negative results must be combined with clinical observations, patient history, and epidemiological information. The expected result is Negative.  Fact Sheet for  Patients: SugarRoll.be  Fact Sheet for Healthcare Providers: https://www.woods-mathews.com/  This test is not yet approved or cleared by the [place] FDA and  has been authorized for detection and/or diagnosis of SARS-CoV-2 by FDA under an Emergency Use Authorization (EUA). This EUA will remain  in effect (meaning this test can be used) for the duration of the COVID-19 declaration under Se ction 564(b)(1) of the Act, 21 U.S.C. section 360bbb-3(b)(1), unless the authorization is terminated or revoked sooner.  Performed at [facility], [place] [address] [place], [place] [address]   Resp Panel by RT-PCR (Flu A&B, Covid) Nasopharyngeal Swab     Status: None   Collection Time: 11/19/20  9:55 AM   Specimen: Nasopharyngeal Swab; Nasopharyngeal(NP) swabs in vial transport medium  Result Value Ref Range Status  SARS Coronavirus 2 by RT PCR NEGATIVE NEGATIVE Final    Comment: (NOTE) SARS-CoV-2 target nucleic acids are NOT DETECTED.  The SARS-CoV-2 RNA is generally detectable in upper respiratory specimens during the acute phase of infection. The lowest concentration of SARS-CoV-2 viral copies this assay can detect is 138 copies/mL. A negative result does not preclude SARS-Cov-2 infection and should not be used as the sole basis for treatment or other patient management decisions. A negative result may occur with  improper specimen collection/handling, submission of specimen other than nasopharyngeal swab, presence of viral mutation(s) within the areas targeted by this assay, and inadequate number of viral copies(<138 copies/mL). A negative result must be combined with clinical observations, patient history, and epidemiological information. The expected result is Negative.  Fact Sheet for Patients:  EntrepreneurPulse.com.au  Fact Sheet for Healthcare Providers:  IncredibleEmployment.be  This test is  no t yet approved or cleared by the [place] FDA and  has been authorized for detection and/or diagnosis of SARS-CoV-2 by FDA under an Emergency Use Authorization (EUA). This EUA will remain  in effect (meaning this test can be used) for the duration of the COVID-19 declaration under Section 564(b)(1) of the Act, 21 U.S.C.section 360bbb-3(b)(1), unless the authorization is terminated  or revoked sooner.       Influenza A by PCR NEGATIVE NEGATIVE Final   Influenza B by PCR NEGATIVE NEGATIVE Final    Comment: (NOTE) The Xpert Xpress SARS-CoV-2/FLU/RSV plus assay is intended as an aid in the diagnosis of influenza from Nasopharyngeal swab specimens and should not be used as a sole basis for treatment. Nasal washings and aspirates are unacceptable for Xpert Xpress SARS-CoV-2/FLU/RSV testing.  Fact Sheet for Patients: EntrepreneurPulse.com.au  Fact Sheet for Healthcare Providers: IncredibleEmployment.be  This test is not yet approved or cleared by the [place] FDA and has been authorized for detection and/or diagnosis of SARS-CoV-2 by FDA under an Emergency Use Authorization (EUA). This EUA will remain in effect (meaning this test can be used) for the duration of the COVID-19 declaration under Section 564(b)(1) of the Act, 21 U.S.C. section 360bbb-3(b)(1), unless the authorization is terminated or revoked.  Performed at [facility], [place] [address] [place], [place] [address]      Patient was seen and examined on the day of discharge and was found to be in stable condition. Time coordinating discharge: 25 minutes including assessment and coordination of care, as well as examination of the patient.   SIGNED:  Dessa Phi, DO [facility] 11/21/2020, 9:02 AM

## 2020-11-23 ENCOUNTER — Other Ambulatory Visit: Payer: Self-pay

## 2020-11-23 ENCOUNTER — Encounter: Payer: Self-pay | Admitting: Cardiology

## 2020-11-23 ENCOUNTER — Ambulatory Visit (INDEPENDENT_AMBULATORY_CARE_PROVIDER_SITE_OTHER): Payer: Medicare PPO | Admitting: Cardiology

## 2020-11-23 VITALS — BP 130/80 | HR 76 | Ht 64.0 in | Wt 144.0 lb

## 2020-11-23 DIAGNOSIS — I5032 Chronic diastolic (congestive) heart failure: Secondary | ICD-10-CM

## 2020-11-23 DIAGNOSIS — Z95818 Presence of other cardiac implants and grafts: Secondary | ICD-10-CM | POA: Diagnosis not present

## 2020-11-23 DIAGNOSIS — I4821 Permanent atrial fibrillation: Secondary | ICD-10-CM | POA: Diagnosis not present

## 2020-11-23 NOTE — Progress Notes (Signed)
Cardiology Office Note:    Date:  11/23/2020   ID:  MARYCLARE [name], DOB [birth year], MRN [id number]  PCP:  Aretta Nip, MD   [place]  Cardiologist:  Candee Furbish, MD  Advanced Practice Provider:  No care team member to display Electrophysiologist:  Vickie Epley, MD       Referring MD: Leighton Ruff, MD     History of Present Illness:    [name] is a 80 y.o. female here for post hospital follow-up.  She had postconversion acute on chronic diastolic heart failure contributed by withholding diuretic therapy.  And catecholamine surge related to cardioversion.  She also had IV fluids during procedure.  Lasix was administered for pulmonary edema.  She remained in normal rhythm in the ER seen by Dr. Tamala Julian in cardiology team.  Also sees Dr. Quentin Ore with electrophysiology.  She previously had failed cardioversion prior to amiodarone.  27 mm watchman present.  EF 65%.  Today, her EKG demonstrates atrial fibrillation/flutter once again heart rate 73 bpm.  Once again this was with amiodarone fully loaded post cardioversion.  See below for details.  Past Medical History:  Diagnosis Date  . Acute GI bleeding 05/13/2015  . Anxiety   . Arthritis   . Asthma   . Atrial fibrillation ([place]) 10/29/2017  . Bakers cyst, left   . Chronic kidney disease    stage 3  . Constipation   . Diabetes mellitus    type 2  . Dysrhythmia   . GERD (gastroesophageal reflux disease)   . Heart murmur   . History of cellulitis   . History of dizziness   . History of hiatal hernia    tiny 06/15/2016  . Hypertension   . Iron deficiency anemia   . Peripheral vascular disease ([place])   . PONV (postoperative nausea and vomiting)   . Pulmonary nodule     Past Surgical History:  Procedure Laterality Date  . APPENDECTOMY  1952  . BIOPSY  10/16/2018   Procedure: BIOPSY;  Surgeon: Clarene Essex, MD;  Location: [facility];  Service: Endoscopy;;  . BIOPSY   11/26/2019   Procedure: BIOPSY;  Surgeon: Clarene Essex, MD;  Location: [facility];  Service: Endoscopy;;  . BIOPSY  07/14/2020   Procedure: BIOPSY;  Surgeon: Ronald Lobo, MD;  Location: [facility];  Service: Endoscopy;;  . CARDIOVERSION N/A 09/09/2020   Procedure: CARDIOVERSION;  Surgeon: Josue Hector, MD;  Location: [facility]COPY;  Service: Cardiovascular;  Laterality: N/A;  . CARDIOVERSION N/A 11/18/2020   Procedure: CARDIOVERSION;  Surgeon: Donato Heinz, MD;  Location: [facility];  Service: Cardiovascular;  Laterality: N/A;  . COLONOSCOPY W/ POLYPECTOMY  05/15/2013  . ESOPHAGOGASTRODUODENOSCOPY (EGD) WITH PROPOFOL N/A 06/15/2016   Procedure: ESOPHAGOGASTRODUODENOSCOPY (EGD) WITH PROPOFOL;  Surgeon: Clarene Essex, MD;  Location: [facility];  Service: Endoscopy;  Laterality: N/A;  . ESOPHAGOGASTRODUODENOSCOPY (EGD) WITH PROPOFOL N/A 10/16/2018   Procedure: ESOPHAGOGASTRODUODENOSCOPY (EGD) WITH PROPOFOL;  Surgeon: Clarene Essex, MD;  [facility];  Service: Endoscopy;  Laterality: N/A;  . ESOPHAGOGASTRODUODENOSCOPY (EGD) WITH PROPOFOL N/A 11/26/2019   Procedure: ESOPHAGOGASTRODUODENOSCOPY (EGD) WITH PROPOFOL;  Surgeon: Clarene Essex, MD;  Location: [facility];  Service: Endoscopy;  Laterality: N/A;  . ESOPHAGOGASTRODUODENOSCOPY (EGD) WITH PROPOFOL N/A 07/14/2020   Procedure: ESOPHAGOGASTRODUODENOSCOPY (EGD) WITH PROPOFOL;  Surgeon: Ronald Lobo, MD;  Location: [facility];  Service: Endoscopy;  Laterality: N/A;  . EYE SURGERY Bilateral    cataracts removed  . GI RADIOFREQUENCY ABLATION  10/16/2018  Procedure: GI RADIOFREQUENCY ABLATION;  Surgeon: Clarene Essex, MD;  Location: [facility];  Service: Endoscopy;;  . GI RADIOFREQUENCY ABLATION N/A 11/26/2019   Procedure: GI RADIOFREQUENCY ABLATION;  Surgeon: Clarene Essex, MD;  Location: [facility];  Service: Endoscopy;  Laterality: N/A;  . HAND SURGERY Left 2010   operated on index and ring finger  . HOT HEMOSTASIS N/A  07/14/2020   Procedure: HOT HEMOSTASIS (ARGON PLASMA COAGULATION/BICAP);  Surgeon: Ronald Lobo, MD;  Location: [facility];  Service: Endoscopy;  Laterality: N/A;  . INCONTINENCE SURGERY    . JOINT REPLACEMENT Right    knee  . KNEE ARTHROSCOPY  2003 2005   right knee  . KYPHOPLASTY N/A 01/29/2020   Procedure: T8 KYPHOPLASTY;  Surgeon: Melina Schools, MD;  Location: [place] [facility];  Service: Orthopedics;  Laterality: N/A;  60 mins Local with [facility]  . LEFT ATRIAL APPENDAGE OCCLUSION N/A 07/01/2020   Procedure: LEFT ATRIAL APPENDAGE OCCLUSION;  Surgeon: Vickie Epley, MD;  Location: [facility];  Service: Cardiovascular;  Laterality: N/A;  . mass fallopian tube    . SPINAL CORD STIMULATOR BATTERY EXCHANGE N/A 09/09/2015   Procedure: SPINAL CORD STIMULATOR BATTERY EXCHANGE;  Surgeon: Melina Schools, MD;  Location: [facility];  Service: Orthopedics;  Laterality: N/A;  . TEE WITHOUT CARDIOVERSION N/A 08/18/2020   Procedure: TRANSESOPHAGEAL ECHOCARDIOGRAM (TEE);  Surgeon: Josue Hector, MD;  Location: [facility] ENDOSCOPY;  Service: Cardiovascular;  Laterality: N/A;  . TEE WITHOUT CARDIOVERSION N/A 09/09/2020   Procedure: TRANSESOPHAGEAL ECHOCARDIOGRAM (TEE);  Surgeon: Josue Hector, MD;  Location[facility] END[facility]COPY;  Service: Cardiovascular;  Laterality: N/A;  . TEE WITHOUT CARDIOVERSION N/A 11/18/2020   Procedure: TRANSESOPHAGEAL ECHOCARDIOGRAM (TEE);  Surgeon: Donato Heinz, MD;  Location: [facility] END[facility]COPY;  Service: Cardiovascular;  Laterality: N/A;  . TOTAL KNEE ARTHROPLASTY Left 11/05/2017   Procedure: LEFT TOTAL KNEE ARTHROPLASTY;  Surgeon: Paralee Cancel, MD;  Location: [facility];  Service: Orthopedics;  Laterality: Left;  Adductor Block  . VAGINAL HYSTERECTOMY  1977   1 ovary removed    Current Medications: Current Meds  Medication Sig  . acetaminophen (TYLENOL) 500 MG tablet Take 1,000 mg by mouth every 8 (eight) hours as needed for mild pain, moderate pain or headache.   . albuterol  (PROAIR HFA) 108 (90 BASE) MCG/ACT inhaler Inhale 2 puffs into the lungs every 4 (four) hours as needed for wheezing or shortness of breath.  . ALPRAZolam (XANAX) 0.25 MG tablet Take 0.25 mg by mouth at bedtime.   . clopidogrel (PLAVIX) 75 MG tablet Take 75 mg by mouth daily.  . Continuous Blood Gluc Sensor (FREESTYLE LIBRE 14 DAY SENSOR) MISC Inject 1 patch into the skin every 14 (fourteen) days.  . diphenhydrAMINE (BENADRYL) 25 MG tablet Take 25 mg by mouth daily as needed for allergies.  Marland Kitchen docusate sodium (COLACE) 100 MG capsule Take 100 mg by mouth daily as needed for moderate constipation.  . DROPLET PEN NEEDLES 32G X 4 MM MISC   . fluticasone (FLONASE) 50 MCG/ACT nasal spray Place 1 spray into both nostrils daily as needed for allergies.   . furosemide (LASIX) 40 MG tablet Take 1 tablet (40 mg total) by mouth daily.  Marland Kitchen gabapentin (NEURONTIN) 300 MG capsule Take 600 mg by mouth in the morning, at noon, and at bedtime.   . insulin glargine (LANTUS) 100 UNIT/ML Solostar Pen Inject 24 Units into the skin at bedtime.  . Iron-FA-B Cmp-C-Biot-Probiotic (FUSION PLUS) CAPS Take 1 capsule by mouth every evening.   Marland Kitchen  magnesium oxide (MAG-OX) 400 (241.3 Mg) MG tablet Take 0.5 tablets (200 mg total) by mouth 2 (two) times daily.  . metoprolol tartrate (LOPRESSOR) 100 MG tablet Take 1 tablet (100 mg total) by mouth 2 (two) times daily.  . Multiple Vitamins-Minerals (PRESERVISION AREDS 2 PO) Take 1 capsule by mouth 2 (two) times daily.   Marland Kitchen NOVOLOG FLEXPEN 100 UNIT/ML FlexPen Inject 8-12 Units into the skin See admin instructions. Inject 8-12 units into the skin, PER SLIDING SCALE, in the morning, noon, and bedtime  . OXYGEN Inhale 2.5 L/min into the lungs as needed (for shortness of breath).  . pantoprazole (PROTONIX) 40 MG tablet Take 40 mg by mouth daily.  . Pitavastatin Calcium (LIVALO) 1 MG TABS Take 1 mg by mouth at bedtime.  . potassium chloride SA (KLOR-CON) 20 MEQ tablet Take 2 tablets (40 mEq  total) by mouth daily.  . sodium chloride (OCEAN) 0.65 % SOLN nasal spray Place 1 spray into both nostrils as needed for congestion.  Marland Kitchen venlafaxine (EFFEXOR) 37.5 MG tablet Take 37.5 mg by mouth 2 (two) times daily.  . [DISCONTINUED] amiodarone (PACERONE) 200 MG tablet Take 200 mg by mouth daily.     Allergies:   Vasotec, Atorvastatin, Codeine, Cymbalta [duloxetine hcl], Lansoprazole, Morphine and related, Sulfate, Amitriptyline hcl, Escitalopram oxalate, Iron, Sertraline hcl, Tramadol, Adhesive [tape], Allevyn adhesive [wound dressings], and Scopolamine   Social History   Socioeconomic History  . Marital status: Married    Spouse name: Charlotte Crumb  . Number of children: 2  . Years of education: 62  . Highest education level: Not on file  Occupational History    Comment: retired Pharmacist, hospital, IT sales professional  Tobacco Use  . Smoking status: Never Smoker  . Smokeless tobacco: Never Used  Vaping Use  . Vaping Use: Never used  Substance and Sexual Activity  . Alcohol use: No  . Drug use: No  . Sexual activity: Not Currently    Birth control/protection: Surgical  Other Topics Concern  . Not on file  Social History Narrative   Lives with husband   Caffeine use- none   Social Determinants of Health   Financial Resource Strain: Not on file  Food Insecurity: Not on file  Transportation Needs: Not on file  Physical Activity: Not on file  Stress: Not on file  Social Connections: Not on file     Family History: The patient's family history includes Heart attack in her father; Lung cancer in her mother. There is no history of Colon cancer, Stroke, or Migraines.  ROS:   Please see the history of present illness.    No fevers chills nausea vomiting shortness of breath.  All other systems reviewed and are negative.  EKGs/Labs/Other Studies Reviewed:    The following studies were reviewed today: EF normal  EKG:  EKG is  ordered today.  The ekg ordered today demonstrates coarse atrial  fibrillation/flutter 73 bpm.  Recent Labs: 07/22/2020: TSH 0.549 11/19/2020: ALT 28; B Natriuretic Peptide 257.1; Magnesium 2.0 11/21/2020: BUN 30; Creatinine, Ser 1.09; Hemoglobin 12.6; Platelets 171; Potassium 3.3; Sodium 137  Recent Lipid Panel No results found for: CHOL, TRIG, HDL, CHOLHDL, VLDL, LDLCALC, LDLDIRECT   Risk Assessment/Calculations:      Physical Exam:    VS:  BP 130/80 (BP Location: Left Arm, Patient Position: Sitting, Cuff Size: Normal)   Pulse 76   Ht 5\' 4"  (1.626 m)   Wt 144 lb (65.3 kg)   SpO2 95%   BMI 24.72 kg/m  Wt Readings from Last 3 Encounters:  11/23/20 144 lb (65.3 kg)  11/21/20 140 lb 14 oz (63.9 kg)  11/18/20 147 lb (66.7 kg)     GEN:  Well nourished, well developed in no acute distress HEENT: Normal NECK: No JVD; No carotid bruits LYMPHATICS: No lymphadenopathy CARDIAC: irreg irreg, no murmurs, rubs, gallops RESPIRATORY:  Clear to auscultation without rales, wheezing or rhonchi  ABDOMEN: Soft, non-tender, non-distended MUSCULOSKELETAL:  No edema; No deformity  SKIN: Warm and dry NEUROLOGIC:  Alert and oriented x 3 PSYCHIATRIC:  Normal affect   ASSESSMENT:    1. Permanent atrial fibrillation ([place])   2. Presence of Watchman left atrial appendage closure device   3. Chronic heart failure with preserved ejection fraction (HCC)    PLAN:    In order of problems listed above:  Permanent atrial fibrillation -Her EKG shows atrial fibrillation/flutter heart rate 73 bpm, well controlled.  Early return of atrial fibrillation.  She was sufficiently loaded with amiodarone prior to her last cardioversion. Early return of atrial fibrillation. -Continue with metoprolol 100 mg twice a day. -We will stop amiodarone since she has failed attempt at rhythm control.  Her last cardioversion with amiodarone unfortunately resulted in brief hospitalization, pulmonary edema.  She is not interested in another cardioversion.  I am not sure how successful  this would be regardless.  Lets go ahead and pursue rate control strategy. -  Dr. Quentin Ore.  Watchman device. On Plavix for 6 months.  Easy bruising noted. -She is still feeling low energy.  She did not really feel much different post cardioversion.  Encouraged her to exercise.  Last hemoglobin 15.3, creatinine 0.93, potassium 4.3, TSH 0.5, ALT 28.  LDL 95.  Acute on chronic diastolic heart failure in the setting of post cardioversion -Did much better after Lasix was continued.  Lasix 40 mg a day.  I would like for her to continue with daily Lasix.  Watchman device -Eventually will be able to come off of the Plavix which is 6 months after implant.  He is demonstrating some bruising.  Hyperlipidemia -Livalo 1 mg at bedtime.  Time spent on encounter 41 minutes including review of medical records, EKGs, hospital records, echocardiogram review, discussion with she and her husband.  She has follow-up in about 2 months with Dr. Quentin Ore.  She will have follow-up with me in 6 months.   Medication Adjustments/Labs and Tests Ordered: Current medicines are reviewed at length with the patient today.  Concerns regarding medicines are outlined above.  No orders of the defined types were placed in this encounter.  No orders of the defined types were placed in this encounter.   Patient Instructions  Medication Instructions:  Please discontinue your Amiodarone. Continue all other medications as listed.  *If you need a refill on your cardiac medications before your next appointment, please call your pharmacy*  Follow-Up: At [facility], you and your health needs are our priority.  As part of our continuing mission to provide you with exceptional heart care, we have created designated Provider Care Teams.  These Care Teams include your primary Cardiologist (physician) and Advanced Practice Providers (APPs -  Physician Assistants and Nurse Practitioners) who all work together to provide you with the  care you need, when you need it.  We recommend signing up for the patient portal called "MyChart".  Sign up information is provided on this After Visit Summary.  MyChart is used to connect with patients for Virtual Visits (Telemedicine).  Patients are able  to view lab/test results, encounter notes, upcoming appointments, etc.  Non-urgent messages can be sent to your provider as well.   To learn more about what you can do with MyChart, go to [email or web address].    Your next appointment:   6 month(s)  The format for your next appointment:   In Person  Provider:   Candee Furbish, MD  Follow up with Dr Quentin Ore as scheduled.  Thank you for choosing [facility]!!         Signed, Candee Furbish, MD  11/23/2020 10:44 AM    [place]

## 2020-11-23 NOTE — Patient Instructions (Addendum)
Medication Instructions:  Please discontinue your Amiodarone. Continue all other medications as listed.  *If you need a refill on your cardiac medications before your next appointment, please call your pharmacy*  Follow-Up: At [facility], you and your health needs are our priority.  As part of our continuing mission to provide you with exceptional heart care, we have created designated Provider Care Teams.  These Care Teams include your primary Cardiologist (physician) and Advanced Practice Providers (APPs -  Physician Assistants and Nurse Practitioners) who all work together to provide you with the care you need, when you need it.  We recommend signing up for the patient portal called "MyChart".  Sign up information is provided on this After Visit Summary.  MyChart is used to connect with patients for Virtual Visits (Telemedicine).  Patients are able to view lab/test results, encounter notes, upcoming appointments, etc.  Non-urgent messages can be sent to your provider as well.   To learn more about what you can do with MyChart, go to [email or web address].    Your next appointment:   6 month(s)  The format for your next appointment:   In Person  Provider:   Candee Furbish, MD  Follow up with Dr Quentin Ore as scheduled.  Thank you for choosing [facility]!!

## 2020-11-24 DIAGNOSIS — E113299 Type 2 diabetes mellitus with mild nonproliferative diabetic retinopathy without macular edema, unspecified eye: Secondary | ICD-10-CM | POA: Diagnosis not present

## 2020-11-24 DIAGNOSIS — N183 Chronic kidney disease, stage 3 unspecified: Secondary | ICD-10-CM | POA: Diagnosis not present

## 2020-11-24 DIAGNOSIS — F339 Major depressive disorder, recurrent, unspecified: Secondary | ICD-10-CM | POA: Diagnosis not present

## 2020-11-24 DIAGNOSIS — I509 Heart failure, unspecified: Secondary | ICD-10-CM | POA: Diagnosis not present

## 2020-11-24 DIAGNOSIS — I130 Hypertensive heart and chronic kidney disease with heart failure and stage 1 through stage 4 chronic kidney disease, or unspecified chronic kidney disease: Secondary | ICD-10-CM | POA: Diagnosis not present

## 2020-11-24 DIAGNOSIS — E1122 Type 2 diabetes mellitus with diabetic chronic kidney disease: Secondary | ICD-10-CM | POA: Diagnosis not present

## 2020-11-24 DIAGNOSIS — E1142 Type 2 diabetes mellitus with diabetic polyneuropathy: Secondary | ICD-10-CM | POA: Diagnosis not present

## 2020-11-24 DIAGNOSIS — I4819 Other persistent atrial fibrillation: Secondary | ICD-10-CM | POA: Diagnosis not present

## 2020-11-24 DIAGNOSIS — D631 Anemia in chronic kidney disease: Secondary | ICD-10-CM | POA: Diagnosis not present

## 2020-11-25 DIAGNOSIS — E1142 Type 2 diabetes mellitus with diabetic polyneuropathy: Secondary | ICD-10-CM | POA: Diagnosis not present

## 2020-11-25 DIAGNOSIS — D631 Anemia in chronic kidney disease: Secondary | ICD-10-CM | POA: Diagnosis not present

## 2020-11-25 DIAGNOSIS — N183 Chronic kidney disease, stage 3 unspecified: Secondary | ICD-10-CM | POA: Diagnosis not present

## 2020-11-25 DIAGNOSIS — I130 Hypertensive heart and chronic kidney disease with heart failure and stage 1 through stage 4 chronic kidney disease, or unspecified chronic kidney disease: Secondary | ICD-10-CM | POA: Diagnosis not present

## 2020-11-25 DIAGNOSIS — I509 Heart failure, unspecified: Secondary | ICD-10-CM | POA: Diagnosis not present

## 2020-11-25 DIAGNOSIS — F339 Major depressive disorder, recurrent, unspecified: Secondary | ICD-10-CM | POA: Diagnosis not present

## 2020-11-25 DIAGNOSIS — I4819 Other persistent atrial fibrillation: Secondary | ICD-10-CM | POA: Diagnosis not present

## 2020-11-25 DIAGNOSIS — E1122 Type 2 diabetes mellitus with diabetic chronic kidney disease: Secondary | ICD-10-CM | POA: Diagnosis not present

## 2020-11-25 DIAGNOSIS — E113299 Type 2 diabetes mellitus with mild nonproliferative diabetic retinopathy without macular edema, unspecified eye: Secondary | ICD-10-CM | POA: Diagnosis not present

## 2020-11-26 DIAGNOSIS — M950 Acquired deformity of nose: Secondary | ICD-10-CM | POA: Diagnosis not present

## 2020-11-26 DIAGNOSIS — Z87828 Personal history of other (healed) physical injury and trauma: Secondary | ICD-10-CM | POA: Diagnosis not present

## 2020-11-26 DIAGNOSIS — H61303 Acquired stenosis of external ear canal, unspecified, bilateral: Secondary | ICD-10-CM | POA: Diagnosis not present

## 2020-11-26 DIAGNOSIS — J300 Vasomotor rhinitis: Secondary | ICD-10-CM | POA: Diagnosis not present

## 2020-11-26 DIAGNOSIS — H6123 Impacted cerumen, bilateral: Secondary | ICD-10-CM | POA: Diagnosis not present

## 2020-11-26 DIAGNOSIS — S022XXD Fracture of nasal bones, subsequent encounter for fracture with routine healing: Secondary | ICD-10-CM | POA: Diagnosis not present

## 2020-11-26 DIAGNOSIS — J3489 Other specified disorders of nose and nasal sinuses: Secondary | ICD-10-CM | POA: Diagnosis not present

## 2020-11-26 DIAGNOSIS — J342 Deviated nasal septum: Secondary | ICD-10-CM | POA: Diagnosis not present

## 2020-11-26 NOTE — Addendum Note (Signed)
Addended by: Maren Beach, Prinston Kynard A on: 11/26/2020 10:00 AM   Modules accepted: Orders

## 2020-12-06 ENCOUNTER — Other Ambulatory Visit: Payer: Self-pay

## 2020-12-06 ENCOUNTER — Ambulatory Visit (INDEPENDENT_AMBULATORY_CARE_PROVIDER_SITE_OTHER): Payer: Medicare PPO | Admitting: Podiatry

## 2020-12-06 DIAGNOSIS — E1142 Type 2 diabetes mellitus with diabetic polyneuropathy: Secondary | ICD-10-CM

## 2020-12-06 DIAGNOSIS — E1169 Type 2 diabetes mellitus with other specified complication: Secondary | ICD-10-CM

## 2020-12-06 DIAGNOSIS — R5381 Other malaise: Secondary | ICD-10-CM | POA: Insufficient documentation

## 2020-12-06 DIAGNOSIS — B351 Tinea unguium: Secondary | ICD-10-CM

## 2020-12-06 NOTE — Progress Notes (Signed)
  Subjective:  Patient ID: [name], female    DOB: February 24, 1941,  MRN: [id number]  Chief Complaint  Patient presents with  . debride    DFC -FBVS: 139 a1C: 6.6 PCP: [facility] x 6 wks   80 y.o. female presents with the above complaint. History confirmed with patient.   Objective:  Physical Exam: warm, good capillary refill, onychomycosis of the toenails with pain to palpation of the nailbeds, no trophic changes or ulcerative lesions, normal DP and PT pulses, and absent protective sensation. Xerosis bilat. Left Foot: normal exam, no swelling, tenderness, instability; ligaments intact, full range of motion of all ankle/foot joints  Right Foot: normal exam, no swelling, tenderness, instability; ligaments intact, full range of motion of all ankle/foot joints   Assessment:   1. Onychomycosis of multiple toenails with type 2 diabetes mellitus and peripheral neuropathy ([place])    Plan:  Patient was evaluated and treated and all questions answered.  Onychomycosis, Diabetes and DPN -Patient is diabetic with a qualifying condition for at risk foot care.   Procedure: Nail Debridement Type of Debridement: manual, sharp debridement. Instrumentation: Nail nipper, rotary burr. Number of Nails: 10  No follow-ups on file.

## 2020-12-07 DIAGNOSIS — I4819 Other persistent atrial fibrillation: Secondary | ICD-10-CM | POA: Diagnosis not present

## 2020-12-07 DIAGNOSIS — F339 Major depressive disorder, recurrent, unspecified: Secondary | ICD-10-CM | POA: Diagnosis not present

## 2020-12-07 DIAGNOSIS — I130 Hypertensive heart and chronic kidney disease with heart failure and stage 1 through stage 4 chronic kidney disease, or unspecified chronic kidney disease: Secondary | ICD-10-CM | POA: Diagnosis not present

## 2020-12-07 DIAGNOSIS — N183 Chronic kidney disease, stage 3 unspecified: Secondary | ICD-10-CM | POA: Diagnosis not present

## 2020-12-07 DIAGNOSIS — J3489 Other specified disorders of nose and nasal sinuses: Secondary | ICD-10-CM | POA: Diagnosis not present

## 2020-12-07 DIAGNOSIS — E113299 Type 2 diabetes mellitus with mild nonproliferative diabetic retinopathy without macular edema, unspecified eye: Secondary | ICD-10-CM | POA: Diagnosis not present

## 2020-12-07 DIAGNOSIS — E1122 Type 2 diabetes mellitus with diabetic chronic kidney disease: Secondary | ICD-10-CM | POA: Diagnosis not present

## 2020-12-07 DIAGNOSIS — J342 Deviated nasal septum: Secondary | ICD-10-CM | POA: Diagnosis not present

## 2020-12-07 DIAGNOSIS — I509 Heart failure, unspecified: Secondary | ICD-10-CM | POA: Diagnosis not present

## 2020-12-07 DIAGNOSIS — D631 Anemia in chronic kidney disease: Secondary | ICD-10-CM | POA: Diagnosis not present

## 2020-12-07 DIAGNOSIS — E1142 Type 2 diabetes mellitus with diabetic polyneuropathy: Secondary | ICD-10-CM | POA: Diagnosis not present

## 2020-12-07 DIAGNOSIS — Z87828 Personal history of other (healed) physical injury and trauma: Secondary | ICD-10-CM | POA: Diagnosis not present

## 2020-12-07 DIAGNOSIS — S0992XA Unspecified injury of nose, initial encounter: Secondary | ICD-10-CM | POA: Diagnosis not present

## 2020-12-07 DIAGNOSIS — J320 Chronic maxillary sinusitis: Secondary | ICD-10-CM | POA: Diagnosis not present

## 2020-12-09 DIAGNOSIS — E1142 Type 2 diabetes mellitus with diabetic polyneuropathy: Secondary | ICD-10-CM | POA: Diagnosis not present

## 2020-12-13 DIAGNOSIS — I5020 Unspecified systolic (congestive) heart failure: Secondary | ICD-10-CM | POA: Diagnosis not present

## 2020-12-14 DIAGNOSIS — J342 Deviated nasal septum: Secondary | ICD-10-CM | POA: Diagnosis not present

## 2020-12-14 DIAGNOSIS — J300 Vasomotor rhinitis: Secondary | ICD-10-CM | POA: Diagnosis not present

## 2020-12-14 DIAGNOSIS — Z87828 Personal history of other (healed) physical injury and trauma: Secondary | ICD-10-CM | POA: Diagnosis not present

## 2020-12-16 DIAGNOSIS — E1142 Type 2 diabetes mellitus with diabetic polyneuropathy: Secondary | ICD-10-CM | POA: Diagnosis not present

## 2020-12-16 DIAGNOSIS — E1165 Type 2 diabetes mellitus with hyperglycemia: Secondary | ICD-10-CM | POA: Diagnosis not present

## 2020-12-16 DIAGNOSIS — Z794 Long term (current) use of insulin: Secondary | ICD-10-CM | POA: Diagnosis not present

## 2020-12-20 DIAGNOSIS — N183 Chronic kidney disease, stage 3 unspecified: Secondary | ICD-10-CM | POA: Diagnosis not present

## 2020-12-20 DIAGNOSIS — F339 Major depressive disorder, recurrent, unspecified: Secondary | ICD-10-CM | POA: Diagnosis not present

## 2020-12-20 DIAGNOSIS — E1122 Type 2 diabetes mellitus with diabetic chronic kidney disease: Secondary | ICD-10-CM | POA: Diagnosis not present

## 2020-12-20 DIAGNOSIS — D631 Anemia in chronic kidney disease: Secondary | ICD-10-CM | POA: Diagnosis not present

## 2020-12-20 DIAGNOSIS — I509 Heart failure, unspecified: Secondary | ICD-10-CM | POA: Diagnosis not present

## 2020-12-20 DIAGNOSIS — E1142 Type 2 diabetes mellitus with diabetic polyneuropathy: Secondary | ICD-10-CM | POA: Diagnosis not present

## 2020-12-20 DIAGNOSIS — I4819 Other persistent atrial fibrillation: Secondary | ICD-10-CM | POA: Diagnosis not present

## 2020-12-20 DIAGNOSIS — E113299 Type 2 diabetes mellitus with mild nonproliferative diabetic retinopathy without macular edema, unspecified eye: Secondary | ICD-10-CM | POA: Diagnosis not present

## 2020-12-20 DIAGNOSIS — I130 Hypertensive heart and chronic kidney disease with heart failure and stage 1 through stage 4 chronic kidney disease, or unspecified chronic kidney disease: Secondary | ICD-10-CM | POA: Diagnosis not present

## 2020-12-23 DIAGNOSIS — N183 Chronic kidney disease, stage 3 unspecified: Secondary | ICD-10-CM | POA: Diagnosis not present

## 2020-12-23 DIAGNOSIS — E1142 Type 2 diabetes mellitus with diabetic polyneuropathy: Secondary | ICD-10-CM | POA: Diagnosis not present

## 2020-12-23 DIAGNOSIS — I130 Hypertensive heart and chronic kidney disease with heart failure and stage 1 through stage 4 chronic kidney disease, or unspecified chronic kidney disease: Secondary | ICD-10-CM | POA: Diagnosis not present

## 2020-12-23 DIAGNOSIS — E1122 Type 2 diabetes mellitus with diabetic chronic kidney disease: Secondary | ICD-10-CM | POA: Diagnosis not present

## 2020-12-23 DIAGNOSIS — D631 Anemia in chronic kidney disease: Secondary | ICD-10-CM | POA: Diagnosis not present

## 2020-12-23 DIAGNOSIS — I4819 Other persistent atrial fibrillation: Secondary | ICD-10-CM | POA: Diagnosis not present

## 2020-12-23 DIAGNOSIS — E113299 Type 2 diabetes mellitus with mild nonproliferative diabetic retinopathy without macular edema, unspecified eye: Secondary | ICD-10-CM | POA: Diagnosis not present

## 2020-12-23 DIAGNOSIS — I509 Heart failure, unspecified: Secondary | ICD-10-CM | POA: Diagnosis not present

## 2020-12-23 DIAGNOSIS — F339 Major depressive disorder, recurrent, unspecified: Secondary | ICD-10-CM | POA: Diagnosis not present

## 2020-12-25 IMAGING — MG MM DIGITAL DIAGNOSTIC UNILAT*R* W/ TOMO W/ CAD
4 series · 4 of 12 positions shown · non-contrast
Comparison: Previous exam(s).

ACR Breast Density Category a: The breast tissue is almost entirely
fatty.

CLINICAL DATA: Recall from screening mammography with
tomosynthesis, possible mass involving the INNER RIGHT breast at
ANTERIOR to MIDDLE depth.

EXAM:
DIGITAL DIAGNOSTIC RIGHT MAMMOGRAM WITH TOMO
ULTRASOUND RIGHT BREAST

[R CC synth-2D]
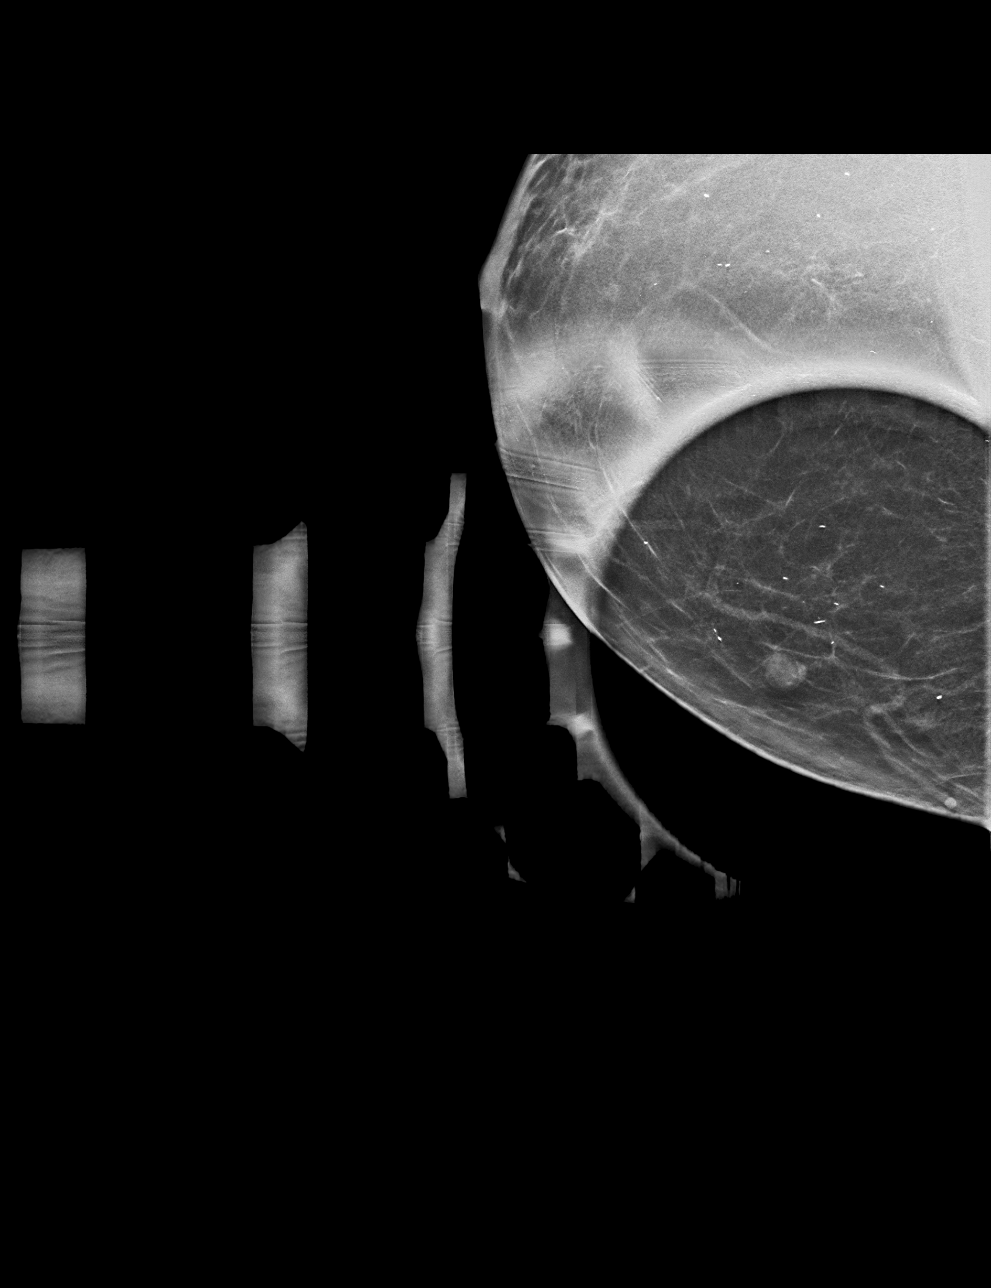

[R MLO synth-2D]
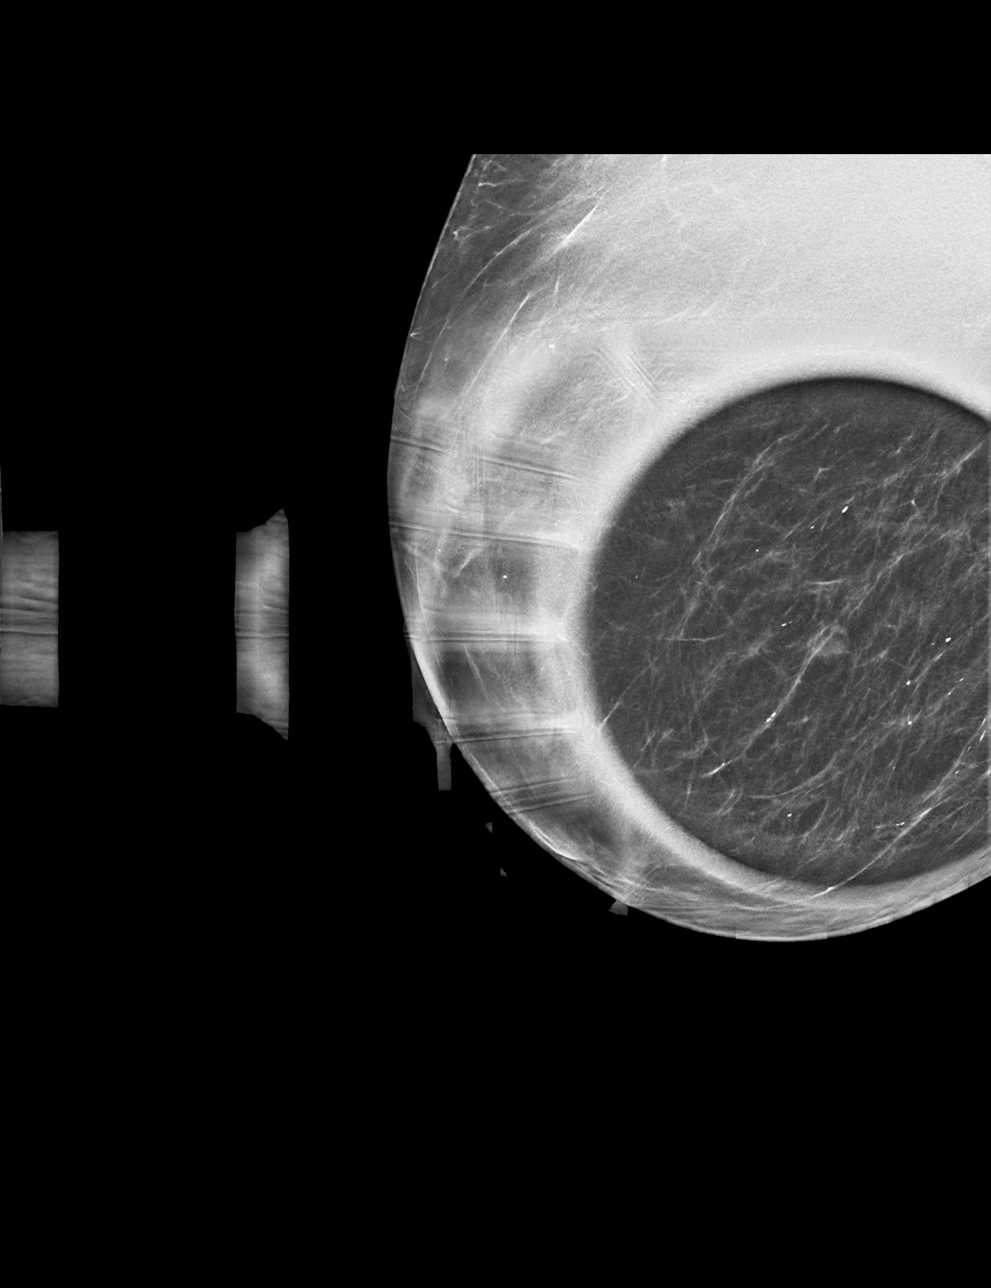

[R CC tomo · tomo slice 23/45.0]
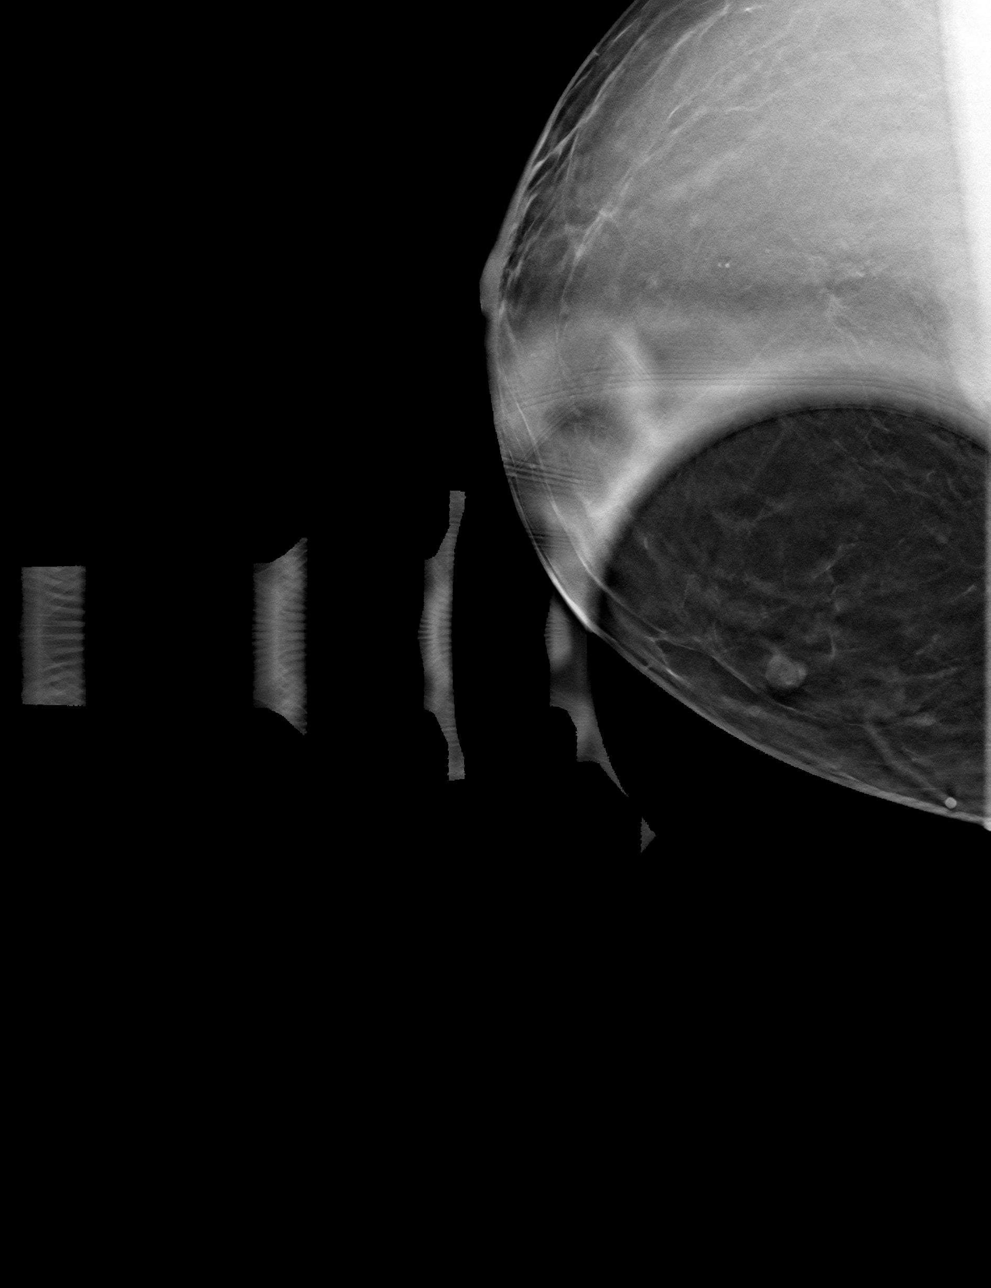

[R MLO tomo · tomo slice 31/62.0]
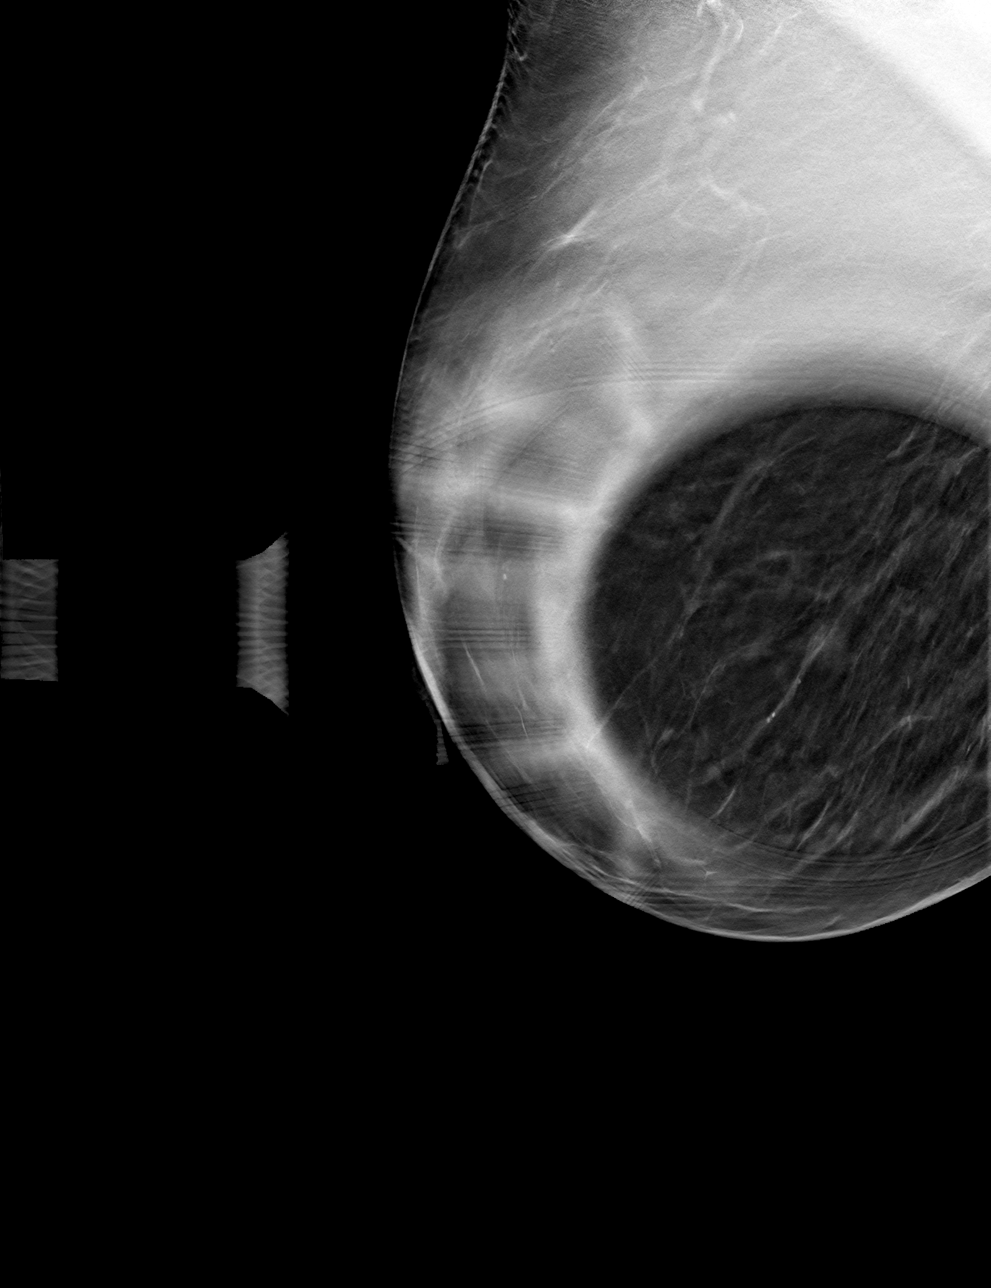

[4 of 12 positions shown; findings below may reference images not displayed]

FINDINGS: Tomosynthesis and synthesized digital 2D spot-compression CC and MLO
views of the area of concern in the RIGHT breast were obtained.

Spot compression images confirm a superficial circumscribed mixed
density mass in the INNER breast, containing isodense elements and
fat density elements, measuring approximately 7 mm. There is no
associated architectural distortion or suspicious calcifications.

On correlative physical exam, there is no palpable abnormality in
the INNER RIGHT breast.

Targeted RIGHT breast ultrasound is performed, showing a superficial
nearly round anechoic mass with a thin internal septation at the
2:30 o'clock position approximately 8 cm from the nipple measuring
approximately 6 x 5 x 6 mm, demonstrating mixed posterior
characteristics and demonstrating no internal power Doppler flow,
corresponding to the screening mammographic finding. No suspicious
solid mass is identified.
IMPRESSION: Benign fat necrosis/oil cyst in the INNER RIGHT breast at the 2:30
o'clock position approximately 8 cm from nipple which accounts for
the screening mammographic finding.

RECOMMENDATION:
Screening mammogram in one year.(Code:P9-6-6IJ)

I have discussed the findings and recommendations with the patient.
If applicable, a reminder letter will be sent to the patient
regarding the next appointment.

BI-RADS CATEGORY  2: Benign.

## 2020-12-30 DIAGNOSIS — I10 Essential (primary) hypertension: Secondary | ICD-10-CM | POA: Diagnosis not present

## 2020-12-30 DIAGNOSIS — E11319 Type 2 diabetes mellitus with unspecified diabetic retinopathy without macular edema: Secondary | ICD-10-CM | POA: Diagnosis not present

## 2020-12-30 DIAGNOSIS — R296 Repeated falls: Secondary | ICD-10-CM | POA: Diagnosis not present

## 2020-12-30 DIAGNOSIS — I4819 Other persistent atrial fibrillation: Secondary | ICD-10-CM | POA: Diagnosis not present

## 2020-12-30 DIAGNOSIS — R5381 Other malaise: Secondary | ICD-10-CM | POA: Diagnosis not present

## 2020-12-30 DIAGNOSIS — E7800 Pure hypercholesterolemia, unspecified: Secondary | ICD-10-CM | POA: Diagnosis not present

## 2021-01-05 DIAGNOSIS — I509 Heart failure, unspecified: Secondary | ICD-10-CM | POA: Diagnosis not present

## 2021-01-05 DIAGNOSIS — J45909 Unspecified asthma, uncomplicated: Secondary | ICD-10-CM | POA: Diagnosis not present

## 2021-01-05 DIAGNOSIS — E113299 Type 2 diabetes mellitus with mild nonproliferative diabetic retinopathy without macular edema, unspecified eye: Secondary | ICD-10-CM | POA: Diagnosis not present

## 2021-01-05 DIAGNOSIS — E1122 Type 2 diabetes mellitus with diabetic chronic kidney disease: Secondary | ICD-10-CM | POA: Diagnosis not present

## 2021-01-05 DIAGNOSIS — I2510 Atherosclerotic heart disease of native coronary artery without angina pectoris: Secondary | ICD-10-CM | POA: Diagnosis not present

## 2021-01-05 DIAGNOSIS — I130 Hypertensive heart and chronic kidney disease with heart failure and stage 1 through stage 4 chronic kidney disease, or unspecified chronic kidney disease: Secondary | ICD-10-CM | POA: Diagnosis not present

## 2021-01-05 DIAGNOSIS — I4819 Other persistent atrial fibrillation: Secondary | ICD-10-CM | POA: Diagnosis not present

## 2021-01-05 DIAGNOSIS — N183 Chronic kidney disease, stage 3 unspecified: Secondary | ICD-10-CM | POA: Diagnosis not present

## 2021-01-05 DIAGNOSIS — E1142 Type 2 diabetes mellitus with diabetic polyneuropathy: Secondary | ICD-10-CM | POA: Diagnosis not present

## 2021-01-12 DIAGNOSIS — E113299 Type 2 diabetes mellitus with mild nonproliferative diabetic retinopathy without macular edema, unspecified eye: Secondary | ICD-10-CM | POA: Diagnosis not present

## 2021-01-12 DIAGNOSIS — J45909 Unspecified asthma, uncomplicated: Secondary | ICD-10-CM | POA: Diagnosis not present

## 2021-01-12 DIAGNOSIS — I130 Hypertensive heart and chronic kidney disease with heart failure and stage 1 through stage 4 chronic kidney disease, or unspecified chronic kidney disease: Secondary | ICD-10-CM | POA: Diagnosis not present

## 2021-01-12 DIAGNOSIS — E1122 Type 2 diabetes mellitus with diabetic chronic kidney disease: Secondary | ICD-10-CM | POA: Diagnosis not present

## 2021-01-12 DIAGNOSIS — E1142 Type 2 diabetes mellitus with diabetic polyneuropathy: Secondary | ICD-10-CM | POA: Diagnosis not present

## 2021-01-12 DIAGNOSIS — I4819 Other persistent atrial fibrillation: Secondary | ICD-10-CM | POA: Diagnosis not present

## 2021-01-12 DIAGNOSIS — N183 Chronic kidney disease, stage 3 unspecified: Secondary | ICD-10-CM | POA: Diagnosis not present

## 2021-01-12 DIAGNOSIS — I509 Heart failure, unspecified: Secondary | ICD-10-CM | POA: Diagnosis not present

## 2021-01-12 DIAGNOSIS — I2510 Atherosclerotic heart disease of native coronary artery without angina pectoris: Secondary | ICD-10-CM | POA: Diagnosis not present

## 2021-01-12 DIAGNOSIS — I5020 Unspecified systolic (congestive) heart failure: Secondary | ICD-10-CM | POA: Diagnosis not present

## 2021-01-19 DIAGNOSIS — E1122 Type 2 diabetes mellitus with diabetic chronic kidney disease: Secondary | ICD-10-CM | POA: Diagnosis not present

## 2021-01-19 DIAGNOSIS — I509 Heart failure, unspecified: Secondary | ICD-10-CM | POA: Diagnosis not present

## 2021-01-19 DIAGNOSIS — N183 Chronic kidney disease, stage 3 unspecified: Secondary | ICD-10-CM | POA: Diagnosis not present

## 2021-01-19 DIAGNOSIS — J45909 Unspecified asthma, uncomplicated: Secondary | ICD-10-CM | POA: Diagnosis not present

## 2021-01-19 DIAGNOSIS — I4819 Other persistent atrial fibrillation: Secondary | ICD-10-CM | POA: Diagnosis not present

## 2021-01-19 DIAGNOSIS — E1142 Type 2 diabetes mellitus with diabetic polyneuropathy: Secondary | ICD-10-CM | POA: Diagnosis not present

## 2021-01-19 DIAGNOSIS — I130 Hypertensive heart and chronic kidney disease with heart failure and stage 1 through stage 4 chronic kidney disease, or unspecified chronic kidney disease: Secondary | ICD-10-CM | POA: Diagnosis not present

## 2021-01-19 DIAGNOSIS — I2510 Atherosclerotic heart disease of native coronary artery without angina pectoris: Secondary | ICD-10-CM | POA: Diagnosis not present

## 2021-01-19 DIAGNOSIS — E113299 Type 2 diabetes mellitus with mild nonproliferative diabetic retinopathy without macular edema, unspecified eye: Secondary | ICD-10-CM | POA: Diagnosis not present

## 2021-02-01 DIAGNOSIS — N183 Chronic kidney disease, stage 3 unspecified: Secondary | ICD-10-CM | POA: Diagnosis not present

## 2021-02-01 DIAGNOSIS — E1122 Type 2 diabetes mellitus with diabetic chronic kidney disease: Secondary | ICD-10-CM | POA: Diagnosis not present

## 2021-02-01 DIAGNOSIS — E113299 Type 2 diabetes mellitus with mild nonproliferative diabetic retinopathy without macular edema, unspecified eye: Secondary | ICD-10-CM | POA: Diagnosis not present

## 2021-02-01 DIAGNOSIS — E1142 Type 2 diabetes mellitus with diabetic polyneuropathy: Secondary | ICD-10-CM | POA: Diagnosis not present

## 2021-02-01 DIAGNOSIS — I2510 Atherosclerotic heart disease of native coronary artery without angina pectoris: Secondary | ICD-10-CM | POA: Diagnosis not present

## 2021-02-01 DIAGNOSIS — J45909 Unspecified asthma, uncomplicated: Secondary | ICD-10-CM | POA: Diagnosis not present

## 2021-02-01 DIAGNOSIS — I4819 Other persistent atrial fibrillation: Secondary | ICD-10-CM | POA: Diagnosis not present

## 2021-02-01 DIAGNOSIS — I130 Hypertensive heart and chronic kidney disease with heart failure and stage 1 through stage 4 chronic kidney disease, or unspecified chronic kidney disease: Secondary | ICD-10-CM | POA: Diagnosis not present

## 2021-02-01 DIAGNOSIS — I509 Heart failure, unspecified: Secondary | ICD-10-CM | POA: Diagnosis not present

## 2021-02-10 NOTE — Progress Notes (Signed)
Electrophysiology Office Follow up Visit Note:    Date:  02/11/2021   ID:  [name], DOB 1941/06/25, MRN [id number]  PCP:  Nancy Nip, MD  [place] Cardiologist:  Candee Furbish, MD  [facility] Electrophysiologist:  Vickie Epley, MD    Interval History:    [name] is a 80 y.o. female who presents for a follow up visit.  I last saw the patient November 09, 2020 for her persistent atrial fibrillation.  She had a watchman implanted July 01, 2020.  Her transesophageal echo on November 18, 2020 showed a well-seated left atrial appendage occlusion device without leak or thrombus.  She said no trouble with bleeding while taking Plavix.  She has experienced a couple of falls recently when she gets up at night she is unsteady.  Her husband asks her to wake him up so he can walk with her to the restroom but on the nights of her falls she tried to go by her self.  I have encouraged her to wake him up or use a walker at night.    Past Medical History:  Diagnosis Date   Acute GI bleeding 05/13/2015   Anxiety    Arthritis    Asthma    Atrial fibrillation ([place]) 10/29/2017   Bakers cyst, left    Chronic kidney disease    stage 3   Constipation    Diabetes mellitus    type 2   Dysrhythmia    GERD (gastroesophageal reflux disease)    Heart murmur    History of cellulitis    History of dizziness    History of hiatal hernia    tiny 06/15/2016   Hypertension    Iron deficiency anemia    Peripheral vascular disease (HCC)    PONV (postoperative nausea and vomiting)    Pulmonary nodule     Past Surgical History:  Procedure Laterality Date   APPENDECTOMY  1952   BIOPSY  10/16/2018   Procedure: BIOPSY;  Surgeon: Clarene Essex, MD;  Location: [facility];  Service: Endoscopy;;   BIOPSY  11/26/2019   Procedure: BIOPSY;  Surgeon: Clarene Essex, MD;  [facility]: [facility];  Service: Endoscopy;;   BIOPSY  07/14/2020   Procedure: BIOPSY;  Surgeon: Ronald Lobo, MD;   [facility];  Service: Endoscopy;;   CARDIOVERSION N/A 09/09/2020   Procedure: CARDIOVERSION;  Surgeon: Josue Hector, MD;  Location: [place];  Service: Cardiovascular;  Laterality: N/A;   CARDIOVERSION N/A 11/18/2020   Procedure: CARDIOVERSION;  Surgeon: Donato Heinz, MD;  Location: [place];  Service: Cardiovascular;  Laterality: N/A;   COLONOSCOPY W/ POLYPECTOMY  05/15/2013   ESOPHAGOGASTRODUODENOSCOPY (EGD) WITH PROPOFOL N/A 06/15/2016   Procedure: ESOPHAGOGASTRODUODENOSCOPY (EGD) WITH PROPOFOL;  Surgeon: Clarene Essex, MD;  [facility];  Service: Endoscopy;  Laterality: N/A;   ESOPHAGOGASTRODUODENOSCOPY (EGD) WITH PROPOFOL N/A 10/16/2018   Procedure: ESOPHAGOGASTRODUODENOSCOPY (EGD) WITH PROPOFOL;  Surgeon: Clarene Essex, MD;  [facility];  Service: Endoscopy;  Laterality: N/A;   ESOPHAGOGASTRODUODENOSCOPY (EGD) WITH PROPOFOL N/A 11/26/2019   Procedure: ESOPHAGOGASTRODUODENOSCOPY (EGD) WITH PROPOFOL;  Surgeon: Clarene Essex, MD;  [facility];  Service: Endoscopy;  Laterality: N/A;   ESOPHAGOGASTRODUODENOSCOPY (EGD) WITH PROPOFOL N/A 07/14/2020   Procedure: ESOPHAGOGASTRODUODENOSCOPY (EGD) WITH PROPOFOL;  Surgeon: Ronald Lobo, MD;  [facility];  Service: Endoscopy;  Laterality: N/A;   EYE SURGERY Bilateral    cataracts removed   GI RADIOFREQUENCY ABLATION  10/16/2018   Procedure: GI RADIOFREQUENCY ABLATION;  Surgeon:  Clarene Essex, MD;  Location: [facility];  Service: Endoscopy;;   GI RADIOFREQUENCY ABLATION N/A 11/26/2019   Procedure: GI RADIOFREQUENCY ABLATION;  Surgeon: Clarene Essex, MD;  [facility];  Service: Endoscopy;  Laterality: N/A;   HAND SURGERY Left 2010   operated on index and ring finger   HOT HEMOSTASIS N/A 07/14/2020   Procedure: HOT HEMOSTASIS (ARGON PLASMA COAGULATION/BICAP);  Surgeon: Ronald Lobo, MD;  Location: [facility];  Service: Endoscopy;  Laterality: N/A;   INCONTINENCE SURGERY      JOINT REPLACEMENT Right    knee   KNEE ARTHROSCOPY  2003 2005   right knee   KYPHOPLASTY N/A 01/29/2020   Procedure: T8 KYPHOPLASTY;  Surgeon: Melina Schools, MD;  Location: [place];  Service: Orthopedics;  Laterality: N/A;  60 mins Local with [facility]   LEFT ATRIAL APPENDAGE OCCLUSION N/A 07/01/2020   Procedure: LEFT ATRIAL APPENDAGE OCCLUSION;  Surgeon: Vickie Epley, MD;  Location: [place] [facility];  Service: Cardiovascular;  Laterality: N/A;   mass fallopian tube     SPINAL CORD STIMULATOR BATTERY EXCHANGE N/A 09/09/2015   Procedure: SPINAL CORD STIMULATOR BATTERY EXCHANGE;  Surgeon: Melina Schools, MD;  Location: [place];  Service: Orthopedics;  Laterality: N/A;   TEE WITHOUT CARDIOVERSION N/A 08/18/2020   Procedure: TRANSESOPHAGEAL ECHOCARDIOGRAM (TEE);  Surgeon: Josue Hector, MD;  Location[facility];  Service: Cardiovascular;  Laterality: N/A;   TEE WITHOUT CARDIOVERSION N/A 09/09/2020   Procedure: TRANSESOPHAGEAL ECHOCARDIOGRAM (TEE);  Surgeon: Josue Hector, MD;  Location: [facility];  Service: Cardiovascular;  Laterality: N/A;   TEE WITHOUT CARDIOVERSION N/A 11/18/2020   Procedure: TRANSESOPHAGEAL ECHOCARDIOGRAM (TEE);  Surgeon: Donato Heinz, MD;  Location: [place];  Service: Cardiovascular;  Laterality: N/A;   TOTAL KNEE ARTHROPLASTY Left 11/05/2017   Procedure: LEFT TOTAL KNEE ARTHROPLASTY;  Surgeon: Paralee Cancel, MD;  [facility]: [facility];  Service: Orthopedics;  Laterality: Left;  Adductor Block   VAGINAL HYSTERECTOMY  1977   1 ovary removed    Current Medications: Current Meds  Medication Sig   acetaminophen (TYLENOL) 500 MG tablet Take 1,000 mg by mouth every 8 (eight) hours as needed for mild pain, moderate pain or headache.    albuterol (PROAIR HFA) 108 (90 BASE) MCG/ACT inhaler Inhale 2 puffs into the lungs every 4 (four) hours as needed for wheezing or shortness of breath.   ALPRAZolam (XANAX) 0.25 MG tablet Take 0.25 mg by mouth at bedtime.     aspirin EC 81 MG tablet Take 1 tablet (81 mg total) by mouth daily. Swallow whole.   Continuous Blood Gluc Sensor (FREESTYLE LIBRE 14 DAY SENSOR) MISC Inject 1 patch into the skin every 14 (fourteen) days.   diphenhydrAMINE (BENADRYL) 25 MG tablet Take 25 mg by mouth daily as needed for allergies.   docusate sodium (COLACE) 100 MG capsule Take 100 mg by mouth daily as needed for moderate constipation.   DROPLET PEN NEEDLES 32G X 4 MM MISC    fluticasone (FLONASE) 50 MCG/ACT nasal spray Place 1 spray into both nostrils daily as needed for allergies.    furosemide (LASIX) 40 MG tablet Take 1 tablet (40 mg total) by mouth daily.   gabapentin (NEURONTIN) 300 MG capsule Take 600 mg by mouth in the morning, at noon, and at bedtime.    insulin glargine (LANTUS) 100 UNIT/ML Solostar Pen Inject 24 Units into the skin at bedtime.   Iron-FA-B Cmp-C-Biot-Probiotic (FUSION PLUS) CAPS Take 1 capsule by mouth every evening.  magnesium oxide (MAG-OX) 400 (241.3 Mg) MG tablet Take 0.5 tablets (200 mg total) by mouth 2 (two) times daily.   metoprolol tartrate (LOPRESSOR) 100 MG tablet Take 1 tablet (100 mg total) by mouth 2 (two) times daily.   Multiple Vitamins-Minerals (PRESERVISION AREDS 2 PO) Take 1 capsule by mouth 2 (two) times daily.    NOVOLOG FLEXPEN 100 UNIT/ML FlexPen Inject 8-12 Units into the skin See admin instructions. Inject 8-12 units into the skin, PER SLIDING SCALE, in the morning, noon, and bedtime   OXYGEN Inhale 2.5 L/min into the lungs as needed (for shortness of breath).   pantoprazole (PROTONIX) 40 MG tablet Take 40 mg by mouth daily.   Pitavastatin Calcium (LIVALO) 1 MG TABS Take 2 mg by mouth at bedtime.   potassium chloride SA (KLOR-CON) 20 MEQ tablet Take 2 tablets (40 mEq total) by mouth daily.   sodium chloride (OCEAN) 0.65 % SOLN nasal spray Place 1 spray into both nostrils as needed for congestion.   venlafaxine (EFFEXOR) 37.5 MG tablet Take 37.5 mg by mouth 2 (two) times  daily.   [DISCONTINUED] clopidogrel (PLAVIX) 75 MG tablet Take 75 mg by mouth daily.     Allergies:   Vasotec, Atorvastatin, Codeine, Cymbalta [duloxetine hcl], Lansoprazole, Morphine and related, Sulfate, Amitriptyline hcl, Escitalopram oxalate, Iron, Sertraline hcl, Tramadol, Adhesive [tape], Allevyn adhesive [wound dressings], and Scopolamine   Social History   Socioeconomic History   Marital status: Married    Spouse name: Johnny   Number of children: 2   Years of education: 16   Highest education level: Not on file  Occupational History    Comment: retired Pharmacist, hospital, IT sales professional  Tobacco Use   Smoking status: Never   Smokeless tobacco: Never  Scientific laboratory technician Use: Never used  Substance and Sexual Activity   Alcohol use: No   Drug use: No   Sexual activity: Not Currently    Birth control/protection: Surgical  Other Topics Concern   Not on file  Social History Narrative   Lives with husband   Caffeine use- none   Social Determinants of Health   Financial Resource Strain: Not on file  Food Insecurity: Not on file  Transportation Needs: Not on file  Physical Activity: Not on file  Stress: Not on file  Social Connections: Not on file     Family History: The patient's family history includes Heart attack in her father; Lung cancer in her mother. There is no history of Colon cancer, Stroke, or Migraines.  ROS:   Please see the history of present illness.    All other systems reviewed and are negative.  EKGs/Labs/Other Studies Reviewed:    The following studies were reviewed today:   11/18/2020 TEE 1. Left ventricular ejection fraction, by estimation, is 55 to 60%. The  left ventricle has normal function.   2. Right ventricular systolic function is normal. The right ventricular  size is normal. There is mildly elevated pulmonary artery systolic  pressure.   3. A small pericardial effusion is present.   4. Right atrial size was moderately dilated.   5. The  mitral valve is normal in structure. Mild mitral valve  regurgitation.   6. The aortic valve is tricuspid. Aortic valve regurgitation is not  visualized.   7. Tricuspid valve regurgitation is moderate.   8. S/p Watchman LAA occlusion device, appears well sealed. No thrombus  seen. Left atrial size was moderately dilated.  6  EKG:  The ekg ordered today demonstrates  sinus rhythm with a ventricular rate of 62 bpm.  Recent Labs: 07/22/2020: TSH 0.549 11/19/2020: ALT 28; B Natriuretic Peptide 257.1; Magnesium 2.0 11/21/2020: BUN 30; Creatinine, Ser 1.09; Hemoglobin 12.6; Platelets 171; Potassium 3.3; Sodium 137  Recent Lipid Panel No results found for: CHOL, TRIG, HDL, CHOLHDL, VLDL, LDLCALC, LDLDIRECT  Physical Exam:    VS:  BP 124/72   Pulse 62   Ht 5\' 4"  (1.626 m)   Wt 142 lb (64.4 kg)   BMI 24.37 kg/m     Wt Readings from Last 3 Encounters:  02/11/21 142 lb (64.4 kg)  11/23/20 144 lb (65.3 kg)  11/21/20 140 lb 14 oz (63.9 kg)     GEN:  Well nourished, well developed in no acute distress HEENT: Normal NECK: No JVD; No carotid bruits LYMPHATICS: No lymphadenopathy CARDIAC: RRR, no murmurs, rubs, gallops RESPIRATORY:  Clear to auscultation without rales, wheezing or rhonchi  ABDOMEN: Soft, non-tender, non-distended MUSCULOSKELETAL:  No edema; No deformity  SKIN: Warm and dry NEUROLOGIC:  Alert and oriented x 3 PSYCHIATRIC:  Normal affect   ASSESSMENT:    1. Permanent atrial fibrillation ([place])   2. Presence of Watchman left atrial appendage closure device   3. Chronic heart failure with preserved ejection fraction ([place])   4. Essential hypertension    PLAN:    In order of problems listed above:  Persistent atrial fibrillation In sinus rhythm today.  Currently taking Plavix 75 mg once daily but I think she can stop that now that we are 6 months out from the [place] implant.  I will ask her to transition to an enteric-coated aspirin 81 mg by mouth once daily.  I will  plan to see her back in 6 months which will be 1 year after implant. 2.  Presence of watchman device Transition to aspirin 81 mg by mouth once daily today  3.  Chronic heart failure with preserved ejection fraction NYHA class II today.  Warm and dry.  Continue Lasix, metoprolol  4.  Hypertension Controlled.  Continue above therapy.  Follow-up 6 months.      Medication Adjustments/Labs and Tests Ordered: Current medicines are reviewed at length with the patient today.  Concerns regarding medicines are outlined above.  Orders Placed This Encounter  Procedures   EKG 12-Lead   Meds ordered this encounter  Medications   aspirin EC 81 MG tablet    Sig: Take 1 tablet (81 mg total) by mouth daily. Swallow whole.    Dispense:  90 tablet    Refill:  3     Signed, Lars Mage, MD, [facility], [facility] 02/11/2021 12:22 PM    Electrophysiology [facility]

## 2021-02-11 ENCOUNTER — Ambulatory Visit (INDEPENDENT_AMBULATORY_CARE_PROVIDER_SITE_OTHER): Payer: Medicare PPO | Admitting: Cardiology

## 2021-02-11 ENCOUNTER — Other Ambulatory Visit: Payer: Self-pay

## 2021-02-11 VITALS — BP 124/72 | HR 62 | Ht 64.0 in | Wt 142.0 lb

## 2021-02-11 DIAGNOSIS — I5032 Chronic diastolic (congestive) heart failure: Secondary | ICD-10-CM | POA: Diagnosis not present

## 2021-02-11 DIAGNOSIS — Z95818 Presence of other cardiac implants and grafts: Secondary | ICD-10-CM

## 2021-02-11 DIAGNOSIS — I10 Essential (primary) hypertension: Secondary | ICD-10-CM | POA: Diagnosis not present

## 2021-02-11 DIAGNOSIS — I4821 Permanent atrial fibrillation: Secondary | ICD-10-CM | POA: Diagnosis not present

## 2021-02-11 MED ORDER — ASPIRIN EC 81 MG PO TBEC: 81.0000 mg | DELAYED_RELEASE_TABLET | Freq: Every day | ORAL | 3 refills | Status: AC

## 2021-02-11 NOTE — Patient Instructions (Signed)
Medication Instructions:  Your physician has recommended you make the following change in your medication:  1.) stop clopidogrel (Plavix) 2.) start aspirin (enteric coated) 81 mg daily *If you need a refill on your cardiac medications before your next appointment, please call your pharmacy*   Lab Work: none If you have labs (blood work) drawn today and your tests are completely normal, you will receive your results only by: [place] (if you have MyChart) OR A paper copy in the mail If you have any lab test that is abnormal or we need to change your treatment, we will call you to review the results.   Testing/Procedures: none   Follow-Up: At [facility], you and your health needs are our priority.  As part of our continuing mission to provide you with exceptional heart care, we have created designated Provider Care Teams.  These Care Teams include your primary Cardiologist (physician) and Advanced Practice Providers (APPs -  Physician Assistants and Nurse Practitioners) who all work together to provide you with the care you need, when you need it.   Your next appointment:   6 month(s)  The format for your next appointment:   In Person  Provider:   You may see Vickie Epley, MD or one of the following Advanced Practice Providers on your designated Care Team:    Tommye Standard, [place] Legrand Como "Jonni Sanger" Chalmers Cater, [place]   Other Instructions

## 2021-02-12 DIAGNOSIS — I5020 Unspecified systolic (congestive) heart failure: Secondary | ICD-10-CM | POA: Diagnosis not present

## 2021-02-17 DIAGNOSIS — I509 Heart failure, unspecified: Secondary | ICD-10-CM | POA: Diagnosis not present

## 2021-02-17 DIAGNOSIS — E113299 Type 2 diabetes mellitus with mild nonproliferative diabetic retinopathy without macular edema, unspecified eye: Secondary | ICD-10-CM | POA: Diagnosis not present

## 2021-02-17 DIAGNOSIS — N183 Chronic kidney disease, stage 3 unspecified: Secondary | ICD-10-CM | POA: Diagnosis not present

## 2021-02-17 DIAGNOSIS — I4819 Other persistent atrial fibrillation: Secondary | ICD-10-CM | POA: Diagnosis not present

## 2021-02-17 DIAGNOSIS — E1122 Type 2 diabetes mellitus with diabetic chronic kidney disease: Secondary | ICD-10-CM | POA: Diagnosis not present

## 2021-02-17 DIAGNOSIS — E1142 Type 2 diabetes mellitus with diabetic polyneuropathy: Secondary | ICD-10-CM | POA: Diagnosis not present

## 2021-02-17 DIAGNOSIS — J45909 Unspecified asthma, uncomplicated: Secondary | ICD-10-CM | POA: Diagnosis not present

## 2021-02-17 DIAGNOSIS — I2510 Atherosclerotic heart disease of native coronary artery without angina pectoris: Secondary | ICD-10-CM | POA: Diagnosis not present

## 2021-02-17 DIAGNOSIS — I130 Hypertensive heart and chronic kidney disease with heart failure and stage 1 through stage 4 chronic kidney disease, or unspecified chronic kidney disease: Secondary | ICD-10-CM | POA: Diagnosis not present

## 2021-03-01 DIAGNOSIS — E1142 Type 2 diabetes mellitus with diabetic polyneuropathy: Secondary | ICD-10-CM | POA: Diagnosis not present

## 2021-03-02 ENCOUNTER — Other Ambulatory Visit: Payer: Self-pay

## 2021-03-02 ENCOUNTER — Other Ambulatory Visit: Payer: Self-pay | Admitting: Cardiology

## 2021-03-07 ENCOUNTER — Other Ambulatory Visit: Payer: Self-pay

## 2021-03-07 ENCOUNTER — Other Ambulatory Visit: Payer: Self-pay | Admitting: Podiatry

## 2021-03-07 ENCOUNTER — Ambulatory Visit (INDEPENDENT_AMBULATORY_CARE_PROVIDER_SITE_OTHER): Payer: Medicare PPO | Admitting: Podiatry

## 2021-03-07 ENCOUNTER — Ambulatory Visit (INDEPENDENT_AMBULATORY_CARE_PROVIDER_SITE_OTHER): Payer: Medicare PPO

## 2021-03-07 DIAGNOSIS — E1169 Type 2 diabetes mellitus with other specified complication: Secondary | ICD-10-CM

## 2021-03-07 DIAGNOSIS — M19072 Primary osteoarthritis, left ankle and foot: Secondary | ICD-10-CM | POA: Diagnosis not present

## 2021-03-07 DIAGNOSIS — B351 Tinea unguium: Secondary | ICD-10-CM

## 2021-03-07 DIAGNOSIS — E1142 Type 2 diabetes mellitus with diabetic polyneuropathy: Secondary | ICD-10-CM | POA: Diagnosis not present

## 2021-03-07 DIAGNOSIS — M19079 Primary osteoarthritis, unspecified ankle and foot: Secondary | ICD-10-CM

## 2021-03-07 DIAGNOSIS — M79672 Pain in left foot: Secondary | ICD-10-CM

## 2021-03-07 MED ORDER — DEXAMETHASONE SODIUM PHOSPHATE 120 MG/30ML IJ SOLN
2.0000 mg | Freq: Once | INTRAMUSCULAR | Status: AC
  Administered 2021-03-07: 2 mg via INTRA_ARTICULAR

## 2021-03-07 NOTE — Progress Notes (Signed)
  Subjective:  Patient ID: [name], female    DOB: Jan 22, 1941,  MRN: [id number]  Chief Complaint  Patient presents with   debride    [facility] -[facility]: 128 a1`C: 6.6 PCP: [place] x [place]   Pain    Lt dorsal midfoot pain x several mo -no injury - wearing different shoes with no help - 3/10 tenderness tx: tylenol    80 y.o. female presents with the above complaint. History confirmed with patient.   Objective:  Physical Exam: warm, good capillary refill, onychomycosis of the toenails with pain to palpation of the nailbeds, no trophic changes or ulcerative lesions, normal DP and PT pulses, and absent protective sensation. Xerosis bilat. Left Foot: POP left dorsal midfoto at the TMTJs. Right Foot: normal exam, no swelling, tenderness, instability; ligaments intact, full range of motion of all ankle/foot joints   Assessment:   1. Arthritis of midfoot   2. Onychomycosis of multiple toenails with type 2 diabetes mellitus and peripheral neuropathy ([place])    Plan:  Patient was evaluated and treated and all questions answered.  Onychomycosis, Diabetes and DPN -Patient is diabetic with a qualifying condition for at risk foot care.   Procedure: Nail Debridement Type of Debridement: manual, sharp debridement. Instrumentation: Nail nipper, rotary burr. Number of Nails: 10  Arthritis -Educated on etiology -XR reviewed with patient -Injection delivered to the painful joint  Procedure: Joint Injection Location: Left dorsal 2nd TMT joint Skin Prep: Alcohol. Injectate: 0.5 cc 1% lidocaine plain, 0.5 cc betamethasone acetate-betamethasone sodium phosphate Disposition: Patient tolerated procedure well. Injection site dressed with a band-aid.    Return in about 3 months (around 06/07/2021).

## 2021-03-09 DIAGNOSIS — H0102A Squamous blepharitis right eye, upper and lower eyelids: Secondary | ICD-10-CM | POA: Diagnosis not present

## 2021-03-09 DIAGNOSIS — H1045 Other chronic allergic conjunctivitis: Secondary | ICD-10-CM | POA: Diagnosis not present

## 2021-03-09 DIAGNOSIS — H11123 Conjunctival concretions, bilateral: Secondary | ICD-10-CM | POA: Diagnosis not present

## 2021-03-09 DIAGNOSIS — H0102B Squamous blepharitis left eye, upper and lower eyelids: Secondary | ICD-10-CM | POA: Diagnosis not present

## 2021-03-14 DIAGNOSIS — I5020 Unspecified systolic (congestive) heart failure: Secondary | ICD-10-CM | POA: Diagnosis not present

## 2021-03-17 DIAGNOSIS — Z794 Long term (current) use of insulin: Secondary | ICD-10-CM | POA: Diagnosis not present

## 2021-03-17 DIAGNOSIS — E1142 Type 2 diabetes mellitus with diabetic polyneuropathy: Secondary | ICD-10-CM | POA: Diagnosis not present

## 2021-03-22 DIAGNOSIS — M19041 Primary osteoarthritis, right hand: Secondary | ICD-10-CM | POA: Diagnosis not present

## 2021-03-22 DIAGNOSIS — M79641 Pain in right hand: Secondary | ICD-10-CM | POA: Diagnosis not present

## 2021-04-09 ENCOUNTER — Other Ambulatory Visit: Payer: Self-pay | Admitting: Cardiology

## 2021-04-13 DIAGNOSIS — M13849 Other specified arthritis, unspecified hand: Secondary | ICD-10-CM | POA: Diagnosis not present

## 2021-04-13 DIAGNOSIS — E1142 Type 2 diabetes mellitus with diabetic polyneuropathy: Secondary | ICD-10-CM | POA: Diagnosis not present

## 2021-04-14 DIAGNOSIS — M19049 Primary osteoarthritis, unspecified hand: Secondary | ICD-10-CM | POA: Insufficient documentation

## 2021-04-27 ENCOUNTER — Telehealth: Payer: Self-pay | Admitting: *Deleted

## 2021-04-27 NOTE — Telephone Encounter (Signed)
   [facility] Pre-operative Risk Assessment    Patient Name: [name]  DOB: 11/04/1940 MRN: [id number]  HEARTCARE STAFF:  - IMPORTANT!!!!!! Under Visit Info/Reason for Call, type in Other and utilize the format Clearance MM/DD/YY or Clearance TBD. Do not use dashes or single digits. - Please review there is not already an duplicate clearance open for this procedure. - If request is for dental extraction, please clarify the # of teeth to be extracted. - If the patient is currently at the dentist's office, call Pre-Op Callback Staff (MA/nurse) to input urgent request.  - If the patient is not currently in the dentist office, please route to the Pre-Op pool.  Request for surgical clearance:  What type of surgery is being performed? RIGHT INDEX FINGER PROXIMAL INTERPHALANGEAL JOINT FUSION  When is this surgery scheduled? TBD  What type of clearance is required (medical clearance vs. Pharmacy clearance to hold med vs. Both)? MEDICAL  Are there any medications that need to be held prior to surgery and how long?  ASA  Practice name and name of physician performing surgery? [facility]; DR. Roseanne Kaufman  What is the office phone number? [phone]   7.   What is the office fax number? [facility]  8.   Anesthesia type (None, local, MAC, general) ? ANESTHESIA-BLOCK WITH IV SEDATION   Julaine Hua 04/27/2021, 1:32 PM  _________________________________________________________________   (provider comments below)

## 2021-04-28 NOTE — Telephone Encounter (Signed)
Hi Dr. Marlou Porch,  [name] has upcoming hand surgery planned and surgeon is asking for recommendations on holding Aspirin. Patient has a history of permanent atrial fibrillation s/p Watchman procedure in 06/2020. No known CAD or stroke. Can you please comment on how long Aspirin can be held?  Please route response back to P CV DIV PREOP.  Thank you! [name]

## 2021-04-29 NOTE — Telephone Encounter (Signed)
   Primary Cardiologist: Candee Furbish, MD  Chart reviewed as part of pre-operative protocol coverage. Given past medical history and time since last visit, based on ACC/AHA guidelines, [name] would be at acceptable risk for the planned procedure without further cardiovascular testing.   Her aspirin may be held for 5 - 7 days prior to her surgery.  Please resume as soon as hemostasis is achieved.  I will route this recommendation to the requesting party via Epic fax function and remove from pre-op pool.  Please call with questions.  Jossie Ng. Shabre Kreher NP-C    04/29/2021, 8:28 AM [address] Office [phone] Fax [phone]

## 2021-05-04 DIAGNOSIS — R21 Rash and other nonspecific skin eruption: Secondary | ICD-10-CM | POA: Diagnosis not present

## 2021-05-10 DIAGNOSIS — I110 Hypertensive heart disease with heart failure: Secondary | ICD-10-CM | POA: Diagnosis not present

## 2021-05-10 DIAGNOSIS — D6869 Other thrombophilia: Secondary | ICD-10-CM | POA: Diagnosis not present

## 2021-05-10 DIAGNOSIS — I509 Heart failure, unspecified: Secondary | ICD-10-CM | POA: Diagnosis not present

## 2021-05-10 DIAGNOSIS — I4891 Unspecified atrial fibrillation: Secondary | ICD-10-CM | POA: Diagnosis not present

## 2021-05-10 DIAGNOSIS — J45909 Unspecified asthma, uncomplicated: Secondary | ICD-10-CM | POA: Diagnosis not present

## 2021-05-10 DIAGNOSIS — E1142 Type 2 diabetes mellitus with diabetic polyneuropathy: Secondary | ICD-10-CM | POA: Diagnosis not present

## 2021-05-10 DIAGNOSIS — E785 Hyperlipidemia, unspecified: Secondary | ICD-10-CM | POA: Diagnosis not present

## 2021-05-10 DIAGNOSIS — Z794 Long term (current) use of insulin: Secondary | ICD-10-CM | POA: Diagnosis not present

## 2021-05-10 DIAGNOSIS — F329 Major depressive disorder, single episode, unspecified: Secondary | ICD-10-CM | POA: Diagnosis not present

## 2021-05-20 DIAGNOSIS — E1142 Type 2 diabetes mellitus with diabetic polyneuropathy: Secondary | ICD-10-CM | POA: Diagnosis not present

## 2021-05-27 ENCOUNTER — Other Ambulatory Visit: Payer: Self-pay

## 2021-05-27 ENCOUNTER — Ambulatory Visit (INDEPENDENT_AMBULATORY_CARE_PROVIDER_SITE_OTHER): Payer: Medicare PPO | Admitting: Cardiology

## 2021-05-27 ENCOUNTER — Encounter: Payer: Self-pay | Admitting: Cardiology

## 2021-05-27 DIAGNOSIS — Z95818 Presence of other cardiac implants and grafts: Secondary | ICD-10-CM

## 2021-05-27 DIAGNOSIS — E7800 Pure hypercholesterolemia, unspecified: Secondary | ICD-10-CM | POA: Diagnosis not present

## 2021-05-27 DIAGNOSIS — I480 Paroxysmal atrial fibrillation: Secondary | ICD-10-CM

## 2021-05-27 NOTE — Assessment & Plan Note (Signed)
Dr. Quentin Ore placed watchman device.  Done quite well with this.  She is excited.  No changes made.

## 2021-05-27 NOTE — Patient Instructions (Signed)
Medication Instructions:  The current medical regimen is effective;  continue present plan and medications.  *If you need a refill on your cardiac medications before your next appointment, please call your pharmacy*  Follow-Up: At [facility], you and your health needs are our priority.  As part of our continuing mission to provide you with exceptional heart care, we have created designated Provider Care Teams.  These Care Teams include your primary Cardiologist (physician) and Advanced Practice Providers (APPs -  Physician Assistants and Nurse Practitioners) who all work together to provide you with the care you need, when you need it.  We recommend signing up for the patient portal called "MyChart".  Sign up information is provided on this After Visit Summary.  MyChart is used to connect with patients for Virtual Visits (Telemedicine).  Patients are able to view lab/test results, encounter notes, upcoming appointments, etc.  Non-urgent messages can be sent to your provider as well.   To learn more about what you can do with MyChart, go to [email or web address].com.    Your next appointment:   1 year(s)  The format for your next appointment:   In Person  Provider:   Mark Skains, MD   Thank you for choosing [facility]!!    

## 2021-05-27 NOTE — Assessment & Plan Note (Signed)
Interestingly, on exam today she does sound quite regular.  I did not do an EKG.  Previously she was in atrial fibrillation/atrial flutter well rate controlled.  Continue with current medical management metoprolol 100 mg twice a day.  We have stopped amiodarone previously.  She is failed previous attempts at rhythm control.  Her last cardioversion with amiodarone resulted in brief hospitalization pulmonary edema.  She is not interested in another cardioversion.

## 2021-05-27 NOTE — Progress Notes (Signed)
Cardiology Office Note:    Date:  05/27/2021   ID:  LISBETH PULLER, DOB 11-15-1940, MRN [id number]  PCP:  Aretta Nip, MD   [place]  Cardiologist:  Candee Furbish, MD  Advanced Practice Provider:  No care team member to display Electrophysiologist:  Vickie Epley, MD       Referring MD: Aretta Nip, MD    History of Present Illness:    [name] is a 80 y.o. female here for the follow-up of atrial fibrillation/atrial flutter, and hypertension.    She had postconversion acute on chronic diastolic heart failure contributed by withholding diuretic therapy.  And catecholamine surge related to cardioversion.  She also had IV fluids during procedure.  Lasix was administered for pulmonary edema.  She remained in normal rhythm in the ER seen by Dr. Tamala Julian in cardiology team.  Also sees Dr. Quentin Ore with electrophysiology.  She previously had failed cardioversion prior to amiodarone.  27 mm watchman present.  EF 65%.  (11/23/2020): Her EKG demonstrates atrial fibrillation/flutter once again heart rate 73 bpm.  Once again this was with amiodarone fully loaded post cardioversion.  See below for details.  Today: She is accompanied by a family member. Overall, she is feeling alright aside from having some difficulty sleeping. She does not drink caffeine past 4 PM.  For activity, her [place] is alerting her regularly to stand up and walk. She reports this has helped her motivation.   She endorses arthritis in her bilateral hands. Soon she will undergo a procedure on her right first finger.  She denies any palpitations, chest pain, or shortness of breath. No lightheadedness, headaches, syncope, orthopnea, or PND. Also has no lower extremity edema or exertional symptoms.  Past Medical History:  Diagnosis Date   Acute GI bleeding 05/13/2015   Anxiety    Arthritis    Asthma    Atrial fibrillation ([place]) 10/29/2017   Bakers cyst, left     Chronic kidney disease    stage 3   Constipation    Diabetes mellitus    type 2   Dysrhythmia    GERD (gastroesophageal reflux disease)    Heart murmur    History of cellulitis    History of dizziness    History of hiatal hernia    tiny 06/15/2016   Hypertension    Iron deficiency anemia    Peripheral vascular disease (HCC)    PONV (postoperative nausea and vomiting)    Pulmonary nodule     Past Surgical History:  Procedure Laterality Date   APPENDECTOMY  1952   BIOPSY  10/16/2018   Procedure: BIOPSY;  Surgeon: Clarene Essex, MD;  Location: [facility];  Service: Endoscopy;;   BIOPSY  11/26/2019   Procedure: BIOPSY;  Surgeon: Clarene Essex, MD;  [facility];  Service: Endoscopy;;   BIOPSY  07/14/2020   Procedure: BIOPSY;  Surgeon: Ronald Lobo, MD;  [facility];  Service: Endoscopy;;   CARDIOVERSION N/A 09/09/2020   Procedure: CARDIOVERSION;  Surgeon: Josue Hector, MD;  [facility];  Service: Cardiovascular;  Laterality: N/A;   CARDIOVERSION N/A 11/18/2020   Procedure: CARDIOVERSION;  Surgeon: Donato Heinz, MD;  Location: [place];  Service: Cardiovascular;  Laterality: N/A;   COLONOSCOPY W/ POLYPECTOMY  05/15/2013   ESOPHAGOGASTRODUODENOSCOPY (EGD) WITH PROPOFOL N/A 06/15/2016   Procedure: ESOPHAGOGASTRODUODENOSCOPY (EGD) WITH PROPOFOL;  Surgeon: Clarene Essex, MD;  [facility];  Service: Endoscopy;  Laterality: N/A;   ESOPHAGOGASTRODUODENOSCOPY (EGD)  WITH PROPOFOL N/A 10/16/2018   Procedure: ESOPHAGOGASTRODUODENOSCOPY (EGD) WITH PROPOFOL;  Surgeon: Clarene Essex, MD;  [facility];  Service: Endoscopy;  Laterality: N/A;   ESOPHAGOGASTRODUODENOSCOPY (EGD) WITH PROPOFOL N/A 11/26/2019   Procedure: ESOPHAGOGASTRODUODENOSCOPY (EGD) WITH PROPOFOL;  Surgeon: Clarene Essex, MD;  [facility];  Service: Endoscopy;  Laterality: N/A;   ESOPHAGOGASTRODUODENOSCOPY (EGD) WITH PROPOFOL N/A 07/14/2020   Procedure:  ESOPHAGOGASTRODUODENOSCOPY (EGD) WITH PROPOFOL;  Surgeon: Ronald Lobo, MD;  [facility];  Service: Endoscopy;  Laterality: N/A;   EYE SURGERY Bilateral    cataracts removed   GI RADIOFREQUENCY ABLATION  10/16/2018   Procedure: GI RADIOFREQUENCY ABLATION;  Surgeon: Clarene Essex, MD;  Location: [facility];  Service: Endoscopy;;   GI RADIOFREQUENCY ABLATION N/A 11/26/2019   Procedure: GI RADIOFREQUENCY ABLATION;  Surgeon: Clarene Essex, MD;  [facility];  Service: Endoscopy;  Laterality: N/A;   HAND SURGERY Left 2010   operated on index and ring finger   HOT HEMOSTASIS N/A 07/14/2020   Procedure: HOT HEMOSTASIS (ARGON PLASMA COAGULATION/BICAP);  Surgeon: Ronald Lobo, MD;  Location: [facility];  Service: Endoscopy;  Laterality: N/A;   INCONTINENCE SURGERY     JOINT REPLACEMENT Right    knee   KNEE ARTHROSCOPY  2003 2005   right knee   KYPHOPLASTY N/A 01/29/2020   Procedure: T8 KYPHOPLASTY;  Surgeon: Melina Schools, MD;  Location: [place];  Service: Orthopedics;  Laterality: N/A;  60 mins Local with [facility]   LEFT ATRIAL APPENDAGE OCCLUSION N/A 07/01/2020   Procedure: LEFT ATRIAL APPENDAGE OCCLUSION;  Surgeon: Vickie Epley, MD;  Location: [facility];  Service: Cardiovascular;  Laterality: N/A;   mass fallopian tube     SPINAL CORD STIMULATOR BATTERY EXCHANGE N/A 09/09/2015   Procedure: SPINAL CORD STIMULATOR BATTERY EXCHANGE;  Surgeon: Melina Schools, MD;  Location: [place];  Service: Orthopedics;  Laterality: N/A;   TEE WITHOUT CARDIOVERSION N/A 08/18/2020   Procedure: TRANSESOPHAGEAL ECHOCARDIOGRAM (TEE);  Surgeon: Josue Hector, MD;  Location: [facility];  Service: Cardiovascular;  Laterality: N/A;   TEE WITHOUT CARDIOVERSION N/A 09/09/2020   Procedure: TRANSESOPHAGEAL ECHOCARDIOGRAM (TEE);  Surgeon: Josue Hector, MD;  Location[facility];  Service: Cardiovascular;  Laterality: N/A;   TEE WITHOUT CARDIOVERSION N/A 11/18/2020   Procedure:  TRANSESOPHAGEAL ECHOCARDIOGRAM (TEE);  Surgeon: Donato Heinz, MD;  Location: [place];  Service: Cardiovascular;  Laterality: N/A;   TOTAL KNEE ARTHROPLASTY Left 11/05/2017   Procedure: LEFT TOTAL KNEE ARTHROPLASTY;  Surgeon: Paralee Cancel, MD;  Location: [facility];  Service: Orthopedics;  Laterality: Left;  Adductor Block   VAGINAL HYSTERECTOMY  1977   1 ovary removed    Current Medications: Current Meds  Medication Sig   acetaminophen (TYLENOL) 500 MG tablet Take 1,000 mg by mouth every 8 (eight) hours as needed for mild pain, moderate pain or headache.    albuterol (PROAIR HFA) 108 (90 BASE) MCG/ACT inhaler Inhale 2 puffs into the lungs every 4 (four) hours as needed for wheezing or shortness of breath.   ALPRAZolam (XANAX) 0.25 MG tablet Take 0.25 mg by mouth at bedtime.    aspirin EC 81 MG tablet Take 1 tablet (81 mg total) by mouth daily. Swallow whole.   Continuous Blood Gluc Sensor (FREESTYLE LIBRE 14 DAY SENSOR) MISC Inject 1 patch into the skin every 14 (fourteen) days.   diphenhydrAMINE (BENADRYL) 25 MG tablet Take 25 mg by mouth daily as needed for allergies.   docusate sodium (COLACE) 100 MG capsule Take 100  mg by mouth daily as needed for moderate constipation.   DROPLET PEN NEEDLES 32G X 4 MM MISC    fluticasone (FLONASE) 50 MCG/ACT nasal spray Place 1 spray into both nostrils daily as needed for allergies.    furosemide (LASIX) 40 MG tablet Take 1 tablet (40 mg total) by mouth daily.   gabapentin (NEURONTIN) 300 MG capsule Take 600 mg by mouth in the morning, at noon, and at bedtime.    insulin glargine (LANTUS) 100 UNIT/ML Solostar Pen Inject 24 Units into the skin at bedtime.   Iron-FA-B Cmp-C-Biot-Probiotic (FUSION PLUS) CAPS Take 1 capsule by mouth every evening.    magnesium oxide (MAG-OX) 400 (241.3 Mg) MG tablet Take 0.5 tablets (200 mg total) by mouth 2 (two) times daily.   metoprolol tartrate (LOPRESSOR) 100 MG tablet Take 1 tablet (100 mg total) by mouth 2  (two) times daily.   Multiple Vitamins-Minerals (PRESERVISION AREDS 2 PO) Take 1 capsule by mouth 2 (two) times daily.    NOVOLOG FLEXPEN 100 UNIT/ML FlexPen Inject 8-12 Units into the skin See admin instructions. Inject 8-12 units into the skin, PER SLIDING SCALE, in the morning, noon, and bedtime   OXYGEN Inhale 2.5 L/min into the lungs as needed (for shortness of breath).   pantoprazole (PROTONIX) 40 MG tablet Take 40 mg by mouth daily.   Pitavastatin Calcium (LIVALO) 1 MG TABS Take 2 mg by mouth at bedtime.   potassium chloride SA (KLOR-CON) 20 MEQ tablet Take 2 tablets (40 mEq total) by mouth daily.   sodium chloride (OCEAN) 0.65 % SOLN nasal spray Place 1 spray into both nostrils as needed for congestion.   venlafaxine (EFFEXOR) 37.5 MG tablet Take 37.5 mg by mouth 2 (two) times daily.     Allergies:   Vasotec, Atorvastatin, Codeine, Cymbalta [duloxetine hcl], Lansoprazole, Morphine and related, Sulfate, Amitriptyline hcl, Escitalopram oxalate, Iron, Sertraline hcl, Tramadol, Adhesive [tape], Allevyn adhesive [wound dressings], and Scopolamine   Social History   Socioeconomic History   Marital status: Married    Spouse name: Johnny   Number of children: 2   Years of education: 16   Highest education level: Not on file  Occupational History    Comment: retired Pharmacist, hospital, IT sales professional  Tobacco Use   Smoking status: Never   Smokeless tobacco: Never  Scientific laboratory technician Use: Never used  Substance and Sexual Activity   Alcohol use: No   Drug use: No   Sexual activity: Not Currently    Birth control/protection: Surgical  Other Topics Concern   Not on file  Social History Narrative   Lives with husband   Caffeine use- none   Social Determinants of Health   Financial Resource Strain: Not on file  Food Insecurity: Not on file  Transportation Needs: Not on file  Physical Activity: Not on file  Stress: Not on file  Social Connections: Not on file     Family History: The  patient's family history includes Heart attack in her father; Lung cancer in her mother. There is no history of Colon cancer, Stroke, or Migraines.  ROS:   Please see the history of present illness. (+) Insomnia All other systems are reviewed and negative.    EKGs/Labs/Other Studies Reviewed:    The following studies were reviewed today:  TEE 11/18/2020:  1. Left ventricular ejection fraction, by estimation, is 55 to 60%. The  left ventricle has normal function.   2. Right ventricular systolic function is normal. The right ventricular  size  is normal. There is mildly elevated pulmonary artery systolic  pressure.   3. A small pericardial effusion is present.   4. Right atrial size was moderately dilated.   5. The mitral valve is normal in structure. Mild mitral valve  regurgitation.   6. The aortic valve is tricuspid. Aortic valve regurgitation is not  visualized.   7. Tricuspid valve regurgitation is moderate.   8. S/p Watchman LAA occlusion device, appears well sealed. No thrombus  seen. Left atrial size was moderately dilated.  Monitor 10/28/2020: HR 46-129bpm, average 68bpm. AF 100% of monitoring period. Rare ventricular ectopy.  Echo 07/13/2020:  1. Left ventricular ejection fraction, by estimation, is 55 to 60%. The  left ventricle has normal function. The left ventricle has no regional  wall motion abnormalities. There is mild asymmetric left ventricular  hypertrophy of the posterior-lateral segment. Left ventricular diastolic parameters are indeterminate. Elevated left ventricular end-diastolic pressure.   2. Right ventricular systolic function is mildly reduced. The right  ventricular size is mildly enlarged. There is normal pulmonary artery  systolic pressure. The estimated right ventricular systolic pressure is  23.5 mmHg.   3. Left atrial size was moderately dilated.   4. Right atrial size was severely dilated.   5. The mitral valve is abnormal. Trivial mitral  valve regurgitation.  Moderate mitral annular calcification.   6. Tricuspid valve regurgitation is mild to moderate.   7. The aortic valve is grossly normal. Aortic valve regurgitation is not  visualized. No aortic stenosis is present.   8. The inferior vena cava is normal in size with greater than 50%  respiratory variability, suggesting right atrial pressure of 3 mmHg.   9. S/p 27 mm Watchman FLX 07/01/20, which is not well visualized or  assessed on this transthoracic echo.  Left Atrial Appendage Occlusion 07/01/2020: CONCLUSIONS:  1.  Successful implantation of a WATCHMAN left atrial appendage occlusive device (27 mm) 2.  TEE demonstrating no LAA thrombus 3.  Small pericardial effusion at the beginning of the case.  Slight increase in fluid in the transverse sinus by the end of the procedure, stable throughout the entire procedure.  Cardiac CTA 04/23/2020: FINDINGS: A 120 kV prospective scan was triggered in the descending thoracic aorta at 111 HU's. Gantry rotation speed was 280 msecs and collimation was .9 mm. No beta blockade and no NTG was given. The 3D data set was reconstructed in 5% intervals of the 60-80 % of the R-R cycle. Diastolic phases were analyzed on a dedicated work station using MPR, MIP and VRT modes. The patient received 80 cc of contrast.   There is normal pulmonary vein drainage into the left atrium (2 on the right and 2 on the left) with ostial measurements as follows:   RUPV: 22.9 x 15.7 mm   RLPV: 16.9 x 13.8 mm   LUPV: 18.1 x 12.5 mm   LLPV: 15.9 x 11.8 mm   The left atrial appendage is very large with mixed chicken wing / broccoli morphology, with 1 major lobe, ostial size 22 x 21 mm and length 50 mm. There is no thrombus in the left atrial appendage.   The esophagus runs to the left from the left atrial midline and is in the proximity to the ostia of the LUPV and LLPV.   Aorta: Normal caliber. No dissection. Mild diffuse calcifications.    Aortic Valve: Trileaflet. Minimal calcifications.   Coronary Arteries: Normal coronary origin. Right dominance. The study was performed without use of NTG and  insufficient for plaque evaluation.   Calcium score is 65 that represents 46 percentile for age/sex.   IMPRESSION: 1. There is normal pulmonary vein drainage into the left atrium.   2. The left atrial appendage is very large with mixed chicken wing / broccoli morphology, with 1 major lobe, ostial size 22 x 21 mm and length 50 mm. There is no thrombus in the left atrial appendage.   3. The esophagus runs to the left from the left atrial midline and is in the proximity to the ostia of the LUPV and LLPV.  EKG:  EKG is personally reviewed and interpreted.  11/23/2020: coarse atrial fibrillation/flutter 73 bpm.  Recent Labs: 07/22/2020: TSH 0.549 11/19/2020: ALT 28; B Natriuretic Peptide 257.1; Magnesium 2.0 11/21/2020: BUN 30; Creatinine, Ser 1.09; Hemoglobin 12.6; Platelets 171; Potassium 3.3; Sodium 137   Recent Lipid Panel No results found for: CHOL, TRIG, HDL, CHOLHDL, VLDL, LDLCALC, LDLDIRECT   Risk Assessment/Calculations:      Physical Exam:    VS:  BP 130/70 (BP Location: Left Arm, Patient Position: Sitting, Cuff Size: Normal)   Pulse 62   Ht 5\' 4"  (1.626 m)   Wt 141 lb (64 kg)   SpO2 96%   BMI 24.20 kg/m     Wt Readings from Last 3 Encounters:  05/27/21 141 lb (64 kg)  02/11/21 142 lb (64.4 kg)  11/23/20 144 lb (65.3 kg)     GEN: Well nourished, well developed in no acute distress HEENT: Normal NECK: No JVD; No carotid bruits LYMPHATICS: No lymphadenopathy CARDIAC: Regular rate and rhythm, no murmurs, rubs, gallops RESPIRATORY:  Clear to auscultation without rales, wheezing or rhonchi  ABDOMEN: Soft, non-tender, non-distended MUSCULOSKELETAL:  No edema; No deformity  SKIN: Warm and dry NEUROLOGIC:  Alert and oriented x 3 PSYCHIATRIC:  Normal affect    ASSESSMENT:    1. Paroxysmal atrial  fibrillation (HCC)   2. Presence of Watchman left atrial appendage closure device   3. Hypercholesterolemia     PLAN:    In order of problems listed above: Paroxysmal atrial fibrillation ([place]) Interestingly, on exam today she does sound quite regular.  I did not do an EKG.  Previously she was in atrial fibrillation/atrial flutter well rate controlled.  Continue with current medical management metoprolol 100 mg twice a day.  We have stopped amiodarone previously.  She is failed previous attempts at rhythm control.  Her last cardioversion with amiodarone resulted in brief hospitalization pulmonary edema.  She is not interested in another cardioversion.    Presence of Watchman left atrial appendage closure device Dr. Quentin Ore placed watchman device.  Done quite well with this.  She is excited.  No changes made.  Hypercholesterolemia Continue with Livalo.  No changes made.  1 mg at bedtime.   She will have follow-up with me in 1 year.   Medication Adjustments/Labs and Tests Ordered: Current medicines are reviewed at length with the patient today.  Concerns regarding medicines are outlined above.   No orders of the defined types were placed in this encounter.  No orders of the defined types were placed in this encounter.  Patient Instructions  Medication Instructions:  The current medical regimen is effective;  continue present plan and medications.  *If you need a refill on your cardiac medications before your next appointment, please call your pharmacy*  Follow-Up: At [facility], you and your health needs are our priority.  As part of our continuing mission to provide you with exceptional heart care, we  have created designated Provider Care Teams.  These Care Teams include your primary Cardiologist (physician) and Advanced Practice Providers (APPs -  Physician Assistants and Nurse Practitioners) who all work together to provide you with the care you need, when you need it.  We  recommend signing up for the patient portal called "MyChart".  Sign up information is provided on this After Visit Summary.  MyChart is used to connect with patients for Virtual Visits (Telemedicine).  Patients are able to view lab/test results, encounter notes, upcoming appointments, etc.  Non-urgent messages can be sent to your provider as well.   To learn more about what you can do with MyChart, go to [email or web address].    Your next appointment:   1 year(s)  The format for your next appointment:   In Person  Provider:   Candee Furbish, MD   Thank you for choosing [facility]!!     I,Mathew Stumpf,acting as a scribe for Candee Furbish, MD.,have documented all relevant documentation on the behalf of Candee Furbish, MD,as directed by  Candee Furbish, MD while in the presence of Candee Furbish, MD.  I, Candee Furbish, MD, have reviewed all documentation for this visit. The documentation on 05/27/21 for the exam, diagnosis, procedures, and orders are all accurate and complete.   Signed, Candee Furbish, MD  05/27/2021 3:20 PM    [facility]

## 2021-05-27 NOTE — Assessment & Plan Note (Signed)
Continue with Livalo.  No changes made.  1 mg at bedtime.

## 2021-05-31 DIAGNOSIS — M19041 Primary osteoarthritis, right hand: Secondary | ICD-10-CM | POA: Diagnosis not present

## 2021-05-31 DIAGNOSIS — G8918 Other acute postprocedural pain: Secondary | ICD-10-CM | POA: Diagnosis not present

## 2021-05-31 DIAGNOSIS — M25341 Other instability, right hand: Secondary | ICD-10-CM | POA: Diagnosis not present

## 2021-06-13 ENCOUNTER — Ambulatory Visit (INDEPENDENT_AMBULATORY_CARE_PROVIDER_SITE_OTHER): Payer: Medicare PPO | Admitting: Podiatry

## 2021-06-13 ENCOUNTER — Other Ambulatory Visit: Payer: Self-pay

## 2021-06-13 DIAGNOSIS — E1169 Type 2 diabetes mellitus with other specified complication: Secondary | ICD-10-CM | POA: Diagnosis not present

## 2021-06-13 DIAGNOSIS — B351 Tinea unguium: Secondary | ICD-10-CM | POA: Diagnosis not present

## 2021-06-13 DIAGNOSIS — E1142 Type 2 diabetes mellitus with diabetic polyneuropathy: Secondary | ICD-10-CM

## 2021-06-13 NOTE — Progress Notes (Signed)
  Subjective:  Patient ID: [name], female    DOB: [birth year],  MRN: [id number]  Chief Complaint  Patient presents with   debride    DFC -FBS: 111 a1C: 6.7 CPP: [place] x 3 mo   80 y.o. female presents with the above complaint. History confirmed with patient. Denies new pedal issues. Just had surgery on her right hand.  Objective:  Physical Exam: warm, good capillary refill, onychomycosis of the toenails with pain to palpation of the nailbeds, no trophic changes or ulcerative lesions, normal DP and PT pulses, and absent protective sensation. Xerosis bilat. Left Foot: No POP left dorsal midfoot. Right Foot: normal exam, no swelling, tenderness, instability; ligaments intact, full range of motion of all ankle/foot joints   Assessment:   1. Onychomycosis of multiple toenails with type 2 diabetes mellitus and peripheral neuropathy ([place])    Plan:  Patient was evaluated and treated and all questions answered.  Onychomycosis, Diabetes and DPN -Patient is diabetic with a qualifying condition for at risk foot care.  Procedure: Nail Debridement Type of Debridement: manual, sharp debridement. Instrumentation: Nail nipper, rotary burr. Number of Nails: 10    Arthritis -No pain today.    No follow-ups on file.

## 2021-06-15 DIAGNOSIS — M79641 Pain in right hand: Secondary | ICD-10-CM | POA: Diagnosis not present

## 2021-06-15 DIAGNOSIS — Z4789 Encounter for other orthopedic aftercare: Secondary | ICD-10-CM | POA: Diagnosis not present

## 2021-06-21 ENCOUNTER — Other Ambulatory Visit: Payer: Self-pay | Admitting: Cardiology

## 2021-06-29 DIAGNOSIS — M79641 Pain in right hand: Secondary | ICD-10-CM | POA: Diagnosis not present

## 2021-07-11 DIAGNOSIS — E1165 Type 2 diabetes mellitus with hyperglycemia: Secondary | ICD-10-CM | POA: Diagnosis not present

## 2021-07-11 DIAGNOSIS — E11319 Type 2 diabetes mellitus with unspecified diabetic retinopathy without macular edema: Secondary | ICD-10-CM | POA: Diagnosis not present

## 2021-07-11 DIAGNOSIS — E7800 Pure hypercholesterolemia, unspecified: Secondary | ICD-10-CM | POA: Diagnosis not present

## 2021-07-11 DIAGNOSIS — F339 Major depressive disorder, recurrent, unspecified: Secondary | ICD-10-CM | POA: Diagnosis not present

## 2021-07-11 DIAGNOSIS — Z23 Encounter for immunization: Secondary | ICD-10-CM | POA: Diagnosis not present

## 2021-07-11 DIAGNOSIS — E1142 Type 2 diabetes mellitus with diabetic polyneuropathy: Secondary | ICD-10-CM | POA: Diagnosis not present

## 2021-07-11 DIAGNOSIS — I10 Essential (primary) hypertension: Secondary | ICD-10-CM | POA: Diagnosis not present

## 2021-07-11 DIAGNOSIS — I4819 Other persistent atrial fibrillation: Secondary | ICD-10-CM | POA: Diagnosis not present

## 2021-07-11 DIAGNOSIS — Z794 Long term (current) use of insulin: Secondary | ICD-10-CM | POA: Diagnosis not present

## 2021-07-13 DIAGNOSIS — Z4789 Encounter for other orthopedic aftercare: Secondary | ICD-10-CM | POA: Diagnosis not present

## 2021-07-13 DIAGNOSIS — M79641 Pain in right hand: Secondary | ICD-10-CM | POA: Diagnosis not present

## 2021-07-13 DIAGNOSIS — E1142 Type 2 diabetes mellitus with diabetic polyneuropathy: Secondary | ICD-10-CM | POA: Diagnosis not present

## 2021-07-19 ENCOUNTER — Encounter: Payer: Self-pay | Admitting: Cardiology

## 2021-07-19 DIAGNOSIS — E7800 Pure hypercholesterolemia, unspecified: Secondary | ICD-10-CM

## 2021-07-19 DIAGNOSIS — Z79899 Other long term (current) drug therapy: Secondary | ICD-10-CM

## 2021-07-19 MED ORDER — ICOSAPENT ETHYL 1 G PO CAPS: 2.0000 g | ORAL_CAPSULE | Freq: Two times a day (BID) | ORAL | 3 refills | Status: DC

## 2021-07-27 ENCOUNTER — Encounter: Payer: Self-pay | Admitting: Cardiology

## 2021-07-28 ENCOUNTER — Encounter: Payer: Self-pay | Admitting: Cardiology

## 2021-08-01 DIAGNOSIS — H903 Sensorineural hearing loss, bilateral: Secondary | ICD-10-CM | POA: Diagnosis not present

## 2021-08-01 DIAGNOSIS — H9103 Ototoxic hearing loss, bilateral: Secondary | ICD-10-CM | POA: Diagnosis not present

## 2021-08-08 DIAGNOSIS — E1142 Type 2 diabetes mellitus with diabetic polyneuropathy: Secondary | ICD-10-CM | POA: Diagnosis not present

## 2021-08-10 DIAGNOSIS — M25531 Pain in right wrist: Secondary | ICD-10-CM | POA: Diagnosis not present

## 2021-08-10 DIAGNOSIS — S60211A Contusion of right wrist, initial encounter: Secondary | ICD-10-CM | POA: Diagnosis not present

## 2021-08-10 DIAGNOSIS — Z4789 Encounter for other orthopedic aftercare: Secondary | ICD-10-CM | POA: Diagnosis not present

## 2021-08-15 DIAGNOSIS — U071 COVID-19: Secondary | ICD-10-CM | POA: Diagnosis not present

## 2021-08-25 ENCOUNTER — Ambulatory Visit: Payer: Medicare PPO | Admitting: Cardiology

## 2021-08-30 ENCOUNTER — Telehealth: Payer: Self-pay | Admitting: *Deleted

## 2021-08-30 DIAGNOSIS — M5451 Vertebrogenic low back pain: Secondary | ICD-10-CM | POA: Diagnosis not present

## 2021-08-30 NOTE — Telephone Encounter (Signed)
Procedures in 3 days, I have called in the left a message both the home and cell phone number for the patient to call back and speak to the on-call preop APP of the day

## 2021-08-30 NOTE — Telephone Encounter (Signed)
° °  Pre-operative Risk Assessment    Patient Name: [name]  DOB: [birth year] MRN: [id number]      Request for Surgical Clearance    Procedure:   SPINAL CORD STIMULATOR BATTERY REPLACEMENT  Date of Surgery:  Clearance 09/02/21                                 Surgeon:  DR. DAHARI BROOKS Surgeon's Group or Practice Name:   [facility] Phone number:   [phone] Fax number:   [phone] ATTN:  SHERRY WILLIS   Type of Clearance Requested:   - Pharmacy:  Hold Aspirin NOT INDICATED   Type of Anesthesia:  General    Additional requests/questions:  Please fax a copy of CLEARANCE to the surgeon's office.  Signed, Jeanann Lewandowsky   08/30/2021, 12:23 PM

## 2021-08-31 DIAGNOSIS — Z95818 Presence of other cardiac implants and grafts: Secondary | ICD-10-CM | POA: Diagnosis not present

## 2021-08-31 DIAGNOSIS — E11319 Type 2 diabetes mellitus with unspecified diabetic retinopathy without macular edema: Secondary | ICD-10-CM | POA: Diagnosis not present

## 2021-08-31 DIAGNOSIS — I4819 Other persistent atrial fibrillation: Secondary | ICD-10-CM | POA: Diagnosis not present

## 2021-08-31 DIAGNOSIS — Z01818 Encounter for other preprocedural examination: Secondary | ICD-10-CM | POA: Diagnosis not present

## 2021-08-31 DIAGNOSIS — I10 Essential (primary) hypertension: Secondary | ICD-10-CM | POA: Diagnosis not present

## 2021-08-31 NOTE — Telephone Encounter (Signed)
° °  Name: [name]  DOB: 03/18/1941  MRN: [id number]   Primary Cardiologist: Candee Furbish, MD  Chart reviewed as part of pre-operative protocol coverage. Patient was contacted 08/31/2021 in reference to pre-operative risk assessment for pending surgery as outlined below.  [name] was last seen on 05/27/2021 by Dr. Marlou Porch.  Since that day, [name] has done well without chest pain or worsening dyspnea.  Therefore, based on ACC/AHA guidelines, the patient would be at acceptable risk for the planned procedure without further cardiovascular testing.   Patient may hold aspirin for 7 days prior to the procedure and restart it as soon as possible afterward at the surgeon's discretion.  The patient was advised that if she develops new symptoms prior to surgery to contact our office to arrange for a follow-up visit, and she verbalized understanding.  I will route this recommendation to the requesting party via Epic fax function and remove from pre-op pool. Please call with questions.  [place], [place] 08/31/2021, 4:10 PM

## 2021-09-01 ENCOUNTER — Ambulatory Visit: Payer: Self-pay | Admitting: Orthopedic Surgery

## 2021-09-01 DIAGNOSIS — M79641 Pain in right hand: Secondary | ICD-10-CM | POA: Diagnosis not present

## 2021-09-01 DIAGNOSIS — Z01818 Encounter for other preprocedural examination: Secondary | ICD-10-CM

## 2021-09-01 DIAGNOSIS — M152 Bouchard's nodes (with arthropathy): Secondary | ICD-10-CM | POA: Diagnosis not present

## 2021-09-01 DIAGNOSIS — S60211A Contusion of right wrist, initial encounter: Secondary | ICD-10-CM | POA: Diagnosis not present

## 2021-09-07 ENCOUNTER — Encounter (HOSPITAL_COMMUNITY): Payer: Self-pay | Admitting: Orthopedic Surgery

## 2021-09-07 ENCOUNTER — Other Ambulatory Visit: Payer: Self-pay

## 2021-09-07 NOTE — Progress Notes (Signed)
Anesthesia Chart Review: [name]  Case: [id number] Date/Time: 09/08/21 1015   Procedure: SPINAL CORD STIMULATOR BATTERY EXCHANGE   Anesthesia type: General   Pre-op diagnosis: End of life spinal cord stimulator battery   Location: [place] OR ROOM 04 / [place] [facility]   Surgeons: Melina Schools, MD       DISCUSSION: Patient is an 81 year old female scheduled for the above procedure.  History includes never smoker, post-operative N/V (N/V with codeine, itching with Morphine), HTN, murmur (mild MR, moderate TR 11/18/20 TEE), afib (s/p Watchman LAA occlusion device 07/01/20; DCCV 09/09/20, 11/18/20), chronic diastolic CHF, PVD, CKD (stage 3), DM2, asthma, GERD, hiatal hernia, recurrent GI bleed (gastric antral vascular ectasia, s/p radiofrequency ablation 10/16/18, 11/26/19, and s/p APC 07/14/20), iron deficiency anemia, pulmonary nodule (stable 3 mm RLL nodule, no follow-up recommended 06/13/16), spinal surgery (X-stop L4-5 02/20/07; spinal cord stimulator implant 07/16/08; T8 kyphoplasty 01/29/20), TKA (right 09/02/04; left 11/05/17).    Preoperative cardiology input outlined on 08/31/21 by Almyra Deforest, PA, "[name] was last seen on 05/27/2021 by Dr. Marlou Porch.  Since that day, [name] has done well without chest pain or worsening dyspnea.   Therefore, based on ACC/AHA guidelines, the patient would be at acceptable risk for the planned procedure without further cardiovascular testing.    Patient may hold aspirin for 7 days prior to the procedure and restart it as soon as possible afterward at the surgeon's discretion..."  She is a same day work-up, so labs as indicated and anesthesia team evaluation on the day of surgery.   VS:  BP Readings from Last 3 Encounters:  05/27/21 130/70  02/11/21 124/72  11/23/20 130/80   Pulse Readings from Last 3 Encounters:  05/27/21 62  02/11/21 62  11/23/20 76     PROVIDERS: Rankins, Bill Salinas, MD is PCP  - Candee Furbish, MD is cardiologist. Last visit  05/27/21. Known recurrent afib with Watchman device due to high risk for anticoagulation given recurrent GI bleed. Following last cardiology with amiodarone she required a brief hospitalization for pulmonary edema, and she was not interested in pursing another cardioversion. She was in SR at that appointment. Continue b-blocker. Lars Mage, MD is EP cardiologist. Last visit 02/11/21. Plavix discontinued since 6 months out from [employer] device. ASA 81 mg daily recommended. Six month follow-up planned.  Clarene Essex, MD is GI   LABS: For day of surgery. A1c 6.2%, Cr 1.09, AST 20, ALT 27 07/11/21 [facility]). She uses a [employer]. H/H 13.6/42.2 12/30/20 ([facility])   PFTs 01/08/15: FVC 1.77 (62% pre, 70% post BD), FEV1 1.27 (59% pre, 67% post BD), DLCOunc 17.66 (72%).    IMAGES: CXR 11/19/20: FINDINGS: Diffuse interstitial opacity which is new. Mild cardiomegaly. Cardiac implant in the region of the left atrial appendage. No visible effusion. No pneumothorax. Spinal cord stimulator over the thoracic spine. IMPRESSION: CHF pattern.   EKG: 02/11/21: NSR   CV: TEE 11/18/20: IMPRESSIONS   1. Left ventricular ejection fraction, by estimation, is 55 to 60%. The  left ventricle has normal function.   2. Right ventricular systolic function is normal. The right ventricular  size is normal. There is mildly elevated pulmonary artery systolic  pressure.   3. A small pericardial effusion is present.   4. Right atrial size was moderately dilated.   5. The mitral valve is normal in structure. Mild mitral valve  regurgitation.   6. The aortic valve is tricuspid. Aortic valve regurgitation is not  visualized.   7. Tricuspid valve regurgitation is moderate.   8. S/p Watchman LAA occlusion device, appears well sealed. No thrombus  seen. Left atrial size was moderately dilated.    Long-term cardiac monitor 10/14/20 - 10/20/20: Study Highlights HR 46-129bpm, average  68bpm. AF 100% of monitoring period. Rare ventricular ectopy. EP PROCEDURES 07/01/20:  1. Transseptal puncture 2. Transesophageal echocardiogram 3. Left atrial appendage occlusive device placement.   Exercise Nuclear stress test 12/24/14 (Dr. Wynonia Lawman, scanned under Media tab, Correspondence 09/09/15):  Impression: 1. Normal stress Myoview study with no evidence of ischemia or infarction. 2. Normal quantitative gated SPECT EF of 72% with normal wall motion.  3. Reduced exercise tolerance for age.  Recommendations: This study shows no evidence of myocardial ischemia. Her exercise capacity severely reduced however that limits the prognostic value of the test.   Past Medical History:  Diagnosis Date   Acute GI bleeding 05/13/2015   Anxiety    Arthritis    Asthma    Atrial fibrillation ([place]) 10/29/2017   Bakers cyst, left    Chronic kidney disease    stage 3   Constipation    Diabetes mellitus    type 2   Dysrhythmia    GERD (gastroesophageal reflux disease)    Heart murmur    History of cellulitis    History of dizziness    History of hiatal hernia    tiny 06/15/2016   Hypertension    Iron deficiency anemia    Peripheral vascular disease (HCC)    PONV (postoperative nausea and vomiting)    Pulmonary nodule     Past Surgical History:  Procedure Laterality Date   APPENDECTOMY  1952   BIOPSY  10/16/2018   Procedure: BIOPSY;  Surgeon: Clarene Essex, MD;  Location: [facility];  Service: Endoscopy;;   BIOPSY  11/26/2019   Procedure: BIOPSY;  Surgeon: Clarene Essex, MD;  Location: [facility];  Service: Endoscopy;;   BIOPSY  07/14/2020   Procedure: BIOPSY;  Surgeon: Ronald Lobo, MD;  [facility]: [facility];  Service: Endoscopy;;   CARDIOVERSION N/A 09/09/2020   Procedure: CARDIOVERSION;  Surgeon: Josue Hector, MD;  Location: [place];  Service: Cardiovascular;  Laterality: N/A;   CARDIOVERSION N/A 11/18/2020   Procedure: CARDIOVERSION;  Surgeon: Donato Heinz,  MD;  Location: [place];  Service: Cardiovascular;  Laterality: N/A;   COLONOSCOPY W/ POLYPECTOMY  05/15/2013   ESOPHAGOGASTRODUODENOSCOPY (EGD) WITH PROPOFOL N/A 06/15/2016   Procedure: ESOPHAGOGASTRODUODENOSCOPY (EGD) WITH PROPOFOL;  Surgeon: Clarene Essex, MD;  [facility];  Service: Endoscopy;  Laterality: N/A;   ESOPHAGOGASTRODUODENOSCOPY (EGD) WITH PROPOFOL N/A 10/16/2018   Procedure: ESOPHAGOGASTRODUODENOSCOPY (EGD) WITH PROPOFOL;  Surgeon: Clarene Essex, MD;  [facility];  Service: Endoscopy;  Laterality: N/A;   ESOPHAGOGASTRODUODENOSCOPY (EGD) WITH PROPOFOL N/A 11/26/2019   Procedure: ESOPHAGOGASTRODUODENOSCOPY (EGD) WITH PROPOFOL;  Surgeon: Clarene Essex, MD;  [facility];  Service: Endoscopy;  Laterality: N/A;   ESOPHAGOGASTRODUODENOSCOPY (EGD) WITH PROPOFOL N/A 07/14/2020   Procedure: ESOPHAGOGASTRODUODENOSCOPY (EGD) WITH PROPOFOL;  Surgeon: Ronald Lobo, MD;  [facility];  Service: Endoscopy;  Laterality: N/A;   EYE SURGERY Bilateral    cataracts removed   GI RADIOFREQUENCY ABLATION  10/16/2018   Procedure: GI RADIOFREQUENCY ABLATION;  Surgeon: Clarene Essex, MD;  [facility];  Service: Endoscopy;;   GI RADIOFREQUENCY ABLATION N/A 11/26/2019   Procedure: GI RADIOFREQUENCY ABLATION;  Surgeon: Clarene Essex, MD;  [facility];  Service: Endoscopy;  Laterality: N/A;   HAND SURGERY Left 2010  operated on index and ring finger   HOT HEMOSTASIS N/A 07/14/2020   Procedure: HOT HEMOSTASIS (ARGON PLASMA COAGULATION/BICAP);  Surgeon: Ronald Lobo, MD;  Location: [facility];  Service: Endoscopy;  Laterality: N/A;   INCONTINENCE SURGERY     JOINT REPLACEMENT Right    knee   KNEE ARTHROSCOPY  2003 2005   right knee   KYPHOPLASTY N/A 01/29/2020   Procedure: T8 KYPHOPLASTY;  Surgeon: Melina Schools, MD;  Location: [place];  Service: Orthopedics;  Laterality: N/A;  60 mins Local with [facility]   LEFT ATRIAL APPENDAGE OCCLUSION N/A  07/01/2020   Procedure: LEFT ATRIAL APPENDAGE OCCLUSION;  Surgeon: Vickie Epley, MD;  Location: [place] [facility];  Service: Cardiovascular;  Laterality: N/A;   mass fallopian tube     SPINAL CORD STIMULATOR BATTERY EXCHANGE N/A 09/09/2015   Procedure: SPINAL CORD STIMULATOR BATTERY EXCHANGE;  Surgeon: Melina Schools, MD;  Location: [place];  Service: Orthopedics;  Laterality: N/A;   TEE WITHOUT CARDIOVERSION N/A 08/18/2020   Procedure: TRANSESOPHAGEAL ECHOCARDIOGRAM (TEE);  Surgeon: Josue Hector, MD;  Location: [facility];  Service: Cardiovascular;  Laterality: N/A;   TEE WITHOUT CARDIOVERSION N/A 09/09/2020   Procedure: TRANSESOPHAGEAL ECHOCARDIOGRAM (TEE);  Surgeon: Josue Hector, MD;  [facility];  Service: Cardiovascular;  Laterality: N/A;   TEE WITHOUT CARDIOVERSION N/A 11/18/2020   Procedure: TRANSESOPHAGEAL ECHOCARDIOGRAM (TEE);  Surgeon: Donato Heinz, MD;  Location: [place];  Service: Cardiovascular;  Laterality: N/A;   TOTAL KNEE ARTHROPLASTY Left 11/05/2017   Procedure: LEFT TOTAL KNEE ARTHROPLASTY;  Surgeon: Paralee Cancel, MD;  Location: [facility];  Service: Orthopedics;  Laterality: Left;  Adductor Block   VAGINAL HYSTERECTOMY  1977   1 ovary removed    MEDICATIONS: No current facility-administered medications for this encounter.    acetaminophen (TYLENOL) 500 MG tablet   albuterol (PROAIR HFA) 108 (90 BASE) MCG/ACT inhaler   ALPRAZolam (XANAX) 0.25 MG tablet   aspirin EC 81 MG tablet   diphenhydrAMINE (BENADRYL) 25 MG tablet   docusate sodium (COLACE) 100 MG capsule   esomeprazole (NEXIUM) 40 MG capsule   fluticasone (FLONASE) 50 MCG/ACT nasal spray   furosemide (LASIX) 40 MG tablet   gabapentin (NEURONTIN) 300 MG capsule   insulin glargine (LANTUS) 100 UNIT/ML Solostar Pen   ipratropium (ATROVENT) 0.06 % nasal spray   KLOR-CON M20 20 MEQ tablet   magnesium oxide (MAG-OX) 400 (241.3 Mg) MG tablet   metoprolol tartrate (LOPRESSOR) 100 MG  tablet   Multiple Vitamins-Minerals (PRESERVISION AREDS 2 PO)   NOVOLOG FLEXPEN 100 UNIT/ML FlexPen   pantoprazole (PROTONIX) 40 MG tablet   Pitavastatin Calcium (LIVALO) 1 MG TABS   sodium chloride (OCEAN) 0.65 % SOLN nasal spray   TRULICITY 2.40 XB/3.5HG SOPN   venlafaxine (EFFEXOR) 37.5 MG tablet   Continuous Blood Gluc Sensor (FREESTYLE LIBRE 14 DAY SENSOR) MISC   DROPLET PEN NEEDLES 32G X 4 MM MISC   icosapent Ethyl (VASCEPA) 1 g capsule    Myra Gianotti, PA-C Surgical Short Stay/Anesthesiology [facility] Phone [phone] [facility] Phone [phone] 09/07/2021 2:47 PM

## 2021-09-07 NOTE — Progress Notes (Addendum)
PCP - Dr. Gwenyth Allegra  Cardiologist - Dr. Derl Barrow  EP- Dr. Grayce Sessions  Endocrine- Denies  Pulm- Denies  Chest x-ray - 11/19/20 (E)  EKG - 02/11/21 (E)  Stress Test - Denies  ECHO - 11/18/20 (E)  Cardiac Cath - Denies  AICD-na PM-na LOOP-na  Dialysis- Denies  Sleep Study - Denies CPAP - Denies  LABS- 09/08/21: CBC, BMP, UA, PCR  ASA- LD-1/4  ERAS- No  HA1C- 07/11/21 (E): 6.2 Fasting Blood Sugar - 100-250 Checks Blood Sugar _5-7____ times a day. Pt stats she has a [employer] meter currently on the Right Upper Arm  Anesthesia- Yes- cardiac hx/surgical order  Pt denies having chest pain, sob, or fever during the pre-op phone call. All instructions explained to the pt, with a verbal understanding of the material including: as of today, stop taking all Aspirin (unless instructed by your doctor) and Other Aspirin containing products, Vitamins, Fish oils, and Herbal medications. Also stop all NSAIDS i.e. Advil, Ibuprofen, Motrin, Aleve, Anaprox, Naproxen, BC, Goody Powders, and all Supplements.   WHAT DO I DO ABOUT MY DIABETES MEDICATION?  THE NIGHT BEFORE SURGERY, take ______11_____ units of _____Lantus______insulin.      The day of surgery, do not take Trulicity (dulaglutide).  If your CBG is greater than 220 mg/dL, you may take 5-7 units of your Novolog insulin.  How do I manage my blood sugar before surgery? Check your blood sugar the morning of your surgery when you wake up and every 2 hours until you get to the Short Stay unit. If your blood sugar is less than 70 mg/dL, you will need to treat for low blood sugar: Do not take insulin. Treat a low blood sugar (less than 70 mg/dL) with  cup of clear juice (cranberry or apple), 4 glucose tablets, OR glucose gel. Recheck blood sugar in 15 minutes after treatment (to make sure it is greater than 70 mg/dL). If your blood sugar is not greater than 70 mg/dL on recheck, call [phone]  for further  instructions. Report your blood sugar to the short stay nurse when you get to Short Stay.  Reviewed and Endorsed by [facility] Patient Education Committee, August 2015  Pt also instructed to wear a mask and social distance if she goes out. The opportunity to ask questions was provided.    Coronavirus Screening  Have you experienced the following symptoms:  Cough yes/no: No Fever (>100.71F)  yes/no: No Runny nose yes/no: No Sore throat yes/no: No Difficulty breathing/shortness of breath  yes/no: No  Have you or a family member traveled in the last 14 days and where? yes/no: No   If the patient indicates "YES" to the above questions, their PAT will be rescheduled to limit the exposure to others and, the surgeon will be notified. THE PATIENT WILL NEED TO BE ASYMPTOMATIC FOR 14 DAYS.   If the patient is not experiencing any of these symptoms, the PAT nurse will instruct them to NOT bring anyone with them to their appointment since they may have these symptoms or traveled as well.   Please remind your patients and families that hospital visitation restrictions are in effect and the importance of the restrictions.

## 2021-09-07 NOTE — Anesthesia Preprocedure Evaluation (Deleted)
Anesthesia Evaluation    Airway        Dental   Pulmonary           Cardiovascular hypertension,      Neuro/Psych    GI/Hepatic   Endo/Other  diabetes  Renal/GU      Musculoskeletal   Abdominal   Peds  Hematology   Anesthesia Other Findings   Reproductive/Obstetrics                                                              Anesthesia Evaluation  Patient identified by MRN, date of birth, ID band Patient awake    Reviewed: Allergy & Precautions, NPO status , Patient's Chart, lab work & pertinent test results  History of Anesthesia Complications (+) PONV and history of anesthetic complications  Airway Mallampati: III  TM Distance: >3 FB Neck ROM: Full    Dental no notable dental hx.    Pulmonary asthma ,    Pulmonary exam normal breath sounds clear to auscultation       Cardiovascular hypertension, + CAD and + Peripheral Vascular Disease  Normal cardiovascular exam Rhythm:Regular Rate:Normal  08/2020: 1. Since Watchman FLX well sealed and no thrombus proceded with [facility] with patient only on plavix. [place] x 1 with 150J Converted to NSR with no cmplicaitons. 2. Left ventricular ejection fraction, by estimation, is 60 to 65%. The left ventricle has normal function. 3. Right ventricular systolic function is normal. The right ventricular size is normal. 4. 27 mm Watchman FLX device in good position with no leak. No thrombus and average compression in multiple views 28%. Left atrial size was moderately dilated. No left atrial/left atrial appendage thrombus was detected. 5. Right atrial size was moderately dilated. 6. The mitral valve is abnormal. Mild mitral valve regurgitation. 7. The aortic valve is tricuspid. Aortic valve regurgitation is mild. Mild to moderate aortic valve sclerosis/calcification is present, without any evidence of aortic stenosis.    Neuro/Psych PSYCHIATRIC DISORDERS Anxiety Depression  Neuromuscular disease (peripheral neuropathy)    GI/Hepatic Neg liver ROS, hiatal hernia, GERD  ,  Endo/Other  negative endocrine ROSdiabetes  Renal/GU Renal InsufficiencyRenal disease  negative genitourinary   Musculoskeletal  (+) Arthritis ,   Abdominal   Peds negative pediatric ROS (+)  Hematology  (+) anemia ,   Anesthesia Other Findings   Reproductive/Obstetrics negative OB ROS                            Anesthesia Physical Anesthesia Plan  ASA: III  Anesthesia Plan: MAC   Post-op Pain Management:    Induction: Intravenous  PONV Risk Score and Plan: 3 and Ondansetron and Treatment may vary due to age or medical condition  Airway Management Planned: Natural Airway  Additional Equipment: None  Intra-op Plan:   Post-operative Plan:   Informed Consent: I have reviewed the patients History and Physical, chart, labs and discussed the procedure including the risks, benefits and alternatives for the proposed anesthesia with the patient or authorized representative who has indicated his/her understanding and acceptance.       Plan Discussed with: CRNA and Anesthesiologist  Anesthesia Plan Comments:        Anesthesia Quick Evaluation  Anesthesia Physical Anesthesia Plan  ASA:  Anesthesia Plan:    Post-op Pain Management:    Induction:   PONV Risk Score and Plan:   Airway Management Planned:   Additional Equipment:   Intra-op Plan:   Post-operative Plan:   Informed Consent:   Plan Discussed with:   Anesthesia Plan Comments: (PAT note written 09/07/2021 by Myra Gianotti, PA-C. )        Anesthesia Quick Evaluation

## 2021-09-08 ENCOUNTER — Ambulatory Visit (HOSPITAL_COMMUNITY): Payer: Medicare PPO | Admitting: Vascular Surgery

## 2021-09-08 ENCOUNTER — Ambulatory Visit: Payer: Self-pay | Admitting: Orthopedic Surgery

## 2021-09-08 ENCOUNTER — Ambulatory Visit (HOSPITAL_COMMUNITY)
Admission: RE | Admit: 2021-09-08 | Discharge: 2021-09-08 | Disposition: A | Discharge status: Home / Self Care | Payer: Medicare PPO | Attending: Orthopedic Surgery | Admitting: Orthopedic Surgery

## 2021-09-08 ENCOUNTER — Encounter (HOSPITAL_COMMUNITY): Payer: Self-pay | Admitting: Orthopedic Surgery

## 2021-09-08 ENCOUNTER — Encounter (HOSPITAL_COMMUNITY)
Admission: RE | Disposition: A | Discharge status: Home / Self Care | Payer: Self-pay | Source: Home / Self Care | Attending: Orthopedic Surgery

## 2021-09-08 DIAGNOSIS — K449 Diaphragmatic hernia without obstruction or gangrene: Secondary | ICD-10-CM | POA: Insufficient documentation

## 2021-09-08 DIAGNOSIS — I130 Hypertensive heart and chronic kidney disease with heart failure and stage 1 through stage 4 chronic kidney disease, or unspecified chronic kidney disease: Secondary | ICD-10-CM | POA: Diagnosis not present

## 2021-09-08 DIAGNOSIS — T85113A Breakdown (mechanical) of implanted electronic neurostimulator, generator, initial encounter: Secondary | ICD-10-CM | POA: Diagnosis not present

## 2021-09-08 DIAGNOSIS — F418 Other specified anxiety disorders: Secondary | ICD-10-CM | POA: Diagnosis not present

## 2021-09-08 DIAGNOSIS — E1142 Type 2 diabetes mellitus with diabetic polyneuropathy: Secondary | ICD-10-CM | POA: Diagnosis not present

## 2021-09-08 DIAGNOSIS — D509 Iron deficiency anemia, unspecified: Secondary | ICD-10-CM | POA: Diagnosis not present

## 2021-09-08 DIAGNOSIS — I4891 Unspecified atrial fibrillation: Secondary | ICD-10-CM | POA: Insufficient documentation

## 2021-09-08 DIAGNOSIS — E1151 Type 2 diabetes mellitus with diabetic peripheral angiopathy without gangrene: Secondary | ICD-10-CM | POA: Diagnosis not present

## 2021-09-08 DIAGNOSIS — J45909 Unspecified asthma, uncomplicated: Secondary | ICD-10-CM | POA: Diagnosis not present

## 2021-09-08 DIAGNOSIS — I5032 Chronic diastolic (congestive) heart failure: Secondary | ICD-10-CM | POA: Diagnosis not present

## 2021-09-08 DIAGNOSIS — I081 Rheumatic disorders of both mitral and tricuspid valves: Secondary | ICD-10-CM | POA: Insufficient documentation

## 2021-09-08 DIAGNOSIS — M1990 Unspecified osteoarthritis, unspecified site: Secondary | ICD-10-CM | POA: Diagnosis not present

## 2021-09-08 DIAGNOSIS — R911 Solitary pulmonary nodule: Secondary | ICD-10-CM | POA: Diagnosis not present

## 2021-09-08 DIAGNOSIS — T8484XA Pain due to internal orthopedic prosthetic devices, implants and grafts, initial encounter: Secondary | ICD-10-CM | POA: Diagnosis not present

## 2021-09-08 DIAGNOSIS — I10 Essential (primary) hypertension: Secondary | ICD-10-CM | POA: Diagnosis not present

## 2021-09-08 DIAGNOSIS — I2510 Atherosclerotic heart disease of native coronary artery without angina pectoris: Secondary | ICD-10-CM | POA: Diagnosis not present

## 2021-09-08 DIAGNOSIS — F32A Depression, unspecified: Secondary | ICD-10-CM | POA: Insufficient documentation

## 2021-09-08 DIAGNOSIS — F419 Anxiety disorder, unspecified: Secondary | ICD-10-CM | POA: Insufficient documentation

## 2021-09-08 DIAGNOSIS — N183 Chronic kidney disease, stage 3 unspecified: Secondary | ICD-10-CM | POA: Insufficient documentation

## 2021-09-08 DIAGNOSIS — Z4549 Encounter for adjustment and management of other implanted nervous system device: Secondary | ICD-10-CM | POA: Insufficient documentation

## 2021-09-08 DIAGNOSIS — I3139 Other pericardial effusion (noninflammatory): Secondary | ICD-10-CM | POA: Insufficient documentation

## 2021-09-08 DIAGNOSIS — Z9682 Presence of neurostimulator: Secondary | ICD-10-CM | POA: Insufficient documentation

## 2021-09-08 DIAGNOSIS — E1122 Type 2 diabetes mellitus with diabetic chronic kidney disease: Secondary | ICD-10-CM | POA: Diagnosis not present

## 2021-09-08 DIAGNOSIS — K219 Gastro-esophageal reflux disease without esophagitis: Secondary | ICD-10-CM | POA: Diagnosis not present

## 2021-09-08 DIAGNOSIS — Z01818 Encounter for other preprocedural examination: Secondary | ICD-10-CM

## 2021-09-08 HISTORY — PX: SPINAL CORD STIMULATOR BATTERY EXCHANGE: SHX6202

## 2021-09-08 LAB — BASIC METABOLIC PANEL
Anion gap: 13 (ref 5–15)
BUN: 19 mg/dL (ref 8–23)
CO2: 28 mmol/L (ref 22–32)
Calcium: 9.5 mg/dL (ref 8.9–10.3)
Chloride: 98 mmol/L (ref 98–111)
Creatinine, Ser: 0.99 mg/dL (ref 0.44–1.00)
GFR, Estimated: 58 mL/min — ABNORMAL LOW (ref >60)
Glucose, Bld: 137 mg/dL — ABNORMAL HIGH (ref 70–99)
Potassium: 4.1 mmol/L (ref 3.5–5.1)
Sodium: 139 mmol/L (ref 135–145)

## 2021-09-08 LAB — CBC
HCT: 42.6 % (ref 36.0–46.0)
Hemoglobin: 13.4 g/dL (ref 12.0–15.0)
MCH: 29.2 pg (ref 26.0–34.0)
MCHC: 31.5 g/dL (ref 30.0–36.0)
MCV: 92.8 fL (ref 80.0–100.0)
Platelets: 250 10*3/uL (ref 150–400)
RBC: 4.59 MIL/uL (ref 3.87–5.11)
RDW: 13.4 % (ref 11.5–15.5)
WBC: 6.3 10*3/uL (ref 4.0–10.5)
nRBC: 0 % (ref 0.0–0.2)

## 2021-09-08 LAB — GLUCOSE, CAPILLARY
Glucose-Capillary: 159 mg/dL — ABNORMAL HIGH (ref 70–99)
Glucose-Capillary: 158 mg/dL — ABNORMAL HIGH (ref 70–99)

## 2021-09-08 SURGERY — SPINAL CORD STIMULATOR BATTERY EXCHANGE
Anesthesia: General | Site: Buttocks

## 2021-09-08 MED ORDER — FENTANYL CITRATE (PF) 250 MCG/5ML IJ SOLN
INTRAMUSCULAR | Status: AC
  Filled 2021-09-08: qty 5

## 2021-09-08 MED ORDER — GLYCOPYRROLATE PF 0.2 MG/ML IJ SOSY
PREFILLED_SYRINGE | INTRAMUSCULAR | Status: DC | PRN
Start: 2021-09-08 — End: 2021-09-08
  Administered 2021-09-08: .1 mg via INTRAVENOUS

## 2021-09-08 MED ORDER — ROCURONIUM BROMIDE 10 MG/ML (PF) SYRINGE
PREFILLED_SYRINGE | INTRAVENOUS | Status: DC | PRN
Start: 2021-09-08 — End: 2021-09-08
  Administered 2021-09-08: 50 mg via INTRAVENOUS

## 2021-09-08 MED ORDER — SUGAMMADEX SODIUM 200 MG/2ML IV SOLN
INTRAVENOUS | Status: DC | PRN
Start: 2021-09-08 — End: 2021-09-08
  Administered 2021-09-08: 240 mg via INTRAVENOUS

## 2021-09-08 MED ORDER — AMISULPRIDE (ANTIEMETIC) 5 MG/2ML IV SOLN: 10.0000 mg | Freq: Once | INTRAVENOUS | Status: DC | PRN

## 2021-09-08 MED ORDER — CHLORHEXIDINE GLUCONATE 0.12 % MT SOLN: 15.0000 mL | Freq: Once | OROMUCOSAL | Status: AC

## 2021-09-08 MED ORDER — ROCURONIUM BROMIDE 10 MG/ML (PF) SYRINGE
PREFILLED_SYRINGE | INTRAVENOUS | Status: AC
  Filled 2021-09-08: qty 10

## 2021-09-08 MED ORDER — DEXAMETHASONE SODIUM PHOSPHATE 10 MG/ML IJ SOLN
INTRAMUSCULAR | Status: AC
  Filled 2021-09-08: qty 1

## 2021-09-08 MED ORDER — ACETAMINOPHEN 500 MG PO TABS
ORAL_TABLET | ORAL | Status: AC
  Filled 2021-09-08: qty 2

## 2021-09-08 MED ORDER — BUPIVACAINE-EPINEPHRINE 0.25% -1:200000 IJ SOLN
INTRAMUSCULAR | Status: DC | PRN
  Administered 2021-09-08: 10 mL

## 2021-09-08 MED ORDER — FENTANYL CITRATE (PF) 100 MCG/2ML IJ SOLN: 25.0000 ug | INTRAMUSCULAR | Status: DC | PRN

## 2021-09-08 MED ORDER — BUPIVACAINE-EPINEPHRINE (PF) 0.25% -1:200000 IJ SOLN
INTRAMUSCULAR | Status: AC
  Filled 2021-09-08: qty 30

## 2021-09-08 MED ORDER — ORAL CARE MOUTH RINSE: 15.0000 mL | Freq: Once | OROMUCOSAL | Status: AC

## 2021-09-08 MED ORDER — ONDANSETRON HCL 4 MG PO TABS: 4.0000 mg | ORAL_TABLET | Freq: Three times a day (TID) | ORAL | 0 refills | Status: DC | PRN

## 2021-09-08 MED ORDER — KETAMINE HCL 50 MG/5ML IJ SOSY
PREFILLED_SYRINGE | INTRAMUSCULAR | Status: AC
  Filled 2021-09-08: qty 5

## 2021-09-08 MED ORDER — LACTATED RINGERS IV SOLN: INTRAVENOUS | Status: DC

## 2021-09-08 MED ORDER — DEXAMETHASONE SODIUM PHOSPHATE 10 MG/ML IJ SOLN
INTRAMUSCULAR | Status: DC | PRN
  Administered 2021-09-08: 4 mg via INTRAVENOUS

## 2021-09-08 MED ORDER — APREPITANT 40 MG PO CAPS
40.0000 mg | ORAL_CAPSULE | Freq: Once | ORAL | Status: AC
  Administered 2021-09-08: 40 mg via ORAL

## 2021-09-08 MED ORDER — BUPIVACAINE LIPOSOME 1.3 % IJ SUSP
INTRAMUSCULAR | Status: AC
  Filled 2021-09-08: qty 20

## 2021-09-08 MED ORDER — 0.9 % SODIUM CHLORIDE (POUR BTL) OPTIME
TOPICAL | Status: DC | PRN
  Administered 2021-09-08: 100 mL

## 2021-09-08 MED ORDER — ONDANSETRON HCL 4 MG/2ML IJ SOLN
INTRAMUSCULAR | Status: DC | PRN
  Administered 2021-09-08: 4 mg via INTRAVENOUS

## 2021-09-08 MED ORDER — CHLORHEXIDINE GLUCONATE 0.12 % MT SOLN
OROMUCOSAL | Status: AC
  Administered 2021-09-08: 15 mL via OROMUCOSAL
  Filled 2021-09-08: qty 15

## 2021-09-08 MED ORDER — PROPOFOL 10 MG/ML IV BOLUS
INTRAVENOUS | Status: AC
  Filled 2021-09-08: qty 20

## 2021-09-08 MED ORDER — BUPIVACAINE-EPINEPHRINE (PF) 0.25% -1:200000 IJ SOLN
INTRAMUSCULAR | Status: DC | PRN
  Administered 2021-09-08: 40 mL via INTRAMUSCULAR

## 2021-09-08 MED ORDER — ONDANSETRON HCL 4 MG/2ML IJ SOLN
INTRAMUSCULAR | Status: AC
  Filled 2021-09-08: qty 2

## 2021-09-08 MED ORDER — EPHEDRINE SULFATE-NACL 50-0.9 MG/10ML-% IV SOSY
PREFILLED_SYRINGE | INTRAVENOUS | Status: DC | PRN
  Administered 2021-09-08: 10 mg via INTRAVENOUS

## 2021-09-08 MED ORDER — PROMETHAZINE HCL 25 MG/ML IJ SOLN: 6.2500 mg | INTRAMUSCULAR | Status: DC | PRN

## 2021-09-08 MED ORDER — HYDROCODONE-ACETAMINOPHEN 5-325 MG PO TABS: 1.0000 | ORAL_TABLET | Freq: Four times a day (QID) | ORAL | 0 refills | Status: AC | PRN

## 2021-09-08 MED ORDER — APREPITANT 40 MG PO CAPS
ORAL_CAPSULE | ORAL | Status: AC
  Filled 2021-09-08: qty 1

## 2021-09-08 MED ORDER — CEFAZOLIN SODIUM-DEXTROSE 2-4 GM/100ML-% IV SOLN
2.0000 g | INTRAVENOUS | Status: AC
  Administered 2021-09-08: 2 g via INTRAVENOUS
  Filled 2021-09-08: qty 100

## 2021-09-08 MED ORDER — ACETAMINOPHEN 500 MG PO TABS
1000.0000 mg | ORAL_TABLET | Freq: Once | ORAL | Status: AC
Start: 2021-09-08 — End: 2021-09-08
  Administered 2021-09-08: 1000 mg via ORAL

## 2021-09-08 MED ORDER — KETAMINE HCL 10 MG/ML IJ SOLN
INTRAMUSCULAR | Status: DC | PRN
  Administered 2021-09-08: 10 mg via INTRAVENOUS

## 2021-09-08 MED ORDER — PROPOFOL 10 MG/ML IV BOLUS
INTRAVENOUS | Status: DC | PRN
  Administered 2021-09-08: 100 mg via INTRAVENOUS

## 2021-09-08 MED ORDER — LIDOCAINE 2% (20 MG/ML) 5 ML SYRINGE
INTRAMUSCULAR | Status: DC | PRN
  Administered 2021-09-08: 100 mg via INTRAVENOUS

## 2021-09-08 MED ORDER — LIDOCAINE 2% (20 MG/ML) 5 ML SYRINGE
INTRAMUSCULAR | Status: AC
  Filled 2021-09-08: qty 5

## 2021-09-08 MED ORDER — SUCCINYLCHOLINE CHLORIDE 200 MG/10ML IV SOSY
PREFILLED_SYRINGE | INTRAVENOUS | Status: AC
  Filled 2021-09-08: qty 10

## 2021-09-08 SURGICAL SUPPLY — 49 items
BAG COUNTER SPONGE SURGICOUNT (BAG) ×2 IMPLANT
CANISTER SUCT 3000ML PPV (MISCELLANEOUS) ×2 IMPLANT
CLSR STERI-STRIP ANTIMIC 1/2X4 (GAUZE/BANDAGES/DRESSINGS) ×2 IMPLANT
CORD BIPOLAR FORCEPS 12FT (ELECTRODE) ×2 IMPLANT
DRAIN CHANNEL 15F RND FF W/TCR (WOUND CARE) IMPLANT
DRAPE INCISE IOBAN 66X45 STRL (DRAPES) ×3 IMPLANT
DRAPE LAPAROTOMY 100X72 PEDS (DRAPES) ×2 IMPLANT
DRAPE POUCH INSTRU U-SHP 10X18 (DRAPES) ×2 IMPLANT
DRAPE SURG 17X23 STRL (DRAPES) ×1 IMPLANT
DRAPE U-SHAPE 47X51 STRL (DRAPES) ×2 IMPLANT
DRSG AQUACEL AG ADV 3.5X 6 (GAUZE/BANDAGES/DRESSINGS) ×1 IMPLANT
DRSG OPSITE POSTOP 4X6 (GAUZE/BANDAGES/DRESSINGS) ×1 IMPLANT
DURAPREP 26ML APPLICATOR (WOUND CARE) ×2 IMPLANT
ELECT CAUTERY BLADE 6.4 (BLADE) ×2 IMPLANT
ELECT PENCIL ROCKER SW 15FT (MISCELLANEOUS) ×2 IMPLANT
ELECT REM PT RETURN 9FT ADLT (ELECTROSURGICAL) ×2
ELECTRODE REM PT RTRN 9FT ADLT (ELECTROSURGICAL) ×1 IMPLANT
GENERATOR PULSE PROCLAIM 5ELIT (Neuro Prosthesis/Implant) IMPLANT
GLOVE SURG ENC MOIS LTX SZ6.5 (GLOVE) ×2 IMPLANT
GLOVE SURG MICRO LTX SZ8.5 (GLOVE) ×3 IMPLANT
GLOVE SURG UNDER POLY LF SZ6.5 (GLOVE) ×2 IMPLANT
GLOVE SURG UNDER POLY LF SZ8.5 (GLOVE) ×2 IMPLANT
GOWN STRL REUS W/ TWL LRG LVL3 (GOWN DISPOSABLE) ×2 IMPLANT
GOWN STRL REUS W/TWL 2XL LVL3 (GOWN DISPOSABLE) ×2 IMPLANT
GOWN STRL REUS W/TWL LRG LVL3 (GOWN DISPOSABLE) ×4
KIT BASIN OR (CUSTOM PROCEDURE TRAY) ×2 IMPLANT
KIT TURNOVER KIT B (KITS) ×2 IMPLANT
NDL SUT 6 .5 CRC .975X.05 MAYO (NEEDLE) ×1 IMPLANT
NEEDLE 22X1 1/2 (OR ONLY) (NEEDLE) ×1 IMPLANT
NEEDLE MAYO TAPER (NEEDLE)
NS IRRIG 1000ML POUR BTL (IV SOLUTION) ×2 IMPLANT
PACK GENERAL/GYN (CUSTOM PROCEDURE TRAY) ×2 IMPLANT
PACK UNIVERSAL I (CUSTOM PROCEDURE TRAY) ×2 IMPLANT
PAD ARMBOARD 7.5X6 YLW CONV (MISCELLANEOUS) ×3 IMPLANT
PROGRAMMER DBS W/MAGNET NMRI (MISCELLANEOUS) ×1 IMPLANT
PULSE GENERATOR PROCLAIM 5ELIT (Neuro Prosthesis/Implant) ×2 IMPLANT
SPONGE T-LAP 4X18 ~~LOC~~+RFID (SPONGE) ×2 IMPLANT
STAPLER VISISTAT 35W (STAPLE) IMPLANT
STRIP CLOSURE SKIN 1/2X4 (GAUZE/BANDAGES/DRESSINGS) IMPLANT
SUT ETHIBOND NAB CT1 #1 30IN (SUTURE) ×3 IMPLANT
SUT MNCRL AB 3-0 PS2 27 (SUTURE) ×3 IMPLANT
SUT VIC AB 1 CT1 18XCR BRD 8 (SUTURE) ×1 IMPLANT
SUT VIC AB 1 CT1 8-18 (SUTURE) ×2
SUT VIC AB 2-0 CT1 18 (SUTURE) ×3 IMPLANT
SYR BULB IRRIG 60ML STRL (SYRINGE) ×2 IMPLANT
SYR CONTROL 10ML LL (SYRINGE) ×1 IMPLANT
TOWEL GREEN STERILE (TOWEL DISPOSABLE) ×2 IMPLANT
TOWEL GREEN STERILE FF (TOWEL DISPOSABLE) ×4 IMPLANT
WATER STERILE IRR 1000ML POUR (IV SOLUTION) ×1 IMPLANT

## 2021-09-08 NOTE — Anesthesia Procedure Notes (Signed)
Procedure Name: Intubation Date/Time: 09/08/2021 10:26 AM Performed by: Daryel Gerald, RN Pre-anesthesia Checklist: Patient identified, Emergency Drugs available, Suction available and Patient being monitored Patient Re-evaluated:Patient Re-evaluated prior to induction Oxygen Delivery Method: Circle System Utilized Preoxygenation: Pre-oxygenation with 100% oxygen Induction Type: IV induction Ventilation: Mask ventilation without difficulty Laryngoscope Size: Mac and 3 Grade View: Grade I Tube type: Oral Tube size: 7.0 mm Number of attempts: 1 Airway Equipment and Method: Stylet and Oral airway Placement Confirmation: ETT inserted through vocal cords under direct vision, positive ETCO2 and breath sounds checked- equal and bilateral Secured at: 20 cm Tube secured with: Tape Dental Injury: Teeth and Oropharynx as per pre-operative assessment

## 2021-09-08 NOTE — Discharge Instructions (Signed)
 Spinal Cord Stimulator Replacement Care After This sheet gives you information about how to care for yourself after your procedure. Your health care provider may also give you more specific instructions. If you have problems or questions, contact your health care provider. What can I expect after the procedure? After the procedure, it is common to have: Soreness or pain. Some swelling in the area where the hardware was removed. A small amount of blood or clear fluid coming from your incision. Follow these instructions at home: If you have a cast: Do not stick anything inside the cast to scratch your skin. Doing that increases your risk of infection. Check the skin around the cast every day. Tell your health care provider about any concerns. You may put lotion on dry skin around the edges of the cast. Do not put lotion on the skin underneath the cast. Keep the cast clean and dry. Do not take baths, swim, or use a hot tub until your health care provider approves. Ask your health care provider if you may take showers. You may only be allowed to take sponge baths. Keep the bandage (dressing) dry until your health care provider says it can be removed. Ok to shower in 5 days.    Incision care  Follow instructions from your health care provider about how to take care of your incision. Make sure you: Wash your hands with soap and water before you change your dressing. If soap and water are not available, use hand sanitizer. Change your dressing as told by your health care provider. Leave stitches (sutures), skin glue, or adhesive strips in place. These skin closures may need to stay in place for 2 weeks or longer. If adhesive strip edges start to loosen and curl up, you may trim the loose edges. Do not remove adhesive strips completely unless your health care provider tells you to do that. Check your incision area every day for signs of infection. Check for: Redness. More swelling or pain. More  fluid or blood. Warmth. Pus or a bad smell. Managing pain, stiffness, and swelling  If directed, put ice on the affected area: Put ice in a plastic bag. Place a towel between your skin and the bag. Leave the ice on for 20 minutes, 2-3 times a day. Move your fingers or toes often to avoid stiffness and to lessen swelling.  Driving Do not drive or use heavy machinery while taking prescription pain medicine. Do not drive for 24 hours if you were given a medicine to help you relax (sedative) during your procedure. Ask your health care provider when it is safe to drive if you have a cast, splint, or boot on the affected limb. Activity Ask your health care provider what activities are safe for you during recovery, and ask what activities you need to avoid. Do not use the injured limb to support your body weight until your health care provider says that you can. Do not play contact sports until your health care provider approves. Do exercises as told by your health care provider. Avoid sitting for a long time without moving. Get up and move around at least every few hours. This will help prevent blood clots. General instructions Do not put pressure on any part of the cast or splint until it is fully hardened. This may take several hours. If you are taking prescription pain medicine, take actions to prevent or treat constipation. Your health care provider may recommend that you: Drink enough fluid to keep your   urine pale yellow. Eat foods that are high in fiber, such as fresh fruits and vegetables, whole grains, and beans. Limit foods that are high in fat and processed sugars, such as fried or sweet foods. Take an over-the-counter or prescription medicine for constipation. Do not use any products that contain nicotine or tobacco, such as cigarettes and e-cigarettes. These can delay bone healing after surgery. If you need help quitting, ask your health care provider. Take over-the-counter and  prescription medicines only as told by your health care provider. Keep all follow-up visits as told by your health care provider. This is important. Contact a health care provider if: You have lasting pain. You have redness around your incision. You have more swelling or pain around your incision. You have more fluid or blood coming from your incision. Your incision feels warm to the touch. You have pus or a bad smell coming from your incision. You are unable to do exercises or physical activity as told by your health care provider. Get help right away if: You have difficulty breathing. You have chest pain. You have severe pain. You have a fever or chills. You have numbness for more than 24 hours in the area where the hardware was removed. Summary After the procedure, it is common to have some pain and swelling in the area where the hardware was removed. Follow instructions from your health care provider about how to take care of your incision. Return to your normal activities as told by your health care provider. Ask your health care provider what activities are safe for you. This information is not intended to replace advice given to you by your health care provider. Make sure you discuss any questions you have with your health care provider. 

## 2021-09-08 NOTE — H&P (Deleted)
  The note originally documented on this encounter has been moved the the encounter in which it belongs.  

## 2021-09-08 NOTE — Brief Op Note (Signed)
09/08/2021  11:02 AM  PATIENT:  Andrey Cota  81 y.o. female  PRE-OPERATIVE DIAGNOSIS:  End of life spinal cord stimulator battery  POST-OPERATIVE DIAGNOSIS:  End of life spinal cord stimulator battery  PROCEDURE:  Procedure(s): SPINAL CORD STIMULATOR BATTERY EXCHANGE (N/A)  SURGEON:  Surgeon(s) and Role:    Melina Schools, MD - Primary  PHYSICIAN ASSISTANT:   ASSISTANTS: Nelson Chimes, PA   ANESTHESIA:   general  EBL:  minimal   BLOOD ADMINISTERED:none  DRAINS: none   LOCAL MEDICATIONS USED:  MARCAINE    and OTHER exparel  SPECIMEN:  No Specimen  DISPOSITION OF SPECIMEN:  N/A  COUNTS:  YES  TOURNIQUET:  * No tourniquets in log *  DICTATION: .Dragon Dictation  PLAN OF CARE: Discharge to home after PACU  PATIENT DISPOSITION:  PACU - hemodynamically stable.

## 2021-09-08 NOTE — Anesthesia Postprocedure Evaluation (Signed)
Anesthesia Post Note  Patient: [name]  Procedure(s) Performed: SPINAL CORD STIMULATOR BATTERY EXCHANGE (Buttocks)     Patient location during evaluation: PACU Anesthesia Type: General Level of consciousness: sedated Pain management: pain level controlled Vital Signs Assessment: post-procedure vital signs reviewed and stable Respiratory status: spontaneous breathing and respiratory function stable Cardiovascular status: stable Postop Assessment: no apparent nausea or vomiting Anesthetic complications: no   No notable events documented.  Last Vitals:  Vitals:   09/08/21 1220 09/08/21 1235  BP: (!) 135/56 (!) 135/56  Pulse: 68 61  Resp: (!) 29 14  Temp:  36.4 C  SpO2: 91% 93%    Last Pain:  Vitals:   09/08/21 1235  TempSrc:   PainSc: 0-No pain                 Sully Dyment DANIEL

## 2021-09-08 NOTE — H&P (Signed)
Subjective:   For location, patient reports bilateral. For quality, patient reports aching and constant. For severity, patient reports pain level 6/10. For alleviating factors, patient reports previous surgery. For associated symptoms, patient reports no weakness, no numbness, no tingling, no radiation down leg, and no change in bowel/bladder habits. For previous surgery, patient reports surgical procedure:. For prior imaging, patient reports no recent studies. SCS battery replacement  Patient Active Problem List   Diagnosis Date Noted   Physical deconditioning 12/06/2020   Hypertensive crisis 11/19/2020   Abnormality of left atrial appendage 09/06/2020   Anxiety 09/06/2020   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) 09/06/2020   Atherosclerotic heart disease of native coronary artery without angina pectoris 09/06/2020   Chronic kidney disease, stage 3 unspecified ([place]) 09/06/2020   Congestive heart failure ([place]) 09/06/2020   Diabetic retinopathy associated with type 2 diabetes mellitus ([place]) 09/06/2020   Gastroesophageal reflux disease with esophagitis 09/06/2020   Hypomagnesemia 09/06/2020   Infectious colitis, enteritis and gastroenteritis 09/06/2020   Insomnia 09/06/2020   Long term (current) use of insulin ([place]) 09/06/2020   Other specified disorders of bone density and structure, other site 09/06/2020   Peripheral venous insufficiency 09/06/2020   Personal history of colonic polyps 09/06/2020   Personal history of other diseases of the digestive system 09/06/2020   Presence of Watchman left atrial appendage closure device 09/06/2020   Pulmonary nodule 09/06/2020   Recurrent falls 09/06/2020   Recurrent major depression ([place]) 09/06/2020   Slow transit constipation 09/06/2020   Upper abdominal pain 09/06/2020   Secondary hypercoagulable state ([place]) 08/05/2020   Acute on chronic respiratory failure with hypoxemia ([place]) 07/22/2020   Pleural effusion 07/22/2020    SOB (shortness of breath) 07/12/2020   ABLA (acute blood loss anemia) 07/12/2020   Persistent atrial fibrillation ([place]) 07/01/2020   Paroxysmal atrial fibrillation ([place]) 04/14/2020   Thoracic back pain 12/16/2019   Muscle weakness 05/01/2018   Impairment of balance 04/25/2018   S/P left TKA 11/05/2017   S/P total knee replacement 11/05/2017   Osteoarthritis 09/06/2017   Pain in left knee 09/06/2017   Diabetes mellitus ([place]) 12/25/2016   Hypercholesterolemia 12/25/2016   Ptosis of eyelid 10/09/2016   Iron deficiency anemia 12/15/2015   Acute GI bleeding 05/13/2015   Acute blood loss anemia 05/13/2015   Normocytic anemia 05/13/2015   Allergic rhinitis 04/06/2015   Asthma, chronic 09/08/2013   Hypoxia 09/08/2013   HTN (hypertension) 09/08/2013   Depression 09/08/2013   DM type 2 with diabetic peripheral neuropathy ([place]) 09/08/2013   Polyneuropathy due to type 2 diabetes mellitus ([place]) 09/08/2013   Flu-like symptoms 09/07/2013   Past Medical History:  Diagnosis Date   Acute GI bleeding 05/13/2015   Anxiety    Arthritis    Asthma    Atrial fibrillation ([place]) 10/29/2017   Bakers cyst, left    Chronic kidney disease    stage 3   Constipation    Diabetes mellitus    type 2   Dysrhythmia    GERD (gastroesophageal reflux disease)    Heart murmur    History of cellulitis    History of dizziness    History of hiatal hernia    tiny 06/15/2016   Hypertension    Iron deficiency anemia    Peripheral vascular disease (H[facility])    PONV (postoperative nausea and vomiting)    Pulmonary nodule     Past Surgical History:  Procedure Laterality Date   APPENDECTOMY  1952   BIOPSY  10/16/2018  Procedure: BIOPSY;  Surgeon: Clarene Essex, MD;  Location: [facility];  Service: Endoscopy;;   BIOPSY  11/26/2019   Procedure: BIOPSY;  Surgeon: Clarene Essex, MD;  Location: [facility];  Service: Endoscopy;;   BIOPSY  07/14/2020   Procedure: BIOPSY;  Surgeon: Ronald Lobo, MD;  Location:  [facility];  Service: Endoscopy;;   CARDIOVERSION N/A 09/09/2020   Procedure: CARDIOVERSION;  Surgeon: Josue Hector, MD;  Location: [facility] ENDOSCOPY;  Service: Cardiovascular;  Laterality: N/A;   CARDIOVERSION N/A 11/18/2020   Procedure: CARDIOVERSION;  Surgeon: Donato Heinz, MD;  Location: [facility] ENDOSCOPY;  Service: Cardiovascular;  Laterality: N/A;   COLONOSCOPY W/ POLYPECTOMY  05/15/2013   ESOPHAGOGASTRODUODENOSCOPY (EGD) WITH PROPOFOL N/A 06/15/2016   Procedure: ESOPHAGOGASTRODUODENOSCOPY (EGD) WITH PROPOFOL;  Surgeon: Clarene Essex, MD;  Location: [facility];  Service: Endoscopy;  Laterality: N/A;   ESOPHAGOGASTRODUODENOSCOPY (EGD) WITH PROPOFOL N/A 10/16/2018   Procedure: ESOPHAGOGASTRODUODENOSCOPY (EGD) WITH PROPOFOL;  Surgeon: Clarene Essex, MD;  Location: [facility];  Service: Endoscopy;  Laterality: N/A;   ESOPHAGOGASTRODUODENOSCOPY (EGD) WITH PROPOFOL N/A 11/26/2019   Procedure: ESOPHAGOGASTRODUODENOSCOPY (EGD) WITH PROPOFOL;  Surgeon: Clarene Essex, MD;  Location: [facility];  Service: Endoscopy;  Laterality: N/A;   ESOPHAGOGASTRODUODENOSCOPY (EGD) WITH PROPOFOL N/A 07/14/2020   Procedure: ESOPHAGOGASTRODUODENOSCOPY (EGD) WITH PROPOFOL;  Surgeon: Ronald Lobo, MD;  Location: [facility];  Service: Endoscopy;  Laterality: N/A;   EYE SURGERY Bilateral    cataracts removed   GI RADIOFREQUENCY ABLATION  10/16/2018   Procedure: GI RADIOFREQUENCY ABLATION;  Surgeon: Clarene Essex, MD;  Location: [facility];  Service: Endoscopy;;   GI RADIOFREQUENCY ABLATION N/A 11/26/2019   Procedure: GI RADIOFREQUENCY ABLATION;  Surgeon: Clarene Essex, MD;  Location: [facility];  Service: Endoscopy;  Laterality: N/A;   HAND SURGERY Left 2010   operated on index and ring finger   HOT HEMOSTASIS N/A 07/14/2020   Procedure: HOT HEMOSTASIS (ARGON PLASMA COAGULATION/BICAP);  Surgeon: Ronald Lobo, MD;  Location: [facility];  Service: Endoscopy;  Laterality: N/A;   INCONTINENCE SURGERY      JOINT REPLACEMENT Right    knee   KNEE ARTHROSCOPY  2003 2005   right knee   KYPHOPLASTY N/A 01/29/2020   Procedure: T8 KYPHOPLASTY;  Surgeon: Melina Schools, MD;  Location: [facility];  Service: Orthopedics;  Laterality: N/A;  60 mins Local with [facility]   LEFT ATRIAL APPENDAGE OCCLUSION N/A 07/01/2020   Procedure: LEFT ATRIAL APPENDAGE OCCLUSION;  Surgeon: Vickie Epley, MD;  Location: [facility];  Service: Cardiovascular;  Laterality: N/A;   mass fallopian tube     SPINAL CORD STIMULATOR BATTERY EXCHANGE N/A 09/09/2015   Procedure: SPINAL CORD STIMULATOR BATTERY EXCHANGE;  Surgeon: Melina Schools, MD;  Location: [place] [facility];  Service: Orthopedics;  Laterality: N/A;   TEE WITHOUT CARDIOVERSION N/A 08/18/2020   Procedure: TRANSESOPHAGEAL ECHOCARDIOGRAM (TEE);  Surgeon: Josue Hector, MD;  Location: [facility] ENDOSCOPY;  Service: Cardiovascular;  Laterality: N/A;   TEE WITHOUT CARDIOVERSION N/A 09/09/2020   Procedure: TRANSESOPHAGEAL ECHOCARDIOGRAM (TEE);  Surgeon: Josue Hector, MD;  Location: [facility] ENDOSCOPY;  Service: Cardiovascular;  Laterality: N/A;   TEE WITHOUT CARDIOVERSION N/A 11/18/2020   Procedure: TRANSESOPHAGEAL ECHOCARDIOGRAM (TEE);  Surgeon: Donato Heinz, MD;  Location: [place];  Service: Cardiovascular;  Laterality: N/A;   TOTAL KNEE ARTHROPLASTY Left 11/05/2017   Procedure: LEFT TOTAL KNEE ARTHROPLASTY;  Surgeon: Paralee Cancel, MD;  Location: [facility];  Service: Orthopedics;  Laterality: Left;  Adductor Block   TUBAL LIGATION     VAGINAL HYSTERECTOMY  1977   1 ovary removed    No current facility-administered medications for this visit.   No current outpatient medications on file.   Facility-Administered Medications Ordered in Other Visits  Medication Dose Route Frequency Provider Last Rate Last Admin   acetaminophen (TYLENOL) 500 MG tablet            aprepitant (EMEND) 40 MG capsule            ceFAZolin (ANCEF) IVPB 2g/100 mL premix  2 g Intravenous 30  min Pre-Op Charlyne Petrin, PA-C       lactated ringers infusion   Intravenous Continuous Duane Boston, MD 10 mL/hr at 09/08/21 0916 Continued from Pre-op at 09/08/21 0916   Allergies  Allergen Reactions   Vasotec Shortness Of Breath and Other (See Comments)    Wheezing, also   Atorvastatin Other (See Comments)    Leg cramps    Codeine Nausea And Vomiting   Cymbalta [Duloxetine Hcl] Itching   Lansoprazole Itching, Swelling, Rash and Other (See Comments)    Redness, swelling of mouth. During 07/12/20-07/15/20 pt tolerated IV protonix infusion with no reactions.   Morphine And Related Itching   Sulfate Itching   Amitriptyline Hcl Other (See Comments)     sleepwalking   Escitalopram Oxalate Itching   Hydrocodone Itching   Iron Other (See Comments)    constipated   Sertraline Hcl Itching   Tramadol Itching   Adhesive [Tape] Rash   Allevyn Adhesive [Wound Dressings] Rash   Scopolamine Rash    Social History   Tobacco Use   Smoking status: Never   Smokeless tobacco: Never  Substance Use Topics   Alcohol use: No    Family History  Problem Relation Age of Onset   Lung cancer Mother    Heart attack Father    Colon cancer Neg Hx    Stroke Neg Hx    Migraines Neg Hx     Review of Systems Pertinent items are noted in HPI.  Objective:   Clinical exam: Raneem is a pleasant individual, who appears younger than their stated age.   She is alert and orientated 3.   No shortness of breath, chest pain.   Abdomen is soft and non-tender, negative use at the surgical center loss of bowel and bladder control, no rebound tenderness.   Negative: skin lesions abrasions contusions  Peripheral pulses: 2+ dorsalis pedis/posterior tibialis pulses bilaterally. Compartment soft and nontender.  Gait pattern:normal  Assistive devices: None  Neuro: 5/5 motor strength in lower extremity bilaterally. Negative straight leg raise test, no clonus, negative Babinski test. Symmetrical 1+  deep tendon reflexes at the knee absent at the Achilles.  Musculoskeletal: Increasing back buttock and neuropathic leg pain since the spinal cord stimulator battery has ceased working. No significant pain over the battery site. The incision is clean dry and intact and well-healed.   Assessment:   Tanice returns today for follow-up. She had the spinal cord stimulator placed several years ago and has been doing very well with it until recently. It was recently interrogated and noted to be at end-of-life according to the representative. The patient returns today because she would like her battery replaced.   Plan:   I have gone over the procedure with her in great detail and both she and her husband have expressed a willingness to move forward with surgery and that all their questions were addressed.  Risks include: Infection, bleeding, nerve damage, damage to the spinal cord stimulator leads that would prohibit  implantation of a new battery. Migration of the leads. Ongoing or worse pain. Risk of anesthesia includes death stroke paralysis nausea.  Treatment plan: We will obtain preoperative medical clearance and move forward with surgery in a timely fashion.

## 2021-09-08 NOTE — Op Note (Signed)
OPERATIVE REPORT  DATE OF SURGERY: 09/08/2021  PATIENT NAME:  [name] MRN: [id number] DOB: May 30, 1941  PCP: Aretta Nip, MD  PRE-OPERATIVE DIAGNOSIS:  Symptomatic orthopedic hardware.  End of life for spinal cord stimulator battery  POST-OPERATIVE DIAGNOSIS: Same  PROCEDURE:   Replace spinal cord stimulator battery  SURGEON:  Melina Schools, MD  PHYSICIAN ASSISTANT: Nelson Chimes, PA  ANESTHESIA:   General  EBL: Minimal   Complications: None  Implants: [email or web address]  BRIEF HISTORY: [name] is a 81 y.o. female who had a successful spinal cord stimulator placement several years ago.  Unfortunately the battery is at end-of-life and is no longer functioning.  Her pain has increased and so she presented to my office for reimplantation of a new battery.  The battery was interrogated and noted to be at end-of-life.  All risks, benefits, and alternatives to surgery were discussed and we elected to move forward with replacing the spinal cord stimulator battery.  PROCEDURE DETAILS: Patient was brought into the operating room and was properly positioned on the operating room table.  After induction with general anesthesia the patient was endotracheally intubated.  A timeout was taken to confirm all important data: including patient, procedure, and the level. Teds, SCD's were applied.   The patient was turned into a lateral decubitus position on a beanbag and held in place.  Axillary roll was placed and all bony prominences were well-padded.  The posterior left gluteal region was prepped and draped in a standard fashion.  The previous incision site was infiltrated with quarter percent Marcaine with epinephrine and the incision was reexcised.  I sharply dissected down to the deep fascia. I then used my Metzenbaum scissors to continue dissecting until I could palpate and then ultimately visualized the battery.  I eventually mobilized the battery and delivered out of the  wound.  The leads were then disconnected and the new battery was obtained.  The leads were cleaned and then placed into the new battery and tightened according manufacture standards.  The battery was then tested and both leads were noted to be functioning.  The battery was then returned back to the same pocket and the excess leads were tucked on the left undersurface of the battery.  Using two #1 Ethibond sutures I secured the battery to the deep fascia.  The wound was then copes irrigated with normal saline.  The wound was then closed in a layered fashion with interrupted #1 Vicryl suture, 2-0 Vicryl suture, and finally 3-0 Monocryl sutures.  Prior to skin closure we did anesthetize the entire area with quarter percent Marcaine with Exparel for postoperative analgesia.  Steri-Strips and dry dressings were applied and the patient was ultimately extubated and transferred to PACU without incident.  The end the case all needle sponge counts were correct.  Melina Schools, MD 09/08/2021 10:57 AM

## 2021-09-08 NOTE — Transfer of Care (Signed)
Immediate Anesthesia Transfer of Care Note  Patient: [name]  Procedure(s) Performed: SPINAL CORD STIMULATOR BATTERY EXCHANGE (Buttocks)  Patient Location: PACU  Anesthesia Type:General  Level of Consciousness: awake, oriented and patient cooperative  Airway & Oxygen Therapy: Patient Spontanous Breathing and Patient connected to face mask oxygen  Post-op Assessment: Report given to RN and Post -op Vital signs reviewed and stable  Post vital signs: Reviewed  Last Vitals:  Vitals Value Taken Time  BP 149/116 09/08/21 1135  Temp    Pulse 73 09/08/21 1139  Resp 18 09/08/21 1139  SpO2 97 % 09/08/21 1139  Vitals shown include unvalidated device data.  Last Pain:  Vitals:   09/08/21 0830  TempSrc:   PainSc: 7       Patients Stated Pain Goal: 2 (45/62/56 3893)  Complications: No notable events documented.

## 2021-09-08 NOTE — Anesthesia Preprocedure Evaluation (Addendum)
Anesthesia Evaluation  Patient identified by MRN, date of birth, ID band Patient awake    Reviewed: Allergy & Precautions, NPO status , Patient's Chart, lab work & pertinent test results, reviewed documented beta blocker date and time   History of Anesthesia Complications (+) PONV and history of anesthetic complications  Airway Mallampati: III  TM Distance: >3 FB Neck ROM: Full    Dental no notable dental hx. (+) Teeth Intact, Dental Advisory Given   Pulmonary asthma ,    Pulmonary exam normal breath sounds clear to auscultation       Cardiovascular hypertension, Pt. on medications and Pt. on home beta blockers + CAD and + Peripheral Vascular Disease  Normal cardiovascular exam Rhythm:Regular Rate:Normal  08/2020: 1. Since Watchman FLX well sealed and no thrombus proceded with [facility] with patient only on plavix. [place] x 1 with 150J Converted to NSR with no cmplicaitons. 2. Left ventricular ejection fraction, by estimation, is 60 to 65%. The left ventricle has normal function. 3. Right ventricular systolic function is normal. The right ventricular size is normal. 4. 27 mm Watchman FLX device in good position with no leak. No thrombus and average compression in multiple views 28%. Left atrial size was moderately dilated. No left atrial/left atrial appendage thrombus was detected. 5. Right atrial size was moderately dilated. 6. The mitral valve is abnormal. Mild mitral valve regurgitation. 7. The aortic valve is tricuspid. Aortic valve regurgitation is mild. Mild to moderate aortic valve sclerosis/calcification is present, without any evidence of aortic stenosis.   Neuro/Psych PSYCHIATRIC DISORDERS Anxiety Depression  Neuromuscular disease (peripheral neuropathy)    GI/Hepatic Neg liver ROS, hiatal hernia, GERD  ,  Endo/Other  negative endocrine ROSdiabetes  Renal/GU Renal InsufficiencyRenal disease  negative genitourinary    Musculoskeletal  (+) Arthritis ,   Abdominal   Peds negative pediatric ROS (+)  Hematology  (+) anemia ,   Anesthesia Other Findings   Reproductive/Obstetrics negative OB ROS                            Anesthesia Physical  Anesthesia Plan  ASA: 3  Anesthesia Plan: General   Post-op Pain Management: Tylenol PO (pre-op)   Induction: Intravenous  PONV Risk Score and Plan: 4 or greater and Treatment may vary due to age or medical condition, Dexamethasone, Aprepitant and Ondansetron  Airway Management Planned: Oral ETT  Additional Equipment: None  Intra-op Plan:   Post-operative Plan: Extubation in OR  Informed Consent: I have reviewed the patients History and Physical, chart, labs and discussed the procedure including the risks, benefits and alternatives for the proposed anesthesia with the patient or authorized representative who has indicated his/her understanding and acceptance.     Dental advisory given  Plan Discussed with: CRNA and Anesthesiologist  Anesthesia Plan Comments:         Anesthesia Quick Evaluation

## 2021-09-09 ENCOUNTER — Encounter (HOSPITAL_COMMUNITY): Payer: Self-pay | Admitting: Orthopedic Surgery

## 2021-09-10 ENCOUNTER — Other Ambulatory Visit: Payer: Self-pay | Admitting: Cardiology

## 2021-09-12 ENCOUNTER — Ambulatory Visit: Payer: Medicare PPO | Admitting: Podiatry

## 2021-09-12 MED ORDER — MAGNESIUM OXIDE -MG SUPPLEMENT 400 (240 MG) MG PO TABS: 200.0000 mg | ORAL_TABLET | Freq: Every day | ORAL | 1 refills | Status: DC

## 2021-09-22 ENCOUNTER — Encounter: Payer: Self-pay | Admitting: Podiatry

## 2021-09-22 ENCOUNTER — Ambulatory Visit (INDEPENDENT_AMBULATORY_CARE_PROVIDER_SITE_OTHER): Payer: Medicare PPO | Admitting: Podiatry

## 2021-09-22 DIAGNOSIS — E1142 Type 2 diabetes mellitus with diabetic polyneuropathy: Secondary | ICD-10-CM

## 2021-09-22 DIAGNOSIS — E1169 Type 2 diabetes mellitus with other specified complication: Secondary | ICD-10-CM

## 2021-09-22 DIAGNOSIS — B351 Tinea unguium: Secondary | ICD-10-CM | POA: Diagnosis not present

## 2021-09-22 DIAGNOSIS — H9190 Unspecified hearing loss, unspecified ear: Secondary | ICD-10-CM | POA: Insufficient documentation

## 2021-09-22 NOTE — Progress Notes (Signed)
°  Subjective:  Patient ID: [name], female    DOB: 05/25/1941,  MRN: [id number]  Chief Complaint  Patient presents with   Nail Problem    Trim nails    81 y.o. female presents with the above complaint. History confirmed with patient. Requests care of her nails today. Last A1c 6.7. Last AM BS 121  Objective:  Physical Exam: warm, good capillary refill, onychomycosis of the toenails with pain to palpation of the nailbeds, no trophic changes or ulcerative lesions, normal DP and PT pulses, and absent protective sensation. Xerosis bilat. Left Foot: No POP left dorsal midfoot. Right Foot: normal exam, no swelling, tenderness, instability; ligaments intact, full range of motion of all ankle/foot joints   Assessment:   1. Onychomycosis of multiple toenails with type 2 diabetes mellitus and peripheral neuropathy ([place])    Plan:  Patient was evaluated and treated and all questions answered.  Onychomycosis, Diabetes and DPN -Patient is diabetic with a qualifying condition for at risk foot care.  Procedure: Nail Debridement Type of Debridement: manual, sharp debridement. Instrumentation: Nail nipper, rotary burr. Number of Nails: 10  Arthritis -No pain today.   Return in about 3 months (around 12/21/2021).

## 2021-09-28 DIAGNOSIS — H43393 Other vitreous opacities, bilateral: Secondary | ICD-10-CM | POA: Diagnosis not present

## 2021-09-28 DIAGNOSIS — H04123 Dry eye syndrome of bilateral lacrimal glands: Secondary | ICD-10-CM | POA: Diagnosis not present

## 2021-09-28 DIAGNOSIS — H353131 Nonexudative age-related macular degeneration, bilateral, early dry stage: Secondary | ICD-10-CM | POA: Diagnosis not present

## 2021-09-28 DIAGNOSIS — Z961 Presence of intraocular lens: Secondary | ICD-10-CM | POA: Diagnosis not present

## 2021-09-28 DIAGNOSIS — H0102A Squamous blepharitis right eye, upper and lower eyelids: Secondary | ICD-10-CM | POA: Diagnosis not present

## 2021-09-28 DIAGNOSIS — E119 Type 2 diabetes mellitus without complications: Secondary | ICD-10-CM | POA: Diagnosis not present

## 2021-09-28 DIAGNOSIS — H11123 Conjunctival concretions, bilateral: Secondary | ICD-10-CM | POA: Diagnosis not present

## 2021-09-28 DIAGNOSIS — H0102B Squamous blepharitis left eye, upper and lower eyelids: Secondary | ICD-10-CM | POA: Diagnosis not present

## 2021-09-28 DIAGNOSIS — H1045 Other chronic allergic conjunctivitis: Secondary | ICD-10-CM | POA: Diagnosis not present

## 2021-10-24 ENCOUNTER — Other Ambulatory Visit: Payer: Self-pay

## 2021-10-24 ENCOUNTER — Ambulatory Visit (INDEPENDENT_AMBULATORY_CARE_PROVIDER_SITE_OTHER): Payer: Medicare PPO | Admitting: Cardiology

## 2021-10-24 ENCOUNTER — Encounter: Payer: Self-pay | Admitting: Cardiology

## 2021-10-24 VITALS — BP 120/64 | HR 70 | Ht 64.0 in | Wt 137.2 lb

## 2021-10-24 DIAGNOSIS — Z8719 Personal history of other diseases of the digestive system: Secondary | ICD-10-CM | POA: Diagnosis not present

## 2021-10-24 DIAGNOSIS — I480 Paroxysmal atrial fibrillation: Secondary | ICD-10-CM | POA: Diagnosis not present

## 2021-10-24 DIAGNOSIS — Z95818 Presence of other cardiac implants and grafts: Secondary | ICD-10-CM | POA: Diagnosis not present

## 2021-10-24 NOTE — Patient Instructions (Signed)
Medication Instructions:  Your physician recommends that you continue on your current medications as directed. Please refer to the Current Medication list given to you today. *If you need a refill on your cardiac medications before your next appointment, please call your pharmacy*  Lab Work: None. If you have labs (blood work) drawn today and your tests are completely normal, you will receive your results only by: MyChart Message (if you have MyChart) OR A paper copy in the mail If you have any lab test that is abnormal or we need to change your treatment, we will call you to review the results.  Testing/Procedures: None.  Follow-Up: At [facility], you and your health needs are our priority.  As part of our continuing mission to provide you with exceptional heart care, we have created designated Provider Care Teams.  These Care Teams include your primary Cardiologist (physician) and Advanced Practice Providers (APPs -  Physician Assistants and Nurse Practitioners) who all work together to provide you with the care you need, when you need it.  Your physician wants you to follow-up in: 12 months with Cameron Lambert, MD     You will receive a reminder letter in the mail two months in advance. If you don't receive a letter, please call our office to schedule the follow-up appointment.  We recommend signing up for the patient portal called "MyChart".  Sign up information is provided on this After Visit Summary.  MyChart is used to connect with patients for Virtual Visits (Telemedicine).  Patients are able to view lab/test results, encounter notes, upcoming appointments, etc.  Non-urgent messages can be sent to your provider as well.   To learn more about what you can do with MyChart, go to [email or web address].    Any Other Special Instructions Will Be Listed Below (If Applicable).         

## 2021-10-24 NOTE — Progress Notes (Signed)
Electrophysiology Office Follow up Visit Note:    Date:  10/24/2021   ID:  [name], DOB 12/05/1940, MRN [id number]  PCP:  Aretta Nip, MD  [place] Cardiologist:  Candee Furbish, MD  [facility] Electrophysiologist:  Vickie Epley, MD    Interval History:    [name] is a 81 y.o. female who presents for a follow up visit. She had a watchman implanted July 01, 2020.  Her transesophageal echo on November 18, 2020 showed a well-seated left atrial appendage occlusion device without leak or thrombus. They were last seen in clinic 02/11/2021.  Since their last appointment, they followed up with Dr. Marlou Porch 05/27/2021 where they were doing well.  Today she is accompanied by a family member. Overall, she is feeling pretty good. However, she reports having no energy, which seems to be worsening. She also notes having an isolated episode of dyspnea.  Generally she is also suffering from insomnia. She may be up until 2 AM before she falls asleep, and then wake up 4 hours later. Also, she notes taking Lasix at 3:30 PM the other day, and complaining of urinary frequency every 2 hours through the night.  Since her last appointment she was started on Trulicity, which she seems to be tolerating.  She denies any chest pain, or peripheral edema. No lightheadedness, headaches, syncope, orthopnea, or PND.      Past Medical History:  Diagnosis Date   Acute GI bleeding 05/13/2015   Anxiety    Arthritis    Asthma    Atrial fibrillation ([place]) 10/29/2017   Bakers cyst, left    Chronic kidney disease    stage 3   Constipation    Diabetes mellitus    type 2   Dysrhythmia    GERD (gastroesophageal reflux disease)    Heart murmur    History of cellulitis    History of dizziness    History of hiatal hernia    tiny 06/15/2016   Hypertension    Iron deficiency anemia    Peripheral vascular disease (HCC)    PONV (postoperative nausea and vomiting)    Pulmonary nodule      Past Surgical History:  Procedure Laterality Date   APPENDECTOMY  1952   BIOPSY  10/16/2018   Procedure: BIOPSY;  Surgeon: Clarene Essex, MD;  Location: [facility];  Service: Endoscopy;;   BIOPSY  11/26/2019   Procedure: BIOPSY;  Surgeon: Clarene Essex, MD;  [facility]: [facility];  Service: Endoscopy;;   BIOPSY  07/14/2020   Procedure: BIOPSY;  Surgeon: Ronald Lobo, MD;  [facility]: [facility];  Service: Endoscopy;;   CARDIOVERSION N/A 09/09/2020   Procedure: CARDIOVERSION;  Surgeon: Josue Hector, MD;  Location: [place];  Service: Cardiovascular;  Laterality: N/A;   CARDIOVERSION N/A 11/18/2020   Procedure: CARDIOVERSION;  Surgeon: Donato Heinz, MD;  Location: [place];  Service: Cardiovascular;  Laterality: N/A;   COLONOSCOPY W/ POLYPECTOMY  05/15/2013   ESOPHAGOGASTRODUODENOSCOPY (EGD) WITH PROPOFOL N/A 06/15/2016   Procedure: ESOPHAGOGASTRODUODENOSCOPY (EGD) WITH PROPOFOL;  Surgeon: Clarene Essex, MD;  [facility];  Service: Endoscopy;  Laterality: N/A;   ESOPHAGOGASTRODUODENOSCOPY (EGD) WITH PROPOFOL N/A 10/16/2018   Procedure: ESOPHAGOGASTRODUODENOSCOPY (EGD) WITH PROPOFOL;  Surgeon: Clarene Essex, MD;  [facility];  Service: Endoscopy;  Laterality: N/A;   ESOPHAGOGASTRODUODENOSCOPY (EGD) WITH PROPOFOL N/A 11/26/2019   Procedure: ESOPHAGOGASTRODUODENOSCOPY (EGD) WITH PROPOFOL;  Surgeon: Clarene Essex, MD;  [facility];  Service: Endoscopy;  Laterality: N/A;   ESOPHAGOGASTRODUODENOSCOPY (EGD)  WITH PROPOFOL N/A 07/14/2020   Procedure: ESOPHAGOGASTRODUODENOSCOPY (EGD) WITH PROPOFOL;  Surgeon: Ronald Lobo, MD;  [facility];  Service: Endoscopy;  Laterality: N/A;   EYE SURGERY Bilateral    cataracts removed   GI RADIOFREQUENCY ABLATION  10/16/2018   Procedure: GI RADIOFREQUENCY ABLATION;  Surgeon: Clarene Essex, MD;  [facility]: [facility];  Service: Endoscopy;;   GI RADIOFREQUENCY ABLATION N/A 11/26/2019   Procedure:  GI RADIOFREQUENCY ABLATION;  Surgeon: Clarene Essex, MD;  [facility];  Service: Endoscopy;  Laterality: N/A;   HAND SURGERY Left 2010   operated on index and ring finger   HOT HEMOSTASIS N/A 07/14/2020   Procedure: HOT HEMOSTASIS (ARGON PLASMA COAGULATION/BICAP);  Surgeon: Ronald Lobo, MD;  Location: [facility];  Service: Endoscopy;  Laterality: N/A;   INCONTINENCE SURGERY     JOINT REPLACEMENT Right    knee   KNEE ARTHROSCOPY  2003 2005   right knee   KYPHOPLASTY N/A 01/29/2020   Procedure: T8 KYPHOPLASTY;  Surgeon: Melina Schools, MD;  Location: [place];  Service: Orthopedics;  Laterality: N/A;  60 mins Local with [facility]   LEFT ATRIAL APPENDAGE OCCLUSION N/A 07/01/2020   Procedure: LEFT ATRIAL APPENDAGE OCCLUSION;  Surgeon: Vickie Epley, MD;  Location: [place] [facility];  Service: Cardiovascular;  Laterality: N/A;   mass fallopian tube     SPINAL CORD STIMULATOR BATTERY EXCHANGE N/A 09/09/2015   Procedure: SPINAL CORD STIMULATOR BATTERY EXCHANGE;  Surgeon: Melina Schools, MD;  Location: [place];  Service: Orthopedics;  Laterality: N/A;   SPINAL CORD STIMULATOR BATTERY EXCHANGE N/A 09/08/2021   Procedure: SPINAL CORD STIMULATOR BATTERY EXCHANGE;  Surgeon: Melina Schools, MD;  Location: [place];  Service: Orthopedics;  Laterality: N/A;   TEE WITHOUT CARDIOVERSION N/A 08/18/2020   Procedure: TRANSESOPHAGEAL ECHOCARDIOGRAM (TEE);  Surgeon: Josue Hector, MD;  Location: [facility];  Service: Cardiovascular;  Laterality: N/A;   TEE WITHOUT CARDIOVERSION N/A 09/09/2020   Procedure: TRANSESOPHAGEAL ECHOCARDIOGRAM (TEE);  Surgeon: Josue Hector, MD;  Location: [facility];  Service: Cardiovascular;  Laterality: N/A;   TEE WITHOUT CARDIOVERSION N/A 11/18/2020   Procedure: TRANSESOPHAGEAL ECHOCARDIOGRAM (TEE);  Surgeon: Donato Heinz, MD;  Location: [place];  Service: Cardiovascular;  Laterality: N/A;   TOTAL KNEE ARTHROPLASTY Left 11/05/2017   Procedure:  LEFT TOTAL KNEE ARTHROPLASTY;  Surgeon: Paralee Cancel, MD;  [facility]: [facility];  Service: Orthopedics;  Laterality: Left;  Adductor Block   TUBAL LIGATION     VAGINAL HYSTERECTOMY  1977   1 ovary removed    Current Medications: Current Meds  Medication Sig   albuterol (PROAIR HFA) 108 (90 BASE) MCG/ACT inhaler Inhale 2 puffs into the lungs every 4 (four) hours as needed for wheezing or shortness of breath.   ALPRAZolam (XANAX) 0.25 MG tablet Take 0.25 mg by mouth at bedtime.    aspirin EC 81 MG tablet Take 1 tablet (81 mg total) by mouth daily. Swallow whole.   Continuous Blood Gluc Sensor (FREESTYLE LIBRE 14 DAY SENSOR) MISC Inject 1 patch into the skin every 14 (fourteen) days.   diphenhydrAMINE (BENADRYL) 25 MG tablet Take 25 mg by mouth daily as needed for allergies.   docusate sodium (COLACE) 100 MG capsule Take 100 mg by mouth daily as needed for moderate constipation.   DROPLET PEN NEEDLES 32G X 4 MM MISC    esomeprazole (NEXIUM) 40 MG capsule Take 40 mg by mouth at bedtime.   fluticasone (FLONASE) 50 MCG/ACT nasal spray Place 1 spray into both nostrils  daily as needed for allergies.    furosemide (LASIX) 40 MG tablet TAKE 1 TABLET BY MOUTH EVERY DAY   gabapentin (NEURONTIN) 300 MG capsule Take 300-600 mg by mouth See admin instructions. Take 300 mg in the morning and 600 mg at bedtime   insulin glargine (LANTUS) 100 UNIT/ML Solostar Pen Inject 22 Units into the skin at bedtime.   ipratropium (ATROVENT) 0.06 % nasal spray Place 1 spray into both nostrils daily.   KLOR-CON M20 20 MEQ tablet TAKE 2 TABLETS BY MOUTH DAILY   magnesium oxide (MAG-OX) 400 (240 Mg) MG tablet Take 0.5 tablets (200 mg total) by mouth daily.   metoprolol tartrate (LOPRESSOR) 100 MG tablet Take 1 tablet (100 mg total) by mouth 2 (two) times daily.   Multiple Vitamins-Minerals (PRESERVISION AREDS 2 PO) Take 1 capsule by mouth 2 (two) times daily.    NOVOLOG FLEXPEN 100 UNIT/ML FlexPen Inject 10-14 Units into the  skin See admin instructions. Inject 10-14 units into the skin, PER SLIDING SCALE, in the morning, noon, and bedtime   ondansetron (ZOFRAN) 4 MG tablet Take 1 tablet (4 mg total) by mouth every 8 (eight) hours as needed for nausea or vomiting.   pantoprazole (PROTONIX) 40 MG tablet Take 40 mg by mouth daily as needed (Heartburn).   Pitavastatin Calcium (LIVALO) 1 MG TABS Take 2 mg by mouth at bedtime.   sodium chloride (OCEAN) 0.65 % SOLN nasal spray Place 1 spray into both nostrils as needed for congestion.   TRULICITY 8.58 IF/0.2DX SOPN Inject 0.75 mg into the skin once a week.   venlafaxine (EFFEXOR) 37.5 MG tablet Take 37.5 mg by mouth 2 (two) times daily.     Allergies:   Vasotec, Atorvastatin, Codeine, Cymbalta [duloxetine hcl], Lansoprazole, Morphine and related, Sulfate, Amitriptyline hcl, Escitalopram oxalate, Hydrocodone, Iron, Sertraline hcl, Tramadol, Adhesive [tape], Allevyn adhesive [wound dressings], and Scopolamine   Social History   Socioeconomic History   Marital status: Married    Spouse name: Johnny   Number of children: 2   Years of education: 16   Highest education level: Not on file  Occupational History    Comment: retired Pharmacist, hospital, IT sales professional  Tobacco Use   Smoking status: Never   Smokeless tobacco: Never  Scientific laboratory technician Use: Never used  Substance and Sexual Activity   Alcohol use: No   Drug use: No   Sexual activity: Not Currently    Birth control/protection: Surgical  Other Topics Concern   Not on file  Social History Narrative   Lives with husband   Caffeine use- none   Social Determinants of Health   Financial Resource Strain: Not on file  Food Insecurity: Not on file  Transportation Needs: Not on file  Physical Activity: Not on file  Stress: Not on file  Social Connections: Not on file     Family History: The patient's family history includes Heart attack in her father; Lung cancer in her mother. There is no history of Colon cancer,  Stroke, or Migraines.  ROS:   Please see the history of present illness.    (+) Fatigue (+) Insomnia (+) Shortness of breath (+) Nocturia All other systems reviewed and are negative.  EKGs/Labs/Other Studies Reviewed:    The following studies were reviewed today:  TEE 11/18/2020:  1. Left ventricular ejection fraction, by estimation, is 55 to 60%. The  left ventricle has normal function.   2. Right ventricular systolic function is normal. The right ventricular  size is  normal. There is mildly elevated pulmonary artery systolic  pressure.   3. A small pericardial effusion is present.   4. Right atrial size was moderately dilated.   5. The mitral valve is normal in structure. Mild mitral valve  regurgitation.   6. The aortic valve is tricuspid. Aortic valve regurgitation is not  visualized.   7. Tricuspid valve regurgitation is moderate.   8. S/p Watchman LAA occlusion device, appears well sealed. No thrombus  seen. Left atrial size was moderately dilated.   Monitor 10/28/2020: HR 46-129bpm, average 68bpm. AF 100% of monitoring period. Rare ventricular ectopy.   Echo 07/13/2020:  1. Left ventricular ejection fraction, by estimation, is 55 to 60%. The  left ventricle has normal function. The left ventricle has no regional  wall motion abnormalities. There is mild asymmetric left ventricular  hypertrophy of the posterior-lateral segment. Left ventricular diastolic parameters are indeterminate. Elevated left ventricular end-diastolic pressure.   2. Right ventricular systolic function is mildly reduced. The right  ventricular size is mildly enlarged. There is normal pulmonary artery  systolic pressure. The estimated right ventricular systolic pressure is  32.4 mmHg.   3. Left atrial size was moderately dilated.   4. Right atrial size was severely dilated.   5. The mitral valve is abnormal. Trivial mitral valve regurgitation.  Moderate mitral annular calcification.   6.  Tricuspid valve regurgitation is mild to moderate.   7. The aortic valve is grossly normal. Aortic valve regurgitation is not  visualized. No aortic stenosis is present.   8. The inferior vena cava is normal in size with greater than 50%  respiratory variability, suggesting right atrial pressure of 3 mmHg.   9. S/p 27 mm Watchman FLX 07/01/20, which is not well visualized or  assessed on this transthoracic echo.   Left Atrial Appendage Occlusion 07/01/2020: CONCLUSIONS:  1.  Successful implantation of a WATCHMAN left atrial appendage occlusive device (27 mm) 2.  TEE demonstrating no LAA thrombus 3.  Small pericardial effusion at the beginning of the case.  Slight increase in fluid in the transverse sinus by the end of the procedure, stable throughout the entire procedure.   Cardiac CTA 04/23/2020: FINDINGS: A 120 kV prospective scan was triggered in the descending thoracic aorta at 111 HU's. Gantry rotation speed was 280 msecs and collimation was .9 mm. No beta blockade and no NTG was given. The 3D data set was reconstructed in 5% intervals of the 60-80 % of the R-R cycle. Diastolic phases were analyzed on a dedicated work station using MPR, MIP and VRT modes. The patient received 80 cc of contrast.   There is normal pulmonary vein drainage into the left atrium (2 on the right and 2 on the left) with ostial measurements as follows:   RUPV: 22.9 x 15.7 mm   RLPV: 16.9 x 13.8 mm   LUPV: 18.1 x 12.5 mm   LLPV: 15.9 x 11.8 mm   The left atrial appendage is very large with mixed chicken wing / broccoli morphology, with 1 major lobe, ostial size 22 x 21 mm and length 50 mm. There is no thrombus in the left atrial appendage.   The esophagus runs to the left from the left atrial midline and is in the proximity to the ostia of the LUPV and LLPV.   Aorta: Normal caliber. No dissection. Mild diffuse calcifications.   Aortic Valve: Trileaflet. Minimal calcifications.   Coronary  Arteries: Normal coronary origin. Right dominance. The study was performed without use  of NTG and insufficient for plaque evaluation.   Calcium score is 65 that represents 46 percentile for age/sex.   IMPRESSION: 1. There is normal pulmonary vein drainage into the left atrium.   2. The left atrial appendage is very large with mixed chicken wing / broccoli morphology, with 1 major lobe, ostial size 22 x 21 mm and length 50 mm. There is no thrombus in the left atrial appendage.   3. The esophagus runs to the left from the left atrial midline and is in the proximity to the ostia of the LUPV and LLPV.  EKG:  EKG is personally reviewed.  10/24/2020: Sinus rhythm  Recent Labs: 11/19/2020: ALT 28; B Natriuretic Peptide 257.1; Magnesium 2.0 09/08/2021: BUN 19; Creatinine, Ser 0.99; Hemoglobin 13.4; Platelets 250; Potassium 4.1; Sodium 139   Recent Lipid Panel No results found for: CHOL, TRIG, HDL, CHOLHDL, VLDL, LDLCALC, LDLDIRECT  Physical Exam:    VS:  BP 120/64    Pulse 70    Ht 5\' 4"  (1.626 m)    Wt 137 lb 3.2 oz (62.2 kg)    SpO2 96%    BMI 23.55 kg/m     Wt Readings from Last 3 Encounters:  10/24/21 137 lb 3.2 oz (62.2 kg)  09/08/21 138 lb (62.6 kg)  05/27/21 141 lb (64 kg)     GEN: Well nourished, well developed in no acute distress HEENT: Normal NECK: No JVD; No carotid bruits LYMPHATICS: No lymphadenopathy CARDIAC: RRR, no murmurs, rubs, gallops RESPIRATORY:  Clear to auscultation without rales, wheezing or rhonchi  ABDOMEN: Soft, non-tender, non-distended MUSCULOSKELETAL:  No edema; No deformity  SKIN: Warm and dry NEUROLOGIC:  Alert and oriented x 3 PSYCHIATRIC:  Normal affect        ASSESSMENT:    1. Paroxysmal atrial fibrillation (HCC)   2. History of GI bleed   3. Presence of Watchman left atrial appendage closure device    PLAN:    In order of problems listed above:   #Paroxysmal atrial fibrillation In normal rhythm today.  Has a watchman in place.   Continue aspirin 81 mg by mouth daily. Follow-up in 1 year or sooner as needed.  APP okay.  #History of GI bleeding Watchman in place.  On aspirin 81 mg by mouth monotherapy.  No recent bleeding.   Medication Adjustments/Labs and Tests Ordered: Current medicines are reviewed at length with the patient today.  Concerns regarding medicines are outlined above.  No orders of the defined types were placed in this encounter.  No orders of the defined types were placed in this encounter.   I,Mathew Stumpf,acting as a Education administrator for Vickie Epley, MD.,have documented all relevant documentation on the behalf of Vickie Epley, MD,as directed by  Vickie Epley, MD while in the presence of Vickie Epley, MD.  I, Vickie Epley, MD, have reviewed all documentation for this visit. The documentation on 10/24/21 for the exam, diagnosis, procedures, and orders are all accurate and complete.   Signed, Lars Mage, MD, [facility], [facility] 10/24/2021 9:41 PM    Electrophysiology [facility]

## 2021-10-27 DIAGNOSIS — E1142 Type 2 diabetes mellitus with diabetic polyneuropathy: Secondary | ICD-10-CM | POA: Diagnosis not present

## 2021-11-07 DIAGNOSIS — E1151 Type 2 diabetes mellitus with diabetic peripheral angiopathy without gangrene: Secondary | ICD-10-CM | POA: Diagnosis not present

## 2021-11-07 DIAGNOSIS — E1142 Type 2 diabetes mellitus with diabetic polyneuropathy: Secondary | ICD-10-CM | POA: Diagnosis not present

## 2021-11-07 DIAGNOSIS — E261 Secondary hyperaldosteronism: Secondary | ICD-10-CM | POA: Diagnosis not present

## 2021-11-07 DIAGNOSIS — D6869 Other thrombophilia: Secondary | ICD-10-CM | POA: Diagnosis not present

## 2021-11-07 DIAGNOSIS — F330 Major depressive disorder, recurrent, mild: Secondary | ICD-10-CM | POA: Diagnosis not present

## 2021-11-07 DIAGNOSIS — I509 Heart failure, unspecified: Secondary | ICD-10-CM | POA: Diagnosis not present

## 2021-11-07 DIAGNOSIS — Z794 Long term (current) use of insulin: Secondary | ICD-10-CM | POA: Diagnosis not present

## 2021-11-07 DIAGNOSIS — I4891 Unspecified atrial fibrillation: Secondary | ICD-10-CM | POA: Diagnosis not present

## 2021-11-07 DIAGNOSIS — I110 Hypertensive heart disease with heart failure: Secondary | ICD-10-CM | POA: Diagnosis not present

## 2021-11-15 ENCOUNTER — Other Ambulatory Visit: Payer: Self-pay | Admitting: Internal Medicine

## 2021-11-15 ENCOUNTER — Encounter: Payer: Self-pay | Admitting: Cardiology

## 2021-11-15 DIAGNOSIS — Z1231 Encounter for screening mammogram for malignant neoplasm of breast: Secondary | ICD-10-CM

## 2021-11-15 NOTE — Telephone Encounter (Signed)
Spoke to the patient about her Magnesium. Advised I would check with the MD to see if he wants to get some lab work and see how she should continue with the medication going forward. The patient also asked about cholesterol labs and when those are due for her meds that were changed in the fall. Verbalized understanding and agreement.  ?

## 2021-11-16 ENCOUNTER — Encounter: Payer: Self-pay | Admitting: Cardiology

## 2021-11-29 ENCOUNTER — Ambulatory Visit
Admission: RE | Admit: 2021-11-29 | Discharge: 2021-11-29 | Disposition: A | Discharge status: Home / Self Care | Payer: Medicare PPO | Source: Ambulatory Visit | Attending: Internal Medicine | Admitting: Internal Medicine

## 2021-11-29 DIAGNOSIS — Z1231 Encounter for screening mammogram for malignant neoplasm of breast: Secondary | ICD-10-CM

## 2021-12-19 ENCOUNTER — Ambulatory Visit: Payer: Medicare PPO | Admitting: Podiatry

## 2021-12-22 ENCOUNTER — Encounter: Payer: Self-pay | Admitting: Podiatry

## 2021-12-22 ENCOUNTER — Ambulatory Visit (INDEPENDENT_AMBULATORY_CARE_PROVIDER_SITE_OTHER): Payer: Medicare PPO | Admitting: Podiatry

## 2021-12-22 ENCOUNTER — Ambulatory Visit: Payer: Medicare PPO | Admitting: Podiatry

## 2021-12-22 ENCOUNTER — Other Ambulatory Visit: Payer: Self-pay | Admitting: *Deleted

## 2021-12-22 DIAGNOSIS — M79675 Pain in left toe(s): Secondary | ICD-10-CM | POA: Diagnosis not present

## 2021-12-22 DIAGNOSIS — E1142 Type 2 diabetes mellitus with diabetic polyneuropathy: Secondary | ICD-10-CM | POA: Diagnosis not present

## 2021-12-22 DIAGNOSIS — B351 Tinea unguium: Secondary | ICD-10-CM

## 2021-12-22 DIAGNOSIS — M79674 Pain in right toe(s): Secondary | ICD-10-CM | POA: Diagnosis not present

## 2021-12-22 NOTE — Progress Notes (Signed)
?  Subjective:  ?Patient ID: [name], female    DOB: 12/29/1940,  MRN: [id number] ? ?[name] presents to clinic today for at risk foot care with history of diabetic neuropathy and painful elongated mycotic toenails 1-5 bilaterally which are tender when wearing enclosed shoe gear. Pain is relieved with periodic professional debridement. ? ?Patient states blood glucose was 119 mg/dl today.   ? ?PCP is Kathalene Frames, MD , and last visit was December, 2022. ? ?Allergies  ?Allergen Reactions  ? Vasotec Shortness Of Breath and Other (See Comments)  ?  Wheezing, also  ? Atorvastatin Other (See Comments)  ?  Leg cramps ?  ? Codeine Nausea And Vomiting  ? Cymbalta [Duloxetine Hcl] Itching  ? Lansoprazole Itching, Swelling, Rash and Other (See Comments)  ?  Redness, swelling of mouth. During 07/12/20-07/15/20 pt tolerated IV protonix infusion with no reactions.  ? Morphine And Related Itching  ? Sulfate Itching  ? Amitriptyline Hcl Other (See Comments)  ?   sleepwalking  ? Escitalopram Oxalate Itching  ? Hydrocodone Itching  ? Iron Other (See Comments)  ?  constipated  ? Sertraline Hcl Itching  ? Tramadol Itching  ? Adhesive [Tape] Rash  ? Allevyn Adhesive [Wound Dressings] Rash  ? Scopolamine Rash  ? ? ?Review of Systems: Negative except as noted in the HPI. ?Objective:  ? ?Constitutional [name] is a pleasant 81 y.o. Caucasian female, WD, WN in NAD. AAO x 3.   ?Vascular CFT <3 seconds b/l LE. Palpable DP pulse(s) b/l LE. Palpable PT pulse(s) b/l LE. Pedal hair absent. No pain with calf compression b/l. Lower extremity skin temperature gradient within normal limits. No edema noted b/l LE. No cyanosis or clubbing noted b/l LE.  ?Neurologic Normal speech. Oriented to person, place, and time. Protective sensation diminished with 10g monofilament b/l.  ?Dermatologic Pedal integument with normal turgor, texture and tone BLE. No open wounds b/l LE. No interdigital macerations noted b/l LE. Toenails  1-5 b/l elongated, discolored, dystrophic, thickened, crumbly with subungual debris and tenderness to dorsal palpation.  ?Orthopedic: Muscle strength 5/5 to all lower extremity muscle groups bilaterally. No pain, crepitus or joint limitation noted with ROM bilateral LE. No gross bony deformities bilaterally. Patient ambulates independent of any assistive aids.  ? ?Radiographs: None ? ?Assessment:  ? ?1. Pain due to onychomycosis of toenails of both feet   ?2. Diabetic peripheral neuropathy associated with type 2 diabetes mellitus ([place])   ? ?Plan:  ?Patient was evaluated and treated and all questions answered. ?Consent given for treatment as described below: ?-Patient was evaluated and treated. All patient's and/or POA's questions/concerns answered on today's visit. ?-Continue foot and shoe inspections daily. Monitor blood glucose per PCP/Endocrinologist's recommendations. ?-Mycotic toenails 1-5 bilaterally were debrided in length and girth with sterile nail nippers and dremel without incident. ?-Patient/POA to call should there be question/concern in the interim. ? ?Return in about 3 months (around 03/23/2022). ? ?Marzetta Board, DPM ?

## 2021-12-26 ENCOUNTER — Other Ambulatory Visit: Payer: Self-pay | Admitting: Cardiology

## 2022-01-09 DIAGNOSIS — Z794 Long term (current) use of insulin: Secondary | ICD-10-CM | POA: Diagnosis not present

## 2022-01-09 DIAGNOSIS — E1142 Type 2 diabetes mellitus with diabetic polyneuropathy: Secondary | ICD-10-CM | POA: Diagnosis not present

## 2022-01-16 DIAGNOSIS — E1142 Type 2 diabetes mellitus with diabetic polyneuropathy: Secondary | ICD-10-CM | POA: Diagnosis not present

## 2022-01-30 ENCOUNTER — Encounter: Payer: Self-pay | Admitting: Cardiology

## 2022-02-07 ENCOUNTER — Encounter: Payer: Self-pay | Admitting: Physician Assistant

## 2022-02-07 ENCOUNTER — Ambulatory Visit (INDEPENDENT_AMBULATORY_CARE_PROVIDER_SITE_OTHER): Payer: Medicare PPO | Admitting: Physician Assistant

## 2022-02-07 VITALS — BP 170/90 | HR 69 | Ht 64.0 in | Wt 138.8 lb

## 2022-02-07 DIAGNOSIS — I5033 Acute on chronic diastolic (congestive) heart failure: Secondary | ICD-10-CM

## 2022-02-07 DIAGNOSIS — I4821 Permanent atrial fibrillation: Secondary | ICD-10-CM | POA: Diagnosis not present

## 2022-02-07 DIAGNOSIS — I2510 Atherosclerotic heart disease of native coronary artery without angina pectoris: Secondary | ICD-10-CM | POA: Diagnosis not present

## 2022-02-07 DIAGNOSIS — I10 Essential (primary) hypertension: Secondary | ICD-10-CM

## 2022-02-07 NOTE — Patient Instructions (Signed)
Medication Instructions:   INCREASE Lasix one (1) tablet by mouth ( 40 mg) twice daily X 3 days than go back to one and one half tablets ( 60 mg) daily.   INCREASE Potassium two (2) tablets by mouth ( 40 mEq) twice daily X 3 days than go back to two tablets ( 40 mEq) in the am and one tablet ( 20 mEq) pm.  *If you need a refill on your cardiac medications before your next appointment, please call your pharmacy*   Lab Work:  TODAY!!!!! BMET/PRO BNP/CBC  Your physician recommends that you return for lab work [place], JUNE 22, You can come in on the day of your appointment anytime between 7:30-4:30.  If you have labs (blood work) drawn today and your tests are completely normal, you will receive your results only by: [place] (if you have MyChart) OR A paper copy in the mail If you have any lab test that is abnormal or we need to change your treatment, we will call you to review the results.   Testing/Procedures:  None ordered.   Follow-Up: At [facility], you and your health needs are our priority.  As part of our continuing mission to provide you with exceptional heart care, we have created designated Provider Care Teams.  These Care Teams include your primary Cardiologist (physician) and Advanced Practice Providers (APPs -  Physician Assistants and Nurse Practitioners) who all work together to provide you with the care you need, when you need it.  We recommend signing up for the patient portal called "MyChart".  Sign up information is provided on this After Visit Summary.  MyChart is used to connect with patients for Virtual Visits (Telemedicine).  Patients are able to view lab/test results, encounter notes, upcoming appointments, etc.  Non-urgent messages can be sent to your provider as well.   To learn more about what you can do with MyChart, go to [email or web address].    Your next appointment:   2 week(s)  The format for your next appointment:   In  Person  Provider:   Candee Furbish, MD     Other Instructions  Please send in blood pressure readings X 1 week either call @ [phone] or send to mychart.   Important Information About Sugar

## 2022-02-07 NOTE — Assessment & Plan Note (Signed)
She was not felt to be a candidate for long-term anticoagulation given history of prior GI bleeding.  Obtain CBC, BMET today.  She is status post Watchman device.  Continue aspirin 81 mg daily.  Follow-up with EP as planned.

## 2022-02-07 NOTE — Progress Notes (Signed)
Cardiology Office Note:    Date:  02/07/2022   ID:  [name], DOB Jan 27, 1941, MRN [id number]  PCP:  Kathalene Frames, MD  [facility] Providers Cardiologist:  Candee Furbish, MD Electrophysiologist:  Vickie Epley, MD    Referring MD: Kathalene Frames, *   Chief Complaint:  Shortness of Breath    Patient Profile: Persistent atrial fibrillation/flutter Hx of failed DCCV S/p LAAOD [place]) Nov 2021 (HFpEF) heart failure with preserved ejection fraction  Coronary artery calcification Cardiac CT 03/2020: CAC score 65 (46 percentile) Hypertension Chronic kidney disease  Diabetes mellitus GERD Hx of GI bleeds  Not a good long term candidate for anticoagulation   Prior CV Studies: Transesophageal echocardiogram 11/18/2020 EF 55-60, normal RVSF, mildly elevated PASP, small pericardial effusion, moderate RAE, mild MR, s/p Watchman LAA occlusion device well-sealed  ZIO monitor 10/28/2020 HR 42-129, average 68; atrial fibrillation 100%; rare ventricular ectopy  Echocardiogram 07/13/2020  EF 55-60, no RWMA, mild asymmetric LVH, mildly reduced RVSF, normal PASP, RVSP 34.1, moderate LAE, severe RAE, trivial MR, mild to moderate TR, s/p Watchman LAA closure device   History of Present Illness:   [name] is a 81 y.o. female with the above problem list.  She was last seen by Dr. Marlou Porch in September 2022 and Dr. Quentin Ore in February 2023.  She contacted our office recently via [place] with symptoms of increased lower extremity swelling, abdominal swelling and increased shortness of breath.  She is seen for further evaluation.  She is here with her husband.  She notes increasing shortness of breath with exertion as well as increasing abdominal girth.  She has had lower extremity edema as well.  This seems to improve with elevation.  She has had some discomfort in her chest at times.  It feels more like acid reflux.  She has not had exertional chest pain.  She has not  had syncope.        Past Medical History:  Diagnosis Date   Acute GI bleeding 05/13/2015   Anxiety    Arthritis    Asthma    Atrial fibrillation ([place]) 10/29/2017   Bakers cyst, left    Chronic kidney disease    stage 3   Constipation    Diabetes mellitus    type 2   Dysrhythmia    GERD (gastroesophageal reflux disease)    Heart murmur    History of cellulitis    History of dizziness    History of hiatal hernia    tiny 06/15/2016   Hypertension    Iron deficiency anemia    Peripheral vascular disease (HCC)    PONV (postoperative nausea and vomiting)    Pulmonary nodule    Current Medications: Current Meds  Medication Sig   albuterol (PROAIR HFA) 108 (90 BASE) MCG/ACT inhaler Inhale 2 puffs into the lungs every 4 (four) hours as needed for wheezing or shortness of breath.   ALPRAZolam (XANAX) 0.25 MG tablet Take 0.25 mg by mouth at bedtime.    aspirin EC 81 MG tablet Take 1 tablet (81 mg total) by mouth daily. Swallow whole.   Continuous Blood Gluc Sensor (FREESTYLE LIBRE 14 DAY SENSOR) MISC Inject 1 patch into the skin every 14 (fourteen) days.   diphenhydrAMINE (BENADRYL) 25 MG tablet Take 25 mg by mouth daily as needed for allergies.   docusate sodium (COLACE) 100 MG capsule Take 100 mg by mouth daily as needed for moderate constipation.   DROPLET PEN NEEDLES 32G X 4  MM MISC    fluticasone (FLONASE) 50 MCG/ACT nasal spray Place 1 spray into both nostrils daily as needed for allergies.    furosemide (LASIX) 40 MG tablet TAKE 1 TABLET BY MOUTH EVERY DAY   gabapentin (NEURONTIN) 300 MG capsule Take 300-600 mg by mouth See admin instructions. Take 300 mg in the morning and 600 mg at bedtime   insulin glargine (LANTUS) 100 UNIT/ML Solostar Pen Inject 22 Units into the skin at bedtime.   ipratropium (ATROVENT) 0.06 % nasal spray Place 1 spray into both nostrils daily.   JARDIANCE 10 MG TABS tablet Take by mouth.   KLOR-CON M20 20 MEQ tablet TAKE 2 TABLETS BY MOUTH DAILY    magnesium oxide (MAG-OX) 400 (240 Mg) MG tablet TAKE 1/2 A TABLET (200 MG TOTAL) BY MOUTH DAILY.   metoprolol tartrate (LOPRESSOR) 100 MG tablet Take 1 tablet (100 mg total) by mouth 2 (two) times daily.   Multiple Vitamins-Minerals (PRESERVISION AREDS 2 PO) Take 1 capsule by mouth 2 (two) times daily.    NOVOLOG FLEXPEN 100 UNIT/ML FlexPen Inject 10-14 Units into the skin See admin instructions. Inject 10-14 units into the skin, PER SLIDING SCALE, in the morning, noon, and bedtime   omega-3 acid ethyl esters (LOVAZA) 1 g capsule Take 1 g by mouth daily.   ondansetron (ZOFRAN) 4 MG tablet Take 1 tablet (4 mg total) by mouth every 8 (eight) hours as needed for nausea or vomiting.   pantoprazole (PROTONIX) 40 MG tablet Take 40 mg by mouth daily as needed (Heartburn).   Pitavastatin Calcium (LIVALO) 1 MG TABS Take 2 mg by mouth at bedtime.   sodium chloride (OCEAN) 0.65 % SOLN nasal spray Place 1 spray into both nostrils as needed for congestion.   TRULICITY 1.02 VO/5.3GU SOPN Inject 0.75 mg into the skin once a week.   venlafaxine (EFFEXOR) 37.5 MG tablet Take 37.5 mg by mouth 2 (two) times daily.   [DISCONTINUED] esomeprazole (NEXIUM) 40 MG capsule Take 40 mg by mouth at bedtime.    Allergies:   Vasotec, Atorvastatin, Codeine, Cymbalta [duloxetine hcl], Lansoprazole, Morphine and related, Sulfate, Amitriptyline hcl, Escitalopram oxalate, Hydrocodone, Iron, Sertraline hcl, Tramadol, Adhesive [tape], Allevyn adhesive [wound dressings], and Scopolamine   Social History   Tobacco Use   Smoking status: Never   Smokeless tobacco: Never  Vaping Use   Vaping Use: Never used  Substance Use Topics   Alcohol use: No   Drug use: No    Family Hx: The patient's family history includes Heart attack in her father; Lung cancer in her mother. There is no history of Colon cancer, Stroke, or Migraines.  Review of Systems  Respiratory:  Negative for cough and wheezing.   Gastrointestinal:  Negative for  hematochezia.  Genitourinary:  Negative for hematuria.     EKGs/Labs/Other Test Reviewed:    EKG:  EKG is  ordered today.  The ekg ordered today demonstrates NSR, HR 66, normal axis, nonspecific ST-T wave changes, QTc 427  Recent Labs: 09/08/2021: BUN 19; Creatinine, Ser 0.99; Hemoglobin 13.4; Platelets 250; Potassium 4.1; Sodium 139   Recent Lipid Panel No results for input(s): "CHOL", "TRIG", "HDL", "VLDL", "LDLCALC", "LDLDIRECT" in the last 8760 hours.   Risk Assessment/Calculations/Metrics:    CHA2DS2-VASc Score = 7   This indicates a 11.2% annual risk of stroke. The patient's score is based upon: CHF History: 1 HTN History: 1 Diabetes History: 1 Stroke History: 0 Vascular Disease History: 1 Age Score: 2 Gender Score: 1  HYPERTENSION CONTROL Vitals:   02/07/22 1400 02/07/22 1715  BP: (!) 150/68 (!) 170/90    The patient's blood pressure is elevated above target today.  The following intervention was performed to address the patient's elevated BP:  - A current anti-hypertensive medication was adjusted today.       Physical Exam:    VS:  BP (!) 170/90   Pulse 69   Ht '5\' 4"'$  (1.626 m)   Wt 138 lb 12.8 oz (63 kg)   SpO2 94%   BMI 23.82 kg/m     Wt Readings from Last 3 Encounters:  02/07/22 138 lb 12.8 oz (63 kg)  10/24/21 137 lb 3.2 oz (62.2 kg)  09/08/21 138 lb (62.6 kg)    Constitutional:      Appearance: Healthy appearance. Not in distress.  Neck:     Vascular: JVD normal.  Pulmonary:     Effort: Pulmonary effort is normal.     Breath sounds: No wheezing. Bibasilar Rales present.  Cardiovascular:     Normal rate. Regular rhythm. Normal S1. Normal S2.      Murmurs: There is no murmur.  Edema:    Peripheral edema present.    Ankle: bilateral trace edema of the ankle. Abdominal:     Palpations: Abdomen is soft.  Skin:    General: Skin is warm and dry.  Neurological:     Mental Status: Alert and oriented to person, place and time.          ASSESSMENT & PLAN:   (HFpEF) heart failure with preserved ejection fraction ([place]) She presents with increasing shortness of breath as well as some mild chest discomfort.  She has evidence of volume excess on exam.  I suspect all of her symptoms are related to decompensated heart failure.  I have recommended increasing furosemide to 40 mg twice daily for 3 days and then decrease to furosemide 60 mg daily.  Increase potassium to 40 mEq twice daily x3 days then reduce to 40 mEq in the morning and 20 mill equivalents in the evening.  Obtain BMET, BNP, CBC today.  Repeat BMET 1 week.  Consider SGLT2 inhibitor or spironolactone at follow-up.  CAD (coronary artery disease) Prior CT with evidence of coronary calcification.  She has had some chest discomfort that seems more like indigestion than anything else.  She has not had exertional symptoms.  Electrocardiogram does not demonstrate any acute changes.  Proceed with diuresis initially.  If she continues to have chest discomfort, consider stress testing.  Continue aspirin 81 mg daily, pitavastatin 2 mg at bedtime.  Permanent atrial fibrillation (HCC) She was not felt to be a candidate for long-term anticoagulation given history of prior GI bleeding.  Obtain CBC, BMET today.  She is status post Watchman device.  Continue aspirin 81 mg daily.  Follow-up with EP as planned.  HTN (hypertension) Blood pressure is uncontrolled.  Adjust furosemide as noted.  She will monitor her blood pressure and send blood pressure readings for review.  Consider addition of spironolactone if blood pressure remains uncontrolled.           Dispo:  Return for 2-3 weeks with Dr. Marlou Porch.   Medication Adjustments/Labs and Tests Ordered: Current medicines are reviewed at length with the patient today.  Concerns regarding medicines are outlined above.  Tests Ordered: Orders Placed This Encounter  Procedures   Basic Metabolic Panel (BMET)   Basic Metabolic Panel (BMET)   Pro b  natriuretic peptide   CBC  EKG 12-Lead   Medication Changes: No orders of the defined types were placed in this encounter.  Signed, Richardson Dopp, PA-C  02/07/2022 5:17 PM    [facility] [address], [place], [place]  [address] Phone: [phone]; Fax: [phone]

## 2022-02-07 NOTE — Assessment & Plan Note (Signed)
She presents with increasing shortness of breath as well as some mild chest discomfort.  She has evidence of volume excess on exam.  I suspect all of her symptoms are related to decompensated heart failure.  I have recommended increasing furosemide to 40 mg twice daily for 3 days and then decrease to furosemide 60 mg daily.  Increase potassium to 40 mEq twice daily x3 days then reduce to 40 mEq in the morning and 20 mill equivalents in the evening.  Obtain BMET, BNP, CBC today.  Repeat BMET 1 week.  Consider SGLT2 inhibitor or spironolactone at follow-up.

## 2022-02-07 NOTE — Assessment & Plan Note (Signed)
Prior CT with evidence of coronary calcification.  She has had some chest discomfort that seems more like indigestion than anything else.  She has not had exertional symptoms.  Electrocardiogram does not demonstrate any acute changes.  Proceed with diuresis initially.  If she continues to have chest discomfort, consider stress testing.  Continue aspirin 81 mg daily, pitavastatin 2 mg at bedtime.

## 2022-02-07 NOTE — Assessment & Plan Note (Signed)
Blood pressure is uncontrolled.  Adjust furosemide as noted.  She will monitor her blood pressure and send blood pressure readings for review.  Consider addition of spironolactone if blood pressure remains uncontrolled.

## 2022-02-08 LAB — CBC
Hematocrit: 45.3 % (ref 34.0–46.6)
Hemoglobin: 14.7 g/dL (ref 11.1–15.9)
MCH: 28.7 pg (ref 26.6–33.0)
MCHC: 32.5 g/dL (ref 31.5–35.7)
MCV: 89 fL (ref 79–97)
Platelets: 205 10*3/uL (ref 150–450)
RBC: 5.12 x10E6/uL (ref 3.77–5.28)
RDW: 13.5 % (ref 11.7–15.4)
WBC: 7.3 10*3/uL (ref 3.4–10.8)

## 2022-02-08 LAB — BASIC METABOLIC PANEL
BUN/Creatinine Ratio: 21 (ref 12–28)
BUN: 26 mg/dL (ref 8–27)
CO2: 27 mmol/L (ref 20–29)
Calcium: 9.8 mg/dL (ref 8.7–10.3)
Chloride: 96 mmol/L (ref 96–106)
Creatinine, Ser: 1.23 mg/dL — ABNORMAL HIGH (ref 0.57–1.00)
Glucose: 70 mg/dL (ref 70–99)
Potassium: 4.2 mmol/L (ref 3.5–5.2)
Sodium: 139 mmol/L (ref 134–144)
eGFR: 44 mL/min/{1.73_m2} — ABNORMAL LOW (ref >59)

## 2022-02-08 LAB — PRO B NATRIURETIC PEPTIDE: NT-Pro BNP: 643 pg/mL (ref 0–738)

## 2022-02-13 ENCOUNTER — Other Ambulatory Visit: Payer: Self-pay

## 2022-02-13 ENCOUNTER — Other Ambulatory Visit: Payer: Medicare PPO

## 2022-02-13 DIAGNOSIS — Z79899 Other long term (current) drug therapy: Secondary | ICD-10-CM

## 2022-02-13 DIAGNOSIS — E7800 Pure hypercholesterolemia, unspecified: Secondary | ICD-10-CM

## 2022-02-13 DIAGNOSIS — I4821 Permanent atrial fibrillation: Secondary | ICD-10-CM

## 2022-02-13 DIAGNOSIS — I5033 Acute on chronic diastolic (congestive) heart failure: Secondary | ICD-10-CM

## 2022-02-13 DIAGNOSIS — I10 Essential (primary) hypertension: Secondary | ICD-10-CM

## 2022-02-13 LAB — BASIC METABOLIC PANEL
BUN/Creatinine Ratio: 20 (ref 12–28)
BUN: 27 mg/dL (ref 8–27)
CO2: 29 mmol/L (ref 20–29)
Calcium: 10.1 mg/dL (ref 8.7–10.3)
Chloride: 99 mmol/L (ref 96–106)
Creatinine, Ser: 1.36 mg/dL — ABNORMAL HIGH (ref 0.57–1.00)
Glucose: 97 mg/dL (ref 70–99)
Potassium: 3.7 mmol/L (ref 3.5–5.2)
Sodium: 143 mmol/L (ref 134–144)
eGFR: 39 mL/min/{1.73_m2} — ABNORMAL LOW (ref >59)

## 2022-02-15 ENCOUNTER — Telehealth: Payer: Self-pay | Admitting: *Deleted

## 2022-02-15 DIAGNOSIS — Z79899 Other long term (current) drug therapy: Secondary | ICD-10-CM

## 2022-02-15 NOTE — Telephone Encounter (Signed)
-----   Message from Liliane Shi, [place] sent at 02/14/2022  4:53 PM EDT ----- Creatinine increased more.  K+ normal.  PLAN:  -Decrease Lasix to 40 mg once daily  -Decrease K+ to 40 mEq once daily  -BMET at f/u with Dr. Marlou Porch next week.  Richardson Dopp, PA-C    02/14/2022 4:51 PM

## 2022-02-16 ENCOUNTER — Other Ambulatory Visit: Payer: Medicare PPO

## 2022-02-21 ENCOUNTER — Ambulatory Visit (INDEPENDENT_AMBULATORY_CARE_PROVIDER_SITE_OTHER): Payer: Medicare PPO | Admitting: Cardiology

## 2022-02-21 ENCOUNTER — Encounter: Payer: Self-pay | Admitting: Cardiology

## 2022-02-21 DIAGNOSIS — R5383 Other fatigue: Secondary | ICD-10-CM

## 2022-02-21 DIAGNOSIS — I480 Paroxysmal atrial fibrillation: Secondary | ICD-10-CM

## 2022-02-21 DIAGNOSIS — I5033 Acute on chronic diastolic (congestive) heart failure: Secondary | ICD-10-CM

## 2022-02-21 NOTE — Progress Notes (Addendum)
Cardiology Office Note:    Date:  03/02/2022   ID:  [name], DOB 08/02/1941, MRN [id number]  PCP:  Kathalene Frames, MD   [place]  Cardiologist:  Candee Furbish, MD  Advanced Practice Provider:  No care team member to display Electrophysiologist:  Vickie Epley, MD       Referring MD: Kathalene Frames, *    History of Present Illness:    [name] is a 81 y.o. female here for the follow-up of atrial fibrillation/atrial flutter, and hypertension.    On 02/07/2022 she was seen by Richardson Dopp, PA-C with concerns of increasing shortness of breath and mild chest discomfort. It was suspected her symptoms were related to decompensated heart failure. Furosemide was increased to 40 mg BID for 3 days, and then decreased to 60 mg daily.  Potassium was increased to 40 mEq BID for three days, then 40 mEq in the morning and 20 mEq in the evening. It was felt her chest discomfort seemed more like indigestion; her EKG showed no acute changes. On 02/15/2022 labs showed increased creatinine and normal potassium. Planned to decrease Lasix to 40 mg once daily, decrease potassium to 40 mEq once daily, and order BMET at follow-up.  She had postconversion acute on chronic diastolic heart failure contributed by withholding diuretic therapy.  And catecholamine surge related to cardioversion.  She also had IV fluids during procedure.  Lasix was administered for pulmonary edema.  She remained in normal rhythm in the ER seen by Dr. Tamala Julian in cardiology team.  Also sees Dr. Quentin Ore with electrophysiology.  She previously had failed cardioversion prior to amiodarone.  27 mm watchman present.  EF 65%.  (11/23/2020): Her EKG demonstrates atrial fibrillation/flutter once again heart rate 73 bpm.  Once again this was with amiodarone fully loaded post cardioversion.  See below for details.  At her last appointment she was feeling okay aside from some difficulty sleeping  and arthritis in her bilateral hands. No medication changes made.  Today, she states that she is not feeling well due to a lack of energy. She was barely able to complete her gardening tasks this morning. She knows that her fatigue is not due to her blood sugars as they have been stable at home. She reports her last A1C was 5.7.  She is concerned that her blood pressure has been labile. She presents a BP log showing readings mostly in the 120s, personally reviewed. She notes that it was 170/90 at her last appointment, and today in clinic it is 130/70.  Especially late in the afternoons she notices worsening edema. She is taking furosemide daily. When she is unable to see the veins in her feet she can tell that she has fluid retention. She routinely monitors her weight at home. Her creatinine was 1.3; stable.  When she goes to bed at night, her palpitations ("flip-flop") become noticeable. Additionally, she complains of occasional shortness of breath.  She denies any chest pain. No lightheadedness, headaches, syncope, orthopnea, or PND.   Past Medical History:  Diagnosis Date   Acute GI bleeding 05/13/2015   Anxiety    Arthritis    Asthma    Atrial fibrillation ([place]) 10/29/2017   Bakers cyst, left    Chronic kidney disease    stage 3   Constipation    Diabetes mellitus    type 2   Dysrhythmia    GERD (gastroesophageal reflux disease)    Heart murmur  History of cellulitis    History of dizziness    History of hiatal hernia    tiny 06/15/2016   Hypertension    Iron deficiency anemia    Peripheral vascular disease (HCC)    PONV (postoperative nausea and vomiting)    Pulmonary nodule     Past Surgical History:  Procedure Laterality Date   APPENDECTOMY  1952   BIOPSY  10/16/2018   Procedure: BIOPSY;  Surgeon: Clarene Essex, MD;  Location: [facility];  Service: Endoscopy;;   BIOPSY  11/26/2019   Procedure: BIOPSY;  Surgeon: Clarene Essex, MD;  Location: [facility];  Service:  Endoscopy;;   BIOPSY  07/14/2020   Procedure: BIOPSY;  Surgeon: Ronald Lobo, MD;  Location: [facility];  Service: Endoscopy;;   CARDIOVERSION N/A 09/09/2020   Procedure: CARDIOVERSION;  Surgeon: Josue Hector, MD;  Location: [facility] ENDOSCOPY;  Service: Cardiovascular;  Laterality: N/A;   CARDIOVERSION N/A 11/18/2020   Procedure: CARDIOVERSION;  Surgeon: Donato Heinz, MD;  Location: [facility] ENDOSCOPY;  Service: Cardiovascular;  Laterality: N/A;   COLONOSCOPY W/ POLYPECTOMY  05/15/2013   ESOPHAGOGASTRODUODENOSCOPY (EGD) WITH PROPOFOL N/A 06/15/2016   Procedure: ESOPHAGOGASTRODUODENOSCOPY (EGD) WITH PROPOFOL;  Surgeon: Clarene Essex, MD;  Location: [facility];  Service: Endoscopy;  Laterality: N/A;   ESOPHAGOGASTRODUODENOSCOPY (EGD) WITH PROPOFOL N/A 10/16/2018   Procedure: ESOPHAGOGASTRODUODENOSCOPY (EGD) WITH PROPOFOL;  Surgeon: Clarene Essex, MD;  Location: [facility];  Service: Endoscopy;  Laterality: N/A;   ESOPHAGOGASTRODUODENOSCOPY (EGD) WITH PROPOFOL N/A 11/26/2019   Procedure: ESOPHAGOGASTRODUODENOSCOPY (EGD) WITH PROPOFOL;  Surgeon: Clarene Essex, MD;  Location: [facility];  Service: Endoscopy;  Laterality: N/A;   ESOPHAGOGASTRODUODENOSCOPY (EGD) WITH PROPOFOL N/A 07/14/2020   Procedure: ESOPHAGOGASTRODUODENOSCOPY (EGD) WITH PROPOFOL;  Surgeon: Ronald Lobo, MD;  Location: [facility];  Service: Endoscopy;  Laterality: N/A;   EYE SURGERY Bilateral    cataracts removed   GI RADIOFREQUENCY ABLATION  10/16/2018   Procedure: GI RADIOFREQUENCY ABLATION;  Surgeon: Clarene Essex, MD;  Location: [facility];  Service: Endoscopy;;   GI RADIOFREQUENCY ABLATION N/A 11/26/2019   Procedure: GI RADIOFREQUENCY ABLATION;  Surgeon: Clarene Essex, MD;  Location: [facility];  Service: Endoscopy;  Laterality: N/A;   HAND SURGERY Left 2010   operated on index and ring finger   HOT HEMOSTASIS N/A 07/14/2020   Procedure: HOT HEMOSTASIS (ARGON PLASMA COAGULATION/BICAP);  Surgeon: Ronald Lobo,  MD;  Location: [facility];  Service: Endoscopy;  Laterality: N/A;   INCONTINENCE SURGERY     JOINT REPLACEMENT Right    knee   KNEE ARTHROSCOPY  2003 2005   right knee   KYPHOPLASTY N/A 01/29/2020   Procedure: T8 KYPHOPLASTY;  Surgeon: Melina Schools, MD;  Location: [place];  Service: Orthopedics;  Laterality: N/A;  60 mins Local with [facility]   LEFT ATRIAL APPENDAGE OCCLUSION N/A 07/01/2020   Procedure: LEFT ATRIAL APPENDAGE OCCLUSION;  Surgeon: Vickie Epley, MD;  Location: [place] [facility];  Service: Cardiovascular;  Laterality: N/A;   mass fallopian tube     SPINAL CORD STIMULATOR BATTERY EXCHANGE N/A 09/09/2015   Procedure: SPINAL CORD STIMULATOR BATTERY EXCHANGE;  Surgeon: Melina Schools, MD;  Location: [place];  Service: Orthopedics;  Laterality: N/A;   SPINAL CORD STIMULATOR BATTERY EXCHANGE N/A 09/08/2021   Procedure: SPINAL CORD STIMULATOR BATTERY EXCHANGE;  Surgeon: Melina Schools, MD;  Location: [place];  Service: Orthopedics;  Laterality: N/A;   TEE WITHOUT CARDIOVERSION N/A 08/18/2020   Procedure: TRANSESOPHAGEAL ECHOCARDIOGRAM (TEE);  Surgeon: Josue Hector, MD;  Location: [facility] ENDOSCOPY;  Service: Cardiovascular;  Laterality: N/A;   TEE WITHOUT CARDIOVERSION N/A 09/09/2020   Procedure: TRANSESOPHAGEAL ECHOCARDIOGRAM (TEE);  Surgeon: Josue Hector, MD;  Location: [facility] ENDOSCOPY;  Service: Cardiovascular;  Laterality: N/A;   TEE WITHOUT CARDIOVERSION N/A 11/18/2020   Procedure: TRANSESOPHAGEAL ECHOCARDIOGRAM (TEE);  Surgeon: Donato Heinz, MD;  Location: [place];  Service: Cardiovascular;  Laterality: N/A;   TOTAL KNEE ARTHROPLASTY Left 11/05/2017   Procedure: LEFT TOTAL KNEE ARTHROPLASTY;  Surgeon: Paralee Cancel, MD;  Location: [facility];  Service: Orthopedics;  Laterality: Left;  Adductor Block   TUBAL LIGATION     VAGINAL HYSTERECTOMY  1977   1 ovary removed    Current Medications: Current Meds  Medication Sig   albuterol (PROAIR HFA) 108 (90 BASE)  MCG/ACT inhaler Inhale 2 puffs into the lungs every 4 (four) hours as needed for wheezing or shortness of breath.   aspirin EC 81 MG tablet Take 1 tablet (81 mg total) by mouth daily. Swallow whole.   Continuous Blood Gluc Sensor (FREESTYLE LIBRE 14 DAY SENSOR) MISC Inject 1 patch into the skin every 14 (fourteen) days.   diphenhydrAMINE (BENADRYL) 25 MG tablet Take 25 mg by mouth daily as needed for allergies.   docusate sodium (COLACE) 100 MG capsule Take 100 mg by mouth daily as needed for moderate constipation.   DROPLET PEN NEEDLES 32G X 4 MM MISC    fluticasone (FLONASE) 50 MCG/ACT nasal spray Place 1 spray into both nostrils daily as needed for allergies.    furosemide (LASIX) 40 MG tablet TAKE 1 TABLET BY MOUTH EVERY DAY   gabapentin (NEURONTIN) 300 MG capsule Take 300-600 mg by mouth See admin instructions. Take 300 mg in the morning and 600 mg at bedtime   insulin glargine (LANTUS) 100 UNIT/ML Solostar Pen Inject 22 Units into the skin at bedtime.   ipratropium (ATROVENT) 0.06 % nasal spray Place 1 spray into both nostrils daily.   JARDIANCE 10 MG TABS tablet Take by mouth.   KLOR-CON M20 20 MEQ tablet TAKE 2 TABLETS BY MOUTH DAILY   magnesium oxide (MAG-OX) 400 (240 Mg) MG tablet TAKE 1/2 A TABLET (200 MG TOTAL) BY MOUTH DAILY.   metoprolol tartrate (LOPRESSOR) 100 MG tablet Take 1 tablet (100 mg total) by mouth 2 (two) times daily.   Multiple Vitamins-Minerals (PRESERVISION AREDS 2 PO) Take 1 capsule by mouth 2 (two) times daily.    NOVOLOG FLEXPEN 100 UNIT/ML FlexPen Inject 10-14 Units into the skin See admin instructions. Inject 10-14 units into the skin, PER SLIDING SCALE, in the morning, noon, and bedtime   omega-3 acid ethyl esters (LOVAZA) 1 g capsule Take 1 g by mouth daily.   ondansetron (ZOFRAN) 4 MG tablet Take 1 tablet (4 mg total) by mouth every 8 (eight) hours as needed for nausea or vomiting.   pantoprazole (PROTONIX) 40 MG tablet Take 40 mg by mouth daily as needed  (Heartburn).   Pitavastatin Calcium (LIVALO) 1 MG TABS Take 2 mg by mouth at bedtime.   sodium chloride (OCEAN) 0.65 % SOLN nasal spray Place 1 spray into both nostrils as needed for congestion.   TRULICITY 1.61 WR/6.0AV SOPN Inject 0.75 mg into the skin once a week.   venlafaxine (EFFEXOR) 37.5 MG tablet Take 37.5 mg by mouth 2 (two) times daily.     Allergies:   Vasotec, Atorvastatin, Codeine, Cymbalta [duloxetine hcl], Lansoprazole, Morphine and related, Sulfate, Amitriptyline hcl, Escitalopram oxalate, Hydrocodone, Iron, Sertraline hcl, Tramadol, Adhesive [tape], Allevyn adhesive [wound dressings], and  Scopolamine   Social History   Socioeconomic History   Marital status: Married    Spouse name: Johnny   Number of children: 2   Years of education: 16   Highest education level: Not on file  Occupational History    Comment: retired Pharmacist, hospital, IT sales professional  Tobacco Use   Smoking status: Never   Smokeless tobacco: Never  Vaping Use   Vaping Use: Never used  Substance and Sexual Activity   Alcohol use: No   Drug use: No   Sexual activity: Not Currently    Birth control/protection: Surgical  Other Topics Concern   Not on file  Social History Narrative   Lives with husband   Caffeine use- none   Social Determinants of Health   Financial Resource Strain: Not on file  Food Insecurity: Not on file  Transportation Needs: Not on file  Physical Activity: Not on file  Stress: Not on file  Social Connections: Not on file     Family History: The patient's family history includes Heart attack in her father; Lung cancer in her mother. There is no history of Colon cancer, Stroke, or Migraines.  ROS:   Please see the history of present illness. (+) Fatigue/Malaise (+) Bilateral LE edema (+) Palpitations (+) Shortness of breath All other systems are reviewed and negative.    EKGs/Labs/Other Studies Reviewed:    The following studies were reviewed today:  TEE 11/18/2020:   1. Left ventricular ejection fraction, by estimation, is 55 to 60%. The  left ventricle has normal function.   2. Right ventricular systolic function is normal. The right ventricular  size is normal. There is mildly elevated pulmonary artery systolic  pressure.   3. A small pericardial effusion is present.   4. Right atrial size was moderately dilated.   5. The mitral valve is normal in structure. Mild mitral valve  regurgitation.   6. The aortic valve is tricuspid. Aortic valve regurgitation is not  visualized.   7. Tricuspid valve regurgitation is moderate.   8. S/p Watchman LAA occlusion device, appears well sealed. No thrombus  seen. Left atrial size was moderately dilated.  Monitor 10/28/2020: HR 46-129bpm, average 68bpm. AF 100% of monitoring period. Rare ventricular ectopy.  Echo 07/13/2020:  1. Left ventricular ejection fraction, by estimation, is 55 to 60%. The  left ventricle has normal function. The left ventricle has no regional  wall motion abnormalities. There is mild asymmetric left ventricular  hypertrophy of the posterior-lateral segment. Left ventricular diastolic parameters are indeterminate. Elevated left ventricular end-diastolic pressure.   2. Right ventricular systolic function is mildly reduced. The right  ventricular size is mildly enlarged. There is normal pulmonary artery  systolic pressure. The estimated right ventricular systolic pressure is  84.1 mmHg.   3. Left atrial size was moderately dilated.   4. Right atrial size was severely dilated.   5. The mitral valve is abnormal. Trivial mitral valve regurgitation.  Moderate mitral annular calcification.   6. Tricuspid valve regurgitation is mild to moderate.   7. The aortic valve is grossly normal. Aortic valve regurgitation is not  visualized. No aortic stenosis is present.   8. The inferior vena cava is normal in size with greater than 50%  respiratory variability, suggesting right atrial pressure of 3  mmHg.   9. S/p 27 mm Watchman FLX 07/01/20, which is not well visualized or  assessed on this transthoracic echo.  Left Atrial Appendage Occlusion 07/01/2020: CONCLUSIONS:  1.  Successful implantation of a  WATCHMAN left atrial appendage occlusive device (27 mm) 2.  TEE demonstrating no LAA thrombus 3.  Small pericardial effusion at the beginning of the case.  Slight increase in fluid in the transverse sinus by the end of the procedure, stable throughout the entire procedure.  Cardiac CTA 04/23/2020: FINDINGS: A 120 kV prospective scan was triggered in the descending thoracic aorta at 111 HU's. Gantry rotation speed was 280 msecs and collimation was .9 mm. No beta blockade and no NTG was given. The 3D data set was reconstructed in 5% intervals of the 60-80 % of the R-R cycle. Diastolic phases were analyzed on a dedicated work station using MPR, MIP and VRT modes. The patient received 80 cc of contrast.   There is normal pulmonary vein drainage into the left atrium (2 on the right and 2 on the left) with ostial measurements as follows:   RUPV: 22.9 x 15.7 mm   RLPV: 16.9 x 13.8 mm   LUPV: 18.1 x 12.5 mm   LLPV: 15.9 x 11.8 mm   The left atrial appendage is very large with mixed chicken wing / broccoli morphology, with 1 major lobe, ostial size 22 x 21 mm and length 50 mm. There is no thrombus in the left atrial appendage.   The esophagus runs to the left from the left atrial midline and is in the proximity to the ostia of the LUPV and LLPV.   Aorta: Normal caliber. No dissection. Mild diffuse calcifications.   Aortic Valve: Trileaflet. Minimal calcifications.   Coronary Arteries: Normal coronary origin. Right dominance. The study was performed without use of NTG and insufficient for plaque evaluation.   Calcium score is 65 that represents 46 percentile for age/sex.   IMPRESSION: 1. There is normal pulmonary vein drainage into the left atrium.   2. The left atrial  appendage is very large with mixed chicken wing / broccoli morphology, with 1 major lobe, ostial size 22 x 21 mm and length 50 mm. There is no thrombus in the left atrial appendage.   3. The esophagus runs to the left from the left atrial midline and is in the proximity to the ostia of the LUPV and LLPV.   EKG:  EKG is personally reviewed and interpreted. 02/21/2022:  EKG was not ordered. 11/23/2020: coarse atrial fibrillation/flutter 73 bpm.   Recent Labs: 02/07/2022: Hemoglobin 14.7; NT-Pro BNP 643; Platelets 205 03/01/2022: BUN 26; Creatinine, Ser 1.05; Potassium 4.4; Sodium 139   Recent Lipid Panel No results found for: "CHOL", "TRIG", "HDL", "CHOLHDL", "VLDL", "LDLCALC", "LDLDIRECT"   Risk Assessment/Calculations:      Physical Exam:    VS:  BP 130/70 (BP Location: Left Arm, Patient Position: Sitting, Cuff Size: Normal)   Pulse 74   Ht '5\' 4"'$  (1.626 m)   Wt 139 lb (63 kg)   SpO2 95%   BMI 23.86 kg/m     Wt Readings from Last 3 Encounters:  02/21/22 139 lb (63 kg)  02/07/22 138 lb 12.8 oz (63 kg)  10/24/21 137 lb 3.2 oz (62.2 kg)     GEN: Well nourished, well developed in no acute distress HEENT: Normal NECK: No JVD; No carotid bruits LYMPHATICS: No lymphadenopathy CARDIAC: Regular rate and rhythm, no murmurs, rubs, gallops RESPIRATORY:  Clear to auscultation without rales, wheezing or rhonchi  ABDOMEN: Soft, non-tender, non-distended MUSCULOSKELETAL:  No edema; No deformity  SKIN: Warm and dry NEUROLOGIC:  Alert and oriented x 3 PSYCHIATRIC:  Normal affect    ASSESSMENT:  1. Paroxysmal atrial fibrillation ([place])   2. Acute on chronic heart failure with preserved ejection fraction ([place])   3. Fatigue, unspecified type      PLAN:    In order of problems listed above:  Paroxysmal atrial fibrillation (HCC) Currently in sinus rhythm.  Excellent.  Maintaining.  Watchman in place.  Previous bleeding on Eliquis.  Dr. Quentin Ore.  No changes made in medication  management.  Medications reviewed.  (HFpEF) heart failure with preserved ejection fraction (HCC) Mild lower extremity edema noted.  She may take an additional Lasix periodically.  Fatigue Main complaint is fatigue.  I reviewed her last hemoglobin which is in the 14 range.  Excellent.  Last TSH was normal.  Continue to mobilize, exercise as best as possible.  I know this is frustrating for her.  She is in sinus rhythm currently.   She will have follow-up with me in 6 months.  Medication Adjustments/Labs and Tests Ordered: Current medicines are reviewed at length with the patient today.  Concerns regarding medicines are outlined above.   No orders of the defined types were placed in this encounter.  No orders of the defined types were placed in this encounter.  Patient Instructions  Medication Instructions:  The current medical regimen is effective;  continue present plan and medications.  *If you need a refill on your cardiac medications before your next appointment, please call your pharmacy*  Follow-Up: At [facility], you and your health needs are our priority.  As part of our continuing mission to provide you with exceptional heart care, we have created designated Provider Care Teams.  These Care Teams include your primary Cardiologist (physician) and Advanced Practice Providers (APPs -  Physician Assistants and Nurse Practitioners) who all work together to provide you with the care you need, when you need it.  We recommend signing up for the patient portal called "MyChart".  Sign up information is provided on this After Visit Summary.  MyChart is used to connect with patients for Virtual Visits (Telemedicine).  Patients are able to view lab/test results, encounter notes, upcoming appointments, etc.  Non-urgent messages can be sent to your provider as well.   To learn more about what you can do with MyChart, go to [email or web address].    Your next appointment:   6  month(s)  The format for your next appointment:   In Person  Provider:   Candee Furbish, MD {   Important Information About Sugar         I,Mathew Stumpf,acting as a scribe for Candee Furbish, MD.,have documented all relevant documentation on the behalf of Candee Furbish, MD,as directed by  Candee Furbish, MD while in the presence of Candee Furbish, MD.  I, Candee Furbish, MD, have reviewed all documentation for this visit. The documentation on 03/02/22 for the exam, diagnosis, procedures, and orders are all accurate and complete.   Signed, Candee Furbish, MD  03/02/2022 1:10 PM    [facility]

## 2022-03-01 ENCOUNTER — Other Ambulatory Visit: Payer: Medicare PPO | Admitting: *Deleted

## 2022-03-01 DIAGNOSIS — Z79899 Other long term (current) drug therapy: Secondary | ICD-10-CM

## 2022-03-02 ENCOUNTER — Encounter: Payer: Self-pay | Admitting: Cardiology

## 2022-03-02 DIAGNOSIS — N1831 Chronic kidney disease, stage 3a: Secondary | ICD-10-CM

## 2022-03-02 LAB — BASIC METABOLIC PANEL
BUN/Creatinine Ratio: 25 (ref 12–28)
BUN: 26 mg/dL (ref 8–27)
CO2: 29 mmol/L (ref 20–29)
Calcium: 9.6 mg/dL (ref 8.7–10.3)
Chloride: 97 mmol/L (ref 96–106)
Creatinine, Ser: 1.05 mg/dL — ABNORMAL HIGH (ref 0.57–1.00)
Glucose: 134 mg/dL — ABNORMAL HIGH (ref 70–99)
Potassium: 4.4 mmol/L (ref 3.5–5.2)
Sodium: 139 mmol/L (ref 134–144)
eGFR: 54 mL/min/{1.73_m2} — ABNORMAL LOW (ref >59)

## 2022-03-02 NOTE — Telephone Encounter (Signed)
Please see the MyChart message reply(ies) for my assessment and plan.    This patient gave consent for this Medical Advice Message and is aware that it may result in a bill to their insurance company, as well as the possibility of receiving a bill for a co-payment or deductible. They are an established patient, but are not seeking medical advice exclusively about a problem treated during an in person or video visit in the last seven days. I did not recommend an in person or video visit within seven days of my reply.    I spent a total of 6 minutes cumulative time within 7 days through MyChart messaging.  Kanai Hilger, MD   

## 2022-03-23 ENCOUNTER — Ambulatory Visit (INDEPENDENT_AMBULATORY_CARE_PROVIDER_SITE_OTHER): Payer: Medicare PPO | Admitting: Podiatry

## 2022-03-23 ENCOUNTER — Encounter: Payer: Self-pay | Admitting: Podiatry

## 2022-03-23 DIAGNOSIS — M2011 Hallux valgus (acquired), right foot: Secondary | ICD-10-CM | POA: Diagnosis not present

## 2022-03-23 DIAGNOSIS — B351 Tinea unguium: Secondary | ICD-10-CM | POA: Diagnosis not present

## 2022-03-23 DIAGNOSIS — M2012 Hallux valgus (acquired), left foot: Secondary | ICD-10-CM | POA: Diagnosis not present

## 2022-03-23 DIAGNOSIS — M79675 Pain in left toe(s): Secondary | ICD-10-CM

## 2022-03-23 DIAGNOSIS — M79674 Pain in right toe(s): Secondary | ICD-10-CM | POA: Diagnosis not present

## 2022-03-23 DIAGNOSIS — L84 Corns and callosities: Secondary | ICD-10-CM | POA: Diagnosis not present

## 2022-03-23 DIAGNOSIS — E1142 Type 2 diabetes mellitus with diabetic polyneuropathy: Secondary | ICD-10-CM

## 2022-03-28 NOTE — Progress Notes (Signed)
ANNUAL DIABETIC FOOT EXAM  Subjective: [name] presents today for annual diabetic foot examination.  Patient confirms h/o diabetes.  Patient relates 40 year h/o diabetes.  Patient denies any h/o foot wounds.  Patient has been diagnosed with neuropathy.  Patient's blood sugar was 121 mg/dl today. Last known  HgA1c was 5.7%   Risk factors: diabetes, diabetic neuropathy, HTN, CAD, CKD, hypercholesterolemia.  Nancy Frames, MD is patient's PCP. Last visit was June, 2023.  Past Medical History:  Diagnosis Date   Acute GI bleeding 05/13/2015   Anxiety    Arthritis    Asthma    Atrial fibrillation ([place]) 10/29/2017   Bakers cyst, left    Chronic kidney disease    stage 3   Constipation    Diabetes mellitus    type 2   Dysrhythmia    GERD (gastroesophageal reflux disease)    Heart murmur    History of cellulitis    History of dizziness    History of hiatal hernia    tiny 06/15/2016   Hypertension    Iron deficiency anemia    Peripheral vascular disease (HCC)    PONV (postoperative nausea and vomiting)    Pulmonary nodule    Patient Active Problem List   Diagnosis Date Noted   Fatigue 02/21/2022   Hearing difficulty 09/22/2021   Pain in right hand 06/15/2021   Arthritis of hand 04/14/2021   Physical deconditioning 12/06/2020   Hypertensive crisis 11/19/2020   Abnormality of left atrial appendage 09/06/2020   Anxiety 09/06/2020   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) 09/06/2020   CAD (coronary artery disease) 09/06/2020   Chronic kidney disease, stage 3 unspecified ([place]) 09/06/2020   (HFpEF) heart failure with preserved ejection fraction ([place]) 09/06/2020   Diabetic retinopathy associated with type 2 diabetes mellitus ([place]) 09/06/2020   Gastroesophageal reflux disease with esophagitis 09/06/2020   Hypomagnesemia 09/06/2020   Infectious colitis, enteritis and gastroenteritis 09/06/2020   Insomnia 09/06/2020   Long term  (current) use of insulin ([place]) 09/06/2020   Other specified disorders of bone density and structure, other site 09/06/2020   Peripheral venous insufficiency 09/06/2020   Personal history of colonic polyps 09/06/2020   Personal history of other diseases of the digestive system 09/06/2020   Presence of Watchman left atrial appendage closure device 09/06/2020   Pulmonary nodule 09/06/2020   Recurrent falls 09/06/2020   Recurrent major depression ([place]) 09/06/2020   Slow transit constipation 09/06/2020   Upper abdominal pain 09/06/2020   Secondary hypercoagulable state ([place]) 08/05/2020   Acute on chronic respiratory failure with hypoxemia ([employer]) 07/22/2020   Pleural effusion 07/22/2020   SOB (shortness of breath) 07/12/2020   ABLA (acute blood loss anemia) 07/12/2020   Paroxysmal atrial fibrillation ([place]) 04/14/2020   Thoracic back pain 12/16/2019   Muscle weakness 05/01/2018   Impairment of balance 04/25/2018   S/P left TKA 11/05/2017   S/P total knee replacement 11/05/2017   Osteoarthritis 09/06/2017   Pain in left knee 09/06/2017   Diabetes mellitus ([place]) 12/25/2016   Hypercholesterolemia 12/25/2016   Ptosis of eyelid 10/09/2016   Iron deficiency anemia 12/15/2015   Acute GI bleeding 05/13/2015   Acute blood loss anemia 05/13/2015   Normocytic anemia 05/13/2015   Allergic rhinitis 04/06/2015   Asthma, chronic 09/08/2013   Hypoxia 09/08/2013   HTN (hypertension) 09/08/2013   Depression 09/08/2013   DM type 2 with diabetic peripheral neuropathy ([place]) 09/08/2013   Polyneuropathy due to type 2 diabetes mellitus ([place]) 09/08/2013  Flu-like symptoms 09/07/2013   Past Surgical History:  Procedure Laterality Date   APPENDECTOMY  1952   BIOPSY  10/16/2018   Procedure: BIOPSY;  Surgeon: Clarene Essex, MD;  Location: [facility];  Service: Endoscopy;;   BIOPSY  11/26/2019   Procedure: BIOPSY;  Surgeon: Clarene Essex, MD;  Location: [facility];  Service: Endoscopy;;   BIOPSY   07/14/2020   Procedure: BIOPSY;  Surgeon: Ronald Lobo, MD;  Location: [facility];  Service: Endoscopy;;   CARDIOVERSION N/A 09/09/2020   Procedure: CARDIOVERSION;  Surgeon: Josue Hector, MD;  Location: [facility] ENDOSCOPY;  Service: Cardiovascular;  Laterality: N/A;   CARDIOVERSION N/A 11/18/2020   Procedure: CARDIOVERSION;  Surgeon: Donato Heinz, MD;  Location: [facility] ENDOSCOPY;  Service: Cardiovascular;  Laterality: N/A;   COLONOSCOPY W/ POLYPECTOMY  05/15/2013   ESOPHAGOGASTRODUODENOSCOPY (EGD) WITH PROPOFOL N/A 06/15/2016   Procedure: ESOPHAGOGASTRODUODENOSCOPY (EGD) WITH PROPOFOL;  Surgeon: Clarene Essex, MD;  [facility];  Service: Endoscopy;  Laterality: N/A;   ESOPHAGOGASTRODUODENOSCOPY (EGD) WITH PROPOFOL N/A 10/16/2018   Procedure: ESOPHAGOGASTRODUODENOSCOPY (EGD) WITH PROPOFOL;  Surgeon: Clarene Essex, MD;  [facility];  Service: Endoscopy;  Laterality: N/A;   ESOPHAGOGASTRODUODENOSCOPY (EGD) WITH PROPOFOL N/A 11/26/2019   Procedure: ESOPHAGOGASTRODUODENOSCOPY (EGD) WITH PROPOFOL;  Surgeon: Clarene Essex, MD;  [facility];  Service: Endoscopy;  Laterality: N/A;   ESOPHAGOGASTRODUODENOSCOPY (EGD) WITH PROPOFOL N/A 07/14/2020   Procedure: ESOPHAGOGASTRODUODENOSCOPY (EGD) WITH PROPOFOL;  Surgeon: Ronald Lobo, MD;  [facility];  Service: Endoscopy;  Laterality: N/A;   EYE SURGERY Bilateral    cataracts removed   GI RADIOFREQUENCY ABLATION  10/16/2018   Procedure: GI RADIOFREQUENCY ABLATION;  Surgeon: Clarene Essex, MD;  Location: [facility];  Service: Endoscopy;;   GI RADIOFREQUENCY ABLATION N/A 11/26/2019   Procedure: GI RADIOFREQUENCY ABLATION;  Surgeon: Clarene Essex, MD;  [facility]: [facility];  Service: Endoscopy;  Laterality: N/A;   HAND SURGERY Left 2010   operated on index and ring finger   HOT HEMOSTASIS N/A 07/14/2020   Procedure: HOT HEMOSTASIS (ARGON PLASMA COAGULATION/BICAP);  Surgeon: Ronald Lobo, MD;  Location: [facility]  END[facility];  Service: Endoscopy;  Laterality: N/A;   INCONTINENCE SURGERY     JOINT REPLACEMENT Right    knee   KNEE ARTHROSCOPY  2003 2005   right knee   KYPHOPLASTY N/A 01/29/2020   Procedure: T8 KYPHOPLASTY;  Surgeon: Melina Schools, MD;  Location: [place];  Service: Orthopedics;  Laterality: N/A;  60 mins Local with [facility]   LEFT ATRIAL APPENDAGE OCCLUSION N/A 07/01/2020   Procedure: LEFT ATRIAL APPENDAGE OCCLUSION;  Surgeon: Vickie Epley, MD;  Location: [place] CV LAB;  Service: Cardiovascular;  Laterality: N/A;   mass fallopian tube     SPINAL CORD STIMULATOR BATTERY EXCHANGE N/A 09/09/2015   Procedure: SPINAL CORD STIMULATOR BATTERY EXCHANGE;  Surgeon: Melina Schools, MD;  Location: [place];  Service: Orthopedics;  Laterality: N/A;   SPINAL CORD STIMULATOR BATTERY EXCHANGE N/A 09/08/2021   Procedure: SPINAL CORD STIMULATOR BATTERY EXCHANGE;  Surgeon: Melina Schools, MD;  Location: [place];  Service: Orthopedics;  Laterality: N/A;   TEE WITHOUT CARDIOVERSION N/A 08/18/2020   Procedure: TRANSESOPHAGEAL ECHOCARDIOGRAM (TEE);  Surgeon: Josue Hector, MD;  Location: [facility] ENDOSCOPY;  Service: Cardiovascular;  Laterality: N/A;   TEE WITHOUT CARDIOVERSION N/A 09/09/2020   Procedure: TRANSESOPHAGEAL ECHOCARDIOGRAM (TEE);  Surgeon: Josue Hector, MD;  Location: [facility] ENDOSCOPY;  Service: Cardiovascular;  Laterality: N/A;   TEE WITHOUT CARDIOVERSION N/A 11/18/2020   Procedure: TRANSESOPHAGEAL ECHOCARDIOGRAM (TEE);  Surgeon:  Donato Heinz, MD;  Location: [place];  Service: Cardiovascular;  Laterality: N/A;   TOTAL KNEE ARTHROPLASTY Left 11/05/2017   Procedure: LEFT TOTAL KNEE ARTHROPLASTY;  Surgeon: Paralee Cancel, MD;  Location: [facility];  Service: Orthopedics;  Laterality: Left;  Adductor Block   TUBAL LIGATION     VAGINAL HYSTERECTOMY  1977   1 ovary removed   Current Outpatient Medications on File Prior to Visit  Medication Sig Dispense Refill   albuterol (PROAIR HFA)  108 (90 BASE) MCG/ACT inhaler Inhale 2 puffs into the lungs every 4 (four) hours as needed for wheezing or shortness of breath. 1 Inhaler 3   ALPRAZolam (XANAX) 0.25 MG tablet      amitriptyline (ELAVIL) 50 MG tablet      amLODipine (NORVASC) 10 MG tablet      Aspirin 81 MG CAPS      aspirin EC 81 MG tablet Take 1 tablet (81 mg total) by mouth daily. Swallow whole. 90 tablet 3   busPIRone (BUSPAR) 5 MG tablet      calcitonin, salmon, (MIACALCIN/FORTICAL) 200 UNIT/ACT nasal spray SPRAY 1 SPRAY NASALLY EVERY DAY - ALTERNATE NOSTRIL DAILY     Continuous Blood Gluc Sensor (FREESTYLE LIBRE 14 DAY SENSOR) MISC Inject 1 patch into the skin every 14 (fourteen) days.     cyclobenzaprine (FLEXERIL) 5 MG tablet TAKE 1 TABLET TWICE A DAY BY ORAL ROUTE AS NEEDED FOR 15 DAYS.     diazepam (VALIUM) 10 MG tablet      diphenhydrAMINE (BENADRYL) 25 MG tablet Take 25 mg by mouth daily as needed for allergies.     diphenoxylate-atropine (LOMOTIL) 2.5-0.025 MG tablet      docusate sodium (COLACE) 100 MG capsule Take 100 mg by mouth daily as needed for moderate constipation.     doxycycline (VIBRA-TABS) 100 MG tablet TAKE 1 TABLET BY MOUTH TWICE A DAY FOR 7 DAYS     DROPLET PEN NEEDLES 32G X 4 MM MISC      esomeprazole (NEXIUM) 20 MG capsule Take 1 capsule every day by oral route.     famotidine (PEPCID) 20 MG tablet      fluticasone (FLONASE) 50 MCG/ACT nasal spray Place 1 spray into both nostrils daily as needed for allergies.      furosemide (LASIX) 40 MG tablet TAKE 1 TABLET BY MOUTH EVERY DAY 90 tablet 1   gabapentin (NEURONTIN) 300 MG capsule Take 300-600 mg by mouth See admin instructions. Take 300 mg in the morning and 600 mg at bedtime     HYDROcodone-acetaminophen (NORCO/VICODIN) 5-325 MG tablet      insulin glargine (LANTUS) 100 UNIT/ML Solostar Pen Inject 22 Units into the skin at bedtime.     insulin lispro (HUMALOG KWIKPEN) 100 UNIT/ML KwikPen      ipratropium (ATROVENT) 0.06 % nasal spray Place 1  spray into both nostrils daily.     JARDIANCE 10 MG TABS tablet Take by mouth.     KLOR-CON M20 20 MEQ tablet TAKE 2 TABLETS BY MOUTH DAILY 180 tablet 3   LORazepam (ATIVAN) 1 MG tablet      losartan (COZAAR) 100 MG tablet      magnesium oxide (MAG-OX) 400 (240 Mg) MG tablet TAKE 1/2 A TABLET (200 MG TOTAL) BY MOUTH DAILY. 45 tablet 1   metoCLOPramide (REGLAN) 10 MG tablet      metoprolol tartrate (LOPRESSOR) 100 MG tablet Take 1 tablet (100 mg total) by mouth 2 (two) times daily. 60 tablet 0  Metoprolol-Hydrochlorothiazide 100-12.5 MG TB24 Take 1 tablet every day by oral route.     molnupiravir EUA (LAGEVRIO) 200 MG CAPS capsule TAKE 4 CAPSULES BY MOUTH EVERY 12 HOURS FOR 5 DAYS     Multiple Vitamins-Minerals (PRESERVISION AREDS 2 PO) Take 1 capsule by mouth 2 (two) times daily.      NOVOLOG FLEXPEN 100 UNIT/ML FlexPen Inject 10-14 Units into the skin See admin instructions. Inject 10-14 units into the skin, PER SLIDING SCALE, in the morning, noon, and bedtime     omega-3 acid ethyl esters (LOVAZA) 1 g capsule Take 1 g by mouth daily.     ondansetron (ZOFRAN) 4 MG tablet Take 1 tablet (4 mg total) by mouth every 8 (eight) hours as needed for nausea or vomiting. 20 tablet 0   ondansetron (ZOFRAN-ODT) 4 MG disintegrating tablet      pantoprazole (PROTONIX) 40 MG tablet Take 40 mg by mouth daily as needed (Heartburn).     Pitavastatin Calcium (LIVALO) 1 MG TABS Take 2 mg by mouth at bedtime.     sertraline (ZOLOFT) 50 MG tablet      sodium chloride (OCEAN) 0.65 % SOLN nasal spray Place 1 spray into both nostrils as needed for congestion.     tiZANidine (ZANAFLEX) 4 MG tablet      Triamterene (DYRENIUM PO)      TRULICITY 4.08 XK/4.8JE SOPN Inject 0.75 mg into the skin once a week.     venlafaxine (EFFEXOR) 37.5 MG tablet Take 37.5 mg by mouth 2 (two) times daily.     No current facility-administered medications on file prior to visit.    Allergies  Allergen Reactions   Vasotec Shortness Of  Breath and Other (See Comments)    Wheezing, also   Atorvastatin Other (See Comments)    Leg cramps    Codeine Nausea And Vomiting   Cymbalta [Duloxetine Hcl] Itching   Lansoprazole Itching, Swelling, Rash and Other (See Comments)    Redness, swelling of mouth. During 07/12/20-07/15/20 pt tolerated IV protonix infusion with no reactions.   Morphine And Related Itching   Sulfate Itching   Amitriptyline Hcl Other (See Comments)     sleepwalking   Escitalopram Oxalate Itching   Hydrocodone Itching   Iron Other (See Comments)    constipated   Sertraline Hcl Itching   Tramadol Itching   Adhesive [Tape] Rash   Allevyn Adhesive [Wound Dressings] Rash   Scopolamine Rash   Social History   Occupational History    Comment: retired Pharmacist, hospital, IT sales professional  Tobacco Use   Smoking status: Never   Smokeless tobacco: Never  Vaping Use   Vaping Use: Never used  Substance and Sexual Activity   Alcohol use: No   Drug use: No   Sexual activity: Not Currently    Birth control/protection: Surgical   Family History  Problem Relation Age of Onset   Lung cancer Mother    Heart attack Father    Colon cancer Neg Hx    Stroke Neg Hx    Migraines Neg Hx    Immunization History  Administered Date(s) Administered   Influenza Split 06/01/2009, 05/24/2012, 05/29/2013, 05/28/2014   Influenza, High Dose Seasonal PF 05/25/2014, 05/14/2018, 05/27/2020   Influenza,inj,Quad PF,6+ Mos 05/23/2011, 06/08/2017   Influenza,inj,quad, With Preservative 05/27/2015, 04/21/2016   PFIZER(Purple Top)SARS-COV-2 Vaccination 10/04/2019, 10/24/2019   Pneumococcal Conjugate-13 08/18/2014   Pneumococcal Polysaccharide-23 08/29/2011, 08/28/2012   Tdap 08/18/2013   Zoster Recombinat (Shingrix) 05/14/2018   Zoster, Live 10/27/2014, 11/17/2014  Review of Systems: Negative except as noted in the HPI.   Objective: There were no vitals filed for this visit.  [name] is a pleasant 81 y.o. female in NAD.  AAO X 3.  Vascular Examination: CFT <4 seconds b/l. DP/PT pulses faintly palpable b/l. Skin temperature gradient warm to warm b/l. No ischemia or gangrene. No cyanosis or clubbing noted b/l.    Neurological Examination: Pt has subjective symptoms of neuropathy. Protective sensation diminished with 10g monofilament b/l. Vibratory sensation intact b/l.  Dermatological Examination: Pedal skin thin, shiny and atrophic b/l. Toenails 1-5 b/l thick, discolored, elongated with subungual debris and pain on dorsal palpation.  Hyperkeratotic lesion(s) 1st metatarsal head b/l lower extremities.  No erythema, no edema, no drainage, no fluctuance.  Musculoskeletal Examination: Muscle strength 5/5 to all lower extremity muscle groups bilaterally. No pain, crepitus or joint limitation noted with ROM bilateral LE. HAV with bunion deformity noted b/l LE.  Radiographs: None Footwear Assessment: Does the patient wear appropriate shoes? Yes. Does the patient need inserts/orthotics? Yes . ADA Risk Categorization:  High Risk  Patient has one or more of the following: Loss of protective sensation Absent pedal pulses Severe Foot deformity History of foot ulcer  Assessment: 1. Pain due to onychomycosis of toenails of both feet   2. Callus   3. Hallux valgus, acquired, bilateral   4. Diabetic peripheral neuropathy associated with type 2 diabetes mellitus ([place])     Plan: -Examined patient. -Diabetic foot examination performed today. -Continue foot and shoe inspections daily. Monitor blood glucose per PCP/Endocrinologist's recommendations. -Toenails 1-5 b/l were debrided in length and girth with sterile nail nippers and dremel without iatrogenic bleeding.  -Callus(es) 1st metatarsal head b/l lower extremities pared utilizing sterile scalpel blade without complication or incident. Total number debrided =2. -Patient/POA to call should there be question/concern in the interim. Return in about 3 months  (around 06/23/2022).  Marzetta Board, DPM

## 2022-04-29 ENCOUNTER — Other Ambulatory Visit: Payer: Self-pay | Admitting: Cardiology

## 2022-05-04 ENCOUNTER — Ambulatory Visit: Payer: Self-pay | Admitting: Licensed Clinical Social Worker

## 2022-05-04 NOTE — Patient Instructions (Signed)
  It was a pleasure speaking with you today. per your request your phone appointment is scheduled 05/11/2022  Casimer Lanius, [facility] [phone]

## 2022-05-04 NOTE — Patient Outreach (Signed)
  Care Coordination   Initial Visit Note   05/04/2022 Name: GOLDEN EMILE MRN: [id number] DOB: Jul 02, 1941  JEANNINE PENNISI is a 81 y.o. year old female who sees Kathalene Frames, MD for primary care. I spoke with  Andrey Cota by phone today.  What matters to the patients health and wellness today?  Phone appointment scheduled    Goals Addressed             This Visit's Progress    Care Coordination Activies       Care Coordination Interventions: Provided education to patient re: Care Coordination Services          SDOH assessments and interventions completed:  No   Care Coordination Interventions Activated:  Yes  Care Coordination Interventions:  Yes, provided   Follow up plan: Follow up call scheduled for 05/11/22    Encounter Outcome:  Pt. Visit Completed   Casimer Lanius, [place] [phone]

## 2022-05-11 ENCOUNTER — Encounter: Payer: Self-pay | Admitting: Licensed Clinical Social Worker

## 2022-05-11 ENCOUNTER — Ambulatory Visit: Payer: Self-pay | Admitting: Licensed Clinical Social Worker

## 2022-05-11 NOTE — Patient Instructions (Signed)
Visit Information  Thank you for taking time to visit with me today. Please don't hesitate to contact me if I can be of assistance to you.   Following are the goals we discussed today:   Goals Addressed             This Visit's Progress    COMPLETED: Care Coordination Activies No Follow up Required       Care Coordination Interventions: Reviewed Care Coordination Services: explained [place] Coordination program, . Discussed benefits of Medicare Annual Wellness Visit: per patient completed by [facility]  Concerns with obtaining required medications: none Assessed Social Determinants of Health Advised patient to call PCP about recent pain in hip             Please call the care guide team at [phone] if you would like to schedule a phone appointment with a member of the care coordination team.   Patient verbalizes understanding of instructions and care plan provided today and agrees to view in [place]. Active MyChart status and patient understanding of how to access instructions and care plan via MyChart confirmed with patient.     No further follow up required: by Care Coordination team at this time  Casimer Lanius, [place] [phone]

## 2022-05-11 NOTE — Patient Outreach (Signed)
  Care Coordination  Initial Visit Note   05/11/2022 Name: [name] MRN: [id number] DOB: Jan 26, 1941  [name] is a 81 y.o. year old female who sees Kathalene Frames, MD for primary care. I spoke with  Andrey Cota by phone today.  What matters to the patients health and wellness today?  Recent pain in her hip, Patient reports no concerns or needs from Care Coordination team with health and wellness related to physical or mental heath.  Reviewed all upcoming appointments.   Recommendation: Patient may benefit from, and is in agreement to To contact PCP to schedule an office visit.    Goals Addressed             This Visit's Progress    COMPLETED: Care Coordination Activies No Follow up Required       Care Coordination Interventions: Reviewed Care Coordination Services: explained [place] Coordination program, . Discussed benefits of Medicare Annual Wellness Visit: per patient completed by [facility]  Concerns with obtaining required medications: none Assessed Social Determinants of Health Advised patient to call PCP about recent pain in hip             SDOH assessments and interventions completed:  Yes  SDOH Interventions Today    Flowsheet Row Most Recent Value  SDOH Interventions   Food Insecurity Interventions Intervention Not Indicated  Housing Interventions Intervention Not Indicated  Transportation Interventions Intervention Not Indicated  Utilities Interventions Intervention Not Indicated  Financial Strain Interventions Intervention Not Indicated  Stress Interventions Intervention Not Indicated  Social Connections Interventions Intervention Not Indicated       Care Coordination Interventions Activated:  Yes  Care Coordination Interventions:  Yes, provided   Follow up plan: No further intervention required.   Encounter Outcome:  Pt. Visit Completed   Casimer Lanius, [place] [phone]

## 2022-06-05 ENCOUNTER — Telehealth: Payer: Self-pay

## 2022-06-05 NOTE — Telephone Encounter (Signed)
Called to check in with patient, who had [name] on 07/01/2020.  Left message to call back.

## 2022-06-08 NOTE — Telephone Encounter (Signed)
Left message to call back  

## 2022-06-28 ENCOUNTER — Ambulatory Visit: Payer: Medicare PPO | Attending: Cardiology | Admitting: Cardiology

## 2022-06-28 ENCOUNTER — Encounter: Payer: Self-pay | Admitting: Cardiology

## 2022-06-28 VITALS — BP 120/70 | HR 69 | Ht 64.0 in | Wt 134.0 lb

## 2022-06-28 DIAGNOSIS — I480 Paroxysmal atrial fibrillation: Secondary | ICD-10-CM

## 2022-06-28 MED ORDER — IPRATROPIUM BROMIDE 0.06 % NA SOLN: 1.0000 | Freq: Every day | NASAL | 1 refills | Status: DC

## 2022-06-28 NOTE — Telephone Encounter (Signed)
The patient was seen in the office today by her primary cardiologist, Dr. Marlou Porch. Per Dr. Marlou Porch' note,  "Watchman device Doing very well no more GI bleeding.  Excellent.  Takes low-dose aspirin.   Paroxysmal atrial fibrillation (HCC) Currently in sinus rhythm.  Excellent.  Maintaining.  Watchman in place.  Previous bleeding on Eliquis.  Dr. Quentin Ore.  No changes made in medication management.  Medications reviewed.  Doing well.  Metoprolol"

## 2022-06-28 NOTE — Patient Instructions (Signed)
Medication Instructions:  The current medical regimen is effective;  continue present plan and medications.  *If you need a refill on your cardiac medications before your next appointment, please call your pharmacy*  Follow-Up: At [facility], you and your health needs are our priority.  As part of our continuing mission to provide you with exceptional heart care, we have created designated Provider Care Teams.  These Care Teams include your primary Cardiologist (physician) and Advanced Practice Providers (APPs -  Physician Assistants and Nurse Practitioners) who all work together to provide you with the care you need, when you need it.  We recommend signing up for the patient portal called "MyChart".  Sign up information is provided on this After Visit Summary.  MyChart is used to connect with patients for Virtual Visits (Telemedicine).  Patients are able to view lab/test results, encounter notes, upcoming appointments, etc.  Non-urgent messages can be sent to your provider as well.   To learn more about what you can do with MyChart, go to [email or web address].    Your next appointment:   1 year(s)  The format for your next appointment:   In Person  Provider:   Mark Skains, MD      Important Information About Sugar       

## 2022-06-28 NOTE — Progress Notes (Signed)
Cardiology Office Note:    Date:  06/28/2022   ID:  [name], DOB 1940/12/14, MRN [id number]  PCP:  Kathalene Frames, MD   [place]  Cardiologist:  Candee Furbish, MD  Advanced Practice Provider:  No care team member to display Electrophysiologist:  Vickie Epley, MD       Referring MD: Kathalene Frames, *    History of Present Illness:    [name] is a 81 y.o. female with PMHx of PAF, CHF, CAD, HTN, HLD, DM, CKD, asthma, and anemia who presents to clinic for the follow-up of CAD.  On 02/07/2022 she was seen by Richardson Dopp, PA-C with concerns of increasing shortness of breath and mild chest discomfort. It was suspected her symptoms were related to decompensated heart failure. Furosemide was increased to 40 mg BID for 3 days, and then decreased to 60 mg daily.  Potassium was increased to 40 mEq BID for three days, then 40 mEq in the morning and 20 mEq in the evening. It was felt her chest discomfort seemed more like indigestion; her EKG showed no acute changes. On 02/15/2022 labs showed increased creatinine and normal potassium. Planned to decrease Lasix to 40 mg once daily, decrease potassium to 40 mEq once daily, and order BMET at follow-up.  She had postconversion acute on chronic diastolic heart failure contributed by withholding diuretic therapy.  And catecholamine surge related to cardioversion.  She also had IV fluids during procedure.  Lasix was administered for pulmonary edema.  She remained in normal rhythm in the ER seen by Dr. Tamala Julian in cardiology team.  Also sees Dr. Quentin Ore with electrophysiology.  She previously had failed cardioversion prior to amiodarone.  27 mm watchman present.  EF 65%.  (11/23/2020): Her EKG demonstrates atrial fibrillation/flutter once again heart rate 73 bpm.  Once again this was with amiodarone fully loaded post cardioversion.  See below for details.  At her last visit with me on 02/21/22, she was  feeling poorly secondary to lack of energy. She was concerned with labile blood pressures as well. She was taking Lasix daily with moderate control of her BLE edema. She reported occasional flip flop palpitations when laying down at night as well as occasional shortness of breath.  Today, she states that she is doing well overall. Her only complaint today is a persistent runny nose after a fall resulting in nose fractures x3 years ago. She has tried Flonase with minimal relief. She previously saw ENT who prescribed Atrovent nasal spray with good control of symptoms.  She states that the Lasix doesn't always provide as much relief of her fluid retention as she would like. Her leg swelling is well controlled at this time.  She is trying to reduce her sodium intake. She has been rinsing off her canned foods.  The patient denies chest pain, chest pressure, dyspnea at rest or with exertion, PND, or orthopnea.  Denies cough, fever, chills, nausea, or vomiting. Denies syncope, presyncope, or snoring. Denies dizziness or lightheadedness.    Past Medical History:  Diagnosis Date   Acute GI bleeding 05/13/2015   Anxiety    Arthritis    Asthma    Atrial fibrillation ([place]) 10/29/2017   Bakers cyst, left    Chronic kidney disease    stage 3   Constipation    Diabetes mellitus    type 2   Dysrhythmia    GERD (gastroesophageal reflux disease)    Heart murmur  History of cellulitis    History of dizziness    History of hiatal hernia    tiny 06/15/2016   Hypertension    Iron deficiency anemia    Peripheral vascular disease (HCC)    PONV (postoperative nausea and vomiting)    Pulmonary nodule     Past Surgical History:  Procedure Laterality Date   APPENDECTOMY  1952   BIOPSY  10/16/2018   Procedure: BIOPSY;  Surgeon: Clarene Essex, MD;  Location: [facility];  Service: Endoscopy;;   BIOPSY  11/26/2019   Procedure: BIOPSY;  Surgeon: Clarene Essex, MD;  Location: [facility];  Service:  Endoscopy;;   BIOPSY  07/14/2020   Procedure: BIOPSY;  Surgeon: Ronald Lobo, MD;  Location: [facility];  Service: Endoscopy;;   CARDIOVERSION N/A 09/09/2020   Procedure: CARDIOVERSION;  Surgeon: Josue Hector, MD;  Location[facility] ENDOSCOPY;  Service: Cardiovascular;  Laterality: N/A;   CARDIOVERSION N/A 11/18/2020   Procedure: CARDIOVERSION;  Surgeon: Donato Heinz, MD;  Location: [facility] ENDOSCOPY;  Service: Cardiovascular;  Laterality: N/A;   COLONOSCOPY W/ POLYPECTOMY  05/15/2013   ESOPHAGOGASTRODUODENOSCOPY (EGD) WITH PROPOFOL N/A 06/15/2016   Procedure: ESOPHAGOGASTRODUODENOSCOPY (EGD) WITH PROPOFOL;  Surgeon: Clarene Essex, MD;  Location: [facility];  Service: Endoscopy;  Laterality: N/A;   ESOPHAGOGASTRODUODENOSCOPY (EGD) WITH PROPOFOL N/A 10/16/2018   Procedure: ESOPHAGOGASTRODUODENOSCOPY (EGD) WITH PROPOFOL;  Surgeon: Clarene Essex, MD;  Location: [facility];  Service: Endoscopy;  Laterality: N/A;   ESOPHAGOGASTRODUODENOSCOPY (EGD) WITH PROPOFOL N/A 11/26/2019   Procedure: ESOPHAGOGASTRODUODENOSCOPY (EGD) WITH PROPOFOL;  Surgeon: Clarene Essex, MD;  Location: [facility];  Service: Endoscopy;  Laterality: N/A;   ESOPHAGOGASTRODUODENOSCOPY (EGD) WITH PROPOFOL N/A 07/14/2020   Procedure: ESOPHAGOGASTRODUODENOSCOPY (EGD) WITH PROPOFOL;  Surgeon: Ronald Lobo, MD;  Location: [facility];  Service: Endoscopy;  Laterality: N/A;   EYE SURGERY Bilateral    cataracts removed   GI RADIOFREQUENCY ABLATION  10/16/2018   Procedure: GI RADIOFREQUENCY ABLATION;  Surgeon: Clarene Essex, MD;  Location: [facility];  Service: Endoscopy;;   GI RADIOFREQUENCY ABLATION N/A 11/26/2019   Procedure: GI RADIOFREQUENCY ABLATION;  Surgeon: Clarene Essex, MD;  Location: [facility];  Service: Endoscopy;  Laterality: N/A;   HAND SURGERY Left 2010   operated on index and ring finger   HOT HEMOSTASIS N/A 07/14/2020   Procedure: HOT HEMOSTASIS (ARGON PLASMA COAGULATION/BICAP);  Surgeon: Ronald Lobo,  MD;  Location: [facility];  Service: Endoscopy;  Laterality: N/A;   INCONTINENCE SURGERY     JOINT REPLACEMENT Right    knee   KNEE ARTHROSCOPY  2003 2005   right knee   KYPHOPLASTY N/A 01/29/2020   Procedure: T8 KYPHOPLASTY;  Surgeon: Melina Schools, MD;  Location: [place];  Service: Orthopedics;  Laterality: N/A;  60 mins Local with [facility]   LEFT ATRIAL APPENDAGE OCCLUSION N/A 07/01/2020   Procedure: LEFT ATRIAL APPENDAGE OCCLUSION;  Surgeon: Vickie Epley, MD;  Location: [place] CV [facility];  Service: Cardiovascular;  Laterality: N/A;   mass fallopian tube     SPINAL CORD STIMULATOR BATTERY EXCHANGE N/A 09/09/2015   Procedure: SPINAL CORD STIMULATOR BATTERY EXCHANGE;  Surgeon: Melina Schools, MD;  Location: [place];  Service: Orthopedics;  Laterality: N/A;   SPINAL CORD STIMULATOR BATTERY EXCHANGE N/A 09/08/2021   Procedure: SPINAL CORD STIMULATOR BATTERY EXCHANGE;  Surgeon: Melina Schools, MD;  Location: [place];  Service: Orthopedics;  Laterality: N/A;   TEE WITHOUT CARDIOVERSION N/A 08/18/2020   Procedure: TRANSESOPHAGEAL ECHOCARDIOGRAM (TEE);  Surgeon: Josue Hector, MD;  Location: [facility] ENDOSCOPY;  Service: Cardiovascular;  Laterality: N/A;   TEE WITHOUT CARDIOVERSION N/A 09/09/2020   Procedure: TRANSESOPHAGEAL ECHOCARDIOGRAM (TEE);  Surgeon: Josue Hector, MD;  Location: [facility] ENDOSCOPY;  Service: Cardiovascular;  Laterality: N/A;   TEE WITHOUT CARDIOVERSION N/A 11/18/2020   Procedure: TRANSESOPHAGEAL ECHOCARDIOGRAM (TEE);  Surgeon: Donato Heinz, MD;  Location: [place];  Service: Cardiovascular;  Laterality: N/A;   TOTAL KNEE ARTHROPLASTY Left 11/05/2017   Procedure: LEFT TOTAL KNEE ARTHROPLASTY;  Surgeon: Paralee Cancel, MD;  Location: [facility];  Service: Orthopedics;  Laterality: Left;  Adductor Block   TUBAL LIGATION     VAGINAL HYSTERECTOMY  1977   1 ovary removed    Current Medications: Current Meds  Medication Sig   albuterol (PROAIR HFA) 108 (90 BASE)  MCG/ACT inhaler Inhale 2 puffs into the lungs every 4 (four) hours as needed for wheezing or shortness of breath.   amitriptyline (ELAVIL) 50 MG tablet    amLODipine (NORVASC) 10 MG tablet    aspirin EC 81 MG tablet Take 1 tablet (81 mg total) by mouth daily. Swallow whole.   busPIRone (BUSPAR) 5 MG tablet    Continuous Blood Gluc Sensor (FREESTYLE LIBRE 14 DAY SENSOR) MISC Inject 1 patch into the skin every 14 (fourteen) days.   diphenhydrAMINE (BENADRYL) 25 MG tablet Take 25 mg by mouth daily as needed for allergies.   docusate sodium (COLACE) 100 MG capsule Take 100 mg by mouth daily as needed for moderate constipation.   DROPLET PEN NEEDLES 32G X 4 MM MISC    esomeprazole (NEXIUM) 20 MG capsule Take 1 capsule every day by oral route.   furosemide (LASIX) 40 MG tablet TAKE 1 TABLET BY MOUTH EVERY DAY   gabapentin (NEURONTIN) 300 MG capsule Take 300-600 mg by mouth See admin instructions. Take 300 mg in the morning and 600 mg at bedtime   insulin glargine (LANTUS) 100 UNIT/ML Solostar Pen Inject 22 Units into the skin at bedtime.   insulin lispro (HUMALOG KWIKPEN) 100 UNIT/ML KwikPen    JARDIANCE 10 MG TABS tablet Take by mouth.   KLOR-CON M20 20 MEQ tablet TAKE 2 TABLETS BY MOUTH DAILY   LORazepam (ATIVAN) 1 MG tablet    losartan (COZAAR) 100 MG tablet    magnesium oxide (MAG-OX) 400 (240 Mg) MG tablet TAKE 1/2 A TABLET (200 MG TOTAL) BY MOUTH DAILY.   metoCLOPramide (REGLAN) 10 MG tablet    metoprolol tartrate (LOPRESSOR) 100 MG tablet Take 1 tablet (100 mg total) by mouth 2 (two) times daily.   Multiple Vitamins-Minerals (PRESERVISION AREDS 2 PO) Take 1 capsule by mouth 2 (two) times daily.    NOVOLOG FLEXPEN 100 UNIT/ML FlexPen Inject 10-14 Units into the skin See admin instructions. Inject 10-14 units into the skin, PER SLIDING SCALE, in the morning, noon, and bedtime   omega-3 acid ethyl esters (LOVAZA) 1 g capsule Take 1 g by mouth daily.   pantoprazole (PROTONIX) 40 MG tablet Take  40 mg by mouth daily as needed (Heartburn).   Pitavastatin Calcium (LIVALO) 1 MG TABS Take 2 mg by mouth at bedtime.   Triamterene (DYRENIUM PO)    TRULICITY 9.41 DE/0.8XK SOPN Inject 0.75 mg into the skin once a week.   venlafaxine (EFFEXOR) 37.5 MG tablet Take 37.5 mg by mouth 2 (two) times daily.   [DISCONTINUED] ipratropium (ATROVENT) 0.06 % nasal spray Place 1 spray into both nostrils daily.     Allergies:   Vasotec, Atorvastatin, Codeine, Cymbalta [duloxetine hcl], Lansoprazole, Morphine and related, Sulfate, Amitriptyline  hcl, Escitalopram oxalate, Hydrocodone, Iron, Sertraline hcl, Tramadol, Adhesive [tape], Allevyn adhesive [wound dressings], and Scopolamine   Social History   Socioeconomic History   Marital status: Married    Spouse name: Johnny   Number of children: 2   Years of education: 16   Highest education level: Not on file  Occupational History    Comment: retired Pharmacist, hospital, IT sales professional  Tobacco Use   Smoking status: Never   Smokeless tobacco: Never  Vaping Use   Vaping Use: Never used  Substance and Sexual Activity   Alcohol use: No   Drug use: No   Sexual activity: Not Currently    Birth control/protection: Surgical  Other Topics Concern   Not on file  Social History Narrative   Lives with husband   Caffeine use- none   Social Determinants of Health   Financial Resource Strain: Low Risk  (05/11/2022)   Overall Financial Resource Strain (CARDIA)    Difficulty of Paying Living Expenses: Not hard at all  Food Insecurity: No Food Insecurity (05/11/2022)   Hunger Vital Sign    Worried About Running Out of Food in the Last Year: Never true    Ran Out of Food in the Last Year: Never true  Transportation Needs: No Transportation Needs (05/11/2022)   PRAPARE - Hydrologist (Medical): No    Lack of Transportation (Non-Medical): No  Physical Activity: Not on file  Stress: No Stress Concern Present (05/11/2022)   [place]    Feeling of Stress : Only a little  Social Connections: Socially Integrated (05/11/2022)   Social Connection and Isolation Panel [NHANES]    Frequency of Communication with Friends and Family: More than three times a week    Frequency of Social Gatherings with Friends and Family: More than three times a week    Attends Religious Services: More than 4 times per year    Active Member of [employer] or Organizations: Yes    Attends Music therapist: More than 4 times per year    Marital Status: Married     Family History: The patient's family history includes Heart attack in her father; Lung cancer in her mother. There is no history of Colon cancer, Stroke, or Migraines.  ROS:   Please see the history of present illness. (+) Rhinorrhea All other systems are reviewed and negative.    EKGs/Labs/Other Studies Reviewed:    The following studies were reviewed today:  TEE 11/18/2020:  1. Left ventricular ejection fraction, by estimation, is 55 to 60%. The  left ventricle has normal function.   2. Right ventricular systolic function is normal. The right ventricular  size is normal. There is mildly elevated pulmonary artery systolic  pressure.   3. A small pericardial effusion is present.   4. Right atrial size was moderately dilated.   5. The mitral valve is normal in structure. Mild mitral valve  regurgitation.   6. The aortic valve is tricuspid. Aortic valve regurgitation is not  visualized.   7. Tricuspid valve regurgitation is moderate.   8. S/p Watchman LAA occlusion device, appears well sealed. No thrombus  seen. Left atrial size was moderately dilated.  Monitor 10/28/2020: HR 46-129bpm, average 68bpm. AF 100% of monitoring period. Rare ventricular ectopy.  Echo 07/13/2020:  1. Left ventricular ejection fraction, by estimation, is 55 to 60%. The  left ventricle has normal function. The left ventricle has no  regional  wall  motion abnormalities. There is mild asymmetric left ventricular  hypertrophy of the posterior-lateral segment. Left ventricular diastolic parameters are indeterminate. Elevated left ventricular end-diastolic pressure.   2. Right ventricular systolic function is mildly reduced. The right  ventricular size is mildly enlarged. There is normal pulmonary artery  systolic pressure. The estimated right ventricular systolic pressure is  84.1 mmHg.   3. Left atrial size was moderately dilated.   4. Right atrial size was severely dilated.   5. The mitral valve is abnormal. Trivial mitral valve regurgitation.  Moderate mitral annular calcification.   6. Tricuspid valve regurgitation is mild to moderate.   7. The aortic valve is grossly normal. Aortic valve regurgitation is not  visualized. No aortic stenosis is present.   8. The inferior vena cava is normal in size with greater than 50%  respiratory variability, suggesting right atrial pressure of 3 mmHg.   9. S/p 27 mm Watchman FLX 07/01/20, which is not well visualized or  assessed on this transthoracic echo.  Left Atrial Appendage Occlusion 07/01/2020: CONCLUSIONS:  1.  Successful implantation of a WATCHMAN left atrial appendage occlusive device (27 mm) 2.  TEE demonstrating no LAA thrombus 3.  Small pericardial effusion at the beginning of the case.  Slight increase in fluid in the transverse sinus by the end of the procedure, stable throughout the entire procedure.  Cardiac CTA 04/23/2020: FINDINGS: A 120 kV prospective scan was triggered in the descending thoracic aorta at 111 HU's. Gantry rotation speed was 280 msecs and collimation was .9 mm. No beta blockade and no NTG was given. The 3D data set was reconstructed in 5% intervals of the 60-80 % of the R-R cycle. Diastolic phases were analyzed on a dedicated work station using MPR, MIP and VRT modes. The patient received 80 cc of contrast.   There is normal pulmonary vein  drainage into the left atrium (2 on the right and 2 on the left) with ostial measurements as follows:   RUPV: 22.9 x 15.7 mm   RLPV: 16.9 x 13.8 mm   LUPV: 18.1 x 12.5 mm   LLPV: 15.9 x 11.8 mm   The left atrial appendage is very large with mixed chicken wing / broccoli morphology, with 1 major lobe, ostial size 22 x 21 mm and length 50 mm. There is no thrombus in the left atrial appendage.   The esophagus runs to the left from the left atrial midline and is in the proximity to the ostia of the LUPV and LLPV.   Aorta: Normal caliber. No dissection. Mild diffuse calcifications.   Aortic Valve: Trileaflet. Minimal calcifications.   Coronary Arteries: Normal coronary origin. Right dominance. The study was performed without use of NTG and insufficient for plaque evaluation.   Calcium score is 65 that represents 46 percentile for age/sex.   IMPRESSION: 1. There is normal pulmonary vein drainage into the left atrium.   2. The left atrial appendage is very large with mixed chicken wing / broccoli morphology, with 1 major lobe, ostial size 22 x 21 mm and length 50 mm. There is no thrombus in the left atrial appendage.   3. The esophagus runs to the left from the left atrial midline and is in the proximity to the ostia of the LUPV and LLPV.   EKG:  EKG is personally reviewed and interpreted. 06/28/2022: EKG was not ordered. 02/21/2022:  EKG was not ordered. 11/23/2020: coarse atrial fibrillation/flutter 73 bpm.   Recent Labs: 02/07/2022: Hemoglobin 14.7; NT-Pro BNP 643;  Platelets 205 03/01/2022: BUN 26; Creatinine, Ser 1.05; Potassium 4.4; Sodium 139   Recent Lipid Panel No results found for: "CHOL", "TRIG", "HDL", "CHOLHDL", "VLDL", "LDLCALC", "LDLDIRECT"   Risk Assessment/Calculations:      Physical Exam:    VS:  BP 120/70 (BP Location: Left Arm, Patient Position: Sitting, Cuff Size: Normal)   Pulse 69   Ht '5\' 4"'$  (1.626 m)   Wt 134 lb (60.8 kg)   SpO2 98%   BMI  23.00 kg/m     Wt Readings from Last 3 Encounters:  06/28/22 134 lb (60.8 kg)  02/21/22 139 lb (63 kg)  02/07/22 138 lb 12.8 oz (63 kg)     GEN: Well nourished, well developed in no acute distress HEENT: Normal NECK: No JVD; No carotid bruits LYMPHATICS: No lymphadenopathy CARDIAC: Regular rate and rhythm, no murmurs, rubs, gallops RESPIRATORY:  Clear to auscultation without rales, wheezing or rhonchi  ABDOMEN: Soft, non-tender, non-distended MUSCULOSKELETAL:  No edema; No deformity  SKIN: Warm and dry NEUROLOGIC:  Alert and oriented x 3 PSYCHIATRIC:  Normal affect    ASSESSMENT:    No diagnosis found.    PLAN:    In order of problems listed above:  Watchman device Doing very well no more GI bleeding.  Excellent.  Takes low-dose aspirin.  Paroxysmal atrial fibrillation (HCC) Currently in sinus rhythm.  Excellent.  Maintaining.  Watchman in place.  Previous bleeding on Eliquis.  Dr. Quentin Ore.  No changes made in medication management.  Medications reviewed.  Doing well.  Metoprolol   (HFpEF) heart failure with preserved ejection fraction (HCC) Mild lower extremity edema noted.  She may take an additional Lasix periodically.  Sometimes her Lasix does not work.  This is probably because she is euvolemic at the time.   Runny nose She has a chronically runny nose after she suffered a fall to her face where she chipped her teeth and hit her nose.  A prior ENT gave her Atrovent nasal spray or ipratropium.  This seemed to help.  She asked for a refill.  I will go ahead and give her a bottle.  I did ask her to ask Dr. Koleen Nimrod her primary care physician to give her refills.   Follow up: 1 yr  Medication Adjustments/Labs and Tests Ordered: Current medicines are reviewed at length with the patient today.  Concerns regarding medicines are outlined above.   No orders of the defined types were placed in this encounter.  Meds ordered this encounter  Medications   ipratropium  (ATROVENT) 0.06 % nasal spray    Sig: Place 1 spray into both nostrils daily.    Dispense:  15 mL    Refill:  1    Please obtained further refills from PCP   Patient Instructions  Medication Instructions:  The current medical regimen is effective;  continue present plan and medications.  *If you need a refill on your cardiac medications before your next appointment, please call your pharmacy*  Follow-Up: At [facility], you and your health needs are our priority.  As part of our continuing mission to provide you with exceptional heart care, we have created designated Provider Care Teams.  These Care Teams include your primary Cardiologist (physician) and Advanced Practice Providers (APPs -  Physician Assistants and Nurse Practitioners) who all work together to provide you with the care you need, when you need it.  We recommend signing up for the patient portal called "MyChart".  Sign up information is provided on this  After Visit Summary.  MyChart is used to connect with patients for Virtual Visits (Telemedicine).  Patients are able to view lab/test results, encounter notes, upcoming appointments, etc.  Non-urgent messages can be sent to your provider as well.   To learn more about what you can do with MyChart, go to [email or web address].    Your next appointment:   1 year(s)  The format for your next appointment:   In Person  Provider:   Candee Furbish, MD      Important Information About Sugar          I,Alexis Herring,acting as a scribe for Candee Furbish, MD.,have documented all relevant documentation on the behalf of Candee Furbish, MD,as directed by  Candee Furbish, MD while in the presence of Candee Furbish, MD.  I, Candee Furbish, MD, have reviewed all documentation for this visit. The documentation on 06/28/22 for the exam, diagnosis, procedures, and orders are all accurate and complete.   Signed, Candee Furbish, MD  06/28/2022 4:23 PM    [place]

## 2022-06-29 DIAGNOSIS — E1142 Type 2 diabetes mellitus with diabetic polyneuropathy: Secondary | ICD-10-CM | POA: Diagnosis not present

## 2022-07-06 ENCOUNTER — Ambulatory Visit (INDEPENDENT_AMBULATORY_CARE_PROVIDER_SITE_OTHER): Payer: Medicare PPO | Admitting: Podiatry

## 2022-07-06 ENCOUNTER — Encounter: Payer: Self-pay | Admitting: Podiatry

## 2022-07-06 DIAGNOSIS — E1142 Type 2 diabetes mellitus with diabetic polyneuropathy: Secondary | ICD-10-CM | POA: Diagnosis not present

## 2022-07-06 DIAGNOSIS — M79675 Pain in left toe(s): Secondary | ICD-10-CM | POA: Diagnosis not present

## 2022-07-06 DIAGNOSIS — M79674 Pain in right toe(s): Secondary | ICD-10-CM

## 2022-07-06 DIAGNOSIS — B351 Tinea unguium: Secondary | ICD-10-CM

## 2022-07-06 DIAGNOSIS — L84 Corns and callosities: Secondary | ICD-10-CM | POA: Diagnosis not present

## 2022-07-11 DIAGNOSIS — Z794 Long term (current) use of insulin: Secondary | ICD-10-CM | POA: Diagnosis not present

## 2022-07-11 DIAGNOSIS — G4762 Sleep related leg cramps: Secondary | ICD-10-CM | POA: Diagnosis not present

## 2022-07-11 DIAGNOSIS — E1142 Type 2 diabetes mellitus with diabetic polyneuropathy: Secondary | ICD-10-CM | POA: Diagnosis not present

## 2022-07-11 NOTE — Progress Notes (Signed)
  Subjective:  Patient ID: [name], female    DOB: 1941/01/21,  MRN: [id number]  KATLYNNE MCKERCHER presents to clinic today for at risk foot care with history of diabetic neuropathy and callus(es) b/l lower extremities and painful thick toenails that are difficult to trim. Painful toenails interfere with ambulation. Aggravating factors include wearing enclosed shoe gear. Pain is relieved with periodic professional debridement. Painful calluses are aggravated when weightbearing with and without shoegear. Pain is relieved with periodic professional debridement.  Chief Complaint  Patient presents with   Nail Problem    DFC  BG - 131 A1C - 5.7 PCP - Dr Andreas Newport , last OV June 2023    New problem(s): None.   PCP is Kathalene Frames, MD.  Allergies  Allergen Reactions   Vasotec Shortness Of Breath and Other (See Comments)    Wheezing, also   Atorvastatin Other (See Comments)    Leg cramps    Codeine Nausea And Vomiting   Cymbalta [Duloxetine Hcl] Itching   Lansoprazole Itching, Swelling, Rash and Other (See Comments)    Redness, swelling of mouth. During 07/12/20-07/15/20 pt tolerated IV protonix infusion with no reactions.   Morphine And Related Itching   Sulfate Itching   Amitriptyline Hcl Other (See Comments)     sleepwalking   Escitalopram Oxalate Itching   Hydrocodone Itching   Iron Other (See Comments)    constipated   Sertraline Hcl Itching   Tramadol Itching   Adhesive [Tape] Rash   Allevyn Adhesive [Wound Dressings] Rash   Scopolamine Rash   Review of Systems: Negative except as noted in the HPI.  Objective: No changes noted in today's physical examination.  [name] is a pleasant 81 y.o. female in NAD. AAO x 3.  Vascular Examination: CFT <4 seconds b/l. DP/PT pulses faintly palpable b/l. Skin temperature gradient warm to warm b/l. No ischemia or gangrene. No cyanosis or clubbing noted b/l.    Neurological Examination: Pt has subjective  symptoms of neuropathy. Protective sensation diminished with 10g monofilament b/l. Vibratory sensation intact b/l.  Dermatological Examination: Pedal skin thin, shiny and atrophic b/l. Toenails 1-5 b/l thick, discolored, elongated with subungual debris and pain on dorsal palpation.    Hyperkeratotic lesion(s) 1st metatarsal head b/l lower extremities.  No erythema, no edema, no drainage, no fluctuance.  Musculoskeletal Examination: Muscle strength 5/5 to all lower extremity muscle groups bilaterally. No pain, crepitus or joint limitation noted with ROM bilateral LE. HAV with bunion deformity noted b/l LE.  Assessment/Plan: 1. Pain due to onychomycosis of toenails of both feet   2. Callus   3. Diabetic peripheral neuropathy associated with type 2 diabetes mellitus ([place])     No orders of the defined types were placed in this encounter.   -Patient was evaluated and treated. All patient's and/or POA's questions/concerns answered on today's visit. -Continue diabetic foot care principles: inspect feet daily, monitor glucose as recommended by PCP and/or Endocrinologist, and follow prescribed diet per PCP, Endocrinologist and/or dietician. -Mycotic toenails 1-5 bilaterally were debrided in length and girth with sterile nail nippers and dremel without incident. -Callus(es) submet head 1 b/l pared utilizing sterile scalpel blade without complication or incident. Total number debrided =2. -Patient/POA to call should there be question/concern in the interim.   Return in about 3 months (around 10/06/2022).  Marzetta Board, DPM

## 2022-08-07 ENCOUNTER — Ambulatory Visit: Payer: Medicare PPO | Admitting: Cardiology

## 2022-08-11 ENCOUNTER — Other Ambulatory Visit: Payer: Self-pay | Admitting: Internal Medicine

## 2022-08-11 ENCOUNTER — Ambulatory Visit
Admission: RE | Admit: 2022-08-11 | Discharge: 2022-08-11 | Disposition: A | Discharge status: Home / Self Care | Payer: Medicare PPO | Source: Ambulatory Visit | Attending: Internal Medicine | Admitting: Internal Medicine

## 2022-08-11 DIAGNOSIS — K2100 Gastro-esophageal reflux disease with esophagitis, without bleeding: Secondary | ICD-10-CM | POA: Diagnosis not present

## 2022-08-11 DIAGNOSIS — I4891 Unspecified atrial fibrillation: Secondary | ICD-10-CM | POA: Diagnosis not present

## 2022-08-11 DIAGNOSIS — E782 Mixed hyperlipidemia: Secondary | ICD-10-CM | POA: Diagnosis not present

## 2022-08-11 DIAGNOSIS — E1142 Type 2 diabetes mellitus with diabetic polyneuropathy: Secondary | ICD-10-CM | POA: Diagnosis not present

## 2022-08-11 DIAGNOSIS — M25551 Pain in right hip: Secondary | ICD-10-CM

## 2022-08-11 DIAGNOSIS — I5032 Chronic diastolic (congestive) heart failure: Secondary | ICD-10-CM | POA: Diagnosis not present

## 2022-08-11 DIAGNOSIS — I4819 Other persistent atrial fibrillation: Secondary | ICD-10-CM | POA: Diagnosis not present

## 2022-08-11 DIAGNOSIS — F419 Anxiety disorder, unspecified: Secondary | ICD-10-CM | POA: Diagnosis not present

## 2022-08-11 DIAGNOSIS — M8588 Other specified disorders of bone density and structure, other site: Secondary | ICD-10-CM | POA: Diagnosis not present

## 2022-08-11 DIAGNOSIS — F339 Major depressive disorder, recurrent, unspecified: Secondary | ICD-10-CM | POA: Diagnosis not present

## 2022-08-11 DIAGNOSIS — M1611 Unilateral primary osteoarthritis, right hip: Secondary | ICD-10-CM | POA: Diagnosis not present

## 2022-08-15 ENCOUNTER — Other Ambulatory Visit: Payer: Self-pay | Admitting: Internal Medicine

## 2022-08-15 DIAGNOSIS — M8580 Other specified disorders of bone density and structure, unspecified site: Secondary | ICD-10-CM

## 2022-08-27 ENCOUNTER — Other Ambulatory Visit: Payer: Self-pay | Admitting: Cardiology

## 2022-09-15 ENCOUNTER — Other Ambulatory Visit: Payer: Self-pay | Admitting: Cardiology

## 2022-09-18 ENCOUNTER — Ambulatory Visit
Admission: RE | Admit: 2022-09-18 | Discharge: 2022-09-18 | Disposition: A | Discharge status: Home / Self Care | Payer: Medicare PPO | Source: Ambulatory Visit | Attending: Internal Medicine | Admitting: Internal Medicine

## 2022-09-18 DIAGNOSIS — Z780 Asymptomatic menopausal state: Secondary | ICD-10-CM | POA: Diagnosis not present

## 2022-09-18 DIAGNOSIS — M8580 Other specified disorders of bone density and structure, unspecified site: Secondary | ICD-10-CM

## 2022-09-18 DIAGNOSIS — M810 Age-related osteoporosis without current pathological fracture: Secondary | ICD-10-CM | POA: Diagnosis not present

## 2022-09-19 ENCOUNTER — Telehealth: Payer: Self-pay | Admitting: Cardiology

## 2022-09-19 ENCOUNTER — Other Ambulatory Visit: Payer: Self-pay | Admitting: *Deleted

## 2022-09-19 MED ORDER — METOPROLOL TARTRATE 100 MG PO TABS: 100.0000 mg | ORAL_TABLET | Freq: Two times a day (BID) | ORAL | 3 refills | Status: DC

## 2022-09-19 NOTE — Telephone Encounter (Signed)
*  STAT* If patient is at the pharmacy, call can be transferred to refill team.   1. Which medications need to be refilled? (please list name of each medication and dose if known)   metoprolol tartrate (LOPRESSOR) 100 MG tablet    2. Which pharmacy/location (including street and city if local pharmacy) is medication to be sent to?  [id number]- [place], [place] - [address]    3. Do they need a 30 day or 90 day supply? 90  Pt states she is completely out

## 2022-09-25 DIAGNOSIS — I509 Heart failure, unspecified: Secondary | ICD-10-CM | POA: Diagnosis not present

## 2022-09-25 DIAGNOSIS — R32 Unspecified urinary incontinence: Secondary | ICD-10-CM | POA: Diagnosis not present

## 2022-09-25 DIAGNOSIS — Z794 Long term (current) use of insulin: Secondary | ICD-10-CM | POA: Diagnosis not present

## 2022-09-25 DIAGNOSIS — K219 Gastro-esophageal reflux disease without esophagitis: Secondary | ICD-10-CM | POA: Diagnosis not present

## 2022-09-25 DIAGNOSIS — K5900 Constipation, unspecified: Secondary | ICD-10-CM | POA: Diagnosis not present

## 2022-09-25 DIAGNOSIS — R269 Unspecified abnormalities of gait and mobility: Secondary | ICD-10-CM | POA: Diagnosis not present

## 2022-09-25 DIAGNOSIS — F419 Anxiety disorder, unspecified: Secondary | ICD-10-CM | POA: Diagnosis not present

## 2022-09-25 DIAGNOSIS — E785 Hyperlipidemia, unspecified: Secondary | ICD-10-CM | POA: Diagnosis not present

## 2022-09-25 DIAGNOSIS — I499 Cardiac arrhythmia, unspecified: Secondary | ICD-10-CM | POA: Diagnosis not present

## 2022-09-27 DIAGNOSIS — E1142 Type 2 diabetes mellitus with diabetic polyneuropathy: Secondary | ICD-10-CM | POA: Diagnosis not present

## 2022-10-04 DIAGNOSIS — H0102B Squamous blepharitis left eye, upper and lower eyelids: Secondary | ICD-10-CM | POA: Diagnosis not present

## 2022-10-04 DIAGNOSIS — H0102A Squamous blepharitis right eye, upper and lower eyelids: Secondary | ICD-10-CM | POA: Diagnosis not present

## 2022-10-04 DIAGNOSIS — H04123 Dry eye syndrome of bilateral lacrimal glands: Secondary | ICD-10-CM | POA: Diagnosis not present

## 2022-10-04 DIAGNOSIS — Z961 Presence of intraocular lens: Secondary | ICD-10-CM | POA: Diagnosis not present

## 2022-10-04 DIAGNOSIS — H11123 Conjunctival concretions, bilateral: Secondary | ICD-10-CM | POA: Diagnosis not present

## 2022-10-04 DIAGNOSIS — H1045 Other chronic allergic conjunctivitis: Secondary | ICD-10-CM | POA: Diagnosis not present

## 2022-10-04 DIAGNOSIS — E119 Type 2 diabetes mellitus without complications: Secondary | ICD-10-CM | POA: Diagnosis not present

## 2022-10-04 DIAGNOSIS — H353131 Nonexudative age-related macular degeneration, bilateral, early dry stage: Secondary | ICD-10-CM | POA: Diagnosis not present

## 2022-10-04 DIAGNOSIS — H43393 Other vitreous opacities, bilateral: Secondary | ICD-10-CM | POA: Diagnosis not present

## 2022-10-16 ENCOUNTER — Other Ambulatory Visit (HOSPITAL_COMMUNITY): Payer: Self-pay | Admitting: Internal Medicine

## 2022-10-16 DIAGNOSIS — H0102A Squamous blepharitis right eye, upper and lower eyelids: Secondary | ICD-10-CM | POA: Diagnosis not present

## 2022-10-16 DIAGNOSIS — H1045 Other chronic allergic conjunctivitis: Secondary | ICD-10-CM | POA: Diagnosis not present

## 2022-10-16 DIAGNOSIS — E119 Type 2 diabetes mellitus without complications: Secondary | ICD-10-CM | POA: Diagnosis not present

## 2022-10-16 DIAGNOSIS — S0990XA Unspecified injury of head, initial encounter: Secondary | ICD-10-CM | POA: Diagnosis not present

## 2022-10-16 DIAGNOSIS — Z961 Presence of intraocular lens: Secondary | ICD-10-CM | POA: Diagnosis not present

## 2022-10-16 DIAGNOSIS — H353131 Nonexudative age-related macular degeneration, bilateral, early dry stage: Secondary | ICD-10-CM | POA: Diagnosis not present

## 2022-10-16 DIAGNOSIS — H43393 Other vitreous opacities, bilateral: Secondary | ICD-10-CM | POA: Diagnosis not present

## 2022-10-16 DIAGNOSIS — H1131 Conjunctival hemorrhage, right eye: Secondary | ICD-10-CM | POA: Diagnosis not present

## 2022-10-16 DIAGNOSIS — H11123 Conjunctival concretions, bilateral: Secondary | ICD-10-CM | POA: Diagnosis not present

## 2022-10-16 DIAGNOSIS — H04123 Dry eye syndrome of bilateral lacrimal glands: Secondary | ICD-10-CM | POA: Diagnosis not present

## 2022-10-16 DIAGNOSIS — Z9181 History of falling: Secondary | ICD-10-CM | POA: Diagnosis not present

## 2022-10-17 ENCOUNTER — Ambulatory Visit (HOSPITAL_COMMUNITY)
Admission: RE | Admit: 2022-10-17 | Discharge: 2022-10-17 | Disposition: A | Discharge status: Home / Self Care | Payer: Medicare PPO | Source: Ambulatory Visit | Attending: Internal Medicine | Admitting: Internal Medicine

## 2022-10-17 DIAGNOSIS — S0990XA Unspecified injury of head, initial encounter: Secondary | ICD-10-CM | POA: Insufficient documentation

## 2022-10-20 DIAGNOSIS — M25551 Pain in right hip: Secondary | ICD-10-CM | POA: Diagnosis not present

## 2022-10-20 DIAGNOSIS — R2681 Unsteadiness on feet: Secondary | ICD-10-CM | POA: Diagnosis not present

## 2022-10-20 DIAGNOSIS — M6281 Muscle weakness (generalized): Secondary | ICD-10-CM | POA: Diagnosis not present

## 2022-10-20 DIAGNOSIS — R293 Abnormal posture: Secondary | ICD-10-CM | POA: Diagnosis not present

## 2022-10-20 DIAGNOSIS — R296 Repeated falls: Secondary | ICD-10-CM | POA: Diagnosis not present

## 2022-10-23 ENCOUNTER — Encounter: Payer: Self-pay | Admitting: Oncology

## 2022-10-24 DIAGNOSIS — R296 Repeated falls: Secondary | ICD-10-CM | POA: Diagnosis not present

## 2022-10-24 DIAGNOSIS — M25551 Pain in right hip: Secondary | ICD-10-CM | POA: Diagnosis not present

## 2022-10-24 DIAGNOSIS — R2681 Unsteadiness on feet: Secondary | ICD-10-CM | POA: Diagnosis not present

## 2022-10-24 DIAGNOSIS — R293 Abnormal posture: Secondary | ICD-10-CM | POA: Diagnosis not present

## 2022-10-24 DIAGNOSIS — M6281 Muscle weakness (generalized): Secondary | ICD-10-CM | POA: Diagnosis not present

## 2022-10-26 ENCOUNTER — Other Ambulatory Visit: Payer: Self-pay | Admitting: Internal Medicine

## 2022-10-26 DIAGNOSIS — Z1231 Encounter for screening mammogram for malignant neoplasm of breast: Secondary | ICD-10-CM

## 2022-10-26 DIAGNOSIS — R296 Repeated falls: Secondary | ICD-10-CM | POA: Diagnosis not present

## 2022-10-26 DIAGNOSIS — R293 Abnormal posture: Secondary | ICD-10-CM | POA: Diagnosis not present

## 2022-10-26 DIAGNOSIS — M6281 Muscle weakness (generalized): Secondary | ICD-10-CM | POA: Diagnosis not present

## 2022-10-26 DIAGNOSIS — R2681 Unsteadiness on feet: Secondary | ICD-10-CM | POA: Diagnosis not present

## 2022-10-26 DIAGNOSIS — M25551 Pain in right hip: Secondary | ICD-10-CM | POA: Diagnosis not present

## 2022-10-31 DIAGNOSIS — M6281 Muscle weakness (generalized): Secondary | ICD-10-CM | POA: Diagnosis not present

## 2022-10-31 DIAGNOSIS — R2681 Unsteadiness on feet: Secondary | ICD-10-CM | POA: Diagnosis not present

## 2022-10-31 DIAGNOSIS — R293 Abnormal posture: Secondary | ICD-10-CM | POA: Diagnosis not present

## 2022-10-31 DIAGNOSIS — R296 Repeated falls: Secondary | ICD-10-CM | POA: Diagnosis not present

## 2022-10-31 DIAGNOSIS — M25551 Pain in right hip: Secondary | ICD-10-CM | POA: Diagnosis not present

## 2022-11-02 ENCOUNTER — Ambulatory Visit: Payer: Medicare PPO | Admitting: Podiatry

## 2022-11-02 DIAGNOSIS — M6281 Muscle weakness (generalized): Secondary | ICD-10-CM | POA: Diagnosis not present

## 2022-11-02 DIAGNOSIS — R293 Abnormal posture: Secondary | ICD-10-CM | POA: Diagnosis not present

## 2022-11-02 DIAGNOSIS — R296 Repeated falls: Secondary | ICD-10-CM | POA: Diagnosis not present

## 2022-11-02 DIAGNOSIS — M25551 Pain in right hip: Secondary | ICD-10-CM | POA: Diagnosis not present

## 2022-11-02 DIAGNOSIS — R2681 Unsteadiness on feet: Secondary | ICD-10-CM | POA: Diagnosis not present

## 2022-11-06 DIAGNOSIS — R0989 Other specified symptoms and signs involving the circulatory and respiratory systems: Secondary | ICD-10-CM | POA: Diagnosis not present

## 2022-11-06 DIAGNOSIS — E1142 Type 2 diabetes mellitus with diabetic polyneuropathy: Secondary | ICD-10-CM | POA: Diagnosis not present

## 2022-11-06 DIAGNOSIS — R202 Paresthesia of skin: Secondary | ICD-10-CM | POA: Diagnosis not present

## 2022-11-06 DIAGNOSIS — G629 Polyneuropathy, unspecified: Secondary | ICD-10-CM | POA: Diagnosis not present

## 2022-11-07 DIAGNOSIS — M6281 Muscle weakness (generalized): Secondary | ICD-10-CM | POA: Diagnosis not present

## 2022-11-07 DIAGNOSIS — R293 Abnormal posture: Secondary | ICD-10-CM | POA: Diagnosis not present

## 2022-11-07 DIAGNOSIS — R2681 Unsteadiness on feet: Secondary | ICD-10-CM | POA: Diagnosis not present

## 2022-11-07 DIAGNOSIS — M25551 Pain in right hip: Secondary | ICD-10-CM | POA: Diagnosis not present

## 2022-11-07 DIAGNOSIS — R296 Repeated falls: Secondary | ICD-10-CM | POA: Diagnosis not present

## 2022-11-09 ENCOUNTER — Ambulatory Visit (INDEPENDENT_AMBULATORY_CARE_PROVIDER_SITE_OTHER): Payer: Medicare PPO | Admitting: Podiatry

## 2022-11-09 ENCOUNTER — Encounter: Payer: Self-pay | Admitting: Podiatry

## 2022-11-09 ENCOUNTER — Encounter: Payer: Self-pay | Admitting: Oncology

## 2022-11-09 VITALS — BP 132/61 | HR 67

## 2022-11-09 DIAGNOSIS — M79675 Pain in left toe(s): Secondary | ICD-10-CM

## 2022-11-09 DIAGNOSIS — M25551 Pain in right hip: Secondary | ICD-10-CM | POA: Diagnosis not present

## 2022-11-09 DIAGNOSIS — M79674 Pain in right toe(s): Secondary | ICD-10-CM

## 2022-11-09 DIAGNOSIS — R2681 Unsteadiness on feet: Secondary | ICD-10-CM | POA: Diagnosis not present

## 2022-11-09 DIAGNOSIS — M6281 Muscle weakness (generalized): Secondary | ICD-10-CM | POA: Diagnosis not present

## 2022-11-09 DIAGNOSIS — R296 Repeated falls: Secondary | ICD-10-CM | POA: Diagnosis not present

## 2022-11-09 DIAGNOSIS — E1142 Type 2 diabetes mellitus with diabetic polyneuropathy: Secondary | ICD-10-CM | POA: Diagnosis not present

## 2022-11-09 DIAGNOSIS — B351 Tinea unguium: Secondary | ICD-10-CM

## 2022-11-09 DIAGNOSIS — R293 Abnormal posture: Secondary | ICD-10-CM | POA: Diagnosis not present

## 2022-11-09 NOTE — Progress Notes (Signed)
  Subjective:  Patient ID: [name], female    DOB: 01/01/1941,  MRN: [id number]  [name] presents to clinic today for at risk foot care with history of diabetic neuropathy and painful elongated mycotic toenails 1-5 bilaterally which are tender when wearing enclosed shoe gear. Pain is relieved with periodic professional debridement.  Chief Complaint  Patient presents with   Diabetes    Diabetic foot care, A1c- 6.4 BG-120, Nail trim    New problem(s): None.   Patient states she has an abrasion on her right 2nd toe.   PCP is Nancy Frames, MD.  Allergies  Allergen Reactions   Vasotec Shortness Of Breath and Other (See Comments)    Wheezing, also   Atorvastatin Other (See Comments)    Leg cramps    Codeine Nausea And Vomiting   Cymbalta [Duloxetine Hcl] Itching   Lansoprazole Itching, Swelling, Rash and Other (See Comments)    Redness, swelling of mouth. During 07/12/20-07/15/20 pt tolerated IV protonix infusion with no reactions.   Morphine And Related Itching   Sulfate Itching   Amitriptyline Hcl Other (See Comments)     sleepwalking   Escitalopram Oxalate Itching   Hydrocodone Itching   Iron Other (See Comments)    constipated   Sertraline Hcl Itching   Tramadol Itching   Adhesive [Tape] Rash   Allevyn Adhesive [Wound Dressings] Rash   Scopolamine Rash    Review of Systems: Negative except as noted in the HPI.  Objective: No changes noted in today's physical examination. Vitals:   11/09/22 1356  BP: 132/61  Pulse: [name] is a pleasant 82 y.o. female WD, WN in NAD. AAO x 3.  Vascular Examination: CFT <4 seconds b/l. DP/PT pulses faintly palpable b/l. Skin temperature gradient warm to warm b/l. No ischemia or gangrene. No cyanosis or clubbing noted b/l.    Neurological Examination: Pt has subjective symptoms of neuropathy. Protective sensation diminished with 10g monofilament b/l. Vibratory sensation intact  b/l.  Dermatological Examination: Pedal skin thin, shiny and atrophic b/l. Toenails 1-5 b/l thick, discolored, elongated with subungual debris and pain on dorsal palpation.    Hyperkeratotic lesion(s) 1st metatarsal head b/l lower extremities.  No erythema, no edema, no drainage, no fluctuance.  Musculoskeletal Examination: Muscle strength 5/5 to all lower extremity muscle groups bilaterally. No pain, crepitus or joint limitation noted with ROM bilateral LE. HAV with bunion deformity noted b/l LE.  Assessment/Plan: 1. Pain due to onychomycosis of toenails of both feet   2. Diabetic peripheral neuropathy associated with type 2 diabetes mellitus ([place])     -Consent given for treatment as described below: -Examined patient. -Continue foot and shoe inspections daily. Monitor blood glucose per PCP/Endocrinologist's recommendations. -Toenails 1-5 b/l were debrided in length and girth with sterile nail nippers and dremel without iatrogenic bleeding.  -Patient/POA to call should there be question/concern in the interim.   Return in about 9 weeks (around 01/11/2023).  Marzetta Board, DPM

## 2022-11-10 DIAGNOSIS — R0989 Other specified symptoms and signs involving the circulatory and respiratory systems: Secondary | ICD-10-CM | POA: Diagnosis not present

## 2022-11-10 DIAGNOSIS — I743 Embolism and thrombosis of arteries of the lower extremities: Secondary | ICD-10-CM | POA: Diagnosis not present

## 2022-11-14 DIAGNOSIS — E1142 Type 2 diabetes mellitus with diabetic polyneuropathy: Secondary | ICD-10-CM | POA: Diagnosis not present

## 2022-11-14 DIAGNOSIS — R110 Nausea: Secondary | ICD-10-CM | POA: Diagnosis not present

## 2022-11-14 DIAGNOSIS — Z794 Long term (current) use of insulin: Secondary | ICD-10-CM | POA: Diagnosis not present

## 2022-11-16 DIAGNOSIS — R296 Repeated falls: Secondary | ICD-10-CM | POA: Diagnosis not present

## 2022-11-16 DIAGNOSIS — M25551 Pain in right hip: Secondary | ICD-10-CM | POA: Diagnosis not present

## 2022-11-16 DIAGNOSIS — R293 Abnormal posture: Secondary | ICD-10-CM | POA: Diagnosis not present

## 2022-11-16 DIAGNOSIS — R2681 Unsteadiness on feet: Secondary | ICD-10-CM | POA: Diagnosis not present

## 2022-11-16 DIAGNOSIS — M6281 Muscle weakness (generalized): Secondary | ICD-10-CM | POA: Diagnosis not present

## 2022-11-21 DIAGNOSIS — M25551 Pain in right hip: Secondary | ICD-10-CM | POA: Diagnosis not present

## 2022-11-21 DIAGNOSIS — R296 Repeated falls: Secondary | ICD-10-CM | POA: Diagnosis not present

## 2022-11-21 DIAGNOSIS — M6281 Muscle weakness (generalized): Secondary | ICD-10-CM | POA: Diagnosis not present

## 2022-11-21 DIAGNOSIS — R2681 Unsteadiness on feet: Secondary | ICD-10-CM | POA: Diagnosis not present

## 2022-11-21 DIAGNOSIS — R293 Abnormal posture: Secondary | ICD-10-CM | POA: Diagnosis not present

## 2022-12-07 DIAGNOSIS — G629 Polyneuropathy, unspecified: Secondary | ICD-10-CM | POA: Diagnosis not present

## 2022-12-07 DIAGNOSIS — R252 Cramp and spasm: Secondary | ICD-10-CM | POA: Diagnosis not present

## 2022-12-07 DIAGNOSIS — D509 Iron deficiency anemia, unspecified: Secondary | ICD-10-CM | POA: Diagnosis not present

## 2022-12-07 DIAGNOSIS — R202 Paresthesia of skin: Secondary | ICD-10-CM | POA: Diagnosis not present

## 2022-12-07 DIAGNOSIS — I10 Essential (primary) hypertension: Secondary | ICD-10-CM | POA: Diagnosis not present

## 2022-12-08 ENCOUNTER — Ambulatory Visit
Admission: RE | Admit: 2022-12-08 | Discharge: 2022-12-08 | Disposition: A | Discharge status: Home / Self Care | Payer: Medicare PPO | Source: Ambulatory Visit | Attending: Internal Medicine | Admitting: Internal Medicine

## 2022-12-08 DIAGNOSIS — Z1231 Encounter for screening mammogram for malignant neoplasm of breast: Secondary | ICD-10-CM

## 2022-12-19 ENCOUNTER — Other Ambulatory Visit: Payer: Self-pay | Admitting: Cardiology

## 2022-12-26 DIAGNOSIS — E1142 Type 2 diabetes mellitus with diabetic polyneuropathy: Secondary | ICD-10-CM | POA: Diagnosis not present

## 2022-12-27 DIAGNOSIS — F339 Major depressive disorder, recurrent, unspecified: Secondary | ICD-10-CM | POA: Diagnosis not present

## 2022-12-27 DIAGNOSIS — H9193 Unspecified hearing loss, bilateral: Secondary | ICD-10-CM | POA: Diagnosis not present

## 2022-12-27 DIAGNOSIS — R202 Paresthesia of skin: Secondary | ICD-10-CM | POA: Diagnosis not present

## 2022-12-27 DIAGNOSIS — I110 Hypertensive heart disease with heart failure: Secondary | ICD-10-CM | POA: Diagnosis not present

## 2022-12-27 DIAGNOSIS — I5032 Chronic diastolic (congestive) heart failure: Secondary | ICD-10-CM | POA: Diagnosis not present

## 2022-12-27 DIAGNOSIS — J310 Chronic rhinitis: Secondary | ICD-10-CM | POA: Diagnosis not present

## 2022-12-27 DIAGNOSIS — G629 Polyneuropathy, unspecified: Secondary | ICD-10-CM | POA: Diagnosis not present

## 2022-12-27 DIAGNOSIS — I4891 Unspecified atrial fibrillation: Secondary | ICD-10-CM | POA: Diagnosis not present

## 2022-12-27 DIAGNOSIS — R252 Cramp and spasm: Secondary | ICD-10-CM | POA: Diagnosis not present

## 2023-01-11 ENCOUNTER — Ambulatory Visit: Payer: Medicare PPO | Admitting: Podiatry

## 2023-01-24 ENCOUNTER — Ambulatory Visit (INDEPENDENT_AMBULATORY_CARE_PROVIDER_SITE_OTHER): Payer: Medicare PPO | Admitting: Podiatry

## 2023-01-24 DIAGNOSIS — B351 Tinea unguium: Secondary | ICD-10-CM

## 2023-01-24 DIAGNOSIS — M79674 Pain in right toe(s): Secondary | ICD-10-CM | POA: Diagnosis not present

## 2023-01-24 DIAGNOSIS — M79675 Pain in left toe(s): Secondary | ICD-10-CM

## 2023-01-25 ENCOUNTER — Encounter: Payer: Self-pay | Admitting: Podiatry

## 2023-01-25 DIAGNOSIS — E1142 Type 2 diabetes mellitus with diabetic polyneuropathy: Secondary | ICD-10-CM | POA: Diagnosis not present

## 2023-01-25 NOTE — Progress Notes (Signed)
       Subjective:  Patient ID: [name], female    DOB: May 18, 1941,  MRN: [id number]   Enis Gash presents to clinic today for:  Chief Complaint  Patient presents with   Nail Problem    Diabetic Foot Care   PCP- Eleanora Neighbor, MD Last visit- 6 weeks ago   . Patient notes nails are thick, discolored, elongated and painful in shoegear when trying to ambulate.    PCP is Emilio Aspen, MD.  Allergies  Allergen Reactions   Vasotec Shortness Of Breath and Other (See Comments)    Wheezing, also   Atorvastatin Other (See Comments)    Leg cramps    Codeine Nausea And Vomiting   Cymbalta [Duloxetine Hcl] Itching   Lansoprazole Itching, Swelling, Rash and Other (See Comments)    Redness, swelling of mouth. During 07/12/20-07/15/20 pt tolerated IV protonix infusion with no reactions.   Morphine And Codeine Itching   Sulfate Itching   Amitriptyline Hcl Other (See Comments)     sleepwalking   Escitalopram Oxalate Itching   Hydrocodone Itching   Iron Other (See Comments)    constipated   Sertraline Hcl Itching   Tramadol Itching   Adhesive [Tape] Rash   Allevyn Adhesive [Wound Dressings] Rash   Scopolamine Rash    Review of Systems: Negative except as noted in the HPI.  Objective:  There were no vitals filed for this visit.  Nancy NEUGEBAUER is a pleasant 82 y.o. female in NAD. AAO x 3.  Vascular Examination: Capillary refill time is 3-5 seconds to toes bilateral. Palpable pedal pulses b/l LE. Digital hair present b/l. No pedal edema b/l. Skin temperature gradient WNL b/l. No varicosities b/l. No cyanosis or clubbing noted b/l.   Dermatological Examination: Pedal skin with normal turgor, texture and tone b/l. No open wounds. No interdigital macerations b/l. Toenails x10 are 3mm thick, discolored, dystrophic with subungual debris. There is pain with compression of the nail plates.  They are elongated x10  Assessment/Plan: 1. Pain due to  onychomycosis of toenails of both feet     The mycotic toenails were sharply debrided x10 with sterile nail nippers and a power debriding burr to decrease bulk/thickness and length.    Return in about 3 months (around 04/26/2023) for [facility] / nail trim.   Clerance Lav, DPM, FACFAS [facility]     [address] [place], [place] [address]                Office [phone]  Fax [phone]

## 2023-02-15 DIAGNOSIS — E1142 Type 2 diabetes mellitus with diabetic polyneuropathy: Secondary | ICD-10-CM | POA: Diagnosis not present

## 2023-02-15 DIAGNOSIS — R6889 Other general symptoms and signs: Secondary | ICD-10-CM | POA: Diagnosis not present

## 2023-02-15 DIAGNOSIS — Z794 Long term (current) use of insulin: Secondary | ICD-10-CM | POA: Diagnosis not present

## 2023-02-19 DIAGNOSIS — H9193 Unspecified hearing loss, bilateral: Secondary | ICD-10-CM | POA: Diagnosis not present

## 2023-02-20 DIAGNOSIS — H9193 Unspecified hearing loss, bilateral: Secondary | ICD-10-CM | POA: Diagnosis not present

## 2023-02-21 DIAGNOSIS — E782 Mixed hyperlipidemia: Secondary | ICD-10-CM | POA: Diagnosis not present

## 2023-02-21 DIAGNOSIS — I480 Paroxysmal atrial fibrillation: Secondary | ICD-10-CM | POA: Diagnosis not present

## 2023-02-21 DIAGNOSIS — Z79899 Other long term (current) drug therapy: Secondary | ICD-10-CM | POA: Diagnosis not present

## 2023-02-21 DIAGNOSIS — Z1331 Encounter for screening for depression: Secondary | ICD-10-CM | POA: Diagnosis not present

## 2023-02-21 DIAGNOSIS — Z9181 History of falling: Secondary | ICD-10-CM | POA: Diagnosis not present

## 2023-02-21 DIAGNOSIS — Z5181 Encounter for therapeutic drug level monitoring: Secondary | ICD-10-CM | POA: Diagnosis not present

## 2023-02-21 DIAGNOSIS — Z23 Encounter for immunization: Secondary | ICD-10-CM | POA: Diagnosis not present

## 2023-02-21 DIAGNOSIS — I5032 Chronic diastolic (congestive) heart failure: Secondary | ICD-10-CM | POA: Diagnosis not present

## 2023-02-21 DIAGNOSIS — E1142 Type 2 diabetes mellitus with diabetic polyneuropathy: Secondary | ICD-10-CM | POA: Diagnosis not present

## 2023-02-21 DIAGNOSIS — Z0000 Encounter for general adult medical examination without abnormal findings: Secondary | ICD-10-CM | POA: Diagnosis not present

## 2023-02-21 DIAGNOSIS — I110 Hypertensive heart disease with heart failure: Secondary | ICD-10-CM | POA: Diagnosis not present

## 2023-02-21 DIAGNOSIS — F419 Anxiety disorder, unspecified: Secondary | ICD-10-CM | POA: Diagnosis not present

## 2023-02-22 ENCOUNTER — Ambulatory Visit: Payer: Medicare PPO | Attending: Student | Admitting: Student

## 2023-02-22 ENCOUNTER — Encounter: Payer: Self-pay | Admitting: Student

## 2023-02-22 VITALS — BP 148/80 | HR 57 | Ht 64.0 in | Wt 133.2 lb

## 2023-02-22 DIAGNOSIS — Z8719 Personal history of other diseases of the digestive system: Secondary | ICD-10-CM | POA: Diagnosis not present

## 2023-02-22 DIAGNOSIS — I4821 Permanent atrial fibrillation: Secondary | ICD-10-CM

## 2023-02-22 NOTE — Patient Instructions (Signed)
Medication Instructions:  Your physician recommends that you continue on your current medications as directed. Please refer to the Current Medication list given to you today.  *If you need a refill on your cardiac medications before your next appointment, please call your pharmacy*   Lab Work: None ordered If you have labs (blood work) drawn today and your tests are completely normal, you will receive your results only by: MyChart Message (if you have MyChart) OR A paper copy in the mail If you have any lab test that is abnormal or we need to change your treatment, we will call you to review the results   Follow-Up: At [facility], you and your health needs are our priority.  As part of our continuing mission to provide you with exceptional heart care, we have created designated Provider Care Teams.  These Care Teams include your primary Cardiologist (physician) and Advanced Practice Providers (APPs -  Physician Assistants and Nurse Practitioners) who all work together to provide you with the care you need, when you need it.  Your next appointment:   1 year(s)  Provider:   Cameron Lambert, MD  

## 2023-02-22 NOTE — Progress Notes (Signed)
  Electrophysiology Office Note:   Date:  02/22/2023  ID:  TAMANNA WHITSON, DOB [birth year], MRN [id number]  Primary Cardiologist: Donato Schultz, MD Electrophysiologist: Lanier Prude, MD      History of Present Illness:   [name] is a 82 y.o. female with h/o PAF, h/o GI bleeds, and s/p watchman left atrial appendage closure device seen today for routine electrophysiology followup.   Since last being seen in our clinic the patient reports doing very well.  she denies chest pain, palpitations, dyspnea, PND, orthopnea, nausea, vomiting, dizziness, syncope, edema, weight gain, or early satiety.   Review of systems complete and found to be negative unless listed in HPI.   Studies Reviewed:    EKG is ordered today. Personal review as below.  EKG Interpretation Date/Time:  Thursday February 22 2023 11:37:10 EDT Ventricular Rate:  57 PR Interval:  174 QRS Duration:  82 QT Interval:  432 QTC Calculation: 420 R Axis:   76  Text Interpretation: Sinus bradycardia, stable from prior. Confirmed by Maxine Glenn [phone]) on 02/22/2023 11:43:54 AM    TEE 11/18/2020 LVEF 55-60%, mod RAE, mild MR, Mod MR ; Well seated watchman device.       Physical Exam:   VS:  BP (!) 148/80   Pulse (!) 57   Ht 5\' 4"  (1.626 m)   Wt 133 lb 3.2 oz (60.4 kg)   SpO2 97%   BMI 22.86 kg/m    Wt Readings from Last 3 Encounters:  02/22/23 133 lb 3.2 oz (60.4 kg)  06/28/22 134 lb (60.8 kg)  02/21/22 139 lb (63 kg)     GEN: Well nourished, well developed in no acute distress NECK: No JVD; No carotid bruits CARDIAC: Regular rate and rhythm, no murmurs, rubs, gallops RESPIRATORY:  Clear to auscultation without rales, wheezing or rhonchi  ABDOMEN: Soft, non-tender, non-distended EXTREMITIES:  No edema; No deformity   ASSESSMENT AND PLAN:    Paroxysmal atrial fibrillation S/p Watchman 06/2020 EKG today shows sinus bradycardia No breakthrough AF that she is aware of.   History of GI  bleeding Watchman in place Continue ASA 81 mg monotherapy   Follow up with Dr. Lalla Brothers in 12 months  Signed, Graciella Freer, PA-C

## 2023-02-26 DIAGNOSIS — H9193 Unspecified hearing loss, bilateral: Secondary | ICD-10-CM | POA: Diagnosis not present

## 2023-03-07 DIAGNOSIS — I480 Paroxysmal atrial fibrillation: Secondary | ICD-10-CM | POA: Diagnosis not present

## 2023-03-07 DIAGNOSIS — E782 Mixed hyperlipidemia: Secondary | ICD-10-CM | POA: Diagnosis not present

## 2023-03-07 DIAGNOSIS — I110 Hypertensive heart disease with heart failure: Secondary | ICD-10-CM | POA: Diagnosis not present

## 2023-03-07 DIAGNOSIS — Z79899 Other long term (current) drug therapy: Secondary | ICD-10-CM | POA: Diagnosis not present

## 2023-03-07 DIAGNOSIS — M810 Age-related osteoporosis without current pathological fracture: Secondary | ICD-10-CM | POA: Diagnosis not present

## 2023-03-07 DIAGNOSIS — I5030 Unspecified diastolic (congestive) heart failure: Secondary | ICD-10-CM | POA: Diagnosis not present

## 2023-03-07 DIAGNOSIS — E1142 Type 2 diabetes mellitus with diabetic polyneuropathy: Secondary | ICD-10-CM | POA: Diagnosis not present

## 2023-03-07 DIAGNOSIS — Z5181 Encounter for therapeutic drug level monitoring: Secondary | ICD-10-CM | POA: Diagnosis not present

## 2023-03-07 DIAGNOSIS — I10 Essential (primary) hypertension: Secondary | ICD-10-CM | POA: Diagnosis not present

## 2023-03-26 ENCOUNTER — Encounter: Payer: Self-pay | Admitting: Cardiology

## 2023-03-26 DIAGNOSIS — I5030 Unspecified diastolic (congestive) heart failure: Secondary | ICD-10-CM

## 2023-03-26 DIAGNOSIS — Z79899 Other long term (current) drug therapy: Secondary | ICD-10-CM

## 2023-03-27 ENCOUNTER — Encounter: Payer: Self-pay | Admitting: Oncology

## 2023-03-27 ENCOUNTER — Ambulatory Visit: Payer: Medicare PPO | Attending: Cardiology

## 2023-03-27 DIAGNOSIS — I5030 Unspecified diastolic (congestive) heart failure: Secondary | ICD-10-CM | POA: Diagnosis not present

## 2023-03-27 DIAGNOSIS — Z79899 Other long term (current) drug therapy: Secondary | ICD-10-CM

## 2023-03-27 LAB — BASIC METABOLIC PANEL
BUN/Creatinine Ratio: 24 (ref 12–28)
BUN: 23 mg/dL (ref 8–27)
CO2: 31 mmol/L — ABNORMAL HIGH (ref 20–29)
Calcium: 9.5 mg/dL (ref 8.7–10.3)
Chloride: 100 mmol/L (ref 96–106)
Creatinine, Ser: 0.95 mg/dL (ref 0.57–1.00)
Glucose: 77 mg/dL (ref 70–99)
Potassium: 4.6 mmol/L (ref 3.5–5.2)
Sodium: 143 mmol/L (ref 134–144)
eGFR: 60 mL/min/{1.73_m2} (ref >59)

## 2023-03-30 ENCOUNTER — Other Ambulatory Visit: Payer: Self-pay | Admitting: Cardiology

## 2023-03-30 ENCOUNTER — Telehealth: Payer: Self-pay | Admitting: *Deleted

## 2023-03-30 MED ORDER — FUROSEMIDE 40 MG PO TABS: 40.0000 mg | ORAL_TABLET | Freq: Every day | ORAL | 3 refills | Status: DC

## 2023-03-30 NOTE — Telephone Encounter (Signed)
Spoke with pt regarding her recent wts.  They have been slowly creeping up over the past few weeks.  Was 133 lbs abd slowly increased to  138 lbs.  This AM it was 136.6 lbs.  Recent lab stable kidney function and K+ level.  Pt will take Furosemide 40 mg BID through the weekend and continue to monitor her wt.  She will call back on Monday if no improvement.

## 2023-04-02 ENCOUNTER — Encounter: Payer: Self-pay | Admitting: Cardiology

## 2023-04-03 NOTE — Progress Notes (Signed)
Seen by patient [name] on 03/28/2023 10:18 AM Pt has reviewed results and Dr Anne Fu comments via MyChart

## 2023-04-20 ENCOUNTER — Ambulatory Visit (INDEPENDENT_AMBULATORY_CARE_PROVIDER_SITE_OTHER): Payer: Medicare PPO | Admitting: Podiatry

## 2023-04-20 DIAGNOSIS — M79674 Pain in right toe(s): Secondary | ICD-10-CM

## 2023-04-20 DIAGNOSIS — B351 Tinea unguium: Secondary | ICD-10-CM | POA: Diagnosis not present

## 2023-04-20 DIAGNOSIS — E1151 Type 2 diabetes mellitus with diabetic peripheral angiopathy without gangrene: Secondary | ICD-10-CM

## 2023-04-20 DIAGNOSIS — L84 Corns and callosities: Secondary | ICD-10-CM | POA: Diagnosis not present

## 2023-04-20 DIAGNOSIS — M79675 Pain in left toe(s): Secondary | ICD-10-CM | POA: Diagnosis not present

## 2023-04-20 NOTE — Progress Notes (Unsigned)
Subjective:  Patient ID: [name], female    DOB: [birth year],  MRN: [id number]  [name] presents to clinic today for:  Chief Complaint  Patient presents with   Diabetes    [facility] BS - 123  A1C - 6.7  . Patient notes nails are thick and elongated, causing pain in shoe gear when ambulating.  She also has painful calluses near the great toe joint bilateral  PCP is Emilio Aspen, MD. last seen 04/19/2023  Past Medical History:  Diagnosis Date   Acute GI bleeding 05/13/2015   Anxiety    Arthritis    Asthma    Atrial fibrillation (HCC) 10/29/2017   Bakers cyst, left    Chronic kidney disease    stage 3   Constipation    Diabetes mellitus    type 2   Dysrhythmia    GERD (gastroesophageal reflux disease)    Heart murmur    History of cellulitis    History of dizziness    History of hiatal hernia    tiny 06/15/2016   Hypertension    Iron deficiency anemia    Peripheral vascular disease (HCC)    PONV (postoperative nausea and vomiting)    Pulmonary nodule     Allergies  Allergen Reactions   Vasotec Shortness Of Breath and Other (See Comments)    Wheezing, also   Atorvastatin Other (See Comments)    Leg cramps    Codeine Nausea And Vomiting   Cymbalta [Duloxetine Hcl] Itching   Lansoprazole Itching, Swelling, Rash and Other (See Comments)    Redness, swelling of mouth. During 07/12/20-07/15/20 pt tolerated IV protonix infusion with no reactions.   Morphine And Codeine Itching   Sulfate Itching   Amitriptyline Hcl Other (See Comments)     sleepwalking   Escitalopram Oxalate Itching   Hydrocodone Itching   Iron Other (See Comments)    constipated   Sertraline Hcl Itching   Tramadol Itching   Adhesive [Tape] Rash   Allevyn Adhesive [Wound Dressings] Rash   Scopolamine Rash    Review of Systems: Negative except as noted in the HPI.  Objective:  There were no vitals filed for this visit.  CHRYSANTHE [name] is a pleasant 82 y.o. female  in NAD. AAO x 3.  Vascular Examination: Patient has palpable DP pulse, absent PT pulse bilateral.  Delayed capillary refill bilateral toes.  Sparse digital hair bilateral.  Proximal to distal cooling WNL bilateral.    Dermatological Examination: Interspaces are clear with no open lesions noted bilateral.  Skin is shiny and atrophic bilateral.  Nails are 3-52mm thick, with yellowish/brown discoloration, subungual debris and distal onycholysis x10.  There is pain with compression of nails x10.  There are hyperkeratotic lesions noted plantar medial aspect of first MPJ bilateral.  Patient qualifies for at-risk foot care because of diabetes with PVD (Q8 findings).  Assessment/Plan: 1. Pain due to onychomycosis of toenails of both feet   2. Type II diabetes mellitus with peripheral circulatory disorder (HCC)   3. Callus of foot    Mycotic nails x10 were sharply debrided with sterile nail nippers and power debriding burr to decrease bulk and length.  Hyperkeratotic lesions x 2 were shaved with #312 blade.   Return in about 3 months (around 07/21/2023) for [facility].   Clerance Lav, DPM, FACFAS [facility]     [address]  [place], [place] [address]                Office [phone]  Fax [phone]

## 2023-04-25 DIAGNOSIS — E1142 Type 2 diabetes mellitus with diabetic polyneuropathy: Secondary | ICD-10-CM | POA: Diagnosis not present

## 2023-04-26 ENCOUNTER — Ambulatory Visit: Payer: Medicare PPO | Admitting: Podiatry

## 2023-05-25 ENCOUNTER — Telehealth: Payer: Self-pay

## 2023-05-25 ENCOUNTER — Encounter: Payer: Self-pay | Admitting: Cardiology

## 2023-05-25 NOTE — Telephone Encounter (Signed)
Patient writing in on Mychart to report she has been having episodes of being "tired and my heart seems to just take off at times ". She states she has had 4 episodes lasting about 5 mins over the last 2 weeks. She says her HR during these episodes has gotten as high as 110. She states over the past few weeks she has noticed she is often hot but states "it's not like a hot flash and when I check my blood sugar, it's in the low 100's." She reports her HR at the time of the call is 81. She endorses taking he r100 mg of Toprol BID as ordered. She states she is concerned she is back in afib. She has appt w/ T. Asa Lente in November but scheduled her for 06/05/23 at her request.

## 2023-05-29 ENCOUNTER — Encounter: Payer: Self-pay | Admitting: Oncology

## 2023-06-05 ENCOUNTER — Encounter: Payer: Self-pay | Admitting: Physician Assistant

## 2023-06-05 ENCOUNTER — Ambulatory Visit: Payer: Medicare PPO | Attending: Physician Assistant | Admitting: Physician Assistant

## 2023-06-05 VITALS — BP 116/68 | HR 67 | Ht 64.0 in | Wt 136.0 lb

## 2023-06-05 DIAGNOSIS — Z8719 Personal history of other diseases of the digestive system: Secondary | ICD-10-CM | POA: Diagnosis not present

## 2023-06-05 DIAGNOSIS — I480 Paroxysmal atrial fibrillation: Secondary | ICD-10-CM | POA: Diagnosis not present

## 2023-06-05 DIAGNOSIS — Z79899 Other long term (current) drug therapy: Secondary | ICD-10-CM | POA: Diagnosis not present

## 2023-06-05 DIAGNOSIS — Z95818 Presence of other cardiac implants and grafts: Secondary | ICD-10-CM

## 2023-06-05 DIAGNOSIS — R0602 Shortness of breath: Secondary | ICD-10-CM | POA: Diagnosis not present

## 2023-06-05 DIAGNOSIS — I5030 Unspecified diastolic (congestive) heart failure: Secondary | ICD-10-CM

## 2023-06-05 DIAGNOSIS — R5383 Other fatigue: Secondary | ICD-10-CM | POA: Diagnosis not present

## 2023-06-05 NOTE — Progress Notes (Signed)
Cardiology Office Note:  .   Date:  06/05/2023  ID:  [name], DOB [birth year], MRN [id number] PCP: Emilio Aspen, MD  [facility] Providers Cardiologist:  Donato Schultz, MD Electrophysiologist:  Lanier Prude, MD {  History of Present Illness: .   [name] is a 82 y.o. female with a past medical history of PAF, history of GI bleeds, and status post watchman left atrial appendage closure device who is here for routine follow-up appointment.  Patient was doing very well when she was last seen by Otilio Saber Denied chest pain, palpitations, dyspnea, PND, orthopnea, nausea, vomiting, dizziness, syncope, edema, weight gain, early satiety.  Today, she tells me that every morning she is nauseous, and really tired. This has been going on for a couple of months. She has gained weight 3 lbs. Occasionally,  some swelling in her legs although euvolemic on exam today. She takes her lasix everyday. Takes two if she gains 2 lbs or more, once a week. She has lost over 30 lbs in the past and trys to watch her weight closely. She tells me she has gained about 3 lbs and watches her salt closely. Back in rate-controlled Aafib today.  Reports no chest pain, pressure, or tightness. No  orthopnea, PND. Reports no palpitations.    ROS: Pertinent ROS in HPI  Studies Reviewed: .       Echo 11/18/2020 IMPRESSIONS     1. Left ventricular ejection fraction, by estimation, is 55 to 60%. The  left ventricle has normal function.   2. Right ventricular systolic function is normal. The right ventricular  size is normal. There is mildly elevated pulmonary artery systolic  pressure.   3. A small pericardial effusion is present.   4. Right atrial size was moderately dilated.   5. The mitral valve is normal in structure. Mild mitral valve  regurgitation.   6. The aortic valve is tricuspid. Aortic valve regurgitation is not  visualized.   7. Tricuspid valve regurgitation is moderate.    8. S/p Watchman LAA occlusion device, appears well sealed. No thrombus  seen. Left atrial size was moderately dilated.   FINDINGS   Left Ventricle: Left ventricular ejection fraction, by estimation, is 55  to 60%. The left ventricle has normal function. The left ventricular  internal cavity size was normal in size.   Right Ventricle: The right ventricular size is normal. No increase in  right ventricular wall thickness. Right ventricular systolic function is  normal. There is mildly elevated pulmonary artery systolic pressure.   Left Atrium: S/p Watchman LAA occlusion device, appears well sealed. No  thrombus seen. Left atrial size was moderately dilated. No left  atrial/left atrial appendage thrombus was detected.   Right Atrium: Right atrial size was moderately dilated.   Pericardium: A small pericardial effusion is present.   Mitral Valve: The mitral valve is normal in structure. Mild mitral valve  regurgitation.   Tricuspid Valve: The tricuspid valve is normal in structure. Tricuspid  valve regurgitation is moderate.   Aortic Valve: The aortic valve is tricuspid. Aortic valve regurgitation is  not visualized.   Pulmonic Valve: The pulmonic valve was grossly normal. Pulmonic valve  regurgitation is not visualized.   Aorta: The aortic root and ascending aorta are structurally normal, with  no evidence of dilitation.   IAS/Shunts: No atrial level shunt detected by color flow Doppler.  Risk Assessment/Calculations:    CHA2DS2-VASc Score = 7   This indicates a 11.2%  annual risk of stroke. The patient's score is based upon: CHF History: 1 HTN History: 1 Diabetes History: 1 Stroke History: 0 Vascular Disease History: 1 Age Score: 2 Gender Score: 1          Physical Exam:   VS:  BP 116/68   Pulse 67   Ht 5\' 4"  (1.626 m)   Wt 136 lb (61.7 kg)   SpO2 95%   BMI 23.34 kg/m    Wt Readings from Last 3 Encounters:  06/05/23 136 lb (61.7 kg)  02/22/23 133 lb 3.2 oz  (60.4 kg)  06/28/22 134 lb (60.8 kg)    GEN: Well nourished, well developed in no acute distress NECK: No JVD; No carotid bruits CARDIAC: IRIR, no murmurs, rubs, gallops RESPIRATORY:  Clear to auscultation without rales, wheezing or rhonchi  ABDOMEN: Soft, non-tender, non-distended EXTREMITIES:  No edema; No deformity   ASSESSMENT AND PLAN: .   1.  PAF -she is back in rate controlled Afib -she cannot tolerate blood thinners due to GI bleed -we will get her EP follow-up to discuss options, she would prefer to be in NSR but protected with watchman procedure  2.  Status post watchman -no complications, on ASA 81mg  daily  3.  HFpEF   -fluid wise she looks okay.  -SOB has gotten worse the last few months -indigestion but not chest pains -we will update an echo and get some labs today -she does endorse some fatigue and nausea in the morning.  -switch her cozaar and norvasc to nighttime to cut back on pill overload in the morning.  -advised to take her pills with food    Dispo: She will follow-up in 1-2 with APP in 6 months with Dr. Anne Fu  Signed, Sharlene Dory, PA-C

## 2023-06-05 NOTE — Patient Instructions (Signed)
Medication Instructions:  Switch amlodipine (Norvasc) and losartan (Cozaar) to taking at night Take all pills with food *If you need a refill on your cardiac medications before your next appointment, please call your pharmacy*  Lab Work: BMET, MAG, BNP, CBC, TSH-TODAY If you have labs (blood work) drawn today and your tests are completely normal, you will receive your results only by: MyChart Message (if you have MyChart) OR A paper copy in the mail If you have any lab test that is abnormal or we need to change your treatment, we will call you to review the results.  Testing/Procedures: Your physician has requested that you have an echocardiogram. Echocardiography is a painless test that uses sound waves to create images of your heart. It provides your doctor with information about the size and shape of your heart and how well your heart's chambers and valves are working. This procedure takes approximately one hour. There are no restrictions for this procedure. Please do NOT wear cologne, perfume, aftershave, or lotions (deodorant is allowed). Please arrive 15 minutes prior to your appointment time.    Follow-Up: At [facility], you and your health needs are our priority.  As part of our continuing mission to provide you with exceptional heart care, we have created designated Provider Care Teams.  These Care Teams include your primary Cardiologist (physician) and Advanced Practice Providers (APPs -  Physician Assistants and Nurse Practitioners) who all work together to provide you with the care you need, when you need it.   Your next appointment:   1 month(s) or next available  Provider:   Steffanie Dunn, MD    6 months with Dr Anne Fu  Other Instructions 1.Check your blood pressure daily, 1 hr after morning medications for 2 weeks, keep a log and send [place] the readings through mychart at the end of the 2 weeks.  2.Weigh every morning after using the restroom, before breakfast  and call and let us know if you have a weight gain of 2 lbs or more overnight or 5 lbs or more in a week.   Heart-Healthy Eating Plan Many factors influence your heart health, including eating and exercise habits. Heart health is also called coronary health. Coronary risk increases with abnormal blood fat (lipid) levels. A heart-healthy eating plan includes limiting unhealthy fats, increasing healthy fats, limiting salt (sodium) intake, and making other diet and lifestyle changes. What is my plan? Your health care provider may recommend that: You limit your fat intake to _________% or less of your total calories each day. You limit your saturated fat intake to _________% or less of your total calories each day. You limit the amount of cholesterol in your diet to less than _________ mg per day. You limit the amount of sodium in your diet to less than _________ mg per day. What are tips for following this plan? Cooking Cook foods using methods other than frying. Baking, boiling, grilling, and broiling are all good options. Other ways to reduce fat include: Removing the skin from poultry. Removing all visible fats from meats. Steaming vegetables in water or broth. Meal planning  At meals, imagine dividing your plate into fourths: Fill one-half of your plate with vegetables and green salads. Fill one-fourth of your plate with whole grains. Fill one-fourth of your plate with lean protein foods. Eat 2-4 cups of vegetables per day. One cup of vegetables equals 1 cup (91 g) broccoli or cauliflower florets, 2 medium carrots, 1 large bell pepper, 1 large sweet potato,  1 large tomato, 1 medium white potato, 2 cups (150 g) raw leafy greens. Eat 1-2 cups of fruit per day. One cup of fruit equals 1 small apple, 1 large banana, 1 cup (237 g) mixed fruit, 1 large orange,  cup (82 g) dried fruit, 1 cup (240 mL) 100% fruit juice. Eat more foods that contain soluble fiber. Examples include apples, broccoli,  carrots, beans, peas, and barley. Aim to get 25-30 g of fiber per day. Increase your consumption of legumes, nuts, and seeds to 4-5 servings per week. One serving of dried beans or legumes equals  cup (90 g) cooked, 1 serving of nuts is  oz (12 almonds, 24 pistachios, or 7 walnut halves), and 1 serving of seeds equals  oz (8 g). Fats Choose healthy fats more often. Choose monounsaturated and polyunsaturated fats, such as olive and canola oils, avocado oil, flaxseeds, walnuts, almonds, and seeds. Eat more omega-3 fats. Choose salmon, mackerel, sardines, tuna, flaxseed oil, and ground flaxseeds. Aim to eat fish at least 2 times each week. Check food labels carefully to identify foods with trans fats or high amounts of saturated fat. Limit saturated fats. These are found in animal products, such as meats, butter, and cream. Plant sources of saturated fats include palm oil, palm kernel oil, and coconut oil. Avoid foods with partially hydrogenated oils in them. These contain trans fats. Examples are stick margarine, some tub margarines, cookies, crackers, and other baked goods. Avoid fried foods. General information Eat more home-cooked food and less restaurant, buffet, and fast food. Limit or avoid alcohol. Limit foods that are high in added sugar and simple starches such as foods made using white refined flour (white breads, pastries, sweets). Lose weight if you are overweight. Losing just 5-10% of your body weight can help your overall health and prevent diseases such as diabetes and heart disease. Monitor your sodium intake, especially if you have high blood pressure. Talk with your health care provider about your sodium intake. Try to incorporate more vegetarian meals weekly. What foods should I eat? Fruits All fresh, canned (in natural juice), or frozen fruits. Vegetables Fresh or frozen vegetables (raw, steamed, roasted, or grilled). Green salads. Grains Most grains. Choose whole wheat and  whole grains most of the time. Rice and pasta, including brown rice and pastas made with whole wheat. Meats and other proteins Lean, well-trimmed beef, veal, pork, and lamb. Chicken and [place] without skin. All fish and shellfish. Wild duck, rabbit, pheasant, and venison. Egg whites or low-cholesterol egg substitutes. Dried beans, peas, lentils, and tofu. Seeds and most nuts. Dairy Low-fat or nonfat cheeses, including ricotta and mozzarella. Skim or 1% milk (liquid, powdered, or evaporated). Buttermilk made with low-fat milk. Nonfat or low-fat yogurt. Fats and oils Non-hydrogenated (trans-free) margarines. Vegetable oils, including soybean, sesame, sunflower, olive, avocado, peanut, safflower, corn, canola, and cottonseed. Salad dressings or mayonnaise made with a vegetable oil. Beverages Water (mineral or sparkling). Coffee and tea. Unsweetened ice tea. Diet beverages. Sweets and desserts Sherbet, gelatin, and fruit ice. Small amounts of dark chocolate. Limit all sweets and desserts. Seasonings and condiments All seasonings and condiments. The items listed above may not be a complete list of foods and beverages you can eat. Contact a dietitian for more options. What foods should I avoid? Fruits Canned fruit in heavy syrup. Fruit in cream or butter sauce. Fried fruit. Limit coconut. Vegetables Vegetables cooked in cheese, cream, or butter sauce. Fried vegetables. Grains Breads made with saturated or trans fats, oils, or  whole milk. Croissants. Sweet rolls. Donuts. High-fat crackers, such as cheese crackers and chips. Meats and other proteins Fatty meats, such as hot dogs, ribs, sausage, bacon, rib-eye roast or steak. High-fat deli meats, such as salami and bologna. Caviar. Domestic duck and goose. Organ meats, such as liver. Dairy Cream, sour cream, cream cheese, and creamed cottage cheese. Whole-milk cheeses. Whole or 2% milk (liquid, evaporated, or condensed). Whole buttermilk. Cream sauce  or high-fat cheese sauce. Whole-milk yogurt. Fats and oils Meat fat, or shortening. Cocoa butter, hydrogenated oils, palm oil, coconut oil, palm kernel oil. Solid fats and shortenings, including bacon fat, salt pork, lard, and butter. Nondairy cream substitutes. Salad dressings with cheese or sour cream. Beverages Regular sodas and any drinks with added sugar. Sweets and desserts Frosting. Pudding. Cookies. Cakes. Pies. Milk chocolate or white chocolate. Buttered syrups. Full-fat ice cream or ice cream drinks. The items listed above may not be a complete list of foods and beverages to avoid. Contact a dietitian for more information. Summary Heart-healthy meal planning includes limiting unhealthy fats, increasing healthy fats, limiting salt (sodium) intake and making other diet and lifestyle changes. Lose weight if you are overweight. Losing just 5-10% of your body weight can help your overall health and prevent diseases such as diabetes and heart disease. Focus on eating a balance of foods, including fruits and vegetables, low-fat or nonfat dairy, lean protein, nuts and legumes, whole grains, and heart-healthy oils and fats. This information is not intended to replace advice given to you by your health care provider. Make sure you discuss any questions you have with your health care provider. Document Revised: 09/19/2021 Document Reviewed: 09/19/2021 Elsevier Patient Education  2024 [employer] Low-Sodium Eating Plan Salt (sodium) helps you keep a healthy balance of fluids in your body. Too much sodium can raise your blood pressure. It can also cause fluid and waste to be held in your body. Your health care provider or dietitian may recommend a low-sodium eating plan if you have high blood pressure (hypertension), kidney disease, liver disease, or heart failure. Eating less sodium can help lower your blood pressure and reduce swelling. It can also protect your heart, liver, and kidneys. What are  tips for following this plan? Reading food labels  Check food labels for the amount of sodium per serving. If you eat more than one serving, you must multiply the listed amount by the number of servings. Choose foods with less than 140 milligrams (mg) of sodium per serving. Avoid foods with 300 mg of sodium or more per serving. Always check how much sodium is in a product, even if the label says "unsalted" or "no salt added." Shopping  Buy products labeled as "low-sodium" or "no salt added." Buy fresh foods. Avoid canned foods and pre-made or frozen meals. Avoid canned, cured, or processed meats. Buy breads that have less than 80 mg of sodium per slice. Cooking  Eat more home-cooked food. Try to eat less restaurant, buffet, and fast food. Try not to add salt when you cook. Use salt-free seasonings or herbs instead of table salt or sea salt. Check with your provider or pharmacist before using salt substitutes. Cook with plant-based oils, such as canola, sunflower, or olive oil. Meal planning When eating at a restaurant, ask if your food can be made with less salt or no salt. Avoid dishes labeled as brined, pickled, cured, or smoked. Avoid dishes made with soy sauce, miso, or teriyaki sauce. Avoid foods that have monosodium glutamate (MSG) in  them. MSG may be added to some restaurant food, sauces, soups, bouillon, and canned foods. Make meals that can be grilled, baked, poached, roasted, or steamed. These are often made with less sodium. General information Try to limit your sodium intake to 1,500-2,300 mg each day, or the amount told by your provider. What foods should I eat? Fruits Fresh, frozen, or canned fruit. Fruit juice. Vegetables Fresh or frozen vegetables. "No salt added" canned vegetables. "No salt added" tomato sauce and paste. Low-sodium or reduced-sodium tomato and vegetable juice. Grains Low-sodium cereals, such as oats, puffed wheat and rice, and shredded wheat.  Low-sodium crackers. Unsalted rice. Unsalted pasta. Low-sodium bread. Whole grain breads and whole grain pasta. Meats and other proteins Fresh or frozen meat, poultry, seafood, and fish. These should have no added salt. Low-sodium canned tuna and salmon. Unsalted nuts. Dried peas, beans, and lentils without added salt. Unsalted canned beans. Eggs. Unsalted nut butters. Dairy Milk. Soy milk. Cheese that is naturally low in sodium, such as ricotta cheese, fresh mozzarella, or Swiss cheese. Low-sodium or reduced-sodium cheese. Cream cheese. Yogurt. Seasonings and condiments Fresh and dried herbs and spices. Salt-free seasonings. Low-sodium mustard and ketchup. Sodium-free salad dressing. Sodium-free light mayonnaise. Fresh or refrigerated horseradish. Lemon juice. Vinegar. Other foods Homemade, reduced-sodium, or low-sodium soups. Unsalted popcorn and pretzels. Low-salt or salt-free chips. The items listed above may not be all the foods and drinks you can have. Talk to a dietitian to learn more. What foods should I avoid? Vegetables Sauerkraut, pickled vegetables, and relishes. Olives. [place] fries. Onion rings. Regular canned vegetables, except low-sodium or reduced-sodium items. Regular canned tomato sauce and paste. Regular tomato and vegetable juice. Frozen vegetables in sauces. Grains Instant hot cereals. Bread stuffing, pancake, and biscuit mixes. Croutons. Seasoned rice or pasta mixes. Noodle soup cups. Boxed or frozen macaroni and cheese. Regular salted crackers. Self-rising flour. Meats and other proteins Meat or fish that is salted, canned, smoked, spiced, or pickled. Precooked or cured meat, such as sausages or meat loaves. Tomasa Blase. Ham. Pepperoni. Hot dogs. Corned beef. Chipped beef. Salt pork. Jerky. Pickled herring, anchovies, and sardines. Regular canned tuna. Salted nuts. Dairy Processed cheese and cheese spreads. Hard cheeses. Cheese curds. Blue cheese. Feta cheese. String cheese.  Regular cottage cheese. Buttermilk. Canned milk. Fats and oils Salted butter. Regular margarine. Ghee. Bacon fat. Seasonings and condiments Onion salt, garlic salt, seasoned salt, table salt, and sea salt. Canned and packaged gravies. Worcestershire sauce. Tartar sauce. Barbecue sauce. Teriyaki sauce. Soy sauce, including reduced-sodium soy sauce. Steak sauce. Fish sauce. Oyster sauce. Cocktail sauce. Horseradish that you find on the shelf. Regular ketchup and mustard. Meat flavorings and tenderizers. Bouillon cubes. Hot sauce. Pre-made or packaged marinades. Pre-made or packaged taco seasonings. Relishes. Regular salad dressings. Salsa. Other foods Salted popcorn and pretzels. Corn chips and puffs. Potato and tortilla chips. Canned or dried soups. Pizza. Frozen entrees and pot pies. The items listed above may not be all the foods and drinks you should avoid. Talk to a dietitian to learn more. This information is not intended to replace advice given to you by your health care provider. Make sure you discuss any questions you have with your health care provider. Document Revised: 08/31/2022 Document Reviewed: 08/31/2022 Elsevier Patient Education  2024 [employer].

## 2023-06-06 ENCOUNTER — Encounter: Payer: Self-pay | Admitting: Cardiology

## 2023-06-06 LAB — MAGNESIUM: Magnesium: 2.6 mg/dL — ABNORMAL HIGH (ref 1.6–2.3)

## 2023-06-06 LAB — BASIC METABOLIC PANEL
BUN/Creatinine Ratio: 23 (ref 12–28)
BUN: 27 mg/dL (ref 8–27)
CO2: 29 mmol/L (ref 20–29)
Calcium: 10 mg/dL (ref 8.7–10.3)
Chloride: 99 mmol/L (ref 96–106)
Creatinine, Ser: 1.16 mg/dL — ABNORMAL HIGH (ref 0.57–1.00)
Glucose: 65 mg/dL — ABNORMAL LOW (ref 70–99)
Potassium: 4.3 mmol/L (ref 3.5–5.2)
Sodium: 143 mmol/L (ref 134–144)
eGFR: 47 mL/min/{1.73_m2} — ABNORMAL LOW (ref >59)

## 2023-06-06 LAB — CBC
Hematocrit: 52.1 % — ABNORMAL HIGH (ref 34.0–46.6)
Hemoglobin: 16.7 g/dL — ABNORMAL HIGH (ref 11.1–15.9)
MCH: 30.4 pg (ref 26.6–33.0)
MCHC: 32.1 g/dL (ref 31.5–35.7)
MCV: 95 fL (ref 79–97)
Platelets: 230 10*3/uL (ref 150–450)
RBC: 5.5 x10E6/uL — ABNORMAL HIGH (ref 3.77–5.28)
RDW: 12.7 % (ref 11.7–15.4)
WBC: 7.3 10*3/uL (ref 3.4–10.8)

## 2023-06-06 LAB — TSH: TSH: 0.925 u[IU]/mL (ref 0.450–4.500)

## 2023-06-06 LAB — PRO B NATRIURETIC PEPTIDE: NT-Pro BNP: 1103 pg/mL — ABNORMAL HIGH (ref 0–738)

## 2023-06-08 ENCOUNTER — Telehealth: Payer: Self-pay | Admitting: *Deleted

## 2023-06-08 DIAGNOSIS — Z79899 Other long term (current) drug therapy: Secondary | ICD-10-CM

## 2023-06-08 NOTE — Telephone Encounter (Signed)
-----   Message from [facility] Lindsey W sent at 06/08/2023  2:12 PM EDT -----  ----- Message ----- From: Sharlene Dory, PA-C Sent: 06/08/2023   1:13 PM EDT To: [address] Triage  Ms. Janak.   Your kidneys look stable.  Potassium is at goal.  Your magnesium is slightly elevated.  Are you taking a supplement?  If so please discontinue and we can repeat a magnesium in a week or 2.  Your fluid status number is elevated.  Have you experience swelling in your legs or shortness of breath?  If so, would like you to take an extra 40 mg of Lasix x 3 days and recheck a BMP in a week.  Thanks.  Sharlene Dory, PA-C

## 2023-06-13 ENCOUNTER — Ambulatory Visit: Payer: Medicare PPO | Attending: Internal Medicine

## 2023-06-14 ENCOUNTER — Ambulatory Visit: Payer: Medicare PPO | Attending: Physician Assistant

## 2023-06-14 DIAGNOSIS — Z03818 Encounter for observation for suspected exposure to other biological agents ruled out: Secondary | ICD-10-CM | POA: Diagnosis not present

## 2023-06-14 DIAGNOSIS — I110 Hypertensive heart disease with heart failure: Secondary | ICD-10-CM | POA: Diagnosis not present

## 2023-06-14 DIAGNOSIS — I480 Paroxysmal atrial fibrillation: Secondary | ICD-10-CM | POA: Diagnosis not present

## 2023-06-14 DIAGNOSIS — Z79899 Other long term (current) drug therapy: Secondary | ICD-10-CM | POA: Diagnosis not present

## 2023-06-14 DIAGNOSIS — J069 Acute upper respiratory infection, unspecified: Secondary | ICD-10-CM | POA: Diagnosis not present

## 2023-06-14 DIAGNOSIS — I5032 Chronic diastolic (congestive) heart failure: Secondary | ICD-10-CM | POA: Diagnosis not present

## 2023-06-14 DIAGNOSIS — I10 Essential (primary) hypertension: Secondary | ICD-10-CM | POA: Diagnosis not present

## 2023-06-15 LAB — MAGNESIUM: Magnesium: 2.3 mg/dL (ref 1.6–2.3)

## 2023-06-15 LAB — BASIC METABOLIC PANEL
BUN/Creatinine Ratio: 19 (ref 12–28)
BUN: 18 mg/dL (ref 8–27)
CO2: 30 mmol/L — ABNORMAL HIGH (ref 20–29)
Calcium: 9.8 mg/dL (ref 8.7–10.3)
Chloride: 97 mmol/L (ref 96–106)
Creatinine, Ser: 0.97 mg/dL (ref 0.57–1.00)
Glucose: 89 mg/dL (ref 70–99)
Potassium: 4.4 mmol/L (ref 3.5–5.2)
Sodium: 144 mmol/L (ref 134–144)
eGFR: 58 mL/min/{1.73_m2} — ABNORMAL LOW (ref >59)

## 2023-06-16 ENCOUNTER — Other Ambulatory Visit: Payer: Self-pay | Admitting: Cardiology

## 2023-06-23 ENCOUNTER — Other Ambulatory Visit: Payer: Self-pay | Admitting: Cardiology

## 2023-06-25 ENCOUNTER — Ambulatory Visit: Payer: Medicare PPO | Admitting: Physician Assistant

## 2023-06-25 MED ORDER — FUROSEMIDE 40 MG PO TABS: 40.0000 mg | ORAL_TABLET | Freq: Every day | ORAL | 3 refills | Status: DC

## 2023-06-27 ENCOUNTER — Ambulatory Visit (HOSPITAL_BASED_OUTPATIENT_CLINIC_OR_DEPARTMENT_OTHER): Payer: Medicare PPO

## 2023-06-27 DIAGNOSIS — R5383 Other fatigue: Secondary | ICD-10-CM | POA: Insufficient documentation

## 2023-06-27 DIAGNOSIS — I10 Essential (primary) hypertension: Secondary | ICD-10-CM | POA: Diagnosis not present

## 2023-06-27 DIAGNOSIS — E1122 Type 2 diabetes mellitus with diabetic chronic kidney disease: Secondary | ICD-10-CM | POA: Diagnosis present

## 2023-06-27 DIAGNOSIS — R0602 Shortness of breath: Secondary | ICD-10-CM | POA: Insufficient documentation

## 2023-06-27 DIAGNOSIS — Y92009 Unspecified place in unspecified non-institutional (private) residence as the place of occurrence of the external cause: Secondary | ICD-10-CM | POA: Diagnosis not present

## 2023-06-27 DIAGNOSIS — I2510 Atherosclerotic heart disease of native coronary artery without angina pectoris: Secondary | ICD-10-CM | POA: Diagnosis not present

## 2023-06-27 DIAGNOSIS — S72002A Fracture of unspecified part of neck of left femur, initial encounter for closed fracture: Secondary | ICD-10-CM | POA: Diagnosis present

## 2023-06-27 DIAGNOSIS — I130 Hypertensive heart and chronic kidney disease with heart failure and stage 1 through stage 4 chronic kidney disease, or unspecified chronic kidney disease: Secondary | ICD-10-CM | POA: Diagnosis present

## 2023-06-27 DIAGNOSIS — I5032 Chronic diastolic (congestive) heart failure: Secondary | ICD-10-CM | POA: Diagnosis present

## 2023-06-27 DIAGNOSIS — N1831 Chronic kidney disease, stage 3a: Secondary | ICD-10-CM | POA: Diagnosis present

## 2023-06-27 DIAGNOSIS — N183 Chronic kidney disease, stage 3 unspecified: Secondary | ICD-10-CM | POA: Diagnosis not present

## 2023-06-27 DIAGNOSIS — I480 Paroxysmal atrial fibrillation: Secondary | ICD-10-CM | POA: Diagnosis present

## 2023-06-27 DIAGNOSIS — Z888 Allergy status to other drugs, medicaments and biological substances status: Secondary | ICD-10-CM | POA: Diagnosis not present

## 2023-06-27 DIAGNOSIS — S72142A Displaced intertrochanteric fracture of left femur, initial encounter for closed fracture: Secondary | ICD-10-CM | POA: Diagnosis present

## 2023-06-27 DIAGNOSIS — S7292XD Unspecified fracture of left femur, subsequent encounter for closed fracture with routine healing: Secondary | ICD-10-CM | POA: Diagnosis not present

## 2023-06-27 DIAGNOSIS — M25462 Effusion, left knee: Secondary | ICD-10-CM | POA: Diagnosis not present

## 2023-06-27 DIAGNOSIS — Z885 Allergy status to narcotic agent status: Secondary | ICD-10-CM | POA: Diagnosis not present

## 2023-06-27 DIAGNOSIS — Z90710 Acquired absence of both cervix and uterus: Secondary | ICD-10-CM | POA: Diagnosis not present

## 2023-06-27 DIAGNOSIS — E1165 Type 2 diabetes mellitus with hyperglycemia: Secondary | ICD-10-CM | POA: Diagnosis present

## 2023-06-27 DIAGNOSIS — E1142 Type 2 diabetes mellitus with diabetic polyneuropathy: Secondary | ICD-10-CM | POA: Diagnosis not present

## 2023-06-27 DIAGNOSIS — Z7982 Long term (current) use of aspirin: Secondary | ICD-10-CM | POA: Diagnosis not present

## 2023-06-27 DIAGNOSIS — S72142D Displaced intertrochanteric fracture of left femur, subsequent encounter for closed fracture with routine healing: Secondary | ICD-10-CM | POA: Diagnosis not present

## 2023-06-27 DIAGNOSIS — Z96652 Presence of left artificial knee joint: Secondary | ICD-10-CM | POA: Diagnosis present

## 2023-06-27 DIAGNOSIS — I4891 Unspecified atrial fibrillation: Secondary | ICD-10-CM | POA: Diagnosis not present

## 2023-06-27 DIAGNOSIS — N179 Acute kidney failure, unspecified: Secondary | ICD-10-CM | POA: Diagnosis present

## 2023-06-27 DIAGNOSIS — Z7401 Bed confinement status: Secondary | ICD-10-CM | POA: Diagnosis not present

## 2023-06-27 DIAGNOSIS — E1151 Type 2 diabetes mellitus with diabetic peripheral angiopathy without gangrene: Secondary | ICD-10-CM | POA: Diagnosis present

## 2023-06-27 DIAGNOSIS — Z01818 Encounter for other preprocedural examination: Secondary | ICD-10-CM | POA: Diagnosis not present

## 2023-06-27 DIAGNOSIS — E871 Hypo-osmolality and hyponatremia: Secondary | ICD-10-CM | POA: Diagnosis present

## 2023-06-27 DIAGNOSIS — D72829 Elevated white blood cell count, unspecified: Secondary | ICD-10-CM | POA: Diagnosis present

## 2023-06-27 DIAGNOSIS — Z9682 Presence of neurostimulator: Secondary | ICD-10-CM | POA: Diagnosis not present

## 2023-06-27 DIAGNOSIS — Z794 Long term (current) use of insulin: Secondary | ICD-10-CM | POA: Diagnosis not present

## 2023-06-27 DIAGNOSIS — R600 Localized edema: Secondary | ICD-10-CM | POA: Diagnosis not present

## 2023-06-27 DIAGNOSIS — F339 Major depressive disorder, recurrent, unspecified: Secondary | ICD-10-CM | POA: Diagnosis not present

## 2023-06-27 DIAGNOSIS — W010XXA Fall on same level from slipping, tripping and stumbling without subsequent striking against object, initial encounter: Secondary | ICD-10-CM | POA: Diagnosis present

## 2023-06-27 DIAGNOSIS — D62 Acute posthemorrhagic anemia: Secondary | ICD-10-CM | POA: Diagnosis not present

## 2023-06-27 DIAGNOSIS — D696 Thrombocytopenia, unspecified: Secondary | ICD-10-CM | POA: Diagnosis present

## 2023-06-27 DIAGNOSIS — E785 Hyperlipidemia, unspecified: Secondary | ICD-10-CM | POA: Diagnosis present

## 2023-06-27 DIAGNOSIS — Z90721 Acquired absence of ovaries, unilateral: Secondary | ICD-10-CM | POA: Diagnosis not present

## 2023-06-27 DIAGNOSIS — I5030 Unspecified diastolic (congestive) heart failure: Secondary | ICD-10-CM | POA: Diagnosis not present

## 2023-06-27 DIAGNOSIS — W19XXXA Unspecified fall, initial encounter: Secondary | ICD-10-CM | POA: Diagnosis not present

## 2023-06-27 DIAGNOSIS — Z96651 Presence of right artificial knee joint: Secondary | ICD-10-CM | POA: Diagnosis present

## 2023-06-27 DIAGNOSIS — R262 Difficulty in walking, not elsewhere classified: Secondary | ICD-10-CM | POA: Diagnosis not present

## 2023-06-27 DIAGNOSIS — F32A Depression, unspecified: Secondary | ICD-10-CM | POA: Diagnosis present

## 2023-06-27 LAB — ECHOCARDIOGRAM COMPLETE
Area-P 1/2: 3.89 cm2
S' Lateral: 2.7 cm

## 2023-06-28 ENCOUNTER — Emergency Department (HOSPITAL_COMMUNITY): Payer: Medicare PPO

## 2023-06-28 ENCOUNTER — Other Ambulatory Visit: Payer: Self-pay

## 2023-06-28 ENCOUNTER — Encounter (HOSPITAL_COMMUNITY): Payer: Self-pay

## 2023-06-28 ENCOUNTER — Inpatient Hospital Stay (HOSPITAL_COMMUNITY): Payer: Medicare PPO

## 2023-06-28 ENCOUNTER — Inpatient Hospital Stay (HOSPITAL_COMMUNITY)
Admission: EM | Admit: 2023-06-28 | Discharge: 2023-07-03 | DRG: 481 | Disposition: A | Discharge status: Skilled Nursing Facility | Payer: Medicare PPO | Attending: Internal Medicine | Admitting: Internal Medicine

## 2023-06-28 DIAGNOSIS — E7800 Pure hypercholesterolemia, unspecified: Secondary | ICD-10-CM | POA: Diagnosis present

## 2023-06-28 DIAGNOSIS — E1122 Type 2 diabetes mellitus with diabetic chronic kidney disease: Secondary | ICD-10-CM | POA: Diagnosis present

## 2023-06-28 DIAGNOSIS — E871 Hypo-osmolality and hyponatremia: Secondary | ICD-10-CM | POA: Diagnosis present

## 2023-06-28 DIAGNOSIS — D696 Thrombocytopenia, unspecified: Secondary | ICD-10-CM | POA: Diagnosis present

## 2023-06-28 DIAGNOSIS — Z79899 Other long term (current) drug therapy: Secondary | ICD-10-CM

## 2023-06-28 DIAGNOSIS — N1831 Chronic kidney disease, stage 3a: Secondary | ICD-10-CM | POA: Diagnosis present

## 2023-06-28 DIAGNOSIS — I130 Hypertensive heart and chronic kidney disease with heart failure and stage 1 through stage 4 chronic kidney disease, or unspecified chronic kidney disease: Secondary | ICD-10-CM | POA: Diagnosis present

## 2023-06-28 DIAGNOSIS — Z8249 Family history of ischemic heart disease and other diseases of the circulatory system: Secondary | ICD-10-CM

## 2023-06-28 DIAGNOSIS — N183 Chronic kidney disease, stage 3 unspecified: Secondary | ICD-10-CM | POA: Diagnosis present

## 2023-06-28 DIAGNOSIS — E1165 Type 2 diabetes mellitus with hyperglycemia: Secondary | ICD-10-CM | POA: Diagnosis present

## 2023-06-28 DIAGNOSIS — Z90710 Acquired absence of both cervix and uterus: Secondary | ICD-10-CM

## 2023-06-28 DIAGNOSIS — I5032 Chronic diastolic (congestive) heart failure: Secondary | ICD-10-CM | POA: Diagnosis present

## 2023-06-28 DIAGNOSIS — F339 Major depressive disorder, recurrent, unspecified: Secondary | ICD-10-CM | POA: Diagnosis not present

## 2023-06-28 DIAGNOSIS — Z9682 Presence of neurostimulator: Secondary | ICD-10-CM | POA: Diagnosis not present

## 2023-06-28 DIAGNOSIS — E785 Hyperlipidemia, unspecified: Secondary | ICD-10-CM | POA: Diagnosis present

## 2023-06-28 DIAGNOSIS — R110 Nausea: Secondary | ICD-10-CM | POA: Diagnosis not present

## 2023-06-28 DIAGNOSIS — S72002A Fracture of unspecified part of neck of left femur, initial encounter for closed fracture: Secondary | ICD-10-CM | POA: Diagnosis present

## 2023-06-28 DIAGNOSIS — I10 Essential (primary) hypertension: Secondary | ICD-10-CM | POA: Diagnosis present

## 2023-06-28 DIAGNOSIS — W010XXA Fall on same level from slipping, tripping and stumbling without subsequent striking against object, initial encounter: Secondary | ICD-10-CM | POA: Diagnosis present

## 2023-06-28 DIAGNOSIS — Z794 Long term (current) use of insulin: Secondary | ICD-10-CM | POA: Diagnosis not present

## 2023-06-28 DIAGNOSIS — S72142A Displaced intertrochanteric fracture of left femur, initial encounter for closed fracture: Principal | ICD-10-CM | POA: Diagnosis present

## 2023-06-28 DIAGNOSIS — F32A Depression, unspecified: Secondary | ICD-10-CM | POA: Diagnosis present

## 2023-06-28 DIAGNOSIS — Z888 Allergy status to other drugs, medicaments and biological substances status: Secondary | ICD-10-CM

## 2023-06-28 DIAGNOSIS — E1151 Type 2 diabetes mellitus with diabetic peripheral angiopathy without gangrene: Secondary | ICD-10-CM | POA: Diagnosis present

## 2023-06-28 DIAGNOSIS — Z801 Family history of malignant neoplasm of trachea, bronchus and lung: Secondary | ICD-10-CM

## 2023-06-28 DIAGNOSIS — I2510 Atherosclerotic heart disease of native coronary artery without angina pectoris: Secondary | ICD-10-CM | POA: Diagnosis not present

## 2023-06-28 DIAGNOSIS — D62 Acute posthemorrhagic anemia: Secondary | ICD-10-CM | POA: Diagnosis not present

## 2023-06-28 DIAGNOSIS — Z7982 Long term (current) use of aspirin: Secondary | ICD-10-CM | POA: Diagnosis not present

## 2023-06-28 DIAGNOSIS — Z885 Allergy status to narcotic agent status: Secondary | ICD-10-CM

## 2023-06-28 DIAGNOSIS — E1142 Type 2 diabetes mellitus with diabetic polyneuropathy: Secondary | ICD-10-CM | POA: Diagnosis present

## 2023-06-28 DIAGNOSIS — I5030 Unspecified diastolic (congestive) heart failure: Secondary | ICD-10-CM | POA: Diagnosis present

## 2023-06-28 DIAGNOSIS — D72829 Elevated white blood cell count, unspecified: Secondary | ICD-10-CM | POA: Diagnosis present

## 2023-06-28 DIAGNOSIS — S7292XA Unspecified fracture of left femur, initial encounter for closed fracture: Principal | ICD-10-CM

## 2023-06-28 DIAGNOSIS — I480 Paroxysmal atrial fibrillation: Secondary | ICD-10-CM | POA: Diagnosis present

## 2023-06-28 DIAGNOSIS — Y92009 Unspecified place in unspecified non-institutional (private) residence as the place of occurrence of the external cause: Secondary | ICD-10-CM | POA: Diagnosis not present

## 2023-06-28 DIAGNOSIS — Z96652 Presence of left artificial knee joint: Secondary | ICD-10-CM | POA: Diagnosis present

## 2023-06-28 DIAGNOSIS — Z95818 Presence of other cardiac implants and grafts: Secondary | ICD-10-CM

## 2023-06-28 DIAGNOSIS — Z7984 Long term (current) use of oral hypoglycemic drugs: Secondary | ICD-10-CM

## 2023-06-28 DIAGNOSIS — Z96651 Presence of right artificial knee joint: Secondary | ICD-10-CM | POA: Diagnosis present

## 2023-06-28 DIAGNOSIS — N179 Acute kidney failure, unspecified: Secondary | ICD-10-CM | POA: Diagnosis present

## 2023-06-28 DIAGNOSIS — Z90721 Acquired absence of ovaries, unilateral: Secondary | ICD-10-CM | POA: Diagnosis not present

## 2023-06-28 LAB — TYPE AND SCREEN
ABO/RH(D): A POS
Antibody Screen: NEGATIVE

## 2023-06-28 LAB — CBC WITH DIFFERENTIAL/PLATELET
Abs Immature Granulocytes: 0.07 10*3/uL (ref 0.00–0.07)
Basophils Absolute: 0.1 10*3/uL (ref 0.0–0.1)
Basophils Relative: 0 %
Eosinophils Absolute: 0.1 10*3/uL (ref 0.0–0.5)
Eosinophils Relative: 1 %
HCT: 46.7 % — ABNORMAL HIGH (ref 36.0–46.0)
Hemoglobin: 14.8 g/dL (ref 12.0–15.0)
Immature Granulocytes: 1 %
Lymphocytes Relative: 10 %
Lymphs Abs: 1.5 10*3/uL (ref 0.7–4.0)
MCH: 29 pg (ref 26.0–34.0)
MCHC: 31.7 g/dL (ref 30.0–36.0)
MCV: 91.6 fL (ref 80.0–100.0)
Monocytes Absolute: 0.9 10*3/uL (ref 0.1–1.0)
Monocytes Relative: 6 %
Neutro Abs: 11.8 10*3/uL — ABNORMAL HIGH (ref 1.7–7.7)
Neutrophils Relative %: 82 %
Platelets: 253 10*3/uL (ref 150–400)
RBC: 5.1 MIL/uL (ref 3.87–5.11)
RDW: 13.2 % (ref 11.5–15.5)
WBC: 14.4 10*3/uL — ABNORMAL HIGH (ref 4.0–10.5)
nRBC: 0 % (ref 0.0–0.2)

## 2023-06-28 LAB — PROTIME-INR
INR: 1.1 (ref 0.8–1.2)
Prothrombin Time: 14.3 s (ref 11.4–15.2)

## 2023-06-28 LAB — BASIC METABOLIC PANEL
Anion gap: 14 (ref 5–15)
BUN: 19 mg/dL (ref 8–23)
CO2: 27 mmol/L (ref 22–32)
Calcium: 9.2 mg/dL (ref 8.9–10.3)
Chloride: 97 mmol/L — ABNORMAL LOW (ref 98–111)
Creatinine, Ser: 1.09 mg/dL — ABNORMAL HIGH (ref 0.44–1.00)
GFR, Estimated: 51 mL/min — ABNORMAL LOW (ref >60)
Glucose, Bld: 107 mg/dL — ABNORMAL HIGH (ref 70–99)
Potassium: 3.5 mmol/L (ref 3.5–5.1)
Sodium: 138 mmol/L (ref 135–145)

## 2023-06-28 MED ORDER — FENTANYL CITRATE PF 50 MCG/ML IJ SOSY
25.0000 ug | PREFILLED_SYRINGE | INTRAMUSCULAR | Status: DC | PRN
  Administered 2023-06-29 (×2): 50 ug via INTRAVENOUS
  Filled 2023-06-28 (×3): qty 1

## 2023-06-28 MED ORDER — INSULIN ASPART 100 UNIT/ML IJ SOLN
0.0000 [IU] | INTRAMUSCULAR | Status: DC
Start: 2023-06-29 — End: 2023-07-03
  Administered 2023-06-29 (×3): 1 [IU] via SUBCUTANEOUS
  Administered 2023-06-29: 2 [IU] via SUBCUTANEOUS
  Administered 2023-06-29: 1 [IU] via SUBCUTANEOUS
  Administered 2023-06-30: 4 [IU] via SUBCUTANEOUS
  Administered 2023-06-30: 5 [IU] via SUBCUTANEOUS
  Administered 2023-06-30 (×2): 3 [IU] via SUBCUTANEOUS
  Administered 2023-06-30: 2 [IU] via SUBCUTANEOUS
  Administered 2023-07-01: 1 [IU] via SUBCUTANEOUS
  Administered 2023-07-01 (×2): 3 [IU] via SUBCUTANEOUS
  Administered 2023-07-01: 2 [IU] via SUBCUTANEOUS
  Administered 2023-07-01: 3 [IU] via SUBCUTANEOUS
  Administered 2023-07-01: 1 [IU] via SUBCUTANEOUS
  Administered 2023-07-02: 3 [IU] via SUBCUTANEOUS
  Administered 2023-07-02 (×2): 1 [IU] via SUBCUTANEOUS
  Administered 2023-07-02 (×3): 3 [IU] via SUBCUTANEOUS
  Administered 2023-07-03 (×2): 1 [IU] via SUBCUTANEOUS
  Administered 2023-07-03 (×2): 2 [IU] via SUBCUTANEOUS

## 2023-06-28 MED ORDER — FAMOTIDINE 20 MG PO TABS
40.0000 mg | ORAL_TABLET | Freq: Every day | ORAL | Status: DC
  Administered 2023-06-29 – 2023-07-03 (×5): 40 mg via ORAL
  Filled 2023-06-28 (×5): qty 2

## 2023-06-28 MED ORDER — OXYCODONE HCL 5 MG PO TABS
5.0000 mg | ORAL_TABLET | ORAL | Status: DC | PRN
  Administered 2023-06-30 – 2023-07-03 (×7): 5 mg via ORAL
  Filled 2023-06-28 (×8): qty 1

## 2023-06-28 MED ORDER — METOPROLOL TARTRATE 50 MG PO TABS
100.0000 mg | ORAL_TABLET | Freq: Two times a day (BID) | ORAL | Status: DC
  Administered 2023-06-29 – 2023-07-03 (×8): 100 mg via ORAL
  Filled 2023-06-28 (×9): qty 2

## 2023-06-28 MED ORDER — VENLAFAXINE HCL 50 MG PO TABS
50.0000 mg | ORAL_TABLET | Freq: Two times a day (BID) | ORAL | Status: DC
  Administered 2023-06-29 – 2023-07-03 (×9): 50 mg via ORAL
  Filled 2023-06-28 (×11): qty 1

## 2023-06-28 MED ORDER — BISACODYL 5 MG PO TBEC
5.0000 mg | DELAYED_RELEASE_TABLET | Freq: Every day | ORAL | Status: DC | PRN
  Administered 2023-07-03: 5 mg via ORAL
  Filled 2023-06-28: qty 1

## 2023-06-28 MED ORDER — GABAPENTIN 300 MG PO CAPS: 300.0000 mg | ORAL_CAPSULE | ORAL | Status: DC

## 2023-06-28 MED ORDER — ONDANSETRON HCL 4 MG/2ML IJ SOLN
4.0000 mg | Freq: Four times a day (QID) | INTRAMUSCULAR | Status: DC | PRN
  Administered 2023-06-29 – 2023-06-30 (×2): 4 mg via INTRAVENOUS
  Filled 2023-06-28 (×2): qty 2

## 2023-06-28 MED ORDER — FENTANYL CITRATE PF 50 MCG/ML IJ SOSY
50.0000 ug | PREFILLED_SYRINGE | INTRAMUSCULAR | Status: AC | PRN
  Administered 2023-06-28 (×2): 50 ug via INTRAVENOUS
  Filled 2023-06-28 (×2): qty 1

## 2023-06-28 MED ORDER — GABAPENTIN 300 MG PO CAPS
300.0000 mg | ORAL_CAPSULE | Freq: Every day | ORAL | Status: DC
  Administered 2023-06-29 – 2023-07-03 (×5): 300 mg via ORAL
  Filled 2023-06-28 (×5): qty 1

## 2023-06-28 MED ORDER — ONDANSETRON HCL 4 MG/2ML IJ SOLN
4.0000 mg | Freq: Once | INTRAMUSCULAR | Status: AC
  Administered 2023-06-28: 4 mg via INTRAVENOUS
  Filled 2023-06-28: qty 2

## 2023-06-28 MED ORDER — POLYETHYLENE GLYCOL 3350 17 G PO PACK
17.0000 g | PACK | Freq: Every day | ORAL | Status: DC | PRN
  Administered 2023-07-01 – 2023-07-03 (×2): 17 g via ORAL
  Filled 2023-06-28 (×2): qty 1

## 2023-06-28 MED ORDER — GABAPENTIN 300 MG PO CAPS
600.0000 mg | ORAL_CAPSULE | Freq: Every day | ORAL | Status: DC
  Administered 2023-06-29 – 2023-07-02 (×5): 600 mg via ORAL
  Filled 2023-06-28 (×5): qty 2

## 2023-06-28 MED ORDER — METHOCARBAMOL 500 MG PO TABS
500.0000 mg | ORAL_TABLET | Freq: Four times a day (QID) | ORAL | Status: DC | PRN
  Administered 2023-06-29 – 2023-07-03 (×3): 500 mg via ORAL
  Filled 2023-06-28 (×3): qty 1

## 2023-06-28 NOTE — H&P (Signed)
History and Physical    [name] GEX:[id number] DOB: [birth year] DOA: 06/28/2023  PCP: Emilio Aspen, MD   Patient coming from: Home   Chief Complaint: Left leg pain after fall   HPI: [name] is a 82 y.o. female with medical history significant for hypertension, type 2 diabetes mellitus, hyperlipidemia, chronic HFpEF, depression, and PAF not anticoagulated due to GI bleeding and s/p Watchman procedure who presents with left leg pain and hip deformity after a fall at home.  Patient reports that she was in her usual state and having an uneventful day when she lost balance and fell onto her left hip.  She has been experiencing severe pain and inability to bear weight.  She denies any recent chest discomfort, fever, or chills.  ED Course: Upon arrival to the ED, patient is found to be afebrile and saturating well on room air initially with normal heart rate and elevated blood pressure.  Labs are most notable for creatinine 1.09 and WBC 14,400.  Imaging reveals left intertrochanteric femur fracture.  Orthopedic surgery was consulted by the ED physician and the patient was treated with fentanyl and Zofran.  Review of Systems:  All other systems reviewed and apart from HPI, are negative.  Past Medical History:  Diagnosis Date   Acute GI bleeding 05/13/2015   Anxiety    Arthritis    Asthma    Atrial fibrillation (HCC) 10/29/2017   Bakers cyst, left    Chronic kidney disease    stage 3   Constipation    Diabetes mellitus    type 2   Dysrhythmia    GERD (gastroesophageal reflux disease)    Heart murmur    History of cellulitis    History of dizziness    History of hiatal hernia    tiny 06/15/2016   Hypertension    Iron deficiency anemia    Peripheral vascular disease (HCC)    PONV (postoperative nausea and vomiting)    Pulmonary nodule     Past Surgical History:  Procedure Laterality Date   APPENDECTOMY  1952   BIOPSY  10/16/2018   Procedure: BIOPSY;   Surgeon: Vida Rigger, MD;  Location: [facility] ENDOSCOPY;  Service: Endoscopy;;   BIOPSY  11/26/2019   Procedure: BIOPSY;  Surgeon: Vida Rigger, MD;  Location: [facility] ENDOSCOPY;  Service: Endoscopy;;   BIOPSY  07/14/2020   Procedure: BIOPSY;  Surgeon: Bernette Redbird, MD;  Location: [facility]OS[facility];  Service: Endoscopy;;   CARDIOVERSION N/A 09/09/2020   Procedure: CARDIOVERSION;  Surgeon: Wendall Stade, MD;  Location: [facility] ENDOSCOPY;  Service: Cardiovascular;  Laterality: N/A;   CARDIOVERSION N/A 11/18/2020   Procedure: CARDIOVERSION;  Surgeon: Little Ishikawa, MD;  Location: [facility] ENDOSCOPY;  Service: Cardiovascular;  Laterality: N/A;   COLONOSCOPY W/ POLYPECTOMY  05/15/2013   ESOPHAGOGASTRODUODENOSCOPY (EGD) WITH PROPOFOL N/A 06/15/2016   Procedure: ESOPHAGOGASTRODUODENOSCOPY (EGD) WITH PROPOFOL;  Surgeon: Vida Rigger, MD;  Location: [facility] ENDOSCOPY;  Service: Endoscopy;  Laterality: N/A;   ESOPHAGOGASTRODUODENOSCOPY (EGD) WITH PROPOFOL N/A 10/16/2018   Procedure: ESOPHAGOGASTRODUODENOSCOPY (EGD) WITH PROPOFOL;  Surgeon: Vida Rigger, MD;  Location: [facility]OS[facility];  Service: Endoscopy;  Laterality: N/A;   ESOPHAGOGASTRODUODENOSCOPY (EGD) WITH PROPOFOL N/A 11/26/2019   Procedure: ESOPHAGOGASTRODUODENOSCOPY (EGD) WITH PROPOFOL;  Surgeon: Vida Rigger, MD;  Location: [facility] ENDOSCOPY;  Service: Endoscopy;  Laterality: N/A;   ESOPHAGOGASTRODUODENOSCOPY (EGD) WITH PROPOFOL N/A 07/14/2020   Procedure: ESOPHAGOGASTRODUODENOSCOPY (EGD) WITH PROPOFOL;  Surgeon: Bernette Redbird, MD;  Location: [facility] ENDOSCOPY;  Service: Endoscopy;  Laterality: N/A;  EYE SURGERY Bilateral    cataracts removed   GI RADIOFREQUENCY ABLATION  10/16/2018   Procedure: GI RADIOFREQUENCY ABLATION;  Surgeon: Vida Rigger, MD;  Location: [facility] ENDOSCOPY;  Service: Endoscopy;;   GI RADIOFREQUENCY ABLATION N/A 11/26/2019   Procedure: GI RADIOFREQUENCY ABLATION;  Surgeon: Vida Rigger, MD;  Location: [facility] ENDOSCOPY;  Service: Endoscopy;  Laterality: N/A;    HAND SURGERY Left 2010   operated on index and ring finger   HOT HEMOSTASIS N/A 07/14/2020   Procedure: HOT HEMOSTASIS (ARGON PLASMA COAGULATION/BICAP);  Surgeon: Bernette Redbird, MD;  Location: [facility] ENDOSCOPY;  Service: Endoscopy;  Laterality: N/A;   INCONTINENCE SURGERY     JOINT REPLACEMENT Right    knee   KNEE ARTHROSCOPY  2003 2005   right knee   KYPHOPLASTY N/A 01/29/2020   Procedure: T8 KYPHOPLASTY;  Surgeon: Venita Lick, MD;  Location: [facility] OR;  Service: Orthopedics;  Laterality: N/A;  60 mins Local with [facility]   LEFT ATRIAL APPENDAGE OCCLUSION N/A 07/01/2020   Procedure: LEFT ATRIAL APPENDAGE OCCLUSION;  Surgeon: Lanier Prude, MD;  Location: [facility] INVASIVE CV LAB;  Service: Cardiovascular;  Laterality: N/A;   mass fallopian tube     SPINAL CORD STIMULATOR BATTERY EXCHANGE N/A 09/09/2015   Procedure: SPINAL CORD STIMULATOR BATTERY EXCHANGE;  Surgeon: Venita Lick, MD;  Location: [facility] OR;  Service: Orthopedics;  Laterality: N/A;   SPINAL CORD STIMULATOR BATTERY EXCHANGE N/A 09/08/2021   Procedure: SPINAL CORD STIMULATOR BATTERY EXCHANGE;  Surgeon: Venita Lick, MD;  Location: [facility] OR;  Service: Orthopedics;  Laterality: N/A;   TEE WITHOUT CARDIOVERSION N/A 08/18/2020   Procedure: TRANSESOPHAGEAL ECHOCARDIOGRAM (TEE);  Surgeon: Wendall Stade, MD;  Location: [facility] ENDOSCOPY;  Service: Cardiovascular;  Laterality: N/A;   TEE WITHOUT CARDIOVERSION N/A 09/09/2020   Procedure: TRANSESOPHAGEAL ECHOCARDIOGRAM (TEE);  Surgeon: Wendall Stade, MD;  Location: [facility] ENDOSCOPY;  Service: Cardiovascular;  Laterality: N/A;   TEE WITHOUT CARDIOVERSION N/A 11/18/2020   Procedure: TRANSESOPHAGEAL ECHOCARDIOGRAM (TEE);  Surgeon: Little Ishikawa, MD;  Location: [facility] ENDOSCOPY;  Service: Cardiovascular;  Laterality: N/A;   TOTAL KNEE ARTHROPLASTY Left 11/05/2017   Procedure: LEFT TOTAL KNEE ARTHROPLASTY;  Surgeon: Durene Romans, MD;  Location: [facility] ORS;  Service: Orthopedics;  Laterality: Left;   Adductor Block   TUBAL LIGATION     VAGINAL HYSTERECTOMY  1977   1 ovary removed    Social History:   reports that she has never smoked. She has never used smokeless tobacco. She reports that she does not drink alcohol and does not use drugs.  Allergies  Allergen Reactions   Vasotec Shortness Of Breath and Other (See Comments)    Wheezing, also   Atorvastatin Other (See Comments)    Leg cramps    Codeine Nausea And Vomiting   Cymbalta [Duloxetine Hcl] Itching   Lansoprazole Itching, Swelling, Rash and Other (See Comments)    Redness, swelling of mouth. During 07/12/20-07/15/20 pt tolerated IV protonix infusion with no reactions.   Morphine And Codeine Itching   Sulfate Itching   Amitriptyline Hcl Other (See Comments)     sleepwalking   Escitalopram Oxalate Itching   Hydrocodone Itching   Iron Other (See Comments)    constipated   Sertraline Hcl Itching   Tramadol Itching   Adhesive [Tape] Rash   Allevyn Adhesive [Wound Dressings] Rash   Scopolamine Rash    Family History  Problem Relation Age of Onset   Lung cancer Mother    Heart attack Father    Colon cancer  Neg Hx    Stroke Neg Hx    Migraines Neg Hx      Prior to Admission medications   Medication Sig Start Date End Date Taking? Authorizing Provider  aspirin EC 81 MG tablet Take 1 tablet (81 mg total) by mouth daily. Swallow whole. 02/11/21   Lanier Prude, MD  busPIRone (BUSPAR) 5 MG tablet     [provider]  Continuous Blood Gluc Sensor (FREESTYLE LIBRE 14 DAY SENSOR) MISC Inject 1 patch into the skin every 14 (fourteen) days.    [provider]  docusate sodium (COLACE) 100 MG capsule Take 100 mg by mouth daily as needed for moderate constipation.    [provider]  DROPLET PEN NEEDLES 32G X 4 MM MISC  04/19/20   [provider]  famotidine (PEPCID) 40 MG tablet Take 40 mg by mouth daily. 05/20/23   [provider]  furosemide (LASIX) 40 MG tablet Take 1  tablet (40 mg total) by mouth daily. May take (1) extra tablet for increased wt overnight (2-3 lbs) 06/25/23   Jake Bathe, MD  gabapentin (NEURONTIN) 300 MG capsule Take 300-600 mg by mouth See admin instructions. Take 300 mg in the morning and 600 mg at bedtime    [provider]  insulin glargine (LANTUS) 100 UNIT/ML Solostar Pen Inject 22 Units into the skin at bedtime.    [provider]  insulin lispro (HUMALOG KWIKPEN) 100 UNIT/ML KwikPen     [provider]  ipratropium (ATROVENT) 0.06 % nasal spray Place 1 spray into both nostrils daily. 06/28/22   Jake Bathe, MD  JARDIANCE 10 MG TABS tablet Take by mouth. 10/27/21   [provider]  KLOR-CON M20 20 MEQ tablet TAKE 2 TABLETS BY MOUTH EVERY DAY 06/18/23   Jake Bathe, MD  losartan (COZAAR) 100 MG tablet     [provider]  metoCLOPramide (REGLAN) 10 MG tablet     [provider]  metoprolol tartrate (LOPRESSOR) 100 MG tablet TAKE 1 TABLET BY MOUTH TWICE A DAY 06/18/23   Jake Bathe, MD  Multiple Vitamins-Minerals (PRESERVISION AREDS 2 PO) Take 1 capsule by mouth 2 (two) times daily.     [provider]  NOVOLOG FLEXPEN 100 UNIT/ML FlexPen Inject 10-14 Units into the skin See admin instructions. Inject 10-14 units into the skin, PER SLIDING SCALE, in the morning, noon, and bedtime 09/04/19   [provider]  omega-3 acid ethyl esters (LOVAZA) 1 g capsule Take 1 g by mouth daily.    [provider]  pantoprazole (PROTONIX) 40 MG tablet Take 40 mg by mouth daily as needed (Heartburn).    [provider]  Pitavastatin Calcium (LIVALO) 1 MG TABS Take 2 mg by mouth at bedtime.    [provider]  Triamterene (DYRENIUM PO)     [provider]  VASCEPA 1 g capsule Take 2 g by mouth 2 (two) times daily. 06/29/22   [provider]  venlafaxine (EFFEXOR) 37.5 MG tablet Take 50 mg by mouth 2 (two) times daily with a meal. 10/27/19    [provider]    Physical Exam: Vitals:   06/28/23 2033 06/28/23 2035 06/28/23 2037 06/28/23 2330  BP:  (!) 180/59  (!) 149/57  Pulse:  65  62  Resp:  15  17  Temp:  97.8 F (36.6 C)    TempSrc:  Oral    SpO2: 95% 98%  100%  Weight:  60.8 kg   Height:   5\' 4"  (1.626 m)     Constitutional: NAD, no pallor or diaphoresis  Eyes: PERTLA, lids and conjunctivae normal ENMT: Mucous membranes are moist. Posterior pharynx clear of any exudate or lesions.   Neck: supple, no masses  Respiratory: no wheezing, no crackles. No accessory muscle use.  Cardiovascular: S1 & S2 heard, regular rate and rhythm. No extremity edema.   Abdomen: No distension, no tenderness, soft. Bowel sounds active.  Musculoskeletal: no clubbing / cyanosis. Left hip tender and deformed, neurovascularly intact.   Skin: no significant rashes, lesions, ulcers. Warm, dry, well-perfused. Neurologic: CN 2-12 grossly intact. Moving all extremities. Alert and oriented.  Psychiatric: Calm. Cooperative.    Labs and Imaging on Admission: I have personally reviewed following labs and imaging studies  CBC: Recent Labs  Lab 06/28/23 2034  WBC 14.4*  NEUTROABS 11.8*  HGB 14.8  HCT 46.7*  MCV 91.6  PLT 253   Basic Metabolic Panel: Recent Labs  Lab 06/28/23 2034  NA 138  K 3.5  CL 97*  CO2 27  GLUCOSE 107*  BUN 19  CREATININE 1.09*  CALCIUM 9.2   GFR: Estimated Creatinine Clearance: 34.4 mL/min (A) (by C-G formula based on SCr of 1.09 mg/dL (H)). Liver Function Tests: No results for input(s): "AST", "ALT", "ALKPHOS", "BILITOT", "PROT", "ALBUMIN" in the last 168 hours. No results for input(s): "LIPASE", "AMYLASE" in the last 168 hours. No results for input(s): "AMMONIA" in the last 168 hours. Coagulation Profile: Recent Labs  Lab 06/28/23 2034  INR 1.1   Cardiac Enzymes: No results for input(s): "CKTOTAL", "CKMB", "CKMBINDEX", "TROPONINI" in the last 168 hours. BNP (last 3 results) Recent  Labs    06/05/23 1523  PROBNP 1,103*   HbA1C: No results for input(s): "HGBA1C" in the last 72 hours. CBG: No results for input(s): "GLUCAP" in the last 168 hours. Lipid Profile: No results for input(s): "CHOL", "HDL", "LDLCALC", "TRIG", "CHOLHDL", "LDLDIRECT" in the last 72 hours. Thyroid Function Tests: No results for input(s): "TSH", "T4TOTAL", "FREET4", "T3FREE", "THYROIDAB" in the last 72 hours. Anemia Panel: No results for input(s): "VITAMINB12", "FOLATE", "FERRITIN", "TIBC", "IRON", "RETICCTPCT" in the last 72 hours. Urine analysis:    Component Value Date/Time   COLORURINE STRAW (A) 11/19/2020 1930   APPEARANCEUR CLEAR 11/19/2020 1930   LABSPEC 1.008 11/19/2020 1930   PHURINE 5.0 11/19/2020 1930   GLUCOSEU NEGATIVE 11/19/2020 1930   HGBUR NEGATIVE 11/19/2020 1930   BILIRUBINUR NEGATIVE 11/19/2020 1930   KETONESUR NEGATIVE 11/19/2020 1930   PROTEINUR NEGATIVE 11/19/2020 1930   UROBILINOGEN 0.2 03/20/2011 2257   NITRITE NEGATIVE 11/19/2020 1930   LEUKOCYTESUR NEGATIVE 11/19/2020 1930   Sepsis Labs: @LABRCNTIP (procalcitonin:4,lacticidven:4) )No results found for this or any previous visit (from the past 240 hour(s)).   Radiological Exams on Admission: DG Hip Unilat With Pelvis 2-3 Views Left  Result Date: 06/28/2023 CLINICAL DATA:  Fall with hip fracture EXAM: DG HIP (WITH OR WITHOUT PELVIS) 2-3V LEFT COMPARISON:  08/11/2022 FINDINGS: Acute comminuted, displaced and angulated left intertrochanteric fracture. Femoral head projects in joint. Pubic symphysis appears intact. Faint calcifications at the right greater than left ischial tuberosity without significant change. IMPRESSION: Acute comminuted, displaced and angulated left intertrochanteric fracture. Electronically Signed   By: Jasmine Pang M.D.   On: 06/28/2023 23:25   DG Femur Min 2 Views Left  Result Date: 06/28/2023 CLINICAL DATA:  Hip fracture EXAM: LEFT FEMUR 2 VIEWS COMPARISON:  None Available. FINDINGS:  Stimulator generator over the left lower quadrant.  Acute comminuted and displaced left intertrochanteric fracture with mild varus angulation. No femoral head dislocation IMPRESSION: Acute comminuted, displaced and mildly angulated left intertrochanteric fracture. Electronically Signed   By: Jasmine Pang M.D.   On: 06/28/2023 23:24   DG Knee 2 Views Left  Result Date: 06/28/2023 CLINICAL DATA:  Hip problem EXAM: LEFT KNEE - 1-2 VIEW COMPARISON:  None Available. FINDINGS: Left knee replacement with intact hardware and normal alignment. Trace knee effusion. No fracture is seen IMPRESSION: Left knee replacement with trace effusion. Electronically Signed   By: Jasmine Pang M.D.   On: 06/28/2023 23:23   ECHOCARDIOGRAM COMPLETE  Result Date: 06/27/2023    ECHOCARDIOGRAM REPORT   Patient Name:   [name] Date of Exam: 06/27/2023 Medical Rec #:  [id number]        Height:       64.0 in Accession #:    [id number]       Weight:       136.0 lb Date of Birth:  11/14/1940         BSA:          1.661 m Patient Age:    82 years         BP:           116/68 mmHg Patient Gender: F                HR:           60 bpm. Exam Location:  [address] Procedure: 2D Echo, 3D Echo, Cardiac Doppler, Color Doppler and Strain Analysis Indications:    R06.02 SOB  History:        Patient has prior history of Echocardiogram examinations, most                 recent 11/18/2020. HFpEF, CAD, S/p Watchman, Arrythmias:Atrial                 Fibrillation, Signs/Symptoms:Fatigue and Shortness of Breath;                 Risk Factors:Hypertension and Diabetes. Previous TEE revealed                 LVEF 60% moderate TR.  Sonographer:    Chanetta Marshall BA, RDCS Referring Phys: [id number] TESSA N CONTE IMPRESSIONS  1. Left ventricular ejection fraction, by estimation, is 60 to 65%. The left ventricle has normal function. The left ventricle has no regional wall motion abnormalities. Left ventricular diastolic parameters were normal. The average left  ventricular global longitudinal strain is -17.1 %. The global longitudinal strain is normal.  2. Right ventricular systolic function is normal. The right ventricular size is normal. There is normal pulmonary artery systolic pressure. The estimated right ventricular systolic pressure is 33.2 mmHg.  3. The mitral valve is degenerative. Trivial mitral valve regurgitation. No evidence of mitral stenosis.  4. The aortic valve is tricuspid. Aortic valve regurgitation is not visualized. Aortic valve sclerosis is present, with no evidence of aortic valve stenosis.  5. The inferior vena cava is normal in size with greater than 50% respiratory variability, suggesting right atrial pressure of 3 mmHg. FINDINGS  Left Ventricle: Left ventricular ejection fraction, by estimation, is 60 to 65%. The left ventricle has normal function. The left ventricle has no regional wall motion abnormalities. The average left ventricular global longitudinal strain is -17.1 %. The global longitudinal strain is normal. The left ventricular internal cavity size was normal in size. There is  no left ventricular hypertrophy. Left ventricular diastolic parameters were normal. Right Ventricle: The right ventricular size is normal. No increase in right ventricular wall thickness. Right ventricular systolic function is normal. There is normal pulmonary artery systolic pressure. The tricuspid regurgitant velocity is 2.75 m/s, and  with an assumed right atrial pressure of 3 mmHg, the estimated right ventricular systolic pressure is 33.2 mmHg. Left Atrium: Left atrial size was normal in size. Right Atrium: Right atrial size was normal in size. Pericardium: There is no evidence of pericardial effusion. Mitral Valve: The mitral valve is degenerative in appearance. There is mild thickening of the mitral valve leaflet(s). Normal mobility of the mitral valve leaflets. Mild mitral annular calcification. Trivial mitral valve regurgitation. No evidence of mitral  valve stenosis. Tricuspid Valve: The tricuspid valve is normal in structure. Tricuspid valve regurgitation is mild . No evidence of tricuspid stenosis. Aortic Valve: The aortic valve is tricuspid. Aortic valve regurgitation is not visualized. Aortic valve sclerosis is present, with no evidence of aortic valve stenosis. Pulmonic Valve: The pulmonic valve was grossly normal. Pulmonic valve regurgitation is not visualized. No evidence of pulmonic stenosis. Aorta: The aortic root is normal in size and structure. Venous: The inferior vena cava is normal in size with greater than 50% respiratory variability, suggesting right atrial pressure of 3 mmHg. IAS/Shunts: The atrial septum is grossly normal.  LEFT VENTRICLE PLAX 2D LVIDd:         4.30 cm   Diastology LVIDs:         2.70 cm   LV e' medial:    9.46 cm/s LV PW:         0.90 cm   LV E/e' medial:  10.7 LV IVS:        1.00 cm   LV e' lateral:   9.03 cm/s LVOT diam:     2.00 cm   LV E/e' lateral: 11.2 LV SV:         51 LV SV Index:   31        2D Longitudinal Strain LVOT Area:     3.14 cm  2D Strain GLS (A2C):   -16.3 %                          2D Strain GLS (A3C):   -17.6 %                          2D Strain GLS (A4C):   -17.3 %                          2D Strain GLS Avg:     -17.1 %                           3D Volume EF:                          3D EF:        61 %                          LV EDV:       119 ml                          LV ESV:  47 ml                          LV SV:        72 ml RIGHT VENTRICLE            IVC RV Basal diam:  3.20 cm    IVC diam: 1.10 cm RV S prime:     9.36 cm/s TAPSE (M-mode): 2.1 cm RVSP:           33.2 mmHg LEFT ATRIUM             Index        RIGHT ATRIUM           Index LA diam:        4.40 cm 2.65 cm/m   RA Pressure: 3.00 mmHg LA Vol (A2C):   31.6 ml 19.00 ml/m  RA Area:     12.40 cm LA Vol (A4C):   27.5 ml 16.56 ml/m  RA Volume:   28.20 ml  16.98 ml/m LA Biplane Vol: 38.9 ml 23.43 ml/m  AORTIC VALVE LVOT Vmax:   66.20  cm/s LVOT Vmean:  45.200 cm/s LVOT VTI:    0.162 m  AORTA Ao Root diam: 2.90 cm MITRAL VALVE                TRICUSPID VALVE MV Area (PHT): 3.89 cm     TR Peak grad:   30.2 mmHg MV Decel Time: 195 msec     TR Vmax:        275.00 cm/s MV E velocity: 101.00 cm/s  Estimated RAP:  3.00 mmHg MV A velocity: 27.20 cm/s   RVSP:           33.2 mmHg MV E/A ratio:  3.71                             SHUNTS                             Systemic VTI:  0.16 m                             Systemic Diam: 2.00 cm Sunit Tolia Electronically signed by Tessa Lerner Signature Date/Time: 06/27/2023/5:44:29 PM    Final     EKG: Independently reviewed. Sinus rhythm.   Assessment/Plan   1. Left hip fracture  - Based on the available data, Mrs. Scoles presents an estimated 0.85% risk of perioperative MI or cardiac arrest; no further preoperative cardiac evaluation is indicated  - Continue pain-control and supportive care, keep NPO after midnight, hold ASA    2. PAF  - Could not tolerated anticoagulation d/t GI bleeding and is s/p Wathcman procedure  - Continue metoprolol   3. CKD 3A - Appears close to baseline - Renally-dose medications, monitor    4. Hypertension  - Metoprolol    5. Type II DM  - Check CBGs and use low-intensity SSI for now    6. Chronic HFpEF  - Appears compensated  - Hold Lasix while NPO   7. Depression  - Effexor    DVT prophylaxis: SCDs  Code Status: Full  Level of Care: Level of care: Med-Surg Family Communication: Husband at bedside  Disposition Plan:  Patient is from: home  Anticipated d/c is to: TBD Anticipated d/c  date is: 07/02/23  Patient currently: Pending orthopedic surgery consultation and likely operative hip repair  Consults called: Orthopedic surgery  Admission status: Inpatient     Briscoe Deutscher, MD [facility]  06/28/2023, 11:59 PM

## 2023-06-28 NOTE — Progress Notes (Signed)
Consult request received for left four part intertrochanteric fracture. Have discussed with the requesting MD patient's stability, pain control, and ongoing work up. I have reviewed x-rays and developed a provisional plan for repair tomorrow.   Full consultation to follow in the am. NPO p MN.  Myrene Galas, MD [facility], [facility] [phone]

## 2023-06-28 NOTE — ED Notes (Signed)
ED TO INPATIENT HANDOFF REPORT  ED Nurse Name and Phone #:  Marcello Moores [phone]  S Name/Age/Gender [name] 82 y.o. female Room/Bed: 019C/019C  Code Status   Code Status: Prior  Home/SNF/Other Home Patient oriented to: self, place, time, and situation Is this baseline? Yes   Triage Complete: Triage complete  Chief Complaint Closed left hip fracture, initial encounter [facility]) [S72.002A]  Triage Note Patient BIB [facility] due to fall. Patient has obvious deformity to left leg. Pulses in tact. Patient denies hitting head or being on thinners. Patient takes 81mg  aspirin. Patient A&Ox4.   Allergies Allergies  Allergen Reactions   Vasotec Shortness Of Breath and Other (See Comments)    Wheezing, also   Atorvastatin Other (See Comments)    Leg cramps    Codeine Nausea And Vomiting   Cymbalta [Duloxetine Hcl] Itching   Lansoprazole Itching, Swelling, Rash and Other (See Comments)    Redness, swelling of mouth. During 07/12/20-07/15/20 pt tolerated IV protonix infusion with no reactions.   Morphine And Codeine Itching   Sulfate Itching   Amitriptyline Hcl Other (See Comments)     sleepwalking   Escitalopram Oxalate Itching   Hydrocodone Itching   Iron Other (See Comments)    constipated   Sertraline Hcl Itching   Tramadol Itching   Adhesive [Tape] Rash   Allevyn Adhesive [Wound Dressings] Rash   Scopolamine Rash    Level of Care/Admitting Diagnosis ED Disposition     ED Disposition  Admit   Condition  --   Comment  Hospital Area: [facility] [[id number]]  Level of Care: Med-Surg [16]  May admit patient to [facility] or [facility] if equivalent level of care is available:: No  Covid Evaluation: Asymptomatic - no recent exposure (last 10 days) testing not required  Diagnosis: Closed left hip fracture, initial encounter [facility]) [[id number]]  Admitting Physician: Briscoe Deutscher [[id number]]  Attending Physician: Briscoe Deutscher [[id number]]  Certification:: I  certify this patient will need inpatient services for at least 2 midnights  Expected Medical Readiness: 06/30/2023          B Medical/Surgery History Past Medical History:  Diagnosis Date   Acute GI bleeding 05/13/2015   Anxiety    Arthritis    Asthma    Atrial fibrillation (HCC) 10/29/2017   Bakers cyst, left    Chronic kidney disease    stage 3   Constipation    Diabetes mellitus    type 2   Dysrhythmia    GERD (gastroesophageal reflux disease)    Heart murmur    History of cellulitis    History of dizziness    History of hiatal hernia    tiny 06/15/2016   Hypertension    Iron deficiency anemia    Peripheral vascular disease (HCC)    PONV (postoperative nausea and vomiting)    Pulmonary nodule    Past Surgical History:  Procedure Laterality Date   APPENDECTOMY  1952   BIOPSY  10/16/2018   Procedure: BIOPSY;  Surgeon: Vida Rigger, MD;  Location: [facility] ENDOSCOPY;  Service: Endoscopy;;   BIOPSY  11/26/2019   Procedure: BIOPSY;  Surgeon: Vida Rigger, MD;  Location: [facility] ENDOSCOPY;  Service: Endoscopy;;   BIOPSY  07/14/2020   Procedure: BIOPSY;  Surgeon: Bernette Redbird, MD;  Location: [facility] ENDOSCOPY;  Service: Endoscopy;;   CARDIOVERSION N/A 09/09/2020   Procedure: CARDIOVERSION;  Surgeon: Wendall Stade, MD;  Location: [facility] ENDOSCOPY;  Service: Cardiovascular;  Laterality: N/A;   CARDIOVERSION N/A  11/18/2020   Procedure: CARDIOVERSION;  Surgeon: Little Ishikawa, MD;  Location: [facility] ENDOSCOPY;  Service: Cardiovascular;  Laterality: N/A;   COLONOSCOPY W/ POLYPECTOMY  05/15/2013   ESOPHAGOGASTRODUODENOSCOPY (EGD) WITH PROPOFOL N/A 06/15/2016   Procedure: ESOPHAGOGASTRODUODENOSCOPY (EGD) WITH PROPOFOL;  Surgeon: Vida Rigger, MD;  Location: [facility] ENDOSCOPY;  Service: Endoscopy;  Laterality: N/A;   ESOPHAGOGASTRODUODENOSCOPY (EGD) WITH PROPOFOL N/A 10/16/2018   Procedure: ESOPHAGOGASTRODUODENOSCOPY (EGD) WITH PROPOFOL;  Surgeon: Vida Rigger, MD;  Location: [facility] ENDOSCOPY;   Service: Endoscopy;  Laterality: N/A;   ESOPHAGOGASTRODUODENOSCOPY (EGD) WITH PROPOFOL N/A 11/26/2019   Procedure: ESOPHAGOGASTRODUODENOSCOPY (EGD) WITH PROPOFOL;  Surgeon: Vida Rigger, MD;  Location: [facility] ENDOSCOPY;  Service: Endoscopy;  Laterality: N/A;   ESOPHAGOGASTRODUODENOSCOPY (EGD) WITH PROPOFOL N/A 07/14/2020   Procedure: ESOPHAGOGASTRODUODENOSCOPY (EGD) WITH PROPOFOL;  Surgeon: Bernette Redbird, MD;  Location: [facility] ENDOSCOPY;  Service: Endoscopy;  Laterality: N/A;   EYE SURGERY Bilateral    cataracts removed   GI RADIOFREQUENCY ABLATION  10/16/2018   Procedure: GI RADIOFREQUENCY ABLATION;  Surgeon: Vida Rigger, MD;  Location: [facility] ENDOSCOPY;  Service: Endoscopy;;   GI RADIOFREQUENCY ABLATION N/A 11/26/2019   Procedure: GI RADIOFREQUENCY ABLATION;  Surgeon: Vida Rigger, MD;  Location: [facility] ENDOSCOPY;  Service: Endoscopy;  Laterality: N/A;   HAND SURGERY Left 2010   operated on index and ring finger   HOT HEMOSTASIS N/A 07/14/2020   Procedure: HOT HEMOSTASIS (ARGON PLASMA COAGULATION/BICAP);  Surgeon: Bernette Redbird, MD;  Location: [facility] ENDOSCOPY;  Service: Endoscopy;  Laterality: N/A;   INCONTINENCE SURGERY     JOINT REPLACEMENT Right    knee   KNEE ARTHROSCOPY  2003 2005   right knee   KYPHOPLASTY N/A 01/29/2020   Procedure: T8 KYPHOPLASTY;  Surgeon: Venita Lick, MD;  Location: [facility] OR;  Service: Orthopedics;  Laterality: N/A;  60 mins Local with [facility]   LEFT ATRIAL APPENDAGE OCCLUSION N/A 07/01/2020   Procedure: LEFT ATRIAL APPENDAGE OCCLUSION;  Surgeon: Lanier Prude, MD;  Location: [facility] INVASIVE CV LAB;  Service: Cardiovascular;  Laterality: N/A;   mass fallopian tube     SPINAL CORD STIMULATOR BATTERY EXCHANGE N/A 09/09/2015   Procedure: SPINAL CORD STIMULATOR BATTERY EXCHANGE;  Surgeon: Venita Lick, MD;  Location: [facility] OR;  Service: Orthopedics;  Laterality: N/A;   SPINAL CORD STIMULATOR BATTERY EXCHANGE N/A 09/08/2021   Procedure: SPINAL CORD STIMULATOR BATTERY EXCHANGE;   Surgeon: Venita Lick, MD;  Location: [facility] OR;  Service: Orthopedics;  Laterality: N/A;   TEE WITHOUT CARDIOVERSION N/A 08/18/2020   Procedure: TRANSESOPHAGEAL ECHOCARDIOGRAM (TEE);  Surgeon: Wendall Stade, MD;  Location: [facility] ENDOSCOPY;  Service: Cardiovascular;  Laterality: N/A;   TEE WITHOUT CARDIOVERSION N/A 09/09/2020   Procedure: TRANSESOPHAGEAL ECHOCARDIOGRAM (TEE);  Surgeon: Wendall Stade, MD;  Location: [facility] ENDOSCOPY;  Service: Cardiovascular;  Laterality: N/A;   TEE WITHOUT CARDIOVERSION N/A 11/18/2020   Procedure: TRANSESOPHAGEAL ECHOCARDIOGRAM (TEE);  Surgeon: Little Ishikawa, MD;  Location: [facility] ENDOSCOPY;  Service: Cardiovascular;  Laterality: N/A;   TOTAL KNEE ARTHROPLASTY Left 11/05/2017   Procedure: LEFT TOTAL KNEE ARTHROPLASTY;  Surgeon: Durene Romans, MD;  Location: [facility] ORS;  Service: Orthopedics;  Laterality: Left;  Adductor Block   TUBAL LIGATION     VAGINAL HYSTERECTOMY  1977   1 ovary removed     A IV Location/Drains/Wounds Patient Lines/Drains/Airways Status     Active Line/Drains/Airways     Name Placement date Placement time Site Days   Peripheral IV 06/28/23 22 G Right Antecubital 06/28/23  2056  Antecubital  less than 1  External Urinary Catheter 11/19/20  1712  --  951            Intake/Output Last 24 hours No intake or output data in the 24 hours ending 06/28/23 2258  Labs/Imaging Results for orders placed or performed during the hospital encounter of 06/28/23 (from the past 48 hour(s))  Basic metabolic panel     Status: Abnormal   Collection Time: 06/28/23  8:34 PM  Result Value Ref Range   Sodium 138 135 - 145 mmol/L   Potassium 3.5 3.5 - 5.1 mmol/L   Chloride 97 (L) 98 - 111 mmol/L   CO2 27 22 - 32 mmol/L   Glucose, Bld 107 (H) 70 - 99 mg/dL    Comment: Glucose reference range applies only to samples taken after fasting for at least 8 hours.   BUN 19 8 - 23 mg/dL   Creatinine, Ser 4.09 (H) 0.44 - 1.00 mg/dL   Calcium 9.2 8.9 - 81.1  mg/dL   GFR, Estimated 51 (L) >60 mL/min    Comment: (NOTE) Calculated using the CKD-EPI Creatinine Equation (2021)    Anion gap 14 5 - 15    Comment: Performed at [facility], [address], [place], [place] [address]  CBC with Differential     Status: Abnormal   Collection Time: 06/28/23  8:34 PM  Result Value Ref Range   WBC 14.4 (H) 4.0 - 10.5 K/uL   RBC 5.10 3.87 - 5.11 MIL/uL   Hemoglobin 14.8 12.0 - 15.0 g/dL   HCT 29.5 (H) 62.1 - 30.8 %   MCV 91.6 80.0 - 100.0 fL   MCH 29.0 26.0 - 34.0 pg   MCHC 31.7 30.0 - 36.0 g/dL   RDW 65.7 84.6 - 96.2 %   Platelets 253 150 - 400 K/uL   nRBC 0.0 0.0 - 0.2 %   Neutrophils Relative % 82 %   Neutro Abs 11.8 (H) 1.7 - 7.7 K/uL   Lymphocytes Relative 10 %   Lymphs Abs 1.5 0.7 - 4.0 K/uL   Monocytes Relative 6 %   Monocytes Absolute 0.9 0.1 - 1.0 K/uL   Eosinophils Relative 1 %   Eosinophils Absolute 0.1 0.0 - 0.5 K/uL   Basophils Relative 0 %   Basophils Absolute 0.1 0.0 - 0.1 K/uL   Immature Granulocytes 1 %   Abs Immature Granulocytes 0.07 0.00 - 0.07 K/uL    Comment: Performed at [facility], [address], [place], [place] [address]  Protime-INR     Status: None   Collection Time: 06/28/23  8:34 PM  Result Value Ref Range   Prothrombin Time 14.3 11.4 - 15.2 seconds   INR 1.1 0.8 - 1.2    Comment: (NOTE) INR goal varies based on device and disease states. Performed at [facility], [address] [place], [place] [address]   Type and screen [facility]     Status: None   Collection Time: 06/28/23  8:34 PM  Result Value Ref Range   ABO/RH(D) A POS    Antibody Screen NEG    Sample Expiration      07/01/2023,2359 Performed at [facility], [address] [place], [place] [address]    ECHOCARDIOGRAM COMPLETE  Result Date: 06/27/2023    ECHOCARDIOGRAM REPORT   Patient Name:   [name] Date of Exam: 06/27/2023 Medical Rec #:  [id number]        Height:  64.0 in  Accession #:    [id number]       Weight:       136.0 lb Date of Birth:  1941-07-24         BSA:          1.661 m Patient Age:    82 years         BP:           116/68 mmHg Patient Gender: F                HR:           60 bpm. Exam Location:  [address] Procedure: 2D Echo, 3D Echo, Cardiac Doppler, Color Doppler and Strain Analysis Indications:    R06.02 SOB  History:        Patient has prior history of Echocardiogram examinations, most                 recent 11/18/2020. HFpEF, CAD, S/p Watchman, Arrythmias:Atrial                 Fibrillation, Signs/Symptoms:Fatigue and Shortness of Breath;                 Risk Factors:Hypertension and Diabetes. Previous TEE revealed                 LVEF 60% moderate TR.  Sonographer:    Chanetta Marshall BA, RDCS Referring Phys: [id number] TESSA N CONTE IMPRESSIONS  1. Left ventricular ejection fraction, by estimation, is 60 to 65%. The left ventricle has normal function. The left ventricle has no regional wall motion abnormalities. Left ventricular diastolic parameters were normal. The average left ventricular global longitudinal strain is -17.1 %. The global longitudinal strain is normal.  2. Right ventricular systolic function is normal. The right ventricular size is normal. There is normal pulmonary artery systolic pressure. The estimated right ventricular systolic pressure is 33.2 mmHg.  3. The mitral valve is degenerative. Trivial mitral valve regurgitation. No evidence of mitral stenosis.  4. The aortic valve is tricuspid. Aortic valve regurgitation is not visualized. Aortic valve sclerosis is present, with no evidence of aortic valve stenosis.  5. The inferior vena cava is normal in size with greater than 50% respiratory variability, suggesting right atrial pressure of 3 mmHg. FINDINGS  Left Ventricle: Left ventricular ejection fraction, by estimation, is 60 to 65%. The left ventricle has normal function. The left ventricle has no regional wall motion abnormalities. The average  left ventricular global longitudinal strain is -17.1 %. The global longitudinal strain is normal. The left ventricular internal cavity size was normal in size. There is no left ventricular hypertrophy. Left ventricular diastolic parameters were normal. Right Ventricle: The right ventricular size is normal. No increase in right ventricular wall thickness. Right ventricular systolic function is normal. There is normal pulmonary artery systolic pressure. The tricuspid regurgitant velocity is 2.75 m/s, and  with an assumed right atrial pressure of 3 mmHg, the estimated right ventricular systolic pressure is 33.2 mmHg. Left Atrium: Left atrial size was normal in size. Right Atrium: Right atrial size was normal in size. Pericardium: There is no evidence of pericardial effusion. Mitral Valve: The mitral valve is degenerative in appearance. There is mild thickening of the mitral valve leaflet(s). Normal mobility of the mitral valve leaflets. Mild mitral annular calcification. Trivial mitral valve regurgitation. No evidence of mitral valve stenosis. Tricuspid Valve: The tricuspid valve is normal in structure. Tricuspid valve regurgitation is mild . No evidence  of tricuspid stenosis. Aortic Valve: The aortic valve is tricuspid. Aortic valve regurgitation is not visualized. Aortic valve sclerosis is present, with no evidence of aortic valve stenosis. Pulmonic Valve: The pulmonic valve was grossly normal. Pulmonic valve regurgitation is not visualized. No evidence of pulmonic stenosis. Aorta: The aortic root is normal in size and structure. Venous: The inferior vena cava is normal in size with greater than 50% respiratory variability, suggesting right atrial pressure of 3 mmHg. IAS/Shunts: The atrial septum is grossly normal.  LEFT VENTRICLE PLAX 2D LVIDd:         4.30 cm   Diastology LVIDs:         2.70 cm   LV e' medial:    9.46 cm/s LV PW:         0.90 cm   LV E/e' medial:  10.7 LV IVS:        1.00 cm   LV e' lateral:   9.03  cm/s LVOT diam:     2.00 cm   LV E/e' lateral: 11.2 LV SV:         51 LV SV Index:   31        2D Longitudinal Strain LVOT Area:     3.14 cm  2D Strain GLS (A2C):   -16.3 %                          2D Strain GLS (A3C):   -17.6 %                          2D Strain GLS (A4C):   -17.3 %                          2D Strain GLS Avg:     -17.1 %                           3D Volume EF:                          3D EF:        61 %                          LV EDV:       119 ml                          LV ESV:       47 ml                          LV SV:        72 ml RIGHT VENTRICLE            IVC RV Basal diam:  3.20 cm    IVC diam: 1.10 cm RV S prime:     9.36 cm/s TAPSE (M-mode): 2.1 cm RVSP:           33.2 mmHg LEFT ATRIUM             Index        RIGHT ATRIUM           Index LA diam:        4.40 cm 2.65 cm/m   RA  Pressure: 3.00 mmHg LA Vol (A2C):   31.6 ml 19.00 ml/m  RA Area:     12.40 cm LA Vol (A4C):   27.5 ml 16.56 ml/m  RA Volume:   28.20 ml  16.98 ml/m LA Biplane Vol: 38.9 ml 23.43 ml/m  AORTIC VALVE LVOT Vmax:   66.20 cm/s LVOT Vmean:  45.200 cm/s LVOT VTI:    0.162 m  AORTA Ao Root diam: 2.90 cm MITRAL VALVE                TRICUSPID VALVE MV Area (PHT): 3.89 cm     TR Peak grad:   30.2 mmHg MV Decel Time: 195 msec     TR Vmax:        275.00 cm/s MV E velocity: 101.00 cm/s  Estimated RAP:  3.00 mmHg MV A velocity: 27.20 cm/s   RVSP:           33.2 mmHg MV E/A ratio:  3.71                             SHUNTS                             Systemic VTI:  0.16 m                             Systemic Diam: 2.00 cm Sunit Tolia Electronically signed by Tessa Lerner Signature Date/Time: 06/27/2023/5:44:29 PM    Final     Pending Labs Unresulted Labs (From admission, onward)     Start     Ordered   06/28/23 2034  Urinalysis, Routine w reflex microscopic -Urine, Clean Catch  Once,   URGENT       Question:  Specimen Source  Answer:  Urine, Clean Catch   06/28/23 2034            Vitals/Pain Today's Vitals    06/28/23 2036 06/28/23 2037 06/28/23 2127 06/28/23 2208  BP:      Pulse:      Resp:      Temp:      TempSrc:      SpO2:      Weight:  60.8 kg    Height:  5\' 4"  (1.626 m)    PainSc: 7   7  6      Isolation Precautions No active isolations  Medications Medications  fentaNYL (SUBLIMAZE) injection 50 mcg (50 mcg Intravenous Given 06/28/23 2137)  ondansetron (ZOFRAN) injection 4 mg (4 mg Intravenous Given 06/28/23 2154)    Mobility Walks (normally)     Focused Assessments     R Recommendations: See Admitting Provider Note  Report given to:   Additional Notes:  Left hip fx

## 2023-06-28 NOTE — ED Triage Notes (Signed)
Patient BIB [facility] due to fall. Patient has obvious deformity to left leg. Pulses in tact. Patient denies hitting head or being on thinners. Patient takes 81mg  aspirin. Patient A&Ox4.

## 2023-06-28 NOTE — H&P (Incomplete)
History and Physical    [name] YSA:[id number] DOB: [birth year] DOA: 06/28/2023  PCP: Emilio Aspen, MD   Patient coming from: ***  Chief Complaint: ***  HPI: [name] is a pleasant 82 y.o. female with medical history significant for ***   ED Course: Upon arrival to the ED, patient is found to be ***  Review of Systems:  All other systems reviewed and apart from HPI, are negative.  Past Medical History:  Diagnosis Date  . Acute GI bleeding 05/13/2015  . Anxiety   . Arthritis   . Asthma   . Atrial fibrillation (HCC) 10/29/2017  . Bakers cyst, left   . Chronic kidney disease    stage 3  . Constipation   . Diabetes mellitus    type 2  . Dysrhythmia   . GERD (gastroesophageal reflux disease)   . Heart murmur   . History of cellulitis   . History of dizziness   . History of hiatal hernia    tiny 06/15/2016  . Hypertension   . Iron deficiency anemia   . Peripheral vascular disease (HCC)   . PONV (postoperative nausea and vomiting)   . Pulmonary nodule     Past Surgical History:  Procedure Laterality Date  . APPENDECTOMY  1952  . BIOPSY  10/16/2018   Procedure: BIOPSY;  Surgeon: Vida Rigger, MD;  Location: [facility] ENDOSCOPY;  Service: Endoscopy;;  . BIOPSY  11/26/2019   Procedure: BIOPSY;  Surgeon: Vida Rigger, MD;  Location: [facility];  Service: Endoscopy;;  . BIOPSY  07/14/2020   Procedure: BIOPSY;  Surgeon: Bernette Redbird, MD;  Location: [facility];  Service: Endoscopy;;  . CARDIOVERSION N/A 09/09/2020   Procedure: CARDIOVERSION;  Surgeon: Wendall Stade, MD;  Location: [facility] ENDOSCOPY;  Service: Cardiovascular;  Laterality: N/A;  . CARDIOVERSION N/A 11/18/2020   Procedure: CARDIOVERSION;  Surgeon: Little Ishikawa, MD;  Location: [facility] ENDOSCOPY;  Service: Cardiovascular;  Laterality: N/A;  . COLONOSCOPY W/ POLYPECTOMY  05/15/2013  . ESOPHAGOGASTRODUODENOSCOPY (EGD) WITH PROPOFOL N/A 06/15/2016   Procedure: ESOPHAGOGASTRODUODENOSCOPY  (EGD) WITH PROPOFOL;  Surgeon: Vida Rigger, MD;  Location: [facility];  Service: Endoscopy;  Laterality: N/A;  . ESOPHAGOGASTRODUODENOSCOPY (EGD) WITH PROPOFOL N/A 10/16/2018   Procedure: ESOPHAGOGASTRODUODENOSCOPY (EGD) WITH PROPOFOL;  Surgeon: Vida Rigger, MD;  Location: [facility];  Service: Endoscopy;  Laterality: N/A;  . ESOPHAGOGASTRODUODENOSCOPY (EGD) WITH PROPOFOL N/A 11/26/2019   Procedure: ESOPHAGOGASTRODUODENOSCOPY (EGD) WITH PROPOFOL;  Surgeon: Vida Rigger, MD;  Location: [facility];  Service: Endoscopy;  Laterality: N/A;  . ESOPHAGOGASTRODUODENOSCOPY (EGD) WITH PROPOFOL N/A 07/14/2020   Procedure: ESOPHAGOGASTRODUODENOSCOPY (EGD) WITH PROPOFOL;  Surgeon: Bernette Redbird, MD;  Location: [facility];  Service: Endoscopy;  Laterality: N/A;  . EYE SURGERY Bilateral    cataracts removed  . GI RADIOFREQUENCY ABLATION  10/16/2018   Procedure: GI RADIOFREQUENCY ABLATION;  Surgeon: Vida Rigger, MD;  Location: [facility];  Service: Endoscopy;;  . GI RADIOFREQUENCY ABLATION N/A 11/26/2019   Procedure: GI RADIOFREQUENCY ABLATION;  Surgeon: Vida Rigger, MD;  Location: [facility];  Service: Endoscopy;  Laterality: N/A;  . HAND SURGERY Left 2010   operated on index and ring finger  . HOT HEMOSTASIS N/A 07/14/2020   Procedure: HOT HEMOSTASIS (ARGON PLASMA COAGULATION/BICAP);  Surgeon: Bernette Redbird, MD;  Location: [facility] ENDOSCOPY;  Service: Endoscopy;  Laterality: N/A;  . INCONTINENCE SURGERY    . JOINT REPLACEMENT Right    knee  . KNEE ARTHROSCOPY  2003 2005   right knee  . KYPHOPLASTY N/A 01/29/2020  Exam: 06/27/2023 Medical Rec #:  [id number]        Height:       64.0 in Accession #:    [id number]       Weight:       136.0 lb Date of Birth:  [birth year]         BSA:          1.661 m Patient Age:    82 years         BP:           116/68 mmHg Patient Gender: F                HR:           60 bpm. Exam Location:  [address] Procedure: 2D Echo, 3D Echo, Cardiac Doppler, Color Doppler and Strain Analysis Indications:    R06.02 SOB  History:        Patient has prior history of Echocardiogram examinations, most                 recent 11/18/2020. HFpEF, CAD, S/p Watchman, Arrythmias:Atrial                 Fibrillation, Signs/Symptoms:Fatigue and Shortness of Breath;                 Risk Factors:Hypertension and Diabetes. Previous TEE revealed                 LVEF 60% moderate TR.  Sonographer:    Chanetta Marshall BA, RDCS Referring Phys: 52 TESSA N CONTE IMPRESSIONS  1. Left ventricular ejection fraction, by estimation, is 60 to 65%. The left ventricle has normal function. The left ventricle has no regional wall motion abnormalities. Left ventricular diastolic parameters were normal. The average left ventricular global longitudinal strain is -17.1 %. The global longitudinal strain is normal.  2. Right ventricular systolic function is normal. The right ventricular size is normal. There is normal pulmonary artery systolic pressure. The estimated right ventricular systolic pressure is 33.2 mmHg.  3. The mitral valve is degenerative. Trivial mitral valve regurgitation. No evidence of mitral stenosis.  4. The aortic valve is tricuspid. Aortic valve regurgitation is not visualized. Aortic valve sclerosis is present, with no evidence of aortic valve stenosis.  5. The inferior vena cava is normal in size with greater than 50% respiratory variability, suggesting right  atrial pressure of 3 mmHg. FINDINGS  Left Ventricle: Left ventricular ejection fraction, by estimation, is 60 to 65%. The left ventricle has normal function. The left ventricle has no regional wall motion abnormalities. The average left ventricular global longitudinal strain is -17.1 %. The global longitudinal strain is normal. The left ventricular internal cavity size was normal in size. There is no left ventricular hypertrophy. Left ventricular diastolic parameters were normal. Right Ventricle: The right ventricular size is normal. No increase in right ventricular wall thickness. Right ventricular systolic function is normal. There is normal pulmonary artery systolic pressure. The tricuspid regurgitant velocity is 2.75 m/s, and  with an assumed right atrial pressure of 3 mmHg, the estimated right ventricular systolic pressure is 33.2 mmHg. Left Atrium: Left atrial size was normal in size. Right Atrium: Right atrial size was normal in size. Pericardium: There is no evidence of pericardial effusion. Mitral Valve: The mitral valve is degenerative in appearance. There is mild thickening of the mitral valve leaflet(s). Normal mobility of the mitral valve leaflets. Mild mitral annular calcification. Trivial mitral valve regurgitation. No evidence of  Exam: 06/27/2023 Medical Rec #:  [id number]        Height:       64.0 in Accession #:    [id number]       Weight:       136.0 lb Date of Birth:  [birth year]         BSA:          1.661 m Patient Age:    82 years         BP:           116/68 mmHg Patient Gender: F                HR:           60 bpm. Exam Location:  [address] Procedure: 2D Echo, 3D Echo, Cardiac Doppler, Color Doppler and Strain Analysis Indications:    R06.02 SOB  History:        Patient has prior history of Echocardiogram examinations, most                 recent 11/18/2020. HFpEF, CAD, S/p Watchman, Arrythmias:Atrial                 Fibrillation, Signs/Symptoms:Fatigue and Shortness of Breath;                 Risk Factors:Hypertension and Diabetes. Previous TEE revealed                 LVEF 60% moderate TR.  Sonographer:    Chanetta Marshall BA, RDCS Referring Phys: 52 TESSA N CONTE IMPRESSIONS  1. Left ventricular ejection fraction, by estimation, is 60 to 65%. The left ventricle has normal function. The left ventricle has no regional wall motion abnormalities. Left ventricular diastolic parameters were normal. The average left ventricular global longitudinal strain is -17.1 %. The global longitudinal strain is normal.  2. Right ventricular systolic function is normal. The right ventricular size is normal. There is normal pulmonary artery systolic pressure. The estimated right ventricular systolic pressure is 33.2 mmHg.  3. The mitral valve is degenerative. Trivial mitral valve regurgitation. No evidence of mitral stenosis.  4. The aortic valve is tricuspid. Aortic valve regurgitation is not visualized. Aortic valve sclerosis is present, with no evidence of aortic valve stenosis.  5. The inferior vena cava is normal in size with greater than 50% respiratory variability, suggesting right  atrial pressure of 3 mmHg. FINDINGS  Left Ventricle: Left ventricular ejection fraction, by estimation, is 60 to 65%. The left ventricle has normal function. The left ventricle has no regional wall motion abnormalities. The average left ventricular global longitudinal strain is -17.1 %. The global longitudinal strain is normal. The left ventricular internal cavity size was normal in size. There is no left ventricular hypertrophy. Left ventricular diastolic parameters were normal. Right Ventricle: The right ventricular size is normal. No increase in right ventricular wall thickness. Right ventricular systolic function is normal. There is normal pulmonary artery systolic pressure. The tricuspid regurgitant velocity is 2.75 m/s, and  with an assumed right atrial pressure of 3 mmHg, the estimated right ventricular systolic pressure is 33.2 mmHg. Left Atrium: Left atrial size was normal in size. Right Atrium: Right atrial size was normal in size. Pericardium: There is no evidence of pericardial effusion. Mitral Valve: The mitral valve is degenerative in appearance. There is mild thickening of the mitral valve leaflet(s). Normal mobility of the mitral valve leaflets. Mild mitral annular calcification. Trivial mitral valve regurgitation. No evidence of  History and Physical    [name] YSA:[id number] DOB: [birth year] DOA: 06/28/2023  PCP: Emilio Aspen, MD   Patient coming from: ***  Chief Complaint: ***  HPI: [name] is a pleasant 82 y.o. female with medical history significant for ***   ED Course: Upon arrival to the ED, patient is found to be ***  Review of Systems:  All other systems reviewed and apart from HPI, are negative.  Past Medical History:  Diagnosis Date  . Acute GI bleeding 05/13/2015  . Anxiety   . Arthritis   . Asthma   . Atrial fibrillation (HCC) 10/29/2017  . Bakers cyst, left   . Chronic kidney disease    stage 3  . Constipation   . Diabetes mellitus    type 2  . Dysrhythmia   . GERD (gastroesophageal reflux disease)   . Heart murmur   . History of cellulitis   . History of dizziness   . History of hiatal hernia    tiny 06/15/2016  . Hypertension   . Iron deficiency anemia   . Peripheral vascular disease (HCC)   . PONV (postoperative nausea and vomiting)   . Pulmonary nodule     Past Surgical History:  Procedure Laterality Date  . APPENDECTOMY  1952  . BIOPSY  10/16/2018   Procedure: BIOPSY;  Surgeon: Vida Rigger, MD;  Location: [facility];  Service: Endoscopy;;  . BIOPSY  11/26/2019   Procedure: BIOPSY;  Surgeon: Vida Rigger, MD;  Location: [facility];  Service: Endoscopy;;  . BIOPSY  07/14/2020   Procedure: BIOPSY;  Surgeon: Bernette Redbird, MD;  Location: [facility];  Service: Endoscopy;;  . CARDIOVERSION N/A 09/09/2020   Procedure: CARDIOVERSION;  Surgeon: Wendall Stade, MD;  Location: [facility] ENDOSCOPY;  Service: Cardiovascular;  Laterality: N/A;  . CARDIOVERSION N/A 11/18/2020   Procedure: CARDIOVERSION;  Surgeon: Little Ishikawa, MD;  Location: [facility] ENDOSCOPY;  Service: Cardiovascular;  Laterality: N/A;  . COLONOSCOPY W/ POLYPECTOMY  05/15/2013  . ESOPHAGOGASTRODUODENOSCOPY (EGD) WITH PROPOFOL N/A 06/15/2016   Procedure: ESOPHAGOGASTRODUODENOSCOPY  (EGD) WITH PROPOFOL;  Surgeon: Vida Rigger, MD;  Location: [facility];  Service: Endoscopy;  Laterality: N/A;  . ESOPHAGOGASTRODUODENOSCOPY (EGD) WITH PROPOFOL N/A 10/16/2018   Procedure: ESOPHAGOGASTRODUODENOSCOPY (EGD) WITH PROPOFOL;  Surgeon: Vida Rigger, MD;  Location: [facility];  Service: Endoscopy;  Laterality: N/A;  . ESOPHAGOGASTRODUODENOSCOPY (EGD) WITH PROPOFOL N/A 11/26/2019   Procedure: ESOPHAGOGASTRODUODENOSCOPY (EGD) WITH PROPOFOL;  Surgeon: Vida Rigger, MD;  Location: [facility];  Service: Endoscopy;  Laterality: N/A;  . ESOPHAGOGASTRODUODENOSCOPY (EGD) WITH PROPOFOL N/A 07/14/2020   Procedure: ESOPHAGOGASTRODUODENOSCOPY (EGD) WITH PROPOFOL;  Surgeon: Bernette Redbird, MD;  Location: [facility];  Service: Endoscopy;  Laterality: N/A;  . EYE SURGERY Bilateral    cataracts removed  . GI RADIOFREQUENCY ABLATION  10/16/2018   Procedure: GI RADIOFREQUENCY ABLATION;  Surgeon: Vida Rigger, MD;  Location: [facility];  Service: Endoscopy;;  . GI RADIOFREQUENCY ABLATION N/A 11/26/2019   Procedure: GI RADIOFREQUENCY ABLATION;  Surgeon: Vida Rigger, MD;  Location: [facility];  Service: Endoscopy;  Laterality: N/A;  . HAND SURGERY Left 2010   operated on index and ring finger  . HOT HEMOSTASIS N/A 07/14/2020   Procedure: HOT HEMOSTASIS (ARGON PLASMA COAGULATION/BICAP);  Surgeon: Bernette Redbird, MD;  Location: [facility] ENDOSCOPY;  Service: Endoscopy;  Laterality: N/A;  . INCONTINENCE SURGERY    . JOINT REPLACEMENT Right    knee  . KNEE ARTHROSCOPY  2003 2005   right knee  . KYPHOPLASTY N/A 01/29/2020  History and Physical    [name] YSA:[id number] DOB: [birth year] DOA: 06/28/2023  PCP: Emilio Aspen, MD   Patient coming from: ***  Chief Complaint: ***  HPI: [name] is a pleasant 82 y.o. female with medical history significant for ***   ED Course: Upon arrival to the ED, patient is found to be ***  Review of Systems:  All other systems reviewed and apart from HPI, are negative.  Past Medical History:  Diagnosis Date  . Acute GI bleeding 05/13/2015  . Anxiety   . Arthritis   . Asthma   . Atrial fibrillation (HCC) 10/29/2017  . Bakers cyst, left   . Chronic kidney disease    stage 3  . Constipation   . Diabetes mellitus    type 2  . Dysrhythmia   . GERD (gastroesophageal reflux disease)   . Heart murmur   . History of cellulitis   . History of dizziness   . History of hiatal hernia    tiny 06/15/2016  . Hypertension   . Iron deficiency anemia   . Peripheral vascular disease (HCC)   . PONV (postoperative nausea and vomiting)   . Pulmonary nodule     Past Surgical History:  Procedure Laterality Date  . APPENDECTOMY  1952  . BIOPSY  10/16/2018   Procedure: BIOPSY;  Surgeon: Vida Rigger, MD;  Location: [facility];  Service: Endoscopy;;  . BIOPSY  11/26/2019   Procedure: BIOPSY;  Surgeon: Vida Rigger, MD;  Location: [facility];  Service: Endoscopy;;  . BIOPSY  07/14/2020   Procedure: BIOPSY;  Surgeon: Bernette Redbird, MD;  Location: [facility];  Service: Endoscopy;;  . CARDIOVERSION N/A 09/09/2020   Procedure: CARDIOVERSION;  Surgeon: Wendall Stade, MD;  Location: [facility] ENDOSCOPY;  Service: Cardiovascular;  Laterality: N/A;  . CARDIOVERSION N/A 11/18/2020   Procedure: CARDIOVERSION;  Surgeon: Little Ishikawa, MD;  Location: [facility] ENDOSCOPY;  Service: Cardiovascular;  Laterality: N/A;  . COLONOSCOPY W/ POLYPECTOMY  05/15/2013  . ESOPHAGOGASTRODUODENOSCOPY (EGD) WITH PROPOFOL N/A 06/15/2016   Procedure: ESOPHAGOGASTRODUODENOSCOPY  (EGD) WITH PROPOFOL;  Surgeon: Vida Rigger, MD;  Location: [facility];  Service: Endoscopy;  Laterality: N/A;  . ESOPHAGOGASTRODUODENOSCOPY (EGD) WITH PROPOFOL N/A 10/16/2018   Procedure: ESOPHAGOGASTRODUODENOSCOPY (EGD) WITH PROPOFOL;  Surgeon: Vida Rigger, MD;  Location: [facility];  Service: Endoscopy;  Laterality: N/A;  . ESOPHAGOGASTRODUODENOSCOPY (EGD) WITH PROPOFOL N/A 11/26/2019   Procedure: ESOPHAGOGASTRODUODENOSCOPY (EGD) WITH PROPOFOL;  Surgeon: Vida Rigger, MD;  Location: [facility];  Service: Endoscopy;  Laterality: N/A;  . ESOPHAGOGASTRODUODENOSCOPY (EGD) WITH PROPOFOL N/A 07/14/2020   Procedure: ESOPHAGOGASTRODUODENOSCOPY (EGD) WITH PROPOFOL;  Surgeon: Bernette Redbird, MD;  Location: [facility];  Service: Endoscopy;  Laterality: N/A;  . EYE SURGERY Bilateral    cataracts removed  . GI RADIOFREQUENCY ABLATION  10/16/2018   Procedure: GI RADIOFREQUENCY ABLATION;  Surgeon: Vida Rigger, MD;  Location: [facility];  Service: Endoscopy;;  . GI RADIOFREQUENCY ABLATION N/A 11/26/2019   Procedure: GI RADIOFREQUENCY ABLATION;  Surgeon: Vida Rigger, MD;  Location: [facility];  Service: Endoscopy;  Laterality: N/A;  . HAND SURGERY Left 2010   operated on index and ring finger  . HOT HEMOSTASIS N/A 07/14/2020   Procedure: HOT HEMOSTASIS (ARGON PLASMA COAGULATION/BICAP);  Surgeon: Bernette Redbird, MD;  Location: [facility] ENDOSCOPY;  Service: Endoscopy;  Laterality: N/A;  . INCONTINENCE SURGERY    . JOINT REPLACEMENT Right    knee  . KNEE ARTHROSCOPY  2003 2005   right knee  . KYPHOPLASTY N/A 01/29/2020  History and Physical    [name] YSA:[id number] DOB: [birth year] DOA: 06/28/2023  PCP: Emilio Aspen, MD   Patient coming from: ***  Chief Complaint: ***  HPI: [name] is a pleasant 82 y.o. female with medical history significant for ***   ED Course: Upon arrival to the ED, patient is found to be ***  Review of Systems:  All other systems reviewed and apart from HPI, are negative.  Past Medical History:  Diagnosis Date  . Acute GI bleeding 05/13/2015  . Anxiety   . Arthritis   . Asthma   . Atrial fibrillation (HCC) 10/29/2017  . Bakers cyst, left   . Chronic kidney disease    stage 3  . Constipation   . Diabetes mellitus    type 2  . Dysrhythmia   . GERD (gastroesophageal reflux disease)   . Heart murmur   . History of cellulitis   . History of dizziness   . History of hiatal hernia    tiny 06/15/2016  . Hypertension   . Iron deficiency anemia   . Peripheral vascular disease (HCC)   . PONV (postoperative nausea and vomiting)   . Pulmonary nodule     Past Surgical History:  Procedure Laterality Date  . APPENDECTOMY  1952  . BIOPSY  10/16/2018   Procedure: BIOPSY;  Surgeon: Vida Rigger, MD;  Location: [facility];  Service: Endoscopy;;  . BIOPSY  11/26/2019   Procedure: BIOPSY;  Surgeon: Vida Rigger, MD;  Location: [facility];  Service: Endoscopy;;  . BIOPSY  07/14/2020   Procedure: BIOPSY;  Surgeon: Bernette Redbird, MD;  Location: [facility];  Service: Endoscopy;;  . CARDIOVERSION N/A 09/09/2020   Procedure: CARDIOVERSION;  Surgeon: Wendall Stade, MD;  Location: [facility] ENDOSCOPY;  Service: Cardiovascular;  Laterality: N/A;  . CARDIOVERSION N/A 11/18/2020   Procedure: CARDIOVERSION;  Surgeon: Little Ishikawa, MD;  Location: [facility] ENDOSCOPY;  Service: Cardiovascular;  Laterality: N/A;  . COLONOSCOPY W/ POLYPECTOMY  05/15/2013  . ESOPHAGOGASTRODUODENOSCOPY (EGD) WITH PROPOFOL N/A 06/15/2016   Procedure: ESOPHAGOGASTRODUODENOSCOPY  (EGD) WITH PROPOFOL;  Surgeon: Vida Rigger, MD;  Location: [facility];  Service: Endoscopy;  Laterality: N/A;  . ESOPHAGOGASTRODUODENOSCOPY (EGD) WITH PROPOFOL N/A 10/16/2018   Procedure: ESOPHAGOGASTRODUODENOSCOPY (EGD) WITH PROPOFOL;  Surgeon: Vida Rigger, MD;  Location: [facility];  Service: Endoscopy;  Laterality: N/A;  . ESOPHAGOGASTRODUODENOSCOPY (EGD) WITH PROPOFOL N/A 11/26/2019   Procedure: ESOPHAGOGASTRODUODENOSCOPY (EGD) WITH PROPOFOL;  Surgeon: Vida Rigger, MD;  Location: [facility];  Service: Endoscopy;  Laterality: N/A;  . ESOPHAGOGASTRODUODENOSCOPY (EGD) WITH PROPOFOL N/A 07/14/2020   Procedure: ESOPHAGOGASTRODUODENOSCOPY (EGD) WITH PROPOFOL;  Surgeon: Bernette Redbird, MD;  Location: [facility];  Service: Endoscopy;  Laterality: N/A;  . EYE SURGERY Bilateral    cataracts removed  . GI RADIOFREQUENCY ABLATION  10/16/2018   Procedure: GI RADIOFREQUENCY ABLATION;  Surgeon: Vida Rigger, MD;  Location: [facility];  Service: Endoscopy;;  . GI RADIOFREQUENCY ABLATION N/A 11/26/2019   Procedure: GI RADIOFREQUENCY ABLATION;  Surgeon: Vida Rigger, MD;  Location: [facility];  Service: Endoscopy;  Laterality: N/A;  . HAND SURGERY Left 2010   operated on index and ring finger  . HOT HEMOSTASIS N/A 07/14/2020   Procedure: HOT HEMOSTASIS (ARGON PLASMA COAGULATION/BICAP);  Surgeon: Bernette Redbird, MD;  Location: [facility] ENDOSCOPY;  Service: Endoscopy;  Laterality: N/A;  . INCONTINENCE SURGERY    . JOINT REPLACEMENT Right    knee  . KNEE ARTHROSCOPY  2003 2005   right knee  . KYPHOPLASTY N/A 01/29/2020  History and Physical    [name] YSA:[id number] DOB: [birth year] DOA: 06/28/2023  PCP: Emilio Aspen, MD   Patient coming from: ***  Chief Complaint: ***  HPI: [name] is a pleasant 82 y.o. female with medical history significant for ***   ED Course: Upon arrival to the ED, patient is found to be ***  Review of Systems:  All other systems reviewed and apart from HPI, are negative.  Past Medical History:  Diagnosis Date  . Acute GI bleeding 05/13/2015  . Anxiety   . Arthritis   . Asthma   . Atrial fibrillation (HCC) 10/29/2017  . Bakers cyst, left   . Chronic kidney disease    stage 3  . Constipation   . Diabetes mellitus    type 2  . Dysrhythmia   . GERD (gastroesophageal reflux disease)   . Heart murmur   . History of cellulitis   . History of dizziness   . History of hiatal hernia    tiny 06/15/2016  . Hypertension   . Iron deficiency anemia   . Peripheral vascular disease (HCC)   . PONV (postoperative nausea and vomiting)   . Pulmonary nodule     Past Surgical History:  Procedure Laterality Date  . APPENDECTOMY  1952  . BIOPSY  10/16/2018   Procedure: BIOPSY;  Surgeon: Vida Rigger, MD;  Location: [facility];  Service: Endoscopy;;  . BIOPSY  11/26/2019   Procedure: BIOPSY;  Surgeon: Vida Rigger, MD;  Location: [facility];  Service: Endoscopy;;  . BIOPSY  07/14/2020   Procedure: BIOPSY;  Surgeon: Bernette Redbird, MD;  [facility]: [facility];  Service: Endoscopy;;  . CARDIOVERSION N/A 09/09/2020   Procedure: CARDIOVERSION;  Surgeon: Wendall Stade, MD;  Location: [facility] ENDOSCOPY;  Service: Cardiovascular;  Laterality: N/A;  . CARDIOVERSION N/A 11/18/2020   Procedure: CARDIOVERSION;  Surgeon: Little Ishikawa, MD;  Location: [facility] ENDOSCOPY;  Service: Cardiovascular;  Laterality: N/A;  . COLONOSCOPY W/ POLYPECTOMY  05/15/2013  . ESOPHAGOGASTRODUODENOSCOPY (EGD) WITH PROPOFOL N/A 06/15/2016   Procedure: ESOPHAGOGASTRODUODENOSCOPY  (EGD) WITH PROPOFOL;  Surgeon: Vida Rigger, MD;  [facility]: [facility];  Service: Endoscopy;  Laterality: N/A;  . ESOPHAGOGASTRODUODENOSCOPY (EGD) WITH PROPOFOL N/A 10/16/2018   Procedure: ESOPHAGOGASTRODUODENOSCOPY (EGD) WITH PROPOFOL;  Surgeon: Vida Rigger, MD;  Location: [facility];  Service: Endoscopy;  Laterality: N/A;  . ESOPHAGOGASTRODUODENOSCOPY (EGD) WITH PROPOFOL N/A 11/26/2019   Procedure: ESOPHAGOGASTRODUODENOSCOPY (EGD) WITH PROPOFOL;  Surgeon: Vida Rigger, MD;  Location: [facility];  Service: Endoscopy;  Laterality: N/A;  . ESOPHAGOGASTRODUODENOSCOPY (EGD) WITH PROPOFOL N/A 07/14/2020   Procedure: ESOPHAGOGASTRODUODENOSCOPY (EGD) WITH PROPOFOL;  Surgeon: Bernette Redbird, MD;  Location: [facility];  Service: Endoscopy;  Laterality: N/A;  . EYE SURGERY Bilateral    cataracts removed  . GI RADIOFREQUENCY ABLATION  10/16/2018   Procedure: GI RADIOFREQUENCY ABLATION;  Surgeon: Vida Rigger, MD;  Location: [facility];  Service: Endoscopy;;  . GI RADIOFREQUENCY ABLATION N/A 11/26/2019   Procedure: GI RADIOFREQUENCY ABLATION;  Surgeon: Vida Rigger, MD;  Location: [facility];  Service: Endoscopy;  Laterality: N/A;  . HAND SURGERY Left 2010   operated on index and ring finger  . HOT HEMOSTASIS N/A 07/14/2020   Procedure: HOT HEMOSTASIS (ARGON PLASMA COAGULATION/BICAP);  Surgeon: Bernette Redbird, MD;  Location: [facility] ENDOSCOPY;  Service: Endoscopy;  Laterality: N/A;  . INCONTINENCE SURGERY    . JOINT REPLACEMENT Right    knee  . KNEE ARTHROSCOPY  2003 2005   right knee  . KYPHOPLASTY N/A 01/29/2020  History and Physical    [name] YSA:[id number] DOB: [birth year] DOA: 06/28/2023  PCP: Emilio Aspen, MD   Patient coming from: ***  Chief Complaint: ***  HPI: [name] is a pleasant 82 y.o. female with medical history significant for ***   ED Course: Upon arrival to the ED, patient is found to be ***  Review of Systems:  All other systems reviewed and apart from HPI, are negative.  Past Medical History:  Diagnosis Date  . Acute GI bleeding 05/13/2015  . Anxiety   . Arthritis   . Asthma   . Atrial fibrillation (HCC) 10/29/2017  . Bakers cyst, left   . Chronic kidney disease    stage 3  . Constipation   . Diabetes mellitus    type 2  . Dysrhythmia   . GERD (gastroesophageal reflux disease)   . Heart murmur   . History of cellulitis   . History of dizziness   . History of hiatal hernia    tiny 06/15/2016  . Hypertension   . Iron deficiency anemia   . Peripheral vascular disease (HCC)   . PONV (postoperative nausea and vomiting)   . Pulmonary nodule     Past Surgical History:  Procedure Laterality Date  . APPENDECTOMY  1952  . BIOPSY  10/16/2018   Procedure: BIOPSY;  Surgeon: Vida Rigger, MD;  Location: [facility];  Service: Endoscopy;;  . BIOPSY  11/26/2019   Procedure: BIOPSY;  Surgeon: Vida Rigger, MD;  Location: [facility];  Service: Endoscopy;;  . BIOPSY  07/14/2020   Procedure: BIOPSY;  Surgeon: Bernette Redbird, MD;  Location: [facility];  Service: Endoscopy;;  . CARDIOVERSION N/A 09/09/2020   Procedure: CARDIOVERSION;  Surgeon: Wendall Stade, MD;  Location: [facility] ENDOSCOPY;  Service: Cardiovascular;  Laterality: N/A;  . CARDIOVERSION N/A 11/18/2020   Procedure: CARDIOVERSION;  Surgeon: Little Ishikawa, MD;  Location: [facility] ENDOSCOPY;  Service: Cardiovascular;  Laterality: N/A;  . COLONOSCOPY W/ POLYPECTOMY  05/15/2013  . ESOPHAGOGASTRODUODENOSCOPY (EGD) WITH PROPOFOL N/A 06/15/2016   Procedure: ESOPHAGOGASTRODUODENOSCOPY  (EGD) WITH PROPOFOL;  Surgeon: Vida Rigger, MD;  Location: [facility];  Service: Endoscopy;  Laterality: N/A;  . ESOPHAGOGASTRODUODENOSCOPY (EGD) WITH PROPOFOL N/A 10/16/2018   Procedure: ESOPHAGOGASTRODUODENOSCOPY (EGD) WITH PROPOFOL;  Surgeon: Vida Rigger, MD;  Location: [facility];  Service: Endoscopy;  Laterality: N/A;  . ESOPHAGOGASTRODUODENOSCOPY (EGD) WITH PROPOFOL N/A 11/26/2019   Procedure: ESOPHAGOGASTRODUODENOSCOPY (EGD) WITH PROPOFOL;  Surgeon: Vida Rigger, MD;  Location: [facility];  Service: Endoscopy;  Laterality: N/A;  . ESOPHAGOGASTRODUODENOSCOPY (EGD) WITH PROPOFOL N/A 07/14/2020   Procedure: ESOPHAGOGASTRODUODENOSCOPY (EGD) WITH PROPOFOL;  Surgeon: Bernette Redbird, MD;  Location: [facility];  Service: Endoscopy;  Laterality: N/A;  . EYE SURGERY Bilateral    cataracts removed  . GI RADIOFREQUENCY ABLATION  10/16/2018   Procedure: GI RADIOFREQUENCY ABLATION;  Surgeon: Vida Rigger, MD;  Location: [facility];  Service: Endoscopy;;  . GI RADIOFREQUENCY ABLATION N/A 11/26/2019   Procedure: GI RADIOFREQUENCY ABLATION;  Surgeon: Vida Rigger, MD;  Location: [facility];  Service: Endoscopy;  Laterality: N/A;  . HAND SURGERY Left 2010   operated on index and ring finger  . HOT HEMOSTASIS N/A 07/14/2020   Procedure: HOT HEMOSTASIS (ARGON PLASMA COAGULATION/BICAP);  Surgeon: Bernette Redbird, MD;  Location: [facility];  Service: Endoscopy;  Laterality: N/A;  . INCONTINENCE SURGERY    . JOINT REPLACEMENT Right    knee  . KNEE ARTHROSCOPY  2003 2005   right knee  . KYPHOPLASTY N/A 01/29/2020

## 2023-06-28 NOTE — ED Notes (Signed)
Patient transported to CT 

## 2023-06-29 ENCOUNTER — Encounter (HOSPITAL_COMMUNITY): Payer: Self-pay | Admitting: Family Medicine

## 2023-06-29 ENCOUNTER — Encounter (HOSPITAL_COMMUNITY)
Admission: EM | Disposition: A | Discharge status: Skilled Nursing Facility | Payer: Self-pay | Source: Home / Self Care | Attending: Internal Medicine

## 2023-06-29 ENCOUNTER — Other Ambulatory Visit: Payer: Self-pay

## 2023-06-29 ENCOUNTER — Inpatient Hospital Stay (HOSPITAL_COMMUNITY): Payer: Medicare PPO

## 2023-06-29 ENCOUNTER — Inpatient Hospital Stay (HOSPITAL_COMMUNITY): Payer: Medicare PPO | Admitting: Anesthesiology

## 2023-06-29 DIAGNOSIS — I2510 Atherosclerotic heart disease of native coronary artery without angina pectoris: Secondary | ICD-10-CM

## 2023-06-29 DIAGNOSIS — S72002A Fracture of unspecified part of neck of left femur, initial encounter for closed fracture: Secondary | ICD-10-CM | POA: Diagnosis not present

## 2023-06-29 DIAGNOSIS — S72142A Displaced intertrochanteric fracture of left femur, initial encounter for closed fracture: Secondary | ICD-10-CM

## 2023-06-29 HISTORY — PX: INTRAMEDULLARY (IM) NAIL INTERTROCHANTERIC: SHX5875

## 2023-06-29 LAB — CBC
HCT: 43.7 % (ref 36.0–46.0)
Hemoglobin: 14.2 g/dL (ref 12.0–15.0)
MCH: 29.8 pg (ref 26.0–34.0)
MCHC: 32.5 g/dL (ref 30.0–36.0)
MCV: 91.6 fL (ref 80.0–100.0)
Platelets: 257 10*3/uL (ref 150–400)
RBC: 4.77 MIL/uL (ref 3.87–5.11)
RDW: 13.3 % (ref 11.5–15.5)
WBC: 12.6 10*3/uL — ABNORMAL HIGH (ref 4.0–10.5)
nRBC: 0 % (ref 0.0–0.2)

## 2023-06-29 LAB — HEMOGLOBIN A1C
Hgb A1c MFr Bld: 6.3 % — ABNORMAL HIGH (ref 4.8–5.6)
Mean Plasma Glucose: 134.11 mg/dL

## 2023-06-29 LAB — GLUCOSE, CAPILLARY
Glucose-Capillary: 158 mg/dL — ABNORMAL HIGH (ref 70–99)
Glucose-Capillary: 179 mg/dL — ABNORMAL HIGH (ref 70–99)
Glucose-Capillary: 179 mg/dL — ABNORMAL HIGH (ref 70–99)
Glucose-Capillary: 189 mg/dL — ABNORMAL HIGH (ref 70–99)
Glucose-Capillary: 189 mg/dL — ABNORMAL HIGH (ref 70–99)
Glucose-Capillary: 192 mg/dL — ABNORMAL HIGH (ref 70–99)
Glucose-Capillary: 206 mg/dL — ABNORMAL HIGH (ref 70–99)
Glucose-Capillary: 239 mg/dL — ABNORMAL HIGH (ref 70–99)
Glucose-Capillary: 261 mg/dL — ABNORMAL HIGH (ref 70–99)

## 2023-06-29 LAB — BASIC METABOLIC PANEL
Anion gap: 9 (ref 5–15)
BUN: 19 mg/dL (ref 8–23)
CO2: 32 mmol/L (ref 22–32)
Calcium: 9.1 mg/dL (ref 8.9–10.3)
Chloride: 98 mmol/L (ref 98–111)
Creatinine, Ser: 1.15 mg/dL — ABNORMAL HIGH (ref 0.44–1.00)
GFR, Estimated: 48 mL/min — ABNORMAL LOW (ref >60)
Glucose, Bld: 182 mg/dL — ABNORMAL HIGH (ref 70–99)
Potassium: 4.6 mmol/L (ref 3.5–5.1)
Sodium: 139 mmol/L (ref 135–145)

## 2023-06-29 LAB — MRSA NEXT GEN BY PCR, NASAL: MRSA by PCR Next Gen: NOT DETECTED

## 2023-06-29 SURGERY — FIXATION, FRACTURE, INTERTROCHANTERIC, WITH INTRAMEDULLARY ROD
Anesthesia: General | Laterality: Left

## 2023-06-29 MED ORDER — LACTATED RINGERS IV SOLN: INTRAVENOUS | Status: DC | PRN

## 2023-06-29 MED ORDER — PROPOFOL 500 MG/50ML IV EMUL
INTRAVENOUS | Status: DC | PRN
  Administered 2023-06-29: 55 ug via INTRAVENOUS

## 2023-06-29 MED ORDER — OXYCODONE HCL 5 MG PO TABS: 5.0000 mg | ORAL_TABLET | Freq: Once | ORAL | Status: DC | PRN

## 2023-06-29 MED ORDER — ENSURE ENLIVE PO LIQD
237.0000 mL | Freq: Two times a day (BID) | ORAL | Status: DC
  Administered 2023-06-30 – 2023-07-03 (×6): 237 mL via ORAL

## 2023-06-29 MED ORDER — POVIDONE-IODINE 10 % EX SWAB
2.0000 | Freq: Once | CUTANEOUS | Status: AC
Start: 2023-06-29 — End: 2023-06-29
  Administered 2023-06-29: 2 via TOPICAL

## 2023-06-29 MED ORDER — ORAL CARE MOUTH RINSE: 15.0000 mL | Freq: Once | OROMUCOSAL | Status: AC

## 2023-06-29 MED ORDER — CEFAZOLIN SODIUM-DEXTROSE 2-4 GM/100ML-% IV SOLN
2.0000 g | INTRAVENOUS | Status: AC
  Administered 2023-06-29: 2 g via INTRAVENOUS
  Filled 2023-06-29: qty 100

## 2023-06-29 MED ORDER — LIDOCAINE 2% (20 MG/ML) 5 ML SYRINGE
INTRAMUSCULAR | Status: DC | PRN
  Administered 2023-06-29: 60 mg via INTRAVENOUS

## 2023-06-29 MED ORDER — INSULIN ASPART 100 UNIT/ML IJ SOLN
2.0000 [IU] | Freq: Once | INTRAMUSCULAR | Status: AC
  Administered 2023-06-29: 2 [IU] via SUBCUTANEOUS

## 2023-06-29 MED ORDER — CHLORHEXIDINE GLUCONATE 0.12 % MT SOLN
15.0000 mL | Freq: Once | OROMUCOSAL | Status: AC
  Administered 2023-06-29: 15 mL via OROMUCOSAL

## 2023-06-29 MED ORDER — TRANEXAMIC ACID-NACL 1000-0.7 MG/100ML-% IV SOLN
1000.0000 mg | Freq: Once | INTRAVENOUS | Status: AC
  Administered 2023-06-29: 1000 mg via INTRAVENOUS
  Filled 2023-06-29: qty 100

## 2023-06-29 MED ORDER — FENTANYL CITRATE (PF) 100 MCG/2ML IJ SOLN: 25.0000 ug | INTRAMUSCULAR | Status: DC | PRN

## 2023-06-29 MED ORDER — GUAIFENESIN-DM 100-10 MG/5ML PO SYRP
5.0000 mL | ORAL_SOLUTION | ORAL | Status: DC | PRN
  Administered 2023-06-29 – 2023-07-02 (×6): 5 mL via ORAL
  Filled 2023-06-29 (×6): qty 10

## 2023-06-29 MED ORDER — FENTANYL CITRATE (PF) 100 MCG/2ML IJ SOLN
INTRAMUSCULAR | Status: AC
  Administered 2023-06-29: 25 ug via INTRAVENOUS
  Filled 2023-06-29: qty 2

## 2023-06-29 MED ORDER — ONDANSETRON HCL 4 MG/2ML IJ SOLN
4.0000 mg | Freq: Once | INTRAMUSCULAR | Status: AC
  Administered 2023-06-29: 4 mg via INTRAVENOUS

## 2023-06-29 MED ORDER — ALBUTEROL SULFATE HFA 108 (90 BASE) MCG/ACT IN AERS
INHALATION_SPRAY | RESPIRATORY_TRACT | Status: DC | PRN
  Administered 2023-06-29 (×2): 2 via RESPIRATORY_TRACT

## 2023-06-29 MED ORDER — METOCLOPRAMIDE HCL 5 MG/ML IJ SOLN
5.0000 mg | Freq: Three times a day (TID) | INTRAMUSCULAR | Status: DC | PRN
  Administered 2023-06-29: 10 mg via INTRAVENOUS
  Filled 2023-06-29: qty 2

## 2023-06-29 MED ORDER — DEXAMETHASONE SODIUM PHOSPHATE 10 MG/ML IJ SOLN
INTRAMUSCULAR | Status: DC | PRN
  Administered 2023-06-29: 10 mg via INTRAVENOUS

## 2023-06-29 MED ORDER — SUGAMMADEX SODIUM 200 MG/2ML IV SOLN
INTRAVENOUS | Status: DC | PRN
  Administered 2023-06-29: 160 mg via INTRAVENOUS

## 2023-06-29 MED ORDER — ASPIRIN 325 MG PO TABS
325.0000 mg | ORAL_TABLET | Freq: Every day | ORAL | Status: DC
  Administered 2023-06-30: 325 mg via ORAL
  Filled 2023-06-29: qty 1

## 2023-06-29 MED ORDER — ONDANSETRON HCL 4 MG/2ML IJ SOLN
INTRAMUSCULAR | Status: AC
  Filled 2023-06-29: qty 2

## 2023-06-29 MED ORDER — ROCURONIUM BROMIDE 10 MG/ML (PF) SYRINGE
PREFILLED_SYRINGE | INTRAVENOUS | Status: DC | PRN
  Administered 2023-06-29: 80 mg via INTRAVENOUS

## 2023-06-29 MED ORDER — ONDANSETRON HCL 4 MG/2ML IJ SOLN: 4.0000 mg | Freq: Once | INTRAMUSCULAR | Status: DC | PRN

## 2023-06-29 MED ORDER — OXYCODONE HCL 5 MG/5ML PO SOLN: 5.0000 mg | Freq: Once | ORAL | Status: DC | PRN

## 2023-06-29 MED ORDER — ACETAMINOPHEN 160 MG/5ML PO SOLN: 325.0000 mg | ORAL | Status: DC | PRN

## 2023-06-29 MED ORDER — CEFAZOLIN SODIUM-DEXTROSE 2-4 GM/100ML-% IV SOLN
2.0000 g | Freq: Three times a day (TID) | INTRAVENOUS | Status: AC
  Administered 2023-06-29 – 2023-06-30 (×3): 2 g via INTRAVENOUS
  Filled 2023-06-29 (×3): qty 100

## 2023-06-29 MED ORDER — ONDANSETRON HCL 4 MG/2ML IJ SOLN: 4.0000 mg | Freq: Once | INTRAMUSCULAR | Status: AC

## 2023-06-29 MED ORDER — PHENOL 1.4 % MT LIQD
1.0000 | OROMUCOSAL | Status: DC | PRN
  Administered 2023-06-29: 1 via OROMUCOSAL
  Filled 2023-06-29: qty 177

## 2023-06-29 MED ORDER — VANCOMYCIN HCL 1000 MG IV SOLR
INTRAVENOUS | Status: AC
  Filled 2023-06-29: qty 20

## 2023-06-29 MED ORDER — ONDANSETRON HCL 4 MG/2ML IJ SOLN
INTRAMUSCULAR | Status: DC | PRN
  Administered 2023-06-29: 4 mg via INTRAVENOUS

## 2023-06-29 MED ORDER — ADULT MULTIVITAMIN W/MINERALS CH
1.0000 | ORAL_TABLET | Freq: Every day | ORAL | Status: DC
  Administered 2023-06-30 – 2023-07-03 (×4): 1 via ORAL
  Filled 2023-06-29 (×4): qty 1

## 2023-06-29 MED ORDER — ACETAMINOPHEN 500 MG PO TABS
1000.0000 mg | ORAL_TABLET | Freq: Three times a day (TID) | ORAL | Status: DC | PRN
  Administered 2023-06-29 – 2023-07-02 (×3): 1000 mg via ORAL
  Filled 2023-06-29 (×3): qty 2

## 2023-06-29 MED ORDER — METOCLOPRAMIDE HCL 5 MG PO TABS
5.0000 mg | ORAL_TABLET | Freq: Three times a day (TID) | ORAL | Status: DC | PRN
  Filled 2023-06-29: qty 2

## 2023-06-29 MED ORDER — ACETAMINOPHEN 325 MG PO TABS: 325.0000 mg | ORAL_TABLET | ORAL | Status: DC | PRN

## 2023-06-29 MED ORDER — EPHEDRINE SULFATE-NACL 50-0.9 MG/10ML-% IV SOSY
PREFILLED_SYRINGE | INTRAVENOUS | Status: DC | PRN
  Administered 2023-06-29: 10 mg via INTRAVENOUS

## 2023-06-29 MED ORDER — DOCUSATE SODIUM 100 MG PO CAPS
100.0000 mg | ORAL_CAPSULE | Freq: Two times a day (BID) | ORAL | Status: DC
  Administered 2023-06-29 – 2023-07-03 (×8): 100 mg via ORAL
  Filled 2023-06-29 (×8): qty 1

## 2023-06-29 MED ORDER — AMISULPRIDE (ANTIEMETIC) 5 MG/2ML IV SOLN
INTRAVENOUS | Status: AC
  Filled 2023-06-29: qty 4

## 2023-06-29 MED ORDER — HYDROMORPHONE HCL 1 MG/ML IJ SOLN
INTRAMUSCULAR | Status: AC
  Filled 2023-06-29: qty 0.5

## 2023-06-29 MED ORDER — PROPOFOL 10 MG/ML IV BOLUS
INTRAVENOUS | Status: DC | PRN
  Administered 2023-06-29: 90 mg via INTRAVENOUS

## 2023-06-29 MED ORDER — CHLORHEXIDINE GLUCONATE 4 % EX SOLN
60.0000 mL | Freq: Once | CUTANEOUS | Status: AC
Start: 2023-06-29 — End: 2023-06-29
  Administered 2023-06-29: 4 via TOPICAL
  Filled 2023-06-29: qty 15

## 2023-06-29 MED ORDER — FENTANYL CITRATE (PF) 100 MCG/2ML IJ SOLN: 25.0000 ug | Freq: Once | INTRAMUSCULAR | Status: AC

## 2023-06-29 MED ORDER — PROCHLORPERAZINE EDISYLATE 10 MG/2ML IJ SOLN
10.0000 mg | Freq: Four times a day (QID) | INTRAMUSCULAR | Status: DC | PRN
Start: 2023-06-29 — End: 2023-07-03

## 2023-06-29 MED ORDER — HYDROMORPHONE HCL 1 MG/ML IJ SOLN
INTRAMUSCULAR | Status: DC | PRN
  Administered 2023-06-29: .25 mg via INTRAVENOUS

## 2023-06-29 MED ORDER — 0.9 % SODIUM CHLORIDE (POUR BTL) OPTIME
TOPICAL | Status: DC | PRN
  Administered 2023-06-29: 1000 mL

## 2023-06-29 MED ORDER — MEPERIDINE HCL 25 MG/ML IJ SOLN: 6.2500 mg | INTRAMUSCULAR | Status: DC | PRN

## 2023-06-29 MED ORDER — DIPHENHYDRAMINE HCL 12.5 MG/5ML PO ELIX
12.5000 mg | ORAL_SOLUTION | ORAL | Status: DC | PRN
  Administered 2023-07-01: 25 mg via ORAL
  Filled 2023-06-29: qty 10

## 2023-06-29 MED ORDER — AMISULPRIDE (ANTIEMETIC) 5 MG/2ML IV SOLN
10.0000 mg | Freq: Once | INTRAVENOUS | Status: AC
  Administered 2023-06-29: 10 mg via INTRAVENOUS

## 2023-06-29 MED ORDER — ONDANSETRON HCL 4 MG/2ML IJ SOLN
INTRAMUSCULAR | Status: AC
  Administered 2023-06-29: 4 mg via INTRAVENOUS
  Filled 2023-06-29: qty 2

## 2023-06-29 SURGICAL SUPPLY — 51 items
ADH SKN CLS APL DERMABOND .7 (GAUZE/BANDAGES/DRESSINGS) ×1
APL PRP STRL LF DISP 70% ISPRP (MISCELLANEOUS)
BAG COUNTER SPONGE SURGICOUNT (BAG) IMPLANT
BAG SPNG CNTER NS LX DISP (BAG)
BIT DRILL INTERTAN LAG SCREW (BIT) IMPLANT
BIT DRILL SHORT 4.0 (BIT) IMPLANT
BRUSH SCRUB EZ PLAIN DRY (MISCELLANEOUS) ×2 IMPLANT
CHLORAPREP W/TINT 26 (MISCELLANEOUS) ×1 IMPLANT
COVER PERINEAL POST (MISCELLANEOUS) ×1 IMPLANT
COVER SURGICAL LIGHT HANDLE (MISCELLANEOUS) ×1 IMPLANT
DERMABOND ADVANCED .7 DNX12 (GAUZE/BANDAGES/DRESSINGS) ×1 IMPLANT
DRAPE C-ARM 35X43 STRL (DRAPES) ×1 IMPLANT
DRAPE IMP U-DRAPE 54X76 (DRAPES) ×2 IMPLANT
DRAPE INCISE IOBAN 66X45 STRL (DRAPES) ×1 IMPLANT
DRAPE STERI IOBAN 125X83 (DRAPES) ×1 IMPLANT
DRAPE SURG 17X23 STRL (DRAPES) ×2 IMPLANT
DRAPE U-SHAPE 47X51 STRL (DRAPES) ×1 IMPLANT
DRESSING MEPILEX FLEX 4X4 (GAUZE/BANDAGES/DRESSINGS) ×1 IMPLANT
DRILL BIT SHORT 4.0 (BIT) ×1
DRSG MEPILEX FLEX 4X4 (GAUZE/BANDAGES/DRESSINGS) ×3
DRSG MEPILEX POST OP 4X8 (GAUZE/BANDAGES/DRESSINGS) ×1 IMPLANT
ELECT REM PT RETURN 9FT ADLT (ELECTROSURGICAL) ×1
ELECTRODE REM PT RTRN 9FT ADLT (ELECTROSURGICAL) ×1 IMPLANT
GLOVE BIO SURGEON STRL SZ 6.5 (GLOVE) ×3 IMPLANT
GLOVE BIO SURGEON STRL SZ7.5 (GLOVE) ×4 IMPLANT
GLOVE BIOGEL PI IND STRL 6.5 (GLOVE) ×1 IMPLANT
GLOVE BIOGEL PI IND STRL 7.5 (GLOVE) ×1 IMPLANT
GOWN STRL REUS W/ TWL LRG LVL3 (GOWN DISPOSABLE) ×1 IMPLANT
GOWN STRL REUS W/TWL LRG LVL3 (GOWN DISPOSABLE) ×1
GUIDE PIN 3.2X343 (PIN) ×2
GUIDE PIN 3.2X343MM (PIN) ×2
GUIDE ROD 3.0 (MISCELLANEOUS) ×1
KIT BASIN OR (CUSTOM PROCEDURE TRAY) ×1 IMPLANT
KIT TURNOVER KIT B (KITS) ×1 IMPLANT
MANIFOLD NEPTUNE II (INSTRUMENTS) ×1 IMPLANT
NAIL LOCK CANN 10X360 130D LT (Nail) IMPLANT
NS IRRIG 1000ML POUR BTL (IV SOLUTION) ×1 IMPLANT
PACK GENERAL/GYN (CUSTOM PROCEDURE TRAY) ×1 IMPLANT
PAD ARMBOARD 7.5X6 YLW CONV (MISCELLANEOUS) ×2 IMPLANT
PIN GUIDE 3.2X343MM (PIN) IMPLANT
ROD GUIDE 3.0 (MISCELLANEOUS) IMPLANT
SCREW LAG COMPR KIT 100/95 (Screw) IMPLANT
SCREW TRIGEN LOW PROF 5.0X32.5 (Screw) IMPLANT
SUT MNCRL AB 3-0 PS2 18 (SUTURE) ×1 IMPLANT
SUT VIC AB 0 CT1 27 (SUTURE)
SUT VIC AB 0 CT1 27XBRD ANBCTR (SUTURE) IMPLANT
SUT VIC AB 2-0 CT1 27 (SUTURE) ×2
SUT VIC AB 2-0 CT1 TAPERPNT 27 (SUTURE) ×2 IMPLANT
SUT VIC AB CT1 27XBRD ANBCTRL (SUTURE)
TOWEL GREEN STERILE (TOWEL DISPOSABLE) ×2 IMPLANT
WATER STERILE IRR 1000ML POUR (IV SOLUTION) ×1 IMPLANT

## 2023-06-29 NOTE — Plan of Care (Signed)

## 2023-06-29 NOTE — Op Note (Signed)
Orthopaedic Surgery Operative Note (CSN: [id number] ) Date of Surgery: 06/29/2023  Admit Date: 06/28/2023   Diagnoses: Pre-Op Diagnoses: Left intertrochanteric femur fracture  Post-Op Diagnosis: Same  Procedures: CPT 27245-Cephalomedullary nailing of left intertrochanteric femur fracture  Surgeons : Primary: Roby Lofts, MD  Assistant: Ulyses Southward, PA-C  Location: OR 3   Anesthesia: General   Antibiotics: Ancef 2g preop   Tourniquet time: None used    Estimated Blood Loss: 50 mL  Complications:* No complications entered in OR log *   Specimens:* No specimens in log *   Implants: Implant Name Type Inv. Item Serial No. Manufacturer Lot No. LRB No. Used Action  NAIL LOCK CANN 10X360 130D LT - [id number] Nail NAIL LOCK CANN 10X360 130D LT  [employer] [id number] Left 1 Implanted  SCREW LAG COMPR KIT 100/95 - [id number] Screw SCREW LAG COMPR KIT 100/95  [employer] [id number] Left 1 Implanted  SCREW TRIGEN LOW PROF 5.0X32.5 - [id number] Screw SCREW TRIGEN LOW PROF 5.0X32.5  [employer] [id number] Left 1 Implanted     Indications for Surgery: 82 year old female who sustained a ground-level fall with a left intertrochanteric femur fracture.  Due to the unstable nature of her injury I recommend proceeding with cephalomedullary nailing of her left hip.  Risks and benefits were discussed with the patient.  Risks include but not limited to bleeding, infection, malunion, nonunion, hardware failure, hardware irritation, nerve and blood vessel injury, DVT, even the possibility anesthetic complications.  She agreed to proceed with surgery and consent was obtained.  Operative Findings: Cephalomedullary nailing of left intertrochanteric femur fracture using [employer] InterTAN 10 x 360 mm nail with a 100 mm lag screw and a 95 mm compression screw.  Procedure: The patient was identified in the preoperative holding area. Consent was  confirmed with the patient and their family and all questions were answered. The operative extremity was marked after confirmation with the patient. she was then brought back to the operating room by our anesthesia colleagues.  She was placed under general anesthetic and carefully transferred over to a Hana table.  All bony prominences were well-padded.  Traction was applied to the left lower extremity and fluoroscopic imaging showed adequate reduction.  The left lower extremity was then prepped and draped in usual sterile fashion.  A timeout was performed to verify the patient, the procedure, and the extremity.  Preoperative antibiotics were dosed.  A small incision proximal to the greater trochanter was made and carried down through skin and subcutaneous tissue.  A threaded guidewire was placed at the tip of the greater trochanter and advanced into the proximal metaphysis.  An entry reamer was then used to enter the medullary canal.  I then passed a ball-tipped guidewire down the center of the canal and seated it into the distal metaphysis.  I then measured the length and chose to use a 360 mm nail.  I then placed a 10 mm reamer down the center of the canal and obtained just minimal chatter.  I decided place a 10 x 360 mm nail.  This was placed attached to a targeting arm and I used the targeting arm to direct a threaded guidewire up into the head/neck segment.  I confirmed adequate tip apex distance with fluoroscopic imaging.  I then chose to use 100 mm lag screw.  I then drilled the path for the compression screw and placed an antirotation bar.  I then drilled the path for the  lag screw and placed the lag screw.  I then placed the compression screw and compressed approximately 3 to 4 mm.  I statically locked the proximal portion of the nail.  Using perfect circle technique I then placed a lateral to medial distal interlocking screw.  Final fluoroscopic imaging was obtained.  The incisions were irrigated and  closed with 2-0 Monocryl and Dermabond.  Sterile dressings were applied.  The patient was then awoke from anesthesia and taken to the PACU in stable condition.  Post Op Plan/Instructions: Patient will be weightbearing as tolerated to left lower extremity.  She will receive postoperative Ancef.  She will receive aspirin 325 mg for DVT prophylaxis.  We will have her mobilize with physical and Occupational Therapy.  I was present and performed the entire surgery.  Ulyses Southward, PA-C did assist me throughout the case. An assistant was necessary given the difficulty in approach, maintenance of reduction and ability to instrument the fracture.   Truitt Merle, MD [facility]

## 2023-06-29 NOTE — Anesthesia Preprocedure Evaluation (Signed)
Anesthesia Evaluation  Patient identified by MRN, date of birth, ID band Patient awake    Reviewed: Allergy & Precautions, NPO status , Patient's Chart, lab work & pertinent test results, reviewed documented beta blocker date and time   History of Anesthesia Complications (+) PONV and history of anesthetic complications  Airway Mallampati: III  TM Distance: >3 FB Neck ROM: Full    Dental no notable dental hx. (+) Teeth Intact, Dental Advisory Given   Pulmonary asthma    Pulmonary exam normal breath sounds clear to auscultation       Cardiovascular hypertension, Pt. on medications and Pt. on home beta blockers + CAD and + Peripheral Vascular Disease  Normal cardiovascular exam Rhythm:Regular Rate:Normal  08/2020: 1. Since Watchman FLX well sealed and no thrombus proceded with [facility] with patient only on plavix. DCC x 1 with 150J Converted to NSR with no cmplicaitons. 2. Left ventricular ejection fraction, by estimation, is 60 to 65%. The left ventricle has normal function. 3. Right ventricular systolic function is normal. The right ventricular size is normal. 4. 27 mm Watchman FLX device in good position with no leak. No thrombus and average compression in multiple views 28%. Left atrial size was moderately dilated. No left atrial/left atrial appendage thrombus was detected. 5. Right atrial size was moderately dilated. 6. The mitral valve is abnormal. Mild mitral valve regurgitation. 7. The aortic valve is tricuspid. Aortic valve regurgitation is mild. Mild to moderate aortic valve sclerosis/calcification is present, without any evidence of aortic stenosis.   Neuro/Psych  PSYCHIATRIC DISORDERS Anxiety Depression     Neuromuscular disease (peripheral neuropathy)    GI/Hepatic Neg liver ROS, hiatal hernia,GERD  ,,  Endo/Other  negative endocrine ROSdiabetes    Renal/GU Renal InsufficiencyRenal disease  negative genitourinary    Musculoskeletal  (+) Arthritis ,    Abdominal   Peds negative pediatric ROS (+)  Hematology  (+) Blood dyscrasia, anemia   Anesthesia Other Findings   Reproductive/Obstetrics negative OB ROS                             Anesthesia Physical Anesthesia Plan  ASA: 3  Anesthesia Plan: General   Post-op Pain Management: Tylenol PO (pre-op), Celebrex PO (pre-op)* and Tylenol PO (pre-op)*   Induction: Intravenous  PONV Risk Score and Plan: 4 or greater and Treatment may vary due to age or medical condition, Dexamethasone, Ondansetron and TIVA  Airway Management Planned: Oral ETT  Additional Equipment: None  Intra-op Plan:   Post-operative Plan: Extubation in OR  Informed Consent: I have reviewed the patients History and Physical, chart, labs and discussed the procedure including the risks, benefits and alternatives for the proposed anesthesia with the patient or authorized representative who has indicated his/her understanding and acceptance.     Dental advisory given  Plan Discussed with: CRNA and Anesthesiologist  Anesthesia Plan Comments:         Anesthesia Quick Evaluation

## 2023-06-29 NOTE — H&P (View-Only) (Signed)
Reason for Consult:Left hip fx Referring Physician: Glade Lloyd Time called: 0730 Time at bedside: 0905   [name] is an 82 y.o. female.  HPI: [name] was walking back from the bathroom when she fell. She's unsure what caused the fall. She had immediate left hip pain and could not get up. She was brought to the ED where x-rays showed a left hip fx and orthopedic surgery was consulted. She lives at home with her husband.  Past Medical History:  Diagnosis Date   Acute GI bleeding 05/13/2015   Anxiety    Arthritis    Asthma    Atrial fibrillation (HCC) 10/29/2017   Bakers cyst, left    Chronic kidney disease    stage 3   Constipation    Diabetes mellitus    type 2   Dysrhythmia    GERD (gastroesophageal reflux disease)    Heart murmur    History of cellulitis    History of dizziness    History of hiatal hernia    tiny 06/15/2016   Hypertension    Iron deficiency anemia    Peripheral vascular disease (HCC)    PONV (postoperative nausea and vomiting)    Pulmonary nodule     Past Surgical History:  Procedure Laterality Date   APPENDECTOMY  1952   BIOPSY  10/16/2018   Procedure: BIOPSY;  Surgeon: Vida Rigger, MD;  Location: [facility] ENDOSCOPY;  Service: Endoscopy;;   BIOPSY  11/26/2019   Procedure: BIOPSY;  Surgeon: Vida Rigger, MD;  Location: [facility]OS[facility];  Service: Endoscopy;;   BIOPSY  07/14/2020   Procedure: BIOPSY;  Surgeon: Bernette Redbird, MD;  Location: [facility]OS[facility];  Service: Endoscopy;;   CARDIOVERSION N/A 09/09/2020   Procedure: CARDIOVERSION;  Surgeon: Wendall Stade, MD;  Location: [facility] ENDOSCOPY;  Service: Cardiovascular;  Laterality: N/A;   CARDIOVERSION N/A 11/18/2020   Procedure: CARDIOVERSION;  Surgeon: Little Ishikawa, MD;  Location: [facility] ENDOSCOPY;  Service: Cardiovascular;  Laterality: N/A;   COLONOSCOPY W/ POLYPECTOMY  05/15/2013   ESOPHAGOGASTRODUODENOSCOPY (EGD) WITH PROPOFOL N/A 06/15/2016   Procedure: ESOPHAGOGASTRODUODENOSCOPY (EGD) WITH  PROPOFOL;  Surgeon: Vida Rigger, MD;  Location: [facility];  Service: Endoscopy;  Laterality: N/A;   ESOPHAGOGASTRODUODENOSCOPY (EGD) WITH PROPOFOL N/A 10/16/2018   Procedure: ESOPHAGOGASTRODUODENOSCOPY (EGD) WITH PROPOFOL;  Surgeon: Vida Rigger, MD;  Location: [facility];  Service: Endoscopy;  Laterality: N/A;   ESOPHAGOGASTRODUODENOSCOPY (EGD) WITH PROPOFOL N/A 11/26/2019   Procedure: ESOPHAGOGASTRODUODENOSCOPY (EGD) WITH PROPOFOL;  Surgeon: Vida Rigger, MD;  Location: [facility]OS[facility];  Service: Endoscopy;  Laterality: N/A;   ESOPHAGOGASTRODUODENOSCOPY (EGD) WITH PROPOFOL N/A 07/14/2020   Procedure: ESOPHAGOGASTRODUODENOSCOPY (EGD) WITH PROPOFOL;  Surgeon: Bernette Redbird, MD;  Location: [facility];  Service: Endoscopy;  Laterality: N/A;   EYE SURGERY Bilateral    cataracts removed   GI RADIOFREQUENCY ABLATION  10/16/2018   Procedure: GI RADIOFREQUENCY ABLATION;  Surgeon: Vida Rigger, MD;  Location: [facility];  Service: Endoscopy;;   GI RADIOFREQUENCY ABLATION N/A 11/26/2019   Procedure: GI RADIOFREQUENCY ABLATION;  Surgeon: Vida Rigger, MD;  Location: [facility] ENDOSCOPY;  Service: Endoscopy;  Laterality: N/A;   HAND SURGERY Left 2010   operated on index and ring finger   HOT HEMOSTASIS N/A 07/14/2020   Procedure: HOT HEMOSTASIS (ARGON PLASMA COAGULATION/BICAP);  Surgeon: Bernette Redbird, MD;  Location: [facility] ENDOSCOPY;  Service: Endoscopy;  Laterality: N/A;   INCONTINENCE SURGERY     JOINT REPLACEMENT Right    knee   KNEE ARTHROSCOPY  2003 2005   right knee   KYPHOPLASTY  N/A 01/29/2020   Procedure: T8 KYPHOPLASTY;  Surgeon: Venita Lick, MD;  Location: [facility] OR;  Service: Orthopedics;  Laterality: N/A;  60 mins Local with [facility]   LEFT ATRIAL APPENDAGE OCCLUSION N/A 07/01/2020   Procedure: LEFT ATRIAL APPENDAGE OCCLUSION;  Surgeon: Lanier Prude, MD;  Location: [facility] INVASIVE CV LAB;  Service: Cardiovascular;  Laterality: N/A;   mass fallopian tube     SPINAL CORD STIMULATOR BATTERY  EXCHANGE N/A 09/09/2015   Procedure: SPINAL CORD STIMULATOR BATTERY EXCHANGE;  Surgeon: Venita Lick, MD;  Location: [facility] OR;  Service: Orthopedics;  Laterality: N/A;   SPINAL CORD STIMULATOR BATTERY EXCHANGE N/A 09/08/2021   Procedure: SPINAL CORD STIMULATOR BATTERY EXCHANGE;  Surgeon: Venita Lick, MD;  Location: [facility] OR;  Service: Orthopedics;  Laterality: N/A;   TEE WITHOUT CARDIOVERSION N/A 08/18/2020   Procedure: TRANSESOPHAGEAL ECHOCARDIOGRAM (TEE);  Surgeon: Wendall Stade, MD;  Location: [facility] ENDOSCOPY;  Service: Cardiovascular;  Laterality: N/A;   TEE WITHOUT CARDIOVERSION N/A 09/09/2020   Procedure: TRANSESOPHAGEAL ECHOCARDIOGRAM (TEE);  Surgeon: Wendall Stade, MD;  Location: [facility] ENDOSCOPY;  Service: Cardiovascular;  Laterality: N/A;   TEE WITHOUT CARDIOVERSION N/A 11/18/2020   Procedure: TRANSESOPHAGEAL ECHOCARDIOGRAM (TEE);  Surgeon: Little Ishikawa, MD;  Location: [name] ENDOSCOPY;  Service: Cardiovascular;  Laterality: N/A;   TOTAL KNEE ARTHROPLASTY Left 11/05/2017   Procedure: LEFT TOTAL KNEE ARTHROPLASTY;  Surgeon: Durene Romans, MD;  Location: [facility] ORS;  Service: Orthopedics;  Laterality: Left;  Adductor Block   TUBAL LIGATION     VAGINAL HYSTERECTOMY  1977   1 ovary removed    Family History  Problem Relation Age of Onset   Lung cancer Mother    Heart attack Father    Colon cancer Neg Hx    Stroke Neg Hx    Migraines Neg Hx     Social History:  reports that she has never smoked. She has never used smokeless tobacco. She reports that she does not drink alcohol and does not use drugs.  Allergies:  Allergies  Allergen Reactions   Vasotec Shortness Of Breath and Other (See Comments)    Wheezing, also   Atorvastatin Other (See Comments)    Leg cramps    Codeine Nausea And Vomiting   Cymbalta [Duloxetine Hcl] Itching   Lansoprazole Itching, Swelling, Rash and Other (See Comments)    Redness, swelling of mouth. During 07/12/20-07/15/20 pt tolerated IV protonix  infusion with no reactions.   Morphine And Codeine Itching   Sulfate Itching   Amitriptyline Hcl Other (See Comments)     sleepwalking   Escitalopram Oxalate Itching   Hydrocodone Itching   Iron Other (See Comments)    constipated   Sertraline Hcl Itching   Tramadol Itching   Adhesive [Tape] Rash   Allevyn Adhesive [Wound Dressings] Rash   Scopolamine Rash    Medications: I have reviewed the patient's current medications.  Results for orders placed or performed during the hospital encounter of 06/28/23 (from the past 48 hour(s))  Basic metabolic panel     Status: Abnormal   Collection Time: 06/28/23  8:34 PM  Result Value Ref Range   Sodium 138 135 - 145 mmol/L   Potassium 3.5 3.5 - 5.1 mmol/L   Chloride 97 (L) 98 - 111 mmol/L   CO2 27 22 - 32 mmol/L   Glucose, Bld 107 (H) 70 - 99 mg/dL    Comment: Glucose reference range applies only to samples taken after fasting for at least 8 hours.   BUN  19 8 - 23 mg/dL   Creatinine, Ser 0.98 (H) 0.44 - 1.00 mg/dL   Calcium 9.2 8.9 - 11.9 mg/dL   GFR, Estimated 51 (L) >60 mL/min    Comment: (NOTE) Calculated using the CKD-EPI Creatinine Equation (2021)    Anion gap 14 5 - 15    Comment: Performed at [facility], [address], [place], [place] [address]  CBC with Differential     Status: Abnormal   Collection Time: 06/28/23  8:34 PM  Result Value Ref Range   WBC 14.4 (H) 4.0 - 10.5 K/uL   RBC 5.10 3.87 - 5.11 MIL/uL   Hemoglobin 14.8 12.0 - 15.0 g/dL   HCT 95.6 (H) 21.3 - 08.6 %   MCV 91.6 80.0 - 100.0 fL   MCH 29.0 26.0 - 34.0 pg   MCHC 31.7 30.0 - 36.0 g/dL   RDW 57.8 46.9 - 62.9 %   Platelets 253 150 - 400 K/uL   nRBC 0.0 0.0 - 0.2 %   Neutrophils Relative % 82 %   Neutro Abs 11.8 (H) 1.7 - 7.7 K/uL   Lymphocytes Relative 10 %   Lymphs Abs 1.5 0.7 - 4.0 K/uL   Monocytes Relative 6 %   Monocytes Absolute 0.9 0.1 - 1.0 K/uL   Eosinophils Relative 1 %   Eosinophils Absolute 0.1 0.0 - 0.5 K/uL   Basophils Relative  0 %   Basophils Absolute 0.1 0.0 - 0.1 K/uL   Immature Granulocytes 1 %   Abs Immature Granulocytes 0.07 0.00 - 0.07 K/uL    Comment: Performed at [facility], [address], [place], [place] [address]  Protime-INR     Status: None   Collection Time: 06/28/23  8:34 PM  Result Value Ref Range   Prothrombin Time 14.3 11.4 - 15.2 seconds   INR 1.1 0.8 - 1.2    Comment: (NOTE) INR goal varies based on device and disease states. Performed at [facility], [address], [place], [place] [address]   Type and screen [facility]     Status: None   Collection Time: 06/28/23  8:34 PM  Result Value Ref Range   ABO/RH(D) A POS    Antibody Screen NEG    Sample Expiration      07/01/2023,2359 Performed at [facility], [address] [place], [place] [address]   Glucose, capillary     Status: Abnormal   Collection Time: 06/29/23  1:36 AM  Result Value Ref Range   Glucose-Capillary 189 (H) 70 - 99 mg/dL    Comment: Glucose reference range applies only to samples taken after fasting for at least 8 hours.  Glucose, capillary     Status: Abnormal   Collection Time: 06/29/23  4:59 AM  Result Value Ref Range   Glucose-Capillary 189 (H) 70 - 99 mg/dL    Comment: Glucose reference range applies only to samples taken after fasting for at least 8 hours.  Hemoglobin A1c     Status: Abnormal   Collection Time: 06/29/23  7:35 AM  Result Value Ref Range   Hgb A1c MFr Bld 6.3 (H) 4.8 - 5.6 %    Comment: (NOTE) Pre diabetes:          5.7%-6.4%  Diabetes:              >6.4%  Glycemic control for   <7.0% adults with diabetes    Mean Plasma Glucose 134.11 mg/dL  Comment: Performed at [facility], [address], [place], [place] [address]  CBC     Status: Abnormal   Collection Time: 06/29/23  7:35 AM  Result Value Ref Range   WBC 12.6 (H) 4.0 - 10.5 K/uL   RBC 4.77 3.87 - 5.11 MIL/uL   Hemoglobin 14.2 12.0 - 15.0 g/dL   HCT 08.6 57.8 - 46.9 %    MCV 91.6 80.0 - 100.0 fL   MCH 29.8 26.0 - 34.0 pg   MCHC 32.5 30.0 - 36.0 g/dL   RDW 62.9 52.8 - 41.3 %   Platelets 257 150 - 400 K/uL   nRBC 0.0 0.0 - 0.2 %    Comment: Performed at [facility], [address], [place], [place] [address]  Basic metabolic panel     Status: Abnormal   Collection Time: 06/29/23  7:35 AM  Result Value Ref Range   Sodium 139 135 - 145 mmol/L   Potassium 4.6 3.5 - 5.1 mmol/L   Chloride 98 98 - 111 mmol/L   CO2 32 22 - 32 mmol/L   Glucose, Bld 182 (H) 70 - 99 mg/dL    Comment: Glucose reference range applies only to samples taken after fasting for at least 8 hours.   BUN 19 8 - 23 mg/dL   Creatinine, Ser 0.27 (H) 0.44 - 1.00 mg/dL   Calcium 9.1 8.9 - 25.3 mg/dL   GFR, Estimated 48 (L) >60 mL/min    Comment: (NOTE) Calculated using the CKD-EPI Creatinine Equation (2021)    Anion gap 9 5 - 15    Comment: Performed at [facility], [address] [place], [place] [address]  Glucose, capillary     Status: Abnormal   Collection Time: 06/29/23  8:15 AM  Result Value Ref Range   Glucose-Capillary 179 (H) 70 - 99 mg/dL    Comment: Glucose reference range applies only to samples taken after fasting for at least 8 hours.    Chest Portable 1 View  Result Date: 06/29/2023 CLINICAL DATA:  Preoperative evaluation. EXAM: PORTABLE CHEST 1 VIEW COMPARISON:  November 19, 2020 FINDINGS: The heart size and mediastinal contours are within normal limits. There is marked severity calcification of the aortic arch. Both lungs are clear. Stable spinal stimulator wire positioning is seen with multilevel degenerative changes noted throughout the thoracic spine. IMPRESSION: No acute cardiopulmonary disease. Electronically Signed   By: Aram Candela M.D.   On: 06/29/2023 00:38   DG Hip Unilat With Pelvis 2-3 Views Left  Result Date: 06/28/2023 CLINICAL DATA:  Fall with hip fracture EXAM: DG HIP (WITH OR WITHOUT PELVIS) 2-3V LEFT COMPARISON:  08/11/2022 FINDINGS:  Acute comminuted, displaced and angulated left intertrochanteric fracture. Femoral head projects in joint. Pubic symphysis appears intact. Faint calcifications at the right greater than left ischial tuberosity without significant change. IMPRESSION: Acute comminuted, displaced and angulated left intertrochanteric fracture. Electronically Signed   By: Jasmine Pang M.D.   On: 06/28/2023 23:25   DG Femur Min 2 Views Left  Result Date: 06/28/2023 CLINICAL DATA:  Hip fracture EXAM: LEFT FEMUR 2 VIEWS COMPARISON:  None Available. FINDINGS: Stimulator generator over the left lower quadrant. Acute comminuted and displaced left intertrochanteric fracture with mild varus angulation. No femoral head dislocation IMPRESSION: Acute comminuted, displaced and mildly angulated left intertrochanteric fracture. Electronically Signed   By: Jasmine Pang M.D.   On: 06/28/2023 23:24   DG Knee 2 Views Left  Result Date: 06/28/2023 CLINICAL DATA:  Hip problem EXAM: LEFT KNEE - 1-2 VIEW COMPARISON:  None Available. FINDINGS: Left knee replacement with intact hardware and normal alignment. Trace knee effusion. No fracture is seen IMPRESSION: Left knee replacement with trace effusion. Electronically Signed   By: Jasmine Pang M.D.   On: 06/28/2023 23:23   ECHOCARDIOGRAM COMPLETE  Result Date: 06/27/2023    ECHOCARDIOGRAM REPORT   Patient Name:   [name] Date of Exam: 06/27/2023 Medical Rec #:  [id number]        Height:       64.0 in Accession #:    [id number]       Weight:       136.0 lb Date of Birth:  [birth year]         BSA:          1.661 m Patient Age:    82 years         BP:           116/68 mmHg Patient Gender: F                HR:           60 bpm. Exam Location:  [address] Procedure: 2D Echo, 3D Echo, Cardiac Doppler, Color Doppler and Strain Analysis Indications:    R06.02 SOB  History:        Patient has prior history of Echocardiogram examinations, most                 recent 11/18/2020. HFpEF, CAD, S/p  Watchman, Arrythmias:Atrial                 Fibrillation, Signs/Symptoms:Fatigue and Shortness of Breath;                 Risk Factors:Hypertension and Diabetes. Previous TEE revealed                 LVEF 60% moderate TR.  Sonographer:    Chanetta Marshall BA, RDCS Referring Phys: 9 TESSA N CONTE IMPRESSIONS  1. Left ventricular ejection fraction, by estimation, is 60 to 65%. The left ventricle has normal function. The left ventricle has no regional wall motion abnormalities. Left ventricular diastolic parameters were normal. The average left ventricular global longitudinal strain is -17.1 %. The global longitudinal strain is normal.  2. Right ventricular systolic function is normal. The right ventricular size is normal. There is normal pulmonary artery systolic pressure. The estimated right ventricular systolic pressure is 33.2 mmHg.  3. The mitral valve is degenerative. Trivial mitral valve regurgitation. No evidence of mitral stenosis.  4. The aortic valve is tricuspid. Aortic valve regurgitation is not visualized. Aortic valve sclerosis is present, with no evidence of aortic valve stenosis.  5. The inferior vena cava is normal in size with greater than 50% respiratory variability, suggesting right atrial pressure of 3 mmHg. FINDINGS  Left Ventricle: Left ventricular ejection fraction, by estimation, is 60 to 65%. The left ventricle has normal function. The left ventricle has no regional wall motion abnormalities. The average left ventricular global longitudinal strain is -17.1 %. The global longitudinal strain is normal. The left ventricular internal cavity size was normal in size. There is no left ventricular hypertrophy. Left ventricular diastolic parameters were normal. Right Ventricle: The right ventricular size is normal. No increase in right ventricular wall thickness. Right ventricular systolic function is normal. There is normal pulmonary artery systolic pressure. The tricuspid regurgitant velocity is  2.75 m/s, and  with an assumed right atrial  pressure of 3 mmHg, the estimated right ventricular systolic pressure is 33.2 mmHg. Left Atrium: Left atrial size was normal in size. Right Atrium: Right atrial size was normal in size. Pericardium: There is no evidence of pericardial effusion. Mitral Valve: The mitral valve is degenerative in appearance. There is mild thickening of the mitral valve leaflet(s). Normal mobility of the mitral valve leaflets. Mild mitral annular calcification. Trivial mitral valve regurgitation. No evidence of mitral valve stenosis. Tricuspid Valve: The tricuspid valve is normal in structure. Tricuspid valve regurgitation is mild . No evidence of tricuspid stenosis. Aortic Valve: The aortic valve is tricuspid. Aortic valve regurgitation is not visualized. Aortic valve sclerosis is present, with no evidence of aortic valve stenosis. Pulmonic Valve: The pulmonic valve was grossly normal. Pulmonic valve regurgitation is not visualized. No evidence of pulmonic stenosis. Aorta: The aortic root is normal in size and structure. Venous: The inferior vena cava is normal in size with greater than 50% respiratory variability, suggesting right atrial pressure of 3 mmHg. IAS/Shunts: The atrial septum is grossly normal.  LEFT VENTRICLE PLAX 2D LVIDd:         4.30 cm   Diastology LVIDs:         2.70 cm   LV e' medial:    9.46 cm/s LV PW:         0.90 cm   LV E/e' medial:  10.7 LV IVS:        1.00 cm   LV e' lateral:   9.03 cm/s LVOT diam:     2.00 cm   LV E/e' lateral: 11.2 LV SV:         51 LV SV Index:   31        2D Longitudinal Strain LVOT Area:     3.14 cm  2D Strain GLS (A2C):   -16.3 %                          2D Strain GLS (A3C):   -17.6 %                          2D Strain GLS (A4C):   -17.3 %                          2D Strain GLS Avg:     -17.1 %                           3D Volume EF:                          3D EF:        61 %                          LV EDV:       119 ml                           LV ESV:       47 ml                          LV SV:        72 ml RIGHT VENTRICLE  IVC RV Basal diam:  3.20 cm    IVC diam: 1.10 cm RV S prime:     9.36 cm/s TAPSE (M-mode): 2.1 cm RVSP:           33.2 mmHg LEFT ATRIUM             Index        RIGHT ATRIUM           Index LA diam:        4.40 cm 2.65 cm/m   RA Pressure: 3.00 mmHg LA Vol (A2C):   31.6 ml 19.00 ml/m  RA Area:     12.40 cm LA Vol (A4C):   27.5 ml 16.56 ml/m  RA Volume:   28.20 ml  16.98 ml/m LA Biplane Vol: 38.9 ml 23.43 ml/m  AORTIC VALVE LVOT Vmax:   66.20 cm/s LVOT Vmean:  45.200 cm/s LVOT VTI:    0.162 m  AORTA Ao Root diam: 2.90 cm MITRAL VALVE                TRICUSPID VALVE MV Area (PHT): 3.89 cm     TR Peak grad:   30.2 mmHg MV Decel Time: 195 msec     TR Vmax:        275.00 cm/s MV E velocity: 101.00 cm/s  Estimated RAP:  3.00 mmHg MV A velocity: 27.20 cm/s   RVSP:           33.2 mmHg MV E/A ratio:  3.71                             SHUNTS                             Systemic VTI:  0.16 m                             Systemic Diam: 2.00 cm Sunit Tolia Electronically signed by Tessa Lerner Signature Date/Time: 06/27/2023/5:44:29 PM    Final     Review of Systems  HENT:  Negative for ear discharge, ear pain, hearing loss and tinnitus.   Eyes:  Negative for photophobia and pain.  Respiratory:  Negative for cough and shortness of breath.   Cardiovascular:  Negative for chest pain.  Gastrointestinal:  Negative for abdominal pain, nausea and vomiting.  Genitourinary:  Negative for dysuria, flank pain, frequency and urgency.  Musculoskeletal:  Positive for arthralgias (Left hip). Negative for back pain, myalgias and neck pain.  Neurological:  Negative for dizziness and headaches.  Hematological:  Does not bruise/bleed easily.  Psychiatric/Behavioral:  The patient is not nervous/anxious.    Blood pressure 135/89, pulse 68, temperature 98.7 F (37.1 C), temperature source Oral, resp. rate 12, height 5\' 4"  (1.626 m), weight  60.8 kg, SpO2 96%. Physical Exam Constitutional:      General: She is not in acute distress.    Appearance: She is well-developed. She is not diaphoretic.  HENT:     Head: Normocephalic and atraumatic.  Eyes:     General: No scleral icterus.       Right eye: No discharge.        Left eye: No discharge.     Conjunctiva/sclera: Conjunctivae normal.  Cardiovascular:     Rate and Rhythm: Normal rate and regular rhythm.  Pulmonary:     Effort: Pulmonary effort  is normal. No respiratory distress.  Musculoskeletal:     Cervical back: Normal range of motion.     Comments: LLE No traumatic wounds, ecchymosis, or rash  Mild TTP hip  No knee or ankle effusion  Knee stable to varus/ valgus and anterior/posterior stress  Sens DPN, SPN, TN intact  Motor EHL, ext, flex, evers 5/5  DP 2+, PT 2+, No significant edema  Skin:    General: Skin is warm and dry.  Neurological:     Mental Status: She is alert.  Psychiatric:        Mood and Affect: Mood normal.        Behavior: Behavior normal.     Assessment/Plan: Left hip fx -- Plan IMN today with Dr. Jena Gauss. Please keep NPO. Multiple medical problems including hypertension, type 2 diabetes mellitus, hyperlipidemia, chronic HFpEF, depression, PAF not anticoagulated due to GI bleeding and s/p Watchman procedure -- per primary service    Freeman Caldron, PA-C Orthopedic Surgery [phone] 06/29/2023, 9:17 AM

## 2023-06-29 NOTE — Progress Notes (Signed)
PROGRESS NOTE    [name]  XBJ:[id number] DOB: 1941-01-03 DOA: 06/28/2023 PCP: Emilio Aspen, MD   Brief Narrative:  82 y.o. female with medical history significant for hypertension, type 2 diabetes mellitus, hyperlipidemia, chronic HFpEF, depression, and PAF not anticoagulated due to GI bleeding and s/p Watchman procedure presented with left leg pain after fall at home and was found to have left intertrochanteric femur fracture.  Orthopedics was consulted.  Assessment & Plan:   Left intertrochanteric femur fracture after a fall -Pain management.  Fall precautions.  NPO.  Follow orthopedic surgery recommendations.  Possible surgical intervention today.  Paroxysmal A-fib - Could not tolerate anticoagulation d/t GI bleeding and is s/p Wathcman procedure  - Continue metoprolol   CKD stage IIIa -Creatinine at baseline.  Monitor.  Leukocytosis -Possibly reactive.  No labs today.  Monitor  Hypertension -Continue metoprolol.  Monitor blood pressure  Diabetes mellitus type 2 -Continue CBGs with SSI  Chronic diastolic heart failure -Currently compensated.  Strict input and output.  Daily weights.  Continue metoprolol.  Lasix on hold  Depression -Continue Effexor     DVT prophylaxis: SCDs Code Status: Full Family Communication: Husband at bedside Disposition Plan: Status is: Inpatient Remains inpatient appropriate because: Of severity of illness    Consultants: Orthopedics  Procedures: None  Antimicrobials: None   Subjective: Patient seen and examined at bedside.  Complains of left hip pain.  No fever or chest pain reported.  Complains of intermittent nausea and vomiting but  Objective: Vitals:   06/28/23 2035 06/28/23 2037 06/28/23 2330 06/29/23 0029  BP: (!) 180/59  (!) 149/57 (!) 153/52  Pulse: 65  62 64  Resp: 15  17 18   Temp: 97.8 F (36.6 C)   97.7 F (36.5 C)  TempSrc: Oral   Oral  SpO2: 98%  100% 100%  Weight:  60.8 kg    Height:  5'  4" (1.626 m)     No intake or output data in the 24 hours ending 06/29/23 0810 Filed Weights   06/28/23 2037  Weight: 60.8 kg    Examination:  General exam: Appears calm and comfortable.  On room air. Respiratory system: Bilateral decreased breath sounds at bases Cardiovascular system: S1 & S2 heard, Rate controlled Gastrointestinal system: Abdomen is nondistended, soft and nontender. Normal bowel sounds heard. Extremities: No cyanosis, clubbing, edema  Central nervous system: Alert and oriented. No focal neurological deficits. Moving extremities Skin: No rashes, lesions or ulcers Psychiatry: Judgement and insight appear normal. Mood & affect appropriate.     Data Reviewed: I have personally reviewed following labs and imaging studies  CBC: Recent Labs  Lab 06/28/23 2034  WBC 14.4*  NEUTROABS 11.8*  HGB 14.8  HCT 46.7*  MCV 91.6  PLT 253   Basic Metabolic Panel: Recent Labs  Lab 06/28/23 2034  NA 138  K 3.5  CL 97*  CO2 27  GLUCOSE 107*  BUN 19  CREATININE 1.09*  CALCIUM 9.2   GFR: Estimated Creatinine Clearance: 34.4 mL/min (A) (by C-G formula based on SCr of 1.09 mg/dL (H)). Liver Function Tests: No results for input(s): "AST", "ALT", "ALKPHOS", "BILITOT", "PROT", "ALBUMIN" in the last 168 hours. No results for input(s): "LIPASE", "AMYLASE" in the last 168 hours. No results for input(s): "AMMONIA" in the last 168 hours. Coagulation Profile: Recent Labs  Lab 06/28/23 2034  INR 1.1   Cardiac Enzymes: No results for input(s): "CKTOTAL", "CKMB", "CKMBINDEX", "TROPONINI" in the last 168 hours. BNP (last 3 results) Recent  Labs    06/05/23 1523  PROBNP 1,103*   HbA1C: No results for input(s): "HGBA1C" in the last 72 hours. CBG: Recent Labs  Lab 06/29/23 0136 06/29/23 0459  GLUCAP 189* 189*   Lipid Profile: No results for input(s): "CHOL", "HDL", "LDLCALC", "TRIG", "CHOLHDL", "LDLDIRECT" in the last 72 hours. Thyroid Function Tests: No results  for input(s): "TSH", "T4TOTAL", "FREET4", "T3FREE", "THYROIDAB" in the last 72 hours. Anemia Panel: No results for input(s): "VITAMINB12", "FOLATE", "FERRITIN", "TIBC", "IRON", "RETICCTPCT" in the last 72 hours. Sepsis Labs: No results for input(s): "PROCALCITON", "LATICACIDVEN" in the last 168 hours.  No results found for this or any previous visit (from the past 240 hour(s)).       Radiology Studies: Chest Portable 1 View  Result Date: 06/29/2023 CLINICAL DATA:  Preoperative evaluation. EXAM: PORTABLE CHEST 1 VIEW COMPARISON:  November 19, 2020 FINDINGS: The heart size and mediastinal contours are within normal limits. There is marked severity calcification of the aortic arch. Both lungs are clear. Stable spinal stimulator wire positioning is seen with multilevel degenerative changes noted throughout the thoracic spine. IMPRESSION: No acute cardiopulmonary disease. Electronically Signed   By: Aram Candela M.D.   On: 06/29/2023 00:38   DG Hip Unilat With Pelvis 2-3 Views Left  Result Date: 06/28/2023 CLINICAL DATA:  Fall with hip fracture EXAM: DG HIP (WITH OR WITHOUT PELVIS) 2-3V LEFT COMPARISON:  08/11/2022 FINDINGS: Acute comminuted, displaced and angulated left intertrochanteric fracture. Femoral head projects in joint. Pubic symphysis appears intact. Faint calcifications at the right greater than left ischial tuberosity without significant change. IMPRESSION: Acute comminuted, displaced and angulated left intertrochanteric fracture. Electronically Signed   By: Jasmine Pang M.D.   On: 06/28/2023 23:25   DG Femur Min 2 Views Left  Result Date: 06/28/2023 CLINICAL DATA:  Hip fracture EXAM: LEFT FEMUR 2 VIEWS COMPARISON:  None Available. FINDINGS: Stimulator generator over the left lower quadrant. Acute comminuted and displaced left intertrochanteric fracture with mild varus angulation. No femoral head dislocation IMPRESSION: Acute comminuted, displaced and mildly angulated left  intertrochanteric fracture. Electronically Signed   By: Jasmine Pang M.D.   On: 06/28/2023 23:24   DG Knee 2 Views Left  Result Date: 06/28/2023 CLINICAL DATA:  Hip problem EXAM: LEFT KNEE - 1-2 VIEW COMPARISON:  None Available. FINDINGS: Left knee replacement with intact hardware and normal alignment. Trace knee effusion. No fracture is seen IMPRESSION: Left knee replacement with trace effusion. Electronically Signed   By: Jasmine Pang M.D.   On: 06/28/2023 23:23   ECHOCARDIOGRAM COMPLETE  Result Date: 06/27/2023    ECHOCARDIOGRAM REPORT   Patient Name:   MANHA AMATO Date of Exam: 06/27/2023 Medical Rec #:  [id number]        Height:       64.0 in Accession #:    [id number]       Weight:       136.0 lb Date of Birth:  08-07-1941         BSA:          1.661 m Patient Age:    82 years         BP:           116/68 mmHg Patient Gender: F                HR:           60 bpm. Exam Location:  [address] Procedure: 2D Echo, 3D Echo, Cardiac Doppler, Color Doppler and Strain  Analysis Indications:    R06.02 SOB  History:        Patient has prior history of Echocardiogram examinations, most                 recent 11/18/2020. HFpEF, CAD, S/p Watchman, Arrythmias:Atrial                 Fibrillation, Signs/Symptoms:Fatigue and Shortness of Breath;                 Risk Factors:Hypertension and Diabetes. Previous TEE revealed                 LVEF 60% moderate TR.  Sonographer:    Chanetta Marshall BA, RDCS Referring Phys: [id number] TESSA N CONTE IMPRESSIONS  1. Left ventricular ejection fraction, by estimation, is 60 to 65%. The left ventricle has normal function. The left ventricle has no regional wall motion abnormalities. Left ventricular diastolic parameters were normal. The average left ventricular global longitudinal strain is -17.1 %. The global longitudinal strain is normal.  2. Right ventricular systolic function is normal. The right ventricular size is normal. There is normal pulmonary artery systolic pressure.  The estimated right ventricular systolic pressure is 33.2 mmHg.  3. The mitral valve is degenerative. Trivial mitral valve regurgitation. No evidence of mitral stenosis.  4. The aortic valve is tricuspid. Aortic valve regurgitation is not visualized. Aortic valve sclerosis is present, with no evidence of aortic valve stenosis.  5. The inferior vena cava is normal in size with greater than 50% respiratory variability, suggesting right atrial pressure of 3 mmHg. FINDINGS  Left Ventricle: Left ventricular ejection fraction, by estimation, is 60 to 65%. The left ventricle has normal function. The left ventricle has no regional wall motion abnormalities. The average left ventricular global longitudinal strain is -17.1 %. The global longitudinal strain is normal. The left ventricular internal cavity size was normal in size. There is no left ventricular hypertrophy. Left ventricular diastolic parameters were normal. Right Ventricle: The right ventricular size is normal. No increase in right ventricular wall thickness. Right ventricular systolic function is normal. There is normal pulmonary artery systolic pressure. The tricuspid regurgitant velocity is 2.75 m/s, and  with an assumed right atrial pressure of 3 mmHg, the estimated right ventricular systolic pressure is 33.2 mmHg. Left Atrium: Left atrial size was normal in size. Right Atrium: Right atrial size was normal in size. Pericardium: There is no evidence of pericardial effusion. Mitral Valve: The mitral valve is degenerative in appearance. There is mild thickening of the mitral valve leaflet(s). Normal mobility of the mitral valve leaflets. Mild mitral annular calcification. Trivial mitral valve regurgitation. No evidence of mitral valve stenosis. Tricuspid Valve: The tricuspid valve is normal in structure. Tricuspid valve regurgitation is mild . No evidence of tricuspid stenosis. Aortic Valve: The aortic valve is tricuspid. Aortic valve regurgitation is not  visualized. Aortic valve sclerosis is present, with no evidence of aortic valve stenosis. Pulmonic Valve: The pulmonic valve was grossly normal. Pulmonic valve regurgitation is not visualized. No evidence of pulmonic stenosis. Aorta: The aortic root is normal in size and structure. Venous: The inferior vena cava is normal in size with greater than 50% respiratory variability, suggesting right atrial pressure of 3 mmHg. IAS/Shunts: The atrial septum is grossly normal.  LEFT VENTRICLE PLAX 2D LVIDd:         4.30 cm   Diastology LVIDs:         2.70 cm   LV e' medial:  9.46 cm/s LV PW:         0.90 cm   LV E/e' medial:  10.7 LV IVS:        1.00 cm   LV e' lateral:   9.03 cm/s LVOT diam:     2.00 cm   LV E/e' lateral: 11.2 LV SV:         51 LV SV Index:   31        2D Longitudinal Strain LVOT Area:     3.14 cm  2D Strain GLS (A2C):   -16.3 %                          2D Strain GLS (A3C):   -17.6 %                          2D Strain GLS (A4C):   -17.3 %                          2D Strain GLS Avg:     -17.1 %                           3D Volume EF:                          3D EF:        61 %                          LV EDV:       119 ml                          LV ESV:       47 ml                          LV SV:        72 ml RIGHT VENTRICLE            IVC RV Basal diam:  3.20 cm    IVC diam: 1.10 cm RV S prime:     9.36 cm/s TAPSE (M-mode): 2.1 cm RVSP:           33.2 mmHg LEFT ATRIUM             Index        RIGHT ATRIUM           Index LA diam:        4.40 cm 2.65 cm/m   RA Pressure: 3.00 mmHg LA Vol (A2C):   31.6 ml 19.00 ml/m  RA Area:     12.40 cm LA Vol (A4C):   27.5 ml 16.56 ml/m  RA Volume:   28.20 ml  16.98 ml/m LA Biplane Vol: 38.9 ml 23.43 ml/m  AORTIC VALVE LVOT Vmax:   66.20 cm/s LVOT Vmean:  45.200 cm/s LVOT VTI:    0.162 m  AORTA Ao Root diam: 2.90 cm MITRAL VALVE                TRICUSPID VALVE MV Area (PHT): 3.89 cm     TR Peak grad:   30.2 mmHg MV Decel Time: 195 msec     TR Vmax:  275.00  cm/s MV E velocity: 101.00 cm/s  Estimated RAP:  3.00 mmHg MV A velocity: 27.20 cm/s   RVSP:           33.2 mmHg MV E/A ratio:  3.71                             SHUNTS                             Systemic VTI:  0.16 m                             Systemic Diam: 2.00 cm Sunit Tolia Electronically signed by Tessa Lerner Signature Date/Time: 06/27/2023/5:44:29 PM    Final         Scheduled Meds:  famotidine  40 mg Oral Daily   gabapentin  300 mg Oral Daily   And   gabapentin  600 mg Oral QHS   insulin aspart  0-6 Units Subcutaneous Q4H   metoprolol tartrate  100 mg Oral BID   venlafaxine  50 mg Oral BID WC   Continuous Infusions:        Glade Lloyd, MD [facility] 06/29/2023, 8:10 AM

## 2023-06-29 NOTE — Transfer of Care (Signed)
Immediate Anesthesia Transfer of Care Note  Patient: [name]  Procedure(s) Performed: INTRAMEDULLARY (IM) NAIL INTERTROCHANTERIC (Left)  Patient Location: PACU  Anesthesia Type:General  Level of Consciousness: awake and drowsy  Airway & Oxygen Therapy: Patient Spontanous Breathing and Patient connected to face mask oxygen  Post-op Assessment: Report given to RN and Post -op Vital signs reviewed and stable  Post vital signs: Reviewed and stable  Last Vitals:  Vitals Value Taken Time  BP 122/36 06/29/23 1703  Temp 36.1 C 06/29/23 1703  Pulse 77 06/29/23 1714  Resp 22 06/29/23 1714  SpO2 95 % 06/29/23 1714  Vitals shown include unfiled device data.  Last Pain:  Vitals:   06/29/23 1434  TempSrc: Oral  PainSc: 9          Complications: No notable events documented.

## 2023-06-29 NOTE — Progress Notes (Signed)
Initial Nutrition Assessment  DOCUMENTATION CODES:   Not applicable  INTERVENTION:  Provide Ensure Enlive po BID, each supplement provides 350 kcal and 20 grams of protein. Pt prefers vanilla flavor.  Provide multivitamin with minerals po daily.  NUTRITION DIAGNOSIS:   Increased nutrient needs related to hip fracture, post-op healing as evidenced by estimated needs.  GOAL:   Patient will meet greater than or equal to 90% of their needs  MONITOR:   PO intake, Supplement acceptance, Diet advancement, Labs, Weight trends, I & O's, Skin  REASON FOR ASSESSMENT:   Consult Assessment of nutrition requirement/status, Hip fracture protocol  ASSESSMENT:   82 year old female with PMHx of HTN, DM type 2, HLD, chronic HFpEF, depression, PAF not anticoagulated due to GI bleeding s/p Watchman procedure admitted after a fall found to have left hip fracture.  Plan to go to OR for surgical repair today.  Met with pt and her husband at bedside. Pt reports she had a good appetite at baseline. She typically eats 3 meals per day + snacks. For breakfast she has eggs or oatmeal or cereal or toast with bacon. She has a snack after breakfast of small Coke and blueberry muffin top. For lunch she has leftovers or homemade soup. For dinner she has chicken or beef with sides or spaghetti. Husband reports he prepares most of the meals they eat. Pt denies any food allergies or intolerances. Pt endorses some nausea from taking medications on empty stomach (NPO prior to surgery). Typically denies any nausea or emesis at baseline. Denies abdominal pain. Denies difficulty with chewing/swallowing. Discussed increased nutrient needs for healing after surgery. Pt agreeable to drink Ensure to help meet calorie/protein needs.  Pt reports her UBW is 133 lbs and denies any unintentional weight loss. Per review of chart pt was 60.8-61.7 kg the past year. Currently documented to be 60.8 kg (134 lbs).  Medications reviewed  and include: famotidine, gabapentin, Novolog 0-6 units every 4 hours  Labs reviewed: CBG 179-193, Creatinine 1.15  UOP: no I/O data documented since admission  I/O: no I/O data documented since admission  NUTRITION - FOCUSED PHYSICAL EXAM:  Flowsheet Row Most Recent Value  Orbital Region No depletion  Upper Arm Region Mild depletion  Thoracic and Lumbar Region No depletion  Buccal Region No depletion  Temple Region No depletion  Clavicle Bone Region Mild depletion  Clavicle and Acromion Bone Region Mild depletion  Scapular Bone Region Unable to assess  Dorsal Hand Moderate depletion  Patellar Region Moderate depletion  [assessed right leg]  Anterior Thigh Region Moderate depletion  [assessed right leg]  Posterior Calf Region Moderate depletion  [assessed right leg]  Edema (RD Assessment) Mild  Hair Reviewed  Eyes Reviewed  Mouth Reviewed  Skin Reviewed  Nails Reviewed       Diet Order:   Diet Order             Diet NPO time specified Except for: Sips with Meds  Diet effective now                  EDUCATION NEEDS:   No education needs have been identified at this time  Skin:  Skin Assessment: Reviewed RN Assessment  Last BM:  06/28/23 per chart  Height:   Ht Readings from Last 1 Encounters:  06/28/23 5\' 4"  (1.626 m)   Weight:   Wt Readings from Last 1 Encounters:  06/28/23 60.8 kg   BMI:  Body mass index is 23 kg/m.  Estimated  Nutritional Needs:   Kcal:  1600-1800  Protein:  80-90 grams  Fluid:  1.6-1.8 L/day  Letta Median, MS, RD, LDN, CNSC Pager number available on Amion

## 2023-06-29 NOTE — TOC CAGE-AID Note (Signed)
Transition of Care [facility]) - CAGE-AID Screening   Patient Details  Name: [name] MRN: [id number] Date of Birth: 1940/11/03    Judie Bonus, RN Phone Number: 06/29/2023, 1:37 PM   Clinical Narrative: Pt denies etoh,tobacco, drug usage. No resources needed.    CAGE-AID Screening:    Have You Ever Felt You Ought to Cut Down on Your Drinking or Drug Use?: No Have People Annoyed You By Critizing Your Drinking Or Drug Use?: No Have You Felt Bad Or Guilty About Your Drinking Or Drug Use?: No Have You Ever Had a Drink or Used Drugs First Thing In The Morning to Steady Your Nerves or to Get Rid of a Hangover?: No CAGE-AID Score: 0  Substance Abuse Education Offered: No

## 2023-06-29 NOTE — Consult Note (Signed)
Reason for Consult:Left hip fx Referring Physician: Glade Lloyd Time called: 0730 Time at bedside: 0905   [name] is an 82 y.o. female.  HPI: [name] was walking back from the bathroom when she fell. She's unsure what caused the fall. She had immediate left hip pain and could not get up. She was brought to the ED where x-rays showed a left hip fx and orthopedic surgery was consulted. She lives at home with her husband.  Past Medical History:  Diagnosis Date   Acute GI bleeding 05/13/2015   Anxiety    Arthritis    Asthma    Atrial fibrillation (HCC) 10/29/2017   Bakers cyst, left    Chronic kidney disease    stage 3   Constipation    Diabetes mellitus    type 2   Dysrhythmia    GERD (gastroesophageal reflux disease)    Heart murmur    History of cellulitis    History of dizziness    History of hiatal hernia    tiny 06/15/2016   Hypertension    Iron deficiency anemia    Peripheral vascular disease (HCC)    PONV (postoperative nausea and vomiting)    Pulmonary nodule     Past Surgical History:  Procedure Laterality Date   APPENDECTOMY  1952   BIOPSY  10/16/2018   Procedure: BIOPSY;  Surgeon: Vida Rigger, MD;  Location: [facility] ENDOSCOPY;  Service: Endoscopy;;   BIOPSY  11/26/2019   Procedure: BIOPSY;  Surgeon: Vida Rigger, MD;  Location: [facility]OS[facility];  Service: Endoscopy;;   BIOPSY  07/14/2020   Procedure: BIOPSY;  Surgeon: Bernette Redbird, MD;  Location: [facility]OS[facility];  Service: Endoscopy;;   CARDIOVERSION N/A 09/09/2020   Procedure: CARDIOVERSION;  Surgeon: Wendall Stade, MD;  Location: [facility] ENDOSCOPY;  Service: Cardiovascular;  Laterality: N/A;   CARDIOVERSION N/A 11/18/2020   Procedure: CARDIOVERSION;  Surgeon: Little Ishikawa, MD;  Location: [facility] ENDOSCOPY;  Service: Cardiovascular;  Laterality: N/A;   COLONOSCOPY W/ POLYPECTOMY  05/15/2013   ESOPHAGOGASTRODUODENOSCOPY (EGD) WITH PROPOFOL N/A 06/15/2016   Procedure: ESOPHAGOGASTRODUODENOSCOPY (EGD) WITH  PROPOFOL;  Surgeon: Vida Rigger, MD;  Location: [facility];  Service: Endoscopy;  Laterality: N/A;   ESOPHAGOGASTRODUODENOSCOPY (EGD) WITH PROPOFOL N/A 10/16/2018   Procedure: ESOPHAGOGASTRODUODENOSCOPY (EGD) WITH PROPOFOL;  Surgeon: Vida Rigger, MD;  Location: [facility];  Service: Endoscopy;  Laterality: N/A;   ESOPHAGOGASTRODUODENOSCOPY (EGD) WITH PROPOFOL N/A 11/26/2019   Procedure: ESOPHAGOGASTRODUODENOSCOPY (EGD) WITH PROPOFOL;  Surgeon: Vida Rigger, MD;  Location: [facility]OS[facility];  Service: Endoscopy;  Laterality: N/A;   ESOPHAGOGASTRODUODENOSCOPY (EGD) WITH PROPOFOL N/A 07/14/2020   Procedure: ESOPHAGOGASTRODUODENOSCOPY (EGD) WITH PROPOFOL;  Surgeon: Bernette Redbird, MD;  Location: [facility];  Service: Endoscopy;  Laterality: N/A;   EYE SURGERY Bilateral    cataracts removed   GI RADIOFREQUENCY ABLATION  10/16/2018   Procedure: GI RADIOFREQUENCY ABLATION;  Surgeon: Vida Rigger, MD;  Location: [facility];  Service: Endoscopy;;   GI RADIOFREQUENCY ABLATION N/A 11/26/2019   Procedure: GI RADIOFREQUENCY ABLATION;  Surgeon: Vida Rigger, MD;  Location: [facility]OS[facility];  Service: Endoscopy;  Laterality: N/A;   HAND SURGERY Left 2010   operated on index and ring finger   HOT HEMOSTASIS N/A 07/14/2020   Procedure: HOT HEMOSTASIS (ARGON PLASMA COAGULATION/BICAP);  Surgeon: Bernette Redbird, MD;  Location: [facility] ENDOSCOPY;  Service: Endoscopy;  Laterality: N/A;   INCONTINENCE SURGERY     JOINT REPLACEMENT Right    knee   KNEE ARTHROSCOPY  2003 2005   right knee   KYPHOPLASTY  N/A 01/29/2020   Procedure: T8 KYPHOPLASTY;  Surgeon: Venita Lick, MD;  Location: [facility] OR;  Service: Orthopedics;  Laterality: N/A;  60 mins Local with [facility]   LEFT ATRIAL APPENDAGE OCCLUSION N/A 07/01/2020   Procedure: LEFT ATRIAL APPENDAGE OCCLUSION;  Surgeon: Lanier Prude, MD;  Location: [facility] INVASIVE CV LAB;  Service: Cardiovascular;  Laterality: N/A;   mass fallopian tube     SPINAL CORD STIMULATOR BATTERY  EXCHANGE N/A 09/09/2015   Procedure: SPINAL CORD STIMULATOR BATTERY EXCHANGE;  Surgeon: Venita Lick, MD;  Location: [facility] OR;  Service: Orthopedics;  Laterality: N/A;   SPINAL CORD STIMULATOR BATTERY EXCHANGE N/A 09/08/2021   Procedure: SPINAL CORD STIMULATOR BATTERY EXCHANGE;  Surgeon: Venita Lick, MD;  Location: [facility] OR;  Service: Orthopedics;  Laterality: N/A;   TEE WITHOUT CARDIOVERSION N/A 08/18/2020   Procedure: TRANSESOPHAGEAL ECHOCARDIOGRAM (TEE);  Surgeon: Wendall Stade, MD;  Location: [facility] ENDOSCOPY;  Service: Cardiovascular;  Laterality: N/A;   TEE WITHOUT CARDIOVERSION N/A 09/09/2020   Procedure: TRANSESOPHAGEAL ECHOCARDIOGRAM (TEE);  Surgeon: Wendall Stade, MD;  Location: [facility] ENDOSCOPY;  Service: Cardiovascular;  Laterality: N/A;   TEE WITHOUT CARDIOVERSION N/A 11/18/2020   Procedure: TRANSESOPHAGEAL ECHOCARDIOGRAM (TEE);  Surgeon: Little Ishikawa, MD;  Location: [name] ENDOSCOPY;  Service: Cardiovascular;  Laterality: N/A;   TOTAL KNEE ARTHROPLASTY Left 11/05/2017   Procedure: LEFT TOTAL KNEE ARTHROPLASTY;  Surgeon: Durene Romans, MD;  Location: [facility] ORS;  Service: Orthopedics;  Laterality: Left;  Adductor Block   TUBAL LIGATION     VAGINAL HYSTERECTOMY  1977   1 ovary removed    Family History  Problem Relation Age of Onset   Lung cancer Mother    Heart attack Father    Colon cancer Neg Hx    Stroke Neg Hx    Migraines Neg Hx     Social History:  reports that she has never smoked. She has never used smokeless tobacco. She reports that she does not drink alcohol and does not use drugs.  Allergies:  Allergies  Allergen Reactions   Vasotec Shortness Of Breath and Other (See Comments)    Wheezing, also   Atorvastatin Other (See Comments)    Leg cramps    Codeine Nausea And Vomiting   Cymbalta [Duloxetine Hcl] Itching   Lansoprazole Itching, Swelling, Rash and Other (See Comments)    Redness, swelling of mouth. During 07/12/20-07/15/20 pt tolerated IV protonix  infusion with no reactions.   Morphine And Codeine Itching   Sulfate Itching   Amitriptyline Hcl Other (See Comments)     sleepwalking   Escitalopram Oxalate Itching   Hydrocodone Itching   Iron Other (See Comments)    constipated   Sertraline Hcl Itching   Tramadol Itching   Adhesive [Tape] Rash   Allevyn Adhesive [Wound Dressings] Rash   Scopolamine Rash    Medications: I have reviewed the patient's current medications.  Results for orders placed or performed during the hospital encounter of 06/28/23 (from the past 48 hour(s))  Basic metabolic panel     Status: Abnormal   Collection Time: 06/28/23  8:34 PM  Result Value Ref Range   Sodium 138 135 - 145 mmol/L   Potassium 3.5 3.5 - 5.1 mmol/L   Chloride 97 (L) 98 - 111 mmol/L   CO2 27 22 - 32 mmol/L   Glucose, Bld 107 (H) 70 - 99 mg/dL    Comment: Glucose reference range applies only to samples taken after fasting for at least 8 hours.   BUN  19 8 - 23 mg/dL   Creatinine, Ser 0.98 (H) 0.44 - 1.00 mg/dL   Calcium 9.2 8.9 - 11.9 mg/dL   GFR, Estimated 51 (L) >60 mL/min    Comment: (NOTE) Calculated using the CKD-EPI Creatinine Equation (2021)    Anion gap 14 5 - 15    Comment: Performed at [facility], [address], [place], [place] [address]  CBC with Differential     Status: Abnormal   Collection Time: 06/28/23  8:34 PM  Result Value Ref Range   WBC 14.4 (H) 4.0 - 10.5 K/uL   RBC 5.10 3.87 - 5.11 MIL/uL   Hemoglobin 14.8 12.0 - 15.0 g/dL   HCT 95.6 (H) 21.3 - 08.6 %   MCV 91.6 80.0 - 100.0 fL   MCH 29.0 26.0 - 34.0 pg   MCHC 31.7 30.0 - 36.0 g/dL   RDW 57.8 46.9 - 62.9 %   Platelets 253 150 - 400 K/uL   nRBC 0.0 0.0 - 0.2 %   Neutrophils Relative % 82 %   Neutro Abs 11.8 (H) 1.7 - 7.7 K/uL   Lymphocytes Relative 10 %   Lymphs Abs 1.5 0.7 - 4.0 K/uL   Monocytes Relative 6 %   Monocytes Absolute 0.9 0.1 - 1.0 K/uL   Eosinophils Relative 1 %   Eosinophils Absolute 0.1 0.0 - 0.5 K/uL   Basophils Relative  0 %   Basophils Absolute 0.1 0.0 - 0.1 K/uL   Immature Granulocytes 1 %   Abs Immature Granulocytes 0.07 0.00 - 0.07 K/uL    Comment: Performed at [facility], [address], [place], [place] [address]  Protime-INR     Status: None   Collection Time: 06/28/23  8:34 PM  Result Value Ref Range   Prothrombin Time 14.3 11.4 - 15.2 seconds   INR 1.1 0.8 - 1.2    Comment: (NOTE) INR goal varies based on device and disease states. Performed at [facility], [address], [place], [place] [address]   Type and screen [facility]     Status: None   Collection Time: 06/28/23  8:34 PM  Result Value Ref Range   ABO/RH(D) A POS    Antibody Screen NEG    Sample Expiration      07/01/2023,2359 Performed at [facility], [address] [place], [place] [address]   Glucose, capillary     Status: Abnormal   Collection Time: 06/29/23  1:36 AM  Result Value Ref Range   Glucose-Capillary 189 (H) 70 - 99 mg/dL    Comment: Glucose reference range applies only to samples taken after fasting for at least 8 hours.  Glucose, capillary     Status: Abnormal   Collection Time: 06/29/23  4:59 AM  Result Value Ref Range   Glucose-Capillary 189 (H) 70 - 99 mg/dL    Comment: Glucose reference range applies only to samples taken after fasting for at least 8 hours.  Hemoglobin A1c     Status: Abnormal   Collection Time: 06/29/23  7:35 AM  Result Value Ref Range   Hgb A1c MFr Bld 6.3 (H) 4.8 - 5.6 %    Comment: (NOTE) Pre diabetes:          5.7%-6.4%  Diabetes:              >6.4%  Glycemic control for   <7.0% adults with diabetes    Mean Plasma Glucose 134.11 mg/dL  Comment: Performed at [facility], [address], [place], [place] [address]  CBC     Status: Abnormal   Collection Time: 06/29/23  7:35 AM  Result Value Ref Range   WBC 12.6 (H) 4.0 - 10.5 K/uL   RBC 4.77 3.87 - 5.11 MIL/uL   Hemoglobin 14.2 12.0 - 15.0 g/dL   HCT 08.6 57.8 - 46.9 %    MCV 91.6 80.0 - 100.0 fL   MCH 29.8 26.0 - 34.0 pg   MCHC 32.5 30.0 - 36.0 g/dL   RDW 62.9 52.8 - 41.3 %   Platelets 257 150 - 400 K/uL   nRBC 0.0 0.0 - 0.2 %    Comment: Performed at [facility], [address], [place], [place] [address]  Basic metabolic panel     Status: Abnormal   Collection Time: 06/29/23  7:35 AM  Result Value Ref Range   Sodium 139 135 - 145 mmol/L   Potassium 4.6 3.5 - 5.1 mmol/L   Chloride 98 98 - 111 mmol/L   CO2 32 22 - 32 mmol/L   Glucose, Bld 182 (H) 70 - 99 mg/dL    Comment: Glucose reference range applies only to samples taken after fasting for at least 8 hours.   BUN 19 8 - 23 mg/dL   Creatinine, Ser 0.27 (H) 0.44 - 1.00 mg/dL   Calcium 9.1 8.9 - 25.3 mg/dL   GFR, Estimated 48 (L) >60 mL/min    Comment: (NOTE) Calculated using the CKD-EPI Creatinine Equation (2021)    Anion gap 9 5 - 15    Comment: Performed at [facility], [address] [place], [place] [address]  Glucose, capillary     Status: Abnormal   Collection Time: 06/29/23  8:15 AM  Result Value Ref Range   Glucose-Capillary 179 (H) 70 - 99 mg/dL    Comment: Glucose reference range applies only to samples taken after fasting for at least 8 hours.    Chest Portable 1 View  Result Date: 06/29/2023 CLINICAL DATA:  Preoperative evaluation. EXAM: PORTABLE CHEST 1 VIEW COMPARISON:  November 19, 2020 FINDINGS: The heart size and mediastinal contours are within normal limits. There is marked severity calcification of the aortic arch. Both lungs are clear. Stable spinal stimulator wire positioning is seen with multilevel degenerative changes noted throughout the thoracic spine. IMPRESSION: No acute cardiopulmonary disease. Electronically Signed   By: Aram Candela M.D.   On: 06/29/2023 00:38   DG Hip Unilat With Pelvis 2-3 Views Left  Result Date: 06/28/2023 CLINICAL DATA:  Fall with hip fracture EXAM: DG HIP (WITH OR WITHOUT PELVIS) 2-3V LEFT COMPARISON:  08/11/2022 FINDINGS:  Acute comminuted, displaced and angulated left intertrochanteric fracture. Femoral head projects in joint. Pubic symphysis appears intact. Faint calcifications at the right greater than left ischial tuberosity without significant change. IMPRESSION: Acute comminuted, displaced and angulated left intertrochanteric fracture. Electronically Signed   By: Jasmine Pang M.D.   On: 06/28/2023 23:25   DG Femur Min 2 Views Left  Result Date: 06/28/2023 CLINICAL DATA:  Hip fracture EXAM: LEFT FEMUR 2 VIEWS COMPARISON:  None Available. FINDINGS: Stimulator generator over the left lower quadrant. Acute comminuted and displaced left intertrochanteric fracture with mild varus angulation. No femoral head dislocation IMPRESSION: Acute comminuted, displaced and mildly angulated left intertrochanteric fracture. Electronically Signed   By: Jasmine Pang M.D.   On: 06/28/2023 23:24   DG Knee 2 Views Left  Result Date: 06/28/2023 CLINICAL DATA:  Hip problem EXAM: LEFT KNEE - 1-2 VIEW COMPARISON:  None Available. FINDINGS: Left knee replacement with intact hardware and normal alignment. Trace knee effusion. No fracture is seen IMPRESSION: Left knee replacement with trace effusion. Electronically Signed   By: Jasmine Pang M.D.   On: 06/28/2023 23:23   ECHOCARDIOGRAM COMPLETE  Result Date: 06/27/2023    ECHOCARDIOGRAM REPORT   Patient Name:   [name] Date of Exam: 06/27/2023 Medical Rec #:  [id number]        Height:       64.0 in Accession #:    [id number]       Weight:       136.0 lb Date of Birth:  [birth year]         BSA:          1.661 m Patient Age:    82 years         BP:           116/68 mmHg Patient Gender: F                HR:           60 bpm. Exam Location:  [address] Procedure: 2D Echo, 3D Echo, Cardiac Doppler, Color Doppler and Strain Analysis Indications:    R06.02 SOB  History:        Patient has prior history of Echocardiogram examinations, most                 recent 11/18/2020. HFpEF, CAD, S/p  Watchman, Arrythmias:Atrial                 Fibrillation, Signs/Symptoms:Fatigue and Shortness of Breath;                 Risk Factors:Hypertension and Diabetes. Previous TEE revealed                 LVEF 60% moderate TR.  Sonographer:    Chanetta Marshall BA, RDCS Referring Phys: 9 TESSA N CONTE IMPRESSIONS  1. Left ventricular ejection fraction, by estimation, is 60 to 65%. The left ventricle has normal function. The left ventricle has no regional wall motion abnormalities. Left ventricular diastolic parameters were normal. The average left ventricular global longitudinal strain is -17.1 %. The global longitudinal strain is normal.  2. Right ventricular systolic function is normal. The right ventricular size is normal. There is normal pulmonary artery systolic pressure. The estimated right ventricular systolic pressure is 33.2 mmHg.  3. The mitral valve is degenerative. Trivial mitral valve regurgitation. No evidence of mitral stenosis.  4. The aortic valve is tricuspid. Aortic valve regurgitation is not visualized. Aortic valve sclerosis is present, with no evidence of aortic valve stenosis.  5. The inferior vena cava is normal in size with greater than 50% respiratory variability, suggesting right atrial pressure of 3 mmHg. FINDINGS  Left Ventricle: Left ventricular ejection fraction, by estimation, is 60 to 65%. The left ventricle has normal function. The left ventricle has no regional wall motion abnormalities. The average left ventricular global longitudinal strain is -17.1 %. The global longitudinal strain is normal. The left ventricular internal cavity size was normal in size. There is no left ventricular hypertrophy. Left ventricular diastolic parameters were normal. Right Ventricle: The right ventricular size is normal. No increase in right ventricular wall thickness. Right ventricular systolic function is normal. There is normal pulmonary artery systolic pressure. The tricuspid regurgitant velocity is  2.75 m/s, and  with an assumed right atrial  pressure of 3 mmHg, the estimated right ventricular systolic pressure is 33.2 mmHg. Left Atrium: Left atrial size was normal in size. Right Atrium: Right atrial size was normal in size. Pericardium: There is no evidence of pericardial effusion. Mitral Valve: The mitral valve is degenerative in appearance. There is mild thickening of the mitral valve leaflet(s). Normal mobility of the mitral valve leaflets. Mild mitral annular calcification. Trivial mitral valve regurgitation. No evidence of mitral valve stenosis. Tricuspid Valve: The tricuspid valve is normal in structure. Tricuspid valve regurgitation is mild . No evidence of tricuspid stenosis. Aortic Valve: The aortic valve is tricuspid. Aortic valve regurgitation is not visualized. Aortic valve sclerosis is present, with no evidence of aortic valve stenosis. Pulmonic Valve: The pulmonic valve was grossly normal. Pulmonic valve regurgitation is not visualized. No evidence of pulmonic stenosis. Aorta: The aortic root is normal in size and structure. Venous: The inferior vena cava is normal in size with greater than 50% respiratory variability, suggesting right atrial pressure of 3 mmHg. IAS/Shunts: The atrial septum is grossly normal.  LEFT VENTRICLE PLAX 2D LVIDd:         4.30 cm   Diastology LVIDs:         2.70 cm   LV e' medial:    9.46 cm/s LV PW:         0.90 cm   LV E/e' medial:  10.7 LV IVS:        1.00 cm   LV e' lateral:   9.03 cm/s LVOT diam:     2.00 cm   LV E/e' lateral: 11.2 LV SV:         51 LV SV Index:   31        2D Longitudinal Strain LVOT Area:     3.14 cm  2D Strain GLS (A2C):   -16.3 %                          2D Strain GLS (A3C):   -17.6 %                          2D Strain GLS (A4C):   -17.3 %                          2D Strain GLS Avg:     -17.1 %                           3D Volume EF:                          3D EF:        61 %                          LV EDV:       119 ml                           LV ESV:       47 ml                          LV SV:        72 ml RIGHT VENTRICLE  IVC RV Basal diam:  3.20 cm    IVC diam: 1.10 cm RV S prime:     9.36 cm/s TAPSE (M-mode): 2.1 cm RVSP:           33.2 mmHg LEFT ATRIUM             Index        RIGHT ATRIUM           Index LA diam:        4.40 cm 2.65 cm/m   RA Pressure: 3.00 mmHg LA Vol (A2C):   31.6 ml 19.00 ml/m  RA Area:     12.40 cm LA Vol (A4C):   27.5 ml 16.56 ml/m  RA Volume:   28.20 ml  16.98 ml/m LA Biplane Vol: 38.9 ml 23.43 ml/m  AORTIC VALVE LVOT Vmax:   66.20 cm/s LVOT Vmean:  45.200 cm/s LVOT VTI:    0.162 m  AORTA Ao Root diam: 2.90 cm MITRAL VALVE                TRICUSPID VALVE MV Area (PHT): 3.89 cm     TR Peak grad:   30.2 mmHg MV Decel Time: 195 msec     TR Vmax:        275.00 cm/s MV E velocity: 101.00 cm/s  Estimated RAP:  3.00 mmHg MV A velocity: 27.20 cm/s   RVSP:           33.2 mmHg MV E/A ratio:  3.71                             SHUNTS                             Systemic VTI:  0.16 m                             Systemic Diam: 2.00 cm Sunit Tolia Electronically signed by Tessa Lerner Signature Date/Time: 06/27/2023/5:44:29 PM    Final     Review of Systems  HENT:  Negative for ear discharge, ear pain, hearing loss and tinnitus.   Eyes:  Negative for photophobia and pain.  Respiratory:  Negative for cough and shortness of breath.   Cardiovascular:  Negative for chest pain.  Gastrointestinal:  Negative for abdominal pain, nausea and vomiting.  Genitourinary:  Negative for dysuria, flank pain, frequency and urgency.  Musculoskeletal:  Positive for arthralgias (Left hip). Negative for back pain, myalgias and neck pain.  Neurological:  Negative for dizziness and headaches.  Hematological:  Does not bruise/bleed easily.  Psychiatric/Behavioral:  The patient is not nervous/anxious.    Blood pressure 135/89, pulse 68, temperature 98.7 F (37.1 C), temperature source Oral, resp. rate 12, height 5\' 4"  (1.626 m), weight  60.8 kg, SpO2 96%. Physical Exam Constitutional:      General: She is not in acute distress.    Appearance: She is well-developed. She is not diaphoretic.  HENT:     Head: Normocephalic and atraumatic.  Eyes:     General: No scleral icterus.       Right eye: No discharge.        Left eye: No discharge.     Conjunctiva/sclera: Conjunctivae normal.  Cardiovascular:     Rate and Rhythm: Normal rate and regular rhythm.  Pulmonary:     Effort: Pulmonary effort  is normal. No respiratory distress.  Musculoskeletal:     Cervical back: Normal range of motion.     Comments: LLE No traumatic wounds, ecchymosis, or rash  Mild TTP hip  No knee or ankle effusion  Knee stable to varus/ valgus and anterior/posterior stress  Sens DPN, SPN, TN intact  Motor EHL, ext, flex, evers 5/5  DP 2+, PT 2+, No significant edema  Skin:    General: Skin is warm and dry.  Neurological:     Mental Status: She is alert.  Psychiatric:        Mood and Affect: Mood normal.        Behavior: Behavior normal.     Assessment/Plan: Left hip fx -- Plan IMN today with Dr. Jena Gauss. Please keep NPO. Multiple medical problems including hypertension, type 2 diabetes mellitus, hyperlipidemia, chronic HFpEF, depression, PAF not anticoagulated due to GI bleeding and s/p Watchman procedure -- per primary service    Freeman Caldron, PA-C Orthopedic Surgery [phone] 06/29/2023, 9:17 AM

## 2023-06-29 NOTE — Interval H&P Note (Signed)
History and Physical Interval Note:  06/29/2023 3:05 PM  [name]  has presented today for surgery, with the diagnosis of LEFT HIP INTERTROCH FRACTURE.  The various methods of treatment have been discussed with the patient and family. After consideration of risks, benefits and other options for treatment, the patient has consented to  Procedure(s): INTRAMEDULLARY (IM) NAIL INTERTROCHANTERIC (Left) as a surgical intervention.  The patient's history has been reviewed, patient examined, no change in status, stable for surgery.  I have reviewed the patient's chart and labs.  Questions were answered to the patient's satisfaction.     Caryn Bee P Jashun Puertas

## 2023-06-30 DIAGNOSIS — S72002A Fracture of unspecified part of neck of left femur, initial encounter for closed fracture: Secondary | ICD-10-CM | POA: Diagnosis not present

## 2023-06-30 LAB — BASIC METABOLIC PANEL
Anion gap: 17 — ABNORMAL HIGH (ref 5–15)
BUN: 30 mg/dL — ABNORMAL HIGH (ref 8–23)
CO2: 20 mmol/L — ABNORMAL LOW (ref 22–32)
Calcium: 8.6 mg/dL — ABNORMAL LOW (ref 8.9–10.3)
Chloride: 98 mmol/L (ref 98–111)
Creatinine, Ser: 1.94 mg/dL — ABNORMAL HIGH (ref 0.44–1.00)
GFR, Estimated: 25 mL/min — ABNORMAL LOW (ref >60)
Glucose, Bld: 264 mg/dL — ABNORMAL HIGH (ref 70–99)
Potassium: 4.6 mmol/L (ref 3.5–5.1)
Sodium: 135 mmol/L (ref 135–145)

## 2023-06-30 LAB — VITAMIN D 25 HYDROXY (VIT D DEFICIENCY, FRACTURES): Vit D, 25-Hydroxy: 36.16 ng/mL (ref 30–100)

## 2023-06-30 LAB — GLUCOSE, CAPILLARY
Glucose-Capillary: 254 mg/dL — ABNORMAL HIGH (ref 70–99)
Glucose-Capillary: 205 mg/dL — ABNORMAL HIGH (ref 70–99)
Glucose-Capillary: 352 mg/dL — ABNORMAL HIGH (ref 70–99)
Glucose-Capillary: 288 mg/dL — ABNORMAL HIGH (ref 70–99)
Glucose-Capillary: 339 mg/dL — ABNORMAL HIGH (ref 70–99)

## 2023-06-30 LAB — CBC
HCT: 36.7 % (ref 36.0–46.0)
Hemoglobin: 11.7 g/dL — ABNORMAL LOW (ref 12.0–15.0)
MCH: 30.2 pg (ref 26.0–34.0)
MCHC: 31.9 g/dL (ref 30.0–36.0)
MCV: 94.8 fL (ref 80.0–100.0)
Platelets: 218 10*3/uL (ref 150–400)
RBC: 3.87 MIL/uL (ref 3.87–5.11)
RDW: 13.9 % (ref 11.5–15.5)
WBC: 15.5 10*3/uL — ABNORMAL HIGH (ref 4.0–10.5)
nRBC: 0.1 % (ref 0.0–0.2)

## 2023-06-30 LAB — MAGNESIUM: Magnesium: 2.1 mg/dL (ref 1.7–2.4)

## 2023-06-30 MED ORDER — SODIUM CHLORIDE 0.9 % IV SOLN: INTRAVENOUS | Status: DC

## 2023-06-30 NOTE — Plan of Care (Signed)
  Problem: Coping: Goal: Ability to adjust to condition or change in health will improve Outcome: Progressing   Problem: Fluid Volume: Goal: Ability to maintain a balanced intake and output will improve Outcome: Progressing   Problem: Health Behavior/Discharge Planning: Goal: Ability to identify and utilize available resources and services will improve Outcome: Progressing Goal: Ability to manage health-related needs will improve Outcome: Progressing   

## 2023-06-30 NOTE — Evaluation (Signed)
Occupational Therapy Evaluation Patient Details Name: [name] MRN: [id number] DOB: August 09, 1941 Today's Date: 06/30/2023   History of Present Illness Pt is a 82 yr old female who presented 06/28/23 from home due to a fall. Pt found to have a L intrertrochanteric femure fx. Pt s/p 11/1 L hip IM nailing; WBAT. PMH: HTN, DM2, HLD, HFpEF, depression, watchman procedure, kyphoplasty, hand sx, b/l TKAs, T8 Kyphoplasty   Clinical Impression   Pt presented with daughter and husband in the room. Pt agreed to complete therapy at this time and required mod assist with bed mobility and moderate assist with stand pivot transfer to chair. Pt requires set up to CGA with UE ADLS and max assist with LB ADLS. Patient will benefit from continued inpatient follow up therapy, <3 hours/day.        If plan is discharge home, recommend the following: A lot of help with walking and/or transfers;A lot of help with bathing/dressing/bathroom;Assistance with cooking/housework;Assist for transportation;Help with stairs or ramp for entrance    Functional Status Assessment  Patient has had a recent decline in their functional status and demonstrates the ability to make significant improvements in function in a reasonable and predictable amount of time.  Equipment Recommendations  None recommended by OT    Recommendations for Other Services       Precautions / Restrictions Precautions Precautions: Fall Restrictions Weight Bearing Restrictions: Yes LLE Weight Bearing: Weight bearing as tolerated      Mobility Bed Mobility Overal bed mobility: Needs Assistance Bed Mobility: Sit to Supine     Supine to sit: Mod assist, Used rails, HOB elevated Sit to supine: Mod assist   General bed mobility comments: Requires assist for trunk management and BLE into bed    Transfers Overall transfer level: Needs assistance Equipment used: Rolling walker (2 wheels) Transfers: Sit to/from Stand Sit to Stand: Mod  assist           General transfer comment: stand pivot transfer to R side      Balance Overall balance assessment: Needs assistance Sitting-balance support: Feet supported, Single extremity supported, Bilateral upper extremity supported Sitting balance-Leahy Scale: Fair Sitting balance - Comments: Pt at first starts to have a posterior lean but then with increase in time is able to remain at midline Postural control: Posterior lean                                 ADL either performed or assessed with clinical judgement   ADL Overall ADL's : Needs assistance/impaired Eating/Feeding: Set up;Sitting   Grooming: Wash/dry hands;Wash/dry face;Oral care;Set up;Sitting   Upper Body Bathing: Sitting;Contact guard assist   Lower Body Bathing: Maximal assistance;Cueing for safety;Cueing for sequencing;Sitting/lateral leans   Upper Body Dressing : Set up;Cueing for safety;Cueing for sequencing;Sitting   Lower Body Dressing: Maximal assistance;Cueing for safety;Cueing for sequencing;Sit to/from stand;Sitting/lateral leans   Toilet Transfer: Moderate assistance;Cueing for safety;Cueing for sequencing;Stand-pivot;Ambulation;Rolling walker (2 wheels)   Toileting- Clothing Manipulation and Hygiene: Maximal assistance;Total assistance;Cueing for safety;Cueing for compensatory techniques;Sit to/from stand               Vision Baseline Vision/History: 1 Wears glasses Ability to See in Adequate Light: 0 Adequate Patient Visual Report: No change from baseline Vision Assessment?: No apparent visual deficits     Perception Perception: Within Functional Limits       Praxis Praxis: WFL       Pertinent Vitals/Pain Pain  Assessment Pain Assessment: Faces Faces Pain Scale: Hurts little more Pain Location: sx site Pain Descriptors / Indicators: Grimacing Pain Intervention(s): Limited activity within patient's tolerance, Monitored during session, Repositioned      Extremity/Trunk Assessment Upper Extremity Assessment Upper Extremity Assessment: Generalized weakness   Lower Extremity Assessment Lower Extremity Assessment: Defer to PT evaluation RLE Deficits / Details: Grossly functional ROM, MMT 4/5 RLE Sensation: WNL LLE Deficits / Details: Grossly functional ROM, MMT 4/5 LLE Sensation: WNL   Cervical / Trunk Assessment Cervical / Trunk Assessment: Kyphotic   Communication Communication Communication: No apparent difficulties   Cognition Arousal: Alert Behavior During Therapy: WFL for tasks assessed/performed Overall Cognitive Status: Within Functional Limits for tasks assessed                                       General Comments       Exercises     Shoulder Instructions      Home Living Family/patient expects to be discharged to:: Private residence Living Arrangements: Spouse/significant other Available Help at Discharge: Family Type of Home: House Home Access: Stairs to enter Secretary/administrator of Steps: 2 or 5 Entrance Stairs-Rails: Right Home Layout: One level     Bathroom Shower/Tub: Producer, television/film/video: Standard Bathroom Accessibility: Yes How Accessible: Accessible via walker Home Equipment: Agricultural consultant (2 wheels);Cane - quad;Cane - single point;Shower seat - built in;Grab bars - toilet;BSC/3in1          Prior Functioning/Environment Prior Level of Function : Independent/Modified Independent             Mobility Comments: Walks independnelty          OT Problem List: Decreased strength;Decreased activity tolerance;Impaired balance (sitting and/or standing);Decreased cognition;Decreased knowledge of use of DME or AE;Cardiopulmonary status limiting activity;Pain      OT Treatment/Interventions: Self-care/ADL training;Therapeutic exercise;DME and/or AE instruction;Therapeutic activities;Patient/family education;Balance training    OT Goals(Current goals can  be found in the care plan section) Acute Rehab OT Goals Patient Stated Goal: to get better OT Goal Formulation: With patient Time For Goal Achievement: 07/14/23 Potential to Achieve Goals: Good  OT Frequency: Min 1X/week    Co-evaluation              AM-PAC OT "6 Clicks" Daily Activity     Outcome Measure Help from another person eating meals?: None Help from another person taking care of personal grooming?: A Little Help from another person toileting, which includes using toliet, bedpan, or urinal?: A Lot Help from another person bathing (including washing, rinsing, drying)?: A Lot Help from another person to put on and taking off regular upper body clothing?: A Little Help from another person to put on and taking off regular lower body clothing?: A Lot 6 Click Score: 16   End of Session Equipment Utilized During Treatment: Gait belt Nurse Communication: Mobility status  Activity Tolerance: Patient tolerated treatment well Patient left: in chair;with call bell/phone within reach;with family/visitor present  OT Visit Diagnosis: Unsteadiness on feet (R26.81);Other abnormalities of gait and mobility (R26.89);Muscle weakness (generalized) (M62.81);Pain Pain - part of body: Leg                Time: 9811-9147 OT Time Calculation (min): 56 min Charges:  OT General Charges $OT Visit: 1 Visit OT Evaluation $OT Eval Low Complexity: 1 Low OT Treatments $Self Care/Home Management : 38-52 mins  Presley Raddle  OTR/L  Acute Rehab Services  [phone] office number   Alphia Moh 06/30/2023, 3:22 PM

## 2023-06-30 NOTE — Evaluation (Signed)
Physical Therapy Evaluation Patient Details Name: [name] MRN: [id number] DOB: 1941/04/07 Today's Date: 06/30/2023  History of Present Illness  Pt is a 82 yr old female who presented 06/28/23 from home due to a fall. Pt found to have a L intrertrochanteric femure fx. Pt s/p 11/1 L hip IM nailing; WBAT. PMH: HTN, DM2, HLD, HFpEF, depression, watchman procedure, kyphoplasty, hand sx, b/l TKAs, T8 Kyphoplasty   Clinical Impression  Pt admitted with above diagnosis, POD1 with WBAT. Pt required modA for STS and SPT as well as bed mobility, pt endorsing dizziness with transfer and returned to supine positioning, nursing informed. Pt currently with functional limitations due to the deficits listed below (see PT Problem List). Patient will benefit from continued inpatient follow up therapy, <3 hours/day. Pt will benefit from acute skilled PT to increase their independence and safety with mobility to allow discharge.          If plan is discharge home, recommend the following: A little help with walking and/or transfers;A little help with bathing/dressing/bathroom;Assistance with cooking/housework;Assistance with feeding;Help with stairs or ramp for entrance;Assist for transportation   Can travel by private vehicle   Yes    Equipment Recommendations None recommended by PT  Recommendations for Other Services       Functional Status Assessment Patient has had a recent decline in their functional status and demonstrates the ability to make significant improvements in function in a reasonable and predictable amount of time.     Precautions / Restrictions Precautions Precautions: Fall Restrictions Weight Bearing Restrictions: Yes LLE Weight Bearing: Weight bearing as tolerated      Mobility  Bed Mobility Overal bed mobility: Needs Assistance Bed Mobility: Sit to Supine     Supine to sit: Mod assist, Used rails, HOB elevated Sit to supine: Mod assist   General bed mobility  comments: Requires assist for trunk management and BLE into bed    Transfers Overall transfer level: Needs assistance Equipment used: Rolling walker (2 wheels) Transfers: Sit to/from Stand Sit to Stand: Mod assist           General transfer comment: stand pivot transfer modA to prevent posterior lean    Ambulation/Gait                  Stairs            Wheelchair Mobility     Tilt Bed    Modified Rankin (Stroke Patients Only)       Balance Overall balance assessment: Needs assistance Sitting-balance support: Feet supported, Single extremity supported, Bilateral upper extremity supported Sitting balance-Leahy Scale: Fair Sitting balance - Comments: Pt at first starts to have a posterior lean but then with increase in time is able to remain at midline Postural control: Posterior lean                                   Pertinent Vitals/Pain Pain Assessment Pain Assessment: Faces Faces Pain Scale: Hurts little more Pain Location: sx site Pain Descriptors / Indicators: Grimacing Pain Intervention(s): Limited activity within patient's tolerance, Monitored during session, Repositioned    Home Living Family/patient expects to be discharged to:: Private residence Living Arrangements: Spouse/significant other Available Help at Discharge: Family Type of Home: House Home Access: Stairs to enter Entrance Stairs-Rails:  (with the 2 steps has a rail on the R  side and with the 5 steps has a rail on both  sides.) Entrance Stairs-Number of Steps: 2 or 5   Home Layout: One level Home Equipment: Agricultural consultant (2 wheels);Cane - quad;Cane - single point;Shower seat - built in;Grab bars - toilet;BSC/3in1      Prior Function Prior Level of Function : Independent/Modified Independent             Mobility Comments: Walks independnelty       Extremity/Trunk Assessment   Upper Extremity Assessment Upper Extremity Assessment: Defer to OT  evaluation    Lower Extremity Assessment Lower Extremity Assessment: RLE deficits/detail;LLE deficits/detail;Generalized weakness RLE Deficits / Details: Grossly functional ROM, MMT 4/5 RLE Sensation: WNL LLE Deficits / Details: Grossly functional ROM, MMT 4/5 LLE Sensation: WNL    Cervical / Trunk Assessment Cervical / Trunk Assessment: Kyphotic  Communication   Communication Communication: No apparent difficulties  Cognition Arousal: Alert Behavior During Therapy: WFL for tasks assessed/performed Overall Cognitive Status: Within Functional Limits for tasks assessed                                          General Comments      Exercises General Exercises - Lower Extremity Ankle Circles/Pumps: AROM, Both, 20 reps   Assessment/Plan    PT Assessment Patient needs continued PT services  PT Problem List Decreased strength;Decreased range of motion;Decreased activity tolerance       PT Treatment Interventions DME instruction;Gait training;Stair training;Functional mobility training;Therapeutic activities;Therapeutic exercise;Balance training;Patient/family education    PT Goals (Current goals can be found in the Care Plan section)  Acute Rehab PT Goals Patient Stated Goal: go home PT Goal Formulation: With patient Time For Goal Achievement: 07/14/23 Potential to Achieve Goals: Good    Frequency Min 1X/week     Co-evaluation               AM-PAC PT "6 Clicks" Mobility  Outcome Measure Help needed turning from your back to your side while in a flat bed without using bedrails?: A Lot Help needed moving from lying on your back to sitting on the side of a flat bed without using bedrails?: A Lot Help needed moving to and from a bed to a chair (including a wheelchair)?: A Lot Help needed standing up from a chair using your arms (e.g., wheelchair or bedside chair)?: A Lot Help needed to walk in hospital room?: A Lot Help needed climbing 3-5 steps  with a railing? : Total 6 Click Score: 11    End of Session Equipment Utilized During Treatment: Gait belt;Oxygen Activity Tolerance: Patient tolerated treatment well;No increased pain Patient left: in bed;with call bell/phone within reach;with family/visitor present Nurse Communication: Mobility status PT Visit Diagnosis: Other abnormalities of gait and mobility (R26.89)    Time: 9528-4132 PT Time Calculation (min) (ACUTE ONLY): 20 min   Charges:   PT Evaluation $PT Eval Low Complexity: 1 Low   PT General Charges $$ ACUTE PT VISIT: 1 Visit         Jamesetta Geralds, PT, DPT [facility] Office: [phone]  Jamesetta Geralds 06/30/2023, 1:52 PM

## 2023-06-30 NOTE — Progress Notes (Signed)
Orthopaedic Trauma Progress Note  SUBJECTIVE: Doing okay this morning.  Pain left leg well-controlled at rest.  Has not been up out of bed yet since surgery.  Notes that she is hungry, feels ready to eat her breakfast.  Had some episodes of vomiting overnight related to her cough.  Was given some Robitussin which seemed to help.  Nursing to give Zofran now prior to giving morning medications.  No chest pain. No SOB. No other complaints.  Patient's daughter at bedside.  OBJECTIVE:  Vitals:   06/29/23 2100 06/29/23 2159  BP: (!) 119/50 (!) 112/52  Pulse: 68 71  Resp: 16 19  Temp: 98 F (36.7 C)   SpO2: 96% 95%    General: Sitting up in bed, no acute distress Respiratory: No increased work of breathing.  Left lower extremity: Dressing clean, dry, intact.  Tender over the posterior lateral hip as expected.  Otherwise no significant tenderness with palpation throughout the thigh or lower leg.  No calf tenderness.  Compartment soft compressible.  Ankle dorsiflexion/plantarflexion is intact. + EHL/FHL.  Toes warm and well-perfused.  Neurovascularly intact.  IMAGING: Stable post op imaging.   LABS:  Results for orders placed or performed during the hospital encounter of 06/28/23 (from the past 24 hour(s))  Hemoglobin A1c     Status: Abnormal   Collection Time: 06/29/23  7:35 AM  Result Value Ref Range   Hgb A1c MFr Bld 6.3 (H) 4.8 - 5.6 %   Mean Plasma Glucose 134.11 mg/dL  CBC     Status: Abnormal   Collection Time: 06/29/23  7:35 AM  Result Value Ref Range   WBC 12.6 (H) 4.0 - 10.5 K/uL   RBC 4.77 3.87 - 5.11 MIL/uL   Hemoglobin 14.2 12.0 - 15.0 g/dL   HCT 08.6 57.8 - 46.9 %   MCV 91.6 80.0 - 100.0 fL   MCH 29.8 26.0 - 34.0 pg   MCHC 32.5 30.0 - 36.0 g/dL   RDW 62.9 52.8 - 41.3 %   Platelets 257 150 - 400 K/uL   nRBC 0.0 0.0 - 0.2 %  Basic metabolic panel     Status: Abnormal   Collection Time: 06/29/23  7:35 AM  Result Value Ref Range   Sodium 139 135 - 145 mmol/L   Potassium  4.6 3.5 - 5.1 mmol/L   Chloride 98 98 - 111 mmol/L   CO2 32 22 - 32 mmol/L   Glucose, Bld 182 (H) 70 - 99 mg/dL   BUN 19 8 - 23 mg/dL   Creatinine, Ser 2.44 (H) 0.44 - 1.00 mg/dL   Calcium 9.1 8.9 - 01.0 mg/dL   GFR, Estimated 48 (L) >60 mL/min   Anion gap 9 5 - 15  Glucose, capillary     Status: Abnormal   Collection Time: 06/29/23  8:15 AM  Result Value Ref Range   Glucose-Capillary 179 (H) 70 - 99 mg/dL  Glucose, capillary     Status: Abnormal   Collection Time: 06/29/23 11:33 AM  Result Value Ref Range   Glucose-Capillary 192 (H) 70 - 99 mg/dL  MRSA Next Gen by PCR, Nasal     Status: None   Collection Time: 06/29/23 12:39 PM   Specimen: Nasal Mucosa; Nasal Swab  Result Value Ref Range   MRSA by PCR Next Gen NOT DETECTED NOT DETECTED  Glucose, capillary     Status: Abnormal   Collection Time: 06/29/23  1:26 PM  Result Value Ref Range   Glucose-Capillary 179 (H) 70 -  99 mg/dL  Glucose, capillary     Status: Abnormal   Collection Time: 06/29/23  3:39 PM  Result Value Ref Range   Glucose-Capillary 158 (H) 70 - 99 mg/dL  Glucose, capillary     Status: Abnormal   Collection Time: 06/29/23  5:05 PM  Result Value Ref Range   Glucose-Capillary 206 (H) 70 - 99 mg/dL   Comment 1 Notify RN   Glucose, capillary     Status: Abnormal   Collection Time: 06/29/23  8:12 PM  Result Value Ref Range   Glucose-Capillary 239 (H) 70 - 99 mg/dL  Glucose, capillary     Status: Abnormal   Collection Time: 06/29/23 11:56 PM  Result Value Ref Range   Glucose-Capillary 261 (H) 70 - 99 mg/dL  CBC     Status: Abnormal   Collection Time: 06/30/23  3:09 AM  Result Value Ref Range   WBC 15.5 (H) 4.0 - 10.5 K/uL   RBC 3.87 3.87 - 5.11 MIL/uL   Hemoglobin 11.7 (L) 12.0 - 15.0 g/dL   HCT 46.9 62.9 - 52.8 %   MCV 94.8 80.0 - 100.0 fL   MCH 30.2 26.0 - 34.0 pg   MCHC 31.9 30.0 - 36.0 g/dL   RDW 41.3 24.4 - 01.0 %   Platelets 218 150 - 400 K/uL   nRBC 0.1 0.0 - 0.2 %  Basic metabolic panel      Status: Abnormal   Collection Time: 06/30/23  3:09 AM  Result Value Ref Range   Sodium 135 135 - 145 mmol/L   Potassium 4.6 3.5 - 5.1 mmol/L   Chloride 98 98 - 111 mmol/L   CO2 20 (L) 22 - 32 mmol/L   Glucose, Bld 264 (H) 70 - 99 mg/dL   BUN 30 (H) 8 - 23 mg/dL   Creatinine, Ser 2.72 (H) 0.44 - 1.00 mg/dL   Calcium 8.6 (L) 8.9 - 10.3 mg/dL   GFR, Estimated 25 (L) >60 mL/min   Anion gap 17 (H) 5 - 15  Magnesium     Status: None   Collection Time: 06/30/23  3:09 AM  Result Value Ref Range   Magnesium 2.1 1.7 - 2.4 mg/dL  Glucose, capillary     Status: Abnormal   Collection Time: 06/30/23  4:12 AM  Result Value Ref Range   Glucose-Capillary 254 (H) 70 - 99 mg/dL    ASSESSMENT: [name] is a 82 y.o. female, 1 Day Post-Op s/p INTRAMEDULLARY NAIL LEFT INTERTROCHANTERIC FEMUR FRACTURE   CV/Blood loss: Acute blood loss anemia, Hgb 11.7 this morning. Hemodynamically stable  PLAN: Weightbearing: WBAT LLE ROM: Okay for unrestricted motion Incisional and dressing care: Reinforce dressings as needed  Showering: Okay to begin showering and getting incisions wet 07/02/2023 Orthopedic device(s): None  Pain management: Continue current multimodal regimen VTE prophylaxis: Aspirin, SCDs ID:  Ancef 2gm post op Foley/Lines:  No foley, KVO IVFs Impediments to Fracture Healing: Diabetes (last hgb A1c 6.3 on 06/29/2023). Vitamin D level pending, will start supplementation as indicated. Dispo: PT/OT evaluation today, dispo pending.  Patient may require SNF at discharge.  Plan to remove dressings from LLE tomorrow 07/01/2023.   D/C recommendations: -Oxycodone 5 mg, Robaxin for pain control -Aspirin 325 mg x 30 days for DVT prophylaxis -Possible need for Vit D supplementation  Follow - up plan: 2 weeks after discharge for wound check and repeat x-rays   Contact information:  Truitt Merle MD, Thyra Breed PA-C. After hours and holidays please check [email or web address] for  group call information for  [facility]   Thompson Caul, PA-C [phone] (office) [email or web address]

## 2023-06-30 NOTE — Progress Notes (Addendum)
PROGRESS NOTE    [name]  MWN:[id number] DOB: August 17, 1941 DOA: 06/28/2023 PCP: Emilio Aspen, MD   Brief Narrative:  82 y.o. female with medical history significant for hypertension, type 2 diabetes mellitus, hyperlipidemia, chronic HFpEF, depression, and PAF not anticoagulated due to GI bleeding and s/p Watchman procedure presented with left leg pain after fall at home and was found to have left intertrochanteric femur fracture.  She underwent surgical intervention on 06/29/2023.  Assessment & Plan:   Left intertrochanteric femur fracture after a fall -underwent surgical intervention on 06/29/2023. Pain management/wound care/DVT prophylaxis/activity as per orthopedics recommendations.  Fall precautions.  -PT/OT eval  Paroxysmal A-fib - Could not tolerate anticoagulation d/t GI bleeding and is s/p Wathcman procedure  - Continue metoprolol.  Currently rate controlled  AKI on CKD stage IIIa -Creatinine has worsened to 1.94 today.  Start gentle hydration.  Monitor.  Leukocytosis -Possibly reactive.  WBC is 15.5 today.  Monitor  Hypertension -Continue metoprolol.  Monitor blood pressure  Diabetes mellitus type 2 -Continue CBGs with SSI  Chronic diastolic heart failure -Currently compensated.  Strict input and output.  Daily weights.  Continue metoprolol.  Lasix on hold  Depression -Continue Effexor   DVT prophylaxis: SCDs Code Status: Full Family Communication: Daughter at bedside Disposition Plan: Status is: Inpatient Remains inpatient appropriate because: Of severity of illness    Consultants: Orthopedics  Procedures: As above  Antimicrobials: Perioperative  Subjective: Patient seen and examined at bedside.  Denies any current nausea or vomiting.  Continues to have intermittent left hip pain. Objective: Vitals:   06/29/23 2100 06/29/23 2159 06/30/23 0734 06/30/23 0734  BP: (!) 119/50 (!) 112/52 (!) 124/58 (!) 124/58  Pulse: 68 71 87 87  Resp: 16  19 16 16   Temp: 98 F (36.7 C)  98.3 F (36.8 C) 98.3 F (36.8 C)  TempSrc: Oral  Oral Oral  SpO2: 96% 95%  97%  Weight:      Height:        Intake/Output Summary (Last 24 hours) at 06/30/2023 0749 Last data filed at 06/30/2023 0351 Gross per 24 hour  Intake 250 ml  Output 400 ml  Net -150 ml   Filed Weights   06/28/23 2037  Weight: 60.8 kg    Examination:  General: Currently on 2 L oxygen via nasal cannula.  No distress. ENT/neck: No thyromegaly.  JVD is not elevated  respiratory: Decreased breath sounds at bases bilaterally with some crackles; no wheezing CVS: S1-S2 heard, rate controlled currently Abdominal: Soft, nontender, slightly distended; no organomegaly, bowel sounds are heard Extremities: Trace lower extremity edema; no cyanosis  CNS: Awake and alert.  No focal neurologic deficit.  Moves extremities Lymph: No obvious lymphadenopathy Skin: No obvious ecchymosis/lesions  psych: Flat affect.  Not agitated.    Data Reviewed: I have personally reviewed following labs and imaging studies  CBC: Recent Labs  Lab 06/28/23 2034 06/29/23 0735 06/30/23 0309  WBC 14.4* 12.6* 15.5*  NEUTROABS 11.8*  --   --   HGB 14.8 14.2 11.7*  HCT 46.7* 43.7 36.7  MCV 91.6 91.6 94.8  PLT 253 257 218   Basic Metabolic Panel: Recent Labs  Lab 06/28/23 2034 06/29/23 0735 06/30/23 0309  NA 138 139 135  K 3.5 4.6 4.6  CL 97* 98 98  CO2 27 32 20*  GLUCOSE 107* 182* 264*  BUN 19 19 30*  CREATININE 1.09* 1.15* 1.94*  CALCIUM 9.2 9.1 8.6*  MG  --   --  2.1  GFR: Estimated Creatinine Clearance: 19.3 mL/min (A) (by C-G formula based on SCr of 1.94 mg/dL (H)). Liver Function Tests: No results for input(s): "AST", "ALT", "ALKPHOS", "BILITOT", "PROT", "ALBUMIN" in the last 168 hours. No results for input(s): "LIPASE", "AMYLASE" in the last 168 hours. No results for input(s): "AMMONIA" in the last 168 hours. Coagulation Profile: Recent Labs  Lab 06/28/23 2034  INR 1.1    Cardiac Enzymes: No results for input(s): "CKTOTAL", "CKMB", "CKMBINDEX", "TROPONINI" in the last 168 hours. BNP (last 3 results) Recent Labs    06/05/23 1523  PROBNP 1,103*   HbA1C: Recent Labs    06/29/23 0735  HGBA1C 6.3*   CBG: Recent Labs  Lab 06/29/23 1705 06/29/23 2012 06/29/23 2356 06/30/23 0412 06/30/23 0737  GLUCAP 206* 239* 261* 254* 205*   Lipid Profile: No results for input(s): "CHOL", "HDL", "LDLCALC", "TRIG", "CHOLHDL", "LDLDIRECT" in the last 72 hours. Thyroid Function Tests: No results for input(s): "TSH", "T4TOTAL", "FREET4", "T3FREE", "THYROIDAB" in the last 72 hours. Anemia Panel: No results for input(s): "VITAMINB12", "FOLATE", "FERRITIN", "TIBC", "IRON", "RETICCTPCT" in the last 72 hours. Sepsis Labs: No results for input(s): "PROCALCITON", "LATICACIDVEN" in the last 168 hours.  Recent Results (from the past 240 hour(s))  MRSA Next Gen by PCR, Nasal     Status: None   Collection Time: 06/29/23 12:39 PM   Specimen: Nasal Mucosa; Nasal Swab  Result Value Ref Range Status   MRSA by PCR Next Gen NOT DETECTED NOT DETECTED Final    Comment: (NOTE) The GeneXpert MRSA Assay (FDA approved for NASAL specimens only), is one component of a comprehensive MRSA colonization surveillance program. It is not intended to diagnose MRSA infection nor to guide or monitor treatment for MRSA infections. Test performance is not FDA approved in patients less than 38 years old. Performed at [facility], [address] [place], [place] [address]          Radiology Studies: DG FEMUR PORT MIN 2 VIEWS LEFT  Result Date: 06/29/2023 CLINICAL DATA:  Postop. EXAM: LEFT FEMUR PORTABLE 2 VIEWS COMPARISON:  Preoperative imaging FINDINGS: Femoral intramedullary nail with trans trochanteric and distal locking screw fixation traverse comminuted intertrochanteric femur fracture. Improved fracture alignment from preoperative imaging. Persistent displacement of lesser  trochanteric fracture fragment. Recent postsurgical change includes air and edema in the soft tissues. Knee arthroplasty. IMPRESSION: ORIF of comminuted intertrochanteric femur fracture with improved alignment. Electronically Signed   By: Narda Rutherford M.D.   On: 06/29/2023 18:42   DG FEMUR MIN 2 VIEWS LEFT  Result Date: 06/29/2023 CLINICAL DATA:  Elective surgery. EXAM: LEFT FEMUR 2 VIEWS COMPARISON:  Preoperative imaging FINDINGS: Six fluoroscopic spot views of the left femur obtained in the operating room. Femoral intramedullary nail with trans trochanteric and distal locking screw fixation traverse proximal femur fracture. Fluoroscopy time 1 minute 13 seconds. Dose 6.35 mGy. IMPRESSION: Intraoperative fluoroscopy during proximal femur fracture fixation. Electronically Signed   By: Narda Rutherford M.D.   On: 06/29/2023 18:41   DG C-Arm 1-60 Min-No Report  Result Date: 06/29/2023 Fluoroscopy was utilized by the requesting physician.  No radiographic interpretation.   Chest Portable 1 View  Result Date: 06/29/2023 CLINICAL DATA:  Preoperative evaluation. EXAM: PORTABLE CHEST 1 VIEW COMPARISON:  November 19, 2020 FINDINGS: The heart size and mediastinal contours are within normal limits. There is marked severity calcification of the aortic arch. Both lungs are clear. Stable spinal stimulator wire positioning is seen with multilevel degenerative changes noted throughout the thoracic spine.  IMPRESSION: No acute cardiopulmonary disease. Electronically Signed   By: Aram Candela M.D.   On: 06/29/2023 00:38   DG Hip Unilat With Pelvis 2-3 Views Left  Result Date: 06/28/2023 CLINICAL DATA:  Fall with hip fracture EXAM: DG HIP (WITH OR WITHOUT PELVIS) 2-3V LEFT COMPARISON:  08/11/2022 FINDINGS: Acute comminuted, displaced and angulated left intertrochanteric fracture. Femoral head projects in joint. Pubic symphysis appears intact. Faint calcifications at the right greater than left ischial tuberosity  without significant change. IMPRESSION: Acute comminuted, displaced and angulated left intertrochanteric fracture. Electronically Signed   By: Jasmine Pang M.D.   On: 06/28/2023 23:25   DG Femur Min 2 Views Left  Result Date: 06/28/2023 CLINICAL DATA:  Hip fracture EXAM: LEFT FEMUR 2 VIEWS COMPARISON:  None Available. FINDINGS: Stimulator generator over the left lower quadrant. Acute comminuted and displaced left intertrochanteric fracture with mild varus angulation. No femoral head dislocation IMPRESSION: Acute comminuted, displaced and mildly angulated left intertrochanteric fracture. Electronically Signed   By: Jasmine Pang M.D.   On: 06/28/2023 23:24   DG Knee 2 Views Left  Result Date: 06/28/2023 CLINICAL DATA:  Hip problem EXAM: LEFT KNEE - 1-2 VIEW COMPARISON:  None Available. FINDINGS: Left knee replacement with intact hardware and normal alignment. Trace knee effusion. No fracture is seen IMPRESSION: Left knee replacement with trace effusion. Electronically Signed   By: Jasmine Pang M.D.   On: 06/28/2023 23:23        Scheduled Meds:  aspirin  325 mg Oral Daily   docusate sodium  100 mg Oral BID   famotidine  40 mg Oral Daily   feeding supplement  237 mL Oral BID BM   gabapentin  300 mg Oral Daily   And   gabapentin  600 mg Oral QHS   insulin aspart  0-6 Units Subcutaneous Q4H   metoprolol tartrate  100 mg Oral BID   multivitamin with minerals  1 tablet Oral Daily   venlafaxine  50 mg Oral BID WC   Continuous Infusions:   ceFAZolin (ANCEF) IV 2 g (06/30/23 0708)          Glade Lloyd, MD [facility] 06/30/2023, 7:49 AM

## 2023-06-30 NOTE — Anesthesia Postprocedure Evaluation (Signed)
Anesthesia Post Note  Patient: [name]  Procedure(s) Performed: INTRAMEDULLARY (IM) NAIL INTERTROCHANTERIC (Left)     Patient location during evaluation: PACU Anesthesia Type: General Level of consciousness: awake and alert Pain management: pain level controlled Vital Signs Assessment: post-procedure vital signs reviewed and stable Respiratory status: spontaneous breathing, nonlabored ventilation, respiratory function stable and patient connected to nasal cannula oxygen Cardiovascular status: blood pressure returned to baseline and stable Postop Assessment: no apparent nausea or vomiting Anesthetic complications: no   No notable events documented.  Last Vitals:  Vitals:   06/29/23 2100 06/29/23 2159  BP: (!) 119/50 (!) 112/52  Pulse: 68 71  Resp: 16 19  Temp: 36.7 C   SpO2: 96% 95%    Last Pain:  Vitals:   06/29/23 2159  TempSrc:   PainSc: 0-No pain                 Brandie Lopes

## 2023-07-01 ENCOUNTER — Encounter (HOSPITAL_COMMUNITY): Payer: Self-pay | Admitting: Student

## 2023-07-01 DIAGNOSIS — S72002A Fracture of unspecified part of neck of left femur, initial encounter for closed fracture: Secondary | ICD-10-CM | POA: Diagnosis not present

## 2023-07-01 LAB — CBC
HCT: 23.8 % — ABNORMAL LOW (ref 36.0–46.0)
HCT: 23.8 % — ABNORMAL LOW (ref 36.0–46.0)
Hemoglobin: 7.7 g/dL — ABNORMAL LOW (ref 12.0–15.0)
Hemoglobin: 7.8 g/dL — ABNORMAL LOW (ref 12.0–15.0)
MCH: 29.5 pg (ref 26.0–34.0)
MCH: 30.2 pg (ref 26.0–34.0)
MCHC: 32.4 g/dL (ref 30.0–36.0)
MCHC: 32.8 g/dL (ref 30.0–36.0)
MCV: 91.2 fL (ref 80.0–100.0)
MCV: 92.2 fL (ref 80.0–100.0)
Platelets: 138 10*3/uL — ABNORMAL LOW (ref 150–400)
Platelets: 135 10*3/uL — ABNORMAL LOW (ref 150–400)
RBC: 2.61 MIL/uL — ABNORMAL LOW (ref 3.87–5.11)
RBC: 2.58 MIL/uL — ABNORMAL LOW (ref 3.87–5.11)
RDW: 13.6 % (ref 11.5–15.5)
RDW: 13.6 % (ref 11.5–15.5)
WBC: 12.4 10*3/uL — ABNORMAL HIGH (ref 4.0–10.5)
WBC: 12.3 10*3/uL — ABNORMAL HIGH (ref 4.0–10.5)
nRBC: 0 % (ref 0.0–0.2)
nRBC: 0 % (ref 0.0–0.2)

## 2023-07-01 LAB — BASIC METABOLIC PANEL
Anion gap: 9 (ref 5–15)
BUN: 39 mg/dL — ABNORMAL HIGH (ref 8–23)
CO2: 27 mmol/L (ref 22–32)
Calcium: 8.3 mg/dL — ABNORMAL LOW (ref 8.9–10.3)
Chloride: 98 mmol/L (ref 98–111)
Creatinine, Ser: 1.76 mg/dL — ABNORMAL HIGH (ref 0.44–1.00)
GFR, Estimated: 29 mL/min — ABNORMAL LOW (ref >60)
Glucose, Bld: 249 mg/dL — ABNORMAL HIGH (ref 70–99)
Potassium: 3.7 mmol/L (ref 3.5–5.1)
Sodium: 134 mmol/L — ABNORMAL LOW (ref 135–145)

## 2023-07-01 LAB — GLUCOSE, CAPILLARY
Glucose-Capillary: 180 mg/dL — ABNORMAL HIGH (ref 70–99)
Glucose-Capillary: 187 mg/dL — ABNORMAL HIGH (ref 70–99)
Glucose-Capillary: 189 mg/dL — ABNORMAL HIGH (ref 70–99)
Glucose-Capillary: 241 mg/dL — ABNORMAL HIGH (ref 70–99)
Glucose-Capillary: 254 mg/dL — ABNORMAL HIGH (ref 70–99)
Glucose-Capillary: 281 mg/dL — ABNORMAL HIGH (ref 70–99)
Glucose-Capillary: 282 mg/dL — ABNORMAL HIGH (ref 70–99)

## 2023-07-01 LAB — MAGNESIUM: Magnesium: 2.4 mg/dL (ref 1.7–2.4)

## 2023-07-01 MED ORDER — ASPIRIN 81 MG PO TBEC
81.0000 mg | DELAYED_RELEASE_TABLET | Freq: Every day | ORAL | Status: DC
  Administered 2023-07-01 – 2023-07-03 (×3): 81 mg via ORAL
  Filled 2023-07-01 (×3): qty 1

## 2023-07-01 MED ORDER — OXYCODONE HCL 5 MG PO TABS: 5.0000 mg | ORAL_TABLET | ORAL | 0 refills | Status: DC | PRN

## 2023-07-01 MED ORDER — METHOCARBAMOL 500 MG PO TABS: 500.0000 mg | ORAL_TABLET | Freq: Three times a day (TID) | ORAL | 0 refills | Status: DC | PRN

## 2023-07-01 MED ORDER — OXYCODONE HCL 5 MG PO TABS: 5.0000 mg | ORAL_TABLET | ORAL | 0 refills | Status: AC | PRN

## 2023-07-01 MED ORDER — INSULIN GLARGINE-YFGN 100 UNIT/ML ~~LOC~~ SOLN
15.0000 [IU] | Freq: Every day | SUBCUTANEOUS | Status: DC
  Administered 2023-07-01 – 2023-07-03 (×3): 15 [IU] via SUBCUTANEOUS
  Filled 2023-07-01 (×3): qty 0.15

## 2023-07-01 MED ORDER — VITAMIN D 25 MCG (1000 UNIT) PO TABS
1000.0000 [IU] | ORAL_TABLET | Freq: Every day | ORAL | Status: DC
  Administered 2023-07-01 – 2023-07-03 (×3): 1000 [IU] via ORAL
  Filled 2023-07-01 (×3): qty 1

## 2023-07-01 MED ORDER — METHOCARBAMOL 500 MG PO TABS: 500.0000 mg | ORAL_TABLET | Freq: Three times a day (TID) | ORAL | 0 refills | Status: AC | PRN

## 2023-07-01 NOTE — Progress Notes (Signed)
PROGRESS NOTE    [name]  MVH:[id number] DOB: 11-20-1940 DOA: 06/28/2023 PCP: Emilio Aspen, MD   Brief Narrative:  82 y.o. female with medical history significant for hypertension, type 2 diabetes mellitus, hyperlipidemia, chronic HFpEF, depression, and PAF not anticoagulated due to GI bleeding and s/p Watchman procedure presented with left leg pain after fall at home and was found to have left intertrochanteric femur fracture.  She underwent surgical intervention on 06/29/2023.  Assessment & Plan:   Left intertrochanteric femur fracture after a fall -underwent surgical intervention on 06/29/2023. Pain management/wound care/DVT prophylaxis/activity as per orthopedics recommendations.  Fall precautions.  -PT/OT recommend SNF placement.  Consult TOC.  Paroxysmal A-fib - Could not tolerate anticoagulation d/t GI bleeding and is s/p Wathcman procedure  - Continue metoprolol.  Currently rate controlled  AKI on CKD stage IIIa -Creatinine improving to 1.76 today.  Continue gentle hydration.  Monitor.  Acute blood loss anemia -Possibly from femur fracture and perioperative.  Hemoglobin 7.8 this morning.  Monitor.  Transfuse if hemoglobin is less than 7  Thrombocytopenia -Questionable cause.  Monitor.  Hyponatremia -Mild.  Monitor.  Leukocytosis -Possibly reactive.  WBC improving to 12.3 today.  Monitor  Hypertension -Continue metoprolol.  Monitor blood pressure  Diabetes mellitus type 2 with hyperglycemia -Continue CBGs with SSI.  Start long-acting insulin.  Chronic diastolic heart failure -Currently compensated.  Strict input and output.  Daily weights.  Continue metoprolol.  Lasix on hold  Depression -Continue Effexor   DVT prophylaxis: SCDs Code Status: Full Family Communication: Husband at bedside  Disposition Plan: Status is: Inpatient Remains inpatient appropriate because: Of severity of illness    Consultants: Orthopedics  Procedures: As  above  Antimicrobials: Perioperative  Subjective: Patient seen and examined at bedside.  No fever, chest pain or vomiting reported.  Has intermittent left hip pain.   Objective: Vitals:   06/30/23 2108 07/01/23 0418 07/01/23 0440 07/01/23 0746  BP: (!) 134/43 (!) 117/35 (!) 120/52 (!) 118/47  Pulse: 75 65  73  Resp:  16  18  Temp:  97.7 F (36.5 C)  98.1 F (36.7 C)  TempSrc:  Oral  Oral  SpO2:  99%  99%  Weight:      Height:        Intake/Output Summary (Last 24 hours) at 07/01/2023 0803 Last data filed at 06/30/2023 1950 Gross per 24 hour  Intake --  Output 200 ml  Net -200 ml   Filed Weights   06/28/23 2037  Weight: 60.8 kg    Examination:  General: No acute distress.  Remains on 2 L oxygen via nasal cannula.   ENT/neck: No palpable neck masses or JVD elevation noted  respiratory: Decreased breath sounds at bases with some crackles  CVS: Rate mostly controlled; S1 and S2 heard  abdominal: Soft, nontender, distended mildly; no organomegaly, bowel sounds are heard normally Extremities: No clubbing; mild lower extremity edema present CNS: Alert and oriented.  No focal neurologic deficit.  Able to moves extremities Lymph: No obvious palpable lymphadenopathy Skin: No obvious petechiae/lesions  psych: Not agitated currently.  Affect is mostly flat.   Data Reviewed: I have personally reviewed following labs and imaging studies  CBC: Recent Labs  Lab 06/28/23 2034 06/29/23 0735 06/30/23 0309 07/01/23 0315 07/01/23 0650  WBC 14.4* 12.6* 15.5* 12.4* 12.3*  NEUTROABS 11.8*  --   --   --   --   HGB 14.8 14.2 11.7* 7.7* 7.8*  HCT 46.7* 43.7 36.7 23.8* 23.8*  MCV 91.6 91.6 94.8 91.2 92.2  PLT 253 257 218 138* 135*   Basic Metabolic Panel: Recent Labs  Lab 06/28/23 2034 06/29/23 0735 06/30/23 0309 07/01/23 0315  NA 138 139 135 134*  K 3.5 4.6 4.6 3.7  CL 97* 98 98 98  CO2 27 32 20* 27  GLUCOSE 107* 182* 264* 249*  BUN 19 19 30* 39*  CREATININE 1.09*  1.15* 1.94* 1.76*  CALCIUM 9.2 9.1 8.6* 8.3*  MG  --   --  2.1 2.4   GFR: Estimated Creatinine Clearance: 21.3 mL/min (A) (by C-G formula based on SCr of 1.76 mg/dL (H)). Liver Function Tests: No results for input(s): "AST", "ALT", "ALKPHOS", "BILITOT", "PROT", "ALBUMIN" in the last 168 hours. No results for input(s): "LIPASE", "AMYLASE" in the last 168 hours. No results for input(s): "AMMONIA" in the last 168 hours. Coagulation Profile: Recent Labs  Lab 06/28/23 2034  INR 1.1   Cardiac Enzymes: No results for input(s): "CKTOTAL", "CKMB", "CKMBINDEX", "TROPONINI" in the last 168 hours. BNP (last 3 results) Recent Labs    06/05/23 1523  PROBNP 1,103*   HbA1C: Recent Labs    06/29/23 0735  HGBA1C 6.3*   CBG: Recent Labs  Lab 06/30/23 1618 06/30/23 1948 07/01/23 0002 07/01/23 0413 07/01/23 0744  GLUCAP 288* 339* 281* 241* 189*   Lipid Profile: No results for input(s): "CHOL", "HDL", "LDLCALC", "TRIG", "CHOLHDL", "LDLDIRECT" in the last 72 hours. Thyroid Function Tests: No results for input(s): "TSH", "T4TOTAL", "FREET4", "T3FREE", "THYROIDAB" in the last 72 hours. Anemia Panel: No results for input(s): "VITAMINB12", "FOLATE", "FERRITIN", "TIBC", "IRON", "RETICCTPCT" in the last 72 hours. Sepsis Labs: No results for input(s): "PROCALCITON", "LATICACIDVEN" in the last 168 hours.  Recent Results (from the past 240 hour(s))  MRSA Next Gen by PCR, Nasal     Status: None   Collection Time: 06/29/23 12:39 PM   Specimen: Nasal Mucosa; Nasal Swab  Result Value Ref Range Status   MRSA by PCR Next Gen NOT DETECTED NOT DETECTED Final    Comment: (NOTE) The GeneXpert MRSA Assay (FDA approved for NASAL specimens only), is one component of a comprehensive MRSA colonization surveillance program. It is not intended to diagnose MRSA infection nor to guide or monitor treatment for MRSA infections. Test performance is not FDA approved in patients less than 37  years old. Performed at [facility], [address] [place], [place] [address]          Radiology Studies: DG FEMUR PORT MIN 2 VIEWS LEFT  Result Date: 06/29/2023 CLINICAL DATA:  Postop. EXAM: LEFT FEMUR PORTABLE 2 VIEWS COMPARISON:  Preoperative imaging FINDINGS: Femoral intramedullary nail with trans trochanteric and distal locking screw fixation traverse comminuted intertrochanteric femur fracture. Improved fracture alignment from preoperative imaging. Persistent displacement of lesser trochanteric fracture fragment. Recent postsurgical change includes air and edema in the soft tissues. Knee arthroplasty. IMPRESSION: ORIF of comminuted intertrochanteric femur fracture with improved alignment. Electronically Signed   By: Narda Rutherford M.D.   On: 06/29/2023 18:42   DG FEMUR MIN 2 VIEWS LEFT  Result Date: 06/29/2023 CLINICAL DATA:  Elective surgery. EXAM: LEFT FEMUR 2 VIEWS COMPARISON:  Preoperative imaging FINDINGS: Six fluoroscopic spot views of the left femur obtained in the operating room. Femoral intramedullary nail with trans trochanteric and distal locking screw fixation traverse proximal femur fracture. Fluoroscopy time 1 minute 13 seconds. Dose 6.35 mGy. IMPRESSION: Intraoperative fluoroscopy during proximal femur fracture fixation. Electronically Signed   By: Ivette Loyal.D.  On: 06/29/2023 18:41   DG C-Arm 1-60 Min-No Report  Result Date: 06/29/2023 Fluoroscopy was utilized by the requesting physician.  No radiographic interpretation.        Scheduled Meds:  aspirin EC  81 mg Oral Daily   docusate sodium  100 mg Oral BID   famotidine  40 mg Oral Daily   feeding supplement  237 mL Oral BID BM   gabapentin  300 mg Oral Daily   And   gabapentin  600 mg Oral QHS   insulin aspart  0-6 Units Subcutaneous Q4H   metoprolol tartrate  100 mg Oral BID   multivitamin with minerals  1 tablet Oral Daily   venlafaxine  50 mg Oral BID WC   Continuous Infusions:   sodium chloride 75 mL/hr at 06/30/23 2141          Glade Lloyd, MD [facility] 07/01/2023, 8:03 AM

## 2023-07-01 NOTE — [place] FL2 (Signed)
[place] MEDICAID FL2 LEVEL OF CARE FORM     IDENTIFICATION  Patient Name: [name] Birthdate: 1941/05/03 Sex: female Admission Date (Current Location): 06/28/2023  [facility] and [place] Number:  Producer, television/film/video and Address:  [facility] [place], [address] [place], [place] [address]      Provider Number:    Attending Physician Name and Address:  Glade Lloyd, MD  Relative Name and Phone Number:  Verlene Mayer Borenstein [phone]    Current Level of Care: Hospital Recommended Level of Care: Skilled Nursing Facility Prior Approval Number:    Date Approved/Denied:   PASRR Number: pending  Discharge Plan: SNF    Current Diagnoses: Patient Active Problem List   Diagnosis Date Noted   Closed left hip fracture, initial encounter (HCC) 06/28/2023   Fatigue 02/21/2022   Hearing difficulty 09/22/2021   Pain in right hand 06/15/2021   Arthritis of hand 04/14/2021   Physical deconditioning 12/06/2020   Hypertensive crisis 11/19/2020   Abnormality of left atrial appendage 09/06/2020   Anxiety 09/06/2020   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) 09/06/2020   CAD (coronary artery disease) 09/06/2020   Chronic kidney disease, stage 3 unspecified (HCC) 09/06/2020   (HFpEF) heart failure with preserved ejection fraction (HCC) 09/06/2020   Diabetic retinopathy associated with type 2 diabetes mellitus (HCC) 09/06/2020   Gastroesophageal reflux disease with esophagitis 09/06/2020   Hypomagnesemia 09/06/2020   Infectious colitis, enteritis and gastroenteritis 09/06/2020   Insomnia 09/06/2020   Long term (current) use of insulin (HCC) 09/06/2020   Other specified disorders of bone density and structure, other site 09/06/2020   Peripheral venous insufficiency 09/06/2020   History of colonic polyps 09/06/2020   Personal history of other diseases of the digestive system 09/06/2020   Presence of Watchman left atrial appendage closure  device 09/06/2020   Pulmonary nodule 09/06/2020   Recurrent falls 09/06/2020   Recurrent major depression (HCC) 09/06/2020   Slow transit constipation 09/06/2020   Upper abdominal pain 09/06/2020   Secondary hypercoagulable state (HCC) 08/05/2020   Acute on chronic respiratory failure with hypoxemia (HCC) 07/22/2020   Pleural effusion 07/22/2020   SOB (shortness of breath) 07/12/2020   ABLA (acute blood loss anemia) 07/12/2020   Paroxysmal atrial fibrillation (HCC) 04/14/2020   Thoracic back pain 12/16/2019   Muscle weakness 05/01/2018   Impairment of balance 04/25/2018   S/P left TKA 11/05/2017   S/P total knee replacement 11/05/2017   Osteoarthritis 09/06/2017   Pain in left knee 09/06/2017   Essential hypertension 09/06/2017   Diabetes mellitus (HCC) 12/25/2016   Hypercholesterolemia 12/25/2016   Ptosis of eyelid 10/09/2016   Iron deficiency anemia 12/15/2015   Acute GI bleeding 05/13/2015   Acute blood loss anemia 05/13/2015   Normocytic anemia 05/13/2015   Allergic rhinitis 04/06/2015   Asthma, chronic 09/08/2013   Hypoxia 09/08/2013   HTN (hypertension) 09/08/2013   Depression 09/08/2013   DM type 2 with diabetic peripheral neuropathy (HCC) 09/08/2013   Polyneuropathy due to type 2 diabetes mellitus (HCC) 09/08/2013   Flu-like symptoms 09/07/2013    Orientation RESPIRATION BLADDER Height & Weight     Self, Time, Situation, Place  Normal Incontinent Weight: 134 lb (60.8 kg) Height:  5\' 4"  (162.6 cm)  BEHAVIORAL SYMPTOMS/MOOD NEUROLOGICAL BOWEL NUTRITION STATUS      Continent Diet  AMBULATORY STATUS COMMUNICATION OF NEEDS Skin   Extensive Assist Verbally Normal  Personal Care Assistance Level of Assistance  Bathing, Feeding, Dressing Bathing Assistance: Limited assistance Feeding assistance: Independent Dressing Assistance: Limited assistance     Functional Limitations Info  Sight, Hearing, Speech Sight Info: Adequate Hearing  Info: Adequate Speech Info: Adequate    SPECIAL CARE FACTORS FREQUENCY  OT (By licensed OT), PT (By licensed PT)     PT Frequency: 5x per week OT Frequency: 5x per week            Contractures Contractures Info: Not present    Additional Factors Info  Code Status Code Status Info: full code             Current Medications (07/01/2023):  This is the current hospital active medication list Current Facility-Administered Medications  Medication Dose Route Frequency Provider Last Rate Last Admin   0.9 %  sodium chloride infusion   Intravenous Continuous Glade Lloyd, MD 75 mL/hr at 06/30/23 2141 New Bag at 06/30/23 2141   acetaminophen (TYLENOL) tablet 1,000 mg  1,000 mg Oral Q8H PRN [place] Bali, PA-C   1,000 mg at 06/29/23 2113   aspirin EC tablet 81 mg  81 mg Oral Daily [place] Bali, PA-C   81 mg at 07/01/23 0802   bisacodyl (DULCOLAX) EC tablet 5 mg  5 mg Oral Daily PRN [place] Bali, PA-C       diphenhydrAMINE (BENADRYL) 12.5 MG/5ML elixir 12.5-25 mg  12.5-25 mg Oral Q4H PRN [place] Bali, PA-C   25 mg at 07/01/23 0011   docusate sodium (COLACE) capsule 100 mg  100 mg Oral BID [place] Bali, PA-C   100 mg at 07/01/23 0802   famotidine (PEPCID) tablet 40 mg  40 mg Oral Daily [place] Bali, PA-C   40 mg at 07/01/23 0802   feeding supplement (ENSURE ENLIVE / ENSURE PLUS) liquid 237 mL  237 mL Oral BID BM [place] Bali, PA-C   237 mL at 06/30/23 1437   fentaNYL (SUBLIMAZE) injection 25-50 mcg  25-50 mcg Intravenous Q2H PRN [place] Bali, PA-C   50 mcg at 06/29/23 0818   gabapentin (NEURONTIN) capsule 300 mg  300 mg Oral Daily [place] Bali, PA-C   300 mg at 07/01/23 1610   And   gabapentin (NEURONTIN) capsule 600 mg  600 mg Oral QHS [place] Bali, PA-C   600 mg at 06/30/23 2108   guaiFENesin-dextromethorphan (ROBITUSSIN DM) 100-10 MG/5ML syrup 5 mL  5 mL Oral Q4H PRN Anthoney Harada, NP   5 mL at 06/30/23 2106   insulin aspart (novoLOG) injection  0-6 Units  0-6 Units Subcutaneous Q4H [place] Bali, PA-C   1 Units at 07/01/23 0759   insulin glargine-yfgn (SEMGLEE) injection 15 Units  15 Units Subcutaneous Daily Alekh, Kshitiz, MD       methocarbamol (ROBAXIN) tablet 500 mg  500 mg Oral Q6H PRN [place] Bali, PA-C   500 mg at 06/29/23 2113   metoCLOPramide (REGLAN) tablet 5-10 mg  5-10 mg Oral Q8H PRN [place] Bali, PA-C       Or   metoCLOPramide (REGLAN) injection 5-10 mg  5-10 mg Intravenous Q8H PRN West Bali, PA-C   10 mg at 06/29/23 2127   metoprolol tartrate (LOPRESSOR) tablet 100 mg  100 mg Oral BID West Bali, PA-C   100 mg at 07/01/23 0802   multivitamin with minerals tablet 1 tablet  1 tablet Oral Daily West Bali, PA-C   1 tablet at 07/01/23 [phone]  ondansetron (ZOFRAN) injection 4 mg  4 mg Intravenous Q6H PRN [place] Bali, PA-C   4 mg at 06/30/23 8295   oxyCODONE (Oxy IR/ROXICODONE) immediate release tablet 5 mg  5 mg Oral Q4H PRN [place] Bali, PA-C   5 mg at 06/30/23 2107   phenol (CHLORASEPTIC) mouth spray 1 spray  1 spray Mouth/Throat PRN Anthoney Harada, NP   1 spray at 06/29/23 2330   polyethylene glycol (MIRALAX / GLYCOLAX) packet 17 g  17 g Oral Daily PRN [place] Bali, PA-C       prochlorperazine (COMPAZINE) injection 10 mg  10 mg Intravenous Q6H PRN Anthoney Harada, NP       venlafaxine [facility]) tablet 50 mg  50 mg Oral BID WC McClung, Sarah A, PA-C   50 mg at 07/01/23 6213     Discharge Medications: Please see discharge summary for a list of discharge medications.  Relevant Imaging Results:  Relevant Lab Results:   Additional Information Soc Sec [id number]  Verna Czech [place], [place]

## 2023-07-01 NOTE — Progress Notes (Addendum)
Orthopaedic Trauma Progress Note  SUBJECTIVE: Doing well this morning.  Pain left leg well-controlled at rest.  States she had some pain when mobilizing to bedside chair with therapy yesterday.  Stayed up in the chair for about 2.5 hours.  Nausea has resolved.  Coughing less.  No chest pain. No SOB. No other complaints.  Patient's husband at bedside.  We discussed therapy recommendations for SNF.  Both patient and her husband are in agreement with this plan.  They would prefer [facility] rehab facility if available.  OBJECTIVE:  Vitals:   07/01/23 0440 07/01/23 0746  BP: (!) 120/52 (!) 118/47  Pulse:  73  Resp:  18  Temp:  98.1 F (36.7 C)  SpO2:  99%    General: Sitting up in bed, no acute distress Respiratory: No increased work of breathing.  Left lower extremity: Dressings removed, incisions are clean, dry, intact.  Mildly tender over the posterior lateral hip as expected.  Otherwise no significant tenderness with palpation throughout the thigh or lower leg.  No calf tenderness.  Compartment soft and compressible.  Ankle dorsiflexion/plantarflexion is intact. + EHL/FHL.  Toes warm and well-perfused.  Neurovascularly intact.  IMAGING: Stable post op imaging.   LABS:  Results for orders placed or performed during the hospital encounter of 06/28/23 (from the past 24 hour(s))  Glucose, capillary     Status: Abnormal   Collection Time: 06/30/23 12:06 PM  Result Value Ref Range   Glucose-Capillary 352 (H) 70 - 99 mg/dL  Glucose, capillary     Status: Abnormal   Collection Time: 06/30/23  4:18 PM  Result Value Ref Range   Glucose-Capillary 288 (H) 70 - 99 mg/dL  Glucose, capillary     Status: Abnormal   Collection Time: 06/30/23  7:48 PM  Result Value Ref Range   Glucose-Capillary 339 (H) 70 - 99 mg/dL   Comment 1 Notify RN   Glucose, capillary     Status: Abnormal   Collection Time: 07/01/23 12:02 AM  Result Value Ref Range   Glucose-Capillary 281 (H) 70 - 99 mg/dL   Comment 1 Notify  RN   CBC     Status: Abnormal   Collection Time: 07/01/23  3:15 AM  Result Value Ref Range   WBC 12.4 (H) 4.0 - 10.5 K/uL   RBC 2.61 (L) 3.87 - 5.11 MIL/uL   Hemoglobin 7.7 (L) 12.0 - 15.0 g/dL   HCT 78.2 (L) 95.6 - 21.3 %   MCV 91.2 80.0 - 100.0 fL   MCH 29.5 26.0 - 34.0 pg   MCHC 32.4 30.0 - 36.0 g/dL   RDW 08.6 57.8 - 46.9 %   Platelets 138 (L) 150 - 400 K/uL   nRBC 0.0 0.0 - 0.2 %  Basic metabolic panel     Status: Abnormal   Collection Time: 07/01/23  3:15 AM  Result Value Ref Range   Sodium 134 (L) 135 - 145 mmol/L   Potassium 3.7 3.5 - 5.1 mmol/L   Chloride 98 98 - 111 mmol/L   CO2 27 22 - 32 mmol/L   Glucose, Bld 249 (H) 70 - 99 mg/dL   BUN 39 (H) 8 - 23 mg/dL   Creatinine, Ser 6.29 (H) 0.44 - 1.00 mg/dL   Calcium 8.3 (L) 8.9 - 10.3 mg/dL   GFR, Estimated 29 (L) >60 mL/min   Anion gap 9 5 - 15  Magnesium     Status: None   Collection Time: 07/01/23  3:15 AM  Result Value Ref  Range   Magnesium 2.4 1.7 - 2.4 mg/dL  Glucose, capillary     Status: Abnormal   Collection Time: 07/01/23  4:13 AM  Result Value Ref Range   Glucose-Capillary 241 (H) 70 - 99 mg/dL   Comment 1 Notify RN   CBC     Status: Abnormal   Collection Time: 07/01/23  6:50 AM  Result Value Ref Range   WBC 12.3 (H) 4.0 - 10.5 K/uL   RBC 2.58 (L) 3.87 - 5.11 MIL/uL   Hemoglobin 7.8 (L) 12.0 - 15.0 g/dL   HCT 64.4 (L) 03.4 - 74.2 %   MCV 92.2 80.0 - 100.0 fL   MCH 30.2 26.0 - 34.0 pg   MCHC 32.8 30.0 - 36.0 g/dL   RDW 59.5 63.8 - 75.6 %   Platelets 135 (L) 150 - 400 K/uL   nRBC 0.0 0.0 - 0.2 %  Glucose, capillary     Status: Abnormal   Collection Time: 07/01/23  7:44 AM  Result Value Ref Range   Glucose-Capillary 189 (H) 70 - 99 mg/dL    ASSESSMENT: [name] is a 82 y.o. female, 2 Days Post-Op s/p INTRAMEDULLARY NAIL LEFT INTERTROCHANTERIC FEMUR FRACTURE   CV/Blood loss: Acute blood loss anemia, Hgb 7.8 this morning.  Significant drop over the last 24 hours.  Continue to monitor.   Hemodynamically stable  PLAN: Weightbearing: WBAT LLE ROM: Okay for unrestricted motion Incisional and dressing care: Dressings removed today.  Okay to leave incisions open to air Showering: Bed bath today. Okay to begin showering and getting incisions wet 07/02/2023 Orthopedic device(s): None  Pain management: Continue current multimodal regimen VTE prophylaxis: Home dose Aspirin 81 mg, SCDs ID:  Ancef 2gm post op completed Foley/Lines:  No foley, KVO IVFs Impediments to Fracture Healing: Diabetes (last hgb A1c 6.3 on 06/29/2023). Vitamin D level low normal at 36, will start on supplementation.   Dispo: PT/OT evaluation ongoing, currently recommending SNF.  Patient and her husband are in agreement with this.  Continue to monitor CBC.  Patient okay for discharge from ortho standpoint once cleared by medicine team and therapies.  Discharge Rx for pain medication and muscle relaxer have been placed in patient's chart  D/C recommendations: -Oxycodone 5 mg, Robaxin for pain control -Home dose aspirin 81 mg for DVT prophylaxis -Continue 1000 units Vit D3 supplementation daily x 90 days  Follow - up plan: 2 weeks after discharge for wound check and repeat x-rays   Contact information:  Truitt Merle MD, Thyra Breed PA-C. After hours and holidays please check [email or web address] for group call information for [facility]   Thompson Caul, PA-C [phone] (office) [email or web address]

## 2023-07-01 NOTE — Discharge Instructions (Signed)
Orthopaedic Trauma Service Discharge Instructions   General Discharge Instructions  WEIGHT BEARING STATUS: Weightbearing as tolerated left lower extremity  RANGE OF MOTION/ACTIVITY: Okay for unrestricted hip and knee range of motion  Wound Care: Incisions can be left open to air if there is no drainage. Once the incision is completely dry and without drainage, it may be left open to air out.  Showering may begin postoperative day #3, (Monday, 07/02/2023).  Clean incision gently with soap and water.  DVT/PE prophylaxis: Aspirin 81 mg daily  Diet: as you were eating previously.  Can use over the counter stool softeners and bowel preparations, such as Miralax, to help with bowel movements.  Narcotics can be constipating.  Be sure to drink plenty of fluids  PAIN MEDICATION USE AND EXPECTATIONS  You have likely been given narcotic medications to help control your pain.  After a traumatic event that results in an fracture (broken bone) with or without surgery, it is ok to use narcotic pain medications to help control one's pain.  We understand that everyone responds to pain differently and each individual patient will be evaluated on a regular basis for the continued need for narcotic medications. Ideally, narcotic medication use should last no more than 6-8 weeks (coinciding with fracture healing).   As a patient it is your responsibility as well to monitor narcotic medication use and report the amount and frequency you use these medications when you come to your office visit.   We would also advise that if you are using narcotic medications, you should take a dose prior to therapy to maximize you participation.  IF YOU ARE ON NARCOTIC MEDICATIONS IT IS NOT PERMISSIBLE TO OPERATE A MOTOR VEHICLE (MOTORCYCLE/CAR/TRUCK/MOPED) OR HEAVY MACHINERY DO NOT MIX NARCOTICS WITH OTHER CNS (CENTRAL NERVOUS SYSTEM) DEPRESSANTS SUCH AS ALCOHOL   STOP SMOKING OR USING NICOTINE PRODUCTS!!!!  As discussed  nicotine severely impairs your body's ability to heal surgical and traumatic wounds but also impairs bone healing.  Wounds and bone heal by forming microscopic blood vessels (angiogenesis) and nicotine is a vasoconstrictor (essentially, shrinks blood vessels).  Therefore, if vasoconstriction occurs to these microscopic blood vessels they essentially disappear and are unable to deliver necessary nutrients to the healing tissue.  This is one modifiable factor that you can do to dramatically increase your chances of healing your injury.    (This means no smoking, no nicotine gum, patches, etc)  DO NOT USE NONSTEROIDAL ANTI-INFLAMMATORY DRUGS (NSAID'S)  Using products such as Advil (ibuprofen), Aleve (naproxen), Motrin (ibuprofen) for additional pain control during fracture healing can delay and/or prevent the healing response.  If you would like to take over the counter (OTC) medication, Tylenol (acetaminophen) is ok.  However, some narcotic medications that are given for pain control contain acetaminophen as well. Therefore, you should not exceed more than 4000 mg of tylenol in a day if you do not have liver disease.  Also note that there are may OTC medicines, such as cold medicines and allergy medicines that my contain tylenol as well.  If you have any questions about medications and/or interactions please ask your doctor/PA or your pharmacist.      ICE AND ELEVATE INJURED/OPERATIVE EXTREMITY  Using ice and elevating the injured extremity above your heart can help with swelling and pain control.  Icing in a pulsatile fashion, such as 20 minutes on and 20 minutes off, can be followed.    Do not place ice directly on skin. Make sure there is a  barrier between to skin and the ice pack.    Using frozen items such as frozen peas works well as the conform nicely to the are that needs to be iced.  USE AN ACE WRAP OR TED HOSE FOR SWELLING CONTROL  In addition to icing and elevation, Ace wraps or TED hose are  used to help limit and resolve swelling.  It is recommended to use Ace wraps or TED hose until you are informed to stop.    When using Ace Wraps start the wrapping distally (farthest away from the body) and wrap proximally (closer to the body)   Example: If you had surgery on your leg or thing and you do not have a splint on, start the ace wrap at the toes and work your way up to the thigh        If you had surgery on your upper extremity and do not have a splint on, start the ace wrap at your fingers and work your way up to the upper arm  CALL THE OFFICE WITH ANY QUESTIONS OR CONCERNS: [phone]   VISIT OUR WEBSITE FOR ADDITIONAL INFORMATION: [email or web address]   Discharge Wound Care Instructions  Do NOT apply any ointments, solutions or lotions to pin sites or surgical wounds.  These prevent needed drainage and even though solutions like hydrogen peroxide kill bacteria, they also damage cells lining the pin sites that help fight infection.  Applying lotions or ointments can keep the wounds moist and can cause them to breakdown and open up as well. This can increase the risk for infection. When in doubt call the office.  If any drainage is noted, use foam dressing - These dressing supplies should be available at local medical supply stores [facility], [facility], etc) as well as Insurance claims handler ([facility], [facility], [employer], etc)  Once the incision is completely dry and without drainage, it may be left open to air out.  Showering may begin 36-48 hours later.  Cleaning gently with soap and water.

## 2023-07-01 NOTE — Plan of Care (Signed)
  Problem: Health Behavior/Discharge Planning: Goal: Ability to identify and utilize available resources and services will improve Outcome: Progressing   Problem: Health Behavior/Discharge Planning: Goal: Ability to manage health-related needs will improve Outcome: Progressing   Problem: Metabolic: Goal: Ability to maintain appropriate glucose levels will improve Outcome: Progressing

## 2023-07-01 NOTE — Plan of Care (Signed)
  Problem: Activity: Goal: Risk for activity intolerance will decrease 07/01/2023 1013 by Olga Millers, LPN Outcome: Progressing 07/01/2023 1012 by Olga Millers, LPN Outcome: Progressing   Problem: Pain Management: Goal: General experience of comfort will improve Outcome: Progressing   Problem: Elimination: Goal: Will not experience complications related to bowel motility Outcome: Progressing   Problem: Elimination: Goal: Will not experience complications related to urinary retention Outcome: Progressing   Problem: Coping: Goal: Level of anxiety will decrease 07/01/2023 1013 by Olga Millers, LPN Outcome: Progressing 07/01/2023 1012 by Olga Millers, LPN Outcome: Progressing   Problem: Nutrition: Goal: Adequate nutrition will be maintained 07/01/2023 1013 by Olga Millers, LPN Outcome: Progressing 07/01/2023 1012 by Olga Millers, LPN Outcome: Progressing

## 2023-07-01 NOTE — TOC Initial Note (Signed)
Transition of Care [name]) - Initial/Assessment Note    Patient Details  Name: [name] MRN: [id number] Date of Birth: 1941-08-10  Transition of Care [name]) CM/SW Contact:    [name], [place] Phone Number: 07/01/2023, 9:52 AM  Clinical Narrative:                 This Child psychotherapist met with patient and spouse at bedside to discuss SNF recommendation. Patient and spouse agreeable to SNF stay and would like [facility] at their first choice. Patient resides home with her spouse, who assists with her care. Patient has a cain, walker, rails around the commode and shower for safety. Patient receives no additional services in the home and is followed by Dr. Orson Aloe and [facility].  Fl2 faxed out , bed offers to be provided to patient and her spouse once received.  Nancy Halvorson, LCSW Transition of Care    Expected Discharge Plan: Skilled Nursing Facility Barriers to Discharge: Continued Medical Work up   Patient Goals and CMS Choice Patient states their goals for this hospitalization and ongoing recovery are:: To get stronger CMS Medicare.gov Compare Post Acute Care list provided to:: Patient Represenative (must comment) Choice offered to / list presented to : Spouse      Expected Discharge Plan and Services In-house Referral: Clinical Social Work     Living arrangements for the past 2 months: Single Family Home                                      Prior Living Arrangements/Services Living arrangements for the past 2 months: Single Family Home Lives with:: Spouse   Do you feel safe going back to the place where you live?: Yes      Need for Family Participation in Patient Care: Yes (Comment) Care giver support system in place?: Yes (comment) Current home services: DME (walker, rails around the commode and shower, walk in shower, cain) Criminal Activity/Legal Involvement Pertinent to Current Situation/Hospitalization: No - Comment  as needed  Activities of Daily Living   ADL Screening (condition at time of admission) Independently performs ADLs?: Yes (appropriate for developmental age) Is the patient deaf or have difficulty hearing?: No Does the patient have difficulty seeing, even when wearing glasses/contacts?: No Does the patient have difficulty concentrating, remembering, or making decisions?: No  Permission Sought/Granted   Permission granted to share information with : Yes, Verbal Permission Granted  Share Information with NAME: [name]     Permission granted to share info w Relationship: spouse  Permission granted to share info w Contact Information: [phone]  Emotional Assessment Appearance:: Appears stated age Attitude/Demeanor/Rapport: Other (comment) Affect (typically observed): Quiet Orientation: : Oriented to Self, Oriented to Place, Oriented to  Time, Oriented to Situation Alcohol / Substance Use: Not Applicable Psych Involvement: No (comment)  Admission diagnosis:  Closed left hip fracture, initial encounter (HCC) [S72.002A] Closed fracture of left femur, unspecified fracture morphology, unspecified portion of femur, initial encounter [facility]) [S72.92XA] Patient Active Problem List   Diagnosis Date Noted   Closed left hip fracture, initial encounter (HCC) 06/28/2023   Fatigue 02/21/2022   Hearing difficulty 09/22/2021   Pain in right hand 06/15/2021   Arthritis of hand 04/14/2021   Physical deconditioning 12/06/2020   Hypertensive crisis 11/19/2020   Abnormality of left atrial appendage 09/06/2020   Anxiety 09/06/2020   Arteriovenous malformation of digestive system vessel (  CODE) 09/06/2020   CAD (coronary artery disease) 09/06/2020   Chronic kidney disease, stage 3 unspecified (HCC) 09/06/2020   (HFpEF) heart failure with preserved ejection fraction (HCC) 09/06/2020   Diabetic retinopathy associated with type 2 diabetes mellitus (HCC) 09/06/2020   Gastroesophageal reflux  disease with esophagitis 09/06/2020   Hypomagnesemia 09/06/2020   Infectious colitis, enteritis and gastroenteritis 09/06/2020   Insomnia 09/06/2020   Long term (current) use of insulin (HCC) 09/06/2020   Other specified disorders of bone density and structure, other site 09/06/2020   Peripheral venous insufficiency 09/06/2020   History of colonic polyps 09/06/2020   Personal history of other diseases of the digestive system 09/06/2020   Presence of Watchman left atrial appendage closure device 09/06/2020   Pulmonary nodule 09/06/2020   Recurrent falls 09/06/2020   Recurrent major depression (HCC) 09/06/2020   Slow transit constipation 09/06/2020   Upper abdominal pain 09/06/2020   Secondary hypercoagulable state (HCC) 08/05/2020   Acute on chronic respiratory failure with hypoxemia (HCC) 07/22/2020   Pleural effusion 07/22/2020   SOB (shortness of breath) 07/12/2020   ABLA (acute blood loss anemia) 07/12/2020   Paroxysmal atrial fibrillation (HCC) 04/14/2020   Thoracic back pain 12/16/2019   Muscle weakness 05/01/2018   Impairment of balance 04/25/2018   S/P left TKA 11/05/2017   S/P total knee replacement 11/05/2017   Osteoarthritis 09/06/2017   Pain in left knee 09/06/2017   Essential hypertension 09/06/2017   Diabetes mellitus (HCC) 12/25/2016   Hypercholesterolemia 12/25/2016   Ptosis of eyelid 10/09/2016   Iron deficiency anemia 12/15/2015   Acute GI bleeding 05/13/2015   Acute blood loss anemia 05/13/2015   Normocytic anemia 05/13/2015   Allergic rhinitis 04/06/2015   Asthma, chronic 09/08/2013   Hypoxia 09/08/2013   HTN (hypertension) 09/08/2013   Depression 09/08/2013   DM type 2 with diabetic peripheral neuropathy (HCC) 09/08/2013   Polyneuropathy due to type 2 diabetes mellitus (HCC) 09/08/2013   Flu-like symptoms 09/07/2013   PCP:  Nancy Aspen, MD Pharmacy:   [phone] - [place] - [address] [place]  [address] Phone: [phone] Fax: [phone]  [facility] - [name], [place] - [address] [place] [address] Phone: [phone] Fax: [phone]     Social Determinants of Health (SDOH) Social History: SDOH Screenings   Food Insecurity: No Food Insecurity (06/28/2023)  Housing: Low Risk  (06/28/2023)  Transportation Needs: No Transportation Needs (06/28/2023)  Utilities: Not At Risk (06/28/2023)  Financial Resource Strain: Low Risk  (05/11/2022)  Social Connections: Socially Integrated (05/11/2022)  Stress: No Stress Concern Present (05/11/2022)  Tobacco Use: Low Risk  (06/29/2023)   SDOH Interventions:     Readmission Risk Interventions     No data to display

## 2023-07-02 DIAGNOSIS — S72002A Fracture of unspecified part of neck of left femur, initial encounter for closed fracture: Secondary | ICD-10-CM | POA: Diagnosis not present

## 2023-07-02 LAB — PREPARE RBC (CROSSMATCH)

## 2023-07-02 LAB — BASIC METABOLIC PANEL
Anion gap: 6 (ref 5–15)
BUN: 32 mg/dL — ABNORMAL HIGH (ref 8–23)
CO2: 28 mmol/L (ref 22–32)
Calcium: 8.3 mg/dL — ABNORMAL LOW (ref 8.9–10.3)
Chloride: 103 mmol/L (ref 98–111)
Creatinine, Ser: 1.23 mg/dL — ABNORMAL HIGH (ref 0.44–1.00)
GFR, Estimated: 44 mL/min — ABNORMAL LOW (ref >60)
Glucose, Bld: 182 mg/dL — ABNORMAL HIGH (ref 70–99)
Potassium: 4.2 mmol/L (ref 3.5–5.1)
Sodium: 137 mmol/L (ref 135–145)

## 2023-07-02 LAB — CBC
HCT: 22.2 % — ABNORMAL LOW (ref 36.0–46.0)
Hemoglobin: 7 g/dL — ABNORMAL LOW (ref 12.0–15.0)
MCH: 29.4 pg (ref 26.0–34.0)
MCHC: 31.5 g/dL (ref 30.0–36.0)
MCV: 93.3 fL (ref 80.0–100.0)
Platelets: 148 10*3/uL — ABNORMAL LOW (ref 150–400)
RBC: 2.38 MIL/uL — ABNORMAL LOW (ref 3.87–5.11)
RDW: 14 % (ref 11.5–15.5)
WBC: 10.4 10*3/uL (ref 4.0–10.5)
nRBC: 0 % (ref 0.0–0.2)

## 2023-07-02 LAB — GLUCOSE, CAPILLARY
Glucose-Capillary: 162 mg/dL — ABNORMAL HIGH (ref 70–99)
Glucose-Capillary: 180 mg/dL — ABNORMAL HIGH (ref 70–99)
Glucose-Capillary: 228 mg/dL — ABNORMAL HIGH (ref 70–99)
Glucose-Capillary: 235 mg/dL — ABNORMAL HIGH (ref 70–99)
Glucose-Capillary: 255 mg/dL — ABNORMAL HIGH (ref 70–99)
Glucose-Capillary: 258 mg/dL — ABNORMAL HIGH (ref 70–99)
Glucose-Capillary: 295 mg/dL — ABNORMAL HIGH (ref 70–99)

## 2023-07-02 LAB — MAGNESIUM: Magnesium: 2.6 mg/dL — ABNORMAL HIGH (ref 1.7–2.4)

## 2023-07-02 MED ORDER — SODIUM CHLORIDE 0.9% IV SOLUTION: Freq: Once | INTRAVENOUS | Status: AC

## 2023-07-02 MED ORDER — IPRATROPIUM-ALBUTEROL 0.5-2.5 (3) MG/3ML IN SOLN: 3.0000 mL | Freq: Four times a day (QID) | RESPIRATORY_TRACT | Status: DC | PRN

## 2023-07-02 NOTE — Care Management Important Message (Signed)
Important Message  Patient Details  Name: [name] MRN: [id number] Date of Birth: April 06, 1941   Important Message Given:  Yes - Medicare IM     Sherilyn Banker 07/02/2023, 1:27 PM

## 2023-07-02 NOTE — TOC Progression Note (Addendum)
Transition of Care [facility]) - Progression Note    Patient Details  Name: [name] MRN: [id number] Date of Birth: [birth year]  Transition of Care [facility]) CM/SW Contact  Lorri Frederick, LCSW Phone Number: 07/02/2023, 10:37 AM  Clinical Narrative:   CSW informed pt and husband that [facility] does offer bed.  They do want to accept this offer.    1000: [facility] confirms they can receive pt.  Per MD, possible DC tomorrow.  1045: SNF auth request submitted in [employer] and approved: [id number], 3 days: 11/5-11/7.  Expected Discharge Plan: Skilled Nursing Facility Barriers to Discharge: Continued Medical Work up  Expected Discharge Plan and Services In-house Referral: Clinical Social Work     Living arrangements for the past 2 months: Single Family Home                                       Social Determinants of Health (SDOH) Interventions SDOH Screenings   Food Insecurity: No Food Insecurity (06/28/2023)  Housing: Low Risk  (06/28/2023)  Transportation Needs: No Transportation Needs (06/28/2023)  Utilities: Not At Risk (06/28/2023)  Financial Resource Strain: Low Risk  (05/11/2022)  Social Connections: Socially Integrated (05/11/2022)  Stress: No Stress Concern Present (05/11/2022)  Tobacco Use: Low Risk  (06/29/2023)    Readmission Risk Interventions     No data to display

## 2023-07-02 NOTE — Progress Notes (Signed)
RE:   [name]     Date of Birth:  10/16/1940     Date:   07/02/23       To Whom It May Concern:  Please be advised that the above-named patient will require a short-term nursing home stay - anticipated 30 days or less for rehabilitation and strengthening.  The plan is for return home.                 MD signature                Date

## 2023-07-02 NOTE — Progress Notes (Signed)
PROGRESS NOTE    [name]  JWJ:[id number] DOB: 07-15-1941 DOA: 06/28/2023 PCP: Emilio Aspen, MD   Brief Narrative:  82 y.o. female with medical history significant for hypertension, type 2 diabetes mellitus, hyperlipidemia, chronic HFpEF, depression, and PAF not anticoagulated due to GI bleeding and s/p Watchman procedure presented with left leg pain after fall at home and was found to have left intertrochanteric femur fracture.  She underwent surgical intervention on 06/29/2023.  Assessment & Plan:   Left intertrochanteric femur fracture after a fall -underwent surgical intervention on 06/29/2023. Pain management/wound care/DVT prophylaxis/activity as per orthopedics recommendations.  Fall precautions.  -PT/OT recommend SNF placement.  TOC following.  Paroxysmal A-fib - Could not tolerate anticoagulation d/t GI bleeding and is s/p Wathcman procedure  - Continue metoprolol.  Currently rate controlled  AKI on CKD stage IIIa -Creatinine improving to 1.76 on 07/01/2023.  Labs pending today.  Encourage oral intake.  DC IV fluids.  Monitor.  Acute blood loss anemia -Possibly from femur fracture and perioperative.  Hemoglobin 7.8 on 07/01/2023.  Labs pending today.  Monitor.  Transfuse if hemoglobin is less than 7  Thrombocytopenia -Questionable cause.  Monitor intermittently.  Hyponatremia -Mild.  Monitor.  Leukocytosis -Possibly reactive.  Labs pending today  Hypertension -Continue metoprolol.  Monitor blood pressure  Diabetes mellitus type 2 with hyperglycemia -Continue CBGs with SSI.  Continue long-acting insulin.  Carb modified diet  Chronic diastolic heart failure -Currently compensated.  Strict input and output.  Daily weights.  Continue metoprolol.  Lasix on hold  Depression -Continue Effexor   DVT prophylaxis: SCDs Code Status: Full Family Communication: Husband at bedside  Disposition Plan: Status is: Inpatient Remains inpatient appropriate  because: Of severity of illness    Consultants: Orthopedics  Procedures: As above  Antimicrobials: Perioperative  Subjective: Patient seen and examined at bedside.  Has intermittent left hip pain.  No chest pain, shortness of breath, fever or vomiting reported.   Objective: Vitals:   07/01/23 2200 07/01/23 2328 07/02/23 0500 07/02/23 0716  BP:   (!) 113/46 (!) 111/44  Pulse:   99 71  Resp:   18 17  Temp: 99.5 F (37.5 C) 99.2 F (37.3 C) 98.7 F (37.1 C) 98.5 F (36.9 C)  TempSrc: Oral Oral Oral Oral  SpO2:   100% 99%  Weight:      Height:        Intake/Output Summary (Last 24 hours) at 07/02/2023 0738 Last data filed at 07/01/2023 1825 Gross per 24 hour  Intake 1820.2 ml  Output 900 ml  Net 920.2 ml   Filed Weights   06/28/23 2037  Weight: 60.8 kg    Examination:  General: On 2 L oxygen via nasal cannula.  No distress.  Chronically ill and deconditioned looking..   ENT/neck: No obvious thyromegaly or JVD elevation noted  respiratory: Bilateral decreased breath sounds at bases with scattered crackles CVS: S1-S2 heard; rate currently controlled abdominal: Soft, nontender, slightly distended; no organomegaly, normal bowel sounds heard  extremities: Mild lower extremity edema present; no cyanosis  CNS: Awake and alert.  No focal neurologic deficit noted.   Lymph: No palpable lymphadenopathy Skin: No obvious ecchymosis/rashes psych: Flat affect.  Not agitated.   Data Reviewed: I have personally reviewed following labs and imaging studies  CBC: Recent Labs  Lab 06/28/23 2034 06/29/23 0735 06/30/23 0309 07/01/23 0315 07/01/23 0650  WBC 14.4* 12.6* 15.5* 12.4* 12.3*  NEUTROABS 11.8*  --   --   --   --  HGB 14.8 14.2 11.7* 7.7* 7.8*  HCT 46.7* 43.7 36.7 23.8* 23.8*  MCV 91.6 91.6 94.8 91.2 92.2  PLT 253 257 218 138* 135*   Basic Metabolic Panel: Recent Labs  Lab 06/28/23 2034 06/29/23 0735 06/30/23 0309 07/01/23 0315  NA 138 139 135 134*  K 3.5  4.6 4.6 3.7  CL 97* 98 98 98  CO2 27 32 20* 27  GLUCOSE 107* 182* 264* 249*  BUN 19 19 30* 39*  CREATININE 1.09* 1.15* 1.94* 1.76*  CALCIUM 9.2 9.1 8.6* 8.3*  MG  --   --  2.1 2.4   GFR: Estimated Creatinine Clearance: 21.3 mL/min (A) (by C-G formula based on SCr of 1.76 mg/dL (H)). Liver Function Tests: No results for input(s): "AST", "ALT", "ALKPHOS", "BILITOT", "PROT", "ALBUMIN" in the last 168 hours. No results for input(s): "LIPASE", "AMYLASE" in the last 168 hours. No results for input(s): "AMMONIA" in the last 168 hours. Coagulation Profile: Recent Labs  Lab 06/28/23 2034  INR 1.1   Cardiac Enzymes: No results for input(s): "CKTOTAL", "CKMB", "CKMBINDEX", "TROPONINI" in the last 168 hours. BNP (last 3 results) Recent Labs    06/05/23 1523  PROBNP 1,103*   HbA1C: No results for input(s): "HGBA1C" in the last 72 hours.  CBG: Recent Labs  Lab 07/01/23 1736 07/01/23 2035 07/02/23 0003 07/02/23 0424 07/02/23 0714  GLUCAP 187* 282* 258* 180* 162*   Lipid Profile: No results for input(s): "CHOL", "HDL", "LDLCALC", "TRIG", "CHOLHDL", "LDLDIRECT" in the last 72 hours. Thyroid Function Tests: No results for input(s): "TSH", "T4TOTAL", "FREET4", "T3FREE", "THYROIDAB" in the last 72 hours. Anemia Panel: No results for input(s): "VITAMINB12", "FOLATE", "FERRITIN", "TIBC", "IRON", "RETICCTPCT" in the last 72 hours. Sepsis Labs: No results for input(s): "PROCALCITON", "LATICACIDVEN" in the last 168 hours.  Recent Results (from the past 240 hour(s))  MRSA Next Gen by PCR, Nasal     Status: None   Collection Time: 06/29/23 12:39 PM   Specimen: Nasal Mucosa; Nasal Swab  Result Value Ref Range Status   MRSA by PCR Next Gen NOT DETECTED NOT DETECTED Final    Comment: (NOTE) The GeneXpert MRSA Assay (FDA approved for NASAL specimens only), is one component of a comprehensive MRSA colonization surveillance program. It is not intended to diagnose MRSA infection nor to  guide or monitor treatment for MRSA infections. Test performance is not FDA approved in patients less than 24 years old. Performed at [facility], [address], [place], [place] [address]          Radiology Studies: No results found.      Scheduled Meds:  aspirin EC  81 mg Oral Daily   cholecalciferol  1,000 Units Oral Daily   docusate sodium  100 mg Oral BID   famotidine  40 mg Oral Daily   feeding supplement  237 mL Oral BID BM   gabapentin  300 mg Oral Daily   And   gabapentin  600 mg Oral QHS   insulin aspart  0-6 Units Subcutaneous Q4H   insulin glargine-yfgn  15 Units Subcutaneous Daily   metoprolol tartrate  100 mg Oral BID   multivitamin with minerals  1 tablet Oral Daily   venlafaxine  50 mg Oral BID WC   Continuous Infusions:  sodium chloride 50 mL/hr at 07/01/23 1825          Glade Lloyd, MD [facility] 07/02/2023, 7:38 AM

## 2023-07-03 DIAGNOSIS — Z7401 Bed confinement status: Secondary | ICD-10-CM | POA: Diagnosis not present

## 2023-07-03 DIAGNOSIS — S72142A Displaced intertrochanteric fracture of left femur, initial encounter for closed fracture: Secondary | ICD-10-CM | POA: Diagnosis not present

## 2023-07-03 DIAGNOSIS — E46 Unspecified protein-calorie malnutrition: Secondary | ICD-10-CM | POA: Diagnosis not present

## 2023-07-03 DIAGNOSIS — L89302 Pressure ulcer of unspecified buttock, stage 2: Secondary | ICD-10-CM | POA: Diagnosis not present

## 2023-07-03 DIAGNOSIS — Z9889 Other specified postprocedural states: Secondary | ICD-10-CM | POA: Diagnosis not present

## 2023-07-03 DIAGNOSIS — S72002A Fracture of unspecified part of neck of left femur, initial encounter for closed fracture: Secondary | ICD-10-CM | POA: Diagnosis not present

## 2023-07-03 DIAGNOSIS — S7292XD Unspecified fracture of left femur, subsequent encounter for closed fracture with routine healing: Secondary | ICD-10-CM | POA: Diagnosis not present

## 2023-07-03 DIAGNOSIS — N1831 Chronic kidney disease, stage 3a: Secondary | ICD-10-CM | POA: Diagnosis not present

## 2023-07-03 DIAGNOSIS — E1151 Type 2 diabetes mellitus with diabetic peripheral angiopathy without gangrene: Secondary | ICD-10-CM | POA: Diagnosis not present

## 2023-07-03 DIAGNOSIS — I480 Paroxysmal atrial fibrillation: Secondary | ICD-10-CM | POA: Diagnosis not present

## 2023-07-03 DIAGNOSIS — K5900 Constipation, unspecified: Secondary | ICD-10-CM | POA: Diagnosis not present

## 2023-07-03 DIAGNOSIS — R262 Difficulty in walking, not elsewhere classified: Secondary | ICD-10-CM | POA: Diagnosis not present

## 2023-07-03 LAB — CBC WITH DIFFERENTIAL/PLATELET
Abs Immature Granulocytes: 0.05 10*3/uL (ref 0.00–0.07)
Basophils Absolute: 0.1 10*3/uL (ref 0.0–0.1)
Basophils Relative: 1 %
Eosinophils Absolute: 0.3 10*3/uL (ref 0.0–0.5)
Eosinophils Relative: 4 %
HCT: 27.9 % — ABNORMAL LOW (ref 36.0–46.0)
Hemoglobin: 9 g/dL — ABNORMAL LOW (ref 12.0–15.0)
Immature Granulocytes: 1 %
Lymphocytes Relative: 15 %
Lymphs Abs: 1.3 10*3/uL (ref 0.7–4.0)
MCH: 29.3 pg (ref 26.0–34.0)
MCHC: 32.3 g/dL (ref 30.0–36.0)
MCV: 90.9 fL (ref 80.0–100.0)
Monocytes Absolute: 0.9 10*3/uL (ref 0.1–1.0)
Monocytes Relative: 11 %
Neutro Abs: 5.8 10*3/uL (ref 1.7–7.7)
Neutrophils Relative %: 68 %
Platelets: 187 10*3/uL (ref 150–400)
RBC: 3.07 MIL/uL — ABNORMAL LOW (ref 3.87–5.11)
RDW: 15.6 % — ABNORMAL HIGH (ref 11.5–15.5)
WBC: 8.5 10*3/uL (ref 4.0–10.5)
nRBC: 0 % (ref 0.0–0.2)

## 2023-07-03 LAB — COMPREHENSIVE METABOLIC PANEL
ALT: 7 U/L (ref 0–44)
AST: 15 U/L (ref 15–41)
Albumin: 2.2 g/dL — ABNORMAL LOW (ref 3.5–5.0)
Alkaline Phosphatase: 55 U/L (ref 38–126)
Anion gap: 7 (ref 5–15)
BUN: 23 mg/dL (ref 8–23)
CO2: 29 mmol/L (ref 22–32)
Calcium: 8.6 mg/dL — ABNORMAL LOW (ref 8.9–10.3)
Chloride: 104 mmol/L (ref 98–111)
Creatinine, Ser: 0.81 mg/dL (ref 0.44–1.00)
GFR, Estimated: >60 mL/min (ref >60)
Glucose, Bld: 177 mg/dL — ABNORMAL HIGH (ref 70–99)
Potassium: 4.6 mmol/L (ref 3.5–5.1)
Sodium: 140 mmol/L (ref 135–145)
Total Bilirubin: 0.6 mg/dL (ref <1.2)
Total Protein: 4.9 g/dL — ABNORMAL LOW (ref 6.5–8.1)

## 2023-07-03 LAB — TYPE AND SCREEN
ABO/RH(D): A POS
Antibody Screen: NEGATIVE
Unit division: 0

## 2023-07-03 LAB — GLUCOSE, CAPILLARY
Glucose-Capillary: 178 mg/dL — ABNORMAL HIGH (ref 70–99)
Glucose-Capillary: 272 mg/dL — ABNORMAL HIGH (ref 70–99)
Glucose-Capillary: 196 mg/dL — ABNORMAL HIGH (ref 70–99)
Glucose-Capillary: 159 mg/dL — ABNORMAL HIGH (ref 70–99)
Glucose-Capillary: 203 mg/dL — ABNORMAL HIGH (ref 70–99)

## 2023-07-03 LAB — MAGNESIUM: Magnesium: 2.1 mg/dL (ref 1.7–2.4)

## 2023-07-03 LAB — BPAM RBC
Blood Product Expiration Date: 202411232359
ISSUE DATE / TIME: 202411041201
Unit Type and Rh: 6200

## 2023-07-03 MED ORDER — VITAMIN D3 25 MCG PO TABS: 1000.0000 [IU] | ORAL_TABLET | Freq: Every day | ORAL | 0 refills | Status: AC

## 2023-07-03 MED ORDER — GABAPENTIN 300 MG PO CAPS: 300.0000 mg | ORAL_CAPSULE | Freq: Three times a day (TID) | ORAL | 0 refills | Status: DC

## 2023-07-03 MED ORDER — DOCUSATE SODIUM 100 MG PO CAPS: 100.0000 mg | ORAL_CAPSULE | Freq: Two times a day (BID) | ORAL | Status: AC

## 2023-07-03 MED ORDER — POLYETHYLENE GLYCOL 3350 17 G PO PACK: 17.0000 g | PACK | Freq: Every day | ORAL | 0 refills | Status: AC | PRN

## 2023-07-03 NOTE — TOC Transition Note (Signed)
Transition of Care [facility]) - CM/SW Discharge Note   Patient Details  Name: [name] MRN: [id number] Date of Birth: Nov 04, 1940  Transition of Care [facility]) CM/SW Contact:  Lorri Frederick, LCSW Phone Number: 07/03/2023, 11:55 AM   Clinical Narrative:   Pt discharging to [facility].  RN call report to [phone].       Final next level of care: Skilled Nursing Facility Barriers to Discharge: Barriers Resolved   Patient Goals and CMS Choice CMS Medicare.gov Compare Post Acute Care list provided to:: Patient Represenative (must comment) Choice offered to / list presented to : Spouse  Discharge Placement                Patient chooses bed at: [facility] Patient to be transferred to facility by: ptar Name of family member notified: husband [name] in room Patient and family notified of of transfer: 07/03/23  Discharge Plan and Services Additional resources added to the After Visit Summary for   In-house Referral: Clinical Social Work                                   Social Determinants of Health (SDOH) Interventions SDOH Screenings   Food Insecurity: No Food Insecurity (06/28/2023)  Housing: Low Risk  (06/28/2023)  Transportation Needs: No Transportation Needs (06/28/2023)  Utilities: Not At Risk (06/28/2023)  Financial Resource Strain: Low Risk  (05/11/2022)  Social Connections: Socially Integrated (05/11/2022)  Stress: No Stress Concern Present (05/11/2022)  Tobacco Use: Low Risk  (06/29/2023)     Readmission Risk Interventions     No data to display

## 2023-07-03 NOTE — Discharge Summary (Signed)
Physician Discharge Summary  [name]:[id number] DOB: 10/17/1940 DOA: 06/28/2023  PCP: Emilio Aspen, MD  Admit date: 06/28/2023 Discharge date: 07/03/2023  Admitted From: Home Disposition: SNF  Recommendations for Outpatient Follow-up:  Follow up with SNF provider at earliest convenience Outpatient follow-up with orthopedics.  Discharge pain medication/activity/DVT prophylaxis/wound care as per orthopedics recommendations  follow up in ED if symptoms worsen or new appear   Home Health: No Equipment/Devices: None  Discharge Condition: Stable CODE STATUS: Full Diet recommendation: Heart healthy/carb modified  Brief/Interim Summary: 82 y.o. female with medical history significant for hypertension, type 2 diabetes mellitus, hyperlipidemia, chronic HFpEF, depression, and PAF not anticoagulated due to GI bleeding and s/p Watchman procedure presented with left leg pain after fall at home and was found to have left intertrochanteric femur fracture.  She underwent surgical intervention on 06/29/2023.  Subsequently, PT recommended SNF placement.  She is currently medically stable for discharge.  She will be discharged to SNF once bed is available.    Discharge Diagnoses:   Left intertrochanteric femur fracture after a fall -underwent surgical intervention on 06/29/2023. Pain management/wound care/DVT prophylaxis/activity as per orthopedics recommendations.  Outpatient follow-up with orthopedics. -PT/OT recommend SNF placement.  She is currently medically stable for discharge.  She will be discharged to SNF once bed is available.   Paroxysmal A-fib - Could not tolerate anticoagulation d/t GI bleeding and is s/p Wathcman procedure  - Continue metoprolol.  Currently rate controlled   AKI on CKD stage IIIa -Treated with IV fluids and creatinine has improved.  Creatinine 0.81 today.  Outpatient follow-up.  Will keep Lasix and losartan on hold for now.   Acute blood loss  anemia -Possibly from femur fracture and perioperative.  Received 1 unit packed red cell transfusion on 07/02/2023 for hemoglobin of 7.  Hemoglobin 9 today.  Outpatient follow-up.   Thrombocytopenia -Questionable cause.  Resolved   Hyponatremia -Improved   Leukocytosis -Resolved   Hypertension -Continue metoprolol.  Lasix and losartan to remain on hold till reevaluation as an outpatient.   Diabetes mellitus type 2 with hyperglycemia -Continue carb modified diet.  Continue long-acting insulin and sliding scale insulin   Chronic diastolic heart failure -Currently compensated.  Continue diet and fluid restriction.  Continue metoprolol.  Lasix and losartan on hold.  Outpatient follow-up with cardiology.   Depression -Continue Effexor  Discharge Instructions  Discharge Instructions     Diet - low sodium heart healthy   Complete by: As directed    Diet Carb Modified   Complete by: As directed    Increase activity slowly   Complete by: As directed       Allergies as of 07/03/2023       Reactions   Vasotec Shortness Of Breath, Other (See Comments)   Wheezing, also   Atorvastatin Other (See Comments)   Leg cramps   Codeine Nausea And Vomiting   Cymbalta [duloxetine Hcl] Itching   Lansoprazole Itching, Swelling, Rash, Other (See Comments)   Redness, swelling of mouth. During 07/12/20-07/15/20 pt tolerated IV protonix infusion with no reactions.   Morphine And Codeine Itching   Sulfate Itching   Amitriptyline Hcl Other (See Comments)    sleepwalking   Escitalopram Oxalate Itching   Hydrocodone Itching   Iron Other (See Comments)   constipated   Sertraline Hcl Itching   Tramadol Itching   Adhesive [tape] Rash   Allevyn Adhesive [wound Dressings] Rash   Scopolamine Rash        Medication List  STOP taking these medications    ALPRAZolam 0.25 MG tablet Commonly known as: XANAX   furosemide 40 MG tablet Commonly known as: LASIX   Klor-Con M20 20 MEQ  tablet Generic drug: potassium chloride SA   losartan 50 MG tablet Commonly known as: COZAAR       TAKE these medications    acetaminophen 500 MG tablet Commonly known as: TYLENOL Take 1,000 mg by mouth every 8 (eight) hours as needed for mild pain (pain score 1-3) or headache.   aspirin EC 81 MG tablet Take 1 tablet (81 mg total) by mouth daily. Swallow whole.   docusate sodium 100 MG capsule Commonly known as: COLACE Take 1 capsule (100 mg total) by mouth 2 (two) times daily. What changed:  when to take this reasons to take this   Droplet Pen Needles 32G X 4 MM Misc Generic drug: Insulin Pen Needle   famotidine 40 MG tablet Commonly known as: PEPCID Take 40 mg by mouth daily as needed.   FreeStyle Libre 14 Day Sensor Misc Inject 1 patch into the skin every 14 (fourteen) days.   gabapentin 300 MG capsule Commonly known as: NEURONTIN Take 1 capsule (300 mg total) by mouth 3 (three) times daily.   insulin glargine 100 UNIT/ML Solostar Pen Commonly known as: LANTUS Inject 19 Units into the skin at bedtime.   ipratropium 0.06 % nasal spray Commonly known as: ATROVENT Place 1 spray into both nostrils daily.   Jardiance 10 MG Tabs tablet Generic drug: empagliflozin Take by mouth.   Livalo 1 MG Tabs Generic drug: Pitavastatin Calcium Take 2 mg by mouth at bedtime.   methocarbamol 500 MG tablet Commonly known as: ROBAXIN Take 1 tablet (500 mg total) by mouth every 8 (eight) hours as needed for muscle spasms.   metoprolol tartrate 100 MG tablet Commonly known as: LOPRESSOR TAKE 1 TABLET BY MOUTH TWICE A DAY   NovoLOG FlexPen 100 UNIT/ML FlexPen Generic drug: insulin aspart Inject 10-14 Units into the skin See admin instructions. Inject 10-14 units into the skin, PER SLIDING SCALE, in the morning, noon, and bedtime   omega-3 acid ethyl esters 1 g capsule Commonly known as: LOVAZA Take 1 g by mouth daily.   oxyCODONE 5 MG immediate release tablet Commonly  known as: Oxy IR/ROXICODONE Take 1 tablet (5 mg total) by mouth every 4 (four) hours as needed for severe pain (pain score 7-10).   pantoprazole 40 MG tablet Commonly known as: PROTONIX Take 40 mg by mouth daily as needed (Heartburn).   polyethylene glycol 17 g packet Commonly known as: MIRALAX / GLYCOLAX Take 17 g by mouth daily as needed for mild constipation.   PRESERVISION AREDS 2 PO Take 2 capsules by mouth daily.   venlafaxine 37.5 MG tablet Commonly known as: EFFEXOR Take 50 mg by mouth 2 (two) times daily with a meal.   vitamin D3 25 MCG tablet Commonly known as: CHOLECALCIFEROL Take 1 tablet (1,000 Units total) by mouth daily.        Follow-up Information     Haddix, Gillie Manners, MD. Schedule an appointment as soon as possible for a visit in 2 week(s).   Specialty: Orthopedic Surgery Why: For wound check and repeat x-rays Contact information: [address] [place] [address] [phone]                Allergies  Allergen Reactions   Vasotec Shortness Of Breath and Other (See Comments)    Wheezing, also   Atorvastatin  Other (See Comments)    Leg cramps    Codeine Nausea And Vomiting   Cymbalta [Duloxetine Hcl] Itching   Lansoprazole Itching, Swelling, Rash and Other (See Comments)    Redness, swelling of mouth. During 07/12/20-07/15/20 pt tolerated IV protonix infusion with no reactions.   Morphine And Codeine Itching   Sulfate Itching   Amitriptyline Hcl Other (See Comments)     sleepwalking   Escitalopram Oxalate Itching   Hydrocodone Itching   Iron Other (See Comments)    constipated   Sertraline Hcl Itching   Tramadol Itching   Adhesive [Tape] Rash   Allevyn Adhesive [Wound Dressings] Rash   Scopolamine Rash    Consultations: Orthopedics   Procedures/Studies: DG FEMUR PORT MIN 2 VIEWS LEFT  Result Date: 06/29/2023 CLINICAL DATA:  Postop. EXAM: LEFT FEMUR PORTABLE 2 VIEWS COMPARISON:  Preoperative imaging FINDINGS: Femoral  intramedullary nail with trans trochanteric and distal locking screw fixation traverse comminuted intertrochanteric femur fracture. Improved fracture alignment from preoperative imaging. Persistent displacement of lesser trochanteric fracture fragment. Recent postsurgical change includes air and edema in the soft tissues. Knee arthroplasty. IMPRESSION: ORIF of comminuted intertrochanteric femur fracture with improved alignment. Electronically Signed   By: Narda Rutherford M.D.   On: 06/29/2023 18:42   DG FEMUR MIN 2 VIEWS LEFT  Result Date: 06/29/2023 CLINICAL DATA:  Elective surgery. EXAM: LEFT FEMUR 2 VIEWS COMPARISON:  Preoperative imaging FINDINGS: Six fluoroscopic spot views of the left femur obtained in the operating room. Femoral intramedullary nail with trans trochanteric and distal locking screw fixation traverse proximal femur fracture. Fluoroscopy time 1 minute 13 seconds. Dose 6.35 mGy. IMPRESSION: Intraoperative fluoroscopy during proximal femur fracture fixation. Electronically Signed   By: Narda Rutherford M.D.   On: 06/29/2023 18:41   DG C-Arm 1-60 Min-No Report  Result Date: 06/29/2023 Fluoroscopy was utilized by the requesting physician.  No radiographic interpretation.   Chest Portable 1 View  Result Date: 06/29/2023 CLINICAL DATA:  Preoperative evaluation. EXAM: PORTABLE CHEST 1 VIEW COMPARISON:  November 19, 2020 FINDINGS: The heart size and mediastinal contours are within normal limits. There is marked severity calcification of the aortic arch. Both lungs are clear. Stable spinal stimulator wire positioning is seen with multilevel degenerative changes noted throughout the thoracic spine. IMPRESSION: No acute cardiopulmonary disease. Electronically Signed   By: Aram Candela M.D.   On: 06/29/2023 00:38   DG Hip Unilat With Pelvis 2-3 Views Left  Result Date: 06/28/2023 CLINICAL DATA:  Fall with hip fracture EXAM: DG HIP (WITH OR WITHOUT PELVIS) 2-3V LEFT COMPARISON:  08/11/2022  FINDINGS: Acute comminuted, displaced and angulated left intertrochanteric fracture. Femoral head projects in joint. Pubic symphysis appears intact. Faint calcifications at the right greater than left ischial tuberosity without significant change. IMPRESSION: Acute comminuted, displaced and angulated left intertrochanteric fracture. Electronically Signed   By: Jasmine Pang M.D.   On: 06/28/2023 23:25   DG Femur Min 2 Views Left  Result Date: 06/28/2023 CLINICAL DATA:  Hip fracture EXAM: LEFT FEMUR 2 VIEWS COMPARISON:  None Available. FINDINGS: Stimulator generator over the left lower quadrant. Acute comminuted and displaced left intertrochanteric fracture with mild varus angulation. No femoral head dislocation IMPRESSION: Acute comminuted, displaced and mildly angulated left intertrochanteric fracture. Electronically Signed   By: Jasmine Pang M.D.   On: 06/28/2023 23:24   DG Knee 2 Views Left  Result Date: 06/28/2023 CLINICAL DATA:  Hip problem EXAM: LEFT KNEE - 1-2 VIEW COMPARISON:  None Available. FINDINGS: Left knee replacement with intact  hardware and normal alignment. Trace knee effusion. No fracture is seen IMPRESSION: Left knee replacement with trace effusion. Electronically Signed   By: Jasmine Pang M.D.   On: 06/28/2023 23:23   ECHOCARDIOGRAM COMPLETE  Result Date: 06/27/2023    ECHOCARDIOGRAM REPORT   Patient Name:   NIKAYLA MADARIS Date of Exam: 06/27/2023 Medical Rec #:  [id number]        Height:       64.0 in Accession #:    [id number]       Weight:       136.0 lb Date of Birth:  1941-01-21         BSA:          1.661 m Patient Age:    82 years         BP:           116/68 mmHg Patient Gender: F                HR:           60 bpm. Exam Location:  [address] Procedure: 2D Echo, 3D Echo, Cardiac Doppler, Color Doppler and Strain Analysis Indications:    R06.02 SOB  History:        Patient has prior history of Echocardiogram examinations, most                 recent 11/18/2020. HFpEF,  CAD, S/p Watchman, Arrythmias:Atrial                 Fibrillation, Signs/Symptoms:Fatigue and Shortness of Breath;                 Risk Factors:Hypertension and Diabetes. Previous TEE revealed                 LVEF 60% moderate TR.  Sonographer:    Chanetta Marshall BA, RDCS Referring Phys: [id number] TESSA N CONTE IMPRESSIONS  1. Left ventricular ejection fraction, by estimation, is 60 to 65%. The left ventricle has normal function. The left ventricle has no regional wall motion abnormalities. Left ventricular diastolic parameters were normal. The average left ventricular global longitudinal strain is -17.1 %. The global longitudinal strain is normal.  2. Right ventricular systolic function is normal. The right ventricular size is normal. There is normal pulmonary artery systolic pressure. The estimated right ventricular systolic pressure is 33.2 mmHg.  3. The mitral valve is degenerative. Trivial mitral valve regurgitation. No evidence of mitral stenosis.  4. The aortic valve is tricuspid. Aortic valve regurgitation is not visualized. Aortic valve sclerosis is present, with no evidence of aortic valve stenosis.  5. The inferior vena cava is normal in size with greater than 50% respiratory variability, suggesting right atrial pressure of 3 mmHg. FINDINGS  Left Ventricle: Left ventricular ejection fraction, by estimation, is 60 to 65%. The left ventricle has normal function. The left ventricle has no regional wall motion abnormalities. The average left ventricular global longitudinal strain is -17.1 %. The global longitudinal strain is normal. The left ventricular internal cavity size was normal in size. There is no left ventricular hypertrophy. Left ventricular diastolic parameters were normal. Right Ventricle: The right ventricular size is normal. No increase in right ventricular wall thickness. Right ventricular systolic function is normal. There is normal pulmonary artery systolic pressure. The tricuspid regurgitant  velocity is 2.75 m/s, and  with an assumed right atrial pressure of 3 mmHg, the estimated right ventricular systolic pressure is 33.2 mmHg. Left Atrium: Left atrial size  was normal in size. Right Atrium: Right atrial size was normal in size. Pericardium: There is no evidence of pericardial effusion. Mitral Valve: The mitral valve is degenerative in appearance. There is mild thickening of the mitral valve leaflet(s). Normal mobility of the mitral valve leaflets. Mild mitral annular calcification. Trivial mitral valve regurgitation. No evidence of mitral valve stenosis. Tricuspid Valve: The tricuspid valve is normal in structure. Tricuspid valve regurgitation is mild . No evidence of tricuspid stenosis. Aortic Valve: The aortic valve is tricuspid. Aortic valve regurgitation is not visualized. Aortic valve sclerosis is present, with no evidence of aortic valve stenosis. Pulmonic Valve: The pulmonic valve was grossly normal. Pulmonic valve regurgitation is not visualized. No evidence of pulmonic stenosis. Aorta: The aortic root is normal in size and structure. Venous: The inferior vena cava is normal in size with greater than 50% respiratory variability, suggesting right atrial pressure of 3 mmHg. IAS/Shunts: The atrial septum is grossly normal.  LEFT VENTRICLE PLAX 2D LVIDd:         4.30 cm   Diastology LVIDs:         2.70 cm   LV e' medial:    9.46 cm/s LV PW:         0.90 cm   LV E/e' medial:  10.7 LV IVS:        1.00 cm   LV e' lateral:   9.03 cm/s LVOT diam:     2.00 cm   LV E/e' lateral: 11.2 LV SV:         51 LV SV Index:   31        2D Longitudinal Strain LVOT Area:     3.14 cm  2D Strain GLS (A2C):   -16.3 %                          2D Strain GLS (A3C):   -17.6 %                          2D Strain GLS (A4C):   -17.3 %                          2D Strain GLS Avg:     -17.1 %                           3D Volume EF:                          3D EF:        61 %                          LV EDV:       119 ml                           LV ESV:       47 ml                          LV SV:        72 ml RIGHT VENTRICLE            IVC RV Basal diam:  3.20 cm    IVC diam: 1.10 cm  RV S prime:     9.36 cm/s TAPSE (M-mode): 2.1 cm RVSP:           33.2 mmHg LEFT ATRIUM             Index        RIGHT ATRIUM           Index LA diam:        4.40 cm 2.65 cm/m   RA Pressure: 3.00 mmHg LA Vol (A2C):   31.6 ml 19.00 ml/m  RA Area:     12.40 cm LA Vol (A4C):   27.5 ml 16.56 ml/m  RA Volume:   28.20 ml  16.98 ml/m LA Biplane Vol: 38.9 ml 23.43 ml/m  AORTIC VALVE LVOT Vmax:   66.20 cm/s LVOT Vmean:  45.200 cm/s LVOT VTI:    0.162 m  AORTA Ao Root diam: 2.90 cm MITRAL VALVE                TRICUSPID VALVE MV Area (PHT): 3.89 cm     TR Peak grad:   30.2 mmHg MV Decel Time: 195 msec     TR Vmax:        275.00 cm/s MV E velocity: 101.00 cm/s  Estimated RAP:  3.00 mmHg MV A velocity: 27.20 cm/s   RVSP:           33.2 mmHg MV E/A ratio:  3.71                             SHUNTS                             Systemic VTI:  0.16 m                             Systemic Diam: 2.00 cm Sunit Tolia Electronically signed by Tessa Lerner Signature Date/Time: 06/27/2023/5:44:29 PM    Final       Subjective: Patient seen and examined at bedside denies any fever, vomiting, chest pain.  Complains of intermittent left hip pain.  Discharge Exam: Vitals:   07/03/23 0409 07/03/23 0711  BP: (!) 123/49 136/63  Pulse: 70 89  Resp: 18 18  Temp: 98 F (36.7 C) 98 F (36.7 C)  SpO2: 100% 100%    General: Pt is alert, awake, not in acute distress.  Elderly female lying in bed. Cardiovascular: rate controlled, S1/S2 + Respiratory: bilateral decreased breath sounds at bases with scattered crackles Abdominal: Soft, NT, ND, bowel sounds + Extremities: Trace lower extremity edema; no cyanosis    The results of significant diagnostics from this hospitalization (including imaging, microbiology, ancillary and laboratory) are listed below for reference.      Microbiology: Recent Results (from the past 240 hour(s))  MRSA Next Gen by PCR, Nasal     Status: None   Collection Time: 06/29/23 12:39 PM   Specimen: Nasal Mucosa; Nasal Swab  Result Value Ref Range Status   MRSA by PCR Next Gen NOT DETECTED NOT DETECTED Final    Comment: (NOTE) The GeneXpert MRSA Assay (FDA approved for NASAL specimens only), is one component of a comprehensive MRSA colonization surveillance program. It is not intended to diagnose MRSA infection nor to guide or monitor treatment for MRSA infections. Test performance is not FDA approved in patients less than 85 years old. Performed at [facility]  [facility], [address] [place], [place] [address]      Labs: BNP (last 3 results) No results for input(s): "BNP" in the last 8760 hours. Basic Metabolic Panel: Recent Labs  Lab 06/29/23 0735 06/30/23 0309 07/01/23 0315 07/02/23 0736 07/03/23 0622  NA 139 135 134* 137 140  K 4.6 4.6 3.7 4.2 4.6  CL 98 98 98 103 104  CO2 32 20* 27 28 29   GLUCOSE 182* 264* 249* 182* 177*  BUN 19 30* 39* 32* 23  CREATININE 1.15* 1.94* 1.76* 1.23* 0.81  CALCIUM 9.1 8.6* 8.3* 8.3* 8.6*  MG  --  2.1 2.4 2.6* 2.1   Liver Function Tests: Recent Labs  Lab 07/03/23 0622  AST 15  ALT 7  ALKPHOS 55  BILITOT 0.6  PROT 4.9*  ALBUMIN 2.2*   No results for input(s): "LIPASE", "AMYLASE" in the last 168 hours. No results for input(s): "AMMONIA" in the last 168 hours. CBC: Recent Labs  Lab 06/28/23 2034 06/29/23 0735 06/30/23 0309 07/01/23 0315 07/01/23 0650 07/02/23 0736 07/03/23 0622  WBC 14.4*   < > 15.5* 12.4* 12.3* 10.4 8.5  NEUTROABS 11.8*  --   --   --   --   --  5.8  HGB 14.8   < > 11.7* 7.7* 7.8* 7.0* 9.0*  HCT 46.7*   < > 36.7 23.8* 23.8* 22.2* 27.9*  MCV 91.6   < > 94.8 91.2 92.2 93.3 90.9  PLT 253   < > 218 138* 135* 148* 187   < > = values in this interval not displayed.   Cardiac Enzymes: No results for input(s): "CKTOTAL", "CKMB", "CKMBINDEX",  "TROPONINI" in the last 168 hours. BNP: Invalid input(s): "POCBNP" CBG: Recent Labs  Lab 07/02/23 1817 07/02/23 2122 07/02/23 2336 07/03/23 0410 07/03/23 0715  GLUCAP 295* 228* 235* 196* 159*   D-Dimer No results for input(s): "DDIMER" in the last 72 hours. Hgb A1c No results for input(s): "HGBA1C" in the last 72 hours. Lipid Profile No results for input(s): "CHOL", "HDL", "LDLCALC", "TRIG", "CHOLHDL", "LDLDIRECT" in the last 72 hours. Thyroid function studies No results for input(s): "TSH", "T4TOTAL", "T3FREE", "THYROIDAB" in the last 72 hours.  Invalid input(s): "FREET3" Anemia work up No results for input(s): "VITAMINB12", "FOLATE", "FERRITIN", "TIBC", "IRON", "RETICCTPCT" in the last 72 hours. Urinalysis    Component Value Date/Time   COLORURINE STRAW (A) 11/19/2020 1930   APPEARANCEUR CLEAR 11/19/2020 1930   LABSPEC 1.008 11/19/2020 1930   PHURINE 5.0 11/19/2020 1930   GLUCOSEU NEGATIVE 11/19/2020 1930   HGBUR NEGATIVE 11/19/2020 1930   BILIRUBINUR NEGATIVE 11/19/2020 1930   KETONESUR NEGATIVE 11/19/2020 1930   PROTEINUR NEGATIVE 11/19/2020 1930   UROBILINOGEN 0.2 03/20/2011 2257   NITRITE NEGATIVE 11/19/2020 1930   LEUKOCYTESUR NEGATIVE 11/19/2020 1930   Sepsis Labs Recent Labs  Lab 07/01/23 0315 07/01/23 0650 07/02/23 0736 07/03/23 0622  WBC 12.4* 12.3* 10.4 8.5   Microbiology Recent Results (from the past 240 hour(s))  MRSA Next Gen by PCR, Nasal     Status: None   Collection Time: 06/29/23 12:39 PM   Specimen: Nasal Mucosa; Nasal Swab  Result Value Ref Range Status   MRSA by PCR Next Gen NOT DETECTED NOT DETECTED Final    Comment: (NOTE) The GeneXpert MRSA Assay (FDA approved for NASAL specimens only), is one component of a comprehensive MRSA colonization surveillance program. It is not intended to diagnose MRSA infection nor to guide or monitor treatment for MRSA infections. Test performance is not  FDA approved in patients less than 76  years old. Performed at [facility], [address], [place], [place] [address]      Time coordinating discharge: 35 minutes  SIGNED:   Glade Lloyd, MD  [facility] 07/03/2023, 9:38 AM

## 2023-07-03 NOTE — Plan of Care (Signed)

## 2023-07-03 NOTE — TOC Progression Note (Addendum)
Transition of Care [facility]) - Progression Note    Patient Details  Name: [name] MRN: [id number] Date of Birth: 21-Mar-1941  [facility] of [facility] CM/SW Contact  Lorri Frederick, LCSW Phone Number: 07/03/2023, 8:25 AM  Clinical Narrative:  PASSR received: [id number] E.   CSW confirmed with [facility] that they can receive pt today.   Expected Discharge Plan: Skilled Nursing Facility Barriers to Discharge: Continued Medical Work up  Expected Discharge Plan and Services In-house Referral: Clinical Social Work     Living arrangements for the past 2 months: Single Family Home                                       Social Determinants of Health (SDOH) Interventions SDOH Screenings   Food Insecurity: No Food Insecurity (06/28/2023)  Housing: Low Risk  (06/28/2023)  Transportation Needs: No Transportation Needs (06/28/2023)  Utilities: Not At Risk (06/28/2023)  Financial Resource Strain: Low Risk  (05/11/2022)  Social Connections: Socially Integrated (05/11/2022)  Stress: No Stress Concern Present (05/11/2022)  Tobacco Use: Low Risk  (06/29/2023)    Readmission Risk Interventions     No data to display

## 2023-07-04 DIAGNOSIS — L89302 Pressure ulcer of unspecified buttock, stage 2: Secondary | ICD-10-CM | POA: Diagnosis not present

## 2023-07-08 DIAGNOSIS — Z9889 Other specified postprocedural states: Secondary | ICD-10-CM | POA: Diagnosis not present

## 2023-07-08 DIAGNOSIS — S72142A Displaced intertrochanteric fracture of left femur, initial encounter for closed fracture: Secondary | ICD-10-CM | POA: Diagnosis not present

## 2023-07-08 DIAGNOSIS — E46 Unspecified protein-calorie malnutrition: Secondary | ICD-10-CM | POA: Diagnosis not present

## 2023-07-08 DIAGNOSIS — K5900 Constipation, unspecified: Secondary | ICD-10-CM | POA: Diagnosis not present

## 2023-07-08 DIAGNOSIS — I480 Paroxysmal atrial fibrillation: Secondary | ICD-10-CM | POA: Diagnosis not present

## 2023-07-08 DIAGNOSIS — E1151 Type 2 diabetes mellitus with diabetic peripheral angiopathy without gangrene: Secondary | ICD-10-CM | POA: Diagnosis not present

## 2023-07-11 ENCOUNTER — Ambulatory Visit: Payer: Medicare PPO | Admitting: Cardiology

## 2023-07-11 DIAGNOSIS — L89302 Pressure ulcer of unspecified buttock, stage 2: Secondary | ICD-10-CM | POA: Diagnosis not present

## 2023-07-12 ENCOUNTER — Ambulatory Visit: Payer: Medicare PPO | Admitting: Physician Assistant

## 2023-07-18 DIAGNOSIS — L89302 Pressure ulcer of unspecified buttock, stage 2: Secondary | ICD-10-CM | POA: Diagnosis not present

## 2023-07-20 ENCOUNTER — Ambulatory Visit: Payer: Medicare PPO | Admitting: Podiatry

## 2023-07-24 DIAGNOSIS — E1122 Type 2 diabetes mellitus with diabetic chronic kidney disease: Secondary | ICD-10-CM | POA: Diagnosis not present

## 2023-07-24 DIAGNOSIS — N183 Chronic kidney disease, stage 3 unspecified: Secondary | ICD-10-CM | POA: Diagnosis not present

## 2023-07-24 DIAGNOSIS — S72142D Displaced intertrochanteric fracture of left femur, subsequent encounter for closed fracture with routine healing: Secondary | ICD-10-CM | POA: Diagnosis not present

## 2023-07-24 DIAGNOSIS — E1142 Type 2 diabetes mellitus with diabetic polyneuropathy: Secondary | ICD-10-CM | POA: Diagnosis not present

## 2023-07-24 DIAGNOSIS — L89322 Pressure ulcer of left buttock, stage 2: Secondary | ICD-10-CM | POA: Diagnosis not present

## 2023-07-24 DIAGNOSIS — I5032 Chronic diastolic (congestive) heart failure: Secondary | ICD-10-CM | POA: Diagnosis not present

## 2023-07-24 DIAGNOSIS — D631 Anemia in chronic kidney disease: Secondary | ICD-10-CM | POA: Diagnosis not present

## 2023-07-24 DIAGNOSIS — I480 Paroxysmal atrial fibrillation: Secondary | ICD-10-CM | POA: Diagnosis not present

## 2023-07-24 DIAGNOSIS — I130 Hypertensive heart and chronic kidney disease with heart failure and stage 1 through stage 4 chronic kidney disease, or unspecified chronic kidney disease: Secondary | ICD-10-CM | POA: Diagnosis not present

## 2023-07-24 DIAGNOSIS — N179 Acute kidney failure, unspecified: Secondary | ICD-10-CM | POA: Diagnosis not present

## 2023-07-25 DIAGNOSIS — S72142D Displaced intertrochanteric fracture of left femur, subsequent encounter for closed fracture with routine healing: Secondary | ICD-10-CM | POA: Diagnosis not present

## 2023-07-25 DIAGNOSIS — N179 Acute kidney failure, unspecified: Secondary | ICD-10-CM | POA: Diagnosis not present

## 2023-07-25 DIAGNOSIS — I130 Hypertensive heart and chronic kidney disease with heart failure and stage 1 through stage 4 chronic kidney disease, or unspecified chronic kidney disease: Secondary | ICD-10-CM | POA: Diagnosis not present

## 2023-07-25 DIAGNOSIS — I5032 Chronic diastolic (congestive) heart failure: Secondary | ICD-10-CM | POA: Diagnosis not present

## 2023-07-25 DIAGNOSIS — N183 Chronic kidney disease, stage 3 unspecified: Secondary | ICD-10-CM | POA: Diagnosis not present

## 2023-07-25 DIAGNOSIS — I480 Paroxysmal atrial fibrillation: Secondary | ICD-10-CM | POA: Diagnosis not present

## 2023-07-25 DIAGNOSIS — L89322 Pressure ulcer of left buttock, stage 2: Secondary | ICD-10-CM | POA: Diagnosis not present

## 2023-07-25 DIAGNOSIS — E1122 Type 2 diabetes mellitus with diabetic chronic kidney disease: Secondary | ICD-10-CM | POA: Diagnosis not present

## 2023-07-25 DIAGNOSIS — D631 Anemia in chronic kidney disease: Secondary | ICD-10-CM | POA: Diagnosis not present

## 2023-07-30 DIAGNOSIS — N183 Chronic kidney disease, stage 3 unspecified: Secondary | ICD-10-CM | POA: Diagnosis not present

## 2023-07-30 DIAGNOSIS — I130 Hypertensive heart and chronic kidney disease with heart failure and stage 1 through stage 4 chronic kidney disease, or unspecified chronic kidney disease: Secondary | ICD-10-CM | POA: Diagnosis not present

## 2023-07-30 DIAGNOSIS — E1122 Type 2 diabetes mellitus with diabetic chronic kidney disease: Secondary | ICD-10-CM | POA: Diagnosis not present

## 2023-07-30 DIAGNOSIS — S72142D Displaced intertrochanteric fracture of left femur, subsequent encounter for closed fracture with routine healing: Secondary | ICD-10-CM | POA: Diagnosis not present

## 2023-07-30 DIAGNOSIS — D631 Anemia in chronic kidney disease: Secondary | ICD-10-CM | POA: Diagnosis not present

## 2023-07-30 DIAGNOSIS — L89322 Pressure ulcer of left buttock, stage 2: Secondary | ICD-10-CM | POA: Diagnosis not present

## 2023-07-30 DIAGNOSIS — N179 Acute kidney failure, unspecified: Secondary | ICD-10-CM | POA: Diagnosis not present

## 2023-07-30 DIAGNOSIS — I480 Paroxysmal atrial fibrillation: Secondary | ICD-10-CM | POA: Diagnosis not present

## 2023-07-30 DIAGNOSIS — I5032 Chronic diastolic (congestive) heart failure: Secondary | ICD-10-CM | POA: Diagnosis not present

## 2023-07-31 DIAGNOSIS — I130 Hypertensive heart and chronic kidney disease with heart failure and stage 1 through stage 4 chronic kidney disease, or unspecified chronic kidney disease: Secondary | ICD-10-CM | POA: Diagnosis not present

## 2023-07-31 DIAGNOSIS — L89322 Pressure ulcer of left buttock, stage 2: Secondary | ICD-10-CM | POA: Diagnosis not present

## 2023-07-31 DIAGNOSIS — I5032 Chronic diastolic (congestive) heart failure: Secondary | ICD-10-CM | POA: Diagnosis not present

## 2023-07-31 DIAGNOSIS — D631 Anemia in chronic kidney disease: Secondary | ICD-10-CM | POA: Diagnosis not present

## 2023-07-31 DIAGNOSIS — N183 Chronic kidney disease, stage 3 unspecified: Secondary | ICD-10-CM | POA: Diagnosis not present

## 2023-07-31 DIAGNOSIS — E1122 Type 2 diabetes mellitus with diabetic chronic kidney disease: Secondary | ICD-10-CM | POA: Diagnosis not present

## 2023-07-31 DIAGNOSIS — I480 Paroxysmal atrial fibrillation: Secondary | ICD-10-CM | POA: Diagnosis not present

## 2023-07-31 DIAGNOSIS — N179 Acute kidney failure, unspecified: Secondary | ICD-10-CM | POA: Diagnosis not present

## 2023-07-31 DIAGNOSIS — S72142D Displaced intertrochanteric fracture of left femur, subsequent encounter for closed fracture with routine healing: Secondary | ICD-10-CM | POA: Diagnosis not present

## 2023-08-02 DIAGNOSIS — D631 Anemia in chronic kidney disease: Secondary | ICD-10-CM | POA: Diagnosis not present

## 2023-08-02 DIAGNOSIS — I480 Paroxysmal atrial fibrillation: Secondary | ICD-10-CM | POA: Diagnosis not present

## 2023-08-02 DIAGNOSIS — L89322 Pressure ulcer of left buttock, stage 2: Secondary | ICD-10-CM | POA: Diagnosis not present

## 2023-08-02 DIAGNOSIS — N183 Chronic kidney disease, stage 3 unspecified: Secondary | ICD-10-CM | POA: Diagnosis not present

## 2023-08-02 DIAGNOSIS — I5032 Chronic diastolic (congestive) heart failure: Secondary | ICD-10-CM | POA: Diagnosis not present

## 2023-08-02 DIAGNOSIS — E1122 Type 2 diabetes mellitus with diabetic chronic kidney disease: Secondary | ICD-10-CM | POA: Diagnosis not present

## 2023-08-02 DIAGNOSIS — I130 Hypertensive heart and chronic kidney disease with heart failure and stage 1 through stage 4 chronic kidney disease, or unspecified chronic kidney disease: Secondary | ICD-10-CM | POA: Diagnosis not present

## 2023-08-02 DIAGNOSIS — S72142D Displaced intertrochanteric fracture of left femur, subsequent encounter for closed fracture with routine healing: Secondary | ICD-10-CM | POA: Diagnosis not present

## 2023-08-02 DIAGNOSIS — N179 Acute kidney failure, unspecified: Secondary | ICD-10-CM | POA: Diagnosis not present

## 2023-08-07 DIAGNOSIS — N179 Acute kidney failure, unspecified: Secondary | ICD-10-CM | POA: Diagnosis not present

## 2023-08-07 DIAGNOSIS — S72142D Displaced intertrochanteric fracture of left femur, subsequent encounter for closed fracture with routine healing: Secondary | ICD-10-CM | POA: Diagnosis not present

## 2023-08-07 DIAGNOSIS — N183 Chronic kidney disease, stage 3 unspecified: Secondary | ICD-10-CM | POA: Diagnosis not present

## 2023-08-07 DIAGNOSIS — I480 Paroxysmal atrial fibrillation: Secondary | ICD-10-CM | POA: Diagnosis not present

## 2023-08-07 DIAGNOSIS — D631 Anemia in chronic kidney disease: Secondary | ICD-10-CM | POA: Diagnosis not present

## 2023-08-07 DIAGNOSIS — E1122 Type 2 diabetes mellitus with diabetic chronic kidney disease: Secondary | ICD-10-CM | POA: Diagnosis not present

## 2023-08-07 DIAGNOSIS — I5032 Chronic diastolic (congestive) heart failure: Secondary | ICD-10-CM | POA: Diagnosis not present

## 2023-08-07 DIAGNOSIS — L89322 Pressure ulcer of left buttock, stage 2: Secondary | ICD-10-CM | POA: Diagnosis not present

## 2023-08-07 DIAGNOSIS — I130 Hypertensive heart and chronic kidney disease with heart failure and stage 1 through stage 4 chronic kidney disease, or unspecified chronic kidney disease: Secondary | ICD-10-CM | POA: Diagnosis not present

## 2023-08-09 DIAGNOSIS — N183 Chronic kidney disease, stage 3 unspecified: Secondary | ICD-10-CM | POA: Diagnosis not present

## 2023-08-09 DIAGNOSIS — S72142D Displaced intertrochanteric fracture of left femur, subsequent encounter for closed fracture with routine healing: Secondary | ICD-10-CM | POA: Diagnosis not present

## 2023-08-09 DIAGNOSIS — N179 Acute kidney failure, unspecified: Secondary | ICD-10-CM | POA: Diagnosis not present

## 2023-08-09 DIAGNOSIS — E1122 Type 2 diabetes mellitus with diabetic chronic kidney disease: Secondary | ICD-10-CM | POA: Diagnosis not present

## 2023-08-09 DIAGNOSIS — I5032 Chronic diastolic (congestive) heart failure: Secondary | ICD-10-CM | POA: Diagnosis not present

## 2023-08-09 DIAGNOSIS — I130 Hypertensive heart and chronic kidney disease with heart failure and stage 1 through stage 4 chronic kidney disease, or unspecified chronic kidney disease: Secondary | ICD-10-CM | POA: Diagnosis not present

## 2023-08-09 DIAGNOSIS — I480 Paroxysmal atrial fibrillation: Secondary | ICD-10-CM | POA: Diagnosis not present

## 2023-08-09 DIAGNOSIS — D631 Anemia in chronic kidney disease: Secondary | ICD-10-CM | POA: Diagnosis not present

## 2023-08-09 DIAGNOSIS — L89322 Pressure ulcer of left buttock, stage 2: Secondary | ICD-10-CM | POA: Diagnosis not present

## 2023-08-13 DIAGNOSIS — E1122 Type 2 diabetes mellitus with diabetic chronic kidney disease: Secondary | ICD-10-CM | POA: Diagnosis not present

## 2023-08-13 DIAGNOSIS — S72142D Displaced intertrochanteric fracture of left femur, subsequent encounter for closed fracture with routine healing: Secondary | ICD-10-CM | POA: Diagnosis not present

## 2023-08-13 DIAGNOSIS — I130 Hypertensive heart and chronic kidney disease with heart failure and stage 1 through stage 4 chronic kidney disease, or unspecified chronic kidney disease: Secondary | ICD-10-CM | POA: Diagnosis not present

## 2023-08-13 DIAGNOSIS — N179 Acute kidney failure, unspecified: Secondary | ICD-10-CM | POA: Diagnosis not present

## 2023-08-13 DIAGNOSIS — I10 Essential (primary) hypertension: Secondary | ICD-10-CM | POA: Diagnosis not present

## 2023-08-13 DIAGNOSIS — I480 Paroxysmal atrial fibrillation: Secondary | ICD-10-CM | POA: Diagnosis not present

## 2023-08-13 DIAGNOSIS — N183 Chronic kidney disease, stage 3 unspecified: Secondary | ICD-10-CM | POA: Diagnosis not present

## 2023-08-13 DIAGNOSIS — D631 Anemia in chronic kidney disease: Secondary | ICD-10-CM | POA: Diagnosis not present

## 2023-08-13 DIAGNOSIS — I5032 Chronic diastolic (congestive) heart failure: Secondary | ICD-10-CM | POA: Diagnosis not present

## 2023-08-13 DIAGNOSIS — L89322 Pressure ulcer of left buttock, stage 2: Secondary | ICD-10-CM | POA: Diagnosis not present

## 2023-08-14 ENCOUNTER — Other Ambulatory Visit (HOSPITAL_COMMUNITY): Payer: Self-pay | Admitting: Student

## 2023-08-14 DIAGNOSIS — M7989 Other specified soft tissue disorders: Secondary | ICD-10-CM

## 2023-08-14 DIAGNOSIS — N183 Chronic kidney disease, stage 3 unspecified: Secondary | ICD-10-CM | POA: Diagnosis not present

## 2023-08-14 DIAGNOSIS — L89322 Pressure ulcer of left buttock, stage 2: Secondary | ICD-10-CM | POA: Diagnosis not present

## 2023-08-14 DIAGNOSIS — S72142D Displaced intertrochanteric fracture of left femur, subsequent encounter for closed fracture with routine healing: Secondary | ICD-10-CM | POA: Diagnosis not present

## 2023-08-14 DIAGNOSIS — I130 Hypertensive heart and chronic kidney disease with heart failure and stage 1 through stage 4 chronic kidney disease, or unspecified chronic kidney disease: Secondary | ICD-10-CM | POA: Diagnosis not present

## 2023-08-14 DIAGNOSIS — N179 Acute kidney failure, unspecified: Secondary | ICD-10-CM | POA: Diagnosis not present

## 2023-08-14 DIAGNOSIS — E1122 Type 2 diabetes mellitus with diabetic chronic kidney disease: Secondary | ICD-10-CM | POA: Diagnosis not present

## 2023-08-14 DIAGNOSIS — D631 Anemia in chronic kidney disease: Secondary | ICD-10-CM | POA: Diagnosis not present

## 2023-08-14 DIAGNOSIS — I480 Paroxysmal atrial fibrillation: Secondary | ICD-10-CM | POA: Diagnosis not present

## 2023-08-14 DIAGNOSIS — I5032 Chronic diastolic (congestive) heart failure: Secondary | ICD-10-CM | POA: Diagnosis not present

## 2023-08-15 ENCOUNTER — Ambulatory Visit (HOSPITAL_COMMUNITY)
Admission: RE | Admit: 2023-08-15 | Discharge: 2023-08-15 | Disposition: A | Discharge status: Home / Self Care | Payer: Medicare PPO | Source: Ambulatory Visit | Attending: Student | Admitting: Student

## 2023-08-15 DIAGNOSIS — D631 Anemia in chronic kidney disease: Secondary | ICD-10-CM | POA: Diagnosis not present

## 2023-08-15 DIAGNOSIS — E1122 Type 2 diabetes mellitus with diabetic chronic kidney disease: Secondary | ICD-10-CM | POA: Diagnosis not present

## 2023-08-15 DIAGNOSIS — N179 Acute kidney failure, unspecified: Secondary | ICD-10-CM | POA: Diagnosis not present

## 2023-08-15 DIAGNOSIS — M7989 Other specified soft tissue disorders: Secondary | ICD-10-CM | POA: Insufficient documentation

## 2023-08-15 DIAGNOSIS — I130 Hypertensive heart and chronic kidney disease with heart failure and stage 1 through stage 4 chronic kidney disease, or unspecified chronic kidney disease: Secondary | ICD-10-CM | POA: Diagnosis not present

## 2023-08-15 DIAGNOSIS — L89322 Pressure ulcer of left buttock, stage 2: Secondary | ICD-10-CM | POA: Diagnosis not present

## 2023-08-15 DIAGNOSIS — N183 Chronic kidney disease, stage 3 unspecified: Secondary | ICD-10-CM | POA: Diagnosis not present

## 2023-08-15 DIAGNOSIS — I480 Paroxysmal atrial fibrillation: Secondary | ICD-10-CM | POA: Diagnosis not present

## 2023-08-15 DIAGNOSIS — I5032 Chronic diastolic (congestive) heart failure: Secondary | ICD-10-CM | POA: Diagnosis not present

## 2023-08-15 DIAGNOSIS — S72142D Displaced intertrochanteric fracture of left femur, subsequent encounter for closed fracture with routine healing: Secondary | ICD-10-CM | POA: Diagnosis not present

## 2023-08-15 NOTE — Progress Notes (Addendum)
Lower extremity venous duplex completed. Please see CV Procedures for preliminary results.  Shona Simpson, RVT 08/15/23 2:22 PM

## 2023-08-17 ENCOUNTER — Emergency Department (HOSPITAL_COMMUNITY)
Admission: EM | Admit: 2023-08-17 | Discharge: 2023-08-17 | Disposition: A | Discharge status: Home / Self Care | Payer: Medicare PPO | Attending: Emergency Medicine | Admitting: Emergency Medicine

## 2023-08-17 DIAGNOSIS — I5032 Chronic diastolic (congestive) heart failure: Secondary | ICD-10-CM | POA: Diagnosis not present

## 2023-08-17 DIAGNOSIS — Z79899 Other long term (current) drug therapy: Secondary | ICD-10-CM | POA: Diagnosis not present

## 2023-08-17 DIAGNOSIS — I130 Hypertensive heart and chronic kidney disease with heart failure and stage 1 through stage 4 chronic kidney disease, or unspecified chronic kidney disease: Secondary | ICD-10-CM | POA: Diagnosis not present

## 2023-08-17 DIAGNOSIS — Z7982 Long term (current) use of aspirin: Secondary | ICD-10-CM | POA: Diagnosis not present

## 2023-08-17 DIAGNOSIS — D631 Anemia in chronic kidney disease: Secondary | ICD-10-CM | POA: Diagnosis not present

## 2023-08-17 DIAGNOSIS — Z794 Long term (current) use of insulin: Secondary | ICD-10-CM | POA: Insufficient documentation

## 2023-08-17 DIAGNOSIS — L89322 Pressure ulcer of left buttock, stage 2: Secondary | ICD-10-CM | POA: Diagnosis not present

## 2023-08-17 DIAGNOSIS — N179 Acute kidney failure, unspecified: Secondary | ICD-10-CM | POA: Diagnosis not present

## 2023-08-17 DIAGNOSIS — R6889 Other general symptoms and signs: Secondary | ICD-10-CM | POA: Diagnosis not present

## 2023-08-17 DIAGNOSIS — S72142D Displaced intertrochanteric fracture of left femur, subsequent encounter for closed fracture with routine healing: Secondary | ICD-10-CM | POA: Diagnosis not present

## 2023-08-17 DIAGNOSIS — I10 Essential (primary) hypertension: Secondary | ICD-10-CM | POA: Insufficient documentation

## 2023-08-17 DIAGNOSIS — I480 Paroxysmal atrial fibrillation: Secondary | ICD-10-CM | POA: Diagnosis not present

## 2023-08-17 DIAGNOSIS — E1122 Type 2 diabetes mellitus with diabetic chronic kidney disease: Secondary | ICD-10-CM | POA: Diagnosis not present

## 2023-08-17 DIAGNOSIS — N183 Chronic kidney disease, stage 3 unspecified: Secondary | ICD-10-CM | POA: Diagnosis not present

## 2023-08-17 NOTE — ED Provider Notes (Signed)
[place] EMERGENCY DEPARTMENT AT [facility] Provider Note   CSN: [id number] Arrival date & time: 08/17/23  1614     History  Chief Complaint  Patient presents with   Hypertension    [name] is a 82 y.o. female.  82 yo F with a cc of high blood pressure.  Noted by home health today upon visit.  Recent L hip repair.  Denies cp, sob, abdomina pain.  Denies one sided numbness or weakness difficulty with speech or swallowing.  She was recently restarted on losartan on Monday.  She had had an elevated blood pressure in her doctor's office.   Hypertension       Home Medications Prior to Admission medications   Medication Sig Start Date End Date Taking? Authorizing Provider  acetaminophen (TYLENOL) 500 MG tablet Take 1,000 mg by mouth every 8 (eight) hours as needed for mild pain (pain score 1-3) or headache.    [provider]  aspirin EC 81 MG tablet Take 1 tablet (81 mg total) by mouth daily. Swallow whole. 02/11/21   Lanier Prude, MD  cholecalciferol (CHOLECALCIFEROL) 25 MCG tablet Take 1 tablet (1,000 Units total) by mouth daily. 07/03/23   Glade Lloyd, MD  Continuous Blood Gluc Sensor (FREESTYLE LIBRE 14 DAY SENSOR) MISC Inject 1 patch into the skin every 14 (fourteen) days.    [provider]  docusate sodium (COLACE) 100 MG capsule Take 1 capsule (100 mg total) by mouth 2 (two) times daily. 07/03/23   Glade Lloyd, MD  DROPLET PEN NEEDLES 32G X 4 MM MISC  04/19/20   [provider]  famotidine (PEPCID) 40 MG tablet Take 40 mg by mouth daily as needed. 05/20/23   [provider]  gabapentin (NEURONTIN) 300 MG capsule Take 1 capsule (300 mg total) by mouth 3 (three) times daily. 07/03/23   Glade Lloyd, MD  insulin glargine (LANTUS) 100 UNIT/ML Solostar Pen Inject 19 Units into the skin at bedtime.    [provider]  ipratropium (ATROVENT) 0.06 % nasal spray Place 1 spray into both nostrils daily.     [provider]  JARDIANCE 10 MG TABS tablet Take by mouth. 10/27/21   [provider]  methocarbamol (ROBAXIN) 500 MG tablet Take 1 tablet (500 mg total) by mouth every 8 (eight) hours as needed for muscle spasms. 07/01/23   West Bali, PA-C  metoprolol tartrate (LOPRESSOR) 100 MG tablet TAKE 1 TABLET BY MOUTH TWICE A DAY 06/18/23   Jake Bathe, MD  Multiple Vitamins-Minerals (PRESERVISION AREDS 2 PO) Take 2 capsules by mouth daily.    [provider]  NOVOLOG FLEXPEN 100 UNIT/ML FlexPen Inject 10-14 Units into the skin See admin instructions. Inject 10-14 units into the skin, PER SLIDING SCALE, in the morning, noon, and bedtime 09/04/19   [provider]  omega-3 acid ethyl esters (LOVAZA) 1 g capsule Take 1 g by mouth daily.    [provider]  oxyCODONE (OXY IR/ROXICODONE) 5 MG immediate release tablet Take 1 tablet (5 mg total) by mouth every 4 (four) hours as needed for severe pain (pain score 7-10). 07/01/23   [place] Bali, PA-C  pantoprazole (PROTONIX) 40 MG tablet Take 40 mg by mouth daily as needed (Heartburn).    [provider]  Pitavastatin Calcium (LIVALO) 1 MG TABS Take 2 mg by mouth at bedtime.    [provider]  polyethylene glycol (MIRALAX / GLYCOLAX) 17 g packet Take 17 g by  mouth daily as needed for mild constipation. 07/03/23   Glade Lloyd, MD  venlafaxine (EFFEXOR) 37.5 MG tablet Take 50 mg by mouth 2 (two) times daily with a meal. 10/27/19   [provider]      Allergies    Vasotec, Atorvastatin, Codeine, Cymbalta [duloxetine hcl], Lansoprazole, Morphine and codeine, Sulfate, Amitriptyline hcl, Escitalopram oxalate, Hydrocodone, Iron, Sertraline hcl, Tramadol, Adhesive [tape], Allevyn adhesive [wound dressings], and Scopolamine    Review of Systems   Review of Systems  Physical Exam Updated Vital Signs BP (!) 203/86 (BP Location: Right Arm)   Pulse 72   Temp 98.2 F (36.8 C) (Oral)   Resp  16   SpO2 95%  Physical Exam Vitals and nursing note reviewed.  Constitutional:      General: She is not in acute distress.    Appearance: She is well-developed. She is not diaphoretic.  HENT:     Head: Normocephalic and atraumatic.  Eyes:     Pupils: Pupils are equal, round, and reactive to light.  Cardiovascular:     Rate and Rhythm: Normal rate and regular rhythm.     Heart sounds: No murmur heard.    No friction rub. No gallop.  Pulmonary:     Effort: Pulmonary effort is normal.     Breath sounds: No wheezing or rales.  Abdominal:     General: There is no distension.     Palpations: Abdomen is soft.     Tenderness: There is no abdominal tenderness.  Musculoskeletal:        General: No tenderness.     Cervical back: Normal range of motion and neck supple.  Skin:    General: Skin is warm and dry.  Neurological:     Mental Status: She is alert and oriented to person, place, and time.     Cranial Nerves: Cranial nerves 2-12 are intact.     Sensory: Sensation is intact.     Coordination: Coordination is intact.     Comments: With the exception of pain limiting her left lower extremity the patient has a benign neurologic exam.  Psychiatric:        Behavior: Behavior normal.     ED Results / Procedures / Treatments   Labs (all labs ordered are listed, but only abnormal results are displayed) Labs Reviewed - No data to display  EKG None  Radiology No results found.  Procedures Procedures    Medications Ordered in ED Medications - No data to display  ED Course/ Medical Decision Making/ A&P                                 Medical Decision Making  82 yo F with a chief complaints of high blood pressure.  Patient has no other symptoms.  I discussed with her the current rationale for not treating asymptomatic high blood pressure.  She was recently started on a new blood pressure medication.  I do not feel that adding another medication would be beneficial.  She  tells me she has had high blood pressure since she was 82 years old so therefore has been on medications for about 60 years.  I encouraged her to follow-up with her family doctor in the office. Patient asymptomatic with no noted s/s of end organ damage.  No chest pain, diaphoresis, nausea or other acs symptoms.  No headache or neurologic complaints,  no unequal pulses, normal pulse ox without  rales or sob.  Feel this is unlikely to be a Hypertensive Emergency and recent studies suggest no benefit for inpatient admission.  There are also no studies to my knowledge suggesting that patients with hypertensive urgency have increased risk for end organ disease. In fact there has been a study recently that would suggest that the rapid change can induce harm.  The [employer] policy statement on asymptomatic hypertension does not  recommend routing ED medical intervention. The patient will follow up closely with their PCP.  Compliance with their medication stressed.   The management of elevated blood pressure in the acute care setting: a scientific statement from the American Heart Association Bress AP, Jena Gauss, Flack JM, et al. Hypertension. May 2024. doi: 10.1161/HYP.0000000000000238  Chester Holstein, Christell Constant EH, et al. Characteristics and outcomes of patients presenting with hypertensive urgency in the office setting. JAMA Intern Med. 2016 Jul 1; 176(7): 981-8.   Cerebrovascular risks with rapid blood pressure lowering in the absence of hypertensive emergency Laverle Hobby, Ronalee Red, et al. Am J Emerg Med. 2019;37(6):1073-1077.  4:43 PM:  I have discussed the diagnosis/risks/treatment options with the Patient and husband.  Evaluation and diagnostic testing in the emergency department does not suggest an emergent condition requiring admission or immediate intervention beyond what has been performed at this time.  They will follow up with  PCP. We also discussed returning to  the ED immediately if new or worsening sx occur. We discussed the sx which are most concerning (e.g., sudden worsening pain, fever, inability to tolerate by mouth) that necessitate immediate return. Medications administered to the patient during their visit and any new prescriptions provided to the patient are listed below.  Medications given during this visit Medications - No data to display   The patient appears reasonably screen and/or stabilized for discharge and I doubt any other medical condition or other [facility] requiring further screening, evaluation, or treatment in the ED at this time prior to discharge.           Final Clinical Impression(s) / ED Diagnoses Final diagnoses:  Asymptomatic hypertension    Rx / DC Orders ED Discharge Orders     None         Melene Plan, DO 08/17/23 1643

## 2023-08-17 NOTE — Discharge Instructions (Signed)
As we had discussed please return for headache chest pain difficulty breathing abdominal pain one-sided numbness or weakness or difficulty speech or swallowing.  Please follow-up with your family doctor.

## 2023-08-17 NOTE — ED Triage Notes (Signed)
Pt [facility] form home, called out by home health RN for hypertension. No other complaints.  Pt had recent hip fx.    208/80 HR 80 CBG 120 95% RA

## 2023-08-21 DIAGNOSIS — D631 Anemia in chronic kidney disease: Secondary | ICD-10-CM | POA: Diagnosis not present

## 2023-08-21 DIAGNOSIS — N183 Chronic kidney disease, stage 3 unspecified: Secondary | ICD-10-CM | POA: Diagnosis not present

## 2023-08-21 DIAGNOSIS — N179 Acute kidney failure, unspecified: Secondary | ICD-10-CM | POA: Diagnosis not present

## 2023-08-21 DIAGNOSIS — I130 Hypertensive heart and chronic kidney disease with heart failure and stage 1 through stage 4 chronic kidney disease, or unspecified chronic kidney disease: Secondary | ICD-10-CM | POA: Diagnosis not present

## 2023-08-21 DIAGNOSIS — L89322 Pressure ulcer of left buttock, stage 2: Secondary | ICD-10-CM | POA: Diagnosis not present

## 2023-08-21 DIAGNOSIS — E1122 Type 2 diabetes mellitus with diabetic chronic kidney disease: Secondary | ICD-10-CM | POA: Diagnosis not present

## 2023-08-21 DIAGNOSIS — S72142D Displaced intertrochanteric fracture of left femur, subsequent encounter for closed fracture with routine healing: Secondary | ICD-10-CM | POA: Diagnosis not present

## 2023-08-21 DIAGNOSIS — I480 Paroxysmal atrial fibrillation: Secondary | ICD-10-CM | POA: Diagnosis not present

## 2023-08-21 DIAGNOSIS — I5032 Chronic diastolic (congestive) heart failure: Secondary | ICD-10-CM | POA: Diagnosis not present

## 2023-08-28 DIAGNOSIS — I480 Paroxysmal atrial fibrillation: Secondary | ICD-10-CM | POA: Diagnosis not present

## 2023-08-28 DIAGNOSIS — N179 Acute kidney failure, unspecified: Secondary | ICD-10-CM | POA: Diagnosis not present

## 2023-08-28 DIAGNOSIS — N183 Chronic kidney disease, stage 3 unspecified: Secondary | ICD-10-CM | POA: Diagnosis not present

## 2023-08-28 DIAGNOSIS — E1122 Type 2 diabetes mellitus with diabetic chronic kidney disease: Secondary | ICD-10-CM | POA: Diagnosis not present

## 2023-08-28 DIAGNOSIS — I130 Hypertensive heart and chronic kidney disease with heart failure and stage 1 through stage 4 chronic kidney disease, or unspecified chronic kidney disease: Secondary | ICD-10-CM | POA: Diagnosis not present

## 2023-08-28 DIAGNOSIS — S72142D Displaced intertrochanteric fracture of left femur, subsequent encounter for closed fracture with routine healing: Secondary | ICD-10-CM | POA: Diagnosis not present

## 2023-08-28 DIAGNOSIS — D631 Anemia in chronic kidney disease: Secondary | ICD-10-CM | POA: Diagnosis not present

## 2023-08-28 DIAGNOSIS — L89322 Pressure ulcer of left buttock, stage 2: Secondary | ICD-10-CM | POA: Diagnosis not present

## 2023-08-28 DIAGNOSIS — I5032 Chronic diastolic (congestive) heart failure: Secondary | ICD-10-CM | POA: Diagnosis not present

## 2023-09-03 DIAGNOSIS — D631 Anemia in chronic kidney disease: Secondary | ICD-10-CM | POA: Diagnosis not present

## 2023-09-03 DIAGNOSIS — I5032 Chronic diastolic (congestive) heart failure: Secondary | ICD-10-CM | POA: Diagnosis not present

## 2023-09-03 DIAGNOSIS — N179 Acute kidney failure, unspecified: Secondary | ICD-10-CM | POA: Diagnosis not present

## 2023-09-03 DIAGNOSIS — I480 Paroxysmal atrial fibrillation: Secondary | ICD-10-CM | POA: Diagnosis not present

## 2023-09-03 DIAGNOSIS — N183 Chronic kidney disease, stage 3 unspecified: Secondary | ICD-10-CM | POA: Diagnosis not present

## 2023-09-03 DIAGNOSIS — I130 Hypertensive heart and chronic kidney disease with heart failure and stage 1 through stage 4 chronic kidney disease, or unspecified chronic kidney disease: Secondary | ICD-10-CM | POA: Diagnosis not present

## 2023-09-03 DIAGNOSIS — E1122 Type 2 diabetes mellitus with diabetic chronic kidney disease: Secondary | ICD-10-CM | POA: Diagnosis not present

## 2023-09-03 DIAGNOSIS — L89322 Pressure ulcer of left buttock, stage 2: Secondary | ICD-10-CM | POA: Diagnosis not present

## 2023-09-03 DIAGNOSIS — S72142D Displaced intertrochanteric fracture of left femur, subsequent encounter for closed fracture with routine healing: Secondary | ICD-10-CM | POA: Diagnosis not present

## 2023-09-04 DIAGNOSIS — E1122 Type 2 diabetes mellitus with diabetic chronic kidney disease: Secondary | ICD-10-CM | POA: Diagnosis not present

## 2023-09-04 DIAGNOSIS — D631 Anemia in chronic kidney disease: Secondary | ICD-10-CM | POA: Diagnosis not present

## 2023-09-04 DIAGNOSIS — S72142D Displaced intertrochanteric fracture of left femur, subsequent encounter for closed fracture with routine healing: Secondary | ICD-10-CM | POA: Diagnosis not present

## 2023-09-04 DIAGNOSIS — N183 Chronic kidney disease, stage 3 unspecified: Secondary | ICD-10-CM | POA: Diagnosis not present

## 2023-09-04 DIAGNOSIS — I130 Hypertensive heart and chronic kidney disease with heart failure and stage 1 through stage 4 chronic kidney disease, or unspecified chronic kidney disease: Secondary | ICD-10-CM | POA: Diagnosis not present

## 2023-09-04 DIAGNOSIS — I5032 Chronic diastolic (congestive) heart failure: Secondary | ICD-10-CM | POA: Diagnosis not present

## 2023-09-04 DIAGNOSIS — I480 Paroxysmal atrial fibrillation: Secondary | ICD-10-CM | POA: Diagnosis not present

## 2023-09-04 DIAGNOSIS — L89322 Pressure ulcer of left buttock, stage 2: Secondary | ICD-10-CM | POA: Diagnosis not present

## 2023-09-04 DIAGNOSIS — N179 Acute kidney failure, unspecified: Secondary | ICD-10-CM | POA: Diagnosis not present

## 2023-09-06 DIAGNOSIS — M810 Age-related osteoporosis without current pathological fracture: Secondary | ICD-10-CM | POA: Diagnosis not present

## 2023-09-06 DIAGNOSIS — I10 Essential (primary) hypertension: Secondary | ICD-10-CM | POA: Diagnosis not present

## 2023-09-06 DIAGNOSIS — M7989 Other specified soft tissue disorders: Secondary | ICD-10-CM | POA: Diagnosis not present

## 2023-09-06 DIAGNOSIS — I5032 Chronic diastolic (congestive) heart failure: Secondary | ICD-10-CM | POA: Diagnosis not present

## 2023-09-06 DIAGNOSIS — I480 Paroxysmal atrial fibrillation: Secondary | ICD-10-CM | POA: Diagnosis not present

## 2023-09-06 DIAGNOSIS — E1142 Type 2 diabetes mellitus with diabetic polyneuropathy: Secondary | ICD-10-CM | POA: Diagnosis not present

## 2023-09-11 DIAGNOSIS — I480 Paroxysmal atrial fibrillation: Secondary | ICD-10-CM | POA: Diagnosis not present

## 2023-09-11 DIAGNOSIS — L89322 Pressure ulcer of left buttock, stage 2: Secondary | ICD-10-CM | POA: Diagnosis not present

## 2023-09-11 DIAGNOSIS — I130 Hypertensive heart and chronic kidney disease with heart failure and stage 1 through stage 4 chronic kidney disease, or unspecified chronic kidney disease: Secondary | ICD-10-CM | POA: Diagnosis not present

## 2023-09-11 DIAGNOSIS — E1122 Type 2 diabetes mellitus with diabetic chronic kidney disease: Secondary | ICD-10-CM | POA: Diagnosis not present

## 2023-09-11 DIAGNOSIS — N183 Chronic kidney disease, stage 3 unspecified: Secondary | ICD-10-CM | POA: Diagnosis not present

## 2023-09-11 DIAGNOSIS — S72142D Displaced intertrochanteric fracture of left femur, subsequent encounter for closed fracture with routine healing: Secondary | ICD-10-CM | POA: Diagnosis not present

## 2023-09-11 DIAGNOSIS — D631 Anemia in chronic kidney disease: Secondary | ICD-10-CM | POA: Diagnosis not present

## 2023-09-11 DIAGNOSIS — I5032 Chronic diastolic (congestive) heart failure: Secondary | ICD-10-CM | POA: Diagnosis not present

## 2023-09-11 DIAGNOSIS — N179 Acute kidney failure, unspecified: Secondary | ICD-10-CM | POA: Diagnosis not present

## 2023-09-13 DIAGNOSIS — I130 Hypertensive heart and chronic kidney disease with heart failure and stage 1 through stage 4 chronic kidney disease, or unspecified chronic kidney disease: Secondary | ICD-10-CM | POA: Diagnosis not present

## 2023-09-13 DIAGNOSIS — D631 Anemia in chronic kidney disease: Secondary | ICD-10-CM | POA: Diagnosis not present

## 2023-09-13 DIAGNOSIS — S72142D Displaced intertrochanteric fracture of left femur, subsequent encounter for closed fracture with routine healing: Secondary | ICD-10-CM | POA: Diagnosis not present

## 2023-09-13 DIAGNOSIS — I5032 Chronic diastolic (congestive) heart failure: Secondary | ICD-10-CM | POA: Diagnosis not present

## 2023-09-13 DIAGNOSIS — L89322 Pressure ulcer of left buttock, stage 2: Secondary | ICD-10-CM | POA: Diagnosis not present

## 2023-09-13 DIAGNOSIS — E1122 Type 2 diabetes mellitus with diabetic chronic kidney disease: Secondary | ICD-10-CM | POA: Diagnosis not present

## 2023-09-13 DIAGNOSIS — N179 Acute kidney failure, unspecified: Secondary | ICD-10-CM | POA: Diagnosis not present

## 2023-09-13 DIAGNOSIS — N183 Chronic kidney disease, stage 3 unspecified: Secondary | ICD-10-CM | POA: Diagnosis not present

## 2023-09-13 DIAGNOSIS — I480 Paroxysmal atrial fibrillation: Secondary | ICD-10-CM | POA: Diagnosis not present

## 2023-09-19 DIAGNOSIS — I480 Paroxysmal atrial fibrillation: Secondary | ICD-10-CM | POA: Diagnosis not present

## 2023-09-19 DIAGNOSIS — L89322 Pressure ulcer of left buttock, stage 2: Secondary | ICD-10-CM | POA: Diagnosis not present

## 2023-09-19 DIAGNOSIS — I5032 Chronic diastolic (congestive) heart failure: Secondary | ICD-10-CM | POA: Diagnosis not present

## 2023-09-19 DIAGNOSIS — D631 Anemia in chronic kidney disease: Secondary | ICD-10-CM | POA: Diagnosis not present

## 2023-09-19 DIAGNOSIS — E1122 Type 2 diabetes mellitus with diabetic chronic kidney disease: Secondary | ICD-10-CM | POA: Diagnosis not present

## 2023-09-19 DIAGNOSIS — N179 Acute kidney failure, unspecified: Secondary | ICD-10-CM | POA: Diagnosis not present

## 2023-09-19 DIAGNOSIS — S72142D Displaced intertrochanteric fracture of left femur, subsequent encounter for closed fracture with routine healing: Secondary | ICD-10-CM | POA: Diagnosis not present

## 2023-09-19 DIAGNOSIS — I130 Hypertensive heart and chronic kidney disease with heart failure and stage 1 through stage 4 chronic kidney disease, or unspecified chronic kidney disease: Secondary | ICD-10-CM | POA: Diagnosis not present

## 2023-09-19 DIAGNOSIS — N183 Chronic kidney disease, stage 3 unspecified: Secondary | ICD-10-CM | POA: Diagnosis not present

## 2023-09-20 DIAGNOSIS — N179 Acute kidney failure, unspecified: Secondary | ICD-10-CM | POA: Diagnosis not present

## 2023-09-20 DIAGNOSIS — I480 Paroxysmal atrial fibrillation: Secondary | ICD-10-CM | POA: Diagnosis not present

## 2023-09-20 DIAGNOSIS — I5032 Chronic diastolic (congestive) heart failure: Secondary | ICD-10-CM | POA: Diagnosis not present

## 2023-09-20 DIAGNOSIS — I130 Hypertensive heart and chronic kidney disease with heart failure and stage 1 through stage 4 chronic kidney disease, or unspecified chronic kidney disease: Secondary | ICD-10-CM | POA: Diagnosis not present

## 2023-09-20 DIAGNOSIS — D631 Anemia in chronic kidney disease: Secondary | ICD-10-CM | POA: Diagnosis not present

## 2023-09-20 DIAGNOSIS — E1122 Type 2 diabetes mellitus with diabetic chronic kidney disease: Secondary | ICD-10-CM | POA: Diagnosis not present

## 2023-09-20 DIAGNOSIS — L89322 Pressure ulcer of left buttock, stage 2: Secondary | ICD-10-CM | POA: Diagnosis not present

## 2023-09-20 DIAGNOSIS — N183 Chronic kidney disease, stage 3 unspecified: Secondary | ICD-10-CM | POA: Diagnosis not present

## 2023-09-20 DIAGNOSIS — S72142D Displaced intertrochanteric fracture of left femur, subsequent encounter for closed fracture with routine healing: Secondary | ICD-10-CM | POA: Diagnosis not present

## 2023-09-24 DIAGNOSIS — M7989 Other specified soft tissue disorders: Secondary | ICD-10-CM | POA: Diagnosis not present

## 2023-09-24 DIAGNOSIS — E1151 Type 2 diabetes mellitus with diabetic peripheral angiopathy without gangrene: Secondary | ICD-10-CM | POA: Diagnosis not present

## 2023-09-24 DIAGNOSIS — I10 Essential (primary) hypertension: Secondary | ICD-10-CM | POA: Diagnosis not present

## 2023-09-24 DIAGNOSIS — D631 Anemia in chronic kidney disease: Secondary | ICD-10-CM | POA: Diagnosis not present

## 2023-09-24 DIAGNOSIS — E1122 Type 2 diabetes mellitus with diabetic chronic kidney disease: Secondary | ICD-10-CM | POA: Diagnosis not present

## 2023-09-24 DIAGNOSIS — S72142D Displaced intertrochanteric fracture of left femur, subsequent encounter for closed fracture with routine healing: Secondary | ICD-10-CM | POA: Diagnosis not present

## 2023-09-24 DIAGNOSIS — N183 Chronic kidney disease, stage 3 unspecified: Secondary | ICD-10-CM | POA: Diagnosis not present

## 2023-09-24 DIAGNOSIS — E1142 Type 2 diabetes mellitus with diabetic polyneuropathy: Secondary | ICD-10-CM | POA: Diagnosis not present

## 2023-09-24 DIAGNOSIS — I5032 Chronic diastolic (congestive) heart failure: Secondary | ICD-10-CM | POA: Diagnosis not present

## 2023-09-24 DIAGNOSIS — I480 Paroxysmal atrial fibrillation: Secondary | ICD-10-CM | POA: Diagnosis not present

## 2023-09-24 DIAGNOSIS — I130 Hypertensive heart and chronic kidney disease with heart failure and stage 1 through stage 4 chronic kidney disease, or unspecified chronic kidney disease: Secondary | ICD-10-CM | POA: Diagnosis not present

## 2023-09-24 DIAGNOSIS — M810 Age-related osteoporosis without current pathological fracture: Secondary | ICD-10-CM | POA: Diagnosis not present

## 2023-09-25 DIAGNOSIS — S72142D Displaced intertrochanteric fracture of left femur, subsequent encounter for closed fracture with routine healing: Secondary | ICD-10-CM | POA: Diagnosis not present

## 2023-09-26 DIAGNOSIS — E1122 Type 2 diabetes mellitus with diabetic chronic kidney disease: Secondary | ICD-10-CM | POA: Diagnosis not present

## 2023-09-26 DIAGNOSIS — I480 Paroxysmal atrial fibrillation: Secondary | ICD-10-CM | POA: Diagnosis not present

## 2023-09-26 DIAGNOSIS — I5032 Chronic diastolic (congestive) heart failure: Secondary | ICD-10-CM | POA: Diagnosis not present

## 2023-09-26 DIAGNOSIS — N183 Chronic kidney disease, stage 3 unspecified: Secondary | ICD-10-CM | POA: Diagnosis not present

## 2023-09-26 DIAGNOSIS — S72142D Displaced intertrochanteric fracture of left femur, subsequent encounter for closed fracture with routine healing: Secondary | ICD-10-CM | POA: Diagnosis not present

## 2023-09-26 DIAGNOSIS — E1142 Type 2 diabetes mellitus with diabetic polyneuropathy: Secondary | ICD-10-CM | POA: Diagnosis not present

## 2023-09-26 DIAGNOSIS — I130 Hypertensive heart and chronic kidney disease with heart failure and stage 1 through stage 4 chronic kidney disease, or unspecified chronic kidney disease: Secondary | ICD-10-CM | POA: Diagnosis not present

## 2023-09-26 DIAGNOSIS — E1151 Type 2 diabetes mellitus with diabetic peripheral angiopathy without gangrene: Secondary | ICD-10-CM | POA: Diagnosis not present

## 2023-09-26 DIAGNOSIS — D631 Anemia in chronic kidney disease: Secondary | ICD-10-CM | POA: Diagnosis not present

## 2023-09-27 DIAGNOSIS — I480 Paroxysmal atrial fibrillation: Secondary | ICD-10-CM | POA: Diagnosis not present

## 2023-09-27 DIAGNOSIS — S72142D Displaced intertrochanteric fracture of left femur, subsequent encounter for closed fracture with routine healing: Secondary | ICD-10-CM | POA: Diagnosis not present

## 2023-09-27 DIAGNOSIS — E1151 Type 2 diabetes mellitus with diabetic peripheral angiopathy without gangrene: Secondary | ICD-10-CM | POA: Diagnosis not present

## 2023-09-27 DIAGNOSIS — E1142 Type 2 diabetes mellitus with diabetic polyneuropathy: Secondary | ICD-10-CM | POA: Diagnosis not present

## 2023-09-27 DIAGNOSIS — I5032 Chronic diastolic (congestive) heart failure: Secondary | ICD-10-CM | POA: Diagnosis not present

## 2023-09-27 DIAGNOSIS — N183 Chronic kidney disease, stage 3 unspecified: Secondary | ICD-10-CM | POA: Diagnosis not present

## 2023-09-27 DIAGNOSIS — E1122 Type 2 diabetes mellitus with diabetic chronic kidney disease: Secondary | ICD-10-CM | POA: Diagnosis not present

## 2023-09-27 DIAGNOSIS — D631 Anemia in chronic kidney disease: Secondary | ICD-10-CM | POA: Diagnosis not present

## 2023-09-27 DIAGNOSIS — I130 Hypertensive heart and chronic kidney disease with heart failure and stage 1 through stage 4 chronic kidney disease, or unspecified chronic kidney disease: Secondary | ICD-10-CM | POA: Diagnosis not present

## 2023-10-02 DIAGNOSIS — E1142 Type 2 diabetes mellitus with diabetic polyneuropathy: Secondary | ICD-10-CM | POA: Diagnosis not present

## 2023-10-02 DIAGNOSIS — Z794 Long term (current) use of insulin: Secondary | ICD-10-CM | POA: Diagnosis not present

## 2023-10-02 DIAGNOSIS — I509 Heart failure, unspecified: Secondary | ICD-10-CM | POA: Diagnosis not present

## 2023-10-03 DIAGNOSIS — I480 Paroxysmal atrial fibrillation: Secondary | ICD-10-CM | POA: Diagnosis not present

## 2023-10-03 DIAGNOSIS — I130 Hypertensive heart and chronic kidney disease with heart failure and stage 1 through stage 4 chronic kidney disease, or unspecified chronic kidney disease: Secondary | ICD-10-CM | POA: Diagnosis not present

## 2023-10-03 DIAGNOSIS — E1142 Type 2 diabetes mellitus with diabetic polyneuropathy: Secondary | ICD-10-CM | POA: Diagnosis not present

## 2023-10-03 DIAGNOSIS — I5032 Chronic diastolic (congestive) heart failure: Secondary | ICD-10-CM | POA: Diagnosis not present

## 2023-10-03 DIAGNOSIS — S72142D Displaced intertrochanteric fracture of left femur, subsequent encounter for closed fracture with routine healing: Secondary | ICD-10-CM | POA: Diagnosis not present

## 2023-10-03 DIAGNOSIS — N183 Chronic kidney disease, stage 3 unspecified: Secondary | ICD-10-CM | POA: Diagnosis not present

## 2023-10-03 DIAGNOSIS — E1122 Type 2 diabetes mellitus with diabetic chronic kidney disease: Secondary | ICD-10-CM | POA: Diagnosis not present

## 2023-10-03 DIAGNOSIS — E1151 Type 2 diabetes mellitus with diabetic peripheral angiopathy without gangrene: Secondary | ICD-10-CM | POA: Diagnosis not present

## 2023-10-03 DIAGNOSIS — D631 Anemia in chronic kidney disease: Secondary | ICD-10-CM | POA: Diagnosis not present

## 2023-10-04 ENCOUNTER — Other Ambulatory Visit: Payer: Self-pay | Admitting: Cardiology

## 2023-10-04 DIAGNOSIS — I130 Hypertensive heart and chronic kidney disease with heart failure and stage 1 through stage 4 chronic kidney disease, or unspecified chronic kidney disease: Secondary | ICD-10-CM | POA: Diagnosis not present

## 2023-10-04 DIAGNOSIS — N183 Chronic kidney disease, stage 3 unspecified: Secondary | ICD-10-CM | POA: Diagnosis not present

## 2023-10-04 DIAGNOSIS — E1122 Type 2 diabetes mellitus with diabetic chronic kidney disease: Secondary | ICD-10-CM | POA: Diagnosis not present

## 2023-10-04 DIAGNOSIS — E1142 Type 2 diabetes mellitus with diabetic polyneuropathy: Secondary | ICD-10-CM | POA: Diagnosis not present

## 2023-10-04 DIAGNOSIS — I5032 Chronic diastolic (congestive) heart failure: Secondary | ICD-10-CM | POA: Diagnosis not present

## 2023-10-04 DIAGNOSIS — I480 Paroxysmal atrial fibrillation: Secondary | ICD-10-CM | POA: Diagnosis not present

## 2023-10-04 DIAGNOSIS — D631 Anemia in chronic kidney disease: Secondary | ICD-10-CM | POA: Diagnosis not present

## 2023-10-04 DIAGNOSIS — E1151 Type 2 diabetes mellitus with diabetic peripheral angiopathy without gangrene: Secondary | ICD-10-CM | POA: Diagnosis not present

## 2023-10-04 DIAGNOSIS — S72142D Displaced intertrochanteric fracture of left femur, subsequent encounter for closed fracture with routine healing: Secondary | ICD-10-CM | POA: Diagnosis not present

## 2023-10-05 DIAGNOSIS — I130 Hypertensive heart and chronic kidney disease with heart failure and stage 1 through stage 4 chronic kidney disease, or unspecified chronic kidney disease: Secondary | ICD-10-CM | POA: Diagnosis not present

## 2023-10-05 DIAGNOSIS — E1122 Type 2 diabetes mellitus with diabetic chronic kidney disease: Secondary | ICD-10-CM | POA: Diagnosis not present

## 2023-10-05 DIAGNOSIS — E1142 Type 2 diabetes mellitus with diabetic polyneuropathy: Secondary | ICD-10-CM | POA: Diagnosis not present

## 2023-10-05 DIAGNOSIS — E1151 Type 2 diabetes mellitus with diabetic peripheral angiopathy without gangrene: Secondary | ICD-10-CM | POA: Diagnosis not present

## 2023-10-05 DIAGNOSIS — I480 Paroxysmal atrial fibrillation: Secondary | ICD-10-CM | POA: Diagnosis not present

## 2023-10-05 DIAGNOSIS — D631 Anemia in chronic kidney disease: Secondary | ICD-10-CM | POA: Diagnosis not present

## 2023-10-05 DIAGNOSIS — S72142D Displaced intertrochanteric fracture of left femur, subsequent encounter for closed fracture with routine healing: Secondary | ICD-10-CM | POA: Diagnosis not present

## 2023-10-05 DIAGNOSIS — N183 Chronic kidney disease, stage 3 unspecified: Secondary | ICD-10-CM | POA: Diagnosis not present

## 2023-10-05 DIAGNOSIS — I5032 Chronic diastolic (congestive) heart failure: Secondary | ICD-10-CM | POA: Diagnosis not present

## 2023-10-08 DIAGNOSIS — E113293 Type 2 diabetes mellitus with mild nonproliferative diabetic retinopathy without macular edema, bilateral: Secondary | ICD-10-CM | POA: Diagnosis not present

## 2023-10-08 DIAGNOSIS — H1045 Other chronic allergic conjunctivitis: Secondary | ICD-10-CM | POA: Diagnosis not present

## 2023-10-08 DIAGNOSIS — H0102B Squamous blepharitis left eye, upper and lower eyelids: Secondary | ICD-10-CM | POA: Diagnosis not present

## 2023-10-08 DIAGNOSIS — H04123 Dry eye syndrome of bilateral lacrimal glands: Secondary | ICD-10-CM | POA: Diagnosis not present

## 2023-10-08 DIAGNOSIS — H353131 Nonexudative age-related macular degeneration, bilateral, early dry stage: Secondary | ICD-10-CM | POA: Diagnosis not present

## 2023-10-08 DIAGNOSIS — H0102A Squamous blepharitis right eye, upper and lower eyelids: Secondary | ICD-10-CM | POA: Diagnosis not present

## 2023-10-08 DIAGNOSIS — Z961 Presence of intraocular lens: Secondary | ICD-10-CM | POA: Diagnosis not present

## 2023-10-09 DIAGNOSIS — I130 Hypertensive heart and chronic kidney disease with heart failure and stage 1 through stage 4 chronic kidney disease, or unspecified chronic kidney disease: Secondary | ICD-10-CM | POA: Diagnosis not present

## 2023-10-09 DIAGNOSIS — I5032 Chronic diastolic (congestive) heart failure: Secondary | ICD-10-CM | POA: Diagnosis not present

## 2023-10-09 DIAGNOSIS — E1122 Type 2 diabetes mellitus with diabetic chronic kidney disease: Secondary | ICD-10-CM | POA: Diagnosis not present

## 2023-10-09 DIAGNOSIS — S72142D Displaced intertrochanteric fracture of left femur, subsequent encounter for closed fracture with routine healing: Secondary | ICD-10-CM | POA: Diagnosis not present

## 2023-10-09 DIAGNOSIS — D631 Anemia in chronic kidney disease: Secondary | ICD-10-CM | POA: Diagnosis not present

## 2023-10-09 DIAGNOSIS — E1151 Type 2 diabetes mellitus with diabetic peripheral angiopathy without gangrene: Secondary | ICD-10-CM | POA: Diagnosis not present

## 2023-10-09 DIAGNOSIS — E1142 Type 2 diabetes mellitus with diabetic polyneuropathy: Secondary | ICD-10-CM | POA: Diagnosis not present

## 2023-10-09 DIAGNOSIS — I480 Paroxysmal atrial fibrillation: Secondary | ICD-10-CM | POA: Diagnosis not present

## 2023-10-09 DIAGNOSIS — N183 Chronic kidney disease, stage 3 unspecified: Secondary | ICD-10-CM | POA: Diagnosis not present

## 2023-10-10 DIAGNOSIS — S72142D Displaced intertrochanteric fracture of left femur, subsequent encounter for closed fracture with routine healing: Secondary | ICD-10-CM | POA: Diagnosis not present

## 2023-10-10 DIAGNOSIS — D631 Anemia in chronic kidney disease: Secondary | ICD-10-CM | POA: Diagnosis not present

## 2023-10-10 DIAGNOSIS — I130 Hypertensive heart and chronic kidney disease with heart failure and stage 1 through stage 4 chronic kidney disease, or unspecified chronic kidney disease: Secondary | ICD-10-CM | POA: Diagnosis not present

## 2023-10-10 DIAGNOSIS — E1142 Type 2 diabetes mellitus with diabetic polyneuropathy: Secondary | ICD-10-CM | POA: Diagnosis not present

## 2023-10-10 DIAGNOSIS — I480 Paroxysmal atrial fibrillation: Secondary | ICD-10-CM | POA: Diagnosis not present

## 2023-10-10 DIAGNOSIS — E1151 Type 2 diabetes mellitus with diabetic peripheral angiopathy without gangrene: Secondary | ICD-10-CM | POA: Diagnosis not present

## 2023-10-10 DIAGNOSIS — N183 Chronic kidney disease, stage 3 unspecified: Secondary | ICD-10-CM | POA: Diagnosis not present

## 2023-10-10 DIAGNOSIS — E1122 Type 2 diabetes mellitus with diabetic chronic kidney disease: Secondary | ICD-10-CM | POA: Diagnosis not present

## 2023-10-10 DIAGNOSIS — I5032 Chronic diastolic (congestive) heart failure: Secondary | ICD-10-CM | POA: Diagnosis not present

## 2023-10-11 DIAGNOSIS — H6123 Impacted cerumen, bilateral: Secondary | ICD-10-CM | POA: Diagnosis not present

## 2023-10-12 DIAGNOSIS — E1142 Type 2 diabetes mellitus with diabetic polyneuropathy: Secondary | ICD-10-CM | POA: Diagnosis not present

## 2023-10-12 DIAGNOSIS — I5032 Chronic diastolic (congestive) heart failure: Secondary | ICD-10-CM | POA: Diagnosis not present

## 2023-10-12 DIAGNOSIS — S72142D Displaced intertrochanteric fracture of left femur, subsequent encounter for closed fracture with routine healing: Secondary | ICD-10-CM | POA: Diagnosis not present

## 2023-10-12 DIAGNOSIS — E1122 Type 2 diabetes mellitus with diabetic chronic kidney disease: Secondary | ICD-10-CM | POA: Diagnosis not present

## 2023-10-12 DIAGNOSIS — N183 Chronic kidney disease, stage 3 unspecified: Secondary | ICD-10-CM | POA: Diagnosis not present

## 2023-10-12 DIAGNOSIS — D631 Anemia in chronic kidney disease: Secondary | ICD-10-CM | POA: Diagnosis not present

## 2023-10-12 DIAGNOSIS — I480 Paroxysmal atrial fibrillation: Secondary | ICD-10-CM | POA: Diagnosis not present

## 2023-10-12 DIAGNOSIS — I130 Hypertensive heart and chronic kidney disease with heart failure and stage 1 through stage 4 chronic kidney disease, or unspecified chronic kidney disease: Secondary | ICD-10-CM | POA: Diagnosis not present

## 2023-10-12 DIAGNOSIS — E1151 Type 2 diabetes mellitus with diabetic peripheral angiopathy without gangrene: Secondary | ICD-10-CM | POA: Diagnosis not present

## 2023-10-15 DIAGNOSIS — E1122 Type 2 diabetes mellitus with diabetic chronic kidney disease: Secondary | ICD-10-CM | POA: Diagnosis not present

## 2023-10-15 DIAGNOSIS — E1142 Type 2 diabetes mellitus with diabetic polyneuropathy: Secondary | ICD-10-CM | POA: Diagnosis not present

## 2023-10-15 DIAGNOSIS — S72142D Displaced intertrochanteric fracture of left femur, subsequent encounter for closed fracture with routine healing: Secondary | ICD-10-CM | POA: Diagnosis not present

## 2023-10-15 DIAGNOSIS — I130 Hypertensive heart and chronic kidney disease with heart failure and stage 1 through stage 4 chronic kidney disease, or unspecified chronic kidney disease: Secondary | ICD-10-CM | POA: Diagnosis not present

## 2023-10-15 DIAGNOSIS — N183 Chronic kidney disease, stage 3 unspecified: Secondary | ICD-10-CM | POA: Diagnosis not present

## 2023-10-15 DIAGNOSIS — D631 Anemia in chronic kidney disease: Secondary | ICD-10-CM | POA: Diagnosis not present

## 2023-10-15 DIAGNOSIS — I5032 Chronic diastolic (congestive) heart failure: Secondary | ICD-10-CM | POA: Diagnosis not present

## 2023-10-15 DIAGNOSIS — I480 Paroxysmal atrial fibrillation: Secondary | ICD-10-CM | POA: Diagnosis not present

## 2023-10-15 DIAGNOSIS — E1151 Type 2 diabetes mellitus with diabetic peripheral angiopathy without gangrene: Secondary | ICD-10-CM | POA: Diagnosis not present

## 2023-10-17 DIAGNOSIS — E1122 Type 2 diabetes mellitus with diabetic chronic kidney disease: Secondary | ICD-10-CM | POA: Diagnosis not present

## 2023-10-17 DIAGNOSIS — D631 Anemia in chronic kidney disease: Secondary | ICD-10-CM | POA: Diagnosis not present

## 2023-10-17 DIAGNOSIS — S72142D Displaced intertrochanteric fracture of left femur, subsequent encounter for closed fracture with routine healing: Secondary | ICD-10-CM | POA: Diagnosis not present

## 2023-10-17 DIAGNOSIS — I5032 Chronic diastolic (congestive) heart failure: Secondary | ICD-10-CM | POA: Diagnosis not present

## 2023-10-17 DIAGNOSIS — E1151 Type 2 diabetes mellitus with diabetic peripheral angiopathy without gangrene: Secondary | ICD-10-CM | POA: Diagnosis not present

## 2023-10-17 DIAGNOSIS — E1142 Type 2 diabetes mellitus with diabetic polyneuropathy: Secondary | ICD-10-CM | POA: Diagnosis not present

## 2023-10-17 DIAGNOSIS — I480 Paroxysmal atrial fibrillation: Secondary | ICD-10-CM | POA: Diagnosis not present

## 2023-10-17 DIAGNOSIS — I130 Hypertensive heart and chronic kidney disease with heart failure and stage 1 through stage 4 chronic kidney disease, or unspecified chronic kidney disease: Secondary | ICD-10-CM | POA: Diagnosis not present

## 2023-10-17 DIAGNOSIS — N183 Chronic kidney disease, stage 3 unspecified: Secondary | ICD-10-CM | POA: Diagnosis not present

## 2023-10-19 DIAGNOSIS — I5032 Chronic diastolic (congestive) heart failure: Secondary | ICD-10-CM | POA: Diagnosis not present

## 2023-10-19 DIAGNOSIS — S72142D Displaced intertrochanteric fracture of left femur, subsequent encounter for closed fracture with routine healing: Secondary | ICD-10-CM | POA: Diagnosis not present

## 2023-10-19 DIAGNOSIS — D631 Anemia in chronic kidney disease: Secondary | ICD-10-CM | POA: Diagnosis not present

## 2023-10-19 DIAGNOSIS — E1142 Type 2 diabetes mellitus with diabetic polyneuropathy: Secondary | ICD-10-CM | POA: Diagnosis not present

## 2023-10-19 DIAGNOSIS — I130 Hypertensive heart and chronic kidney disease with heart failure and stage 1 through stage 4 chronic kidney disease, or unspecified chronic kidney disease: Secondary | ICD-10-CM | POA: Diagnosis not present

## 2023-10-19 DIAGNOSIS — N183 Chronic kidney disease, stage 3 unspecified: Secondary | ICD-10-CM | POA: Diagnosis not present

## 2023-10-19 DIAGNOSIS — E1122 Type 2 diabetes mellitus with diabetic chronic kidney disease: Secondary | ICD-10-CM | POA: Diagnosis not present

## 2023-10-19 DIAGNOSIS — I480 Paroxysmal atrial fibrillation: Secondary | ICD-10-CM | POA: Diagnosis not present

## 2023-10-19 DIAGNOSIS — E1151 Type 2 diabetes mellitus with diabetic peripheral angiopathy without gangrene: Secondary | ICD-10-CM | POA: Diagnosis not present

## 2023-10-22 DIAGNOSIS — E1142 Type 2 diabetes mellitus with diabetic polyneuropathy: Secondary | ICD-10-CM | POA: Diagnosis not present

## 2023-10-23 DIAGNOSIS — S72142D Displaced intertrochanteric fracture of left femur, subsequent encounter for closed fracture with routine healing: Secondary | ICD-10-CM | POA: Diagnosis not present

## 2023-10-23 DIAGNOSIS — I5032 Chronic diastolic (congestive) heart failure: Secondary | ICD-10-CM | POA: Diagnosis not present

## 2023-10-23 DIAGNOSIS — I130 Hypertensive heart and chronic kidney disease with heart failure and stage 1 through stage 4 chronic kidney disease, or unspecified chronic kidney disease: Secondary | ICD-10-CM | POA: Diagnosis not present

## 2023-10-23 DIAGNOSIS — I480 Paroxysmal atrial fibrillation: Secondary | ICD-10-CM | POA: Diagnosis not present

## 2023-10-23 DIAGNOSIS — N183 Chronic kidney disease, stage 3 unspecified: Secondary | ICD-10-CM | POA: Diagnosis not present

## 2023-10-23 DIAGNOSIS — E1122 Type 2 diabetes mellitus with diabetic chronic kidney disease: Secondary | ICD-10-CM | POA: Diagnosis not present

## 2023-10-23 DIAGNOSIS — E1142 Type 2 diabetes mellitus with diabetic polyneuropathy: Secondary | ICD-10-CM | POA: Diagnosis not present

## 2023-10-23 DIAGNOSIS — E1151 Type 2 diabetes mellitus with diabetic peripheral angiopathy without gangrene: Secondary | ICD-10-CM | POA: Diagnosis not present

## 2023-10-23 DIAGNOSIS — D631 Anemia in chronic kidney disease: Secondary | ICD-10-CM | POA: Diagnosis not present

## 2023-10-25 DIAGNOSIS — E1142 Type 2 diabetes mellitus with diabetic polyneuropathy: Secondary | ICD-10-CM | POA: Diagnosis not present

## 2023-10-25 DIAGNOSIS — M7989 Other specified soft tissue disorders: Secondary | ICD-10-CM | POA: Diagnosis not present

## 2023-10-25 DIAGNOSIS — I480 Paroxysmal atrial fibrillation: Secondary | ICD-10-CM | POA: Diagnosis not present

## 2023-10-25 DIAGNOSIS — M810 Age-related osteoporosis without current pathological fracture: Secondary | ICD-10-CM | POA: Diagnosis not present

## 2023-10-25 DIAGNOSIS — I10 Essential (primary) hypertension: Secondary | ICD-10-CM | POA: Diagnosis not present

## 2023-10-25 DIAGNOSIS — I5032 Chronic diastolic (congestive) heart failure: Secondary | ICD-10-CM | POA: Diagnosis not present

## 2023-10-30 DIAGNOSIS — S72142D Displaced intertrochanteric fracture of left femur, subsequent encounter for closed fracture with routine healing: Secondary | ICD-10-CM | POA: Diagnosis not present

## 2023-10-31 DIAGNOSIS — I130 Hypertensive heart and chronic kidney disease with heart failure and stage 1 through stage 4 chronic kidney disease, or unspecified chronic kidney disease: Secondary | ICD-10-CM | POA: Diagnosis not present

## 2023-10-31 DIAGNOSIS — E1151 Type 2 diabetes mellitus with diabetic peripheral angiopathy without gangrene: Secondary | ICD-10-CM | POA: Diagnosis not present

## 2023-10-31 DIAGNOSIS — E1122 Type 2 diabetes mellitus with diabetic chronic kidney disease: Secondary | ICD-10-CM | POA: Diagnosis not present

## 2023-10-31 DIAGNOSIS — D631 Anemia in chronic kidney disease: Secondary | ICD-10-CM | POA: Diagnosis not present

## 2023-10-31 DIAGNOSIS — E1142 Type 2 diabetes mellitus with diabetic polyneuropathy: Secondary | ICD-10-CM | POA: Diagnosis not present

## 2023-10-31 DIAGNOSIS — N183 Chronic kidney disease, stage 3 unspecified: Secondary | ICD-10-CM | POA: Diagnosis not present

## 2023-10-31 DIAGNOSIS — I5032 Chronic diastolic (congestive) heart failure: Secondary | ICD-10-CM | POA: Diagnosis not present

## 2023-10-31 DIAGNOSIS — I480 Paroxysmal atrial fibrillation: Secondary | ICD-10-CM | POA: Diagnosis not present

## 2023-10-31 DIAGNOSIS — S72142D Displaced intertrochanteric fracture of left femur, subsequent encounter for closed fracture with routine healing: Secondary | ICD-10-CM | POA: Diagnosis not present

## 2023-11-05 DIAGNOSIS — Z860101 Personal history of adenomatous and serrated colon polyps: Secondary | ICD-10-CM | POA: Diagnosis not present

## 2023-11-05 DIAGNOSIS — R197 Diarrhea, unspecified: Secondary | ICD-10-CM | POA: Diagnosis not present

## 2023-11-05 DIAGNOSIS — K5900 Constipation, unspecified: Secondary | ICD-10-CM | POA: Diagnosis not present

## 2023-11-06 DIAGNOSIS — I5032 Chronic diastolic (congestive) heart failure: Secondary | ICD-10-CM | POA: Diagnosis not present

## 2023-11-06 DIAGNOSIS — D631 Anemia in chronic kidney disease: Secondary | ICD-10-CM | POA: Diagnosis not present

## 2023-11-06 DIAGNOSIS — E1151 Type 2 diabetes mellitus with diabetic peripheral angiopathy without gangrene: Secondary | ICD-10-CM | POA: Diagnosis not present

## 2023-11-06 DIAGNOSIS — E1142 Type 2 diabetes mellitus with diabetic polyneuropathy: Secondary | ICD-10-CM | POA: Diagnosis not present

## 2023-11-06 DIAGNOSIS — I130 Hypertensive heart and chronic kidney disease with heart failure and stage 1 through stage 4 chronic kidney disease, or unspecified chronic kidney disease: Secondary | ICD-10-CM | POA: Diagnosis not present

## 2023-11-06 DIAGNOSIS — I480 Paroxysmal atrial fibrillation: Secondary | ICD-10-CM | POA: Diagnosis not present

## 2023-11-06 DIAGNOSIS — E1122 Type 2 diabetes mellitus with diabetic chronic kidney disease: Secondary | ICD-10-CM | POA: Diagnosis not present

## 2023-11-06 DIAGNOSIS — N183 Chronic kidney disease, stage 3 unspecified: Secondary | ICD-10-CM | POA: Diagnosis not present

## 2023-11-06 DIAGNOSIS — S72142D Displaced intertrochanteric fracture of left femur, subsequent encounter for closed fracture with routine healing: Secondary | ICD-10-CM | POA: Diagnosis not present

## 2023-11-09 DIAGNOSIS — R220 Localized swelling, mass and lump, head: Secondary | ICD-10-CM | POA: Diagnosis not present

## 2023-11-09 DIAGNOSIS — S0990XA Unspecified injury of head, initial encounter: Secondary | ICD-10-CM | POA: Diagnosis not present

## 2023-11-09 DIAGNOSIS — W19XXXA Unspecified fall, initial encounter: Secondary | ICD-10-CM | POA: Diagnosis not present

## 2023-11-09 DIAGNOSIS — S0003XA Contusion of scalp, initial encounter: Secondary | ICD-10-CM | POA: Diagnosis not present

## 2023-11-14 DIAGNOSIS — I5032 Chronic diastolic (congestive) heart failure: Secondary | ICD-10-CM | POA: Diagnosis not present

## 2023-11-14 DIAGNOSIS — I130 Hypertensive heart and chronic kidney disease with heart failure and stage 1 through stage 4 chronic kidney disease, or unspecified chronic kidney disease: Secondary | ICD-10-CM | POA: Diagnosis not present

## 2023-11-14 DIAGNOSIS — E1142 Type 2 diabetes mellitus with diabetic polyneuropathy: Secondary | ICD-10-CM | POA: Diagnosis not present

## 2023-11-14 DIAGNOSIS — S72142D Displaced intertrochanteric fracture of left femur, subsequent encounter for closed fracture with routine healing: Secondary | ICD-10-CM | POA: Diagnosis not present

## 2023-11-14 DIAGNOSIS — N183 Chronic kidney disease, stage 3 unspecified: Secondary | ICD-10-CM | POA: Diagnosis not present

## 2023-11-14 DIAGNOSIS — D631 Anemia in chronic kidney disease: Secondary | ICD-10-CM | POA: Diagnosis not present

## 2023-11-14 DIAGNOSIS — I480 Paroxysmal atrial fibrillation: Secondary | ICD-10-CM | POA: Diagnosis not present

## 2023-11-14 DIAGNOSIS — E1151 Type 2 diabetes mellitus with diabetic peripheral angiopathy without gangrene: Secondary | ICD-10-CM | POA: Diagnosis not present

## 2023-11-14 DIAGNOSIS — E1122 Type 2 diabetes mellitus with diabetic chronic kidney disease: Secondary | ICD-10-CM | POA: Diagnosis not present

## 2023-12-20 ENCOUNTER — Encounter: Payer: Self-pay | Admitting: Cardiology

## 2023-12-20 ENCOUNTER — Ambulatory Visit: Payer: Medicare PPO | Attending: Cardiology | Admitting: Cardiology

## 2023-12-20 VITALS — BP 142/78 | HR 88 | Ht 64.0 in | Wt 134.0 lb

## 2023-12-20 DIAGNOSIS — Z95818 Presence of other cardiac implants and grafts: Secondary | ICD-10-CM | POA: Diagnosis not present

## 2023-12-20 DIAGNOSIS — Z8719 Personal history of other diseases of the digestive system: Secondary | ICD-10-CM

## 2023-12-20 DIAGNOSIS — I480 Paroxysmal atrial fibrillation: Secondary | ICD-10-CM

## 2023-12-20 DIAGNOSIS — I5033 Acute on chronic diastolic (congestive) heart failure: Secondary | ICD-10-CM | POA: Diagnosis not present

## 2023-12-20 MED ORDER — CARVEDILOL 25 MG PO TABS: 25.0000 mg | ORAL_TABLET | Freq: Two times a day (BID) | ORAL | 3 refills | Status: AC

## 2023-12-20 NOTE — Progress Notes (Signed)
 Cardiology Office Note:  .   Date:  12/20/2023  ID:  [name], DOB 11-21-1940, MRN [id number] PCP: Benedetta Bradley, MD  [facility] Providers Cardiologist:  Dorothye Gathers, MD Electrophysiologist:  Boyce Byes, MD     History of Present Illness: .   [name] is a 83 y.o. female Discussed the use of AI scribe software for clinical note transcription with the patient, who gave verbal consent to proceed.  History of Present Illness [name] is an 83 year old female with paroxysmal atrial fibrillation who presents for follow-up.  She has been experiencing episodes of high blood pressure, with a recent reading of 206/90 mmHg that led to a hospital visit. At the hospital, immediate treatment was not initiated to avoid a rapid drop in blood pressure. She is currently managed with irbesartan 300 mg daily. She has not needed additional medication since the adjustment.  She has a history of paroxysmal atrial fibrillation and cannot tolerate anticoagulation due to previous gastrointestinal bleeds. She is on aspirin  81 mg and has a Watchman device following left atrial appendage closure. An echocardiogram in 2022 showed a normal ejection fraction. Her heart rate has been elevated at 91 bpm, compared to previous rates of 57-67 bpm when on carvedilol , which she has not been taking recently. There is uncertainty about whether she should resume carvedilol  alongside her current medications.  She feels hot most of the time, describing herself as 'burning up'. She recalls a previous episode of heart failure when she was hospitalized, during which she felt excessively hot, requiring others to wear coats around her.  Her current medications include irbesartan 300 mg daily and aspirin  81 mg. She was previously on carvedilol  25 mg twice a day but has not been taking it recently. There is confusion regarding whether she is taking metoprolol , as it appears on her medication list,  but she does not recall taking it.   Studies Reviewed: .        Results DIAGNOSTIC Echocardiography: Normal ejection fraction (2022) EKG: Normal rhythm (05/2023) Risk Assessment/Calculations:           Physical Exam:   VS:  BP (!) 142/78   Pulse 88   Ht 5\' 4"  (1.626 m)   Wt 134 lb (60.8 kg)   SpO2 97%   BMI 23.00 kg/m    Wt Readings from Last 3 Encounters:  12/20/23 134 lb (60.8 kg)  06/28/23 134 lb (60.8 kg)  06/05/23 136 lb (61.7 kg)    GEN: Well nourished, well developed in no acute distress NECK: No JVD; No carotid bruits CARDIAC: RRR, no murmurs, no rubs, no gallops RESPIRATORY:  Clear to auscultation without rales, wheezing or rhonchi  ABDOMEN: Soft, non-tender, non-distended EXTREMITIES:  No edema; No deformity   ASSESSMENT AND PLAN: .    Assessment and Plan Assessment & Plan Paroxysmal Atrial Fibrillation Paroxysmal atrial fibrillation with current heart rate of 91 bpm, elevated from previous readings of 57-67 bpm. Carvedilol  was previously prescribed but not currently taken. Current rhythm is regular but fast. Carvedilol  may stabilize heart rate from 80's down to 60's and reduce hot sensations. - Restart carvedilol  25 mg twice daily. - Ensure carvedilol  is added to the medication list. - Discontinue metoprolol  if currently taking.  Heart Failure with Preserved Ejection Fraction (HFpEF) Heart failure with preserved ejection fraction. No acute symptoms of heart failure. Reports feeling hot, possibly related to heart rate or hormonal changes. - Monitor symptoms and adjust medications  as needed.  Hypertension Hypertension with recent episode of blood pressure 206/90 mmHg leading to hospital visit. Currently on irbesartan 300 mg daily (by Dr. Teofilo Fellers). Blood pressure today is 142/78 mmHg. Carvedilol  may assist in further controlling blood pressure. - Continue irbesartan 300 mg daily. - Restart carvedilol  25 mg twice daily.  History of GI  Bleed Gastrointestinal bleed, unable to tolerate anticoagulation. Currently on aspirin  81 mg daily. No new bleeding events reported. - Continue aspirin  81 mg daily.   Follow-up Annual follow-up recommended to monitor cardiovascular health and medication management. If unable to schedule with the doctor, consider follow-up with PA or NP. - Schedule follow-up appointment in one year.           Signed, Dorothye Gathers, MD

## 2023-12-20 NOTE — Patient Instructions (Signed)
 Medication Instructions:  Please discontinue Metoprolol  and start Carvedilol  25 mg twice a day. Continue all other medications as listed.  *If you need a refill on your cardiac medications before your next appointment, please call your pharmacy*  Follow-Up: At [facility], you and your health needs are our priority.  As part of our continuing mission to provide you with exceptional heart care, our providers are all part of one team.  This team includes your primary Cardiologist (physician) and Advanced Practice Providers or APPs (Physician Assistants and Nurse Practitioners) who all work together to provide you with the care you need, when you need it.  Your next appointment:   1 year(s)  Provider:   Dorothye Gathers, MD    We recommend signing up for the patient portal called "MyChart".  Sign up information is provided on this After Visit Summary.  MyChart is used to connect with patients for Virtual Visits (Telemedicine).  Patients are able to view lab/test results, encounter notes, upcoming appointments, etc.  Non-urgent messages can be sent to your provider as well.   To learn more about what you can do with MyChart, go to [email or web address].         1st Floor: - Lobby - Registration  - Pharmacy  - Lab - Cafe  2nd Floor: - PV Lab - Diagnostic Testing (echo, CT, nuclear med)  3rd Floor: - Vacant  4th Floor: - TCTS (cardiothoracic surgery) - AFib Clinic - Structural Heart Clinic - Vascular Surgery  - Vascular Ultrasound  5th Floor: - HeartCare Cardiology (general and EP) - Clinical Pharmacy for coumadin, hypertension, lipid, weight-loss medications, and med management appointments    Valet parking services will be available as well.

## 2023-12-28 DIAGNOSIS — K5730 Diverticulosis of large intestine without perforation or abscess without bleeding: Secondary | ICD-10-CM | POA: Diagnosis not present

## 2023-12-28 DIAGNOSIS — Z09 Encounter for follow-up examination after completed treatment for conditions other than malignant neoplasm: Secondary | ICD-10-CM | POA: Diagnosis not present

## 2023-12-28 DIAGNOSIS — Z860101 Personal history of adenomatous and serrated colon polyps: Secondary | ICD-10-CM | POA: Diagnosis not present

## 2024-01-01 DIAGNOSIS — I509 Heart failure, unspecified: Secondary | ICD-10-CM | POA: Diagnosis not present

## 2024-01-01 DIAGNOSIS — Z794 Long term (current) use of insulin: Secondary | ICD-10-CM | POA: Diagnosis not present

## 2024-01-01 DIAGNOSIS — E1142 Type 2 diabetes mellitus with diabetic polyneuropathy: Secondary | ICD-10-CM | POA: Diagnosis not present

## 2024-01-05 ENCOUNTER — Other Ambulatory Visit: Payer: Self-pay | Admitting: Cardiology

## 2024-01-10 ENCOUNTER — Other Ambulatory Visit: Payer: Self-pay | Admitting: Internal Medicine

## 2024-01-10 DIAGNOSIS — Z1231 Encounter for screening mammogram for malignant neoplasm of breast: Secondary | ICD-10-CM

## 2024-01-17 ENCOUNTER — Ambulatory Visit
Admission: RE | Admit: 2024-01-17 | Discharge: 2024-01-17 | Disposition: A | Discharge status: Home / Self Care | Source: Ambulatory Visit | Attending: Internal Medicine | Admitting: Internal Medicine

## 2024-01-17 DIAGNOSIS — Z1231 Encounter for screening mammogram for malignant neoplasm of breast: Secondary | ICD-10-CM

## 2024-01-20 DIAGNOSIS — E1142 Type 2 diabetes mellitus with diabetic polyneuropathy: Secondary | ICD-10-CM | POA: Diagnosis not present

## 2024-02-19 DIAGNOSIS — Z96652 Presence of left artificial knee joint: Secondary | ICD-10-CM | POA: Diagnosis not present

## 2024-02-19 DIAGNOSIS — M25562 Pain in left knee: Secondary | ICD-10-CM | POA: Diagnosis not present

## 2024-02-19 DIAGNOSIS — S8992XA Unspecified injury of left lower leg, initial encounter: Secondary | ICD-10-CM | POA: Diagnosis not present

## 2024-02-23 DIAGNOSIS — S81802A Unspecified open wound, left lower leg, initial encounter: Secondary | ICD-10-CM | POA: Diagnosis not present

## 2024-02-23 DIAGNOSIS — S6991XA Unspecified injury of right wrist, hand and finger(s), initial encounter: Secondary | ICD-10-CM | POA: Diagnosis not present

## 2024-02-23 DIAGNOSIS — L03116 Cellulitis of left lower limb: Secondary | ICD-10-CM | POA: Diagnosis not present

## 2024-02-23 DIAGNOSIS — W230XXA Caught, crushed, jammed, or pinched between moving objects, initial encounter: Secondary | ICD-10-CM | POA: Diagnosis not present

## 2024-02-23 DIAGNOSIS — S60221A Contusion of right hand, initial encounter: Secondary | ICD-10-CM | POA: Diagnosis not present

## 2024-03-12 DIAGNOSIS — Z79899 Other long term (current) drug therapy: Secondary | ICD-10-CM | POA: Diagnosis not present

## 2024-03-12 DIAGNOSIS — Z794 Long term (current) use of insulin: Secondary | ICD-10-CM | POA: Diagnosis not present

## 2024-03-12 DIAGNOSIS — Z1331 Encounter for screening for depression: Secondary | ICD-10-CM | POA: Diagnosis not present

## 2024-03-12 DIAGNOSIS — E559 Vitamin D deficiency, unspecified: Secondary | ICD-10-CM | POA: Diagnosis not present

## 2024-03-12 DIAGNOSIS — M7989 Other specified soft tissue disorders: Secondary | ICD-10-CM | POA: Diagnosis not present

## 2024-03-12 DIAGNOSIS — I5032 Chronic diastolic (congestive) heart failure: Secondary | ICD-10-CM | POA: Diagnosis not present

## 2024-03-12 DIAGNOSIS — E1142 Type 2 diabetes mellitus with diabetic polyneuropathy: Secondary | ICD-10-CM | POA: Diagnosis not present

## 2024-03-12 DIAGNOSIS — M810 Age-related osteoporosis without current pathological fracture: Secondary | ICD-10-CM | POA: Diagnosis not present

## 2024-03-12 DIAGNOSIS — I480 Paroxysmal atrial fibrillation: Secondary | ICD-10-CM | POA: Diagnosis not present

## 2024-03-12 DIAGNOSIS — I10 Essential (primary) hypertension: Secondary | ICD-10-CM | POA: Diagnosis not present

## 2024-03-12 DIAGNOSIS — I509 Heart failure, unspecified: Secondary | ICD-10-CM | POA: Diagnosis not present

## 2024-03-12 DIAGNOSIS — Z0000 Encounter for general adult medical examination without abnormal findings: Secondary | ICD-10-CM | POA: Diagnosis not present

## 2024-03-12 DIAGNOSIS — S8992XA Unspecified injury of left lower leg, initial encounter: Secondary | ICD-10-CM | POA: Diagnosis not present

## 2024-03-20 DIAGNOSIS — N289 Disorder of kidney and ureter, unspecified: Secondary | ICD-10-CM | POA: Diagnosis not present

## 2024-04-07 DIAGNOSIS — M6281 Muscle weakness (generalized): Secondary | ICD-10-CM | POA: Diagnosis not present

## 2024-04-07 DIAGNOSIS — R2689 Other abnormalities of gait and mobility: Secondary | ICD-10-CM | POA: Diagnosis not present

## 2024-04-07 DIAGNOSIS — R2681 Unsteadiness on feet: Secondary | ICD-10-CM | POA: Diagnosis not present

## 2024-04-09 DIAGNOSIS — N9089 Other specified noninflammatory disorders of vulva and perineum: Secondary | ICD-10-CM | POA: Diagnosis not present

## 2024-04-17 DIAGNOSIS — R2689 Other abnormalities of gait and mobility: Secondary | ICD-10-CM | POA: Diagnosis not present

## 2024-04-17 DIAGNOSIS — M6281 Muscle weakness (generalized): Secondary | ICD-10-CM | POA: Diagnosis not present

## 2024-04-17 DIAGNOSIS — R2681 Unsteadiness on feet: Secondary | ICD-10-CM | POA: Diagnosis not present

## 2024-04-19 DIAGNOSIS — E1142 Type 2 diabetes mellitus with diabetic polyneuropathy: Secondary | ICD-10-CM | POA: Diagnosis not present

## 2024-04-23 DIAGNOSIS — M6281 Muscle weakness (generalized): Secondary | ICD-10-CM | POA: Diagnosis not present

## 2024-04-23 DIAGNOSIS — R2681 Unsteadiness on feet: Secondary | ICD-10-CM | POA: Diagnosis not present

## 2024-04-23 DIAGNOSIS — R2689 Other abnormalities of gait and mobility: Secondary | ICD-10-CM | POA: Diagnosis not present

## 2024-05-01 DIAGNOSIS — M6281 Muscle weakness (generalized): Secondary | ICD-10-CM | POA: Diagnosis not present

## 2024-05-01 DIAGNOSIS — R2681 Unsteadiness on feet: Secondary | ICD-10-CM | POA: Diagnosis not present

## 2024-05-01 DIAGNOSIS — R2689 Other abnormalities of gait and mobility: Secondary | ICD-10-CM | POA: Diagnosis not present

## 2024-05-05 DIAGNOSIS — M6281 Muscle weakness (generalized): Secondary | ICD-10-CM | POA: Diagnosis not present

## 2024-05-05 DIAGNOSIS — R2689 Other abnormalities of gait and mobility: Secondary | ICD-10-CM | POA: Diagnosis not present

## 2024-05-05 DIAGNOSIS — R2681 Unsteadiness on feet: Secondary | ICD-10-CM | POA: Diagnosis not present

## 2024-05-06 DIAGNOSIS — Z974 Presence of external hearing-aid: Secondary | ICD-10-CM | POA: Diagnosis not present

## 2024-05-06 DIAGNOSIS — H903 Sensorineural hearing loss, bilateral: Secondary | ICD-10-CM | POA: Diagnosis not present

## 2024-05-21 DIAGNOSIS — R2689 Other abnormalities of gait and mobility: Secondary | ICD-10-CM | POA: Diagnosis not present

## 2024-05-21 DIAGNOSIS — R2681 Unsteadiness on feet: Secondary | ICD-10-CM | POA: Diagnosis not present

## 2024-05-21 DIAGNOSIS — M6281 Muscle weakness (generalized): Secondary | ICD-10-CM | POA: Diagnosis not present

## 2024-05-30 DIAGNOSIS — R2689 Other abnormalities of gait and mobility: Secondary | ICD-10-CM | POA: Diagnosis not present

## 2024-05-30 DIAGNOSIS — R2681 Unsteadiness on feet: Secondary | ICD-10-CM | POA: Diagnosis not present

## 2024-05-30 DIAGNOSIS — M6281 Muscle weakness (generalized): Secondary | ICD-10-CM | POA: Diagnosis not present

## 2024-06-02 DIAGNOSIS — E1142 Type 2 diabetes mellitus with diabetic polyneuropathy: Secondary | ICD-10-CM | POA: Diagnosis not present

## 2024-06-02 DIAGNOSIS — Z794 Long term (current) use of insulin: Secondary | ICD-10-CM | POA: Diagnosis not present

## 2024-06-02 DIAGNOSIS — I4891 Unspecified atrial fibrillation: Secondary | ICD-10-CM | POA: Diagnosis not present

## 2024-06-02 DIAGNOSIS — E1122 Type 2 diabetes mellitus with diabetic chronic kidney disease: Secondary | ICD-10-CM | POA: Diagnosis not present

## 2024-06-02 DIAGNOSIS — I509 Heart failure, unspecified: Secondary | ICD-10-CM | POA: Diagnosis not present

## 2024-06-02 DIAGNOSIS — D6869 Other thrombophilia: Secondary | ICD-10-CM | POA: Diagnosis not present

## 2024-06-02 DIAGNOSIS — I132 Hypertensive heart and chronic kidney disease with heart failure and with stage 5 chronic kidney disease, or end stage renal disease: Secondary | ICD-10-CM | POA: Diagnosis not present

## 2024-06-02 DIAGNOSIS — N185 Chronic kidney disease, stage 5: Secondary | ICD-10-CM | POA: Diagnosis not present

## 2024-06-02 DIAGNOSIS — E1151 Type 2 diabetes mellitus with diabetic peripheral angiopathy without gangrene: Secondary | ICD-10-CM | POA: Diagnosis not present

## 2024-06-06 DIAGNOSIS — M6281 Muscle weakness (generalized): Secondary | ICD-10-CM | POA: Diagnosis not present

## 2024-06-06 DIAGNOSIS — R2681 Unsteadiness on feet: Secondary | ICD-10-CM | POA: Diagnosis not present

## 2024-06-06 DIAGNOSIS — R2689 Other abnormalities of gait and mobility: Secondary | ICD-10-CM | POA: Diagnosis not present

## 2024-06-10 DIAGNOSIS — I509 Heart failure, unspecified: Secondary | ICD-10-CM | POA: Diagnosis not present

## 2024-06-10 DIAGNOSIS — E1142 Type 2 diabetes mellitus with diabetic polyneuropathy: Secondary | ICD-10-CM | POA: Diagnosis not present

## 2024-06-10 DIAGNOSIS — Z794 Long term (current) use of insulin: Secondary | ICD-10-CM | POA: Diagnosis not present

## 2024-06-13 DIAGNOSIS — R2689 Other abnormalities of gait and mobility: Secondary | ICD-10-CM | POA: Diagnosis not present

## 2024-06-13 DIAGNOSIS — R2681 Unsteadiness on feet: Secondary | ICD-10-CM | POA: Diagnosis not present

## 2024-06-13 DIAGNOSIS — M6281 Muscle weakness (generalized): Secondary | ICD-10-CM | POA: Diagnosis not present

## 2024-06-18 DIAGNOSIS — R2681 Unsteadiness on feet: Secondary | ICD-10-CM | POA: Diagnosis not present

## 2024-06-18 DIAGNOSIS — R2689 Other abnormalities of gait and mobility: Secondary | ICD-10-CM | POA: Diagnosis not present

## 2024-06-18 DIAGNOSIS — M6281 Muscle weakness (generalized): Secondary | ICD-10-CM | POA: Diagnosis not present

## 2024-07-02 ENCOUNTER — Ambulatory Visit: Admitting: Podiatry

## 2024-07-02 DIAGNOSIS — M79675 Pain in left toe(s): Secondary | ICD-10-CM

## 2024-07-02 DIAGNOSIS — E1142 Type 2 diabetes mellitus with diabetic polyneuropathy: Secondary | ICD-10-CM | POA: Diagnosis not present

## 2024-07-02 DIAGNOSIS — B351 Tinea unguium: Secondary | ICD-10-CM

## 2024-07-02 DIAGNOSIS — M79674 Pain in right toe(s): Secondary | ICD-10-CM

## 2024-07-02 MED ORDER — PREGABALIN 75 MG PO CAPS: 75.0000 mg | ORAL_CAPSULE | Freq: Two times a day (BID) | ORAL | 0 refills | Status: DC

## 2024-07-02 NOTE — Progress Notes (Signed)
 Chief Complaint RFC Rfc, no callous but is having issues with neuropathy. She is taking Gabapentin  300mg  2 tab,  BID but states her feet and heels are hurting so bad at night it is waking her up. Left foot started out being worse now they are even. She had a fall a year ago and now has rod in left thigh and hip replacement. She did try capsasisen cream but it burned too much. A1c was 6.7 in Oct ASA    Discussed the use of AI scribe software for clinical note transcription with the patient, who gave verbal consent to proceed.  History of Present Illness [name] is an 83 year old diabetic female with neuropathy who presents with worsening pain and sleep disturbances.  He experiences severe neuropathy pain that has been progressively worsening, particularly affecting her sleep. The pain starts from her heels and radiates up the back of her calves, affecting both legs. Additionally, she experiences curling of his toes at night.  She has been taking gabapentin  300 mg three times a day for several years, which initially provided relief but is no longer effective. She has adjusted his dosing to take two tablets at night to help with sleep, but continues to wake up in pain between 2 and 3 AM. She has not tried other neuropathy medications besides gabapentin . She has used capsaicin cream, but it has not provided relief. She also takes a vitamin supplement.  She has a history of a fall resulting in a broken leg and hip, which has kept her from returning to the podiatrist for about a year. She has not received Qutenza treatments before.  She mentions a past surgery for arthritis in her hands, where a steel bar was placed in her finger to improve function. She also has a history of lower back pain, previously managed with a stimulator that is no longer active.  Her A1c is 6.7, and he has no feeling in her feet. She is on Lasix  and has used compression sleeves, which provide some comfort but do not  alleviate the neuropathy symptoms. She expresses significant distress over her condition, stating 'I have got to do something' and 'I'm just getting my wits in.' She has tried various remedies, including frankincense and myrrh, without success.   Past Medical History:  Diagnosis Date   Abnormal nuclear stress test 06/30/2020   Asthma    Cardiomyopathy, dilated, nonischemic (HCC) 07/09/2020   Comprehensive diabetic foot examination, type 2 DM, encounter for (HCC) 02/17/2020   Depressed left ventricular ejection fraction 06/30/2020   Diabetic polyneuropathy associated with type 2 diabetes mellitus (HCC) 02/17/2020   Dilated cardiomyopathy (HCC) 06/30/2020   ED (erectile dysfunction)    Hypertension    Nonischemic cardiomyopathy (HCC) 07/09/2020   Osteoarthrosis    Right knee   Sleep apnea    Type 2 diabetes mellitus (HCC)    Type 2 diabetes mellitus with complication, without long-term current use of insulin  (HCC) 06/30/2020   Past Surgical History:  Procedure Laterality Date   DRUG INDUCED ENDOSCOPY Bilateral 12/06/2023   Procedure: DRUG INDUCED SLEEP ENDOSCOPY;  Surgeon: Carlie Clark, MD;  Location: [facility] ENDOSCOPY;  Service: ENT;  Laterality: Bilateral;   KNEE SURGERY Right    X 2   LEFT HEART CATH AND CORONARY ANGIOGRAPHY N/A 07/07/2020   Procedure: LEFT HEART CATH AND CORONARY ANGIOGRAPHY;  Surgeon: Anner Alm ORN, MD;  Location: [facility] INVASIVE CV LAB;  Service: Cardiovascular;  Laterality: N/A;   LUNG SURGERY  04/28/1990  Allergies  Allergen Reactions   Shellfish Protein-Containing Drug Products Other (See Comments)    Patient states that his throat starts itching.    Physical Exam Peripheral pulses intact.  Decreased protective sensation, temperature, and vibration in feet.  Negative Tinel's sign with percussion of the posterior tibial nerve bilateral.  Strength 5/5 in lower extremities.  Nails x 10 are 3 mm thick, with subungual debris, distal onycholysis, and pain with compression.   They are all elongated.   Results LABS   Hemoglobin A1c: 6.7%   Assessment/Plan of Care:  Mycotic nails with pain Diabetic peripheral neuropathy  Meds ordered this encounter  Medications   pregabalin (LYRICA) 75 MG capsule    Sig: Take 1 capsule (75 mg total) by mouth 2 (two) times daily. Stop taking the duloxetine when starting this medication    Dispense:  180 capsule    Refill:  0   Assessment & Plan Type 2 diabetes mellitus with painful bilateral lower extremity polyneuropathy Chronic painful bilateral lower extremity polyneuropathy associated with type 2 diabetes mellitus. Symptoms include severe pain from heels to calves and toes curling at night. Gabapentin  300 mg three times daily has lost efficacy. A1c is 6.7, indicating good glycemic control. No prior Qutenza treatments. Compression sleeves provide no significant relief. Differential includes potential lower back issues, but no recent evaluation by neurology or pain management. - Increased gabapentin  to 600 mg at bedtime, not exceeding 600 mg during the day. - Prescribed a Lyrica 75mg  BID for neuropathy, with a 30-day trial period. - Instructed to wean off gabapentin  when starting new medication. - Sent prescription to pharmacy. - Scheduled follow-up appointment in one month. - Sent prescription for compounded medication to [facility], pending insurance coverage.  Onychomycosis x 10 with pain - Nails x 10 were sharply debrided with sterile nail nippers and a power debriding burr to decrease bulk and length.  Follow-up in 3 months  Marina Desire D. Alenna Russell, DPM, FACFAS [facility]     [address] [place], [place] [address]                Office [phone]  Fax [phone]

## 2024-07-03 ENCOUNTER — Other Ambulatory Visit: Payer: Self-pay | Admitting: Podiatry

## 2024-07-03 ENCOUNTER — Telehealth: Payer: Self-pay | Admitting: Podiatry

## 2024-07-03 MED ORDER — PREGABALIN 75 MG PO CAPS: 75.0000 mg | ORAL_CAPSULE | Freq: Two times a day (BID) | ORAL | 0 refills | Status: DC

## 2024-07-03 NOTE — Progress Notes (Signed)
 Informed by our triage office that the pregabalin Rx yesterday didn't go through successfully to the pharmacy.  It was requested to resend the Rx by Mikel Nett in triage via secure message.  Rx sent today at 4:15pm.    ddm

## 2024-07-03 NOTE — Telephone Encounter (Signed)
 Representative states the script is missing a code and cannot be processed. She mentioned she has already sent a message about this.

## 2024-07-03 NOTE — Telephone Encounter (Signed)
 [facility] called back. I gave them the Dx code that they were needing. Thanks

## 2024-07-04 DIAGNOSIS — N183 Chronic kidney disease, stage 3 unspecified: Secondary | ICD-10-CM | POA: Diagnosis not present

## 2024-07-04 DIAGNOSIS — R0981 Nasal congestion: Secondary | ICD-10-CM | POA: Diagnosis not present

## 2024-07-04 DIAGNOSIS — E1122 Type 2 diabetes mellitus with diabetic chronic kidney disease: Secondary | ICD-10-CM | POA: Diagnosis not present

## 2024-07-04 DIAGNOSIS — R059 Cough, unspecified: Secondary | ICD-10-CM | POA: Diagnosis not present

## 2024-07-07 DIAGNOSIS — Z794 Long term (current) use of insulin: Secondary | ICD-10-CM | POA: Diagnosis not present

## 2024-07-07 DIAGNOSIS — E1142 Type 2 diabetes mellitus with diabetic polyneuropathy: Secondary | ICD-10-CM | POA: Diagnosis not present

## 2024-07-07 DIAGNOSIS — I509 Heart failure, unspecified: Secondary | ICD-10-CM | POA: Diagnosis not present

## 2024-07-18 DIAGNOSIS — E1142 Type 2 diabetes mellitus with diabetic polyneuropathy: Secondary | ICD-10-CM | POA: Diagnosis not present

## 2024-07-30 ENCOUNTER — Ambulatory Visit: Admitting: Podiatry

## 2024-07-30 DIAGNOSIS — R2689 Other abnormalities of gait and mobility: Secondary | ICD-10-CM | POA: Diagnosis not present

## 2024-07-30 DIAGNOSIS — E1142 Type 2 diabetes mellitus with diabetic polyneuropathy: Secondary | ICD-10-CM | POA: Diagnosis not present

## 2024-07-30 DIAGNOSIS — M2042 Other hammer toe(s) (acquired), left foot: Secondary | ICD-10-CM | POA: Diagnosis not present

## 2024-07-30 DIAGNOSIS — M2041 Other hammer toe(s) (acquired), right foot: Secondary | ICD-10-CM | POA: Diagnosis not present

## 2024-07-30 NOTE — Progress Notes (Signed)
 Chief Complaint  Patient presents with   Peripheral Neuropathy    Neuropathy and med check today. Reporting about the same as it has been. She does have a spot on the top of the hallux, from rubbing in shoe gear. She does have hammertoes and the contract badly at night time.  A1c was 6.3 in June, goes in Jan for recheck.  ASA    Discussed the use of AI scribe software for clinical note transcription with the patient, who gave verbal consent to proceed.  History of Present Illness [name] is an 83 year old female who presents with toe curling and neuropathy pain.  She experiences toe curling and pain, particularly at night, with a throbbing sensation in one toe. She uses frankincense cream for relief and takes Lyrica  only when the pain is severe, rather than consistently.  A year ago, she experienced a fall resulting in a rod placement and subsequent balance issues. She feels more stable when barefoot compared to wearing shoes, especially those with foam or non-solid bottoms. She previously used diabetic shoes and was accustomed to walking three to four miles a day before the fall.  She was undergoing physical therapy for balance issues until she became ill three to four weeks ago. She had two more sessions scheduled but has not returned. She is concerned about the pressure on machines used during therapy, especially after having knee replacement surgery.    Past Medical History:  Diagnosis Date   Acute GI bleeding 05/13/2015   Anxiety    Arthritis    Asthma    Atrial fibrillation (HCC) 10/29/2017   Bakers cyst, left    Chronic kidney disease    stage 3   Constipation    Diabetes mellitus    type 2   Dysrhythmia    GERD (gastroesophageal reflux disease)    Heart murmur    History of cellulitis    History of dizziness    History of hiatal hernia    tiny 06/15/2016   Hypertension    Iron  deficiency anemia    Peripheral vascular disease    PONV (postoperative  nausea and vomiting)    Pulmonary nodule    Past Surgical History:  Procedure Laterality Date   APPENDECTOMY  1952   BIOPSY  10/16/2018   Procedure: BIOPSY;  Surgeon: Rosalie Kitchens, MD;  Location: [facility];  Service: Endoscopy;;   BIOPSY  11/26/2019   Procedure: BIOPSY;  Surgeon: Rosalie Kitchens, MD;  Location: [facility];  Service: Endoscopy;;   BIOPSY  07/14/2020   Procedure: BIOPSY;  Surgeon: Donnald Charleston, MD;  Location: [facility];  Service: Endoscopy;;   CARDIOVERSION N/A 09/09/2020   Procedure: CARDIOVERSION;  Surgeon: Delford Maude BROCKS, MD;  Location[facility] ENDOSCOPY;  Service: Cardiovascular;  Laterality: N/A;   CARDIOVERSION N/A 11/18/2020   Procedure: CARDIOVERSION;  Surgeon: Kate Lonni CROME, MD;  Location: [facility] ENDOSCOPY;  Service: Cardiovascular;  Laterality: N/A;   COLONOSCOPY W/ POLYPECTOMY  05/15/2013   ESOPHAGOGASTRODUODENOSCOPY (EGD) WITH PROPOFOL  N/A 06/15/2016   Procedure: ESOPHAGOGASTRODUODENOSCOPY (EGD) WITH PROPOFOL ;  Surgeon: Kitchens Rosalie, MD;  [facility]: [facility];  Service: Endoscopy;  Laterality: N/A;   ESOPHAGOGASTRODUODENOSCOPY (EGD) WITH PROPOFOL  N/A 10/16/2018   Procedure: ESOPHAGOGASTRODUODENOSCOPY (EGD) WITH PROPOFOL ;  Surgeon: Rosalie Kitchens, MD;  Location: [facility];  Service: Endoscopy;  Laterality: N/A;   ESOPHAGOGASTRODUODENOSCOPY (EGD) WITH PROPOFOL  N/A 11/26/2019   Procedure: ESOPHAGOGASTRODUODENOSCOPY (EGD) WITH PROPOFOL ;  Surgeon: Rosalie Kitchens, MD;  [facility]: [facility];  Service: Endoscopy;  Laterality: N/A;  ESOPHAGOGASTRODUODENOSCOPY (EGD) WITH PROPOFOL  N/A 07/14/2020   Procedure: ESOPHAGOGASTRODUODENOSCOPY (EGD) WITH PROPOFOL ;  Surgeon: Donnald Charleston, MD;  [facility]: [facility];  Service: Endoscopy;  Laterality: N/A;   EYE SURGERY Bilateral    cataracts removed   GI RADIOFREQUENCY ABLATION  10/16/2018   Procedure: GI RADIOFREQUENCY ABLATION;  Surgeon: Rosalie Kitchens, MD;  Location: [facility];  Service: Endoscopy;;   GI  RADIOFREQUENCY ABLATION N/A 11/26/2019   Procedure: GI RADIOFREQUENCY ABLATION;  Surgeon: Rosalie Kitchens, MD;  Location: [facility];  Service: Endoscopy;  Laterality: N/A;   HAND SURGERY Left 2010   operated on index and ring finger   HOT HEMOSTASIS N/A 07/14/2020   Procedure: HOT HEMOSTASIS (ARGON PLASMA COAGULATION/BICAP);  Surgeon: Donnald Charleston, MD;  Location: [facility];  Service: Endoscopy;  Laterality: N/A;   INCONTINENCE SURGERY     INTRAMEDULLARY (IM) NAIL INTERTROCHANTERIC Left 06/29/2023   Procedure: INTRAMEDULLARY (IM) NAIL INTERTROCHANTERIC;  Surgeon: Kendal Franky SQUIBB, MD;  Location: [facility];  Service: Orthopedics;  Laterality: Left;   JOINT REPLACEMENT Right    knee   KNEE ARTHROSCOPY  2003 2005   right knee   KYPHOPLASTY N/A 01/29/2020   Procedure: T8 KYPHOPLASTY;  Surgeon: Burnetta Aures, MD;  Location: [facility];  Service: Orthopedics;  Laterality: N/A;  60 mins Local with [facility]   LEFT ATRIAL APPENDAGE OCCLUSION N/A 07/01/2020   Procedure: LEFT ATRIAL APPENDAGE OCCLUSION;  Surgeon: Cindie Ole DASEN, MD;  [facility]: [facility];  Service: Cardiovascular;  Laterality: N/A;   mass fallopian tube     SPINAL CORD STIMULATOR BATTERY EXCHANGE N/A 09/09/2015   Procedure: SPINAL CORD STIMULATOR BATTERY EXCHANGE;  Surgeon: Aures Burnetta, MD;  [facility]: [facility];  Service: Orthopedics;  Laterality: N/A;   SPINAL CORD STIMULATOR BATTERY EXCHANGE N/A 09/08/2021   Procedure: SPINAL CORD STIMULATOR BATTERY EXCHANGE;  Surgeon: Burnetta Aures, MD;  [facility];  Service: Orthopedics;  Laterality: N/A;   TEE WITHOUT CARDIOVERSION N/A 08/18/2020   Procedure: TRANSESOPHAGEAL ECHOCARDIOGRAM (TEE);  Surgeon: Delford Maude BROCKS, MD;  Location: [facility] ENDOSCOPY;  Service: Cardiovascular;  Laterality: N/A;   TEE WITHOUT CARDIOVERSION N/A 09/09/2020   Procedure: TRANSESOPHAGEAL ECHOCARDIOGRAM (TEE);  Surgeon: Delford Maude BROCKS, MD;  Location: [facility] ENDOSCOPY;  Service: Cardiovascular;  Laterality: N/A;    TEE WITHOUT CARDIOVERSION N/A 11/18/2020   Procedure: TRANSESOPHAGEAL ECHOCARDIOGRAM (TEE);  Surgeon: Kate Lonni CROME, MD;  Location: [facility] ENDOSCOPY;  Service: Cardiovascular;  Laterality: N/A;   TOTAL KNEE ARTHROPLASTY Left 11/05/2017   Procedure: LEFT TOTAL KNEE ARTHROPLASTY;  Surgeon: Ernie Cough, MD;  Location: [facility];  Service: Orthopedics;  Laterality: Left;  Adductor Block   TUBAL LIGATION     VAGINAL HYSTERECTOMY  1977   1 ovary removed   Allergies  Allergen Reactions   Vasotec Shortness Of Breath and Other (See Comments)    Wheezing, also   Atorvastatin  Other (See Comments)    Leg cramps    Codeine Nausea And Vomiting   Cymbalta [Duloxetine Hcl] Itching   Lansoprazole Itching, Swelling, Rash and Other (See Comments)    Redness, swelling of mouth. During 07/12/20-07/15/20 pt tolerated IV protonix  infusion with no reactions.   Morphine And Codeine Itching   Sulfate Itching   Amitriptyline  Hcl Other (See Comments)     sleepwalking   Escitalopram Oxalate Itching   Hydrocodone  Itching   Iron  Other (See Comments)    constipated   Sertraline Hcl Itching   Tramadol  Itching   Adhesive [Tape] Rash   Allevyn Adhesive [Wound Dressings] Rash  Scopolamine  Rash   Physical Exam Palpable pedal pulses.  There are flexible contractures, mild in nature, of all the toes at the PIP joints.  This is manually reducible.  No distal corns are noted.  Skin is intact.  Protective sensation intact, light touch sensation diminished bilateral.   Assessment/Plan of Care: 1. Diabetic peripheral neuropathy associated with type 2 diabetes mellitus (HCC)   2. Hammertoe of left foot   3. Hammertoe of right foot   4. Balance problems    Assessment & Plan Hammer toes with contracture Causing toe curling and discomfort, likely due to muscle cramping overnight. Consideration for in-office tendon release procedure to alleviate symptoms. - Offered in-office tendon release procedure for hammer  toes. - Provided toe bumper support for daytime use to decrease joint strain.  Polyneuropathy Symptoms of numbness, burning, and tingling. Currently using frankincense cream with some relief. Lyrica  prescribed but taken inconsistently. Emphasis on consistent daily use to achieve therapeutic levels in the bloodstream. - Instructed to take Lyrica  consistently once daily for three weeks.  It was written for twice daily originally, but at least try to take it daily consistently for the next 3 weeks. - Scheduled follow-up in three weeks to assess medication efficacy. - Will consider alternative medication if Lyrica  is ineffective after consistent use.  (Cymbalta)  Balance impairment Possibly related to footwear and previous physical therapy. Reports better control barefoot than in shoes with foam or elevated heels. Previous physical therapy sessions focused on balance and proprioception training. - Recommended zero drop shoes such as Altra, Doctor Comfort, SAS, or Drew for better balance. - Discussed potential for balance and proprioception training at physical therapy.  She states that she was already going to physical therapy recently and feels like there was not much improvement.     [name], DPM, FACFAS [facility]     [address] [place], [place] [address]                Office [phone]  Fax [phone]

## 2024-08-07 ENCOUNTER — Other Ambulatory Visit: Payer: Self-pay

## 2024-09-10 ENCOUNTER — Ambulatory Visit: Admitting: Podiatry

## 2024-09-18 ENCOUNTER — Ambulatory Visit: Admitting: Podiatry

## 2024-09-18 DIAGNOSIS — M79674 Pain in right toe(s): Secondary | ICD-10-CM

## 2024-09-18 DIAGNOSIS — M79675 Pain in left toe(s): Secondary | ICD-10-CM | POA: Diagnosis not present

## 2024-09-18 DIAGNOSIS — M2031 Hallux varus (acquired), right foot: Secondary | ICD-10-CM | POA: Diagnosis not present

## 2024-09-18 DIAGNOSIS — B351 Tinea unguium: Secondary | ICD-10-CM

## 2024-09-18 DIAGNOSIS — E1142 Type 2 diabetes mellitus with diabetic polyneuropathy: Secondary | ICD-10-CM | POA: Diagnosis not present

## 2024-09-18 MED ORDER — PREGABALIN 75 MG PO CAPS: 75.0000 mg | ORAL_CAPSULE | Freq: Two times a day (BID) | ORAL | 3 refills | Status: AC

## 2024-09-18 NOTE — Progress Notes (Signed)
 "     Subjective:  Patient ID: [name], female    DOB: [birth year],  MRN: [id number]  [name] presents to clinic today for:  Chief Complaint  Patient presents with   [facility]    [facility] no callus. Lyrica  has helped some.  A1c 6.6 in Jan ASA   Patient notes nails are thick, discolored, elongated and painful in shoegear when trying to ambulate.  She is requesting a renewal of her pregabalin .  She also has concern of a prominent knuckle on her right great toe that has been rubbing in her shoes.  She is wondering if there is anything she can do for this.  PCP is Charlott Dorn LABOR, MD.  Past Medical History:  Diagnosis Date   Acute GI bleeding 05/13/2015   Anxiety    Arthritis    Asthma    Atrial fibrillation (HCC) 10/29/2017   Bakers cyst, left    Chronic kidney disease    stage 3   Constipation    Diabetes mellitus    type 2   Dysrhythmia    GERD (gastroesophageal reflux disease)    Heart murmur    History of cellulitis    History of dizziness    History of hiatal hernia    tiny 06/15/2016   Hypertension    Iron  deficiency anemia    Peripheral vascular disease    PONV (postoperative nausea and vomiting)    Pulmonary nodule    Past Surgical History:  Procedure Laterality Date   APPENDECTOMY  1952   BIOPSY  10/16/2018   Procedure: BIOPSY;  Surgeon: Rosalie Kitchens, MD;  Location: [facility];  Service: Endoscopy;;   BIOPSY  11/26/2019   Procedure: BIOPSY;  Surgeon: Rosalie Kitchens, MD;  Location: [facility];  Service: Endoscopy;;   BIOPSY  07/14/2020   Procedure: BIOPSY;  Surgeon: Donnald Charleston, MD;  Location: [facility];  Service: Endoscopy;;   CARDIOVERSION N/A 09/09/2020   Procedure: CARDIOVERSION;  Surgeon: Delford Maude BROCKS, MD;  Location: [facility] ENDOSCOPY;  Service: Cardiovascular;  Laterality: N/A;   CARDIOVERSION N/A 11/18/2020   Procedure: CARDIOVERSION;  Surgeon: Kate Lonni CROME, MD;  Location: [facility] ENDOSCOPY;  Service: Cardiovascular;  Laterality:  N/A;   COLONOSCOPY W/ POLYPECTOMY  05/15/2013   ESOPHAGOGASTRODUODENOSCOPY (EGD) WITH PROPOFOL  N/A 06/15/2016   Procedure: ESOPHAGOGASTRODUODENOSCOPY (EGD) WITH PROPOFOL ;  Surgeon: Kitchens Rosalie, MD;  [facility];  Service: Endoscopy;  Laterality: N/A;   ESOPHAGOGASTRODUODENOSCOPY (EGD) WITH PROPOFOL  N/A 10/16/2018   Procedure: ESOPHAGOGASTRODUODENOSCOPY (EGD) WITH PROPOFOL ;  Surgeon: Rosalie Kitchens, MD;  Location: [facility];  Service: Endoscopy;  Laterality: N/A;   ESOPHAGOGASTRODUODENOSCOPY (EGD) WITH PROPOFOL  N/A 11/26/2019   Procedure: ESOPHAGOGASTRODUODENOSCOPY (EGD) WITH PROPOFOL ;  Surgeon: Rosalie Kitchens, MD;  [facility]: [facility];  Service: Endoscopy;  Laterality: N/A;   ESOPHAGOGASTRODUODENOSCOPY (EGD) WITH PROPOFOL  N/A 07/14/2020   Procedure: ESOPHAGOGASTRODUODENOSCOPY (EGD) WITH PROPOFOL ;  Surgeon: Donnald Charleston, MD;  Location: [facility];  Service: Endoscopy;  Laterality: N/A;   EYE SURGERY Bilateral    cataracts removed   GI RADIOFREQUENCY ABLATION  10/16/2018   Procedure: GI RADIOFREQUENCY ABLATION;  Surgeon: Rosalie Kitchens, MD;  Location: [facility];  Service: Endoscopy;;   GI RADIOFREQUENCY ABLATION N/A 11/26/2019   Procedure: GI RADIOFREQUENCY ABLATION;  Surgeon: Rosalie Kitchens, MD;  Location: [facility];  Service: Endoscopy;  Laterality: N/A;   HAND SURGERY Left 2010   operated on index and ring finger   HOT HEMOSTASIS N/A 07/14/2020   Procedure: HOT HEMOSTASIS (ARGON PLASMA COAGULATION/BICAP);  Surgeon: Buccini,  Lamar, MD;  Location: [facility];  Service: Endoscopy;  Laterality: N/A;   INCONTINENCE SURGERY     INTRAMEDULLARY (IM) NAIL INTERTROCHANTERIC Left 06/29/2023   Procedure: INTRAMEDULLARY (IM) NAIL INTERTROCHANTERIC;  Surgeon: Kendal Franky SQUIBB, MD;  Location: [facility];  Service: Orthopedics;  Laterality: Left;   JOINT REPLACEMENT Right    knee   KNEE ARTHROSCOPY  2003 2005   right knee   KYPHOPLASTY N/A 01/29/2020   Procedure: T8 KYPHOPLASTY;  Surgeon:  Burnetta Aures, MD;  Location: [facility];  Service: Orthopedics;  Laterality: N/A;  60 mins Local with [facility]   LEFT ATRIAL APPENDAGE OCCLUSION N/A 07/01/2020   Procedure: LEFT ATRIAL APPENDAGE OCCLUSION;  Surgeon: Cindie Ole DASEN, MD;  [facility]: [facility];  Service: Cardiovascular;  Laterality: N/A;   mass fallopian tube     SPINAL CORD STIMULATOR BATTERY EXCHANGE N/A 09/09/2015   Procedure: SPINAL CORD STIMULATOR BATTERY EXCHANGE;  Surgeon: Aures Burnetta, MD;  Location: [facility];  Service: Orthopedics;  Laterality: N/A;   SPINAL CORD STIMULATOR BATTERY EXCHANGE N/A 09/08/2021   Procedure: SPINAL CORD STIMULATOR BATTERY EXCHANGE;  Surgeon: Burnetta Aures, MD;  Location: [facility];  Service: Orthopedics;  Laterality: N/A;   TEE WITHOUT CARDIOVERSION N/A 08/18/2020   Procedure: TRANSESOPHAGEAL ECHOCARDIOGRAM (TEE);  Surgeon: Delford Maude BROCKS, MD;  Location: [facility] ENDOSCOPY;  Service: Cardiovascular;  Laterality: N/A;   TEE WITHOUT CARDIOVERSION N/A 09/09/2020   Procedure: TRANSESOPHAGEAL ECHOCARDIOGRAM (TEE);  Surgeon: Delford Maude BROCKS, MD;  Location: [facility] ENDOSCOPY;  Service: Cardiovascular;  Laterality: N/A;   TEE WITHOUT CARDIOVERSION N/A 11/18/2020   Procedure: TRANSESOPHAGEAL ECHOCARDIOGRAM (TEE);  Surgeon: Kate Lonni CROME, MD;  Location: [facility] ENDOSCOPY;  Service: Cardiovascular;  Laterality: N/A;   TOTAL KNEE ARTHROPLASTY Left 11/05/2017   Procedure: LEFT TOTAL KNEE ARTHROPLASTY;  Surgeon: Ernie Cough, MD;  Location: [facility];  Service: Orthopedics;  Laterality: Left;  Adductor Block   TUBAL LIGATION     VAGINAL HYSTERECTOMY  1977   1 ovary removed   Allergies[1]  Review of Systems: Negative except as noted in the HPI.  Objective:  [name] is a pleasant 84 y.o. female in NAD. AAO x 3.  Vascular Examination: Capillary refill time is 3-5 seconds to toes bilateral. Palpable pedal pulses b/l LE. Digital hair present b/l.  Skin temperature gradient WNL b/l. No varicosities  b/l. No cyanosis noted b/l.   Dermatological Examination: Pedal skin with normal turgor, texture and tone b/l. No open wounds. No interdigital macerations b/l. Toenails x10 are 3mm thick, discolored, dystrophic with subungual debris. There is pain with compression of the nail plates.  They are elongated x10  Orthopedic examination: There is a flexible contracture of the right hallux at the IPJ consistent with hallux malleus deformity.  Assessment/Plan: 1. Diabetic peripheral neuropathy associated with type 2 diabetes mellitus (HCC)   2. Pain due to onychomycosis of toenails of both feet   3. Hallux malleus of right foot     Meds ordered this encounter  Medications   pregabalin  (LYRICA ) 75 MG capsule    Sig: Take 1 capsule (75 mg total) by mouth 2 (two) times daily. Stop taking your gabapentin  when you take this medication    Dispense:  180 capsule    Refill:  3   Patient's pregabalin  75 mg twice daily was sent to the pharmacy.  After the medication was sent, the patient states that she actually only takes this at bedtime.  She states it has been helpful.  The mycotic  toenails were sharply debrided x10 with sterile nail nippers and a power debriding burr to decrease bulk/thickness and length.    Patient was fitted for a toe alignment splint to wear on the right great toe at bedtime to help stretch the extensor hallucis longus tendon.  Hopefully this can bring the knuckle/IPJ down so that it does not continue to rub in her shoes.  If this is not effective and she has continuation of discomfort, even with shoe gear accommodation to a deeper toebox, may need to recommend surgical intervention.  Return in about 3 months (around 12/17/2024) for [facility].   Awanda CHARM Imperial, DPM, FACFAS [facility]     [address] [place] [address]                Office [phone]  Fax [phone]    [1]  Allergies Allergen Reactions    Vasotec Shortness Of Breath and Other (See Comments)    Wheezing, also   Atorvastatin  Other (See Comments)    Leg cramps    Codeine Nausea And Vomiting   Cymbalta [Duloxetine Hcl] Itching   Lansoprazole Itching, Swelling, Rash and Other (See Comments)    Redness, swelling of mouth. During 07/12/20-07/15/20 pt tolerated IV protonix  infusion with no reactions.   Morphine And Codeine Itching   Sulfate Itching   Amitriptyline  Hcl Other (See Comments)     sleepwalking   Escitalopram Oxalate Itching   Hydrocodone  Itching   Iron  Other (See Comments)    constipated   Sertraline Hcl Itching   Tramadol  Itching   Adhesive [Tape] Rash   Allevyn Adhesive [Wound Dressings] Rash   Scopolamine  Rash   "

## 2024-11-12 ENCOUNTER — Ambulatory Visit: Admitting: Cardiovascular Disease

## 2024-12-18 ENCOUNTER — Ambulatory Visit: Admitting: Podiatry

## 2024-12-30 ENCOUNTER — Ambulatory Visit: Admitting: Cardiology
